# Patient Record
Sex: Male | Born: 1960 | Race: White | Hispanic: No | Marital: Single | Smoking: Never smoker
Health system: Southern US, Community
[De-identification: ages and names within clinical notes are randomized; demographics above are authoritative.]

## PROBLEM LIST (undated history)

## (undated) DIAGNOSIS — J690 Pneumonitis due to inhalation of food and vomit: Secondary | ICD-10-CM

## (undated) DIAGNOSIS — E1121 Type 2 diabetes mellitus with diabetic nephropathy: Secondary | ICD-10-CM

## (undated) DIAGNOSIS — E669 Obesity, unspecified: Secondary | ICD-10-CM

## (undated) DIAGNOSIS — I4901 Ventricular fibrillation: Secondary | ICD-10-CM

## (undated) DIAGNOSIS — E1140 Type 2 diabetes mellitus with diabetic neuropathy, unspecified: Secondary | ICD-10-CM

## (undated) DIAGNOSIS — G4733 Obstructive sleep apnea (adult) (pediatric): Secondary | ICD-10-CM

## (undated) DIAGNOSIS — E1149 Type 2 diabetes mellitus with other diabetic neurological complication: Secondary | ICD-10-CM

## (undated) DIAGNOSIS — R609 Edema, unspecified: Secondary | ICD-10-CM

## (undated) DIAGNOSIS — Z8619 Personal history of other infectious and parasitic diseases: Secondary | ICD-10-CM

## (undated) DIAGNOSIS — E11621 Type 2 diabetes mellitus with foot ulcer: Secondary | ICD-10-CM

## (undated) DIAGNOSIS — Z8719 Personal history of other diseases of the digestive system: Secondary | ICD-10-CM

## (undated) DIAGNOSIS — M869 Osteomyelitis, unspecified: Secondary | ICD-10-CM

## (undated) DIAGNOSIS — G629 Polyneuropathy, unspecified: Secondary | ICD-10-CM

## (undated) DIAGNOSIS — N183 Chronic kidney disease, stage 3 unspecified: Secondary | ICD-10-CM

## (undated) DIAGNOSIS — D696 Thrombocytopenia, unspecified: Secondary | ICD-10-CM

## (undated) DIAGNOSIS — K7581 Nonalcoholic steatohepatitis (NASH): Secondary | ICD-10-CM

## (undated) DIAGNOSIS — I255 Ischemic cardiomyopathy: Secondary | ICD-10-CM

## (undated) DIAGNOSIS — K766 Portal hypertension: Secondary | ICD-10-CM

## (undated) DIAGNOSIS — E11319 Type 2 diabetes mellitus with unspecified diabetic retinopathy without macular edema: Secondary | ICD-10-CM

## (undated) DIAGNOSIS — R600 Localized edema: Secondary | ICD-10-CM

## (undated) DIAGNOSIS — E875 Hyperkalemia: Secondary | ICD-10-CM

## (undated) DIAGNOSIS — K7460 Unspecified cirrhosis of liver: Secondary | ICD-10-CM

## (undated) DIAGNOSIS — K631 Perforation of intestine (nontraumatic): Secondary | ICD-10-CM

## (undated) DIAGNOSIS — R011 Cardiac murmur, unspecified: Secondary | ICD-10-CM

## (undated) DIAGNOSIS — L97509 Non-pressure chronic ulcer of other part of unspecified foot with unspecified severity: Secondary | ICD-10-CM

## (undated) DIAGNOSIS — Z87448 Personal history of other diseases of urinary system: Secondary | ICD-10-CM

## (undated) DIAGNOSIS — M47816 Spondylosis without myelopathy or radiculopathy, lumbar region: Secondary | ICD-10-CM

## (undated) DIAGNOSIS — Z972 Presence of dental prosthetic device (complete) (partial): Secondary | ICD-10-CM

## (undated) HISTORY — DX: Ventricular fibrillation: I49.01

## (undated) HISTORY — DX: Polyneuropathy, unspecified: G62.9

## (undated) HISTORY — DX: Edema, unspecified: R60.9

## (undated) HISTORY — DX: Type 2 diabetes mellitus with foot ulcer: E11.621

## (undated) HISTORY — DX: Type 2 diabetes mellitus with unspecified diabetic retinopathy without macular edema: E11.319

## (undated) HISTORY — DX: Localized edema: R60.0

## (undated) HISTORY — DX: Type 2 diabetes mellitus with diabetic nephropathy: E11.21

## (undated) HISTORY — DX: Cardiac murmur, unspecified: R01.1

## (undated) HISTORY — DX: Nonalcoholic steatohepatitis (NASH): K75.81

## (undated) HISTORY — DX: Ischemic cardiomyopathy: I25.5

## (undated) HISTORY — DX: Perforation of intestine (nontraumatic): K63.1

## (undated) HISTORY — DX: Non-pressure chronic ulcer of other part of unspecified foot with unspecified severity: L97.509

## (undated) HISTORY — DX: Osteomyelitis, unspecified: M86.9

## (undated) HISTORY — DX: Personal history of other infectious and parasitic diseases: Z86.19

## (undated) HISTORY — DX: Type 2 diabetes mellitus with other diabetic neurological complication: E11.49

## (undated) HISTORY — DX: Hyperkalemia: E87.5

## (undated) HISTORY — PX: TRANSTHORACIC ECHOCARDIOGRAM: SHX275

## (undated) HISTORY — DX: Thrombocytopenia, unspecified: D69.6

## (undated) HISTORY — DX: Unspecified cirrhosis of liver: K74.60

## (undated) HISTORY — DX: Chronic kidney disease, stage 3 (moderate): N18.3

## (undated) HISTORY — DX: Spondylosis without myelopathy or radiculopathy, lumbar region: M47.816

## (undated) HISTORY — DX: Type 2 diabetes mellitus with diabetic neuropathy, unspecified: E11.40

## (undated) HISTORY — DX: Chronic kidney disease, stage 3 unspecified: N18.30

## (undated) HISTORY — DX: Personal history of other diseases of the digestive system: Z87.19

## (undated) HISTORY — DX: Obesity, unspecified: E66.9

## (undated) HISTORY — DX: Pneumonitis due to inhalation of food and vomit: J69.0

## (undated) HISTORY — DX: Portal hypertension: K76.6

## (undated) HISTORY — DX: Obstructive sleep apnea (adult) (pediatric): G47.33

---

## 1999-12-20 HISTORY — PX: KNEE SURGERY: SHX244

## 1999-12-20 HISTORY — PX: KNEE ARTHROSCOPY: SHX127

## 2011-04-27 ENCOUNTER — Emergency Department (HOSPITAL_COMMUNITY)
Admission: EM | Admit: 2011-04-27 | Discharge: 2011-04-27 | Disposition: A | Discharge status: Home / Self Care | Payer: Worker's Compensation | Attending: Emergency Medicine | Admitting: Emergency Medicine

## 2011-04-27 ENCOUNTER — Emergency Department (HOSPITAL_COMMUNITY): Payer: Worker's Compensation

## 2011-04-27 DIAGNOSIS — M545 Low back pain, unspecified: Secondary | ICD-10-CM | POA: Insufficient documentation

## 2011-04-27 DIAGNOSIS — W010XXA Fall on same level from slipping, tripping and stumbling without subsequent striking against object, initial encounter: Secondary | ICD-10-CM | POA: Insufficient documentation

## 2011-04-27 DIAGNOSIS — Y9289 Other specified places as the place of occurrence of the external cause: Secondary | ICD-10-CM | POA: Insufficient documentation

## 2011-04-27 DIAGNOSIS — S335XXA Sprain of ligaments of lumbar spine, initial encounter: Secondary | ICD-10-CM | POA: Insufficient documentation

## 2011-06-30 ENCOUNTER — Ambulatory Visit (INDEPENDENT_AMBULATORY_CARE_PROVIDER_SITE_OTHER): Payer: Private Health Insurance - Indemnity | Admitting: Family Medicine

## 2011-06-30 ENCOUNTER — Encounter: Payer: Self-pay | Admitting: Family Medicine

## 2011-06-30 VITALS — BP 185/93 | HR 74 | Ht 70.75 in | Wt 193.0 lb

## 2011-06-30 DIAGNOSIS — S39012A Strain of muscle, fascia and tendon of lower back, initial encounter: Secondary | ICD-10-CM | POA: Insufficient documentation

## 2011-06-30 DIAGNOSIS — R011 Cardiac murmur, unspecified: Secondary | ICD-10-CM

## 2011-06-30 DIAGNOSIS — I10 Essential (primary) hypertension: Secondary | ICD-10-CM

## 2011-06-30 DIAGNOSIS — S335XXA Sprain of ligaments of lumbar spine, initial encounter: Secondary | ICD-10-CM

## 2011-06-30 LAB — COMPREHENSIVE METABOLIC PANEL
BUN: 13 mg/dL (ref 6–23)
CO2: 28 mEq/L (ref 19–32)
Creat: 0.58 mg/dL (ref 0.50–1.35)
Glucose, Bld: 365 mg/dL — ABNORMAL HIGH (ref 70–99)
Total Bilirubin: 1.3 mg/dL — ABNORMAL HIGH (ref 0.3–1.2)
Total Protein: 6.5 g/dL (ref 6.0–8.3)

## 2011-06-30 LAB — LIPID PANEL
HDL: 62 mg/dL (ref >39)
Total CHOL/HDL Ratio: 2.9 Ratio
Triglycerides: 58 mg/dL (ref <150)

## 2011-06-30 LAB — TSH: TSH: 2.987 u[IU]/mL (ref 0.350–4.500)

## 2011-06-30 MED ORDER — NEBIVOLOL HCL 10 MG PO TABS: 10.0000 mg | ORAL_TABLET | Freq: Every day | ORAL | Status: DC

## 2011-06-30 NOTE — Patient Instructions (Signed)
BP goal is <140/90.

## 2011-06-30 NOTE — Assessment & Plan Note (Signed)
Minimal impairment currently, wants to go back to work.   I wrote a letter authorizing return to work w/out restriction today.   I did recommend he stop his NSAIDs and use tylenol only to see if his bp gets better off these meds. I recommended more PT and ordered this today.

## 2011-06-30 NOTE — Assessment & Plan Note (Addendum)
BP 164/92 on recheck (both arms) today.   Lipid panel through employer 2 mo ago was normal. Possibly some NSAID effect plus anxiety.  I recommended he get home bp cuff, check bp over the next 2d ---stop NSAIDs now--and if still up in 2d I recommended He start the bystolic 10mg  samples I gave him today: 1 qd.  Bring bp log in to f/u appt in 2 wks.  Therapeutic expectations and side effect profile of medication discussed today.  Patient's questions answered.

## 2011-06-30 NOTE — Progress Notes (Signed)
Office Note 06/30/2011  CC:  Chief Complaint  Patient presents with  . Back Pain    fell on 04/27/11, still having pain.  Released to full duty on 06/05/11 and has been unable to perform normal duties.  Pt is on leave from work until released back to work from PCP    HPI:  [name] is a 50 y.o. White male who is here to establish care and discuss back pain. Patient's most recent primary MD: none. Old records from [facility] ED visit 04/27/11 were reviewed prior to or during today's visit.  Also reviewed some health/screening info done through his employer a couple of months ago.  Here for back pain.  Was sent home by employer and told to go to MD about his back before coming back to work. Apparently fell while at work 04/27/11 and hurt back, went to ED for eval and x-ray confirmed some chronic changes c/w degenerative arthritis but no acute problems. He was rx'd vicodin and flexeril short term: these caused too much GI upset so he d/c'd them.  Switched to naproxen alt/w advil: 3-4 doses total per day.  Went to PT for about a month and was released to work without restrictions in mid/late June.  Since being back at work he has been doing fine, tolerating a low amount of pain and having no problems doing his usual exertional/physical labor.  This was the case until a few days ago when he felt a low back strain again from lifting some heavy things while at work.  He continued to work but let his employer know that he was having some pain again----they told him to get seen by MD so he is here today. No history of back surgery or injections.  He remembers some brief low back pain in 2000-2001 time range when he got an x-ray or MRI and was told he had a couple of bulging discs and some arthritis.  He didn't have any problems from his back since that time, however, until his fall at work 04/27/11.  Denies hx of CAD, lung dz, liver dz, or renal dz. No hx of depression.  Denies past hx of HTN.  Past Medical  History  Diagnosis Date  . Low back pain     Plain film 04/2011: spondylosis/old osteophyte vs endplate fx L3    Past Surgical History  Procedure Date  . Knee arthroscopy     Bilateral    History reviewed. No pertinent family history.  History   Social History  . Marital Status: Single    Spouse Name: N/A    Number of Children: N/A  . Years of Education: N/A   Occupational History  . Not on file.   Social History Main Topics  . Smoking status: Never Smoker   . Smokeless tobacco: Current User    Types: Chew  . Alcohol Use: 6.0 oz/week    10 Cans of beer per week  . Drug Use: No  . Sexually Active: Not on file   Other Topics Concern  . Not on file   Social History Narrative   Single, no children.  Lives in [place], relocated from [place], [place] in 2008.Chews tobacco but does not smoke.Drinks 1-2 beers most days.  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    Outpatient Encounter Prescriptions as of 06/30/2011  Medication Sig Dispense Refill  . Multiple Vitamins-Minerals (MULTI FOR HIM) PACK Take 1 each by mouth  daily.        . naproxen (NAPROSYN) 250 MG tablet Take 250 mg by mouth 2 (two) times daily with a meal.        . nebivolol (BYSTOLIC) 10 MG tablet Take 1 tablet (10 mg total) by mouth daily.  21 tablet  0  *PATIENT IS NOT ON BYSTOLIC (THIS WAS STARTED AT THE END OF THIS ENCOUNTER)  No Known Allergies  ROS Review of Systems  Constitutional: Negative for fever, chills, appetite change and fatigue.  HENT: Negative for ear pain, congestion, sore throat, neck stiffness and dental problem.   Eyes: Negative for discharge, redness and visual disturbance.  Respiratory: Negative for cough, chest tightness, shortness of breath and wheezing.   Cardiovascular: Negative for chest pain, palpitations and leg swelling.  Gastrointestinal: Negative for nausea, vomiting, abdominal pain, diarrhea and blood in stool.    Genitourinary: Negative for dysuria, urgency, frequency, hematuria, flank pain and difficulty urinating.  Musculoskeletal: Positive for back pain. Negative for myalgias, joint swelling and arthralgias.  Skin: Negative for pallor and rash.  Neurological: Negative for dizziness, speech difficulty, weakness and headaches.  Hematological: Negative for adenopathy. Does not bruise/bleed easily.  Psychiatric/Behavioral: Negative for confusion and sleep disturbance. The patient is not nervous/anxious.     PE; Blood pressure 185/93, pulse 74, height 5' 10.75" (1.797 m), weight 193 lb (87.544 kg), SpO2 100.00%. Gen: Alert, well appearing.  Patient is oriented to person, place, time, and situation. HEENT: Scalp without lesions or hair loss.  Ears: EACs clear, normal epithelium.  TMs with good light reflex and landmarks bilaterally.  Eyes: no injection, icteris, swelling, or exudate.  EOMI, PERRLA. Nose: no drainage or turbinate edema/swelling.  No injection or focal lesion.  Mouth: lips without lesion/swelling.  Oral mucosa pink and moist.  Dentition intact and without obvious caries or gingival swelling.  Oropharynx without erythema, exudate, or swelling.  Neck: supple, ROM full.  Carotids 2+ bilat, without bruit.  No lymphadenopathy, thyromegaly, or mass. Chest: symmetric expansion, nonlabored respirations.  Clear and equal breath sounds in all lung fields.   CV: RRR, 1/6 diastolic murmur at LUSB, S1 and S2 normal.  No rub or gallop.2  Peripheral pulses 2+ and symmetric. ABD: soft, NT, ND, BS normal.  No hepatospenomegaly or mass.  No bruits. EXT: no clubbing or cyanosis.  Both lower legs with 1 + pitting edema to mid tibia level plus pigmentation and hyperkeratosis c/w chronic venous stasis edema.  No erythema, tenderness, or skin breakdown.  Pertinent labs:  12 lead EKG: NSR, rate 76, poor R wave progression, rightward axis, intraventric cond delay.  No ischemic changes. No old EKG for  comparison.  ASSESSMENT AND PLAN:   Low back strain Minimal impairment currently, wants to go back to work.   I wrote a letter authorizing return to work w/out restriction today.   I did recommend he stop his NSAIDs and use tylenol only to see if his bp gets better off these meds. I recommended more PT and ordered this today.  HTN (hypertension), benign BP 164/92 on recheck (both arms) today.   Lipid panel through employer 2 mo ago was normal. Possibly some NSAID effect plus anxiety.  I recommended he get home bp cuff, check bp over the next 2d ---stop NSAIDs now--and if still up in 2d I recommended He start the bystolic 10mg  samples I gave him today: 1 qd.  Bring bp log in to f/u appt in 2 wks.  Therapeutic expectations and side effect profile  of medication discussed today.  Patient's questions answered.   Heart murmur Very soft diastolic murmur. EKG abnl but no ischemic changes. Will start with 2-D echo and will likely refer to cardiology after this. Discuss again with pt at f/u in 2 wks.   Will obtain fasting CBC, CMET, TSH, and lipid panel today.  Return in about 2 weeks (around 07/14/2011) for f/u back pain, hypertension, and heart murmur.

## 2011-06-30 NOTE — Assessment & Plan Note (Signed)
Very soft diastolic murmur. EKG abnl but no ischemic changes. Will start with 2-D echo and will likely refer to cardiology after this. Discuss again with pt at f/u in 2 wks.

## 2011-07-01 ENCOUNTER — Encounter: Payer: Self-pay | Admitting: Family Medicine

## 2011-07-01 LAB — CBC WITH DIFFERENTIAL/PLATELET
Eosinophils Relative: 1 % (ref 0–5)
HCT: 44.5 % (ref 39.0–52.0)
Hemoglobin: 15.6 g/dL (ref 13.0–17.0)
Lymphocytes Relative: 16 % (ref 12–46)
Lymphs Abs: 0.7 10*3/uL (ref 0.7–4.0)
MCV: 88.1 fL (ref 78.0–100.0)
Monocytes Relative: 7 % (ref 3–12)
Platelets: 65 10*3/uL — ABNORMAL LOW (ref 150–400)
RBC: 5.05 MIL/uL (ref 4.22–5.81)
WBC: 4.5 10*3/uL (ref 4.0–10.5)

## 2011-07-12 ENCOUNTER — Other Ambulatory Visit: Payer: Self-pay | Admitting: Family Medicine

## 2011-07-12 DIAGNOSIS — D696 Thrombocytopenia, unspecified: Secondary | ICD-10-CM

## 2011-07-12 DIAGNOSIS — R739 Hyperglycemia, unspecified: Secondary | ICD-10-CM

## 2011-07-12 NOTE — Progress Notes (Signed)
OK.  Orders for labs are in now.

## 2011-07-12 NOTE — Progress Notes (Signed)
Addended by: Luisa Dago on: 07/12/2011 08:52 AM   Modules accepted: Orders

## 2011-07-14 ENCOUNTER — Encounter: Payer: Self-pay | Admitting: Family Medicine

## 2011-07-14 ENCOUNTER — Ambulatory Visit (INDEPENDENT_AMBULATORY_CARE_PROVIDER_SITE_OTHER): Payer: Worker's Compensation | Admitting: Family Medicine

## 2011-07-14 DIAGNOSIS — R011 Cardiac murmur, unspecified: Secondary | ICD-10-CM

## 2011-07-14 DIAGNOSIS — I10 Essential (primary) hypertension: Secondary | ICD-10-CM

## 2011-07-14 DIAGNOSIS — S335XXA Sprain of ligaments of lumbar spine, initial encounter: Secondary | ICD-10-CM

## 2011-07-14 DIAGNOSIS — IMO0001 Reserved for inherently not codable concepts without codable children: Secondary | ICD-10-CM

## 2011-07-14 DIAGNOSIS — D696 Thrombocytopenia, unspecified: Secondary | ICD-10-CM

## 2011-07-14 LAB — CBC WITH DIFFERENTIAL/PLATELET
Basophils Relative: 1.4 % (ref 0.0–3.0)
Hemoglobin: 15.1 g/dL (ref 13.0–17.0)
Lymphocytes Relative: 16 % (ref 12.0–46.0)
Monocytes Relative: 9.5 % (ref 3.0–12.0)
Neutro Abs: 3.2 10*3/uL (ref 1.4–7.7)
RBC: 4.81 Mil/uL (ref 4.22–5.81)

## 2011-07-14 MED ORDER — PIOGLITAZONE HCL-METFORMIN HCL 15-500 MG PO TABS: 1.0000 | ORAL_TABLET | Freq: Two times a day (BID) | ORAL | Status: DC

## 2011-07-14 MED ORDER — GLUCOSE BLOOD VI STRP: ORAL_STRIP | Status: DC

## 2011-07-14 MED ORDER — FREESTYLE LANCETS MISC: Status: DC

## 2011-07-14 NOTE — Progress Notes (Addendum)
OFFICE NOTE  07/14/2011  CC:  Chief Complaint  Patient presents with  . Back Pain  . Hypertension  . Heart Murmur     HPI:   Patient is [name] who is here for f/u low back pain, HTN, and recently detected hyperglycemia and thrombocytopenia on routine lab work.  Also, we have ordered an echocardiogram to assess a diastolic murmur and he has not had this procedure yet. BP checks at home with wrist cuff showed gradually decreasing bps after he stopped taking NSAIDs, and since about 1 week ago all checks have been <140/90.  He did not start the bystolic samples I gave him. Back pain stable, mild.  He is working without difficulty and is taking part in PT.  Takes tylenol prn for pain.  When I brought up his high glucose from 7/12 labs, he admits he was dx'd with DM 2 in his late 19s when he was obese.  He was put on meds for a while and he lost a significant amount of weight.  He then stopped the meds and monitors glucoses at home infrequently.  Last one he recalls was 12/2010 and it was 189.  Also, lab work via his employer (reviewed today in office) showed at HbA1c of 11.3%.  He did not act on this and was told nothing specific from the agency that did his testing.  Regarding low platelet count on labs 06/30/11, he denies any known past hx of this.  No known hx of liver or splenic enlargement/congestion.  Overall, he talks a lot today about being mentally fatigued lately b/c of a lot of life situations lately: family visiting lately, some job troubles stressing him out, sleep has been poor, he's worried about his health.  Denies depressed mood. ROS: no polyuria or polydipsia, no CP or SOB, no leg pain or weakness or tingling/numbness, no HAs or palpitations.  Pertinent PMH:  HTN--seems to have been NSAID-induced.  Also has white-coat component. Low back pain/strain Diastolic heart murmur. DM 2, poor control  MEDS;  NOTE: not taking bystolic Outpatient Prescriptions Prior  to Visit  Medication Sig Dispense Refill  . Multiple Vitamins-Minerals (MULTI FOR HIM) PACK Take 1 each by mouth daily.        . naproxen (NAPROSYN) 250 MG tablet Take 250 mg by mouth 2 (two) times daily with a meal.        . nebivolol (BYSTOLIC) 10 MG tablet Take 1 tablet (10 mg total) by mouth daily.  21 tablet  0    PE: Blood pressure 162/82, pulse 68, height 5' 10.75" (1.797 m), weight 191 lb (86.637 kg). Gen: Alert, well appearing.  Patient is oriented to person, place, time, and situation. No further exam today.  Recent labs:  Lab Results  Component Value Date   WBC 4.5 06/30/2011   HGB 15.6 06/30/2011   HCT 44.5 06/30/2011   MCV 88.1 06/30/2011   PLT 65* 06/30/2011     Chemistry      Component Value Date/Time   NA 137 06/30/2011 0932   K 4.4 06/30/2011 0932   CL 98 06/30/2011 0932   CO2 28 06/30/2011 0932   BUN 13 06/30/2011 0932   CREATININE 0.58 06/30/2011 0932      Component Value Date/Time   CALCIUM 9.1 06/30/2011 0932   ALKPHOS 179* 06/30/2011 0932   AST 32 06/30/2011 0932   ALT 39 06/30/2011 0932   BILITOT 1.3* 06/30/2011 0932     Glucose 06/30/11 was >  350.  IMPRESSION AND PLAN:  HTN (hypertension), benign Normalized off of NSAIDs. Avoid NSAIDs, continue to monitor bp at home and call if persistently >140/90.  Low back strain Stable.  Continue tylenol and PT.  Type II or unspecified type diabetes mellitus without mention of complication, uncontrolled Check HbA1c today. Start actos plus met 500/15, 1 tab bid--samples given today.  Therapeutic expectations and side effect profile of medication discussed today.  Patient's questions answered. Monitor glucose fasting daily.  New glucometer given today, strips and lancets ordered. Will do urine microalb next visit, plus refer for diab retinop screening and do foot exam.  Thrombocytopenia New, unknown etiology. No culprit meds.  No recent acute illness.  No bleeding. Will repeat CBC today and will order abd u/s to  assess liver and spleen. May ultimately need referral to hem/onc.  Heart murmur Echo ordered 06/30/11 but apparently fell through the cracks and will be set up ASAP.     FOLLOW UP:  Return in about 2 weeks (around 07/28/2011) for f/u dm 2 and low platelets.

## 2011-07-14 NOTE — Assessment & Plan Note (Signed)
Check HbA1c today. Start actos plus met 500/15, 1 tab bid--samples given today.  Therapeutic expectations and side effect profile of medication discussed today.  Patient's questions answered. Monitor glucose fasting daily.  New glucometer given today, strips and lancets ordered. Will do urine microalb next visit, plus refer for diab retinop screening and do foot exam.

## 2011-07-14 NOTE — Assessment & Plan Note (Signed)
Echo ordered 06/30/11 but apparently fell through the cracks and will be set up ASAP.

## 2011-07-14 NOTE — Assessment & Plan Note (Signed)
Normalized off of NSAIDs. Avoid NSAIDs, continue to monitor bp at home and call if persistently >140/90.

## 2011-07-14 NOTE — Assessment & Plan Note (Signed)
New, unknown etiology. No culprit meds.  No recent acute illness.  No bleeding. Will repeat CBC today and will order abd u/s to assess liver and spleen. May ultimately need referral to hem/onc.

## 2011-07-14 NOTE — Assessment & Plan Note (Signed)
Stable.  Continue tylenol and PT.

## 2011-07-15 ENCOUNTER — Other Ambulatory Visit: Payer: Self-pay | Admitting: Family Medicine

## 2011-07-15 ENCOUNTER — Ambulatory Visit (HOSPITAL_BASED_OUTPATIENT_CLINIC_OR_DEPARTMENT_OTHER)
Admission: RE | Admit: 2011-07-15 | Discharge: 2011-07-15 | Disposition: A | Discharge status: Home / Self Care | Payer: Managed Care, Other (non HMO) | Source: Ambulatory Visit | Attending: Family Medicine | Admitting: Family Medicine

## 2011-07-15 DIAGNOSIS — D696 Thrombocytopenia, unspecified: Secondary | ICD-10-CM

## 2011-07-18 ENCOUNTER — Other Ambulatory Visit (HOSPITAL_COMMUNITY): Payer: Self-pay | Admitting: Radiology

## 2011-07-18 DIAGNOSIS — R011 Cardiac murmur, unspecified: Secondary | ICD-10-CM

## 2011-07-19 ENCOUNTER — Other Ambulatory Visit (HOSPITAL_COMMUNITY): Payer: Self-pay | Admitting: Radiology

## 2011-07-27 ENCOUNTER — Ambulatory Visit (INDEPENDENT_AMBULATORY_CARE_PROVIDER_SITE_OTHER): Payer: Private Health Insurance - Indemnity | Admitting: Family Medicine

## 2011-07-27 ENCOUNTER — Other Ambulatory Visit (HOSPITAL_COMMUNITY): Payer: Self-pay | Admitting: Radiology

## 2011-07-27 ENCOUNTER — Encounter: Payer: Self-pay | Admitting: Family Medicine

## 2011-07-27 DIAGNOSIS — I10 Essential (primary) hypertension: Secondary | ICD-10-CM

## 2011-07-27 DIAGNOSIS — R319 Hematuria, unspecified: Secondary | ICD-10-CM

## 2011-07-27 DIAGNOSIS — IMO0001 Reserved for inherently not codable concepts without codable children: Secondary | ICD-10-CM

## 2011-07-27 LAB — POCT URINALYSIS DIPSTICK
Glucose, UA: 500
Ketones, UA: NEGATIVE
Spec Grav, UA: 1.01

## 2011-07-27 MED ORDER — LISINOPRIL 2.5 MG PO TABS: 2.5000 mg | ORAL_TABLET | Freq: Every day | ORAL | Status: DC

## 2011-07-27 MED ORDER — TRAMADOL HCL 50 MG PO TABS: 50.0000 mg | ORAL_TABLET | Freq: Four times a day (QID) | ORAL | Status: AC | PRN

## 2011-07-27 MED ORDER — CEFDINIR 300 MG PO CAPS: 300.0000 mg | ORAL_CAPSULE | Freq: Two times a day (BID) | ORAL | Status: AC

## 2011-07-27 NOTE — Assessment & Plan Note (Signed)
He is tolerating his Actoplusmet 15/500 bid he will continue this dose until seen by PMD next week. Minimize simple carbs

## 2011-07-27 NOTE — Assessment & Plan Note (Signed)
Improved number on repeat and patient reporting home numbers of 114/67. Will start him on a low dose of Lisinopril 2.5mg  po daily and reassess.

## 2011-07-27 NOTE — Patient Instructions (Addendum)
Urinary Tract Infection (UTI) Infections of the urinary tract can start in several places. A bladder infection (cystitis), a kidney infection (pyelonephritis), and a prostate infection (prostatitis) are different types of urinary tract infections. They usually get better if treated with medicines (antibiotics) that kill germs. Take all the medicine until it is gone. You or your child may feel better in a few days, but TAKE ALL MEDICINE or the infection may not respond and may become more difficult to treat. HOME CARE INSTRUCTIONS  Drink enough water and fluids to keep the urine clear or pale yellow. Cranberry juice is especially recommended, in addition to large amounts of water.   Avoid caffeine, tea, and carbonated beverages. They tend to irritate the bladder.   Alcohol may irritate the prostate.   Only take over-the-counter or prescription medicines for pain, discomfort, or fever as directed by your caregiver.  FINDING OUT THE RESULTS OF YOUR TEST Not all test results are available during your visit. If your or your child's test results are not back during the visit, make an appointment with your caregiver to find out the results. Do not assume everything is normal if you have not heard from your caregiver or the medical facility. It is important for you to follow up on all test results. TO PREVENT FURTHER INFECTIONS:  Empty the bladder often. Avoid holding urine for long periods of time.   After a bowel movement, women should cleanse from front to back. Use each tissue only once.   Empty the bladder before and after sexual intercourse.  SEEK MEDICAL CARE IF:  There is back pain.   You or your child has an oral temperature above 104.   Your baby is older than 3 months with a rectal temperature of 100.5 F (38.1 C) or higher for more than 1 day.   Your or your child's problems (symptoms) are no better in 3 days. Return sooner if you or your child is getting worse.  SEEK IMMEDIATE  MEDICAL CARE IF:  There is severe back pain or lower abdominal pain.   You or your child develops chills.   You or your child has an oral temperature above 104, not controlled by medicine.   Your baby is older than 3 months with a rectal temperature of 102 F (38.9 C) or higher.   Your baby is 67 months old or younger with a rectal temperature of 100.4 F (38 C) or higher.   There is nausea or vomiting.   There is continued burning or discomfort with urination.  MAKE SURE YOU:  Understand these instructions.   Will watch this condition.   Will get help right away if you or your child is not doing well or gets worse.  Document Released: 09/14/2005 Document Re-Released: 03/01/2010 [facility] Patient Information 2011 [place], [place].   The Tramadol is for severe pain and may cause sedation so only take at home, at night

## 2011-07-27 NOTE — Progress Notes (Signed)
[name] [id number] [birth year] 07/27/2011      Progress Note-Follow Up  Subjective  Chief Complaint  Chief Complaint  Patient presents with  . Pain    left side- groin area X 6 days  . Hematuria    HPI  Patient is a 50 year old Caucasian male who is in today with complaints of abdominal pain and hematuria. 6 days ago he was at work and his first meal of the day and about half an hour after that developed some intense periumbilical pain. He had no appetite and his cramping in his abdomen became generalized and more intense. He was home from work and slept for several hours, he felt exhausted and was struggling with anorexia. When he woke up the pain was improving but still present he did show some nausea but never had any vomiting. He never had any obvious fevers or chills. He did have his normal 2 bowel movements daily nothing was bloody or tarry. Over the next 24 hours his nausea and abdominal pain slowly improved but then the next day when he was urinating he noted some urgency and frequency and saw 2 specks of red blood when he wiped after urinating. Over the last couple of days he developed some left lower quadrant discomfort and some dark and more tea-colored urine also developed. He continues to struggle some urinary urgency and frequency but denies actual dysuria. Denies any flank tenderness. No anorexia nausea or malaise is noted presently reports his blood pressure when checked at home earlier was 114/67 and his blood sugars have been improving on Actoplusmet over the last few days.  Past Medical History  Diagnosis Date  . Low back pain 04/2011    Plain film 04/2011: spondylosis/old osteophyte vs endplate fx L3  . Abnormal EKG   . Heart murmur     Diastolic (06/30/11)  . Hypertension 06/2011  . Hematuria 07/27/2011    Past Surgical History  Procedure Date  . Knee arthroscopy     Bilateral    History reviewed. No pertinent family history.  History   Social History  .  Marital Status: Single    Spouse Name: N/A    Number of Children: N/A  . Years of Education: N/A   Occupational History  . Not on file.   Social History Main Topics  . Smoking status: Never Smoker   . Smokeless tobacco: Current User    Types: Chew  . Alcohol Use: 6.0 oz/week    10 Cans of beer per week  . Drug Use: No  . Sexually Active: Not on file   Other Topics Concern  . Not on file   Social History Narrative   Single, no children.  Lives in [place], relocated from [place], [place] in 2008.Chews tobacco but does not smoke.Drinks 1-2 beers most days.  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    Current Outpatient Prescriptions on File Prior to Visit  Medication Sig Dispense Refill  . glucose blood (FREESTYLE LITE) test strip PRN  32 each  12  . Lancets (FREESTYLE) lancets PRN  100 each  5  . Multiple Vitamins-Minerals (MULTI FOR HIM) PACK Take 1 each by mouth daily.        . pioglitazone-metformin (ACTOPLUS MET) 15-500 MG per tablet Take 1 tablet by mouth 2 (two) times daily with a meal.  60 tablet  0    Allergies  Allergen Reactions  . Naproxen Nausea Only  Review of Systems  Review of Systems  Constitutional: Negative for fever and malaise/fatigue.  HENT: Negative for congestion.   Eyes: Negative for discharge.  Respiratory: Negative for shortness of breath.   Cardiovascular: Negative for chest pain, palpitations and leg swelling.  Gastrointestinal: Negative for nausea, abdominal pain and diarrhea.  Genitourinary: Negative for dysuria.  Musculoskeletal: Negative for falls.  Skin: Negative for rash.  Neurological: Negative for loss of consciousness and headaches.  Endo/Heme/Allergies: Negative for polydipsia.  Psychiatric/Behavioral: Negative for depression and suicidal ideas. The patient is not nervous/anxious and does not have insomnia.     Objective  BP 160/78  Pulse 94  Temp(Src) 98.6 F (37 C)  (Oral)  Ht 5' 10.5" (1.791 m)  Wt 197 lb 12.8 oz (89.721 kg)  BMI 27.98 kg/m2  SpO2 97%  Physical Exam  Physical Exam  Constitutional: He is oriented to person, place, and time and well-developed, well-nourished, and in no distress. No distress.  HENT:  Head: Normocephalic and atraumatic.  Eyes: Conjunctivae are normal.  Neck: Neck supple. No thyromegaly present.  Cardiovascular: Normal rate, regular rhythm and normal heart sounds.   No murmur heard. Pulmonary/Chest: Effort normal and breath sounds normal. No respiratory distress.  Abdominal: He exhibits no distension and no mass. There is no tenderness.  Musculoskeletal: He exhibits no edema.  Neurological: He is alert and oriented to person, place, and time.  Skin: Skin is warm.  Psychiatric: Memory, affect and judgment normal.    Lab Results  Component Value Date   TSH 2.987 06/30/2011   Lab Results  Component Value Date   WBC 4.5 07/14/2011   HGB 15.1 07/14/2011   HCT 44.1 07/14/2011   MCV 91.8 07/14/2011   PLT 52.0* 07/14/2011   Lab Results  Component Value Date   CREATININE 0.58 06/30/2011   BUN 13 06/30/2011   NA 137 06/30/2011   K 4.4 06/30/2011   CL 98 06/30/2011   CO2 28 06/30/2011   Lab Results  Component Value Date   ALT 39 06/30/2011   AST 32 06/30/2011   ALKPHOS 179* 06/30/2011   BILITOT 1.3* 06/30/2011   Lab Results  Component Value Date   CHOL 177 06/30/2011   Lab Results  Component Value Date   HDL 62 06/30/2011   Lab Results  Component Value Date   LDLCALC 103* 06/30/2011   Lab Results  Component Value Date   TRIG 58 06/30/2011   Lab Results  Component Value Date   CHOLHDL 2.9 06/30/2011     Assessment & Plan  HTN (hypertension), benign Improved number on repeat and patient reporting home numbers of 114/67. Will start him on a low dose of Lisinopril 2.5mg  po daily and reassess.  Hematuria Patient with some LLQ pain and some urinary frequency and hematuria, will have him strain his urine  once we get a strain. He will push clear fluids and we will send urine for culture and treat UTI empirically with Cefdinir 300mg  po bid. He is given a small amount of Tramadol to use qhs prn is pain increases. He will not use this while he is working but will instead continue with Tylenol prn  Type II or unspecified type diabetes mellitus without mention of complication, uncontrolled He is tolerating his Actoplusmet 15/500 bid he will continue this dose until seen by PMD next week. Minimize simple carbs

## 2011-07-27 NOTE — Assessment & Plan Note (Signed)
Patient with some LLQ pain and some urinary frequency and hematuria, will have him strain his urine once we get a strain. He will push clear fluids and we will send urine for culture and treat UTI empirically with Cefdinir 300mg  po bid. He is given a small amount of Tramadol to use qhs prn is pain increases. He will not use this while he is working but will instead continue with Tylenol prn

## 2011-07-28 ENCOUNTER — Telehealth: Payer: Self-pay | Admitting: Family Medicine

## 2011-07-28 NOTE — Telephone Encounter (Signed)
Message copied by Carmelia Bake on Thu Jul 28, 2011  1:05 PM ------      Message from: [name], [place] [facility]      Created: Thu Jul 28, 2011 10:11 AM      Regarding: echo       Please make sure the echo I ordered for him doesn't fall through the cracks (I can't tell from looking in EPIC that it has been scheduled).  Thanks--PM

## 2011-07-28 NOTE — Telephone Encounter (Signed)
The patient was scheduled for the echo 07/19/11 at 11:30 which was rescheduled for 3:00 that same day. He was a no show. Then the appt was scheduled for yesterday 07/27/11 at 4:00 and the patient called and cancelled at 4:01. The patient did see Dr Abner Greenspan at 3:30 yesterday.

## 2011-07-30 LAB — URINE CULTURE: Colony Count: >=100000

## 2011-08-02 ENCOUNTER — Ambulatory Visit: Payer: Self-pay | Admitting: Hematology & Oncology

## 2011-08-05 ENCOUNTER — Encounter: Payer: Self-pay | Admitting: Family Medicine

## 2011-08-05 ENCOUNTER — Ambulatory Visit (INDEPENDENT_AMBULATORY_CARE_PROVIDER_SITE_OTHER): Payer: Private Health Insurance - Indemnity | Admitting: Family Medicine

## 2011-08-05 VITALS — BP 148/72 | HR 68 | Temp 97.8°F | Wt 194.0 lb

## 2011-08-05 DIAGNOSIS — D696 Thrombocytopenia, unspecified: Secondary | ICD-10-CM

## 2011-08-05 DIAGNOSIS — I10 Essential (primary) hypertension: Secondary | ICD-10-CM

## 2011-08-05 DIAGNOSIS — N390 Urinary tract infection, site not specified: Secondary | ICD-10-CM

## 2011-08-05 DIAGNOSIS — R011 Cardiac murmur, unspecified: Secondary | ICD-10-CM

## 2011-08-05 MED ORDER — PIOGLITAZONE HCL-METFORMIN HCL 15-500 MG PO TABS: ORAL_TABLET | ORAL | Status: DC

## 2011-08-05 NOTE — Assessment & Plan Note (Signed)
Improving. Increase actos+met 15/500 to 1 tab in AM and 2 tabs in PM.   F/u 3 mo for repeat HbA1c.

## 2011-08-05 NOTE — Assessment & Plan Note (Signed)
Asymptomatic but it is diastolic so I do want him to get echo. We'll arrange this again (scheduling problems initially).

## 2011-08-05 NOTE — Progress Notes (Addendum)
OFFICE NOTE  08/05/2011  CC:  Chief Complaint  Patient presents with  . Follow-up     HPI:   Patient is [name] who is here for f/u recent left groin pain with gross hematuria. Was given omnicef for presumed UTI and he began feeling well w/in 24-36h of starting the med. Feels well now, has 1-2 days of abx to finish. Taking lisinopril fine---recently started for mild HTN--no side effects.  No BPs from home to report. Missed echo that I ordered b/c of scheduling conflict.   Has some questions about the hem/onc referral that I ordered for him (new thrombocytopenia). He voices concern about multiple medical bills that he is behind on and wishes to minimize further evals for a while if possible.  Glucoses ranging 147-225 since starting actos+met 500mg  1 tab bid. He is running out of the samples I gave him.  Pertinent PMH:  DM 2 HTN Thrombocytopenia Diastolic heart murmur Low back strain UTI/hematuria  MEDS;   Outpatient Prescriptions Prior to Visit  Medication Sig Dispense Refill  . cefdinir (OMNICEF) 300 MG capsule Take 1 capsule (300 mg total) by mouth 2 (two) times daily.  14 capsule  0  . glucose blood (FREESTYLE LITE) test strip PRN  32 each  12  . Lancets (FREESTYLE) lancets PRN  100 each  5  . lisinopril (PRINIVIL,ZESTRIL) 2.5 MG tablet Take 1 tablet (2.5 mg total) by mouth daily.  30 tablet  2  . Multiple Vitamins-Minerals (MULTI FOR HIM) PACK Take 1 each by mouth daily.        . traMADol (ULTRAM) 50 MG tablet Take 1 tablet (50 mg total) by mouth every 6 (six) hours as needed for pain.  30 tablet  0  . pioglitazone-metformin (ACTOPLUS MET) 15-500 MG per tablet Take 1 tablet by mouth 2 (two) times daily with a meal.  60 tablet  0    PE: Blood pressure 148/72, pulse 68, temperature 97.8 F (36.6 C), temperature source Oral, weight 194 lb (87.998 kg). Gen: Alert, well appearing.  Patient is oriented to person, place, time, and situation. Chest:  symmetric expansion, nonlabored respirations.  Clear and equal breath sounds in all lung fields.   CV: RRR, very soft diastolic murmur at LUSB.  Peripheral pulses 2+ and symmetric.   IMPRESSION AND PLAN:  UTI (lower urinary tract infection) Vs kidney stone that passed quickly.  All symptoms have resolved.  He'll finish abx and I'll repeat a UA at next routine diabetes f/u in 75mo to make sure his hematuria has cleared.  Recent abd u/s showed normal kidneys.  Type II or unspecified type diabetes mellitus without mention of complication, uncontrolled Improving. Increase actos+met 15/500 to 1 tab in AM and 2 tabs in PM.   F/u 3 mo for repeat HbA1c.  HTN (hypertension), benign Stable.  Tolerating lisinopril well.  Continue.  Heart murmur Asymptomatic but it is diastolic so I do want him to get echo. We'll arrange this again (scheduling problems initially).  Thrombocytopenia Explained this to him again.  I do want him to see hem/onc for further eval for this.  He has medical bills that he is worried about keeping up with right now and wishes to put the referral off for the time being.  I'm ok with delaying it for now.  F/u CBC in 75mo at next routine DM 2 f/u.    FOLLOW UP:  Return in about 3 months (around 11/05/2011) for f/u DM2 and HTN.

## 2011-08-05 NOTE — Assessment & Plan Note (Signed)
Vs kidney stone that passed quickly.  All symptoms have resolved.  He'll finish abx and I'll repeat a UA at next routine diabetes f/u in 39mo to make sure his hematuria has cleared.  Recent abd u/s showed normal kidneys.

## 2011-08-05 NOTE — Assessment & Plan Note (Signed)
Stable.  Tolerating lisinopril well.  Continue.

## 2011-08-05 NOTE — Assessment & Plan Note (Signed)
Explained this to him again.  I do want him to see hem/onc for further eval for this.  He has medical bills that he is worried about keeping up with right now and wishes to put the referral off for the time being.  I'm ok with delaying it for now.  F/u CBC in 84mo at next routine DM 2 f/u.

## 2011-08-18 ENCOUNTER — Ambulatory Visit (HOSPITAL_COMMUNITY): Payer: Managed Care, Other (non HMO) | Attending: Family Medicine | Admitting: Radiology

## 2011-08-18 DIAGNOSIS — I359 Nonrheumatic aortic valve disorder, unspecified: Secondary | ICD-10-CM | POA: Insufficient documentation

## 2011-08-18 DIAGNOSIS — E119 Type 2 diabetes mellitus without complications: Secondary | ICD-10-CM | POA: Insufficient documentation

## 2011-08-18 DIAGNOSIS — R011 Cardiac murmur, unspecified: Secondary | ICD-10-CM

## 2011-08-19 ENCOUNTER — Encounter: Payer: Self-pay | Admitting: Family Medicine

## 2011-08-19 ENCOUNTER — Other Ambulatory Visit: Payer: Self-pay | Admitting: Family Medicine

## 2011-08-19 NOTE — Progress Notes (Signed)
Quick Note:  Discussed results with pt today on the phone. Recommended to continue lisinopril, monitor bp. We'll plan to repeat his echo in a year to follow mild AS. ---PM ______

## 2011-08-24 ENCOUNTER — Telehealth: Payer: Self-pay

## 2011-08-24 NOTE — Telephone Encounter (Signed)
Patient states he is going to the bathroom every 20 minutes (diarhea)? Pt would like to know what he could go to the store and buy? Per md try Imodium.  Pt informed and was told to stay well hydrated.

## 2011-08-25 ENCOUNTER — Encounter: Payer: Self-pay | Admitting: Family Medicine

## 2011-08-25 ENCOUNTER — Ambulatory Visit (HOSPITAL_COMMUNITY)
Admission: RE | Admit: 2011-08-25 | Discharge: 2011-08-25 | Disposition: A | Discharge status: Home / Self Care | Payer: Managed Care, Other (non HMO) | Source: Ambulatory Visit | Attending: Family Medicine | Admitting: Family Medicine

## 2011-08-25 ENCOUNTER — Ambulatory Visit (INDEPENDENT_AMBULATORY_CARE_PROVIDER_SITE_OTHER): Payer: Managed Care, Other (non HMO) | Admitting: Family Medicine

## 2011-08-25 ENCOUNTER — Other Ambulatory Visit: Payer: Self-pay | Admitting: Family Medicine

## 2011-08-25 DIAGNOSIS — D696 Thrombocytopenia, unspecified: Secondary | ICD-10-CM

## 2011-08-25 DIAGNOSIS — K6289 Other specified diseases of anus and rectum: Secondary | ICD-10-CM

## 2011-08-25 DIAGNOSIS — R339 Retention of urine, unspecified: Secondary | ICD-10-CM | POA: Insufficient documentation

## 2011-08-25 DIAGNOSIS — R319 Hematuria, unspecified: Secondary | ICD-10-CM

## 2011-08-25 DIAGNOSIS — M479 Spondylosis, unspecified: Secondary | ICD-10-CM | POA: Insufficient documentation

## 2011-08-25 DIAGNOSIS — M5126 Other intervertebral disc displacement, lumbar region: Secondary | ICD-10-CM | POA: Insufficient documentation

## 2011-08-25 DIAGNOSIS — R159 Full incontinence of feces: Secondary | ICD-10-CM

## 2011-08-25 DIAGNOSIS — M47817 Spondylosis without myelopathy or radiculopathy, lumbosacral region: Secondary | ICD-10-CM

## 2011-08-25 DIAGNOSIS — R209 Unspecified disturbances of skin sensation: Secondary | ICD-10-CM | POA: Insufficient documentation

## 2011-08-25 LAB — POCT URINALYSIS DIPSTICK
Protein, UA: 30
Spec Grav, UA: 1.02
Urobilinogen, UA: 4

## 2011-08-25 LAB — COMPREHENSIVE METABOLIC PANEL
ALT: 24 U/L (ref 0–53)
AST: 19 U/L (ref 0–37)
Albumin: 3 g/dL — ABNORMAL LOW (ref 3.5–5.2)
Alkaline Phosphatase: 158 U/L — ABNORMAL HIGH (ref 39–117)
Glucose, Bld: 306 mg/dL — ABNORMAL HIGH (ref 70–99)
Potassium: 4.2 mEq/L (ref 3.5–5.1)
Sodium: 135 mEq/L (ref 135–145)
Total Protein: 6.2 g/dL (ref 6.0–8.3)

## 2011-08-25 LAB — CBC WITH DIFFERENTIAL/PLATELET
Basophils Absolute: 0.1 10*3/uL (ref 0.0–0.1)
Eosinophils Absolute: 0.1 10*3/uL (ref 0.0–0.7)
Eosinophils Relative: 1.1 % (ref 0.0–5.0)
MCV: 91.3 fl (ref 78.0–100.0)
Monocytes Absolute: 1 10*3/uL (ref 0.1–1.0)
Neutrophils Relative %: 84 % — ABNORMAL HIGH (ref 43.0–77.0)
Platelets: 162 10*3/uL (ref 150.0–400.0)
RDW: 12.9 % (ref 11.5–14.6)
WBC: 12.2 10*3/uL — ABNORMAL HIGH (ref 4.5–10.5)

## 2011-08-25 NOTE — Assessment & Plan Note (Signed)
With urinary retention, decreased anal sphincter tone, vague LE sensory complaints, and acute onset of things in this patient with hx of L/S spine DJD, I feel it is imperative that I r/o cauda equina syndrome. MRI w/o contrast of L spine has been set for today at 4:45 at [facility] imaging.   Will check plain film of abdomen so see if any evidence of significant stool in colon (I felt none in rectal vault today). Regarding urine, I have sent specimen for urine microscopy and culture.  I have ordered a PSA. CBC, CMET, ESR also ordered today. No new meds recommended at this time. Will f/u with pt by phone with results of MRI and labs, plan for office f/u in 4d.

## 2011-08-25 NOTE — Progress Notes (Addendum)
OFFICE VISIT  08/25/2011   CC:  Chief Complaint  Patient presents with  . Constipation    began as diarrhea, now constipated, miralax not effective  . Urinary Tract Infection    dark urine, "hard to go"     HPI:    Patient is [name] who presents for problems having a BM. Reports feeling well and having no problems with constipation until 6d/a he had acute onset of strong urge to have BM but only passed some gas and small amount of brownish watery stool. Felt like more needed to come but it would not come.  He tried miralax qd x 2d but no effect.  This progressed to urge incontinence of brownish, watery fecal matter every 30 min or so, and around this time he also began to feel frequent urge to urinate but had difficulty getting urine to flow.  When it finally flowed, his urine output was normal although it has begun to look dark yellow/tea colored the last couple of days.  Today he has begun to feel decreased sensation in his legs from about the waist down, feels tingling in feet.  Denies LE weakness.  Denies abdominal pain.  Appetite/food intake has been normal, liquid intake normal.  No fever, no dysuria, no flank pain. He does report daily low back pain diffusely, describes squeezing pain centrally in L/S spine, without radiation of the pain into legs. He has not been on any pain meds lately. He injured his back playing baseball 04/2011 and x-rays at an ED visit showed lumbosacral spondylosis with an old endplate/osteophyte fracture.  He was seen here by Dr. Abner Greenspan about a month ago and had groin/side pain with some gross hematuria, was treated for UTI with omnicef and told to strain his urine.   He has not had f/u UA since then. He has hx of thrombocytopenia (plts in 50K range), has deferred hem/onc w/u until later date due to financial concerns.  Past Medical History  Diagnosis Date  . Low back pain 04/2011    Plain film 04/2011: spondylosis/old osteophyte vs endplate  fx L3  . Abnormal EKG   . Heart murmur     Diastolic (06/30/11): ECHO 07/2011 showed mild AS and LVH (but no problem to correlate with diastolic murmur).  . Hypertension 06/2011  . Hematuria 07/27/2011    Past Surgical History  Procedure Date  . Knee arthroscopy     Bilateral    Outpatient Prescriptions Prior to Visit  Medication Sig Dispense Refill  . glucose blood (FREESTYLE LITE) test strip PRN  32 each  12  . Lancets (FREESTYLE) lancets PRN  100 each  5  . lisinopril (PRINIVIL,ZESTRIL) 2.5 MG tablet Take 1 tablet (2.5 mg total) by mouth daily.  30 tablet  2  . Multiple Vitamins-Minerals (MULTI FOR HIM) PACK Take 1 each by mouth daily.        . pioglitazone-metformin (ACTOPLUS MET) 15-500 MG per tablet 1 tab po qAM and 2 tabs po qPM  90 tablet  3    Allergies  Allergen Reactions  . Naproxen Nausea Only    ROS As per HPI  PE: Blood pressure 159/80, pulse 80, temperature 98.1 F (36.7 C), temperature source Oral, weight 191 lb (86.637 kg). Gen: Alert, well appearing.  Patient is oriented to person, place, time, and situation. ABD: soft, nondistended, BS normal.  No bruit or mass.  Mild right lower quadrant TTP.  No guarding or rebound. Rectal: diminished sphincter tone, no  stool in vault, brownish stool wipings from glove were guaiac negative in office today. Prostate smooth, nontender.  No mass palpated. Testing of skin fine touch sensation in perineum diffusely was normal, as was fine touch sensory testing of LEs. Strength 5/5 prox/dist in both LEs.  I cannot elicit any DTRs in LE's or UEs.  Babinski testing : toes don't flex or extend.(bilat).  LABS:  CC UA today: trace intact blood, >160 ketones, 500 glucose, nit and LEU neg, 30 prot, SG 1.020  IMPRESSION AND PLAN:  Fecal incontinence With urinary retention, decreased anal sphincter tone, vague LE sensory complaints, and acute onset of things in this patient with hx of L/S spine DJD, I feel it is imperative that I r/o  cauda equina syndrome. MRI w/o contrast of L spine has been set for today at 4:45 at [facility] imaging.   Will check plain film of abdomen so see if any evidence of significant stool in colon (I felt none in rectal vault today). Regarding urine, I have sent specimen for urine microscopy and culture.  I have ordered a PSA. CBC, CMET, ESR also ordered today. No new meds recommended at this time. Will f/u with pt by phone with results of MRI and labs, plan for office f/u in 4d.     FOLLOW UP: Return in about 4 days (around 08/29/2011).

## 2011-08-26 ENCOUNTER — Ambulatory Visit (HOSPITAL_COMMUNITY)
Admission: RE | Admit: 2011-08-26 | Discharge: 2011-08-26 | Disposition: A | Discharge status: Home / Self Care | Payer: Managed Care, Other (non HMO) | Source: Ambulatory Visit | Attending: Family Medicine | Admitting: Family Medicine

## 2011-08-26 ENCOUNTER — Other Ambulatory Visit: Payer: Self-pay | Admitting: Family Medicine

## 2011-08-26 DIAGNOSIS — R159 Full incontinence of feces: Secondary | ICD-10-CM | POA: Insufficient documentation

## 2011-08-26 LAB — URINALYSIS, MICROSCOPIC ONLY
Casts: NONE SEEN
Crystals: NONE SEEN
Squamous Epithelial / LPF: NONE SEEN

## 2011-08-26 MED ORDER — TAMSULOSIN HCL 0.4 MG PO CAPS: ORAL_CAPSULE | ORAL | Status: DC

## 2011-08-26 NOTE — Progress Notes (Signed)
Quick Note:  Discussed result with patient. We'll still plan the cathartic regiment we already discussed, but I'll add flomax 0.4-0.8 mg qhs to help with his urinary retention. Discussed the fact that prostatitis/urinary retention may be the cause of all his symptoms. His Urine microscopy looked normal. Awaiting urine clx. Will possibly add abx when I see him for f/u in 3d. ______

## 2011-08-28 LAB — URINE CULTURE
Colony Count: NO GROWTH
Organism ID, Bacteria: NO GROWTH

## 2011-08-29 ENCOUNTER — Encounter: Payer: Self-pay | Admitting: Family Medicine

## 2011-08-29 ENCOUNTER — Ambulatory Visit (INDEPENDENT_AMBULATORY_CARE_PROVIDER_SITE_OTHER): Payer: Managed Care, Other (non HMO) | Admitting: Family Medicine

## 2011-08-29 VITALS — BP 148/76 | HR 84 | Temp 97.8°F | Wt 188.0 lb

## 2011-08-29 DIAGNOSIS — R339 Retention of urine, unspecified: Secondary | ICD-10-CM

## 2011-08-29 MED ORDER — CIPROFLOXACIN HCL 750 MG PO TABS: 750.0000 mg | ORAL_TABLET | Freq: Two times a day (BID) | ORAL | Status: DC

## 2011-08-29 NOTE — Assessment & Plan Note (Addendum)
Presumably due to acute prostatitis (vs new onset BPH?). Urine clx late last week was negative. Unfortunately he is not much better since I last saw him last week.  Plan to continue flomax 0.8 mg qhs and will start ciprofloxacin 750mg  bid x 14d. Will ask urology to see him.  For now, he'll stop all cathartics/laxatives except miralax 1 capful bid. F/u in office in 3d: note for work written today.

## 2011-08-29 NOTE — Progress Notes (Signed)
OFFICE NOTE  08/29/2011  CC:  Chief Complaint  Patient presents with  . multiple issues    urinary hesitancy, constipation     HPI:   Patient is [name] who is here for f/u difficulty having BM and difficulty urinating. Initally his presentation involved some vague LE/perineal neuro symptoms and back pain so I was worried about cauda equina syndrome. MRI L/S spine was reassuring.  PSA and other general lab work was unremarkable, urine clx negative, urine micro negative.  ESR was in the 60s.  We began treating for working dx of severe constipation with urinary retention coming on secondary to this.   Symptoms not much improved with flomax addition a few nights ago. Has tried enema--unable to keep it in rectum more than a minute. Suppository helped bring some gas and watery stool.  Appetite remains good, food/drink intake good. Some suprapubic abd pain and distention waxes and wanes, overall he is very uncomfortable and is having more back discomfort and sleep dysfunction b/c of all his rectal/GU symptoms.  Pertinent PMH:  DM 2, non insulin-requiring HTN Low back pain/strain  MEDS;   Outpatient Prescriptions Prior to Visit  Medication Sig Dispense Refill  . glucose blood (FREESTYLE LITE) test strip PRN  32 each  12  . Lancets (FREESTYLE) lancets PRN  100 each  5  . lisinopril (PRINIVIL,ZESTRIL) 2.5 MG tablet Take 1 tablet (2.5 mg total) by mouth daily.  30 tablet  2  . Multiple Vitamins-Minerals (MULTI FOR HIM) PACK Take 1 each by mouth daily.        . pioglitazone-metformin (ACTOPLUS MET) 15-500 MG per tablet 1 tab po qAM and 2 tabs po qPM  90 tablet  3  . Tamsulosin HCl (FLOMAX) 0.4 MG CAPS 1-2 tabs po qhs  30 capsule  1    PE: Blood pressure 148/76, pulse 84, temperature 97.8 F (36.6 C), temperature source Oral, weight 188 lb (85.276 kg). Gen: Alert, well appearing.  Patient is oriented to person, place, time, and situation. HEENT:  Eyes: no injection,  icteris, swelling, or exudate.  EOMI, PERRLA. Mouth: lips without lesion/swelling.  Oral mucosa pink and moist.  Dentition intact and without obvious caries or gingival swelling.  Oropharynx without erythema, exudate, or swelling.  Neck: supple, ROM full. No lymphadenopathy, thyromegaly, or mass. Chest: symmetric expansion, nonlabored respirations.  Clear and equal breath sounds in all lung fields.   CV: RRR, no m/r/g.  Peripheral pulses 2+ and symmetric. ABD: soft, NT, ND, BS normal.  Mild discomfort and fullness in suprapubic area.  No hepatospenomegaly or mass.  No bruits. EXT: no clubbing, cyanosis, or edema.    IMPRESSION AND PLAN:  Urinary retention Presumably due to acute prostatitis (vs new onset BPH?). Urine clx late last week was negative. Unfortunately he is not much better since I last saw him last week.  Plan to continue flomax 0.8 mg qhs and will start ciprofloxacin 750mg  bid x 14d. Will ask urology to see him.  For now, he'll stop all cathartics/laxatives except miralax 1 capful bid. F/u in office in 3d: note for work written today.     FOLLOW UP:  Return in about 3 days (around 09/01/2011) for f/u urinary retention.

## 2011-08-31 ENCOUNTER — Other Ambulatory Visit: Payer: Self-pay | Admitting: Family Medicine

## 2011-08-31 ENCOUNTER — Telehealth: Payer: Self-pay | Admitting: Family Medicine

## 2011-08-31 ENCOUNTER — Telehealth: Payer: Self-pay | Admitting: Neurology

## 2011-08-31 DIAGNOSIS — G5790 Unspecified mononeuropathy of unspecified lower limb: Secondary | ICD-10-CM

## 2011-08-31 NOTE — Telephone Encounter (Signed)
Dr. 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 I scheduled an appointment for [name] on 09/20, but I wondered if we could work him in any sooner? He is experiencing a lot discomfort and is missing work.

## 2011-08-31 NOTE — Telephone Encounter (Signed)
Received a phone call from Dr. Annabell Howells today. He had just finished seeing Mr. 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 He was suspicious of viral etiology for the neuropathy b/c Mr. Hunzeker apparently has a splotchy rash on one side of his back in a dermatomal distribution (although it is not painful), and Dr. 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  Mr. Pottinger also has diabetes, so this could also be causing the neuropathy. Dr. Annabell Howells inserted a foley catheter today and will leave it in and possibly d/c it in 31mo at f/u visit.   I will proceed with neurology referral for further e/m-- ASAP (Dr. Modesto Charon).

## 2011-09-01 ENCOUNTER — Encounter: Payer: Self-pay | Admitting: Family Medicine

## 2011-09-01 ENCOUNTER — Ambulatory Visit (INDEPENDENT_AMBULATORY_CARE_PROVIDER_SITE_OTHER): Payer: Managed Care, Other (non HMO) | Admitting: Family Medicine

## 2011-09-01 VITALS — BP 120/74 | HR 83 | Temp 98.0°F | Wt 185.0 lb

## 2011-09-01 DIAGNOSIS — E119 Type 2 diabetes mellitus without complications: Secondary | ICD-10-CM

## 2011-09-01 DIAGNOSIS — R339 Retention of urine, unspecified: Secondary | ICD-10-CM

## 2011-09-01 MED ORDER — LIRAGLUTIDE 18 MG/3ML ~~LOC~~ SOLN: 1.2000 mg | Freq: Every day | SUBCUTANEOUS | Status: DC

## 2011-09-01 NOTE — Assessment & Plan Note (Signed)
Need to get more aggressive with control. Added Victoza injection qd today: start 0.6mg  qd x 7d, then increase to 1.2mg  qd injection.   Samples given today. Continue bid CBG monitoring at home. F/u in office in 2 wks.

## 2011-09-01 NOTE — Progress Notes (Signed)
OFFICE NOTE  09/01/2011  CC:  Chief Complaint  Patient presents with  . Follow-up    urinary retention     HPI:   Patient is [name] who is here for f/u urinary retention, fecal retention, DM 2. 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 Flomax and cipro d/c'd.  He has indwelling bladder cath now.  Back not hurting since catheter in. No LE paresthesias except brief tingling of toes when he wakes up in mornings, goes away after he starts to ambulate. No LE focal weakness.  Splotchy rash on left flank noted on exam by urologist yesterday, asymptomatic.  Pt says glucoses still high 100s/low 200s when he checks it.  Pertinent PMH:  DM 2 Low back pain HTN Thrombocytopenia (plts normal on recent CBC, however)  MEDS;   Outpatient Prescriptions Prior to Visit  Medication Sig Dispense Refill  . glucose blood (FREESTYLE LITE) test strip PRN  32 each  12  . Lancets (FREESTYLE) lancets PRN  100 each  5  . lisinopril (PRINIVIL,ZESTRIL) 2.5 MG tablet Take 1 tablet (2.5 mg total) by mouth daily.  30 tablet  2  . Multiple Vitamins-Minerals (MULTI FOR HIM) PACK Take 1 each by mouth daily.        . pioglitazone-metformin (ACTOPLUS MET) 15-500 MG per tablet 1 tab po qAM and 2 tabs po qPM  90 tablet  3  . Tamsulosin HCl (FLOMAX) 0.4 MG CAPS 1-2 tabs po qhs  30 capsule  1  . ciprofloxacin (CIPRO) 750 MG tablet Take 1 tablet (750 mg total) by mouth 2 (two) times daily.  28 tablet  0    PE: Blood pressure 120/74, pulse 83, temperature 98 F (36.7 C), temperature source Oral, weight 185 lb (83.915 kg), SpO2 98.00%. Gen: Alert, well appearing.  Patient is oriented to person, place, time, and situation. LE strength 5/5 bilat.  DTRs 1+ in patellar and achilles areas bilat. SKIN: left flank with splotchy pinkish macular rash that blanches with pressure--covers an area bout the size of my open hand.  Nontender.  No vesicles,  pustules, or papules.  LAB: glucose in office today: 298  IMPRESSION AND PLAN:  Type II or unspecified type diabetes mellitus without mention of complication, uncontrolled Need to get more aggressive with control. Added Victoza injection qd today: start 0.6mg  qd x 7d, then increase to 1.2mg  qd injection.   Samples given today. Continue bid CBG monitoring at home. F/u in office in 2 wks.  Urinary retention Working dx of pelvic neuropathy, has indwelling cath now. Has neuro consult set for 09/08/11. FMLA paperwork received and will be done.     FOLLOW UP:  Return in about 2 weeks (around 09/15/2011) for f/u DM 2 and pelvic neuropathy.

## 2011-09-01 NOTE — Telephone Encounter (Signed)
Patient had echo done 08/18/11

## 2011-09-01 NOTE — Assessment & Plan Note (Signed)
Working dx of pelvic neuropathy, has indwelling cath now. Has neuro consult set for 09/08/11. FMLA paperwork received and will be done.

## 2011-09-02 NOTE — Telephone Encounter (Signed)
As I mentioned 9/17 at 11:30 would be great!  Matt

## 2011-09-05 ENCOUNTER — Other Ambulatory Visit (INDEPENDENT_AMBULATORY_CARE_PROVIDER_SITE_OTHER): Payer: Managed Care, Other (non HMO)

## 2011-09-05 ENCOUNTER — Ambulatory Visit (INDEPENDENT_AMBULATORY_CARE_PROVIDER_SITE_OTHER): Payer: Managed Care, Other (non HMO) | Admitting: Neurology

## 2011-09-05 ENCOUNTER — Encounter: Payer: Self-pay | Admitting: Neurology

## 2011-09-05 VITALS — BP 88/56 | HR 96 | Ht 69.0 in | Wt 184.0 lb

## 2011-09-05 DIAGNOSIS — G609 Hereditary and idiopathic neuropathy, unspecified: Secondary | ICD-10-CM

## 2011-09-05 LAB — VITAMIN B12: Vitamin B-12: 907 pg/mL (ref 211–911)

## 2011-09-05 NOTE — Progress Notes (Signed)
Dear Dr. [name],  Thank you for having me see [name] in consultation for his urinary retention and bowel incontinence.  As you may recall he is a 50 year old man with a history of diabetes who presents with the sudden onset of bowel and bladder incontinence with documented flaccid bladder on imaging. The patient states that he was doing fine until August 31st when he developed profuse diarrhea with bowel incontinence.  He could also only go small volumes of urine and was seen by urology who noted his bladder was distended.  He has had improvement of his bowel function, but continues to require a catheter for his bladder.  As you felt that he may have a cauda equina syndrome you got an emergent MRI of the L spine done which was unremarkable.  It should be noted that he had a UTI in July of this year.  He also began developing tingling in his toes over the last week or so.  He has never had this problem before.  He does say that he has felt that his legs have been week for about 1 year.  He has had no double vision or difficulty speaking or talking.  PMHx:  - poorly controlled diabetes, last HbA1C was 11.  SocHx: - uses smokeless tobacco, drinks socially.  FamHx: - no history of weakness, neuropathies or other neurologic disease.  ROS:  13 systems were reviewed and are significant for leg pain while walking, difficulty with urination as above.  Otherwise negative.  Exam:      Lying    Standing Filed Vitals:   09/05/11 1126 09/05/11 1231 09/05/11 1232  BP: 110/68 100/60 88/56  Pulse: 92 88 96  Height: 5\' 9"  (1.753 m)    Weight: 184 lb (83.462 kg)     Gen:  Large torsoed man, with obvious appendicular wasting  H&N:  No obvious wasting of the face.  Cardiovascular: The patient has a regular rate and rhythm and no carotid bruits.  Fundoscopy:  Disks are flat. Vessel caliber within normal limits.  No papilledema.  Mental status:   The patient is oriented to person, place and  time. Recent and remote memory are intact. Attention span and concentration are normal. Language including repetition, naming, following commands are intact. Fund of knowledge of current and historical events, as well as vocabulary are normal.  Cranial Nerves: Pupils are equally round and reactive to light. Visual fields full to confrontation. Extraocular movements are intact without nystagmus. Facial sensation and muscles of mastication are intact. Muscles of facial expression are symmetric. Hearing intact to bilateral finger rub. Tongue protrusion, uvula, palate midline.  Shoulder shrug intact.  Neck extension and neck flexion normal strength.  Motor:  The patient has greatly  decreased bulk in both his distal and proximal arms and legs.  Shoulder abduction is 4/5.  Rest of UE 5/5.  LE 4/5 HF bilaterally.  Foot dorsiflexion 4/5 bilaterally.  Otherwise 5/5.   Reflexes:  Are absent throughout.  Coordination:  Normal finger to nose.  No dysdiadokinesia.  Sensation is decreased to temperature, vibration in UE and LE.  Position sense decreased in lowers.  Gait is slightly steppage.    Romberg is negative but he does sway.  Skin - hyperpigmentation of his bilateral LE  MRI L- spine images were reviewed and are unremarkable.  ESR was noted to be 67.  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  While CIDP does not usually present with autonomic findings, it does occur.  Other diagnosis include the hereditary neuropathies(perhaps with a sudden. autonomic presentation due to complications of diabetes).  Recs: 1.  Urinary incontinence/?diffuse neuropathy - I am going to get an EMG/NCS to verify or exclude demyelinating features.  I would also like to do CSF analysis for   cytoalbuminilogic disassociation.  I am also sending off CRP(?inflammatory neuropathy), B12, MMA, SPEP(?POEMS), ANA, RF.  After these studies are done I will see the patient back.  Thank you for having [place] see this patient in consultation.  Feel free to contact me with any questions.  Ralph Raider Modesto Charon, MD [facility], [place] [address] [place], [place] [address] Phone: [phone] Fax: [phone].

## 2011-09-05 NOTE — Patient Instructions (Signed)
Go to the basement to have your labs drawn today.  You have been scheduled for nerve conduction studies/electromyelogram have been scheduled for Friday, October 5th at 1:30pm at [facility] at [address]  [place], [place].  [phone].

## 2011-09-07 ENCOUNTER — Encounter: Payer: Self-pay | Admitting: Neurology

## 2011-09-07 ENCOUNTER — Other Ambulatory Visit (INDEPENDENT_AMBULATORY_CARE_PROVIDER_SITE_OTHER): Payer: Managed Care, Other (non HMO)

## 2011-09-07 ENCOUNTER — Other Ambulatory Visit: Payer: Managed Care, Other (non HMO)

## 2011-09-07 ENCOUNTER — Ambulatory Visit (INDEPENDENT_AMBULATORY_CARE_PROVIDER_SITE_OTHER): Payer: Managed Care, Other (non HMO) | Admitting: Neurology

## 2011-09-07 DIAGNOSIS — R32 Unspecified urinary incontinence: Secondary | ICD-10-CM

## 2011-09-07 LAB — PROTEIN ELECTROPHORESIS, SERUM: Gamma Globulin: 14.5 % (ref 11.1–18.8)

## 2011-09-07 LAB — PROTIME-INR: Prothrombin Time: 12.3 s (ref 10.2–12.4)

## 2011-09-07 NOTE — Progress Notes (Signed)
LUMBAR PUNCTURE PROCEDURE NOTE  The risks and benefits of the procedure were explained.  These include bleeding, infection and post LP headache.  The patient gave his verbal consent.  The patient was positioned in the left lateral decubitus position.  The L3-L4 interspace was identified.  The site was prepped with iodine.  4 ml of lidocaine without epinephrine were infused.  A 20g LP needle was introduced into the interspace.  When the CSF space was hit, the opening pressure was measured at 19 cmH2O.  The fluid was noted to be clear.  18 ml of fluid was removed.  The LP needle was withdrawn.  The site was bandaged.  The patient was instructed to lie supine or on his side for 15 minutes.  He was instructed to drink plenty of fluids and to call the office if there were any significant back pain, leg weakness, or site pain.  CSF was sent for analysis as detailed in the orders.  Lupita Raider Modesto Charon, MD [facility], [place]

## 2011-09-08 ENCOUNTER — Ambulatory Visit: Payer: Managed Care, Other (non HMO) | Admitting: Neurology

## 2011-09-08 LAB — CSF CELL COUNT WITH DIFFERENTIAL: Tube #: 4

## 2011-09-08 LAB — METHYLMALONIC ACID, SERUM: Methylmalonic Acid, Quantitative: 127 nmol/L (ref 87–318)

## 2011-09-08 LAB — OTHER SOLSTAS TEST

## 2011-09-09 LAB — ANGIOTENSIN CONVERTING ENZYME, CSF: ACE, CSF: 2 U/L (ref <=15)

## 2011-09-11 LAB — CSF CULTURE W GRAM STAIN
Gram Stain: NONE SEEN
Organism ID, Bacteria: NO GROWTH

## 2011-09-12 ENCOUNTER — Encounter: Payer: Self-pay | Admitting: Family Medicine

## 2011-09-12 DIAGNOSIS — G629 Polyneuropathy, unspecified: Secondary | ICD-10-CM | POA: Insufficient documentation

## 2011-09-14 ENCOUNTER — Telehealth: Payer: Self-pay

## 2011-09-14 LAB — OLIGOCLONAL BANDS, CSF + SERM

## 2011-09-14 NOTE — Telephone Encounter (Signed)
Informed pt of normal lumbar puncture results and that we will call him with the appt to the MD at [facility] and that he will f/u here after that appt.

## 2011-09-14 NOTE — Telephone Encounter (Signed)
Message copied by Lelon Huh on Wed Sep 14, 2011  9:42 AM ------      Message from: Milas Gain      Created: Tue Sep 13, 2011 11:14 PM       Hi Dineen Conradt.  Could you give mr. Taplin a call and let him know that his LP was normal.  Also if you could let him know that I am referring him to [facility] for consultation with one of the neuromuscular specialists there.  We will see him after he is seen by them.  We will let him know the date when I have it.  Thx!  Matt

## 2011-09-15 ENCOUNTER — Ambulatory Visit (INDEPENDENT_AMBULATORY_CARE_PROVIDER_SITE_OTHER): Payer: Managed Care, Other (non HMO) | Admitting: Family Medicine

## 2011-09-15 ENCOUNTER — Encounter: Payer: Self-pay | Admitting: Family Medicine

## 2011-09-15 VITALS — BP 127/82 | HR 85 | Temp 98.0°F | Ht 69.0 in | Wt 182.1 lb

## 2011-09-15 DIAGNOSIS — Z23 Encounter for immunization: Secondary | ICD-10-CM

## 2011-09-15 DIAGNOSIS — G629 Polyneuropathy, unspecified: Secondary | ICD-10-CM

## 2011-09-15 DIAGNOSIS — G609 Hereditary and idiopathic neuropathy, unspecified: Secondary | ICD-10-CM

## 2011-09-15 MED ORDER — INSULIN PEN NEEDLE 32G X 5 MM MISC: Status: DC

## 2011-09-15 MED ORDER — LIRAGLUTIDE 18 MG/3ML ~~LOC~~ SOLN: SUBCUTANEOUS | Status: DC

## 2011-09-15 NOTE — Progress Notes (Signed)
OFFICE NOTE  09/15/2011  CC:  Chief Complaint  Patient presents with  . Follow-up    DM   . Leg Pain    both legs feel like there are muscle spams in legs (calf and thigh)     HPI:   Patient is [name] who is here for f/u DM 2 and recent pelvic neuropathy. Glucoses improving, fastings 150s-200, 2H PP still closer to 200s consistently.  Adjusting diet slowly.  Compliant with actosplus met.  Now on full dose victoza and denies side effects. In midst of w/u with neuro: LP and blood work done, has scheduled f/u with urology, local neuro, and [facility] neuro appts. Has indwelling catheter.  No fevers, malaise, nausea, or abd pains.  Now having control of BMs almost normally: no fecal incontinence.  Has 2 formed/soft BMs per day. Still having LE tingling/feet numb feeling transient in A.M.  Also some transient LBP and HA in A.M that goes away as he gets going/ambulating.  For the last week or so has noted worsening involuntary, painless twitching of LEs in hs--kept him from sleeping much of last night. No weakness or paresthesias above waistline.  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 glucose blood (FREESTYLE LITE) test strip PRN  32 each  12  . Lancets (FREESTYLE) lancets PRN  100 each  5  . lisinopril (PRINIVIL,ZESTRIL) 2.5 MG tablet Take 1 tablet (2.5 mg total) by mouth daily.  30 tablet  2  . Multiple Vitamins-Minerals (MULTI FOR HIM) PACK Take 1 each by mouth daily.        . pioglitazone-metformin (ACTOPLUS MET) 15-500 MG per tablet 1 tab po qAM and 2 tabs po qPM  90 tablet  3  . Liraglutide (VICTOZA) 18 MG/3ML SOLN Inject 0.2 mLs (1.2 mg total) into the skin daily.  9 mL  0  . Tamsulosin HCl (FLOMAX) 0.4 MG CAPS 1-2 tabs po qhs  30 capsule  1    PE: Blood pressure 127/82, pulse 85, temperature 98 F (36.7 C),  temperature source Oral, height 5\' 9"  (1.753 m), weight 182 lb 1.9 oz (82.609 kg), SpO2 100.00%. Gen: Alert, well appearing.  Patient is oriented to person, place, time, and situation. CHEST: RRR.  Lungs CTA bilat, breathing nonlabored. ABD: soft, NT, ND, BS normal.  No hepatospenomegaly.  He has easily palpable abdominal aortic pulsations, without bruits. EXT: no clubbing, cyanosis, or edema.  Neuro: LE strength 5/5 prox and dist bilat.  No fasciculations observed.  No tremor.   IMPRESSION AND PLAN:  Type II or unspecified type diabetes mellitus without mention of complication, uncontrolled Control improving some since increase in actosplus met and addition of victoza. No changes for now.  Continue bid glucose checks, dietary adjustments. At next f/u visit (mid November, already scheduled) we'll recheck HbA1c and if his bladder cath is out we'll check urine microalbumin/cr. In the next few months we'll need to get him to the ophthalmologist for D.R screening, but he's certainly got enough going on already right now.  Peripheral neuropathy Pelvic and diffuse LE peripheral neuropathy, undetermined etiology.  Currently on no specific treatment.  Recent new symptom of what sounds like either diffuse LE myoclonus vs fasciculations is worrisome, but near resolution of his bowel incontinence is encouraging. 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  He has asked  neuro at [facility] to see him, appt is tomorrow. Still set up to have EMGs/NCS plus he still is set to f/u with urology for urodynamic testing and again for cystoscopy (for hx of hematuria? Or for completeness given his recent urinary retention?).    He'll call back before next f/u if his fasciculations/myoclonus continues to adversely effect his sleep and I'll rx sleep aid.  FOLLOW UP:  Return for has appt scheduled mid november 2012 already.

## 2011-09-15 NOTE — Assessment & Plan Note (Signed)
Control improving some since increase in actosplus met and addition of victoza. No changes for now.  Continue bid glucose checks, dietary adjustments. At next f/u visit (mid November, already scheduled) we'll recheck HbA1c and if his bladder cath is out we'll check urine microalbumin/cr. In the next few months we'll need to get him to the ophthalmologist for D.R screening, but he's certainly got enough going on already right now.

## 2011-09-15 NOTE — Assessment & Plan Note (Signed)
Pelvic and diffuse LE peripheral neuropathy, undetermined etiology.  Currently on no specific treatment.  Recent new symptom of what sounds like either diffuse LE myoclonus vs fasciculations is worrisome, but near resolution of his bowel incontinence is encouraging. 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  He has asked neuro at [facility] to see him, appt is tomorrow. Still set up to have EMGs/NCS plus he still is set to f/u with urology for urodynamic testing and again for cystoscopy (for hx of hematuria? Or for completeness given his recent urinary retention?).

## 2011-09-21 ENCOUNTER — Telehealth: Payer: Self-pay | Admitting: Neurology

## 2011-09-21 ENCOUNTER — Telehealth: Payer: Self-pay | Admitting: Family Medicine

## 2011-09-21 NOTE — Telephone Encounter (Signed)
Pls call him and ask him the name of the MD he saw at [facility] recently and ask if he has the contact info (phone # or dept name at [facility]?) for him/her.  I need to try to get records plus I need this info for his progress report for his short term disability paperwork.  Thx--PM

## 2011-09-21 NOTE — Telephone Encounter (Signed)
Called pt to make him aware that we are waiting to hear back from [facility] regarding his appointment. While on phone, pt complained of numbness in hands and a lump on his back that was causing discomfort.

## 2011-09-21 NOTE — Telephone Encounter (Signed)
I spoke to pt who states he saw Dr. Lendell Caprice, but did not have any demographic info for her.  I spoke to [place] at Dr. Nash Dimmer office and she and Dr. Modesto Charon were able to give Dr. Pincus Sanes attending's name and department phone number.  Attending is Dr. Su Hilt @ [phone].

## 2011-09-22 ENCOUNTER — Emergency Department (HOSPITAL_COMMUNITY): Payer: Managed Care, Other (non HMO)

## 2011-09-22 ENCOUNTER — Inpatient Hospital Stay (HOSPITAL_COMMUNITY)
Admission: EM | Admit: 2011-09-22 | Discharge: 2011-10-06 | DRG: 689 | Disposition: A | Discharge status: Home Health | Payer: Managed Care, Other (non HMO) | Attending: Family Medicine | Admitting: Family Medicine

## 2011-09-22 DIAGNOSIS — R5381 Other malaise: Secondary | ICD-10-CM | POA: Diagnosis present

## 2011-09-22 DIAGNOSIS — R209 Unspecified disturbances of skin sensation: Secondary | ICD-10-CM | POA: Diagnosis present

## 2011-09-22 DIAGNOSIS — R000 Tachycardia, unspecified: Secondary | ICD-10-CM | POA: Diagnosis present

## 2011-09-22 DIAGNOSIS — K603 Anal fistula, unspecified: Secondary | ICD-10-CM | POA: Diagnosis present

## 2011-09-22 DIAGNOSIS — D62 Acute posthemorrhagic anemia: Secondary | ICD-10-CM | POA: Diagnosis not present

## 2011-09-22 DIAGNOSIS — E1142 Type 2 diabetes mellitus with diabetic polyneuropathy: Secondary | ICD-10-CM | POA: Diagnosis present

## 2011-09-22 DIAGNOSIS — M129 Arthropathy, unspecified: Secondary | ICD-10-CM | POA: Diagnosis present

## 2011-09-22 DIAGNOSIS — R609 Edema, unspecified: Secondary | ICD-10-CM | POA: Diagnosis present

## 2011-09-22 DIAGNOSIS — K7030 Alcoholic cirrhosis of liver without ascites: Secondary | ICD-10-CM | POA: Diagnosis present

## 2011-09-22 DIAGNOSIS — K6289 Other specified diseases of anus and rectum: Secondary | ICD-10-CM | POA: Diagnosis present

## 2011-09-22 DIAGNOSIS — N12 Tubulo-interstitial nephritis, not specified as acute or chronic: Principal | ICD-10-CM | POA: Diagnosis present

## 2011-09-22 DIAGNOSIS — E871 Hypo-osmolality and hyponatremia: Secondary | ICD-10-CM | POA: Diagnosis present

## 2011-09-22 DIAGNOSIS — K921 Melena: Secondary | ICD-10-CM | POA: Diagnosis present

## 2011-09-22 DIAGNOSIS — E8809 Other disorders of plasma-protein metabolism, not elsewhere classified: Secondary | ICD-10-CM | POA: Diagnosis present

## 2011-09-22 DIAGNOSIS — Z794 Long term (current) use of insulin: Secondary | ICD-10-CM

## 2011-09-22 DIAGNOSIS — R509 Fever, unspecified: Secondary | ICD-10-CM | POA: Diagnosis present

## 2011-09-22 DIAGNOSIS — R339 Retention of urine, unspecified: Secondary | ICD-10-CM | POA: Diagnosis present

## 2011-09-22 DIAGNOSIS — E1149 Type 2 diabetes mellitus with other diabetic neurological complication: Secondary | ICD-10-CM | POA: Diagnosis present

## 2011-09-22 DIAGNOSIS — F1020 Alcohol dependence, uncomplicated: Secondary | ICD-10-CM | POA: Diagnosis present

## 2011-09-22 DIAGNOSIS — I776 Arteritis, unspecified: Secondary | ICD-10-CM | POA: Diagnosis present

## 2011-09-22 DIAGNOSIS — E876 Hypokalemia: Secondary | ICD-10-CM | POA: Diagnosis not present

## 2011-09-22 DIAGNOSIS — I10 Essential (primary) hypertension: Secondary | ICD-10-CM | POA: Diagnosis present

## 2011-09-22 DIAGNOSIS — M545 Low back pain, unspecified: Secondary | ICD-10-CM | POA: Diagnosis present

## 2011-09-22 DIAGNOSIS — A4901 Methicillin susceptible Staphylococcus aureus infection, unspecified site: Secondary | ICD-10-CM | POA: Diagnosis present

## 2011-09-22 DIAGNOSIS — N151 Renal and perinephric abscess: Secondary | ICD-10-CM | POA: Diagnosis present

## 2011-09-22 DIAGNOSIS — N320 Bladder-neck obstruction: Secondary | ICD-10-CM | POA: Diagnosis present

## 2011-09-22 DIAGNOSIS — E43 Unspecified severe protein-calorie malnutrition: Secondary | ICD-10-CM | POA: Diagnosis present

## 2011-09-22 LAB — COMPREHENSIVE METABOLIC PANEL WITH GFR
ALT: 21 U/L (ref 0–53)
AST: 18 U/L (ref 0–37)
Albumin: 2.3 g/dL — ABNORMAL LOW (ref 3.5–5.2)
Alkaline Phosphatase: 144 U/L — ABNORMAL HIGH (ref 39–117)
BUN: 47 mg/dL — ABNORMAL HIGH (ref 6–23)
CO2: 22 meq/L (ref 19–32)
Calcium: 9.3 mg/dL (ref 8.4–10.5)
Chloride: 80 meq/L — ABNORMAL LOW (ref 96–112)
Creatinine, Ser: 0.78 mg/dL (ref 0.50–1.35)
GFR calc Af Amer: >90 mL/min
GFR calc non Af Amer: >90 mL/min
Glucose, Bld: 448 mg/dL — ABNORMAL HIGH (ref 70–99)
Potassium: 4.6 meq/L (ref 3.5–5.1)
Sodium: 116 meq/L — CL (ref 135–145)
Total Bilirubin: 0.9 mg/dL (ref 0.3–1.2)
Total Protein: 6.9 g/dL (ref 6.0–8.3)

## 2011-09-22 LAB — CBC
Hemoglobin: 12.3 g/dL — ABNORMAL LOW (ref 13.0–17.0)
MCH: 29.7 pg (ref 26.0–34.0)
MCHC: 35 g/dL (ref 30.0–36.0)

## 2011-09-22 LAB — DIFFERENTIAL
Basophils Absolute: 0 10*3/uL (ref 0.0–0.1)
Basophils Relative: 0 % (ref 0–1)
Eosinophils Absolute: 0 10*3/uL (ref 0.0–0.7)
Eosinophils Relative: 0 % (ref 0–5)
Lymphocytes Relative: 3 % — ABNORMAL LOW (ref 12–46)
Lymphs Abs: 0.5 10*3/uL — ABNORMAL LOW (ref 0.7–4.0)
Monocytes Absolute: 1.4 10*3/uL — ABNORMAL HIGH (ref 0.1–1.0)
Monocytes Relative: 8 % (ref 3–12)
Neutro Abs: 15 10*3/uL — ABNORMAL HIGH (ref 1.7–7.7)
Neutrophils Relative %: 89 % — ABNORMAL HIGH (ref 43–77)

## 2011-09-22 LAB — URINALYSIS, ROUTINE W REFLEX MICROSCOPIC
Bilirubin Urine: NEGATIVE
Glucose, UA: >1000 mg/dL — AB
Ketones, ur: 15 mg/dL — AB
Nitrite: NEGATIVE
Protein, ur: 100 mg/dL — AB
Specific Gravity, Urine: 1.021 (ref 1.005–1.030)
Urobilinogen, UA: 0.2 mg/dL (ref 0.0–1.0)
pH: 6.5 (ref 5.0–8.0)

## 2011-09-22 LAB — GLUCOSE, CAPILLARY
Glucose-Capillary: 358 mg/dL — ABNORMAL HIGH (ref 70–99)
Glucose-Capillary: 377 mg/dL — ABNORMAL HIGH (ref 70–99)

## 2011-09-22 LAB — URINE MICROSCOPIC-ADD ON

## 2011-09-22 NOTE — Telephone Encounter (Signed)
please add on [facility] at 9:30 tomorrow, for an urgent return appt.

## 2011-09-23 ENCOUNTER — Ambulatory Visit: Payer: Self-pay | Admitting: Neurology

## 2011-09-23 LAB — COMPREHENSIVE METABOLIC PANEL WITH GFR
ALT: 25 U/L (ref 0–53)
AST: 35 U/L (ref 0–37)
Albumin: 2.1 g/dL — ABNORMAL LOW (ref 3.5–5.2)
Alkaline Phosphatase: 125 U/L — ABNORMAL HIGH (ref 39–117)
BUN: 37 mg/dL — ABNORMAL HIGH (ref 6–23)
CO2: 23 meq/L (ref 19–32)
Calcium: 8.5 mg/dL (ref 8.4–10.5)
Chloride: 89 meq/L — ABNORMAL LOW (ref 96–112)
Creatinine, Ser: 0.89 mg/dL (ref 0.50–1.35)
GFR calc Af Amer: >90 mL/min (ref >90)
GFR calc non Af Amer: >90 mL/min (ref >90)
Glucose, Bld: 163 mg/dL — ABNORMAL HIGH (ref 70–99)
Potassium: 3.9 meq/L (ref 3.5–5.1)
Sodium: 121 meq/L — ABNORMAL LOW (ref 135–145)
Total Bilirubin: 0.7 mg/dL (ref 0.3–1.2)
Total Protein: 6.1 g/dL (ref 6.0–8.3)

## 2011-09-23 LAB — OSMOLALITY: Osmolality: 276 mosm/kg (ref 275–300)

## 2011-09-23 LAB — GLUCOSE, CAPILLARY
Glucose-Capillary: 152 mg/dL — ABNORMAL HIGH (ref 70–99)
Glucose-Capillary: 185 mg/dL — ABNORMAL HIGH (ref 70–99)
Glucose-Capillary: 188 mg/dL — ABNORMAL HIGH (ref 70–99)
Glucose-Capillary: 318 mg/dL — ABNORMAL HIGH (ref 70–99)

## 2011-09-23 LAB — HEMOGLOBIN A1C
Hgb A1c MFr Bld: 10.9 % — ABNORMAL HIGH (ref <5.7)
Mean Plasma Glucose: 266 mg/dL — ABNORMAL HIGH (ref <117)

## 2011-09-23 LAB — SODIUM, URINE, RANDOM: Sodium, Ur: 17 mEq/L

## 2011-09-23 LAB — OSMOLALITY, URINE: Osmolality, Ur: 430 mosm/kg (ref 390–1090)

## 2011-09-24 LAB — COMPREHENSIVE METABOLIC PANEL
ALT: 28 U/L (ref 0–53)
Alkaline Phosphatase: 126 U/L — ABNORMAL HIGH (ref 39–117)
BUN: 47 mg/dL — ABNORMAL HIGH (ref 6–23)
CO2: 25 mEq/L (ref 19–32)
Chloride: 90 mEq/L — ABNORMAL LOW (ref 96–112)
GFR calc Af Amer: >90 mL/min (ref >90)
Glucose, Bld: 201 mg/dL — ABNORMAL HIGH (ref 70–99)
Potassium: 3.9 mEq/L (ref 3.5–5.1)
Sodium: 122 mEq/L — ABNORMAL LOW (ref 135–145)
Total Bilirubin: 0.9 mg/dL (ref 0.3–1.2)

## 2011-09-24 LAB — DIFFERENTIAL
Basophils Absolute: 0 10*3/uL (ref 0.0–0.1)
Lymphocytes Relative: 4 % — ABNORMAL LOW (ref 12–46)
Monocytes Absolute: 1 10*3/uL (ref 0.1–1.0)
Neutro Abs: 10.8 10*3/uL — ABNORMAL HIGH (ref 1.7–7.7)
Neutrophils Relative %: 87 % — ABNORMAL HIGH (ref 43–77)

## 2011-09-24 LAB — GLUCOSE, CAPILLARY
Glucose-Capillary: 287 mg/dL — ABNORMAL HIGH (ref 70–99)
Glucose-Capillary: 199 mg/dL — ABNORMAL HIGH (ref 70–99)

## 2011-09-24 LAB — URINE CULTURE: Colony Count: >=100000

## 2011-09-24 LAB — CBC
HCT: 32.4 % — ABNORMAL LOW (ref 39.0–52.0)
Hemoglobin: 10.9 g/dL — ABNORMAL LOW (ref 13.0–17.0)
RBC: 3.76 MIL/uL — ABNORMAL LOW (ref 4.22–5.81)
WBC: 12.4 10*3/uL — ABNORMAL HIGH (ref 4.0–10.5)

## 2011-09-24 LAB — MAGNESIUM: Magnesium: 2.3 mg/dL (ref 1.5–2.5)

## 2011-09-25 LAB — DIFFERENTIAL
Basophils Relative: 0 % (ref 0–1)
Eosinophils Absolute: 0.2 10*3/uL (ref 0.0–0.7)
Eosinophils Relative: 2 % (ref 0–5)
Lymphs Abs: 0.5 10*3/uL — ABNORMAL LOW (ref 0.7–4.0)
Monocytes Absolute: 1 10*3/uL (ref 0.1–1.0)
Neutrophils Relative %: 83 % — ABNORMAL HIGH (ref 43–77)

## 2011-09-25 LAB — CBC
HCT: 30 % — ABNORMAL LOW (ref 39.0–52.0)
Hemoglobin: 10.3 g/dL — ABNORMAL LOW (ref 13.0–17.0)
MCH: 29.1 pg (ref 26.0–34.0)
MCHC: 34.3 g/dL (ref 30.0–36.0)
RDW: 13.1 % (ref 11.5–15.5)

## 2011-09-25 LAB — GLUCOSE, CAPILLARY
Glucose-Capillary: 162 mg/dL — ABNORMAL HIGH (ref 70–99)
Glucose-Capillary: 185 mg/dL — ABNORMAL HIGH (ref 70–99)
Glucose-Capillary: 313 mg/dL — ABNORMAL HIGH (ref 70–99)

## 2011-09-25 LAB — CULTURE, BLOOD (ROUTINE X 2)

## 2011-09-25 LAB — COMPREHENSIVE METABOLIC PANEL
Albumin: 1.7 g/dL — ABNORMAL LOW (ref 3.5–5.2)
BUN: 41 mg/dL — ABNORMAL HIGH (ref 6–23)
Calcium: 8.4 mg/dL (ref 8.4–10.5)
Creatinine, Ser: 0.75 mg/dL (ref 0.50–1.35)
GFR calc Af Amer: >90 mL/min (ref >90)
Glucose, Bld: 171 mg/dL — ABNORMAL HIGH (ref 70–99)
Total Protein: 5.4 g/dL — ABNORMAL LOW (ref 6.0–8.3)

## 2011-09-25 LAB — MAGNESIUM: Magnesium: 2.3 mg/dL (ref 1.5–2.5)

## 2011-09-26 LAB — COMPREHENSIVE METABOLIC PANEL
ALT: 42 U/L (ref 0–53)
Alkaline Phosphatase: 311 U/L — ABNORMAL HIGH (ref 39–117)
CO2: 27 mEq/L (ref 19–32)
Calcium: 9.3 mg/dL (ref 8.4–10.5)
GFR calc Af Amer: >90 mL/min (ref >90)
GFR calc non Af Amer: >90 mL/min (ref >90)
Glucose, Bld: 239 mg/dL — ABNORMAL HIGH (ref 70–99)
Potassium: 3.8 mEq/L (ref 3.5–5.1)
Sodium: 126 mEq/L — ABNORMAL LOW (ref 135–145)

## 2011-09-26 LAB — DIFFERENTIAL
Basophils Relative: 0 % (ref 0–1)
Lymphocytes Relative: 7 % — ABNORMAL LOW (ref 12–46)
Lymphs Abs: 0.8 10*3/uL (ref 0.7–4.0)
Monocytes Relative: 7 % (ref 3–12)
Neutro Abs: 10.8 10*3/uL — ABNORMAL HIGH (ref 1.7–7.7)
Neutrophils Relative %: 84 % — ABNORMAL HIGH (ref 43–77)

## 2011-09-26 LAB — CBC
HCT: 34 % — ABNORMAL LOW (ref 39.0–52.0)
Hemoglobin: 11.7 g/dL — ABNORMAL LOW (ref 13.0–17.0)
MCH: 29.5 pg (ref 26.0–34.0)
RBC: 3.97 MIL/uL — ABNORMAL LOW (ref 4.22–5.81)

## 2011-09-26 LAB — GLUCOSE, CAPILLARY: Glucose-Capillary: 288 mg/dL — ABNORMAL HIGH (ref 70–99)

## 2011-09-26 NOTE — Discharge Summary (Signed)
[name], GEAR NO.:  [id number]  MEDICAL RECORD NO.:  [id number]  LOCATION:  1519                         FACILITY:  [facility]  PHYSICIAN:  Talmage Nap, MD  DATE OF BIRTH:  [birth year]  DATE OF ADMISSION:  09/22/2011 DATE OF DISCHARGE:                        DISCHARGE SUMMARY - REFERRING   PRIMARY CARE PHYSICIAN:  [name], MD.  CONSULTANT INVOLVING THE CASE:  Neurology, Dr. Modesto Charon, [place].  DIAGNOSES: 1. Urinary tract infection. 2. Diabetes mellitus (uncontrolled). 3. Bladder outlet obstruction, status post chronic bladder     catheterization. 4. Hyponatremia. 5. Peripheral neuropathy. 6. History of small vessel vasculitis/polyneuropathy. 7. Painless hematochezia, resolved. 8. Hypertension. 9. Arthritis. 10.Deconditioning.  The patient is a 50 year old Caucasian male with history of diabetes mellitus, peripheral neuropathy, and small-vessel vasculitis with polyneuropathy, who was admitted to the hospital on September 22, 2011, with complaint of fever of about 102.  The patient had earlier on presented to his urologist in the office for a follow-up of his chronic indwelling bladder catheter.  At the urologist's office he was found to be febrile and subsequently asked to go to the emergency room.  In the ED, the patient gave additional history that he has been feeling very weak and sick for about a week and had low appetite with nausea and multiple episodes of vomiting.  His fever was said to be on and off with night sweats.  He also complained about low back pain.  He denied any diarrhea.  He denied any chest pain or shortness of breath and subsequently presented to the emergency room after being sent from the urologist's office for further evaluation of his fever.  PREADMISSION MEDICATIONS:  His preadmission meds include: 1. Victoza 18 mcg b.i.d. 2. ACTOplus Met 15/1000 one p.o. b.i.d. 3. Lisinopril 2.5 mg p.o. daily. 4. Aspirin 81  mg p.o. daily. 5. Multivitamin one p.o. daily.  ALLERGIES:  He has no known allergies.  FAMILY HISTORY:  Father died at the age of 41 from myocardial infarction.  Has a sister with hemochromatosis.  SOCIAL HISTORY:  Essentially documented in the initial history and physical as dictated by Dr. Jonny Ruiz.  REVIEW OF SYSTEMS:  Essentially documented in the initial history and physical as dictated by Dr. Jonny Ruiz.  At the time, the patient was seen by the admitting physician.  PHYSICAL EXAMINATION:  VITAL SIGNS:  Blood pressure 122/59, pulse 104, respiratory rate 22, temperature was 99.2, and saturating 100% on room air. HEENT:  Pupils were reactive to light and extraocular muscles are intact. NECK:  He had no jugular venous distention.  No carotid bruit.  No lymphadenopathy. CHEST:  Clear to auscultation. HEART:  Heart sounds are one and two with soft systolic murmur. ABDOMEN:  Soft, nontender.  Liver, spleen tip not palpable.  Bowel sounds are positive.  EXTREMITIES:  Showed no pedal edema.  Quad is atrophic with muscle wasting. NEUROPSYCHIATRIC EVALUATION:  Unremarkable. NEUROLOGIC EXAM:  Showed the patient to be awake, alert, and oriented with decreased motor power in the lower extremity.  LABORATORY DATA:  His initial comprehensive metabolic panel shows sodium of 116, potassium of 4.6, chloride of 80 with a bicarb of 22, glucose  is 448, BUN is 47, and creatinine is 0.78.  Urinalysis showed nitrite negative with large leukocyte esterase and large blood.  Urine microscopy showed wbc too numerous to count, bacteria many.  Complete blood count with differential showed WBC of 16.9, hemoglobin of 12.3, hematocrit of 35.1, MCV of 84.8, platelet count of 133, neutrophil 89%, absolute neutrophil count is 15.0, and hemoglobin A1c is 10.9, elevated. Serum osmolality 276, normal.  Urine osmolality 430, normal.  Random sodium urine is 17.  Fecal occult blood test is  positive.  Urine culture grew Staphylococcus aureus and blood culture x2 grew Staphylococcus aureus as well.  A repeat comprehensive metabolic panel done on September 25, 2011, shows sodium of 125, potassium of 3.8, chloride of 92 with a bicarb of 25, glucose is 171, BUN is 41, and creatinine is 0.75.  LFT showed total bilirubin 0.8, alkaline phosphatase 137, AST 34, ALT 28, and magnesium level is 2.3; and a repeat complete blood count with differential showed WBC of 9.8, hemoglobin of 10.3, hematocrit of 30.0, MCV of 84.7, platelet count of 139, neutrophil is 83%, and absolute neutrophil count is 8.1.  Imaging studies done include chest x-ray which showed low volume lungs, no acute abnormalities seen.  HOSPITAL COURSE:  The patient was admitted to general medical floor.  He was started on ciprofloxacin 400 mg IV q.12 hourly for the treatment of his UTI.  He was on Accu-Cheks with regular insulin sliding scale.  At the time, the patient was seen by me, which was on September 23, 2011.  He complained about paraesthesia in the arms.  But examination of the patient showed blood pressure 118/61, pulse 112, respiratory rate 18, and temperature was 100.2.  Muscle power in the lower extremity with a quad to the muscle power in the upper extremity.  Sensations were intact.  At this point, however, Lopressor 25 mg p.o. b.i.d. was added to the patient's regimen because of his tachycardia and also the patient had Lantus insulin 20 units subcu q.h.s. added to his regimen as well. He was also given gabapentin 100 mg p.o. q.h.s. and on September 24, 2011, the patient had his urine bypass changed and his Lantus insulin was increased to 25 units subcu q.h.s.  Because of the positive blood culture as well as a urine culture which showed Staphylococcus aureus, ciprofloxacin was discontinued.  The patient was started on vancomycin and dosing was done by Pharmacy.  The patient was also evaluated by the neurologist  from [facility], Dr. Modesto Charon, who recommended that the patient will benefit from IgG and this was agreeable by me.  So far the patient has remained the patient had made remarkable improvement.  His fever has settled.  Denies any nausea or vomiting.  Had one to two episodes of painless hematochezia, were resolved on its own and H and H had remained fairly stable.  He was seen by me today.  Examination was essentially unremarkable.  VITAL SIGNS:  Blood pressure of 101/50, temperature is 97.2, pulse is 81, and respiratory rate 18; medically stable.  CURRENT MEDICATIONS:  His current medications include the following: 1. Aspirin 81 mg p.o. daily. 2. Lovenox 40 mg subcu q.24. 3. Gabapentin 100 mg p.o. q.h.s. 4. Lantus insulin 25 units subcu q.h.s. 5. Accu-Cheks with regular insulin sliding scale. 6. Lisinopril 2.5 mg p.o. daily. 7. Metoprolol 25 mg p.o. b.i.d. 8. Tylenol 650 mg p.o. q.4 p.r.n. 9. Hydrocodone/APAP 1-2 tablets p.o. q.4. 10.Zofran 4 mg IV q.6. 11.He is also on Ambien  5 mg p.o. q.h.s. and vancomycin 1000 mg IV q.8     hourly.  The patient will be followed and evaluated on daily basis and time of discharge will be determined by the rounding physician and therefore take all meds will be dictated.     Talmage Nap, MD     CN/MEDQ  D:  09/25/2011  T:  09/25/2011  Job:  [id number]  Electronically Signed by Talmage Nap  on 09/26/2011 07:07:08 AM

## 2011-09-27 LAB — GLUCOSE, CAPILLARY
Glucose-Capillary: 384 mg/dL — ABNORMAL HIGH (ref 70–99)
Glucose-Capillary: 118 mg/dL — ABNORMAL HIGH (ref 70–99)
Glucose-Capillary: 195 mg/dL — ABNORMAL HIGH (ref 70–99)

## 2011-09-27 LAB — COMPREHENSIVE METABOLIC PANEL
ALT: 31 U/L (ref 0–53)
Albumin: 1.7 g/dL — ABNORMAL LOW (ref 3.5–5.2)
Alkaline Phosphatase: 268 U/L — ABNORMAL HIGH (ref 39–117)
Calcium: 8.1 mg/dL — ABNORMAL LOW (ref 8.4–10.5)
GFR calc Af Amer: >90 mL/min (ref >90)
Potassium: 3.2 mEq/L — ABNORMAL LOW (ref 3.5–5.1)
Sodium: 130 mEq/L — ABNORMAL LOW (ref 135–145)
Total Protein: 5.3 g/dL — ABNORMAL LOW (ref 6.0–8.3)

## 2011-09-27 LAB — DIFFERENTIAL
Basophils Absolute: 0.1 10*3/uL (ref 0.0–0.1)
Eosinophils Absolute: 0.2 10*3/uL (ref 0.0–0.7)
Lymphs Abs: 0.5 10*3/uL — ABNORMAL LOW (ref 0.7–4.0)
Neutro Abs: 7.3 10*3/uL (ref 1.7–7.7)

## 2011-09-27 LAB — CBC
Hemoglobin: 9.7 g/dL — ABNORMAL LOW (ref 13.0–17.0)
MCH: 29.5 pg (ref 26.0–34.0)
MCHC: 35 g/dL (ref 30.0–36.0)
Platelets: 171 10*3/uL (ref 150–400)
RBC: 3.29 MIL/uL — ABNORMAL LOW (ref 4.22–5.81)
RDW: 13.2 % (ref 11.5–15.5)
WBC: 9.3 10*3/uL (ref 4.0–10.5)

## 2011-09-28 DIAGNOSIS — I330 Acute and subacute infective endocarditis: Secondary | ICD-10-CM

## 2011-09-28 DIAGNOSIS — R7881 Bacteremia: Secondary | ICD-10-CM

## 2011-09-28 LAB — GLUCOSE, CAPILLARY
Glucose-Capillary: 272 mg/dL — ABNORMAL HIGH (ref 70–99)
Glucose-Capillary: 141 mg/dL — ABNORMAL HIGH (ref 70–99)
Glucose-Capillary: 372 mg/dL — ABNORMAL HIGH (ref 70–99)
Glucose-Capillary: 112 mg/dL — ABNORMAL HIGH (ref 70–99)
Glucose-Capillary: 136 mg/dL — ABNORMAL HIGH (ref 70–99)

## 2011-09-28 LAB — DIFFERENTIAL
Basophils Relative: 0 % (ref 0–1)
Eosinophils Absolute: 0.1 10*3/uL (ref 0.0–0.7)
Eosinophils Relative: 1 % (ref 0–5)
Lymphocytes Relative: 7 % — ABNORMAL LOW (ref 12–46)
Monocytes Absolute: 1.2 10*3/uL — ABNORMAL HIGH (ref 0.1–1.0)
Neutro Abs: 7.9 10*3/uL — ABNORMAL HIGH (ref 1.7–7.7)
Neutrophils Relative %: 80 % — ABNORMAL HIGH (ref 43–77)

## 2011-09-28 LAB — CBC
HCT: 26 % — ABNORMAL LOW (ref 39.0–52.0)
MCH: 29.5 pg (ref 26.0–34.0)
MCV: 86.1 fL (ref 78.0–100.0)
RBC: 3.02 MIL/uL — ABNORMAL LOW (ref 4.22–5.81)
WBC: 9.9 10*3/uL (ref 4.0–10.5)

## 2011-09-28 LAB — COMPREHENSIVE METABOLIC PANEL
BUN: 18 mg/dL (ref 6–23)
CO2: 30 mEq/L (ref 19–32)
Calcium: 8.3 mg/dL — ABNORMAL LOW (ref 8.4–10.5)
Chloride: 98 mEq/L (ref 96–112)
Creatinine, Ser: 0.55 mg/dL (ref 0.50–1.35)
GFR calc Af Amer: >90 mL/min (ref >90)
GFR calc non Af Amer: >90 mL/min (ref >90)
Glucose, Bld: 146 mg/dL — ABNORMAL HIGH (ref 70–99)
Total Bilirubin: 1.1 mg/dL (ref 0.3–1.2)

## 2011-09-28 LAB — MAGNESIUM: Magnesium: 1.5 mg/dL (ref 1.5–2.5)

## 2011-09-29 ENCOUNTER — Ambulatory Visit: Payer: Managed Care, Other (non HMO) | Admitting: Neurology

## 2011-09-29 ENCOUNTER — Inpatient Hospital Stay (HOSPITAL_COMMUNITY): Payer: Managed Care, Other (non HMO)

## 2011-09-29 LAB — GLUCOSE, CAPILLARY
Glucose-Capillary: 93 mg/dL (ref 70–99)
Glucose-Capillary: 279 mg/dL — ABNORMAL HIGH (ref 70–99)

## 2011-09-29 NOTE — Consult Note (Signed)
NAMEELICEO, GLADU NO.:  [id number]  MEDICAL RECORD NO.:  [id number]  LOCATION:  1519                         FACILITY:  [facility]  PHYSICIAN:  Judyann Munson, MD     DATE OF BIRTH:  03/04/1961  DATE OF CONSULTATION: DATE OF DISCHARGE:                                CONSULTATION   REQUESTING PHYSICIAN:  __________  CONSULTING PHYSICIAN:  Judyann Munson, MD  REASON FOR CONSULTATION:  Methicillin-sensitive Staphylococcus aureus bacteremia.  Please provide length of therapy.  HISTORY OF PRESENT ILLNESS:  Mr. Bail is a 50 year old Caucasian male with history of diabetes which has been complicated by peripheral neuropathy and a small vessel vasculopathy associated with diabetes.  He also was having urinary retention for which he has had a chronic Foley placed mostly 1 month ago by Urology.  The patient then followed up by the primary care physician as well as his neurologist in order to evaluate his symptoms of fecal retention, urinary retention, as well as his neuropathy.  He was admitted on October 4th for new onset of high fevers that started within 48 hours of admit. the patient stated that he was feeling ill.  He had decreased appetite, nausea, and vomiting, some night sweats, and intermittent fevers.  He was referred to the emergency room for an evaluation.  In addition to being found to be febrile, he was also found to have a leukocytosis about 40981 with 89% neutrophils. While on the ED, he was also found to be slightly tachycardic.  He was started on broad-spectrum antibiotics concerning for urinary tract infection given his chronic Foley and the fact that his UA  had large leukocyte esterases and too many to count white blood cells and many bacteria, although this was from a chronic indwelling Foley. His infectious workup did also show that the patient was bacteremic with 4 in 4 blood cultures growing methicillin-sensitive Staph aureus.  The patient  was placed on vancomycin, and subsequently Infectious Disease consultatoin was called to provide length of therapy.    REVIEW OF SYSTEMS:   The patient in addition to having malaise and fevers, he states that he is having pain with his Foley, now does seem that he has blood in his urine, thought to be due to, however, when his Foley catheter was accidently placed.  The original Foley was placed roughly a month ago, has not been changed out and states that his fever has improved.  He does complain of having some diarrhea and noted to have bright red blood per rectum especially in the evening that has not recently been evaluated.  He is also noted to have pain to his lower extremities, thought to have swelling because of the IV fluids.  No nausea or vomiting.  No more night sweats or rigors, fevers or chills.  Denies any cough or chest pain.  No difficulty with vision. 12 point of review of systems is otherwise negative.  PAST MEDICAL HISTORY: 1. 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 2. Small vessel vasculopathy associated with diabetes. 3. Hypertension. 4. History of __________. 5. Venostasis. 6. Fecal retention. 7. Urinary retention, status  post Foley placement, possibly due to     pelvic neuropathy. 8. History of thrombocytopenia. 9. Low back pain.  CURRENT MEDICATIONS: 1. Nafcillin 2 g q.4 hours. 2. Aspirin 81 mg daily. 3. Enoxaparin 40 mg daily. 4. Lasix 20 mg q.8 h. 5. Neurontin 100 mg nightly. 6. Lantus 25 units nightly. 7. NovoLog with meals. 8. Lisinopril 2.5 mg daily. 9. Metoprolol 25 mg b.i.d. 10.Multivitamins. 11.MiraLax. 12.Hydrocodone/APAP p.r.n. 13.Zofran p.r.n. 14.Ambien 5 mg nightly p.r.n.  ALLERGIES:  SULFA, unknown reaction   SOCIAL HISTORY:  The patient is originally from [place], but now lives in [place] with a sister.  He is not married.  No children. Works in [employer],  doing packing.  Did drink approximately 2 beers per day.  No illicit drug use, but does chew tobacco.  Family History = Diabetes, otherwise non-contributory  PHYSICAL EXAMINATION:  VITAL SIGNS:  He is afebrile at 98.3, pulse of 60, blood pressure 100/46, saturation __________ 100% on room air. GENERAL:  This is a chronically ill-appearing gentleman, but not toxic, sitting comfortably in a chair. HEENT:  Normocephalic, atraumatic.  Constant bitemporal wasting, poor dentition.  No scleral icterus.  No subconjunctival petechiae.  No posterior pharynx erythema or thrush. NECK:  Supple.  No lymphadenopathy.  No JVD. CARDIAC EXAM:  Normal S1, S2 with a 3/6 systolic murmur both heard in right upper and left upper sternal border as well as radiating to the apex. PULMONARY EXAM:  Clear to auscultation bilaterally.  No wheezes, crackles, or rhonchi. ABDOMINAL EXAM:  Soft, nontender, nondistended.  Positive bowel sounds. No hepatosplenomegaly.  No rebound. GU:  The patient has a Foley in place with a penile ulcer, thought due to the allergy from the catheter.  Fresh blood noted in his urine. EXTREMITIES:  Hyperpigmentation of his legs and no hair, that can be due to current venostasis and peripheral vascular disease.  Does have 1+ pitting edema bilaterally and not warm to touch, otherwise no clubbing or cyanosis. NEUROLOGIC EXAM:  Cranial nerves II through XI are grossly intact. Motor is 5/5 in the upper extremities, 3/5 in the lower extremities, and decreased sensations through the hands and below the knees.  LABS:  White count is 16.9 with 89% neutrophils, hemoglobin 12.3, hematocrit 33.  His UA did show large leukocyte esterase and too many to count white blood cells and many bacteria.  Creatinine is 0.17.  MICROBIOLOGY: 1. Urine culture on October 4 shows 100,000 colonies of methicillin-     sensitive Staph aureus, sensitive to everything except for     penicillin. 2. Four on four  blood cultures on October 4, positive for MSSA,     resistant to clindamycin and erythromycin, clindamycin is less than     0.5, levo 0.25, oxacillin 0.5, penicillin 0.06, rifampin was 0.5,     Bactrim was less than 10, vanco less than 0.5, tetracycline less     than 1, moxifloxacin less than 0.25.  RADIOLOGY:  Chest x-ray shows no acute focal infiltrate.  ASSESSMENT AND PLAN:  This is a 50 year old Caucasian male with history of diabetes and peripheral neuropathy as well as urinary retention with a chronic Foley, presented on October 4th with SIRS and found to have urinary tract infection.  His infectious workup showed methicillin-sensitive Staphylococcus aureus in his urine as well as bacteremia.  This disseminated infection is concerning for endocarditis.    The patient has been on vancomycin and has subsequently defervesced with no longer having any fevers, however, there  has not been any repeat blood cultures to note if he has cleared his bacteremia.  1. We recommended to have transesophageal echocardiogram to evaluate     for vegetations.  The patient is noted to have history of murmur,     came back in the past year, however, no recent echocardiogram to     evaluate for vegetation that has been done thus far.  2. if blood cultures are negative,to document clearance, aPICC line     can be placed.  The patient will be needed to be treated for at     least 6 weeks.  3. Also have Urology remove his Foley and evaluate the     penile ulcer that has developed.  his foley has been the original one placed since his bacteremia nad uti was diagnosed during this admission.  It has been a pleasure to consult on mr. Rubendall.  greater than 45 minutes was taken to review his old records, current admission , and counsel the patient for this consultation          ______________________________ Judyann Munson, MD     CS/MEDQ  D:  09/29/2011  T:  09/29/2011  Job:  [id number]  Electronically  Signed by Judyann Munson MD on 09/29/2011 02:32:01 PM

## 2011-09-30 ENCOUNTER — Ambulatory Visit (HOSPITAL_COMMUNITY)
Admission: AD | Admit: 2011-09-30 | Discharge: 2011-09-30 | Disposition: A | Discharge status: Home / Self Care | Payer: Managed Care, Other (non HMO) | Source: Other Acute Inpatient Hospital | Attending: Internal Medicine | Admitting: Internal Medicine

## 2011-09-30 DIAGNOSIS — I339 Acute and subacute endocarditis, unspecified: Secondary | ICD-10-CM

## 2011-09-30 DIAGNOSIS — I330 Acute and subacute infective endocarditis: Secondary | ICD-10-CM

## 2011-09-30 DIAGNOSIS — R7881 Bacteremia: Secondary | ICD-10-CM

## 2011-09-30 LAB — GLUCOSE, CAPILLARY
Glucose-Capillary: 212 mg/dL — ABNORMAL HIGH (ref 70–99)
Glucose-Capillary: 168 mg/dL — ABNORMAL HIGH (ref 70–99)
Glucose-Capillary: 272 mg/dL — ABNORMAL HIGH (ref 70–99)
Glucose-Capillary: 213 mg/dL — ABNORMAL HIGH (ref 70–99)

## 2011-09-30 LAB — HEPATITIS PANEL, ACUTE: HCV Ab: NEGATIVE

## 2011-09-30 LAB — BASIC METABOLIC PANEL
Chloride: 98 mEq/L (ref 96–112)
Creatinine, Ser: 0.64 mg/dL (ref 0.50–1.35)
GFR calc Af Amer: >90 mL/min (ref >90)
Sodium: 133 mEq/L — ABNORMAL LOW (ref 135–145)

## 2011-10-01 LAB — HEMOGLOBIN AND HEMATOCRIT, BLOOD
HCT: 23.7 % — ABNORMAL LOW (ref 39.0–52.0)
HCT: 23.8 % — ABNORMAL LOW (ref 39.0–52.0)
HCT: 24.2 % — ABNORMAL LOW (ref 39.0–52.0)
HCT: 25.9 % — ABNORMAL LOW (ref 39.0–52.0)
Hemoglobin: 8 g/dL — ABNORMAL LOW (ref 13.0–17.0)
Hemoglobin: 8.1 g/dL — ABNORMAL LOW (ref 13.0–17.0)

## 2011-10-01 LAB — BASIC METABOLIC PANEL
CO2: 30 mEq/L (ref 19–32)
Calcium: 7.9 mg/dL — ABNORMAL LOW (ref 8.4–10.5)
Creatinine, Ser: 0.83 mg/dL (ref 0.50–1.35)
GFR calc non Af Amer: >90 mL/min (ref >90)
Glucose, Bld: 168 mg/dL — ABNORMAL HIGH (ref 70–99)

## 2011-10-01 LAB — ABO/RH: ABO/RH(D): B POS

## 2011-10-01 LAB — GLUCOSE, CAPILLARY: Glucose-Capillary: 231 mg/dL — ABNORMAL HIGH (ref 70–99)

## 2011-10-02 ENCOUNTER — Inpatient Hospital Stay (HOSPITAL_COMMUNITY): Payer: Managed Care, Other (non HMO)

## 2011-10-02 DIAGNOSIS — D6489 Other specified anemias: Secondary | ICD-10-CM

## 2011-10-02 DIAGNOSIS — K922 Gastrointestinal hemorrhage, unspecified: Secondary | ICD-10-CM

## 2011-10-02 LAB — IRON AND TIBC
Iron: 24 ug/dL — ABNORMAL LOW (ref 42–135)
Saturation Ratios: 17 % — ABNORMAL LOW (ref 20–55)
TIBC: 138 ug/dL — ABNORMAL LOW (ref 215–435)
UIBC: 114 ug/dL — ABNORMAL LOW (ref 125–400)

## 2011-10-02 LAB — BASIC METABOLIC PANEL
CO2: 31 mEq/L (ref 19–32)
Calcium: 8 mg/dL — ABNORMAL LOW (ref 8.4–10.5)
Creatinine, Ser: 0.95 mg/dL (ref 0.50–1.35)
GFR calc Af Amer: >90 mL/min (ref >90)
GFR calc non Af Amer: >90 mL/min (ref >90)

## 2011-10-02 LAB — CBC
MCH: 29.8 pg (ref 26.0–34.0)
MCHC: 34.2 g/dL (ref 30.0–36.0)
MCV: 87 fL (ref 78.0–100.0)
Platelets: 191 10*3/uL (ref 150–400)
RDW: 13.6 % (ref 11.5–15.5)

## 2011-10-02 LAB — GLUCOSE, CAPILLARY: Glucose-Capillary: 169 mg/dL — ABNORMAL HIGH (ref 70–99)

## 2011-10-02 MED ORDER — IOHEXOL 300 MG/ML  SOLN
100.0000 mL | Freq: Once | INTRAMUSCULAR | Status: AC | PRN
  Administered 2011-10-02: 100 mL via INTRAVENOUS

## 2011-10-03 DIAGNOSIS — K921 Melena: Secondary | ICD-10-CM

## 2011-10-03 DIAGNOSIS — D6489 Other specified anemias: Secondary | ICD-10-CM

## 2011-10-03 DIAGNOSIS — K922 Gastrointestinal hemorrhage, unspecified: Secondary | ICD-10-CM

## 2011-10-03 LAB — CBC
HCT: 21.1 % — ABNORMAL LOW (ref 39.0–52.0)
Hemoglobin: 7.2 g/dL — ABNORMAL LOW (ref 13.0–17.0)
Hemoglobin: 7.5 g/dL — ABNORMAL LOW (ref 13.0–17.0)
MCH: 29.4 pg (ref 26.0–34.0)
MCV: 87.8 fL (ref 78.0–100.0)
RBC: 2.55 MIL/uL — ABNORMAL LOW (ref 4.22–5.81)
RDW: 13.5 % (ref 11.5–15.5)
WBC: 7.2 10*3/uL (ref 4.0–10.5)
WBC: 7.2 10*3/uL (ref 4.0–10.5)

## 2011-10-03 LAB — GLUCOSE, CAPILLARY: Glucose-Capillary: 286 mg/dL — ABNORMAL HIGH (ref 70–99)

## 2011-10-03 LAB — BASIC METABOLIC PANEL
BUN: 19 mg/dL (ref 6–23)
CO2: 31 mEq/L (ref 19–32)
Chloride: 97 mEq/L (ref 96–112)
Creatinine, Ser: 0.91 mg/dL (ref 0.50–1.35)
Glucose, Bld: 125 mg/dL — ABNORMAL HIGH (ref 70–99)

## 2011-10-03 LAB — COMPREHENSIVE METABOLIC PANEL
ALT: 8 U/L (ref 0–53)
AST: 21 U/L (ref 0–37)
CO2: 31 mEq/L (ref 19–32)
Calcium: 8.1 mg/dL — ABNORMAL LOW (ref 8.4–10.5)
Chloride: 97 mEq/L (ref 96–112)
GFR calc Af Amer: >90 mL/min (ref >90)
GFR calc non Af Amer: >90 mL/min (ref >90)
Glucose, Bld: 95 mg/dL (ref 70–99)
Sodium: 136 mEq/L (ref 135–145)
Total Bilirubin: 0.6 mg/dL (ref 0.3–1.2)

## 2011-10-03 LAB — TYPE AND SCREEN

## 2011-10-04 LAB — CROSSMATCH: Unit division: 0

## 2011-10-04 LAB — BASIC METABOLIC PANEL
CO2: 34 mEq/L — ABNORMAL HIGH (ref 19–32)
Calcium: 8.2 mg/dL — ABNORMAL LOW (ref 8.4–10.5)
Chloride: 98 mEq/L (ref 96–112)
Glucose, Bld: 115 mg/dL — ABNORMAL HIGH (ref 70–99)
Potassium: 2.9 mEq/L — ABNORMAL LOW (ref 3.5–5.1)
Sodium: 136 mEq/L (ref 135–145)

## 2011-10-04 LAB — GLUCOSE, CAPILLARY
Glucose-Capillary: 305 mg/dL — ABNORMAL HIGH (ref 70–99)
Glucose-Capillary: 98 mg/dL (ref 70–99)
Glucose-Capillary: 96 mg/dL (ref 70–99)
Glucose-Capillary: 134 mg/dL — ABNORMAL HIGH (ref 70–99)
Glucose-Capillary: 258 mg/dL — ABNORMAL HIGH (ref 70–99)

## 2011-10-04 LAB — CBC
Hemoglobin: 7.1 g/dL — ABNORMAL LOW (ref 13.0–17.0)
MCH: 29.6 pg (ref 26.0–34.0)
Platelets: 179 10*3/uL (ref 150–400)
RBC: 2.4 MIL/uL — ABNORMAL LOW (ref 4.22–5.81)
WBC: 5.3 10*3/uL (ref 4.0–10.5)

## 2011-10-05 ENCOUNTER — Inpatient Hospital Stay (HOSPITAL_COMMUNITY): Payer: Managed Care, Other (non HMO)

## 2011-10-05 ENCOUNTER — Encounter: Payer: Self-pay | Admitting: Family Medicine

## 2011-10-05 ENCOUNTER — Other Ambulatory Visit: Payer: Self-pay | Admitting: *Deleted

## 2011-10-05 LAB — CULTURE, BLOOD (ROUTINE X 2)
Culture  Setup Time: 201210110043
Culture: NO GROWTH
Culture: NO GROWTH

## 2011-10-05 LAB — DIFFERENTIAL
Basophils Absolute: 0.1 10*3/uL (ref 0.0–0.1)
Eosinophils Relative: 1 % (ref 0–5)
Lymphocytes Relative: 11 % — ABNORMAL LOW (ref 12–46)
Lymphs Abs: 0.5 10*3/uL — ABNORMAL LOW (ref 0.7–4.0)
Neutro Abs: 3.9 10*3/uL (ref 1.7–7.7)
Neutrophils Relative %: 78 % — ABNORMAL HIGH (ref 43–77)

## 2011-10-05 LAB — BASIC METABOLIC PANEL
BUN: 13 mg/dL (ref 6–23)
CO2: 34 mEq/L — ABNORMAL HIGH (ref 19–32)
Chloride: 101 mEq/L (ref 96–112)
GFR calc Af Amer: >90 mL/min (ref >90)
Potassium: 3.2 mEq/L — ABNORMAL LOW (ref 3.5–5.1)

## 2011-10-05 LAB — CBC
HCT: 21.9 % — ABNORMAL LOW (ref 39.0–52.0)
Hemoglobin: 7.4 g/dL — ABNORMAL LOW (ref 13.0–17.0)
RBC: 2.5 MIL/uL — ABNORMAL LOW (ref 4.22–5.81)
RDW: 13.4 % (ref 11.5–15.5)
WBC: 5 10*3/uL (ref 4.0–10.5)

## 2011-10-05 LAB — GLUCOSE, CAPILLARY: Glucose-Capillary: 161 mg/dL — ABNORMAL HIGH (ref 70–99)

## 2011-10-06 LAB — BASIC METABOLIC PANEL
Calcium: 8.4 mg/dL (ref 8.4–10.5)
Chloride: 100 mEq/L (ref 96–112)
Creatinine, Ser: 0.86 mg/dL (ref 0.50–1.35)
GFR calc Af Amer: >90 mL/min (ref >90)
Sodium: 136 mEq/L (ref 135–145)

## 2011-10-06 LAB — GLUCOSE, CAPILLARY: Glucose-Capillary: 254 mg/dL — ABNORMAL HIGH (ref 70–99)

## 2011-10-07 ENCOUNTER — Encounter: Payer: Self-pay | Admitting: Family Medicine

## 2011-10-09 NOTE — Discharge Summary (Signed)
[name], [name] NO.:  [id number]  MEDICAL RECORD NO.:  [id number]  LOCATION:  1519                         FACILITY:  [facility]  PHYSICIAN:  Brendia Sacks, MD    DATE OF BIRTH:  April 17, 1961  DATE OF ADMISSION:  09/22/2011 DATE OF DISCHARGE:  10/06/2011                              DISCHARGE SUMMARY   PRIMARY CARE PHYSICIAN:  [name], MD  PRIMARY GASTROENTEROLOGIST:  Hedwig Morton. Juanda Chance, MD  PRIMARY NEUROLOGIST:  Denton Meek, MD  PRIMARY UROLOGIST:  Excell Seltzer. Annabell Howells, MD  CONDITION ON DISCHARGE:  Improved.  DISPOSITION:  Home with home health physical therapy and nursing care for IV antibiotics.  DISCHARGE DIAGNOSES: 1. Urinary tract infection/pyelonephritis secondary to methicillin-     susceptible Staphylococcus aureus.  Possible left kidney     subcapsular abscess. 2. History of chronic indwelling Foley, present on admission. 3. Methicillin-susceptible Staphylococcus aureus bacteremia. 4. Uncontrolled diabetes mellitus, insulin requiring. 5. Sensorimotor and autonomic neuropathy, chronic. 6. Hyponatremia, resolved. 7. Acute blood loss anemia, resolved. 8. Perirectal fistula, stable. 9. A 14 mm-hyperdense lesion in the right kidney, possibly cyst.     Recommend outpatient followup.  HISTORY OF PRESENT ILLNESS:  [name] who has a history of neuropathy, being followed by Dr. Modesto Charon and has had extensive evaluation in the outpatient setting.  He has had a chronic Foley from this.  He was seen by his urologist, found to be febrile, and sent to the hospital for further evaluation and treatment.  He was discovered to have bacteremia and was treated.  HOSPITAL COURSE:  [name] was admitted to the hospital here at [facility].  Ultimately, he was diagnosed with MSSA bacteremia as well as MSSA urinary tract infection and pyelonephritis.  He was put on appropriate antibiotics and seen in consultation with Infectious Disease.   The recommendation was to complete a 4-week course of Ancef.  Imaging near the end of that course could be considered to follow up on his abnormalities of the kidney.  I did discuss the abnormalities of the kidney and possible subcapsular abscesses with both Dr. Annabell Howells of Urology and with Dr. Drue Second of Infectious Disease.  The plan at this point is really to treat clinically and continue his 4-week therapy for MSSA bacteremia.  [name] in the next several weeks.  The patient had several episodes of lower GI bleeding and was seen in consultation with Gastroenterology.  A flexible sigmoidoscopy was performed, which did reveal a rectal fistula.  Because of concern for perforation, General Surgery was also consulted.  Indeed the patient was found to have a perirectal fistula, however, he did not require surgery. He had no evidence of abscess on imaging.  Subsequent drain placement was aborted as there was no evidence of abscess.  The etiology of this perirectal abscess is unclear, but at this point, a week of antibiotics per Dr. Juanda Chance has been recommended and also by General Surgery. General Surgery does not feel the patient requires any followup from their standpoint, unless therre is a change in his condition.  He will follow up with Dr. Juanda Chance in approximately 1  month for consideration for repeat endoscopy to assess for healing of this fistula.  The patient has uncontrolled diabetes mellitus by hemoglobin A1c.  This has been stable during this hospitalization.  The patient has been started on Lantus and will continue to receive diabetic teaching and outpatient referral if clinically desired.  I have discussed the case with General Surgery as well as Infectious Disease and Urology.  The patient appears to be clinically stable for discharge at this point.  CONSULTATIONS: 1. Neurology, Dr. Modesto Charon continues to follow up the patient in the     outpatient  setting for his neurologic issues.  No further     neurologic workup performed during this hospitalization. 2. Infectious Disease.  Ultimate recommendations were for Ancef 2 g IV     q.8 hours for a total of 4 weeks. 3. Urology.  At this point, Dr. Annabell Howells recommends continued IV therapy,     there does not appear to be a role for surgical intervention.  He     will follow him up in the outpatient setting. 4. Gastroenterology.  Their recommendations as above. 5. [facility].  Their recommendations as above. 6. Interventional Radiology for consideration for drain placement.     Aborted as there was no evidence of abscess.  Could consider outpatient consultation for diabetic education.  PROCEDURES: 1. PICC line placement, October 11th. 2. Transesophageal echocardiogram, October 12th; showed a left     ventricular ejection fraction of 55% to 60%, bicuspid aortic valve.     No evidence of vegetations seen. 3. Flexible sigmoidoscopy, October 16th; rectal fistula seen.  MICROBIOLOGY: 1. Urine culture, October 4th; MSSA. 2. Blood cultures x2, October 4th; MSSA. 3. Blood cultures x2, October 10th; no growth, final.  PERTINENT LABORATORY STUDIES: 1. Clostridium difficile PCR screen was negative, October 17th. 2. Acute hepatitis panel was negative. 3. Hemoglobin A1c was 10.9. 4. Hemoglobin notable for 7.4 on discharge and stable. 5. Capillary blood sugar stable. 6. Basic metabolic panel, unremarkable on discharge with a BUN of 12     and a creatinine of 0.86.  IMAGING: 1. Chest x-ray, October 4th; no acute abnormality seen. 2. Abdominal ultrasound, October 11th; limited study.  Mild right     hydronephrosis. 3. CT of the abdomen and pelvis, October 14th; small amount of     perihepatic fluid with cirrhosis.  Abnormal kidneys bilaterally.     Question of subcapsular renal abscesses on the left with segmental     edema on the right kidney suggesting pyelonephritis.  A 14-mm      hyperdense lesion in the right kidney, may be a cyst.  Solid     neoplasm can have a similar appearance.  Consider MRI in the     outpatient setting.  Free air in the extraperitoneal pelvic floor     concerning for rectal perforation. 4. CT of the abdomen and pelvis, October 14th, with contrast showed     rectal perforation.  Again, as seen, markedly abnormal left kidney     concerning for possible pyelonephritis and abscesses. 5. Limited CT of the pelvis, October 17th, showed tiny perirectal     fistula.  No developing abscess seen.  PHYSICAL EXAMINATION ON DISCHARGE:  GENERAL:  The patient is feeling quite well.  He did have some diarrhea last night, but this has not been unusual for him as he has difficulty with bowel incontinence at night which is on going and followed by Dr. Modesto Charon. VITAL SIGNS:  Temperature is  98.3, pulse 72, respirations 18, blood pressure 104/51, saturation 97% on room air. CARDIOVASCULAR:  Regular rate and rhythm, 2/6 systolic murmur.  No rub or gallop.  Trace to 1+ ankle edema, right greater than left. RESPIRATORY:  Clear to auscultation bilaterally.  No wheezes, rales, or rhonchi.  Normal respiratory effort.  DISCHARGE INSTRUCTIONS:  The patient will be discharged home with home health physical therapy, rolling walker, and IV antibiotics.  DIET INSTRUCTION:  Low-residue, diabetic diet.  ACTIVITY:  Use a walker.  FOLLOWUP:  Follow up with Dr. Annabell Howells in approximately 2 weeks, Dr. Denton Meek in approximately 2 weeks, Dr. [name] in approximately 1 month, and Dr. Nicoletta Ba in approximately 1 week.  NEW DISCHARGE MEDICATIONS: 1. Ciprofloxacin 500 mg p.o. b.i.d., to complete a week's course. 2. Ancef 2 g IV q.8 hours, last dose October 29, 2011, for a 4-week     course. 3. Lasix 40 mg p.o. daily for the next 7 days.  He is on this     medication temporarily to assist with clearance of peripheral edema     secondary to acute illness. 4. Sliding  scale insulin pen at night.  For blood sugar 70 to 200, no     insulin; for blood sugar 201 to 250, give 2 units; for blood sugar     251 to 300, give 3 units; for blood sugar 301 to 350, give 4 units;     for blood sugar 351 to 400, give 5 units; for blood sugar greater     than 400, call physician. 5. Sliding scale insulin with meals.  For blood sugars 70 to 120, no     insulin; for blood sugar 121 to 150, give 1 unit; for blood sugar     151 to 200, give 2 units; for blood sugar 201 to 250, give 3 units;     for blood sugar 251 to 300, give 5 units; for blood sugar 301 to     350, give 7 units; for blood sugar 351 to 400, give 9 units; for     blood sugar greater than 400, call physician.  Administer insulin     aspartate subcutaneously. 6. Insulin, glargine 45 units subcu at bedtime. 7. Metronidazole 250 mg p.o. t.i.d., to complete a week's course. 8. Nutritional supplement like Glucerna p.o. b.i.d. between meals. 9. Lisinopril 2.5 mg p.o. daily. 10.Multivitamin p.o. daily. 11.Tylenol 500 mg 2 tablets every 8 hours as needed for pain.  Discontinue the following medications; 1. Victoza. 2. ACTOplus metformin.  Time coordinating discharge including speaking with Dr. Annabell Howells as well as Dr. Drue Second of Infectious Disease and Dr. Corliss Skains of General Surgery, as well as complete review of the patient's hospitalization and discussionof his discharge with the patient and his sister, 90 minutes.     Brendia Sacks, MD     DG/MEDQ  D:  10/06/2011  T:  10/06/2011  Job:  [id number]  cc:   [name], MD Fax: [phone]  Hedwig Morton. Juanda Chance, MD [address] [place] [address]  Denton Meek, MD  Excell Seltzer. Annabell Howells, M.D. Fax: [phone]  Electronically Signed by Brendia Sacks  on 10/09/2011 07:50:31 AM

## 2011-10-10 ENCOUNTER — Encounter: Payer: Self-pay | Admitting: Family Medicine

## 2011-10-10 ENCOUNTER — Telehealth: Payer: Self-pay | Admitting: Family Medicine

## 2011-10-10 ENCOUNTER — Ambulatory Visit (INDEPENDENT_AMBULATORY_CARE_PROVIDER_SITE_OTHER): Payer: Managed Care, Other (non HMO) | Admitting: Family Medicine

## 2011-10-10 VITALS — BP 102/76 | HR 72 | Temp 97.7°F | Ht 69.0 in | Wt 186.0 lb

## 2011-10-10 DIAGNOSIS — N12 Tubulo-interstitial nephritis, not specified as acute or chronic: Secondary | ICD-10-CM

## 2011-10-10 DIAGNOSIS — D500 Iron deficiency anemia secondary to blood loss (chronic): Secondary | ICD-10-CM | POA: Insufficient documentation

## 2011-10-10 DIAGNOSIS — K766 Portal hypertension: Secondary | ICD-10-CM | POA: Insufficient documentation

## 2011-10-10 DIAGNOSIS — G629 Polyneuropathy, unspecified: Secondary | ICD-10-CM

## 2011-10-10 DIAGNOSIS — R609 Edema, unspecified: Secondary | ICD-10-CM

## 2011-10-10 DIAGNOSIS — G609 Hereditary and idiopathic neuropathy, unspecified: Secondary | ICD-10-CM

## 2011-10-10 MED ORDER — SPIRONOLACTONE 50 MG PO TABS: 50.0000 mg | ORAL_TABLET | Freq: Every day | ORAL | Status: DC

## 2011-10-10 MED ORDER — MEDICAL COMPRESSION STOCKINGS MISC: 1.0000 [IU] | Freq: Every day | Status: DC | PRN

## 2011-10-10 MED ORDER — INSULIN PEN NEEDLE 32G X 6 MM MISC: Status: DC

## 2011-10-10 MED ORDER — FUROSEMIDE 40 MG PO TABS: 40.0000 mg | ORAL_TABLET | Freq: Every day | ORAL | Status: DC

## 2011-10-10 NOTE — Assessment & Plan Note (Addendum)
Ascites improved. Still with generous amount of LE edema, plus some of his GI symptoms (mild upset stomach with eating, postprandial loose BMs) could be from bowel edema. Will continue lasix 40mg  qd and add spironolactone 50mg  qd.  Rx compression stockings. Will add electrolytes to labs drawn today by [facility] nursing.

## 2011-10-10 NOTE — Assessment & Plan Note (Addendum)
Pelvic autonomic--  ?DM 2 vs inflammitory polyneuropathy? Urinary retention component seems to be resolved. He is seeing Dr. Annabell Howells later today for urologic f/u.  W/u per Dr. Modesto Charon and [facility] hosp; next step is EMGs and NCS at [facility].  Has f/u with Dr. Modesto Charon 10/25/11.

## 2011-10-10 NOTE — Telephone Encounter (Signed)
Please call [facility] and arrange a hospital follow up appt with Dr. [name] in about 1 month (for rectal perforation).  She already saw him in the hospital recently so they should not require an actual referral.--PM

## 2011-10-10 NOTE — Assessment & Plan Note (Signed)
Hb stable. Will have him add FeSO4 325mg  bid. Follow Hb again in 56mo.

## 2011-10-10 NOTE — Progress Notes (Signed)
OFFICE VISIT  10/10/2011   CC:  Chief Complaint  Patient presents with  . Follow-up    hospital follow up, meds     HPI:    Patient is [name] who presents with his sister today for hospital f/u for MSSA pyelonephritis and bacteremia. HH nursing is set up for him to get/complete a 4 wk course of IV ancef.  His foley cath was removed and he has been urinating on his own without problem. He has been started on lasix for peripheral edema and ascites (newly discovered cirrhosis, presumed . Also started on lantus and mealtime novolog in hospital, Vicotoza and ActosPlusMet have been d/c'd. Was found to be anemic in hosp secondary to acute blood loss (rectal perf, UTI), Hb 7.4 at d/c.  He was not transfused. Rectal perf in hosp, unknown etiology, seen by Dr. Juanda Chance and gen surg--Flagyl and cipro x 7d recommended, with rec's to f/u with GI in 6mo (not set up). No intervention done as far as his peripheral neuropathy per pt report.  His only complaint today is generalized weakness/tired.  Says he's also still having a few loose stools per day, none with blood or mucous. Appetite is good, fluid intake good.    Past Medical History  Diagnosis Date  . Low back pain 04/2011    Plain film 04/2011: spondylosis/old osteophyte vs endplate fx L3  . Abnormal EKG   . Heart murmur     Diastolic (06/30/11): ECHO 07/2011 showed mild AS and LVH (but no problem to correlate with diastolic murmur).  TEE 09/2011 showed small PFO, bicuspid aortic valve but no stenosis or regurg, small pericardial effusion.  . Hypertension 06/2011  . Hematuria 07/27/2011, 08/25/11  . UTI (lower urinary tract infection) 07/2011    MSSA  . Diabetes mellitus   . 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 Perforation of large intestine 09/2011    unknown etiology, fistula noted on CT, no abscess (10/05/11).  Gen surg and GI recommended flagyl and cipro x 2 wks.  . Cholelithiasis 09/2011  .  Pyelonephritis 09/2011    MSSA (+bacteremia); iv ancef via HH x 4 wks  . Alcoholism   . 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 Knee arthroscopy     Bilateral   Current meds: ancef 2g IV q8h, cipro 500mg  bid x7d, flagyl 500mg  tid x 7d, lantus 45 U SQ qhs, novolog insulin per SS at mealtime, lisinopril 2.5mg  qd,  Outpatient Prescriptions Prior to Visit  Medication Sig Dispense Refill  . lisinopril (PRINIVIL,ZESTRIL) 2.5 MG tablet Take 1 tablet (2.5 mg total) by mouth daily.  30 tablet  2  . Multiple Vitamins-Minerals (MULTI FOR HIM) PACK Take 1 each by mouth daily.        [name] glucose blood (FREESTYLE LITE) test strip PRN  32 each  12  . Lancets (FREESTYLE) lancets PRN  100 each  5  . Insulin Pen Needle (NOVOTWIST) 32G X 5 MM MISC Use with Victoza pen to inject med twice daily SQ  60 each  6  . Liraglutide (VICTOZA) 18 MG/3ML SOLN 1.79ml SQ bid  9 mL  6  . pioglitazone-metformin (ACTOPLUS MET) 15-500 MG per tablet 1 tab po qAM and 2 tabs po qPM  90 tablet  3  **Note: victoza and actosplus met have been d/c'd.  Allergies  Allergen Reactions  . Sulfa Antibiotics   .  Naproxen Nausea Only    ROS As per HPI  PE: Blood pressure 102/76, pulse 72, temperature 97.7 F (36.5 C), temperature source Oral, height 5\' 9"  (1.753 m), weight 186 lb (84.369 kg), SpO2 98.00%. Gen: Alert, well appearing.  Patient is oriented to person, place, time, and situation. ENT: no icteris.  Oropharynx: no erythema, swelling, or exudate. Neck: soft, no LAD. Chest: symmetric expansion, nonlabored respirations.  Clear and equal breath sounds in all lung fields.   CV: RRR, soft systolic murmur, ? trace diastolic murmur, no rub or gallop.  ABD: soft, NT, ND, BS normal.  No HSM or mass.  No bruit. EXT: nontender pitting edema (3+) from mid tibial level into dorsum of feet bilat.  No skin breakdown but bronzing and hyperkeratosis evident diffusely in LE's.  No  erythema, no warmth.  LABS:  Via [facility] nursing today:  WBC 5.0, 78% PMNs.  Hb 8.2 with MCV 92.3, Plt 155 BUN 13  Cr 1.2  IMPRESSION AND PLAN:  Portal hypertension Ascites improved. Still with generous amount of LE edema, plus some of his GI symptoms (mild upset stomach with eating, postprandial loose BMs) could be from bowel edema. Will continue lasix 40mg  qd and add spironolactone 50mg  qd.  Rx compression stockings. Will add electrolytes to labs drawn today by [facility] nursing.  Peripheral neuropathy Pelvic autonomic--  ?DM 2 vs inflammitory polyneuropathy? Urinary retention component seems to be resolved. He is seeing Dr. Annabell Howells later today for urologic f/u.  W/u per Dr. Modesto Charon and [facility] hosp; next step is EMGs and NCS at [facility].  Has f/u with Dr. Modesto Charon 10/25/11.  Blood loss anemia Hb stable. Will have him add FeSO4 325mg  bid. Follow Hb again in 84mo.  Pyelonephritis Continue ancef IV as per ID rec's in hospital. Indwelling foley cath is now OUT. Urologic f/u scheduled for later today with Dr. Annabell Howells.  Type II or unspecified type diabetes mellitus without mention of complication, uncontrolled Continue lantus and mealtime humalog. Check glucose tid-qid until under better control. May be able to get back to orals or GLP agonist in the next 4-6 months. Recheck HbA1c in 28mo.     FOLLOW UP: Return in about 1 week (around 10/17/2011) for f/u edema and MSSA infxn.

## 2011-10-10 NOTE — Assessment & Plan Note (Signed)
Continue lantus and mealtime humalog. Check glucose tid-qid until under better control. May be able to get back to orals or GLP agonist in the next 4-6 months. Recheck HbA1c in 55mo.

## 2011-10-10 NOTE — Assessment & Plan Note (Signed)
Continue ancef IV as per ID rec's in hospital. Indwelling foley cath is now OUT. Urologic f/u scheduled for later today with Dr. Annabell Howells.

## 2011-10-11 ENCOUNTER — Telehealth: Payer: Self-pay

## 2011-10-11 ENCOUNTER — Encounter: Payer: Self-pay | Admitting: Internal Medicine

## 2011-10-11 ENCOUNTER — Other Ambulatory Visit: Payer: Self-pay | Admitting: Family Medicine

## 2011-10-11 MED ORDER — FERROUS SULFATE 325 (65 FE) MG PO TABS: 325.0000 mg | ORAL_TABLET | Freq: Two times a day (BID) | ORAL | Status: DC

## 2011-10-11 NOTE — Telephone Encounter (Signed)
Was on Lisinopril at the hospital and the hospital told him to stop but he is still taking it since he got home and his blood pressure is regulated? Pts sister would like to know if he should continue taking it? Please advise? [phone] Eunice Blase)

## 2011-10-11 NOTE — Telephone Encounter (Signed)
I do want him to CONTINUE his lisinopril.  Thx--PM

## 2011-10-11 NOTE — Progress Notes (Signed)
Per [facility] ([facility]), the hospital did not order any IV abx at d/c. He currently has only a weekly CBC ordered through [facility]. Then I spoke w/ patient today and he says he has a PICC line and he was taught to give himself the ancef IV q8h and apparently he was given rx for the med, says another box is coming tomorrow.  Will clarify all this further with [facility] agency and also add BMET to his weekly lab via [facility]. Also notified pt that he needs to start FeSO4 325mg  bid for his anemia.

## 2011-10-11 NOTE — Telephone Encounter (Signed)
pts sister informed

## 2011-10-11 NOTE — Telephone Encounter (Signed)
Appt with Dr Juanda Chance 11/14/11 9:15, patient aware

## 2011-10-12 NOTE — Consult Note (Signed)
[name], NADAL NO.:  [id number]  MEDICAL RECORD NO.:  [id number]  LOCATION:  1519                         FACILITY:  [facility]  PHYSICIAN:  Wilmon Arms. Corliss Skains, M.D. DATE OF BIRTH:  11/17/1961  DATE OF CONSULTATION:  10/03/2011 DATE OF DISCHARGE:                                CONSULTATION   TIME OF CONSULTATION:  11:50 am.  REFERRING PHYSICIAN:  Dr. David Stall.  REASON FOR CONSULTATION:  Possible rectal perforation.  BRIEF HISTORY:  The patient is a 50 year old male who was admitted on September 22, 2011 with indwelling Foley, subsequent urinary tract infection, adult onset diabetes mellitus, and bacteremia.  His sister noted he had blood coming from his rectum on that day.  His hemoglobin and hematocrit on admission was 12 and 35.  He has been followed and has had guaiac-positive stools with a hemoglobin dropping down to 8.2 on October 12th and down to 7.8 on October 14th with hematocrit of 22.  He has just been seen by Dr. Juanda Chance and GI consult was obtained yesterday. He is currently nontender, but he does have blood on his rectal exam..  The patient was seen yesterday by GI service, CT finding shows kidneys were  abnormal with a left subscapular abscess, distended gallbladder, free air on extraperitoneal fore- tracking between the rectum and the prostate.  The rectal versus urethral and his progressive anemia led to surgical consult. GI has held off on endoscopic or colonoscopy at this point.  He recommended urology consult and we were contacted to see him this morning.  CT scan yesterday as noted above shows cirrhosis and mild ascites.  There is left renal abscess or pyelonephritis.  There is gas in the pelvic floor between the rectum and the prostate and this was concerning for rectal perforation.  PAST MEDICAL HISTORY: 1. 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  He had lower extremity weakness and  urinary     retention and had an indwelling catheter. 2. She has had extensive workup here in [place] and at [facility],     reportedly negative for HIV, hepatitis B, and hepatitis C. 3. Adult onset diabetes mellitus since around 2001 with an admission     hemoglobin A1c of 10.9. 4. Hypertension. 5. He has had bilateral arthroscopies of both knees and has arthritis     in his back with back pain when it is cold.  PAST SURGICAL HISTORY:  He has had bilateral arthroscopies.  He had a transesophageal echo, September 30, 2011, which shows an EF of 55% to 60%, and bicuspid aortic valve.  There were no valvular vegetations.  He had trivial AR.  There was a patent foramen ovale.  FAMILY HISTORY:  Father is deceased at 95 with an MI.  Mother died in her 30s with a question of renal cell carcinoma.  He has 4 sisters with history of thyroid disease and hypertension.  He has 1 brother with history of DVT, 1 who is handicapped after what sounds like a toxic brain injury at birth with seizures and about 6 grade mental capacity.  SOCIAL HISTORY:  He does not smoke,  but he does chew a can tobacco a week.  He has been smoking for 30 years.  He says less than 16 beers per week.  Drugs:  None.  He works for [employer], makes Hospital doctor.  He lives with his sister and his handicapped brother.  He has had no sexual encounter since 2006.  REVIEW OF SYSTEMS:  CV:  Negative for headache, dizziness, or syncope. He has never had a stroke or seizure.  He did have fever on admission. CARDIAC:  Positive for a murmur.  No chest pain or palpitations. PULMONARY:  No orthopnea, PND, or dyspnea on exertion.  GI:  Negative for GERD.  Positive for diarrhea since he has been sick.  No problems with constipation.  He has had blood since September 22, 2011.  GU:  He says he is voiding well now.  The Foley is out.  He denies any rectal penetration by any foreign objects.  LOWER EXTREMITIES:  Negative for claudication.   Positive for edema.  MUSCULOSKELETAL:  He has back trouble when it is cold outside.  SKIN:  He has ecchymosis from Lovenox injections.  PSYCHIATRIC:  No changes.  HOME MEDICATIONS: 1. Victoza 18 mcg b.i.d. 2. Multivitamins 1 daily. 3. Actoplus 15/1000 mg b.i.d. 4. Lisinopril 2.5 mg daily. 5. Aspirin 81 mg daily.  HOSPITAL MEDICATIONS: 1. Aspirin 81 mg daily. 2. Ancef 2 g q.8 h. 3. Lasix 60 mg b.i.d. 4. Neurontin 100 mg daily. 5. Sliding scale and Lantus insulin. 6. Lisinopril 2.5 mg daily. 7. Lopressor 25 mg b.i.d. 8. Multivitamins 1 daily. 9. Protonix 40 mg b.i.d. 10.MiraLax 17 g daily. 11.KCl 20 mEq daily. 12.Thiamine 100 mg daily.  ALLERGIES:  None.  PHYSICAL EXAMINATION:  GENERAL:  This is a well-nourished and well- developed white male in no acute distress.  No discomfort. VITAL SIGNS:  Temperature is 98.4, heart rate is 79, blood pressure is 126/61, respiratory rate is 18, and saturation are 99% on room air. HEAD:  Normocephalic. EARS, NOSE, THROAT, AND MOUTH:  Grossly within normal limits. NECK:  Trachea is in the midline.  There are no bruits.  No JVD.  No thyromegaly. CHEST:  Clear to auscultation and percussion. CARDIAC:  He has a split S2.  He has a 3/6 systolic murmur heard at the aortic and left sternal border.  Pulses are +2 and equal in both the upper and lower extremities. ABDOMEN:  Bowel sounds are present.  The abdomen is nontender.  There is no palpable hepatosplenomegaly.  No masses, abscesses, or hernia. GU/RECTAL:  Deferred.  He just had a rectal performed by Dr. [name]. MUSCULOSKELETAL:  No joint changes. SKIN:  He has bilateral lower extremity edema with ecchymosis in his mid abdomen. NEUROLOGIC:  No focal deficits.  Cranial nerves are intact.  LABORATORY DATA:  White count yesterday is 7.6, hemoglobin is 7.8, and hematocrit is 22.8.  Stool guaiac, positive on October 12th.  Hemoglobin A1c 10.9 on September 23, 2011.  IMPRESSION: 1.  Hematochezia with air at the floor of the rectum between the     prostate with a question of rectal perforation. 2. History of difficult Foley insertion and question of urethral     injury. 3. Urinary tract infection with Staphylococcus aureus bacteremia. 4. Staphylococcus aureus urinary tract infection. 5. History of alcoholic cirrhosis. 6. Adult onset diabetes mellitus, insulin-dependent with poor control. 7. Peripheral neuropathy with small vessel vasculitis and urinary     retention and withdrawing Foley on admission, September 22, 2011.  PLAN:  The patient has been seen by Dr. [name].  He does not have an acute abdomen at this point.  CT was done yesterday.  He has been tolerating p.o.s well.  We will make him n.p.o.  I am going to update his labs and Dr. Corliss Skains will see him.  Dr. Juanda Chance is tentatively going to schedule him for flexible sigmoidoscopy tomorrow.  We will follow with you and I will discuss with Dr. Corliss Skains.     Eber Hong, P.A.   ______________________________ Wilmon Arms. Chapin Arduini, M.D.    WDJ/MEDQ  D:  10/03/2011  T:  10/03/2011  Job:  [id number]  cc:   Hedwig Morton. Juanda Chance, MD [address] [place] [address]  Wilmon Arms. Corliss Skains, M.D. [address] [place] [address] [place]  Marinda Elk, M.D.  Erick Blinks, MD Fax: [phone]  Electronically Signed by Sherrie George P.A. on 10/09/2011 03:00:30 PM Electronically Signed by Manus Rudd M.D. on 10/12/2011 04:38:37 AM

## 2011-10-14 NOTE — Progress Notes (Signed)
Ralph, [name] NO.:  [id number]  MEDICAL RECORD NO.:  [id number]  LOCATION:  ENDO                         FACILITY:  [facility]  PHYSICIAN:  [name], M.D.DATE OF BIRTH:  [birth year]                                PROGRESS NOTE   PRIMARY CARE DOCTOR: Ralph Massed, MD  DIAGNOSES: 1. Methicillin-susceptible Staphylococcus aureus bacteremia with     probable source of Foley catheter.  On day #6 of Ancef at the time     of the dictation with a PICC line placed and home health already     set up. 2. Diabetes type 2, currently well controlled.  His high blood glucose     is usually at night. 3. Rectal perforation, status post sigmoidoscopy that showed fistula.     IR consulted for further management. 4. Hyponatremia secondary to cirrhosis.  This has resolved. 5. Severe protein malnutrition secondary to his alcohol abuse and     cirrhosis, currently resolved. 6. Peripheral neuropathy, currently well controlled. 7. Hypokalemia. 8. Cirrhosis secondary to alcohol abuse. 9. 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 10.Pyelonephritis, methicillin-susceptible and possible subcapsular    abcess.  IMAGING: 1. Triple contrast CT scan of the abdomen and pelvis just showed     extraperitoneal air seen in prior study between the rectum and the     prostate filled with contrast after the administration of rectal     contrast compatible with rectal perforation. 2. Markedly abnormal left kidney with areas of abnormal enhancing     centrally and around the left kidney concerning for possible     pyelo/abscess. 3. Cirrhosis and splenomegaly.  CT scan of the abdomen and pelvis on     October 02, 2011 shows cirrhosis, abnormal kidneys bilateral with     subscapular renal abscess on the left with segmental edema in the     right.  There is a 40-mm hyperdense lesion in the right kidney, may     be a  cyst, complicated with proteinaceous debris or hemorrhage.     Distended bladder with layering tiny gallstones in the     extraperitoneal cavity floor, tracking between the prostate gland     and the rectum on the left. 4. Rectal urethral perforation, consider.  Abdominal ultrasound that     showed technically limited secondary to bowel gas artifact, mild     hydronephrosis.  Question extrahepatic biliary ductal dilation. 5. Chest x-ray on September 29, 2011 shows PICC line in place, no     perforations. 6. Chest x-ray on September 22, 2011 shows slightly low lung volumes with     no worrisome for focal acute abnormalities.  CONSULTANTS: 1. Dr. Lina [name]. 2. Dr. Corliss [name], General Surgery and Interventional Radiology.  BRIEF HOSPITAL COURSE: 1. MSSA bacteremia.  Blood cultures came back positive.  He was     started on Rocephin, initially thinking this was urinary tract     infection.  Once we knew it was Staph aureus, he was changed to     vancomycin.  TEE was done, which was negative.  ID was consulted     and recommended to get a TEE.  TEE shows small PFO.  PICC line was     placed.  He will continue Ancef for a total of 4 weeks.  He is     currently stable from this standpoint. 2. Diabetes type 2, currently well controlled.  No changes were made.     His insulin was titrated up.  He will probably need oral     hypoglycemic agents as an outpatient, but at this time, we will     keep him on insulin as he had several procedures to try to get to     the bottom of his condition. 3. Rectal perforation, status post CT scan of the abdomen and pelvis     and sigmoidoscopy by Dr. Lina [name], that showed results as above,     that showed rectal fistula, otherwise normal examination, draining     fistulous tract at the level of the ampulla.  No signs of bleeding.     By surgery recommendations, Flagyl was started which he will     continue for a total of 7 days. 4. Hyponatremia that is probably  secondary to his cirrhosis.  His     Lasix was titrated up as spironolactone was added once we find that     he had cirrhosis and this corrected. 5. Severe protein malnutrition secondary to his alcohol and cirrhosis     most likely appears currently stable. 6. Peripheral neuropathy, currently stable with pain control.     Neurology was consulted.  Recommended to continue him on     gabapentin. 7. Hypokalemia that is probably secondary to Lasix.  We are repleting     this.  We will also check a mag and replete it as needed. 8. Cirrhosis secondary to alcohol abuse.  The patient was questioned     extensively about this in the beginning.  He related he does not     drink.  With ongoing discussion with his sister, she relates he     drinks about 12 beers a day.  Scan was done that shows cirrhosis,     so he was started on spironolactone and Lasix which we have     titrated up and we will continue to manage as an outpatient. 9. Blood loss anemia, probably secondary to rectal perforation.  He     had a venous bleeding on admission and this was thought to be     hemorrhoids, but once he was put on Lovenox, he kept on going     bleeding.  On the time of the dictation, the hemoglobin was 7.1.     He was typed and crossed and transfused and the CBC is pending. 10.Pyelonephritis.  His urine culture came back MSSA bacteremia.  This     should be covered by Ancef.     [name], M.D.     AF/MEDQ  D:  10/04/2011  T:  10/04/2011  Job:  [id number]  cc:   Ralph Massed, MD Fax: [phone]  Electronically Signed by [name] M.D. on 10/14/2011 05:34:11 PM

## 2011-10-16 NOTE — H&P (Signed)
NAMEELVERT, [name] NO.:  [id number]  MEDICAL RECORD NO.:  [id number]  LOCATION:  WLED                         FACILITY:  [facility]  PHYSICIAN:  Jonny Ruiz, MD    DATE OF BIRTH:  1961/10/12  DATE OF ADMISSION:  09/22/2011 DATE OF DISCHARGE:                             HISTORY & PHYSICAL   CHIEF COMPLAINT:  Fever.  HISTORY OF PRESENT ILLNESS:  The patient is a 50 year old male with a past medical history significant for diabetes and most recently diagnosed with a small-vessel vasculitic polyneuropathy, which has developed since the August 20, 2011, characterized by paresthesias and weakness of the lower extremities as well as urinary retention for which the patient has an indwelling catheter, presenting to the office of his urologist this morning for an evaluation and was incidentally found with a temperature of 102.  Subsequently, the patient was sent to [facility] Emergency Department for an evaluation.  The patient tells me that he has been feeling sick this week.  Since Monday, he has been with a low appetite, nauseous, and has vomited a couple of times.  He has been having a fever off and on and severe night sweats.  The patient has been having fecal incontinence as a result of his neurologic process since last month.  The patient denies low back pain.  The patient's sister is sitting at the bedside and tells me that he has had a million dollar workup in the last 6 weeks and has been seen by multiple physicians including his primary care physician, Dr. [name], his neurologist Dr. Modesto Charon and his urologist.  He has also been seen at [facility] and has undergone testing for HIV, hepatitis B, hepatitis C, and hemochromatosis all of which has been reportedly negative.  He has also undergone an MRI of the lumbar spine and a lumbar puncture all of which has been on revealing.  He was scheduled to undergo nerve conduction studies next week.  The  patient was found in the emergency department with a white count of 17,000, a sodium of 116, and UA compatible with a UTI.  The patient is being admitted for further evaluation and management.  Dr. Modesto Charon from Neurology has been contacted by Dr. Denton Lank ED physician and was agreeable to consult while the patient is in the hospital.  PAST MEDICAL HISTORY: 1. Diabetes mellitus type 2, on Victoza and ACTOplus met. 2. Hypertension.  The patient is not sure but he was placed on     lisinopril 2.5 mg a day. 3. Knee arthritis and underwent knee surgery.  MEDICATIONS: 1. Victoza 18 mcg b.i.d. 2. ACTOplus met 15/1000 b.i.d. 3. Lisinopril 2.5 mg a day. 4. Aspirin 81 mg a day. 5. Multivitamins one a day.  ALLERGIES:  No known drug allergies.  PRIMARY CARE PHYSICIAN:  [name], MD  FAMILY HISTORY:  His father died at the age of 39 from a myocardial infarction.  He has 1 sister with hemochromatosis.  SOCIAL HISTORY:  The patient is single and has never been married.  He has no children.  He lives with his sister who is also single and a handicapped brother.  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  The patient chews tobacco.  He drinks beer about two beers per day and his sister says that he never drinks more than 16 beers per week.  Denies illicits.  REVIEW OF SYSTEMS:  CONSTITUTIONAL:  Positive for fever, chills, and night sweats as well as malaise.  No weight loss.  HEENT:  The patient denies runny nose, epistaxis, sore throat, earache, hearing loss, or vertigo.  CARDIOVASCULAR: The patient denies chest pain, shortness of breath, or palpitations.  He denies orthopnea, nocturia, PND, or edema. Patient is positive for a murmur.  The patient was told he had a murmur and underwent an echocardiogram this year that confirmed it. RESPIRATORY:  Denies cough or wheezing.  Denies history of tuberculosis. Denies history of asthma.  GASTROINTESTINAL: Positive  for nausea and vomiting as well as fecal incontinence.  No hematochezia or melena.  No hematemesis.  No mucus.  No jaundice.  GU: The patient has an indwelling catheter.  Denies hematuria.  Denies history of renal disorders. MUSCULOSKELETAL: The patient complains of generalized arthralgias, low back pain, and knee pain.  No joint swelling.  No morning stiffness. NEUROLOGICAL: As in HPI, the patient has been having progressive weakness of the lower extremities, the left side greater than the right as well as paresthesias in the hands.  He tells me that he cannot feel his buttons when he is buttoning his shirt or putting on his pants.  The patient has also developed urinary retention and fecal incontinence and as I said earlier has been diagnosed with small-vessel vasculitic polyneuropathy.  PHYSICAL EXAMINATION:  VITAL SIGNS: The blood pressure is 123/59, pulse 104, respiration 22, temperature 99.9, O2 sat 100%. GENERAL APPEARANCE:  The patient is a middle-aged man who appears comfortable in no distress.  He is pleasant and cooperative. HEENT: Unremarkable. NECK:  Supple.  No lymphadenopathy.  No JVD. HEART:  Regular S1-S2 with a grade 2/6 systolic ejection murmur over the aorta. LUNGS:  Clear to auscultation. ABDOMEN: Soft and nontender without organomegaly or masses palpable. EXTREMITIES: Without clubbing, cyanosis, or edema.  There is muscle atrophy of the hands bilaterally as well as some muscle weakness of the lower extremities. NEUROLOGICAL: The patient is alert and oriented x3.  Cranial nerves II- XII intact.  Motor strength. The patient has 3/5 strength in the lower extremities, more so on the left, more weakness on the left.  There is also numbness and paresthesias in the hands.  LABORATORY ASSESSMENT:  WBC 16.9, hemoglobin 12.3, hematocrit 35.1, MCV 84.8, platelet count 133.  UA shows large leukocyte esterase with many bacteria and WBCs too numerous to count.  Urine glucose  greater than 1000.  Urine hemoglobin large.  Sodium 116, potassium 4.6, chloride 80, carbon dioxide 22, glucose 448, BUN 47, creatinine 0.78, calcium 9.3, total protein 6.9, albumin 2.3, AST 18, ALT 21, alk phos 144, total bilirubin 0.9.  Chest x-ray slightly low lung volumes with no worrisome focal or acute abnormality.  ASSESSMENT AND PLAN: 1. Urinary tract infection secondary to indwelling catheter in the     setting of uncontrolled diabetes mellitus. 2. Hyponatremia, most likely hypertonic hyponatremia due to excess     glucose in the serum.  At any rate, we will obtain a serum     osmolality, urine osmolality, and urine sodium to further     characterize his hyponatremia. 3. Uncontrolled diabetes. 4. Leukocytosis most likely result of his urinary tract infection. 5. Small-vessel vasculitic polyneuropathy.  PLAN: 1. Admit the patient to the hospital.  Workup as described above with     urine and serum osmolality as well as urine sodium, TSH, urine     culture and sensitivity, blood cultures x2. 2. Start Cipro 400 mg IV q. 12. 3. Administer insulin Lantus 10 units as a basal insulin and start     insulin sliding scale per protocol. 4. Obtain A1c. 5. Consult Dr. Modesto Charon from Neurology. 6. Miscellaneous discussed with the patient and living will.  He does     not have one in place.  Discussed code status.  The patient is a     full code for the time being.          ______________________________ Jonny Ruiz, MD     GL/MEDQ  D:  09/22/2011  T:  09/22/2011  Job:  [id number]  cc:   [name], MD Fax: [phone]  Electronically Signed by Jonny Ruiz MD on 10/16/2011 09:50:12 PM

## 2011-10-18 ENCOUNTER — Encounter: Payer: Self-pay | Admitting: Family Medicine

## 2011-10-18 ENCOUNTER — Ambulatory Visit (INDEPENDENT_AMBULATORY_CARE_PROVIDER_SITE_OTHER): Payer: Managed Care, Other (non HMO) | Admitting: Family Medicine

## 2011-10-18 ENCOUNTER — Other Ambulatory Visit: Payer: Self-pay | Admitting: *Deleted

## 2011-10-18 VITALS — BP 137/74 | HR 78 | Temp 97.7°F | Wt 184.0 lb

## 2011-10-18 DIAGNOSIS — N12 Tubulo-interstitial nephritis, not specified as acute or chronic: Secondary | ICD-10-CM

## 2011-10-18 DIAGNOSIS — G609 Hereditary and idiopathic neuropathy, unspecified: Secondary | ICD-10-CM

## 2011-10-18 DIAGNOSIS — K766 Portal hypertension: Secondary | ICD-10-CM

## 2011-10-18 DIAGNOSIS — K631 Perforation of intestine (nontraumatic): Secondary | ICD-10-CM

## 2011-10-18 DIAGNOSIS — E876 Hypokalemia: Secondary | ICD-10-CM

## 2011-10-18 DIAGNOSIS — R159 Full incontinence of feces: Secondary | ICD-10-CM

## 2011-10-18 DIAGNOSIS — G629 Polyneuropathy, unspecified: Secondary | ICD-10-CM

## 2011-10-18 DIAGNOSIS — R197 Diarrhea, unspecified: Secondary | ICD-10-CM

## 2011-10-18 MED ORDER — FUROSEMIDE 40 MG PO TABS: ORAL_TABLET | ORAL | Status: DC

## 2011-10-18 MED ORDER — POTASSIUM CHLORIDE CRYS ER 20 MEQ PO TBCR: EXTENDED_RELEASE_TABLET | ORAL | Status: DC

## 2011-10-18 NOTE — Progress Notes (Signed)
OFFICE VISIT  10/19/2011   CC:  Chief Complaint  Patient presents with  . Follow-up     HPI:     Patient is [name] who presents for f/u ascites, edema, MSSA bacteremia/pyelo, peripheral neuropathy of unclear etiology. No change in diffuse lower extremity weakness.  Daytime BMs have essentially normalized but he continues to have some nocturnal urgent bms and leakage of stool.  No fever, no rectal pain, no blood or mucous noted in stool.  The stool is mushy but not really formed (but not all water). Says this occurs avg 5 times from 2 AM to 4AM every night.  No abd pain.  No n/v.   Reviewed labs done by [facility] nursing yesterday: WBC normal, Hb and platelets holding steady at 9.0 and 126K respectively. BMET remarkable only for glucose of 159 and potassium of 2.9.    Past Medical History  Diagnosis Date  . Low back pain 04/2011    Plain film 04/2011: spondylosis/old osteophyte vs endplate fx L3  . Abnormal EKG   . Heart murmur     Diastolic (06/30/11): ECHO 07/2011 showed mild AS and LVH (but no problem to correlate with diastolic murmur).  TEE 09/2011 showed small PFO, bicuspid aortic valve but no stenosis or regurg, small pericardial effusion.  . Hypertension 06/2011  . Hematuria 07/27/2011, 08/25/11  . UTI (lower urinary tract infection) 07/2011    MSSA  . Diabetes mellitus   . 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 Perforation of large intestine 09/2011    unknown etiology, fistula noted on CT, no abscess (10/05/11).  Gen surg and GI recommended flagyl and cipro x 2 wks.  . Cholelithiasis 09/2011  . Pyelonephritis 09/2011    MSSA (+bacteremia); iv ancef via HH x 4 wks  . Alcoholism   . 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 Knee arthroscopy     Bilateral    Outpatient Prescriptions Prior to Visit  Medication Sig Dispense Refill  . acetaminophen (TYLENOL) 500 MG tablet Take  500 mg by mouth every 8 (eight) hours as needed.        [name] CeFAZolin Sodium (ANCEF IV) Inject 2 g into the vein every 8 (eight) hours.        Jae Dire Bandages & Supports (MEDICAL COMPRESSION STOCKINGS) MISC 1 Units by Does not apply route daily as needed. Apply to lower extremities daily as needed for lower extremity swelling  1 each  3  . ferrous sulfate 325 (65 FE) MG tablet Take 1 tablet (325 mg total) by mouth 2 (two) times daily.  30 tablet  11  . glucose blood (FREESTYLE LITE) test strip PRN  32 each  12  . insulin aspart (NOVOLOG) 100 UNIT/ML injection Inject into the skin 3 (three) times daily before meals.        . insulin glargine (LANTUS SOLOSTAR) 100 UNIT/ML injection Inject 45 Units into the skin at bedtime.        . Insulin Pen Needle 32G X 6 MM MISC Use with insulin pen for 4 injections daily  120 each  10  . Lancets (FREESTYLE) lancets PRN  100 each  5  . lisinopril (PRINIVIL,ZESTRIL) 2.5 MG tablet Take 1 tablet (2.5 mg total) by mouth daily.  30 tablet  2  . Multiple Vitamins-Minerals (MULTI FOR HIM) PACK Take 1 each by mouth daily.        [name]  spironolactone (ALDACTONE) 50 MG tablet Take 1 tablet (50 mg total) by mouth daily.  30 tablet  1  . furosemide (LASIX) 40 MG tablet Take 1 tablet (40 mg total) by mouth daily.  30 tablet  1    Allergies  Allergen Reactions  . Sulfa Antibiotics   . Naproxen Nausea Only    ROS As per HPI  PE: Blood pressure 137/74, pulse 78, temperature 97.7 F (36.5 C), temperature source Oral, weight 184 lb (83.462 kg), SpO2 100.00%. Gen: Alert, well appearing.  Patient is oriented to person, place, time, and situation. CV: RRR LUNGS: CTA bilat, nonlabored. ABD: soft, minimal distention.  No tenderness.  BS slightly hyperactive.  No HSM or mass or bruit. EXT: 1-2 + pitting edema in ankles.  No ulcers or erythema on LEs.  LABS:  None today (see HPI for recent ones)  IMPRESSION AND PLAN:  Portal hypertension With ascites and LE edema--much  improved with diuresis. Continue aldactone at current dose, decrease lasix to 40mg  qod instead of daily. Arranging compression stockings.    Peripheral neuropathy Urinary function normal now, but rectal tone still extremely poor and he continues to leak stool. W/u to continue at [facility] 11/17/11 with EMGs and NCS. 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  Pyelonephritis With MSSA bacteremia.  Improving. Finish 4 wk course of ancef 2 g IV q8h--last dose to be Nov 8.  Type II or unspecified type diabetes mellitus without mention of complication, uncontrolled Mild improvement. Discussed need for mealtime novolog--base of 5 units qAC and add on appropriate number of units based on sliding scale the hospital gave him at d/c. Continue to titrate lantus hs to get fasting glucose consistently in range of 100-110.  Hypokalemia Secondary to diuresis and a bit of excess stooling. Replace and start daily supplement--see orders. Repeat BMET ordered for 10/24/11.  Perforation of colon Rectal perf--resolving clinically (no further anal bleeding and no further pain with defecation. He has finished his cipro and flagyl. GI f/u has been arranged for late November.  Fecal incontinence Vs actual diarrhea. With all the abx he's had lately I will go ahead and check c diff pcr again (this was negative a couple of weeks ago).     FOLLOW UP: Return for already has f/u appt set up in november.

## 2011-10-19 DIAGNOSIS — K631 Perforation of intestine (nontraumatic): Secondary | ICD-10-CM | POA: Insufficient documentation

## 2011-10-19 DIAGNOSIS — E876 Hypokalemia: Secondary | ICD-10-CM | POA: Insufficient documentation

## 2011-10-19 DIAGNOSIS — R159 Full incontinence of feces: Secondary | ICD-10-CM | POA: Insufficient documentation

## 2011-10-19 NOTE — Assessment & Plan Note (Signed)
Rectal perf--resolving clinically (no further anal bleeding and no further pain with defecation. He has finished his cipro and flagyl. GI f/u has been arranged for late November.

## 2011-10-19 NOTE — Assessment & Plan Note (Signed)
Urinary function normal now, but rectal tone still extremely poor and he continues to leak stool. W/u to continue at [facility] 11/17/11 with EMGs and NCS. 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

## 2011-10-19 NOTE — Assessment & Plan Note (Signed)
Vs actual diarrhea. With all the abx he's had lately I will go ahead and check c diff pcr again (this was negative a couple of weeks ago).

## 2011-10-19 NOTE — Assessment & Plan Note (Signed)
Secondary to diuresis and a bit of excess stooling. Replace and start daily supplement--see orders. Repeat BMET ordered for 10/24/11.

## 2011-10-19 NOTE — Assessment & Plan Note (Signed)
With ascites and LE edema--much improved with diuresis. Continue aldactone at current dose, decrease lasix to 40mg  qod instead of daily. Arranging compression stockings.

## 2011-10-19 NOTE — Assessment & Plan Note (Signed)
With MSSA bacteremia.  Improving. Finish 4 wk course of ancef 2 g IV q8h--last dose to be Nov 8.

## 2011-10-19 NOTE — Assessment & Plan Note (Signed)
Mild improvement. Discussed need for mealtime novolog--base of 5 units qAC and add on appropriate number of units based on sliding scale the hospital gave him at d/c. Continue to titrate lantus hs to get fasting glucose consistently in range of 100-110.

## 2011-10-25 ENCOUNTER — Encounter: Payer: Self-pay | Admitting: Neurology

## 2011-10-25 ENCOUNTER — Ambulatory Visit (INDEPENDENT_AMBULATORY_CARE_PROVIDER_SITE_OTHER): Payer: Managed Care, Other (non HMO) | Admitting: Neurology

## 2011-10-25 ENCOUNTER — Other Ambulatory Visit (INDEPENDENT_AMBULATORY_CARE_PROVIDER_SITE_OTHER): Payer: Managed Care, Other (non HMO)

## 2011-10-25 ENCOUNTER — Telehealth: Payer: Self-pay | Admitting: Family Medicine

## 2011-10-25 ENCOUNTER — Telehealth: Payer: Self-pay | Admitting: *Deleted

## 2011-10-25 VITALS — BP 112/60 | HR 84 | Wt 177.0 lb

## 2011-10-25 DIAGNOSIS — M6281 Muscle weakness (generalized): Secondary | ICD-10-CM

## 2011-10-25 NOTE — Telephone Encounter (Signed)
Verbal from Dr. [name]:  Decrease Lantus to 42 units at bedtime, increase Novolog to base 7 and sliding scale.  Advised PT instead of going to gym.  Pt is agreeable with that.  Pt has previously been to PT in [facility], but was not satisfied.  Advised we would look to see what services were available in [facility] and get back to him. Pt can recite plan and is agreeable.

## 2011-10-25 NOTE — Telephone Encounter (Signed)
Message copied by Richardson Chiquito on Tue Oct 25, 2011  8:27 AM ------      Message from: Hart Carwin      Created: Mon Oct 24, 2011  5:23 PM      Regarding: RE: flex. sigm       How about in January? If he is doing OK then it can wait.      ----- Message -----         From: Vernia Buff, CMA         Sent: 10/24/2011   3:39 PM           To: Hart Carwin, MD      Subject: FW: flex. sigm                                           I have attempted to schedule patient for flex. However, he states that he will be back to work probably by 12/04/11 and has already used all of his sick time and vacation time due to his hospitalization. He wonders if he would be able to have the procedure completed around the end of November instead of mid December. I have advised him that this may not allow for enough healing time of the fistula but that I would ask Dr Juanda Chance if this is a possibility.            ----- Message -----         From: Hart Carwin, MD         Sent: 10/24/2011   1:21 PM           To: Vernia Buff, CMA      Subject: flex. sigm                                               Just a flex sigm is OK.      ----- Message -----         From: Vernia Buff, CMA         Sent: 10/24/2011   8:26 AM           To: Hart Carwin, MD            Dr Juanda ChanceGunnar Fusi told me that patient needed a repeat flex around 12-04-11. However, I see that patient has been set up for an office appointment for follow up too.Marland KitchenMarland KitchenDoes he need just recall flex or does he need office visit before we schedule anything?      ----- Message -----         From: Vernia Buff, CMA         Sent: 10/24/2011           To: Vernia Buff, CMA            Per Willette Cluster, patient needs follow up Flexible Sigmoidoscopy 8 weeks from previous flex that was done while in hospital (10/04/11). Call patient and set up previsit and flex (pt will need am since he is diabetic)

## 2011-10-25 NOTE — Telephone Encounter (Signed)
Patient would like to wait until January to have his procedure completed. We have no schedule available at this time, so I told patient that I will call him back in 2 weeks or so to set up his procedure once I have a schedule available. Patient is agreeable to this. He is also agreeable to cancelling his 11-14-11 appt for now.

## 2011-10-25 NOTE — Patient Instructions (Signed)
Go to the basement to have your labs drawn today.  We will call you in about 2 months for a 3 month follow up.

## 2011-10-25 NOTE — Progress Notes (Signed)
Dear Dr. [name],  I saw Mr. 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  As you may recall he is a 50 year old man with a history of poorly controlled diabetes mellitus who originally presented to you with urinary retention and diarrhea.  An MRI of his L spine had been unremarkable but I was worried about a possible demyelinating neuropathy and performed an LP to look for cytoalbuminologic disassociation.  His CSF was unremarkable with normal protein as well as no abnormal cells.  Incidentally, ESR was elevated in the 60s and CRP was elevated in the 2s.  Shortly after that he was hospitalized for a urinary tract infection and staph aureus in his blood.  He was diagnosed with cirrhosis and found to have a rectal tear.  They removed his urinary catheter while he was in hospital with no problems with urinary retention.  He was sent home on IV abx.  I felt that his urinary retention was likely non-neurologic as it has improved and was likely due to the local inflammation from his rectal tear.  Because of this diagnosis of autonomic neuropathy(and a concern that perhaps he had an acute lumbosacral plexopathy from his diabetes) I did refer him to [facility].  He is scheduled to have an EMG/NCS at the end of the month.  Interestingly, he says that he continues to have weakness and sensory problems in his hands and feet and that this started around the same time as his bladder problem.  He says he only has mild pain on the bottom of his feet that is better with shoes.  His greatest complaint is numbness and weakness with walking.  The weakness is worse in his proximal legs.  Medical History:  recent diagnosis of cirrhosis, presumedly secondary to alcoholism, and rectal tear(is due for a follow up lower GI)  Meds:  Now include IV Ancef.  SocHx:  claims to have quit drinking EtOH since hospitalization.  ROS:  13 systems were reviewed and are notable for diffuse  weakness, difficulty walking.  No diarrhea, no bladder retention, but has lost weight.  All other review of systems are unremarkable.  Exam: . Filed Vitals:   10/25/11 0930  BP: 112/60  Pulse: 84  Weight: 177 lb (80.287 kg)    In general, well appearing man in NAD.  Mental status:   The patient is oriented to person, place and time. Recent and remote memory are intact. Attention span and concentration are normal. Language including repetition, naming, following commands are intact. Fund of knowledge of current and historical events, as well as vocabulary are normal.  Cranial Nerves: Pupils are equally round and reactive to light.Extraocular movements are intact without nystagmus. Facial sensation and muscles of mastication are intact. Muscles of facial expression are symmetric and strong.Tongue protrusion, uvula, palate midline.  Shoulder shrug intact.  Motor:  Decreased bulk in his arms and legs.   SA EF EE FA HF KF KE FDF FPF Right 4+ 5 5 5 4 5 5  4+ 5 Left 4+ 5 5 5 4 5 5  4+ 5  Reflexes:  Absent throughout except 2+ at bilateral patella., toes down.  Sensation:  ++ impaired, length dependent, feet and hands to vibration, temp, as well as mildly position impaired.  Coordination:  Normal finger to nose  Gait:  Wide based gait.  Impression:  I think that Duy has a diabetic peripheral neuropathy likely with alcohol contributing, but I am slightly confounded by his proximal muscle  weakness.  This could be deconditioning however.  It is strange how his diabetic neuropathy became worse(or at least noticeable) when he had the bladder retention/rectal tear and then infection, but much of this could be deconditioning.  I am going to recheck an ESR and CRP today.  I  will feel better if this has decreased(likely was increased due to illness when I originally measured it).  I don't think his symptoms are severe enough to warrant treatment with gabapentin.  I am also not as worried that his  urinary retention was neuropathic.  Also he is going to see the [facility] folks for a EMG/NCS which will likely confirm a mainly axonal process.  I recommended that he talk to you about whether he needs to see a diabetes specialist or at least get some diabetes education as this was offered in the hospital but he never received the referral.  However, I will of course leave that up to you.  We will see the patient back in 3 months.  Lupita Raider Modesto Charon, MD [facility], [place]

## 2011-10-25 NOTE — Telephone Encounter (Signed)
Pls notify pt that his potassium is back to normal.  I want him to continue taking the potassium pill I rx'd twice daily.   Also, remind him to continue to slowly increase his bedtime insulin (lantus) and mealtime insulin (novolog). His glucose on the labs this morning at 9AM was 322.  Thx--PM

## 2011-10-25 NOTE — Telephone Encounter (Signed)
RC to pt.  19 minutes spent discussing several issues with patient.  Patient states he is feeling better, his bowel movements have firmed up more, he is sleeping better, and urine looks better. 1--advised about lantus instructions.  Pt states his fasting sugars range from 70-100.  Highest sugar has been 163.  322 from labs yesterday was after eating.  i asked pt if he was using the sliding scale he was given in the hospital, pt states he is.  Advised pt i would ask about increasing Lantus and call him back.  2--advised to stop Ancef with last dose on Saturday.  Misty Stanley from [facility] will come out on Monday for labs and will remove pic line at that time.  He is agreeable. 3--pt wants to know if he can go to the gym to increase leg strength.  His employer is hoping he can return to work on 11/26 and at this time he would not be strong enough to complete his normal job duties.  Pt has appt on 11/19 to discuss returning to work. Please advise about Lantus and going to gym. Thanks

## 2011-10-26 ENCOUNTER — Other Ambulatory Visit: Payer: Self-pay | Admitting: Family Medicine

## 2011-10-26 DIAGNOSIS — G629 Polyneuropathy, unspecified: Secondary | ICD-10-CM

## 2011-10-26 DIAGNOSIS — R29898 Other symptoms and signs involving the musculoskeletal system: Secondary | ICD-10-CM

## 2011-10-26 NOTE — Telephone Encounter (Signed)
Noted. PT referral order placed [facility].

## 2011-10-28 ENCOUNTER — Ambulatory Visit: Payer: Managed Care, Other (non HMO) | Attending: Family Medicine | Admitting: Physical Therapy

## 2011-10-28 DIAGNOSIS — M545 Low back pain, unspecified: Secondary | ICD-10-CM | POA: Insufficient documentation

## 2011-10-28 DIAGNOSIS — M6281 Muscle weakness (generalized): Secondary | ICD-10-CM | POA: Insufficient documentation

## 2011-10-28 DIAGNOSIS — IMO0001 Reserved for inherently not codable concepts without codable children: Secondary | ICD-10-CM | POA: Insufficient documentation

## 2011-10-31 ENCOUNTER — Ambulatory Visit: Payer: Managed Care, Other (non HMO) | Admitting: Physical Therapy

## 2011-11-02 ENCOUNTER — Ambulatory Visit: Payer: Managed Care, Other (non HMO) | Admitting: Physical Therapy

## 2011-11-07 ENCOUNTER — Encounter: Payer: Self-pay | Admitting: Family Medicine

## 2011-11-07 ENCOUNTER — Telehealth: Payer: Self-pay | Admitting: *Deleted

## 2011-11-07 ENCOUNTER — Ambulatory Visit (INDEPENDENT_AMBULATORY_CARE_PROVIDER_SITE_OTHER): Payer: Managed Care, Other (non HMO) | Admitting: Family Medicine

## 2011-11-07 ENCOUNTER — Ambulatory Visit: Payer: Self-pay | Admitting: Family Medicine

## 2011-11-07 DIAGNOSIS — IMO0001 Reserved for inherently not codable concepts without codable children: Secondary | ICD-10-CM

## 2011-11-07 DIAGNOSIS — K631 Perforation of intestine (nontraumatic): Secondary | ICD-10-CM

## 2011-11-07 DIAGNOSIS — D500 Iron deficiency anemia secondary to blood loss (chronic): Secondary | ICD-10-CM

## 2011-11-07 DIAGNOSIS — N12 Tubulo-interstitial nephritis, not specified as acute or chronic: Secondary | ICD-10-CM

## 2011-11-07 DIAGNOSIS — E119 Type 2 diabetes mellitus without complications: Secondary | ICD-10-CM

## 2011-11-07 DIAGNOSIS — G629 Polyneuropathy, unspecified: Secondary | ICD-10-CM

## 2011-11-07 DIAGNOSIS — G609 Hereditary and idiopathic neuropathy, unspecified: Secondary | ICD-10-CM

## 2011-11-07 DIAGNOSIS — R5381 Other malaise: Secondary | ICD-10-CM

## 2011-11-07 DIAGNOSIS — R159 Full incontinence of feces: Secondary | ICD-10-CM

## 2011-11-07 DIAGNOSIS — Z23 Encounter for immunization: Secondary | ICD-10-CM

## 2011-11-07 DIAGNOSIS — K7460 Unspecified cirrhosis of liver: Secondary | ICD-10-CM

## 2011-11-07 DIAGNOSIS — D696 Thrombocytopenia, unspecified: Secondary | ICD-10-CM

## 2011-11-07 LAB — CBC WITH DIFFERENTIAL/PLATELET
Basophils Absolute: 0 10*3/uL (ref 0.0–0.1)
Eosinophils Absolute: 0.1 10*3/uL (ref 0.0–0.7)
Lymphocytes Relative: 11.5 % — ABNORMAL LOW (ref 12.0–46.0)
MCHC: 33.8 g/dL (ref 30.0–36.0)
MCV: 92.1 fl (ref 78.0–100.0)
Monocytes Absolute: 0.4 10*3/uL (ref 0.1–1.0)
Neutrophils Relative %: 77.3 % — ABNORMAL HIGH (ref 43.0–77.0)
Platelets: 90 10*3/uL — ABNORMAL LOW (ref 150.0–400.0)
RBC: 3.44 Mil/uL — ABNORMAL LOW (ref 4.22–5.81)

## 2011-11-07 LAB — COMPREHENSIVE METABOLIC PANEL
ALT: 22 U/L (ref 0–53)
AST: 31 U/L (ref 0–37)
Albumin: 3.5 g/dL (ref 3.5–5.2)
Alkaline Phosphatase: 164 U/L — ABNORMAL HIGH (ref 39–117)
Calcium: 9.5 mg/dL (ref 8.4–10.5)
Chloride: 103 mEq/L (ref 96–112)
Potassium: 5.1 mEq/L (ref 3.5–5.1)
Sodium: 139 mEq/L (ref 135–145)
Total Protein: 7.2 g/dL (ref 6.0–8.3)

## 2011-11-07 LAB — MICROALBUMIN / CREATININE URINE RATIO: Creatinine,U: 14.7 mg/dL

## 2011-11-07 MED ORDER — INSULIN GLARGINE 100 UNIT/ML ~~LOC~~ SOLN: 42.0000 [IU] | Freq: Every day | SUBCUTANEOUS | Status: DC

## 2011-11-07 MED ORDER — GLUCAGON (RDNA) 1 MG IJ KIT: PACK | INTRAMUSCULAR | Status: DC

## 2011-11-07 MED ORDER — INSULIN ASPART 100 UNIT/ML ~~LOC~~ SOLN: SUBCUTANEOUS | Status: DC

## 2011-11-07 NOTE — Assessment & Plan Note (Signed)
Stable.  Pelvic autonomic issues seem largely resolved now. Still with easy fatiguablility of LE's. Due for WFUB testing next week (NCS and EMGs). Currently on no meds for this at this time. 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

## 2011-11-07 NOTE — Assessment & Plan Note (Signed)
With portal HTN and ascites/LE edema: MUCH improved.  Fluid balance looks good.  I think wt gain since last wt check 2 wks ago is partially due to calorie imbalance and not all fluid wt. Continue current meds/dosing.    Check lytes today.

## 2011-11-07 NOTE — Assessment & Plan Note (Signed)
Resolved

## 2011-11-07 NOTE — Assessment & Plan Note (Addendum)
Control slowly improving, his fasting gluc's are good, we are titrating his mealtime insulin slowly.   Will increase base novolog to 8 with BF, 10 with lunch and supper--plus continue SSI on top of this base. He admits to relatively poor compliance with diabetic diet.  Also, is not very active some days. Discussed how these things affect insulin needs, etc. Also discussed general plan in case of hypoglycemia, including prn use of glucagon (rx given) in emergency situation. Checking urine microalb/cr today (he has not had this per my records).

## 2011-11-07 NOTE — Progress Notes (Addendum)
OFFICE VISIT  11/16/2011   CC:  Chief Complaint  Patient presents with  . Follow-up    diabetes     HPI:    Patient is [name] who presents for 3 week follow up for cirrhosis/ascites/edema, DM 2, pyelo with MSSA bacteremia, and peripheral neuropathy.  Here with his sister today.  Overall continues to improve.  Appetite good, admits to cheating on diabetic diet.  Started PT but doing only twice weekly and remains pretty sedentary in the meantime--although his sister says she's getting him to go out on errands and do shopping lately which is improved. Legs feel weak/tired pretty easily, esp on incline or with climbing a flight of stairs.  Saw Dr. Modesto Charon (local neurology) pretty recently for f/u, no changes made. Continues to describe waking up in mornings feeling feet tingling and ankle pain, some low back pain, has to walk around "a while" in order to start loosening up and feeling better from a musculoskeletal standpoint.  Says left leg worse than right, describes general discomfort feeling in LE's, not exactly painful, not absolute weakness.  Just heaviness with ambulation.  Upper body "feels great".  He remains out of work, asks about when he can go back, apparently his employer needs him to come back by 11/21/11 or else he needs to pursue long term disability.  Has no problem with incontinence of urine or stool now, no more urinary retention problems either. No fever, no n/v.  Stools firming up "nearly normal" now, has 1-2 per day.  No blood or mucous in stool. Finished home abx regimen for pyelo/bacteremia.  Has been wearing compression stockings lately now, has been on current bp med/fluid pills regimen for the last few weeks.  Glucose numbers normal fasting, still approaching 200 postprandially (esp late afternoons). Dietary indescretion noted, relative inactivity noted.  Doing 7 U novolog as base at mealtime, plus SSI added on.  Past Medical History  Diagnosis Date   . Low back pain 04/2011    Plain film 04/2011: spondylosis/old osteophyte vs endplate fx L3  . Abnormal EKG   . Heart murmur     Diastolic (06/30/11): ECHO 07/2011 showed mild AS and LVH (but no problem to correlate with diastolic murmur).  TEE 09/2011 showed small PFO, bicuspid aortic valve but no stenosis or regurg, small pericardial effusion.  . Hypertension 06/2011  . Hematuria 07/27/2011, 08/25/11  . UTI (lower urinary tract infection) 07/2011    MSSA  . Diabetes mellitus   . 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 Perforation of large intestine 09/2011    unknown etiology, fistula noted on CT, no abscess (10/05/11).  Gen surg and GI recommended flagyl and cipro x 2 wks.  . Cholelithiasis 09/2011  . Pyelonephritis 09/2011    MSSA (+bacteremia); iv ancef via HH x 4 wks  . Alcoholism   . 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 Knee arthroscopy     Bilateral   Past surgical, family, and social history reviewed and there are no changes since their last office visit.   Outpatient Prescriptions Prior to Visit  Medication Sig Dispense Refill  . acetaminophen (TYLENOL) 500 MG tablet Take 500 mg by mouth every 8 (eight) hours as needed.        Jae Dire Bandages & Supports (MEDICAL COMPRESSION STOCKINGS) MISC 1 Units by Does not apply route daily as needed. Apply  to lower extremities daily as needed for lower extremity swelling  1 each  3  . furosemide (LASIX) 40 MG tablet 1 tab po qod  15 tablet  1  . glucose blood (FREESTYLE LITE) test strip PRN  32 each  12  . Insulin Pen Needle 32G X 6 MM MISC Use with insulin pen for 4 injections daily  120 each  10  . Lancets (FREESTYLE) lancets PRN  100 each  5  . lisinopril (PRINIVIL,ZESTRIL) 2.5 MG tablet Take 1 tablet (2.5 mg total) by mouth daily.  30 tablet  2  . Multiple Vitamins-Minerals (MULTI FOR HIM) PACK Take 1 each by mouth daily.        [name] spironolactone  (ALDACTONE) 50 MG tablet Take 1 tablet (50 mg total) by mouth daily.  30 tablet  1  . ferrous sulfate 325 (65 FE) MG tablet Take 1 tablet (325 mg total) by mouth 2 (two) times daily.  30 tablet  11  . insulin aspart (NOVOLOG) 100 UNIT/ML injection Inject into the skin 3 (three) times daily before meals.        . insulin glargine (LANTUS SOLOSTAR) 100 UNIT/ML injection Inject 42 Units into the skin at bedtime.       . potassium chloride SA (K-DUR,KLOR-CON) 20 MEQ tablet 2 tabs po bid x 2 days, then 1 tab po bid  60 tablet  0    Allergies  Allergen Reactions  . Sulfa Antibiotics   . Naproxen Nausea Only    ROS As per HPI  PE: Blood pressure 138/81, pulse 75, height 5\' 9"  (1.753 m), weight 184 lb (83.462 kg). Gen: Alert, well appearing.  Patient is oriented to person, place, time, and situation. CV: RRR, no m/r/g.   LUNGS: CTA bilat, nonlabored resps, good aeration in all lung fields. ABD: soft, nondistended, nontender.  +Mildly rotund but no sign of significant ascites. EXT: trace to 1+ pitting edema in both lower legs, with bronzing/freckling extensive.  No erythema or tenderness. NEURO: strength 5/5 prox and dist bilat in UE's and LE's.  Trace patellar and ankle reflexes bilat.  LABS:  None today.   Chemistry      Component Value Date/Time   NA 139 11/07/2011 1033   K 5.1 11/07/2011 1033   CL 103 11/07/2011 1033   CO2 29 11/07/2011 1033   BUN 15 11/07/2011 1033   CREATININE 0.7 11/07/2011 1033   CREATININE 0.58 06/30/2011 0932      Component Value Date/Time   CALCIUM 9.5 11/07/2011 1033   ALKPHOS 164* 11/07/2011 1033   AST 31 11/07/2011 1033   ALT 22 11/07/2011 1033   BILITOT 0.7 11/07/2011 1033     Lab Results  Component Value Date   WBC 4.1* 11/07/2011   HGB 10.7* 11/07/2011   HCT 31.7* 11/07/2011   MCV 92.1 11/07/2011   PLT 90.0 Repeated and verified X2.* 11/07/2011   Lab Results  Component Value Date   HGBA1C 10.9* 09/23/2011   Lab Results  Component Value  Date   IRON 24* 10/02/2011   TIBC 138* 10/02/2011   FERRITIN 1186* 10/02/2011     IMPRESSION AND PLAN:  Type II or unspecified type diabetes mellitus without mention of complication, uncontrolled Control slowly improving, his fasting gluc's are good, we are titrating his mealtime insulin slowly.   Will increase base novolog to 8 with BF, 10 with lunch and supper--plus continue SSI on top of this base. He admits to relatively poor compliance with diabetic diet.  Also, is not very active some days. Discussed how these things affect insulin needs, etc. Also discussed general plan in case of hypoglycemia, including prn use of glucagon (rx given) in emergency situation. Checking urine microalb/cr today (he has not had this per my records).  Peripheral neuropathy Stable.  Pelvic autonomic issues seem largely resolved now. Still with easy fatiguablility of LE's. Due for [facility] testing next week (NCS and EMGs). Currently on no meds for this at this time. 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  Blood loss anemia He is on iron for the last few weeks. Following Hb/Hct. Check CBC today.  Perforation of colon Resolved.  Has no anorectal symptoms, no blood in stool. His f/u with Dr. Juanda Chance in GI got cancelled and next opening is not until 2013 so we'll hold off on any f/u at this time.  Fecal incontinence Resolved.  Pyelonephritis With MSSA bacteremia.  He is finished with 43mo course of home abx and feels well. Urinary function normal, asymptomatic.  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  Fluid balance looks good.  I think wt gain since last wt check 2 wks ago is partially due to calorie imbalance and not all fluid wt. Continue current meds/dosing.    Check lytes today.  Debilitated patient Since September this year he has had a multitude of medical problems that have all contributed to his state of debilitation currently, even though many of his initial  problems have improved or resolved (fecal incontinence and urinary retention, pyelonephritis with MSSA bacteremia).  Comorbid dx that we are getting stabilized are DM and cirrhosis with portal HTN/ascites.  At this time it I believe he is unable to do any physical labor such as lifting more than 5 lbs, pushing objects, climbing stairs repetitively, or standing for any period longer than 20 minutes without resting.  I recommended he ask his employer if they offer any light duty options (desk work, answering phone, paperwork, filing, etc), otherwise we'll have to do paperwork for long term disability, b/c I don't expect him to be able to do his previous physical work for at least another month, possibly 2 months. He'll continue PT, specialist f/u, meds, and we'll continue to monitor his recovery here periodically as well.   HTN: stable.  Continue current meds.  FOLLOW UP: Return in about 1 month (around 12/07/2011) for f/u neuropathy, cirrhosis/ascites, DM 2.

## 2011-11-07 NOTE — Telephone Encounter (Signed)
Pt calls stating he picked up med from pharmacy and label says it is Novolog however, pt states Victoza is in package.  Advised pt to return med to pharmacy and notify them they have given him the wrong med.  Advised we will call in refill for lantus. Spoke with pharmacist, Gunnar Fusi, at [facility] and gave verbal order to change Novolog to Flexpen instead of vial.  Also, refill Lantus, 42 units, dispense one box with 5 refills. Pt states pharmacy corrected the above error.

## 2011-11-07 NOTE — Assessment & Plan Note (Signed)
He is on iron for the last few weeks. Following Hb/Hct. Check CBC today.

## 2011-11-07 NOTE — Assessment & Plan Note (Signed)
With MSSA bacteremia.  He is finished with 63mo course of home abx and feels well. Urinary function normal, asymptomatic.

## 2011-11-07 NOTE — Assessment & Plan Note (Signed)
Resolved.  Has no anorectal symptoms, no blood in stool. His f/u with Dr. Juanda Chance in GI got cancelled and next opening is not until 2013 so we'll hold off on any f/u at this time.

## 2011-11-08 ENCOUNTER — Telehealth: Payer: Self-pay | Admitting: *Deleted

## 2011-11-08 ENCOUNTER — Ambulatory Visit: Payer: Managed Care, Other (non HMO) | Admitting: Physical Therapy

## 2011-11-08 NOTE — Progress Notes (Signed)
Addended by: Baldemar Lenis R on: 11/08/2011 02:37 PM   Modules accepted: Orders

## 2011-11-08 NOTE — Telephone Encounter (Signed)
Notes Recorded by Francee Piccolo, CMA on 11/08/2011 at 4:54 PM Pt notified of results. Pt voices understanding and is agreeable with plan. Pt states he used to drink 2 beers a day and he would increase on the weekend, but would not drink a 6-pack.  Pt has been doing this since 63 (50 yo).  Pt did make the comment, "but who counts at parties?" Pt denies any binge drinking, no blackouts and no history of previous detox. Pt is able to recite instructions for potassium and ferrous sulfate.   Med list updated to show changes below.

## 2011-11-08 NOTE — Telephone Encounter (Signed)
Message copied by Luisa Dago on Tue Nov 08, 2011  4:54 PM ------      Message from: [place], [place] H      Created: Tue Nov 08, 2011  1:22 PM       Labs yesterday pretty stable except potassium borderline high.  I want him to decrease his potassium to 1 of the 20 mEq tabs ONCE DAILY instead of twice daily.      Platelets stable compared to [facility] check 1 wk ago.  Hb/Hct improved significantly.  Continue iron but he can cut back to ONE Ferrous sulfate 325mg  tab ONCE daily instead of bid.      I'm trying to add on a few labs in regard to his cirrhosis.  Please see if you can get him to clearly outline his alcohol intake pattern over the years (typically how many beers/whisky drinks) per day or per week and for how many years.      I'm trying to make sure that blaming his cirrhosis on alcohol damage is reasonable.  Thx--PM

## 2011-11-08 NOTE — Progress Notes (Signed)
Noted-PM 

## 2011-11-09 ENCOUNTER — Ambulatory Visit: Payer: Managed Care, Other (non HMO) | Admitting: Physical Therapy

## 2011-11-09 LAB — ANA: Anti Nuclear Antibody(ANA): NEGATIVE

## 2011-11-11 LAB — ANTI-SMOOTH MUSCLE ANTIBODY, IGG: Smooth Muscle Ab: 11 U (ref <20)

## 2011-11-14 ENCOUNTER — Encounter: Payer: Self-pay | Admitting: Internal Medicine

## 2011-11-14 ENCOUNTER — Ambulatory Visit: Payer: Managed Care, Other (non HMO) | Admitting: Internal Medicine

## 2011-11-15 ENCOUNTER — Ambulatory Visit: Payer: Managed Care, Other (non HMO) | Admitting: Physical Therapy

## 2011-11-16 ENCOUNTER — Ambulatory Visit: Payer: Managed Care, Other (non HMO) | Admitting: Physical Therapy

## 2011-11-16 ENCOUNTER — Encounter: Payer: Self-pay | Admitting: Family Medicine

## 2011-11-16 DIAGNOSIS — R5381 Other malaise: Secondary | ICD-10-CM | POA: Insufficient documentation

## 2011-11-16 NOTE — Assessment & Plan Note (Signed)
Since September this year he has had a multitude of medical problems that have all contributed to his state of debilitation currently, even though many of his initial problems have improved or resolved (fecal incontinence and urinary retention, pyelonephritis with MSSA bacteremia).  Comorbid dx that we are getting stabilized are DM and cirrhosis with portal HTN/ascites.  At this time it I believe he is unable to do any physical labor such as lifting more than 5 lbs, pushing objects, climbing stairs repetitively, or standing for any period longer than 20 minutes without resting.  I recommended he ask his employer if they offer any light duty options (desk work, answering phone, paperwork, filing, etc), otherwise we'll have to do paperwork for long term disability, b/c I don't expect him to be able to do his previous physical work for at least another month, possibly 2 months. He'll continue PT, specialist f/u, meds, and we'll continue to monitor his recovery here periodically as well.

## 2011-11-17 ENCOUNTER — Encounter: Payer: Self-pay | Admitting: Physical Therapy

## 2011-11-21 ENCOUNTER — Ambulatory Visit (AMBULATORY_SURGERY_CENTER): Payer: Managed Care, Other (non HMO) | Admitting: *Deleted

## 2011-11-21 VITALS — Ht 70.0 in | Wt 186.0 lb

## 2011-11-21 DIAGNOSIS — K631 Perforation of intestine (nontraumatic): Secondary | ICD-10-CM

## 2011-11-22 ENCOUNTER — Ambulatory Visit: Payer: Managed Care, Other (non HMO) | Attending: Family Medicine | Admitting: Physical Therapy

## 2011-11-22 DIAGNOSIS — IMO0001 Reserved for inherently not codable concepts without codable children: Secondary | ICD-10-CM | POA: Insufficient documentation

## 2011-11-22 DIAGNOSIS — M545 Low back pain, unspecified: Secondary | ICD-10-CM | POA: Insufficient documentation

## 2011-11-22 DIAGNOSIS — M6281 Muscle weakness (generalized): Secondary | ICD-10-CM | POA: Insufficient documentation

## 2011-11-24 ENCOUNTER — Ambulatory Visit: Payer: Managed Care, Other (non HMO) | Admitting: Physical Therapy

## 2011-11-25 ENCOUNTER — Telehealth: Payer: Self-pay | Admitting: Family Medicine

## 2011-11-25 NOTE — Telephone Encounter (Signed)
Please contact insurance company about patient's plan of care and available resources. She is in the office 7:30-4:30-okay to call back on Monday 11/28/11.

## 2011-11-28 ENCOUNTER — Other Ambulatory Visit: Payer: Self-pay

## 2011-11-28 ENCOUNTER — Telehealth: Payer: Self-pay | Admitting: Family Medicine

## 2011-11-28 MED ORDER — POTASSIUM CHLORIDE CRYS ER 20 MEQ PO TBCR: 20.0000 meq | EXTENDED_RELEASE_TABLET | Freq: Every day | ORAL | Status: DC

## 2011-11-28 NOTE — Telephone Encounter (Signed)
Please advise 

## 2011-11-29 ENCOUNTER — Ambulatory Visit: Payer: Managed Care, Other (non HMO) | Admitting: Physical Therapy

## 2011-11-29 NOTE — Telephone Encounter (Signed)
Pt called this AM questioning if we have seen results from his last [facility] appt.  Advised I do not see anything scanned into the system, but I do not know if a report is waiting on his desk.  We will check. Also wanted to know if he faxed in LTD paperwork.  Advised we have faxed it back, but will refax. Also, Sherise from [facility] called to increase # of test strips.  Verbal order given to use TID.  #[phone].

## 2011-11-29 NOTE — Telephone Encounter (Signed)
Pt informed

## 2011-11-29 NOTE — Telephone Encounter (Signed)
I've not received any results.  Tell him to contact that physician's office to get results from tests that they did.--PM

## 2011-11-30 ENCOUNTER — Ambulatory Visit: Payer: Managed Care, Other (non HMO) | Admitting: Physical Therapy

## 2011-12-01 ENCOUNTER — Encounter: Payer: Self-pay | Admitting: Internal Medicine

## 2011-12-01 ENCOUNTER — Ambulatory Visit (AMBULATORY_SURGERY_CENTER): Payer: Managed Care, Other (non HMO) | Admitting: Internal Medicine

## 2011-12-01 VITALS — BP 126/79 | HR 75 | Temp 97.7°F | Resp 20 | Ht 70.0 in | Wt 186.0 lb

## 2011-12-01 DIAGNOSIS — K631 Perforation of intestine (nontraumatic): Secondary | ICD-10-CM

## 2011-12-01 LAB — GLUCOSE, CAPILLARY
Glucose-Capillary: 117 mg/dL — ABNORMAL HIGH (ref 70–99)
Glucose-Capillary: 117 mg/dL — ABNORMAL HIGH (ref 70–99)

## 2011-12-01 MED ORDER — SODIUM CHLORIDE 0.9 % IV SOLN: 500.0000 mL | INTRAVENOUS | Status: DC

## 2011-12-01 NOTE — Op Note (Signed)
[facility] [address] [place], [place]  [address]  FLEXIBLE SIGMOIDOSCOPY PROCEDURE REPORT  PATIENT:  [name], [name]  MR#:  [id number] BIRTHDATE:  02/11/1961, 50 yrs. old  GENDER:  male  ENDOSCOPIST:  Hedwig Morton. Juanda Chance, MD Referred by:  Earley Favor, M.D.  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  Digital rectal exam was performed and revealed decreased sphincter tone.   The [id number] endoscope was introduced through the anus and advanced to the descending colon, limited by poor preparation.   The quality of the prep was poor.  The instrument was then slowly withdrawn as the mucosa was fully examined. <<PROCEDUREIMAGES>>  A healed  fistula was found in the rectum (see image2, image3, image1, and image4). healed fistula in the rectum   Retroflexed views in the rectum revealed no abnormalities.    The scope was then withdrawn from the patient and the procedure terminated. 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  ______________________________ Hedwig Morton. Juanda Chance, MD  CC:  n. eSIGNED:   Hedwig Morton. Amillya Chavira at 12/01/2011 12:08 PM  Evaristo Bury, [phone]

## 2011-12-01 NOTE — Progress Notes (Signed)
Patient did not experience any of the following events: a burn prior to discharge; a fall within the facility; wrong site/side/patient/procedure/implant event; or a hospital transfer or hospital admission upon discharge from the facility. (G8907) Patient did not have preoperative order for IV antibiotic SSI prophylaxis. (G8918)  

## 2011-12-01 NOTE — Patient Instructions (Signed)
Discharge instructions given with verbal understanding. Continue high fiber diet. Resume previous medications.

## 2011-12-02 ENCOUNTER — Telehealth: Payer: Self-pay | Admitting: *Deleted

## 2011-12-02 NOTE — Telephone Encounter (Signed)

## 2011-12-05 ENCOUNTER — Encounter: Payer: Self-pay | Admitting: Family Medicine

## 2011-12-07 ENCOUNTER — Ambulatory Visit: Payer: Managed Care, Other (non HMO) | Admitting: Physical Therapy

## 2011-12-07 ENCOUNTER — Encounter: Payer: Self-pay | Admitting: Family Medicine

## 2011-12-07 ENCOUNTER — Telehealth: Payer: Self-pay | Admitting: Neurology

## 2011-12-07 ENCOUNTER — Ambulatory Visit (INDEPENDENT_AMBULATORY_CARE_PROVIDER_SITE_OTHER): Payer: Managed Care, Other (non HMO) | Admitting: Family Medicine

## 2011-12-07 DIAGNOSIS — G629 Polyneuropathy, unspecified: Secondary | ICD-10-CM

## 2011-12-07 DIAGNOSIS — G609 Hereditary and idiopathic neuropathy, unspecified: Secondary | ICD-10-CM

## 2011-12-07 DIAGNOSIS — K631 Perforation of intestine (nontraumatic): Secondary | ICD-10-CM

## 2011-12-07 DIAGNOSIS — D500 Iron deficiency anemia secondary to blood loss (chronic): Secondary | ICD-10-CM

## 2011-12-07 DIAGNOSIS — D696 Thrombocytopenia, unspecified: Secondary | ICD-10-CM

## 2011-12-07 DIAGNOSIS — K7460 Unspecified cirrhosis of liver: Secondary | ICD-10-CM

## 2011-12-07 DIAGNOSIS — I10 Essential (primary) hypertension: Secondary | ICD-10-CM

## 2011-12-07 LAB — COMPREHENSIVE METABOLIC PANEL
AST: 36 U/L (ref 0–37)
Alkaline Phosphatase: 141 U/L — ABNORMAL HIGH (ref 39–117)
BUN: 18 mg/dL (ref 6–23)
Creatinine, Ser: 0.6 mg/dL (ref 0.4–1.5)
Potassium: 4.7 mEq/L (ref 3.5–5.1)
Total Bilirubin: 0.7 mg/dL (ref 0.3–1.2)

## 2011-12-07 LAB — CBC WITH DIFFERENTIAL/PLATELET
Basophils Relative: 1 % (ref 0.0–3.0)
Eosinophils Absolute: 0.1 10*3/uL (ref 0.0–0.7)
Eosinophils Relative: 2.8 % (ref 0.0–5.0)
HCT: 34.7 % — ABNORMAL LOW (ref 39.0–52.0)
Hemoglobin: 11.9 g/dL — ABNORMAL LOW (ref 13.0–17.0)
MCHC: 34.3 g/dL (ref 30.0–36.0)
MCV: 91.8 fl (ref 78.0–100.0)
Monocytes Absolute: 0.4 10*3/uL (ref 0.1–1.0)
Neutro Abs: 2.9 10*3/uL (ref 1.4–7.7)
RBC: 3.78 Mil/uL — ABNORMAL LOW (ref 4.22–5.81)
WBC: 4 10*3/uL — ABNORMAL LOW (ref 4.5–10.5)

## 2011-12-07 NOTE — Telephone Encounter (Signed)
We need to get the results from [facility] -- the EMG/NCS results before I could comment.  Let him know we are requesting the results.  Thx.

## 2011-12-07 NOTE — Progress Notes (Signed)
OFFICE VISIT  12/11/2011   CC:  Chief Complaint  Patient presents with  . Follow-up    1 month follow up     HPI:    Patient is [name] who presents for f/u DM 2, peripheral neuropathy, and cirrhosis with portal HTN/ascites. Recently got repeat flex sig at Dr. Regino Schultze and this documented healing of his rectal perf/fistula.  Gluc's fasting 98-120s range, pre-supper close to 200 consistently.  Lantus dosing at 42 U qhs, with mealtime novolog at 07-28-09 plus SSI.  Still reports vague tingling/asleep feeling in hands and feet.  Got diab retpathy eye exam yesterday and +retinopthy noted, laser therapy was done.  Overall his LE weakness and general dysfunction is improving slowly/gradually.  He is compliant with PT but doesn't do much on the other 5 days per week. Says appetite is fine, energy level good.  No GI complaints, no urinary problems. Takes lasix 40mg  qd and aldactone 50mg  qod.  Wt gain noted, but patient says he does not feel fluid overloaded.     Past Medical History  Diagnosis Date  . Low back pain 04/2011    Plain film 04/2011: spondylosis/old osteophyte vs endplate fx L3  . Abnormal EKG   . Heart murmur     Diastolic (06/30/11): ECHO 07/2011 showed mild AS and LVH (but no problem to correlate with diastolic murmur).  TEE 09/2011 showed small PFO, bicuspid aortic valve but no stenosis or regurg, small pericardial effusion.  . Hypertension 06/2011  . Hematuria 07/27/2011, 08/25/11  . UTI (lower urinary tract infection) 07/2011    MSSA  . Diabetes mellitus   . 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  Gen surg and GI recommended flagyl and cipro x 2 wks..  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  . Cholelithiasis 09/2011    . Pyelonephritis 09/2011    MSSA (+bacteremia); iv ancef via HH x 4 wks  . Alcoholism   . 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 Knee arthroscopy     Bilateral    Outpatient Prescriptions Prior to Visit  Medication Sig Dispense Refill  . acetaminophen (TYLENOL) 500 MG tablet Take 500 mg by mouth every 8 (eight) hours as needed.        . ferrous sulfate 325 (65 FE) MG tablet Take 325 mg by mouth daily.        . furosemide (LASIX) 40 MG tablet 1 tab po qod  15 tablet  1  . glucose blood (FREESTYLE LITE) test strip PRN  32 each  12  . insulin aspart (NOVOLOG FLEXPEN) 100 UNIT/ML injection Base of 8 qAC BF, 10 qAC lunch, and 10 qAC supper plus sliding scale  5 pen  5  . insulin glargine (LANTUS SOLOSTAR) 100 UNIT/ML injection Inject 42 Units into the skin at bedtime.  15 mL  5  . Insulin Pen Needle 32G X 6 MM MISC Use with insulin pen for 4 injections daily  120 each  10  . Lancets (FREESTYLE) lancets PRN  100 each  5  . Multiple Vitamins-Minerals (MULTI FOR HIM) PACK Take 1 each by mouth daily.        . potassium chloride SA (K-DUR,KLOR-CON)  20 MEQ tablet Take 1 tablet (20 mEq total) by mouth daily.  60 tablet  1  . lisinopril (PRINIVIL,ZESTRIL) 2.5 MG tablet Take 1 tablet (2.5 mg total) by mouth daily.  30 tablet  2  . Elastic Bandages & Supports (MEDICAL COMPRESSION STOCKINGS) MISC 1 Units by Does not apply route daily as needed. Apply to lower extremities daily as needed for lower extremity swelling  1 each  3  . glucagon 1 MG injection Follow package directions for low blood sugar (for emergency use for symptomatic hypoglycemia)  1 each  1  . spironolactone (ALDACTONE) 50 MG tablet Take 1 tablet (50 mg total) by mouth daily.  30 tablet  1   Facility-Administered Medications Prior to Visit  Medication Dose Route Frequency Provider Last Rate Last Dose  . 0.9 %  sodium chloride infusion  500 mL Intravenous Continuous Hart Carwin,  MD        Allergies  Allergen Reactions  . Sulfa Antibiotics   . Naproxen Nausea Only   Past family and social history reviewed and there are no changes since the patient's last office visit with me--he remains out of work, long term disability has kicked in.  ROS As per HPI.  Also, no bruising, no petechiae, no low back pain lately.  No HAs or neck pain.  No n/v/d/constipation.  PE: Blood pressure 159/85, pulse 71, temperature 97.6 F (36.4 C), temperature source Oral, height 5\' 10"  (1.778 m), weight 199 lb 12.8 oz (90.629 kg), SpO2 100.00%. Gen: Alert, well appearing.  Patient is oriented to person, place, time, and situation. ENT: Eyes: no injection, icteris, swelling, or exudate.  EOMI, PERRLA. Nose: no drainage or turbinate edema/swelling.  No injection or focal lesion.  Mouth: lips without lesion/swelling.  Oral mucosa pink and moist.   Oropharynx without erythema, exudate, or swelling.   Neck - No masses or thyromegaly or limitation in range of motion CV: RRR, no m/r/g.   LUNGS: CTA bilat, nonlabored resps, good aeration in all lung fields. ABD: soft mildly rotund/distended, BS normal.  Nontener.  No HSM or mass or bruit.  +Flank dullness noted.  No fluid wave, no tenseness of abdomen. EXT: no clubbing, cyanosis, or edema.   Hyperkeratosis and hemosiderin deposition on both anterior lower legs/ankles.   SKIN: no jaundice or pallor.    LABS:  None today  IMPRESSION AND PLAN:  Type II or unspecified type diabetes mellitus without mention of complication, uncontrolled Continues to improve control.    Advised pt to increase his novolog base to 12 + SSI at lunch.   Otherwise, continue current insulin regimen. Urine microalb/cr neg 10/2011.   +DPN.   +Diab retinopathy, now s/p laser treatment (Dr. Allyne Gee). HbA1c due at f/u in 37mo.   Peripheral neuropathy Pelvic/autonomic portion of this is completely resolved.  LE sx's improving. Doing PT 2 days per week and showing  gradual improvement in stamina, strength, and balance.  However, he is doing little else besides this (nothing on non-rehab days).   Still not ready for previous level of work.  Hopefully he'll be able to try starting back at work after f/u in 37mo. Still awaiting results from testing (EMG/NCS) done by [facility].  Anticipate Dr. Modesto Charon  Correspondence soon.   Cirrhosis With portal HTN and ascites. Recent wt gain likely combo of fluid excess/caloric excess/inslin effect.    I recommended he increase his aldactone to 50mg  qd.  Continue lasix 40mq qd.  Perforation of colon Resolved. No further sx's.  Thrombocytopenia Initial abd u/s 06/2011 showed no liver or spleen abnormality, so I had planned for him to see Hem Onc when he was willing/able. 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 Will recheck platelet count today.  HTN (hypertension), benign BP up a bit today, likely reflecting a bit of volume overload.  Aldactone increased to 50mg  qd as noted above.  Watch potassium: recheck BMET 1 wk.  Blood loss anemia Normocytic, likely combo of recent blood loss + anemia of chronic disease. Recommend he continue qd ferrous sulfate 325mg  qd. Recheck CBC today.     FOLLOW UP: Return in about 1 month (around 01/07/2012) for f/u DM 2, PN, cirrhosis.

## 2011-12-07 NOTE — Telephone Encounter (Signed)
Pt has questions about his results from [facility] before he makes his fu appt with [place]. Does he need to go back to [facility] as well? He will be in PT from 3-4 today (12/07/11) but should be available any other time.

## 2011-12-08 ENCOUNTER — Telehealth: Payer: Self-pay

## 2011-12-08 DIAGNOSIS — E875 Hyperkalemia: Secondary | ICD-10-CM

## 2011-12-08 DIAGNOSIS — D696 Thrombocytopenia, unspecified: Secondary | ICD-10-CM

## 2011-12-08 NOTE — Telephone Encounter (Signed)
Please advise spironolactone refill?

## 2011-12-09 ENCOUNTER — Ambulatory Visit: Payer: Managed Care, Other (non HMO) | Admitting: Physical Therapy

## 2011-12-09 ENCOUNTER — Other Ambulatory Visit: Payer: Self-pay | Admitting: Family Medicine

## 2011-12-09 DIAGNOSIS — E875 Hyperkalemia: Secondary | ICD-10-CM

## 2011-12-09 DIAGNOSIS — E876 Hypokalemia: Secondary | ICD-10-CM

## 2011-12-09 DIAGNOSIS — D696 Thrombocytopenia, unspecified: Secondary | ICD-10-CM

## 2011-12-09 MED ORDER — SPIRONOLACTONE 50 MG PO TABS: 50.0000 mg | ORAL_TABLET | Freq: Every day | ORAL | Status: DC

## 2011-12-09 NOTE — Telephone Encounter (Signed)
LM for pt stating we were waiting on the results from [facility]. Advised pt to call if he had any further questions.

## 2011-12-09 NOTE — Telephone Encounter (Signed)
RF sent to pharmacy just now.  thx

## 2011-12-09 NOTE — Progress Notes (Signed)
Pt wanted to have labs drawn at [facility].  Orders for office d/c'd and new orders entered.

## 2011-12-11 NOTE — Assessment & Plan Note (Signed)
Pelvic/autonomic portion of this is completely resolved.  LE sx's improving. Doing PT 2 days per week and showing gradual improvement in stamina, strength, and balance.  However, he is doing little else besides this (nothing on non-rehab days).   Still not ready for previous level of work.  Hopefully he'll be able to try starting back at work after f/u in 58mo. Still awaiting results from testing (EMG/NCS) done by [facility].  Anticipate Dr. Modesto Charon  Correspondence soon.

## 2011-12-11 NOTE — Assessment & Plan Note (Signed)
With portal HTN and ascites. Recent wt gain likely combo of fluid excess/caloric excess/inslin effect.    I recommended he increase his aldactone to 50mg  qd.  Continue lasix 40mq qd.

## 2011-12-11 NOTE — Assessment & Plan Note (Signed)
BP up a bit today, likely reflecting a bit of volume overload.  Aldactone increased to 50mg  qd as noted above.  Watch potassium: recheck BMET 1 wk.

## 2011-12-11 NOTE — Assessment & Plan Note (Addendum)
Continues to improve control.    Advised pt to increase his novolog base to 12 + SSI at lunch.   Otherwise, continue current insulin regimen. Urine microalb/cr neg 10/2011.   +DPN.   +Diab retinopathy, now s/p laser treatment (Dr. Allyne Gee). HbA1c due at f/u in 55mo.

## 2011-12-11 NOTE — Assessment & Plan Note (Signed)
Initial abd u/s 06/2011 showed no liver or spleen abnormality, so I had planned for him to see Hem Onc when he was willing/able. 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 Will recheck platelet count today.

## 2011-12-11 NOTE — Assessment & Plan Note (Signed)
Resolved. No further sx's.

## 2011-12-11 NOTE — Assessment & Plan Note (Signed)
Normocytic, likely combo of recent blood loss + anemia of chronic disease. Recommend he continue qd ferrous sulfate 325mg  qd. Recheck CBC today.

## 2011-12-14 ENCOUNTER — Ambulatory Visit: Payer: Managed Care, Other (non HMO) | Admitting: Physical Therapy

## 2011-12-14 LAB — CBC WITH DIFFERENTIAL/PLATELET
Basophils Absolute: 0.1 10*3/uL (ref 0.0–0.1)
HCT: 37.8 % — ABNORMAL LOW (ref 39.0–52.0)
Lymphocytes Relative: 23 % (ref 12–46)
Monocytes Absolute: 0.3 10*3/uL (ref 0.1–1.0)
Neutro Abs: 3.1 10*3/uL (ref 1.7–7.7)
Platelets: 67 10*3/uL — ABNORMAL LOW (ref 150–400)
RBC: 4.25 MIL/uL (ref 4.22–5.81)
RDW: 13 % (ref 11.5–15.5)
WBC: 4.7 10*3/uL (ref 4.0–10.5)

## 2011-12-14 LAB — GLUCOSE, RANDOM: Glucose, Bld: 124 mg/dL — ABNORMAL HIGH (ref 70–99)

## 2011-12-15 ENCOUNTER — Ambulatory Visit: Payer: Managed Care, Other (non HMO) | Admitting: Physical Therapy

## 2011-12-15 ENCOUNTER — Telehealth: Payer: Self-pay | Admitting: *Deleted

## 2011-12-15 NOTE — Telephone Encounter (Signed)
Called and spoke with the patient. Information given as directed by Dr. Modesto Charon. The patient states he has a follow up appointment at [facility] on Jan., 4, 2013. Instructed to call our office to schedule an appointment to follow up with Dr. Modesto Charon after his [facility] appointment. The patient agrees with this plan.

## 2011-12-15 NOTE — Telephone Encounter (Signed)
Pt would like to know if he would be able to work at least 1/2 days.  Pt has one more therapy session.  Pt feels he would be able to gain strength faster if he was at work.  Pt states financially he needs to work, and this is not a busy time at work. 2-Pt also states that he did hear from Dr. Nash Dimmer office today, but is unsure of exact results from nerve test at [facility].  Advised pt that hard copy has not been scanned into chart and we do not know results at this time. 3-Pt is having nausea and vomiting after therapy.  Pt says he will usually vomit at therapy, but today did not vomit until he had returned home.  States he eats a small breakfast, takes his meds and insulin and then goes to therapy.  Pt will get hot and clammy and feel queasy then will usually vomit. Any advice?

## 2011-12-15 NOTE — Telephone Encounter (Signed)
Hi Jan - Could you call [name] and let him know we got the results of his EMG/NCS from [place].  They show a peripheral neuropathy most likely consistent with his diabetes.  However, I would like him to go back to [facility] to discuss his results just to make sure they agree with my interpretation.  He can then set up a follow up appointment with me after that - no rush - just when he wants to come back.

## 2011-12-16 NOTE — Telephone Encounter (Signed)
My advice is that HE IS NOT READY TO GO BACK TO WORK YET.  He can increase his activity at home on his own as a prelude to going back to work.  When I get full results of [facility] studies we'll let him know.  Thx--PM

## 2011-12-16 NOTE — Telephone Encounter (Signed)
Notified pt.  17 minutes talking with patient about his concerns.  He is frustrated that there has been no determination on LTD.  He has not had any income in one month.  He is frustrated at himself for not taking care of himself and getting himself into this position.  Reassured pt that getting better takes time and to not lose hope. Advised pt to call before his appt on 1/17 if he needs [place].  Pt is agreeable.

## 2011-12-19 ENCOUNTER — Ambulatory Visit: Payer: Managed Care, Other (non HMO) | Admitting: Physical Therapy

## 2011-12-25 ENCOUNTER — Encounter: Payer: Self-pay | Admitting: Family Medicine

## 2011-12-26 ENCOUNTER — Encounter: Payer: Self-pay | Admitting: Family Medicine

## 2011-12-29 ENCOUNTER — Ambulatory Visit: Payer: Managed Care, Other (non HMO) | Attending: Family Medicine | Admitting: Physical Therapy

## 2011-12-29 DIAGNOSIS — M6281 Muscle weakness (generalized): Secondary | ICD-10-CM | POA: Insufficient documentation

## 2011-12-29 DIAGNOSIS — M545 Low back pain, unspecified: Secondary | ICD-10-CM | POA: Insufficient documentation

## 2011-12-29 DIAGNOSIS — IMO0001 Reserved for inherently not codable concepts without codable children: Secondary | ICD-10-CM | POA: Insufficient documentation

## 2011-12-31 IMAGING — CT CT ABD-PELV W/O CM
2 of 4 series · 16 of 46 positions shown, 18 images · non-contrast
Comparison: None.

CLINICAL DATA: Fever leukocytosis.

CT ABDOMEN AND PELVIS WITHOUT CONTRAST
TECHNIQUE: Multidetector CT imaging of the abdomen and pelvis was
performed following the standard protocol without intravenous
contrast.

[Series 2: abd_pel 5.0 b40f st · axial · 0.88mm/px · z∈[-498,-28]mm · 13 of 102 slices shown, 15 images]
[im 4/102  soft-tissue]
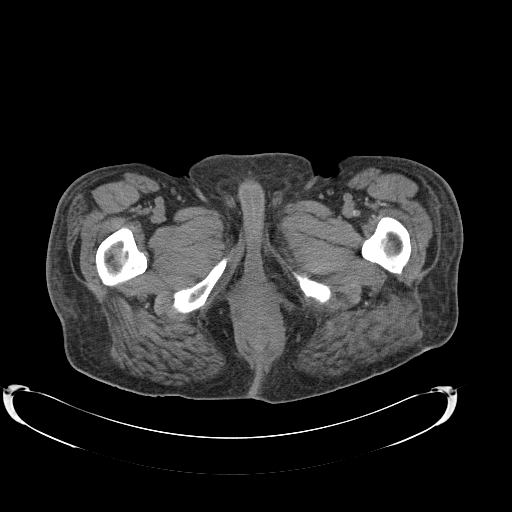
[im 4/102  bone]
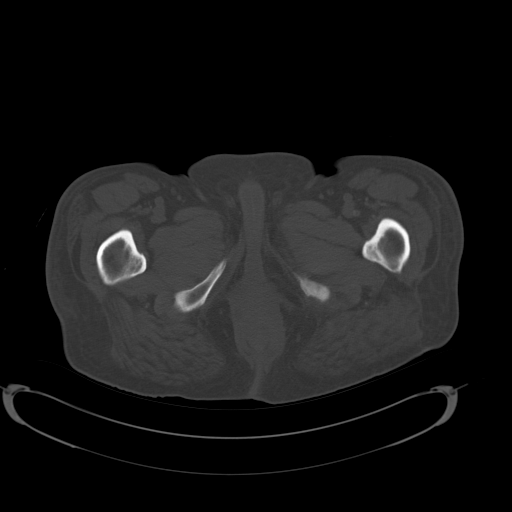
[im 12/102  soft-tissue]
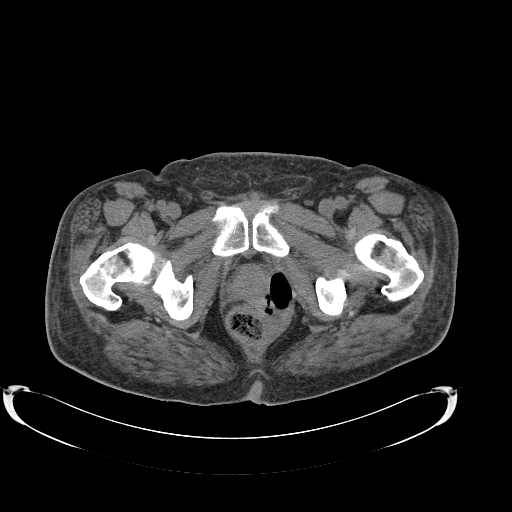
[im 20/102  soft-tissue]
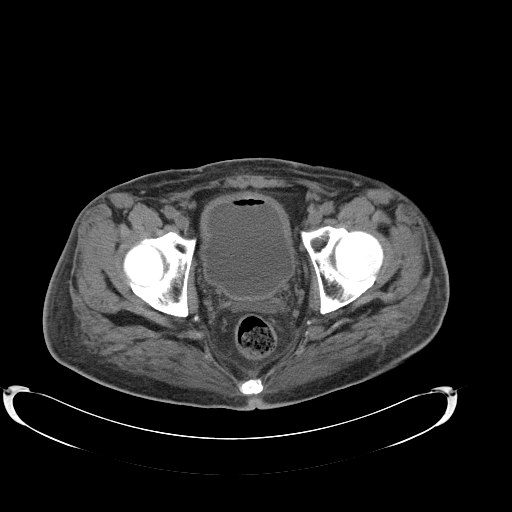
[im 28/102  soft-tissue]
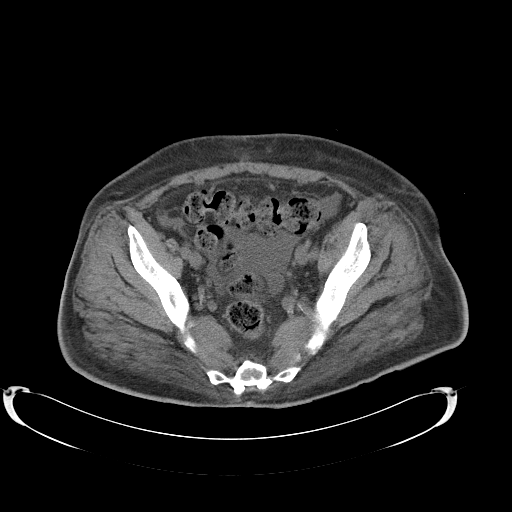
[im 35/102  soft-tissue]
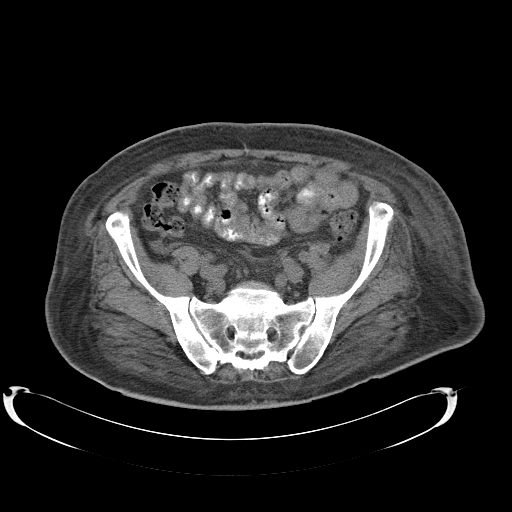
[im 43/102  soft-tissue]
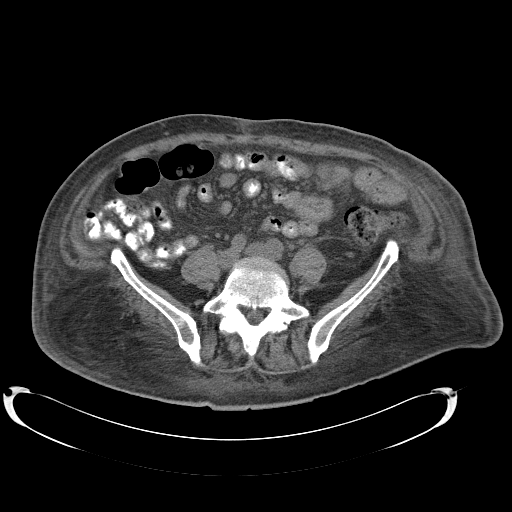
[im 51/102  soft-tissue]
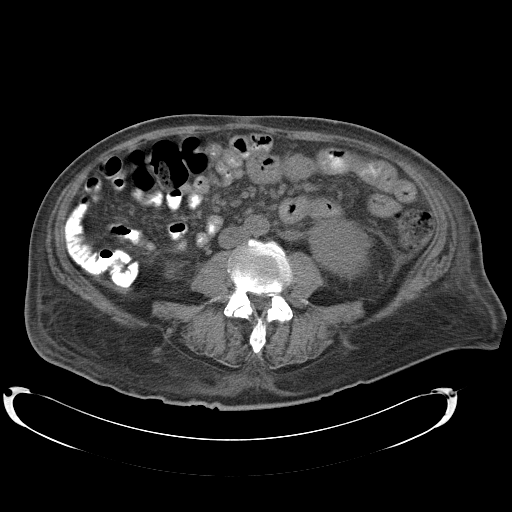
[im 59/102  soft-tissue]
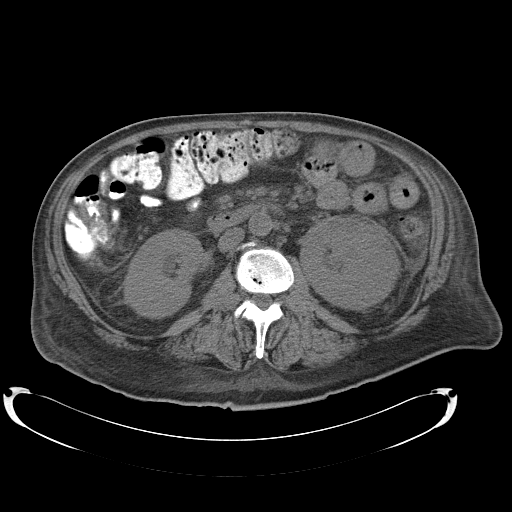
[im 67/102  soft-tissue]
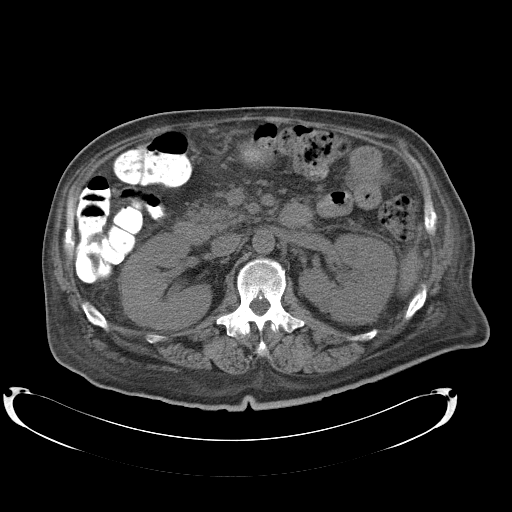
[im 67/102  bone]
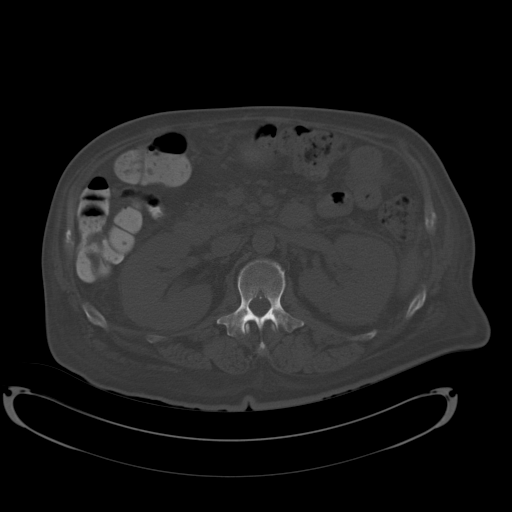
[im 74/102  soft-tissue]
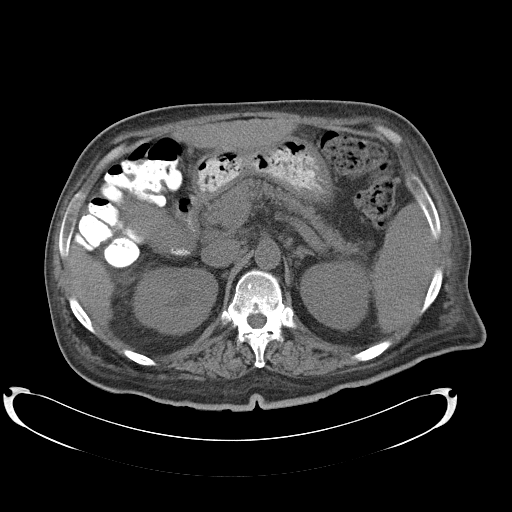
[im 82/102  soft-tissue]
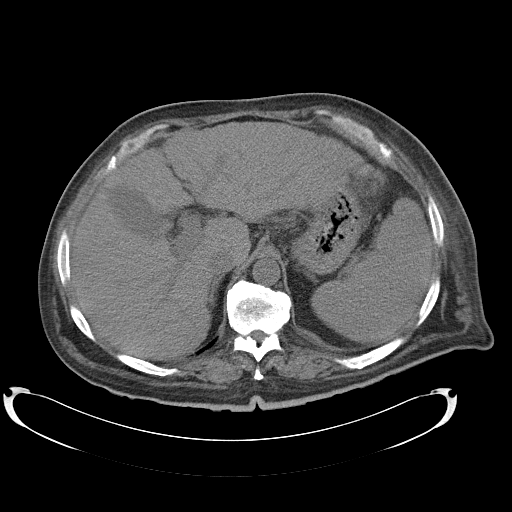
[im 90/102  soft-tissue]
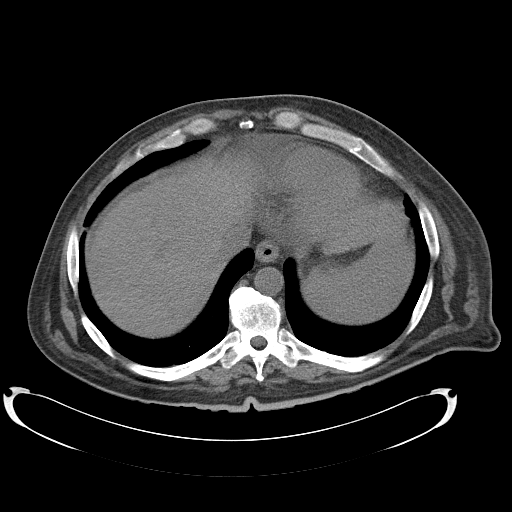
[im 98/102  soft-tissue]
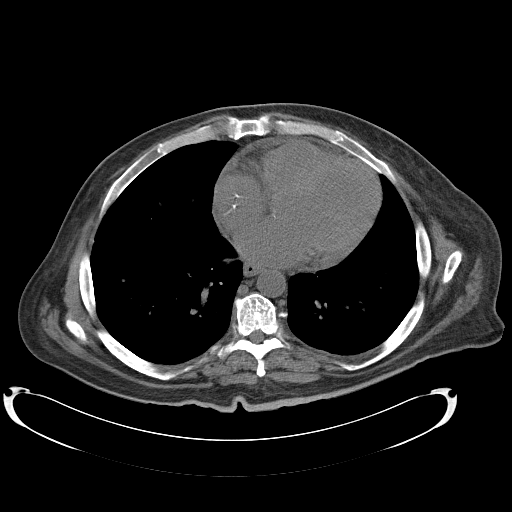

[Series 602: coronal abdomen · coronal · 1.03mm/px · 3 of 137 slices shown]
[im 46/137  soft-tissue]
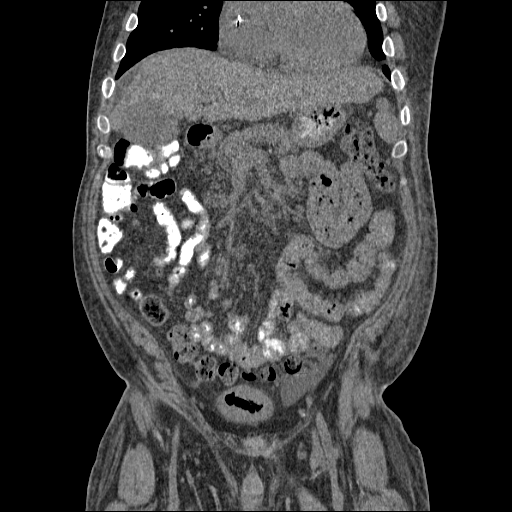
[im 61/137  soft-tissue]
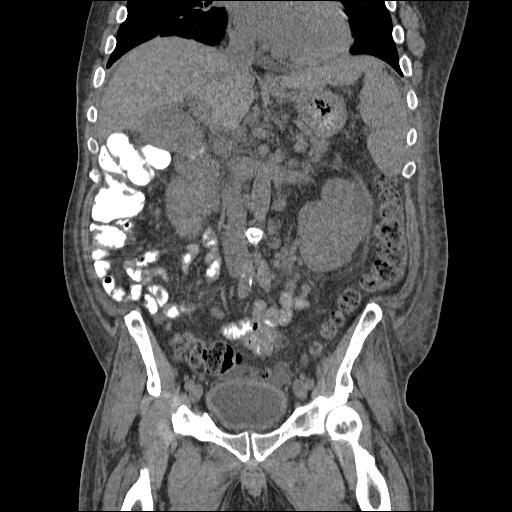
[im 76/137  soft-tissue]
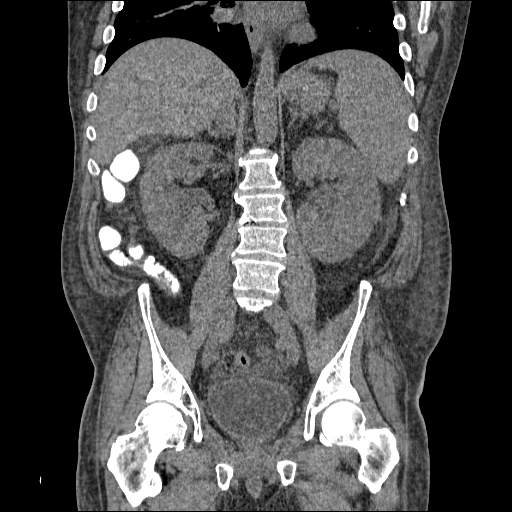

[16 of 46 positions shown; findings below may reference images not displayed]

FINDINGS: The liver has a diffusely nodular contour, consistent
with cirrhosis.  Tiny amount of fluid is seen adjacent to the right
hepatic lobe.  The spleen measures 13.7 cm in cranial caudal
length, borderline enlarged.  Stomach and duodenum are
unremarkable.  The pancreas is normal in appearance.

Gallbladder is distended with layering tiny calcified stones.  No
intra or extrahepatic biliary duct dilatation. Adrenal glands are
normal.

The left kidney appears swollen and edematous.  There are multiple
areas of ill-defined low density in the left kidney which appear to
reside in a subcapsular location.  Overall, overall, imaging
features are suspicious for pyelonephritis with scattered areas of
subcapsular necrosis / abscess formation.  There is a somewhat
wedge-shaped low density lesion in the lower pole of the right
kidney which are also suggest segmental edema from pyelonephritis.
14 mm hyperdense lesion in the interpolar right kidney may be a
cyst complicated by proteinaceous debris or hemorrhage, but solid
neoplasm can certainly have this appearance.

There is no abdominal aortic aneurysm.  No evidence for free fluid
in the abdomen.

Imaging through the pelvis shows a small amount of intraperitoneal
free fluid.  There is air in the urinary bladder which is
presumably secondary to recent instrumentation although infection
could have this appearance.

Additionally, the patient has intraperitoneal free air in the
pelvic floor, between the prostate gland in the rectum it tracks up
along the left side of the prostate gland.

No pelvic sidewall lymphadenopathy.  No evidence for colonic
diverticulitis.  The terminal ileum is normal. The appendix is not
visualized, but there is no edema or inflammation in the region of
the cecum.

The patient has diffuse body wall edema.  No acute bony
abnormality.
IMPRESSION: Cirrhosis with a small amount of peri hepatic fluid.

Abnormal kidneys bilaterally.  There is a question of subcapsular
renal abscesses on the left with the segmental edema in the right
kidney suggesting pyelonephritis.

14 mm hyperdense lesion in the right kidney may be a cyst
complicated by proteinaceous debris or hemorrhage.  As solid
neoplasm can have a similar imaging appearance, follow-up will be
required.  MRI without and with contrast may prove helpful to
further evaluate if the patient has normal renal function.

Distended gallbladder with layering tiny dependent gallstones.

Free air in the extraperitoneal pelvic floor, tracking between the
rectum the prostate gland and up along the left aspect of the
prostate.  Rectal or urethral perforation would be a consideration.

I personally discussed these findings by telephone with Dr. Taushanova
Sandrine Christelle at approximately 1222 hours on 10/02/2011.

## 2012-01-03 IMAGING — CT CT PELVIS LIMITED W/O CM
1 series · 1 of 32 positions shown · non-contrast
Comparison: 10/02/2011

CLINICAL DATA: .  RECTAL FISTULA, SMALL PERIRECTAL COLLECTION
CONCERNING FOR ABSCESS

CT PELVIS WITHOUT CONTRAST LIMITED
TECHNIQUE: Multidetector CT imaging of the pelvis was performed
following the standard protocol without intravenous contrast.

[Series 2: localizer · axial · 5.0mm · 0.91mm/px · 1 of 33 slices shown]
[im 17/33]
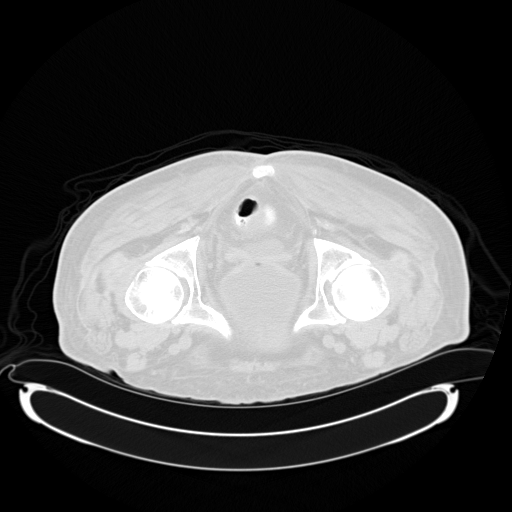

[1 of 32 positions shown; findings below may reference images not displayed]

FINDINGS: In preparation for a perirectal drain placement, the
patient was positioned prone.  Noncontrast localization CT was
performed through the pelvis.  Small amount of dependent ascites in
the pelvis.  Air within the bladder.  Residual contrast in the
colon.  In the left perirectal area, there is a complex septated
air only collection.  The contrast from this area from the previous
CT has drained presumably through the known rectal fistula.  No
significant fluid component to warrant drain catheter placement.
Slight rectal wall thickening noted distally.
IMPRESSION: Perirectal extraperitoneal air collection compatible with a known
tiny rectal fistula/perforation seen on endoscopy.  No significant
fluid component or developing abscess.  Therefore drain catheter
placement was not performed at this time.

Small amount of pelvic ascites.

Findings discussed with Dr. Hemal

## 2012-01-05 ENCOUNTER — Ambulatory Visit (INDEPENDENT_AMBULATORY_CARE_PROVIDER_SITE_OTHER): Payer: Managed Care, Other (non HMO) | Admitting: Family Medicine

## 2012-01-05 ENCOUNTER — Encounter: Payer: Self-pay | Admitting: Family Medicine

## 2012-01-05 VITALS — BP 120/76 | HR 80 | Ht 69.0 in | Wt 200.0 lb

## 2012-01-05 DIAGNOSIS — G629 Polyneuropathy, unspecified: Secondary | ICD-10-CM

## 2012-01-05 DIAGNOSIS — E1169 Type 2 diabetes mellitus with other specified complication: Secondary | ICD-10-CM

## 2012-01-05 DIAGNOSIS — D500 Iron deficiency anemia secondary to blood loss (chronic): Secondary | ICD-10-CM

## 2012-01-05 DIAGNOSIS — Z23 Encounter for immunization: Secondary | ICD-10-CM

## 2012-01-05 DIAGNOSIS — I10 Essential (primary) hypertension: Secondary | ICD-10-CM

## 2012-01-05 DIAGNOSIS — K7460 Unspecified cirrhosis of liver: Secondary | ICD-10-CM

## 2012-01-05 DIAGNOSIS — G609 Hereditary and idiopathic neuropathy, unspecified: Secondary | ICD-10-CM

## 2012-01-05 LAB — HEMOGLOBIN A1C: Hgb A1c MFr Bld: 8.2 % — ABNORMAL HIGH (ref 4.6–6.5)

## 2012-01-05 NOTE — Progress Notes (Signed)
OFFICE NOTE  01/07/2012  CC:  Chief Complaint  Patient presents with  . Follow-up    DM, cirrhosis     HPI:   Patient is a 51 y.o. Caucasian male who is here for 1 mo f/u cirrhosis/portal HTN, DM 2, peripheral neuropathy. Says he's feeling good, has good appetite and good energy level.  Works out 30 min daily, spends the rest of his day sedentary but is still adamant that he feels like he could return to work now [employer] TIME with NO RESTRICTIONS. Has had a recent URI which is now resolving.  Denies any abd pain, swelling, or LE swelling.  He is compliant with meds but says he doesn't need the compression stockings now. Fasting gluc usually <126, 2H PP 160-200. Has mild tingling in feet, otherwise no LE complaints now.   No problems with bowels or bladder function.  Of note, he did get left eye laser surgery for diab retinpathy 12/27/11.  Pertinent PMH:  Past Medical History  Diagnosis Date  . Low back pain 04/2011    Plain film 04/2011: spondylosis/old osteophyte vs endplate fx L3  . Abnormal EKG   . Heart murmur     ECHO 07/2011 showed mild AS and LVH.  TEE 09/2011 showed small PFO, bicuspid aortic valve but no stenosis or regurg.  [name] Hypertension 06/2011  . Hematuria 07/27/2011, 08/25/11    +infection-related  . UTI (lower urinary tract infection) 07/2011    MSSA  . Diabetes mellitus     Diabetic retinopathy and peripheral neuropathy.  . 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  Gen surg and GI recommended flagyl and cipro x 2 wks..  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  . Cholelithiasis 09/2011  . Pyelonephritis 09/2011    MSSA (+bacteremia); iv ancef via HH x 4 wks  . Alcoholism   . Peripheral neuropathy  2012    Diabetic:  Autonomic (pelvic) and LE polyneuropathy--[facility] testing showed that it is likely from DM   Past surgical, social, and family history reviewed and no changes noted since last office visit.  MEDS;   Outpatient Prescriptions Prior to Visit  Medication Sig Dispense Refill  . acetaminophen (TYLENOL) 500 MG tablet Take 500 mg by mouth every 8 (eight) hours as needed.        . ferrous sulfate 325 (65 FE) MG tablet Take 325 mg by mouth daily.        [name] glucagon 1 MG injection Follow package directions for low blood sugar (for emergency use for symptomatic hypoglycemia)  1 each  1  . glucose blood (FREESTYLE LITE) test strip PRN  32 each  12  . insulin aspart (NOVOLOG FLEXPEN) 100 UNIT/ML injection Base of 8 qAC BF, 10 qAC lunch, and 10 qAC supper plus sliding scale  5 pen  5  . insulin glargine (LANTUS SOLOSTAR) 100 UNIT/ML injection Inject 42 Units into the skin at bedtime.  15 mL  5  . Insulin Pen Needle 32G X 6 MM MISC Use with insulin pen for 4 injections daily  120 each  10  . Lancets (FREESTYLE) lancets PRN  100 each  5  . lisinopril (PRINIVIL,ZESTRIL) 2.5 MG tablet TAKE 1 TABLET BY MOUTH EVERY DAY  30 tablet  2  . Multiple Vitamins-Minerals (MULTI FOR HIM) PACK Take 1 each by mouth daily.        . potassium chloride SA (K-DUR,KLOR-CON) 20 MEQ tablet Take 1 tablet (20 mEq total) by mouth daily.  60 tablet  1  . spironolactone (ALDACTONE) 50 MG tablet Take 1 tablet (50 mg total) by mouth daily.  30 tablet  6  . Elastic Bandages & Supports (MEDICAL COMPRESSION STOCKINGS) MISC 1 Units by Does not apply route daily as needed. Apply to lower extremities daily as needed for lower extremity swelling  1 each  3  . furosemide (LASIX) 40 MG tablet 1 tab po qod  15 tablet  1  . 0.9 %  sodium chloride infusion         PE: Blood pressure 120/76, pulse 80, height 5\' 9"  (1.753 m), weight 200 lb (90.719 kg). Wt is stable compared to visit 18mo ago. Gen: Alert, well appearing.  Patient is  oriented to person, place, time, and situation. ENT: no scleral icterus. Oropharynx clear.   Neck - No masses or thyromegaly or limitation in range of motion CV: RRR, no m/r/g.   LUNGS: CTA bilat, nonlabored resps, good aeration in all lung fields. ABD: soft, NT, ND, BS normal.  No hepatospenomegaly or mass.  No bruits. EXT: no clubbing, cyanosis, or edema.  Neuro: CN 2-12 intact bilaterally, strength 5/5 in proximal and distal upper extremities and lower extremities bilaterally.    No tremor.  No ataxia.   LABS: none today  IMPRESSION AND PLAN:  Peripheral neuropathy Diabetic peripheral neuropathy: first the pelvic autonomic portion of this resolved and now it seems the LE motor/somatosensory portion is much improved. Seems to be affecting him minimally now.   He feels like his strength is good enough to try to return to work full time now, so I wrote a letter releasing him back to full time duty today. He's currently not on any meds specifically for PN, but will continue with more aggressive DM control.  DM type 2, causing other manifestations, not at goal Control improving with Lantus and with mealtime insulin, plus he has added more exercise and has done some dietary changes. Continue qid monitoring, likely needs to titrate mealtime insulin up some but will see what HbA1c shows today.  Cirrhosis With portal HTN/edema: much improved.  Fluid status looks excellent. Continue current diuretics and K+ supplement.  HTN (hypertension), benign Problem stable.  Continue current medications and diet appropriate for this condition.  We have reviewed our general long term plan for this problem and also reviewed symptoms and signs that should prompt the patient to call or return to the office.   Blood loss anemia Nearly resolved last lab check 12/07/11. Continue iron replacement for 4 more months.   Tdap IM given today.  FOLLOW UP:  Return in about 3 months (around 04/04/2012) for f/u DM  2, cirrhosis/portal HTN, periph neurop.

## 2012-01-07 ENCOUNTER — Encounter: Payer: Self-pay | Admitting: Family Medicine

## 2012-01-07 NOTE — Assessment & Plan Note (Signed)
Nearly resolved last lab check 12/07/11. Continue iron replacement for 4 more months.

## 2012-01-07 NOTE — Assessment & Plan Note (Signed)
With portal HTN/edema: much improved.  Fluid status looks excellent. Continue current diuretics and K+ supplement.

## 2012-01-07 NOTE — Assessment & Plan Note (Signed)
Problem stable.  Continue current medications and diet appropriate for this condition.  We have reviewed our general long term plan for this problem and also reviewed symptoms and signs that should prompt the patient to call or return to the office.  

## 2012-01-07 NOTE — Assessment & Plan Note (Signed)
Control improving with Lantus and with mealtime insulin, plus he has added more exercise and has done some dietary changes. Continue qid monitoring, likely needs to titrate mealtime insulin up some but will see what HbA1c shows today.

## 2012-01-07 NOTE — Assessment & Plan Note (Addendum)
Diabetic peripheral neuropathy: first the pelvic autonomic portion of this resolved and now it seems the LE motor/somatosensory portion is much improved. Seems to be affecting him minimally now.   He feels like his strength is good enough to try to return to work full time now, so I wrote a letter releasing him back to full time duty today. He's currently not on any meds specifically for PN, but will continue with more aggressive DM control.

## 2012-01-09 ENCOUNTER — Other Ambulatory Visit: Payer: Self-pay

## 2012-01-09 MED ORDER — LISINOPRIL 2.5 MG PO TABS: 2.5000 mg | ORAL_TABLET | Freq: Every day | ORAL | Status: DC

## 2012-01-09 MED ORDER — SPIRONOLACTONE 50 MG PO TABS: 50.0000 mg | ORAL_TABLET | Freq: Every day | ORAL | Status: DC

## 2012-01-09 MED ORDER — FUROSEMIDE 40 MG PO TABS: 40.0000 mg | ORAL_TABLET | Freq: Every day | ORAL | Status: DC

## 2012-01-09 MED ORDER — INSULIN ASPART 100 UNIT/ML ~~LOC~~ SOLN: SUBCUTANEOUS | Status: DC

## 2012-01-09 MED ORDER — POTASSIUM CHLORIDE CRYS ER 20 MEQ PO TBCR: 20.0000 meq | EXTENDED_RELEASE_TABLET | Freq: Every day | ORAL | Status: DC

## 2012-01-09 MED ORDER — INSULIN GLARGINE 100 UNIT/ML ~~LOC~~ SOLN: 42.0000 [IU] | Freq: Every day | SUBCUTANEOUS | Status: DC

## 2012-01-12 ENCOUNTER — Telehealth: Payer: Self-pay | Admitting: *Deleted

## 2012-01-12 ENCOUNTER — Ambulatory Visit: Payer: Managed Care, Other (non HMO) | Admitting: Rehabilitation

## 2012-01-12 MED ORDER — INSULIN ASPART 100 UNIT/ML ~~LOC~~ SOLN: SUBCUTANEOUS | Status: DC

## 2012-01-12 MED ORDER — INSULIN GLARGINE 100 UNIT/ML ~~LOC~~ SOLN: 44.0000 [IU] | Freq: Every day | SUBCUTANEOUS | Status: DC

## 2012-01-12 NOTE — Telephone Encounter (Signed)
Pt states [facility] did not receive RX's sent on 01/09/12.  Spoke to [place] at [facility] and gave verbal for lantus and novolog with new dose.

## 2012-01-16 ENCOUNTER — Encounter: Payer: Self-pay | Admitting: Family Medicine

## 2012-01-16 ENCOUNTER — Other Ambulatory Visit: Payer: Self-pay | Admitting: Family Medicine

## 2012-01-16 ENCOUNTER — Ambulatory Visit (HOSPITAL_BASED_OUTPATIENT_CLINIC_OR_DEPARTMENT_OTHER)
Admission: RE | Admit: 2012-01-16 | Discharge: 2012-01-16 | Disposition: A | Discharge status: Home / Self Care | Payer: Managed Care, Other (non HMO) | Source: Ambulatory Visit | Attending: Family Medicine | Admitting: Family Medicine

## 2012-01-16 ENCOUNTER — Ambulatory Visit (INDEPENDENT_AMBULATORY_CARE_PROVIDER_SITE_OTHER): Payer: Managed Care, Other (non HMO) | Admitting: Family Medicine

## 2012-01-16 VITALS — BP 112/74 | HR 93 | Temp 98.4°F | Wt 205.0 lb

## 2012-01-16 DIAGNOSIS — S40019A Contusion of unspecified shoulder, initial encounter: Secondary | ICD-10-CM

## 2012-01-16 DIAGNOSIS — S40012A Contusion of left shoulder, initial encounter: Secondary | ICD-10-CM

## 2012-01-16 DIAGNOSIS — W19XXXA Unspecified fall, initial encounter: Secondary | ICD-10-CM

## 2012-01-16 DIAGNOSIS — S4292XA Fracture of left shoulder girdle, part unspecified, initial encounter for closed fracture: Secondary | ICD-10-CM

## 2012-01-16 MED ORDER — HYDROCODONE-ACETAMINOPHEN 5-500 MG PO TABS: ORAL_TABLET | ORAL | Status: DC

## 2012-01-16 NOTE — Progress Notes (Signed)
OFFICE NOTE  01/16/2012  CC:  Chief Complaint  Patient presents with  . Shoulder Injury    fell on ice Friday night in work parking lot     HPI: Patient is a 51 y.o. Caucasian male who is here for left shoulder pain. Was scraping ice off windshield 3 d/a, slipped and fell forward and left shoulder hit his car.  Sore at that time, a lot worse the next morning.  Stable since then.  Mild intensity pain left trap, left shoulder, left delt areas, without radiation.  Pain in these areas gets severe when he tries to abduct/flex/rotate/extend left shoulder or turn head to left.  No paresthesias. He has been working full time again and was doing well with this.  Pertinent PMH:  Past Medical History  Diagnosis Date  . Low back pain 04/2011    Plain film 04/2011: spondylosis/old osteophyte vs endplate fx L3  . Abnormal EKG   . Heart murmur     ECHO 07/2011 showed mild AS and LVH.  TEE 09/2011 showed small PFO, bicuspid aortic valve but no stenosis or regurg.  [name] Hypertension 06/2011  . Hematuria 07/27/2011, 08/25/11    +infection-related  . UTI (lower urinary tract infection) 07/2011    MSSA  . Diabetes mellitus     Diabetic retinopathy and peripheral neuropathy.  . 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  Gen surg and GI recommended flagyl and cipro x 2 wks..  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  . Cholelithiasis 09/2011  . Pyelonephritis 09/2011    MSSA (+bacteremia); iv ancef via HH x 4 wks  . Alcoholism   . Peripheral neuropathy 2012    Diabetic:  Autonomic (pelvic) and LE polyneuropathy--[facility] testing showed that it is likely from DM    MEDS:  Outpatient Prescriptions Prior to Visit  Medication Sig Dispense  Refill  . acetaminophen (TYLENOL) 500 MG tablet Take 500 mg by mouth every 8 (eight) hours as needed.        Jae Dire Bandages & Supports (MEDICAL COMPRESSION STOCKINGS) MISC 1 Units by Does not apply route daily as needed. Apply to lower extremities daily as needed for lower extremity swelling  1 each  3  . ferrous sulfate 325 (65 FE) MG tablet Take 325 mg by mouth daily.        . furosemide (LASIX) 40 MG tablet Take 1 tablet (40 mg total) by mouth daily.  90 tablet  1  . glucagon 1 MG injection Follow package directions for low blood sugar (for emergency use for symptomatic hypoglycemia)  1 each  1  . glucose blood (FREESTYLE LITE) test strip PRN  32 each  12  . insulin aspart (NOVOLOG FLEXPEN) 100 UNIT/ML injection Base of 10 qAC BF, 12 qAC lunch, and 12 qAC supper plus sliding scale  15 pen  1  . insulin glargine (LANTUS SOLOSTAR) 100 UNIT/ML injection Inject 44 Units into the skin at bedtime.  45 mL  1  . Insulin Pen Needle 32G X 6 MM MISC Use with insulin pen for 4 injections daily  120 each  10  . Lancets (FREESTYLE) lancets PRN  100 each  5  . lisinopril (PRINIVIL,ZESTRIL) 2.5 MG tablet Take 1  tablet (2.5 mg total) by mouth daily.  90 tablet  1  . Multiple Vitamins-Minerals (MULTI FOR HIM) PACK Take 1 each by mouth daily.        . potassium chloride SA (K-DUR,KLOR-CON) 20 MEQ tablet Take 1 tablet (20 mEq total) by mouth daily.  90 tablet  1  . spironolactone (ALDACTONE) 50 MG tablet Take 1 tablet (50 mg total) by mouth daily.  90 tablet  1    PE: Blood pressure 112/74, pulse 93, temperature 98.4 F (36.9 C), temperature source Temporal, weight 205 lb (92.987 kg). Gen: Alert, well appearing.  Patient is oriented to person, place, time, and situation. No tenderness in neck or trapezius areas.  Moderate TTP focally at left [facility] joint area and it looks depressed in left AC region compared to right.  TTP around left acromion process and on proximal delt region.  Question of subtle small bruise  at posterior border of acromion. Pain with resisted ER/IR/Abduction.  +empty can sign.  Negative drop sign. He can abduct to about 90 percent of normal on left but the last 45 degrees are very painful. UE strength 5/5 bilat.  No sensory deficits.  IMPRESSION AND PLAN: Left shoulder traumatic injury, likely contusion/traumatic rotator cuff bursitis. However, AC joint pathology needs to be ruled out.  Check x-ray of left shoulder today.  Rx for arm sling. vicodin 5/500 1-2 q6h prn pain, #30, RF x 1. Out of work x 1 wk--letter written today.  Gentle ROM exercises, ice, heat discussed.    FOLLOW UP: 1 wk

## 2012-01-18 ENCOUNTER — Ambulatory Visit (HOSPITAL_COMMUNITY)
Admission: RE | Admit: 2012-01-18 | Discharge: 2012-01-18 | Disposition: A | Discharge status: Home / Self Care | Payer: Managed Care, Other (non HMO) | Source: Ambulatory Visit | Attending: Family Medicine | Admitting: Family Medicine

## 2012-01-18 DIAGNOSIS — S40019A Contusion of unspecified shoulder, initial encounter: Secondary | ICD-10-CM | POA: Insufficient documentation

## 2012-01-18 DIAGNOSIS — S4292XA Fracture of left shoulder girdle, part unspecified, initial encounter for closed fracture: Secondary | ICD-10-CM

## 2012-01-18 DIAGNOSIS — W010XXA Fall on same level from slipping, tripping and stumbling without subsequent striking against object, initial encounter: Secondary | ICD-10-CM | POA: Insufficient documentation

## 2012-01-18 DIAGNOSIS — M25519 Pain in unspecified shoulder: Secondary | ICD-10-CM | POA: Insufficient documentation

## 2012-01-23 ENCOUNTER — Encounter: Payer: Self-pay | Admitting: *Deleted

## 2012-01-23 ENCOUNTER — Encounter: Payer: Self-pay | Admitting: Family Medicine

## 2012-01-23 ENCOUNTER — Ambulatory Visit (INDEPENDENT_AMBULATORY_CARE_PROVIDER_SITE_OTHER): Payer: Managed Care, Other (non HMO) | Admitting: Family Medicine

## 2012-01-23 DIAGNOSIS — S4350XA Sprain of unspecified acromioclavicular joint, initial encounter: Secondary | ICD-10-CM | POA: Insufficient documentation

## 2012-01-23 DIAGNOSIS — S46819A Strain of other muscles, fascia and tendons at shoulder and upper arm level, unspecified arm, initial encounter: Secondary | ICD-10-CM

## 2012-01-23 NOTE — Progress Notes (Signed)
OFFICE NOTE  01/23/2012  CC:  Chief Complaint  Patient presents with  . Follow-up    shoulder     HPI: Patient is a 51 y.o. Caucasian male who is here for 7d f/u left shoulder injury. Recent x-ray showed suggestion of possible glenoid fx + bone chip.  F/u MRI did not confirm this but did show AC joint sprain, deltoid contusion, and small partial insertional tear of the supraspinatus tendon on the left.  Conservative treatment recommended: he has been wearing a sling but also doing some gentle active ROM exercises of left shoulder plus some gentle resistance ROM with theraband.  Pain 5/10 upon awakening, goes down to 3/10 with taking vicodin bid.  Ice helps.   Reports home  bp measurements are all normal (wrist cuff). ROS: no paresthesias of left arm, no left arm weakness, no radiation of pain down left arm.  Pertinent PMH:  Past Medical History  Diagnosis Date  . Low back pain 04/2011    Plain film 04/2011: spondylosis/old osteophyte vs endplate fx L3  . Abnormal EKG   . Heart murmur     ECHO 07/2011 showed mild AS and LVH.  TEE 09/2011 showed small PFO, bicuspid aortic valve but no stenosis or regurg.  [name] Hypertension 06/2011  . Hematuria 07/27/2011, 08/25/11    +infection-related  . UTI (lower urinary tract infection) 07/2011    MSSA  . Diabetes mellitus     Diabetic retinopathy and peripheral neuropathy.  . 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  Gen surg and GI recommended flagyl and cipro x 2 wks..  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  . Cholelithiasis 09/2011  . Pyelonephritis 09/2011    MSSA (+bacteremia); iv ancef via HH x 4 wks  . Alcoholism   . Peripheral neuropathy 2012    Diabetic:  Autonomic  (pelvic) and LE polyneuropathy--[facility] testing showed that it is likely from DM    MEDS:  Outpatient Prescriptions Prior to Visit  Medication Sig Dispense Refill  . acetaminophen (TYLENOL) 500 MG tablet Take 500 mg by mouth every 8 (eight) hours as needed.        . ferrous sulfate 325 (65 FE) MG tablet Take 325 mg by mouth daily.        . furosemide (LASIX) 40 MG tablet Take 1 tablet (40 mg total) by mouth daily.  90 tablet  1  . glucagon 1 MG injection Follow package directions for low blood sugar (for emergency use for symptomatic hypoglycemia)  1 each  1  . glucose blood (FREESTYLE LITE) test strip PRN  32 each  12  . HYDROcodone-acetaminophen (VICODIN) 5-500 MG per tablet 1-2 tabs po q6h prn pain  30 tablet  1  . insulin aspart (NOVOLOG FLEXPEN) 100 UNIT/ML injection Base of 10 qAC BF, 12 qAC lunch, and 12 qAC supper plus sliding scale  15 pen  1  . insulin glargine (LANTUS SOLOSTAR) 100 UNIT/ML injection Inject 44 Units into the skin at bedtime.  45 mL  1  . Insulin Pen Needle 32G X 6 MM MISC Use with insulin pen for 4 injections daily  120 each  10  . Lancets (FREESTYLE) lancets PRN  100 each  5  .  lisinopril (PRINIVIL,ZESTRIL) 2.5 MG tablet Take 1 tablet (2.5 mg total) by mouth daily.  90 tablet  1  . Multiple Vitamins-Minerals (MULTI FOR HIM) PACK Take 1 each by mouth daily.        . potassium chloride SA (K-DUR,KLOR-CON) 20 MEQ tablet Take 1 tablet (20 mEq total) by mouth daily.  90 tablet  1  . spironolactone (ALDACTONE) 50 MG tablet Take 1 tablet (50 mg total) by mouth daily.  90 tablet  1  . Elastic Bandages & Supports (MEDICAL COMPRESSION STOCKINGS) MISC 1 Units by Does not apply route daily as needed. Apply to lower extremities daily as needed for lower extremity swelling  1 each  3    PE: Blood pressure 160/84, pulse 71, temperature 97.3 F (36.3 C), temperature source Temporal, height 5\' 9"  (1.753 m), weight 207 lb (93.895 kg). Gen: Alert, well appearing.  Patient is oriented  to person, place, time, and situation. Neck: nontender.  Upper back and shoulders: no bruising or swelling.  TTP over left AC joint. Left shoulder with pain with active ROM primarily in abduction and external rotation. UE strength 5/5 bilat, Trace biceps, triceps, and brachioradialis reflexes bilat.  IMPRESSION AND PLAN: Left shoulder injury: traumatic AC joint sprain and traumatic partial supraspinatus tendon tear. Doing fair on conservative treatment but needs to get into formal PT program so I've ordered this today. No lifting, and therefore no work, for 2 more weeks.  I'll continue current meds (also, I've ordered topical anti-inflammitory compound with diclofenac, neurontin, bupivicaine, and baclofen--still waiting on compounding pharmacy to get in tough with him about this) and recheck him again in 2 wks to gauge progress/readiness to return to work. If significant improvement not noted by that time, radiologist recommended left shoulder MR arthrogram to further clarify some of the vague abnormalities in the left shoulder glenoid area/labrum.  FOLLOW UP: 2 wks.

## 2012-01-25 ENCOUNTER — Ambulatory Visit: Payer: Managed Care, Other (non HMO) | Attending: Family Medicine | Admitting: Physical Therapy

## 2012-01-25 DIAGNOSIS — W010XXA Fall on same level from slipping, tripping and stumbling without subsequent striking against object, initial encounter: Secondary | ICD-10-CM | POA: Insufficient documentation

## 2012-01-25 DIAGNOSIS — IMO0001 Reserved for inherently not codable concepts without codable children: Secondary | ICD-10-CM | POA: Insufficient documentation

## 2012-01-25 DIAGNOSIS — M6281 Muscle weakness (generalized): Secondary | ICD-10-CM | POA: Insufficient documentation

## 2012-01-25 DIAGNOSIS — M545 Low back pain, unspecified: Secondary | ICD-10-CM | POA: Insufficient documentation

## 2012-01-31 ENCOUNTER — Ambulatory Visit: Payer: Managed Care, Other (non HMO) | Admitting: Rehabilitation

## 2012-02-02 ENCOUNTER — Ambulatory Visit: Payer: Managed Care, Other (non HMO) | Admitting: Rehabilitation

## 2012-02-06 ENCOUNTER — Ambulatory Visit (INDEPENDENT_AMBULATORY_CARE_PROVIDER_SITE_OTHER): Payer: Managed Care, Other (non HMO) | Admitting: Family Medicine

## 2012-02-06 ENCOUNTER — Encounter: Payer: Self-pay | Admitting: Family Medicine

## 2012-02-06 DIAGNOSIS — D696 Thrombocytopenia, unspecified: Secondary | ICD-10-CM

## 2012-02-06 DIAGNOSIS — S46819A Strain of other muscles, fascia and tendons at shoulder and upper arm level, unspecified arm, initial encounter: Secondary | ICD-10-CM

## 2012-02-06 DIAGNOSIS — S4350XA Sprain of unspecified acromioclavicular joint, initial encounter: Secondary | ICD-10-CM

## 2012-02-06 DIAGNOSIS — K7460 Unspecified cirrhosis of liver: Secondary | ICD-10-CM

## 2012-02-06 LAB — CBC WITH DIFFERENTIAL/PLATELET
Basophils Relative: 1 % (ref 0.0–3.0)
Eosinophils Relative: 2.2 % (ref 0.0–5.0)
HCT: 37.6 % — ABNORMAL LOW (ref 39.0–52.0)
Hemoglobin: 12.8 g/dL — ABNORMAL LOW (ref 13.0–17.0)
MCV: 90.1 fl (ref 78.0–100.0)
Monocytes Absolute: 0.4 10*3/uL (ref 0.1–1.0)
Neutro Abs: 3.2 10*3/uL (ref 1.4–7.7)
Neutrophils Relative %: 69.2 % (ref 43.0–77.0)
RBC: 4.17 Mil/uL — ABNORMAL LOW (ref 4.22–5.81)
WBC: 4.6 10*3/uL (ref 4.5–10.5)

## 2012-02-06 LAB — COMPREHENSIVE METABOLIC PANEL
Albumin: 3.8 g/dL (ref 3.5–5.2)
Alkaline Phosphatase: 127 U/L — ABNORMAL HIGH (ref 39–117)
BUN: 17 mg/dL (ref 6–23)
Calcium: 9.6 mg/dL (ref 8.4–10.5)
Glucose, Bld: 122 mg/dL — ABNORMAL HIGH (ref 70–99)
Potassium: 4 mEq/L (ref 3.5–5.1)

## 2012-02-06 NOTE — Progress Notes (Signed)
OFFICE NOTE  02/06/2012  CC:  Chief Complaint  Patient presents with  . Follow-up    shoulder     HPI: Patient is a 51 y.o. Caucasian male who is here for 3 wk f/u for left shoulder injury. Shoulders feel better: describes some deep ache, esp in mornings but strength good and doing PT. Worse when cold/morning: 5/10 intensity.  Keeping shoulder mobile seems to keep the pain away some. If sitting still doing nothing he notices the soreness more.  Takes vicodin hs mostly. Stressing out b/c of not being able to work, frustrated with [employer], etc. Queasy this am, he thinks it's related to this stress.  CBG this AM was 94.  Pertinent PMH:  Past Medical History  Diagnosis Date  . Low back pain 04/2011    Plain film 04/2011: spondylosis/old osteophyte vs endplate fx L3  . Abnormal EKG   . Heart murmur     ECHO 07/2011 showed mild AS and LVH.  TEE 09/2011 showed small PFO, bicuspid aortic valve but no stenosis or regurg.  [name] Hypertension 06/2011  . Hematuria 07/27/2011, 08/25/11    +infection-related  . UTI (lower urinary tract infection) 07/2011    MSSA  . Diabetes mellitus     Diabetic retinopathy and peripheral neuropathy.  . 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  Gen surg and GI recommended flagyl and cipro x 2 wks..  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  . Cholelithiasis 09/2011  . Pyelonephritis 09/2011    MSSA (+bacteremia); iv ancef via HH x 4 wks  . Alcoholism   . Peripheral neuropathy 2012    Diabetic:  Autonomic (pelvic) and LE polyneuropathy--[facility] testing showed that it is likely from DM   Past surgical, social, and family history reviewed and no changes noted since last office visit.  MEDS:    Outpatient Prescriptions Prior to Visit  Medication Sig Dispense Refill  . acetaminophen (TYLENOL) 500 MG tablet Take 500 mg by mouth every 8 (eight) hours as needed.        . ferrous sulfate 325 (65 FE) MG tablet Take 325 mg by mouth daily.        . furosemide (LASIX) 40 MG tablet Take 1 tablet (40 mg total) by mouth daily.  90 tablet  1  . glucagon 1 MG injection Follow package directions for low blood sugar (for emergency use for symptomatic hypoglycemia)  1 each  1  . glucose blood (FREESTYLE LITE) test strip PRN  32 each  12  . HYDROcodone-acetaminophen (VICODIN) 5-500 MG per tablet 1-2 tabs po q6h prn pain  30 tablet  1  . insulin aspart (NOVOLOG FLEXPEN) 100 UNIT/ML injection Base of 10 qAC BF, 12 qAC lunch, and 12 qAC supper plus sliding scale  15 pen  1  . insulin glargine (LANTUS SOLOSTAR) 100 UNIT/ML injection Inject 44 Units into the skin at bedtime.  45 mL  1  . Insulin Pen Needle 32G X 6 MM MISC Use with insulin pen for 4 injections daily  120 each  10  . Lancets (FREESTYLE) lancets PRN  100 each  5  . lisinopril (PRINIVIL,ZESTRIL) 2.5 MG tablet Take 1 tablet (2.5 mg total) by mouth daily.  90 tablet  1  . Multiple Vitamins-Minerals (MULTI FOR HIM) PACK Take 1 each by mouth daily.        . potassium chloride SA (K-DUR,KLOR-CON) 20 MEQ tablet Take 1 tablet (20 mEq total) by mouth daily.  90 tablet  1  . spironolactone (ALDACTONE) 50 MG tablet Take 1 tablet (50 mg total) by mouth daily.  90 tablet  1  . Elastic Bandages & Supports (MEDICAL COMPRESSION STOCKINGS) MISC 1 Units by Does not apply route daily as needed. Apply to lower extremities daily as needed for lower extremity swelling  1 each  3    PE: Blood pressure 158/92, pulse 76, height 5\' 9"  (1.753 m), weight 211 lb (95.709 kg). Gen: Alert, well appearing.  Patient is oriented to person, place, time, and situation. Mild TTP over left AC joint and around hook of acromion. ROM of shoulder intact, with mild discomfort with ER  and abduction.   Strength 5/5 prox/dist in UE's bilat.  DTRs 1+ biceps and triceps bilat.   CV: RRR, no m/r/g.   LUNGS: CTA bilat, nonlabored resps, good aeration in all lung fields. EXT: trace pitting edema in both LE's  IMPRESSION AND PLAN:    Traumatic tear of supraspinatus tendon Improving, but not yet ready for the lifting required by his work duties. Continue current meds and PT.  Sprain Of Acromioclavicular Joint Improved.   Continue PT and current meds. Re-evaluate in 3 wks to determine if he's ready to return to full work duty or not.  Thrombocytopenia Initial abd u/s 06/2011 showed no liver or spleen abnormality, so I had planned for him to see Hem Onc when he was willing/able. 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 Repeat CBC and CMET today.     FOLLOW UP: 3 wks

## 2012-02-06 NOTE — Assessment & Plan Note (Signed)
Improving, but not yet ready for the lifting required by his work duties. Continue current meds and PT.

## 2012-02-06 NOTE — Assessment & Plan Note (Signed)
Improved.   Continue PT and current meds. Re-evaluate in 3 wks to determine if he's ready to return to full work duty or not.

## 2012-02-06 NOTE — Assessment & Plan Note (Signed)
Initial abd u/s 06/2011 showed no liver or spleen abnormality, so I had planned for him to see Hem Onc when he was willing/able. 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 Repeat CBC and CMET today.

## 2012-02-07 ENCOUNTER — Ambulatory Visit: Payer: Managed Care, Other (non HMO) | Attending: Family Medicine | Admitting: Rehabilitation

## 2012-02-07 DIAGNOSIS — M545 Low back pain, unspecified: Secondary | ICD-10-CM | POA: Insufficient documentation

## 2012-02-07 DIAGNOSIS — IMO0001 Reserved for inherently not codable concepts without codable children: Secondary | ICD-10-CM | POA: Insufficient documentation

## 2012-02-07 DIAGNOSIS — M6281 Muscle weakness (generalized): Secondary | ICD-10-CM | POA: Insufficient documentation

## 2012-02-09 ENCOUNTER — Ambulatory Visit: Payer: Managed Care, Other (non HMO) | Admitting: Physical Therapy

## 2012-02-14 ENCOUNTER — Ambulatory Visit: Payer: Managed Care, Other (non HMO) | Admitting: Rehabilitation

## 2012-02-16 ENCOUNTER — Ambulatory Visit: Payer: Managed Care, Other (non HMO) | Admitting: Rehabilitation

## 2012-02-21 ENCOUNTER — Ambulatory Visit: Payer: Managed Care, Other (non HMO) | Attending: Family Medicine | Admitting: Physical Therapy

## 2012-02-21 DIAGNOSIS — M545 Low back pain, unspecified: Secondary | ICD-10-CM | POA: Insufficient documentation

## 2012-02-21 DIAGNOSIS — M6281 Muscle weakness (generalized): Secondary | ICD-10-CM | POA: Insufficient documentation

## 2012-02-21 DIAGNOSIS — IMO0001 Reserved for inherently not codable concepts without codable children: Secondary | ICD-10-CM | POA: Insufficient documentation

## 2012-02-23 ENCOUNTER — Ambulatory Visit: Payer: Managed Care, Other (non HMO) | Admitting: Rehabilitation

## 2012-02-27 ENCOUNTER — Ambulatory Visit (INDEPENDENT_AMBULATORY_CARE_PROVIDER_SITE_OTHER): Payer: Managed Care, Other (non HMO) | Admitting: Family Medicine

## 2012-02-27 ENCOUNTER — Encounter: Payer: Self-pay | Admitting: Family Medicine

## 2012-02-27 DIAGNOSIS — S4350XA Sprain of unspecified acromioclavicular joint, initial encounter: Secondary | ICD-10-CM

## 2012-02-27 DIAGNOSIS — S46819A Strain of other muscles, fascia and tendons at shoulder and upper arm level, unspecified arm, initial encounter: Secondary | ICD-10-CM

## 2012-02-27 NOTE — Assessment & Plan Note (Signed)
With left AC joint sprain. Improved quite a bit with therapy over the last 1 and 1/2 months.  Takes rare vicodin. I think it is time to try to return to work without restrictions and see how he does. Letter written today releasing him immediately, no restrictions.

## 2012-02-27 NOTE — Progress Notes (Signed)
OFFICE NOTE  02/27/2012  CC:  Chief Complaint  Patient presents with  . Follow-up    shoulder injury,      HPI: Patient is a 51 y.o. Caucasian male who is here for 3+ wk f/u of left shoulder injury. It has been about 6 1/2 wks now since injury.  Has been doing PT, most recently 2 times per week, says it is feeling better.  Strength feels okay/normal, but still with discomfort with ER and abduction (esp against resistance).  His job would require lifting heavy objects repeatedly. Still requiring vicodin at night b/c rolling onto left shoulder causes brief twinges of excessive pain. He DOES want to return to work now without any restrictions.  Pertinent PMH:  Past Medical History  Diagnosis Date  . Low back pain 04/2011    Plain film 04/2011: spondylosis/old osteophyte vs endplate fx L3  . Abnormal EKG   . Heart murmur     ECHO 07/2011 showed mild AS and LVH.  TEE 09/2011 showed small PFO, bicuspid aortic valve but no stenosis or regurg.  [name] Hypertension 06/2011  . Hematuria 07/27/2011, 08/25/11    +infection-related  . UTI (lower urinary tract infection) 07/2011    MSSA  . Diabetes mellitus     Diabetic retinopathy and peripheral neuropathy.  . 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  Gen surg and GI recommended flagyl and cipro x 2 wks..  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  . Cholelithiasis 09/2011  . Pyelonephritis 09/2011    MSSA (+bacteremia); iv ancef via HH x 4 wks  . Alcoholism   . Peripheral neuropathy 2012    Diabetic:  Autonomic (pelvic) and LE polyneuropathy--[facility] testing showed that it is likely from DM   Past surgical, social, and family history reviewed and no changes noted since last office  visit.  MEDS:  Outpatient Prescriptions Prior to Visit  Medication Sig Dispense Refill  . Elastic Bandages & Supports (MEDICAL COMPRESSION STOCKINGS) MISC 1 Units by Does not apply route daily as needed. Apply to lower extremities daily as needed for lower extremity swelling  1 each  3  . ferrous sulfate 325 (65 FE) MG tablet Take 325 mg by mouth daily.        . furosemide (LASIX) 40 MG tablet Take 1 tablet (40 mg total) by mouth daily.  90 tablet  1  . glucagon 1 MG injection Follow package directions for low blood sugar (for emergency use for symptomatic hypoglycemia)  1 each  1  . glucose blood (FREESTYLE LITE) test strip PRN  32 each  12  . HYDROcodone-acetaminophen (VICODIN) 5-500 MG per tablet 1-2 tabs po q6h prn pain  30 tablet  1  . insulin aspart (NOVOLOG FLEXPEN) 100 UNIT/ML injection Base of 10 qAC BF, 12 qAC lunch, and 12 qAC supper plus sliding scale  15 pen  1  . insulin glargine (LANTUS SOLOSTAR) 100 UNIT/ML injection Inject 44 Units into the skin at bedtime.  45 mL  1  . Insulin Pen Needle 32G X 6 MM MISC Use with insulin pen for 4 injections daily  120 each  10  . Lancets (FREESTYLE) lancets PRN  100 each  5  . lisinopril (PRINIVIL,ZESTRIL) 2.5  MG tablet Take 1 tablet (2.5 mg total) by mouth daily.  90 tablet  1  . Multiple Vitamins-Minerals (MULTI FOR HIM) PACK Take 1 each by mouth daily.        . potassium chloride SA (K-DUR,KLOR-CON) 20 MEQ tablet Take 1 tablet (20 mEq total) by mouth daily.  90 tablet  1  . spironolactone (ALDACTONE) 50 MG tablet Take 1 tablet (50 mg total) by mouth daily.  90 tablet  1  . acetaminophen (TYLENOL) 500 MG tablet Take 500 mg by mouth every 8 (eight) hours as needed.          PE: Blood pressure 136/86, pulse 86, temperature 98 F (36.7 C), temperature source Temporal, weight 211 lb (95.709 kg). Gen: Alert, well appearing.  Patient is oriented to person, place, time, and situation. Mild point tenderness over left AC joint. ROM of left  shoulder fully intact, with mild pain with ER and abduction actively, esp against resistance.   Strength 5/5 prox and dist.  UE DTRs 1+, symmetric.  IMPRESSION AND PLAN: Traumatic tear of supraspinatus tendon With left AC joint sprain. Improved quite a bit with therapy over the last 1 and 1/2 months.  Takes rare vicodin. I think it is time to try to return to work without restrictions and see how he does. Letter written today releasing him immediately, no restrictions.       FOLLOW UP:  Has app already scheduled for 04/04/12--routine chronic illness f/u.

## 2012-03-05 ENCOUNTER — Ambulatory Visit: Payer: Managed Care, Other (non HMO) | Admitting: Rehabilitation

## 2012-03-08 ENCOUNTER — Ambulatory Visit: Payer: Managed Care, Other (non HMO) | Admitting: Physical Therapy

## 2012-03-13 ENCOUNTER — Ambulatory Visit: Payer: Managed Care, Other (non HMO)

## 2012-03-15 ENCOUNTER — Ambulatory Visit: Payer: Managed Care, Other (non HMO) | Admitting: Rehabilitation

## 2012-03-19 ENCOUNTER — Ambulatory Visit: Payer: Managed Care, Other (non HMO) | Attending: Family Medicine | Admitting: Physical Therapy

## 2012-03-19 DIAGNOSIS — M545 Low back pain, unspecified: Secondary | ICD-10-CM | POA: Insufficient documentation

## 2012-03-19 DIAGNOSIS — M6281 Muscle weakness (generalized): Secondary | ICD-10-CM | POA: Insufficient documentation

## 2012-03-19 DIAGNOSIS — IMO0001 Reserved for inherently not codable concepts without codable children: Secondary | ICD-10-CM | POA: Insufficient documentation

## 2012-03-21 ENCOUNTER — Ambulatory Visit: Payer: Managed Care, Other (non HMO) | Admitting: Rehabilitation

## 2012-03-26 ENCOUNTER — Ambulatory Visit: Payer: Managed Care, Other (non HMO) | Admitting: Physical Therapy

## 2012-03-28 ENCOUNTER — Encounter: Payer: Managed Care, Other (non HMO) | Admitting: Rehabilitation

## 2012-04-04 ENCOUNTER — Ambulatory Visit (INDEPENDENT_AMBULATORY_CARE_PROVIDER_SITE_OTHER): Payer: Managed Care, Other (non HMO) | Admitting: Family Medicine

## 2012-04-04 ENCOUNTER — Encounter: Payer: Self-pay | Admitting: Family Medicine

## 2012-04-04 VITALS — BP 127/82 | HR 75 | Temp 97.7°F | Ht 69.0 in | Wt 217.0 lb

## 2012-04-04 DIAGNOSIS — S46819A Strain of other muscles, fascia and tendons at shoulder and upper arm level, unspecified arm, initial encounter: Secondary | ICD-10-CM

## 2012-04-04 DIAGNOSIS — G2581 Restless legs syndrome: Secondary | ICD-10-CM

## 2012-04-04 DIAGNOSIS — E119 Type 2 diabetes mellitus without complications: Secondary | ICD-10-CM

## 2012-04-04 DIAGNOSIS — D500 Iron deficiency anemia secondary to blood loss (chronic): Secondary | ICD-10-CM

## 2012-04-04 DIAGNOSIS — K7460 Unspecified cirrhosis of liver: Secondary | ICD-10-CM

## 2012-04-04 DIAGNOSIS — E1165 Type 2 diabetes mellitus with hyperglycemia: Secondary | ICD-10-CM

## 2012-04-04 DIAGNOSIS — E1169 Type 2 diabetes mellitus with other specified complication: Secondary | ICD-10-CM

## 2012-04-04 NOTE — Progress Notes (Signed)
OFFICE VISIT  04/05/2012   CC:  Chief Complaint  Patient presents with  . Follow-up    shoulder, DM, Cirrhosis     HPI:    Patient is a 51 y.o. Caucasian male who presents for f/u left shoulder injury, DM 2, and cirrhosis with portal HTN/ascites. Shoulder with only occasional fleeting pain with certain movements, and this does not impair him any. He has been ready to work but his employer is waiting to let him come back right now--so he sits at home and gets paid currently.  He takes no oral or topical meds for his shoulder at this time.  Glucose range 85-150: pt can't really get specific as to whether or not this includes only fastings or if PP's are included in this range, too.   No LE weakness, no bowel or bladder issues. He describes restless legs with some typical sensory complaints in LEs (some tingling/creepy-crawling/numbness--all fleeting/intermittent).  This has been getting worse and impairing sleep more.  It is only present when he is at rest or trying to go to sleep, and it is resolved when he gets up and walks around.  ROS: no blood in stool, no melena, no abd pain.  Appetite is good. Past Medical History  Diagnosis Date  . Low back pain 04/2011    Plain film 04/2011: spondylosis/old osteophyte vs endplate fx L3  . Abnormal EKG   . Heart murmur     ECHO 07/2011 showed mild AS and LVH.  TEE 09/2011 showed small PFO, bicuspid aortic valve but no stenosis or regurg.  [name] Hypertension 06/2011  . Hematuria 07/27/2011, 08/25/11    +infection-related  . UTI (lower urinary tract infection) 07/2011    MSSA  . Diabetes mellitus     Diabetic retinopathy and peripheral neuropathy.  . 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  Gen surg and GI recommended flagyl and cipro x 2 wks..  Follow up flex  sig by Dr. 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  . Cholelithiasis 09/2011  . Pyelonephritis 09/2011    MSSA (+bacteremia); iv ancef via HH x 4 wks  . Alcoholism   . Peripheral neuropathy 2012    Diabetic:  Autonomic (pelvic) and LE polyneuropathy--[facility] testing showed that it is likely from DM    Past Surgical History  Procedure Date  . Knee arthroscopy     Bilateral    Outpatient Prescriptions Prior to Visit  Medication Sig Dispense Refill  . acetaminophen (TYLENOL) 500 MG tablet Take 500 mg by mouth every 8 (eight) hours as needed.        . ferrous sulfate 325 (65 FE) MG tablet Take 325 mg by mouth daily.       . furosemide (LASIX) 40 MG tablet Take 1 tablet (40 mg total) by mouth daily.  90 tablet  1  . glucagon 1 MG injection Follow package directions for low blood sugar (for emergency use for symptomatic hypoglycemia)  1 each  1  . glucose blood (FREESTYLE LITE) test strip PRN  32 each  12  . HYDROcodone-acetaminophen (VICODIN) 5-500 MG per tablet 1-2 tabs po q6h prn pain  30 tablet  1  . insulin aspart (NOVOLOG FLEXPEN) 100 UNIT/ML injection Base of 10 qAC BF, 12 qAC lunch,  and 12 qAC supper plus sliding scale  15 pen  1  . insulin glargine (LANTUS SOLOSTAR) 100 UNIT/ML injection Inject 44 Units into the skin at bedtime.  45 mL  1  . Insulin Pen Needle 32G X 6 MM MISC Use with insulin pen for 4 injections daily  120 each  10  . Lancets (FREESTYLE) lancets PRN  100 each  5  . lisinopril (PRINIVIL,ZESTRIL) 2.5 MG tablet Take 1 tablet (2.5 mg total) by mouth daily.  90 tablet  1  . Multiple Vitamins-Minerals (MULTI FOR HIM) PACK Take 1 each by mouth daily.        . potassium chloride SA (K-DUR,KLOR-CON) 20 MEQ tablet Take 1 tablet (20 mEq total) by mouth daily.  90 tablet  1  . spironolactone (ALDACTONE) 50 MG tablet Take 1 tablet (50 mg total) by mouth daily.  90 tablet  1  . Elastic Bandages & Supports (MEDICAL  COMPRESSION STOCKINGS) MISC 1 Units by Does not apply route daily as needed. Apply to lower extremities daily as needed for lower extremity swelling  1 each  3    Allergies  Allergen Reactions  . Sulfa Antibiotics   . Naproxen Nausea Only    ROS As per HPI  PE: Blood pressure 127/82, pulse 75, temperature 97.7 F (36.5 C), temperature source Temporal, height 5\' 9"  (1.753 m), weight 217 lb (98.431 kg). Gen: Alert, well appearing.  Patient is oriented to person, place, time, and situation. No pallor or jaundice. CV: RRR, no m/r/g.   LUNGS: CTA bilat, nonlabored resps, good aeration in all lung fields. ABD: soft, NT, ND, BS normal.  No hepatospenomegaly or mass.  No bruits. EXT: no clubbing or cyanosis.  1+ edema in both lower legs. Left shoulder: nontender.  No pain with ROM except slightly has brief shooting pain with abduction and ER/IR with shoulder near 120 degrees abduction.  No weakness.    LABS:  none  IMPRESSION AND PLAN:  Traumatic tear of supraspinatus tendon Left side, + AC joint sprain. Essentially resolved clinically. He repeats that he is ready to return to full duty at work without restrictions but is awaiting their ok on this as per their safety team.  DM type 2, causing other manifestations, not at goal Control as per home glucose checks sounds fair. Check A1c today.  Cirrhosis With portal HTN, ascites, thrombocytopenia: stable. Ascites appears to be minimal on exam today, LE edema minimal as well. Continue current meds, check CMET.  He continues to abstain from alcohol.   Blood loss anemia Lab Results  Component Value Date   WBC 4.6 02/06/2012   HGB 12.8* 02/06/2012   HCT 37.6* 02/06/2012   MCV 90.1 02/06/2012   PLT 52.0* 02/06/2012   He he'll continue one iron tab daily for a couple more months to completely replenish iron stores.  Restless legs syndrome Discussed treatment options. Offered clonazepam trial.  He recalls a discussion with Dr. Modesto Charon  about some other potential meds (mirapex, etc) and remembers being told they may not be the best for him, at least at that point in time. Decided to have him go back to Dr. Modesto Charon to discuss this issue again since his peripheral neuropathy sx's have calmed down some. Pt agreed with this plan.  No meds for this rx'd today.     FOLLOW UP: Return in about 4 months (around 08/04/2012) for f/u DM 2 and cirrhosis.

## 2012-04-05 ENCOUNTER — Telehealth: Payer: Self-pay | Admitting: *Deleted

## 2012-04-05 DIAGNOSIS — G2581 Restless legs syndrome: Secondary | ICD-10-CM | POA: Insufficient documentation

## 2012-04-05 LAB — HEMOGLOBIN A1C
Hgb A1c MFr Bld: 8.5 % — ABNORMAL HIGH (ref <5.7)
Mean Plasma Glucose: 197 mg/dL — ABNORMAL HIGH (ref <117)

## 2012-04-05 LAB — COMPREHENSIVE METABOLIC PANEL
ALT: 42 U/L (ref 0–53)
AST: 39 U/L — ABNORMAL HIGH (ref 0–37)
Chloride: 107 mEq/L (ref 96–112)
Creat: 0.7 mg/dL (ref 0.50–1.35)
Total Bilirubin: 0.8 mg/dL (ref 0.3–1.2)

## 2012-04-05 MED ORDER — CLONAZEPAM 1 MG PO TABS: ORAL_TABLET | ORAL | Status: DC

## 2012-04-05 NOTE — Assessment & Plan Note (Signed)
Lab Results  Component Value Date   WBC 4.6 02/06/2012   HGB 12.8* 02/06/2012   HCT 37.6* 02/06/2012   MCV 90.1 02/06/2012   PLT 52.0* 02/06/2012   He he'll continue one iron tab daily for a couple more months to completely replenish iron stores.

## 2012-04-05 NOTE — Assessment & Plan Note (Addendum)
Left side, + AC joint sprain. Essentially resolved clinically. He repeats that he is ready to return to full duty at work without restrictions but is awaiting their ok on this as per their safety team.

## 2012-04-05 NOTE — Assessment & Plan Note (Signed)
With portal HTN, ascites, thrombocytopenia: stable. Ascites appears to be minimal on exam today, LE edema minimal as well. Continue current meds, check CMET.  He continues to abstain from alcohol.

## 2012-04-05 NOTE — Telephone Encounter (Signed)
Pt states that his appt to see Dr. Modesto Charon is about 6 weeks out.  Pt would like to know if he should take "pill" you guys talked about at visit until he sees Dr. Modesto Charon and he will discuss further at that time.  Pt does not know name of medication you recommended.  Review of chart shows you offered clonazepam. Please advise.

## 2012-04-05 NOTE — Assessment & Plan Note (Signed)
Discussed treatment options. Offered clonazepam trial.  He recalls a discussion with Dr. Modesto Charon about some other potential meds (mirapex, etc) and remembers being told they may not be the best for him, at least at that point in time. Decided to have him go back to Dr. Modesto Charon to discuss this issue again since his peripheral neuropathy sx's have calmed down some. Pt agreed with this plan.  No meds for this rx'd today.

## 2012-04-05 NOTE — Telephone Encounter (Signed)
Clonazepam rx printed.  Try 1/2 tab qhs for a few nights and if not effective he can take a whole tab at bedtime.-thx

## 2012-04-05 NOTE — Assessment & Plan Note (Signed)
Control as per home glucose checks sounds fair. Check A1c today.

## 2012-04-06 NOTE — Telephone Encounter (Signed)
Pt notified and RX faxed.

## 2012-04-09 NOTE — Progress Notes (Signed)
Addended by: Court Joy on: 04/09/2012 04:00 PM   Modules accepted: Orders

## 2012-05-09 ENCOUNTER — Ambulatory Visit (INDEPENDENT_AMBULATORY_CARE_PROVIDER_SITE_OTHER): Payer: Managed Care, Other (non HMO) | Admitting: Neurology

## 2012-05-09 ENCOUNTER — Encounter: Payer: Self-pay | Admitting: Neurology

## 2012-05-09 VITALS — BP 138/72 | HR 80 | Wt 221.0 lb

## 2012-05-09 DIAGNOSIS — G609 Hereditary and idiopathic neuropathy, unspecified: Secondary | ICD-10-CM

## 2012-05-09 DIAGNOSIS — G629 Polyneuropathy, unspecified: Secondary | ICD-10-CM

## 2012-05-09 MED ORDER — GABAPENTIN 300 MG PO CAPS: ORAL_CAPSULE | ORAL | Status: DC

## 2012-05-09 NOTE — Progress Notes (Signed)
Dear Dr. [name],   I saw Mr. 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 As you may recall he is a 51 year old man with a history of poorly controlled diabetes mellitus who originally presented to you with urinary retention and diarrhea. An MRI of his L spine had been unremarkable but I was worried about a possible demyelinating neuropathy and performed an LP to look for cytoalbuminologic disassociation. His CSF was unremarkable with normal protein as well as no abnormal cells. Incidentally, ESR was elevated in the 60s and CRP was elevated in the 2s. Shortly after that he was hospitalized for a urinary tract infection and staph aureus in his blood. He was diagnosed with cirrhosis and found to have a rectal tear. They removed his urinary catheter while he was in hospital with no problems with urinary retention. He was sent home on IV abx. I felt that his urinary retention was likely non-neurologic as it has improved and was likely due to the local inflammation from his rectal tear.   Because of this diagnosis of autonomic neuropathy(and a concern that perhaps he had an acute lumbosacral plexopathy from his diabetes) I did refer him to [facility]. As far as I can tell they felt his presentation was in keeping with a DSPN related to diabetes.  I unfortunately don't have the results of the EMG/NCS and autonomic testing he received.  Since I last saw him he has done much better.  His diabetes is a bit better controlled and he has stopped drinking.  His main complaints are fatigue and painful prickling in his feet.  This can keep him up at night.  He feels the strength in his upper extremities is normal which was not the case before.  He feels he is still weak in his lower extremities.  He has not had any urinary problems again supporting that he did NOT have an autonomic neuropathy but rather this was due to his local rectal tear.   Incidentally, he did tear his rotator  cuff and was off work for 5 weeks in early January.    You placed him on clonazepam for his difficulty sleeping related to his painful sensations in his legs.  He feels this helps.      Medical history, social history, and family history were reviewed and have not changed since the last clinic visit.  Current Outpatient Prescriptions on File Prior to Visit  Medication Sig Dispense Refill  . acetaminophen (TYLENOL) 500 MG tablet Take 500 mg by mouth every 8 (eight) hours as needed.        . clonazePAM (KLONOPIN) 1 MG tablet 1/2-1 tab qhs for restless legs syndrome  30 tablet  1  . Elastic Bandages & Supports (MEDICAL COMPRESSION STOCKINGS) MISC 1 Units by Does not apply route daily as needed. Apply to lower extremities daily as needed for lower extremity swelling  1 each  3  . ferrous sulfate 325 (65 FE) MG tablet Take 325 mg by mouth daily.       . furosemide (LASIX) 40 MG tablet Take 1 tablet (40 mg total) by mouth daily.  90 tablet  1  . glucagon 1 MG injection Follow package directions for low blood sugar (for emergency use for symptomatic hypoglycemia)  1 each  1  . glucose blood (FREESTYLE LITE) test strip PRN  32 each  12  . insulin aspart (NOVOLOG FLEXPEN) 100 UNIT/ML injection Base of 10 qAC BF, 12 qAC lunch, and  12 qAC supper plus sliding scale  15 pen  1  . insulin glargine (LANTUS) 100 UNIT/ML injection Inject 46 Units into the skin at bedtime.      . Insulin Pen Needle 32G X 6 MM MISC Use with insulin pen for 4 injections daily  120 each  10  . Lancets (FREESTYLE) lancets PRN  100 each  5  . lisinopril (PRINIVIL,ZESTRIL) 2.5 MG tablet Take 1 tablet (2.5 mg total) by mouth daily.  90 tablet  1  . Multiple Vitamins-Minerals (MULTI FOR HIM) PACK Take 1 each by mouth daily.        . potassium chloride SA (K-DUR,KLOR-CON) 20 MEQ tablet Take 1 tablet (20 mEq total) by mouth daily.  90 tablet  1  . spironolactone (ALDACTONE) 50 MG tablet Take 1 tablet (50 mg total) by mouth daily.  90  tablet  1  . gabapentin (NEURONTIN) 300 MG capsule increase to 1 capsule three times per day as directed.  90 capsule  5  . DISCONTD: ferrous sulfate 325 (65 FE) MG tablet Take 1 tablet (325 mg total) by mouth 2 (two) times daily.  30 tablet  11    Allergies  Allergen Reactions  . Sulfa Antibiotics   . Naproxen Nausea Only    ROS:  13 systems were reviewed and remarkable for fatigue in his lower extremities.  Exam: . Filed Vitals:   05/09/12 1102  BP: 138/72  Pulse: 80  Weight: 221 lb (100.245 kg)    In general, obese man in NAD.  Mental status:   The patient is oriented to person, place and time. Recent and remote memory are intact. Attention span and concentration are normal. Language including repetition, naming, following commands are intact. Fund of knowledge of current and historical events, as well as vocabulary are normal.  Cranial Nerves: Pupils are equally round and reactive to light. Visual fields full to confrontation. Extraocular movements are intact without nystagmus. Facial sensation and muscles of mastication are intact. Muscles of facial expression are symmetric. Hearing intact to bilateral finger rub. Tongue protrusion, uvula, palate midline.  Shoulder shrug intact  Motor:  Decreased bulk distally.  Normal strength throughout except extension of his great toe bilaterally, worse on the left.  Reflexes:  Absent throughout.  Sensory:  Has normal vibration sense and temperature sense in upper extremities.  Lower extremities, decrease temperature to ankle, and vibration decreased but not absent in toes.  Position slightly impaired.  Gait:  Mildly wide based gait.  Romberg negative.  Impression/Recommendations:  1.  DSPN - mainly effects the legs.  Likely mainly due to diabetes and possible old effect of alcohol  I have encouraged him to continue to manage his sugars well.  I have offered him gabapentin 300 tid for the prickling pain. 2.  Fatigue - not sure as to  the reason for this.  He is going to follow up with his PCP about this.  We will see the patient back in 3  months.  Lupita Raider Modesto Charon, MD [facility], [place]

## 2012-05-09 NOTE — Patient Instructions (Signed)
Neurontin( generic name gabapentin) 300mg capsules  Take 1 capsule at night for 5 days, then 1 capsule two times per day for 5 days, and then 1 capsule three times a day from then on.  

## 2012-06-14 ENCOUNTER — Encounter: Payer: Self-pay | Admitting: Family Medicine

## 2012-06-14 ENCOUNTER — Telehealth: Payer: Self-pay | Admitting: *Deleted

## 2012-06-14 NOTE — Telephone Encounter (Signed)
Pt needs letter stating that he can work 12 hour shifts with no restrictions.  This needs to be faxed to his HR person--Terry--[phone]

## 2012-06-15 NOTE — Telephone Encounter (Signed)
Patient called back, he said the fax machine is down that we sent the original letter to. He needs it to be faxed to the nurses's fax [phone]. Refaxed through [facility] with note to give to [place] in HR.

## 2012-08-03 ENCOUNTER — Encounter: Payer: Self-pay | Admitting: Family Medicine

## 2012-08-03 ENCOUNTER — Other Ambulatory Visit: Payer: Self-pay | Admitting: Family Medicine

## 2012-08-03 ENCOUNTER — Ambulatory Visit (INDEPENDENT_AMBULATORY_CARE_PROVIDER_SITE_OTHER): Payer: Managed Care, Other (non HMO) | Admitting: Family Medicine

## 2012-08-03 ENCOUNTER — Ambulatory Visit: Payer: Managed Care, Other (non HMO) | Admitting: Family Medicine

## 2012-08-03 VITALS — BP 165/78 | HR 73 | Temp 98.0°F | Ht 69.0 in | Wt 233.1 lb

## 2012-08-03 DIAGNOSIS — E119 Type 2 diabetes mellitus without complications: Secondary | ICD-10-CM

## 2012-08-03 DIAGNOSIS — D696 Thrombocytopenia, unspecified: Secondary | ICD-10-CM

## 2012-08-03 DIAGNOSIS — E1169 Type 2 diabetes mellitus with other specified complication: Secondary | ICD-10-CM

## 2012-08-03 DIAGNOSIS — K7460 Unspecified cirrhosis of liver: Secondary | ICD-10-CM

## 2012-08-03 DIAGNOSIS — N529 Male erectile dysfunction, unspecified: Secondary | ICD-10-CM | POA: Insufficient documentation

## 2012-08-03 DIAGNOSIS — I10 Essential (primary) hypertension: Secondary | ICD-10-CM

## 2012-08-03 MED ORDER — INSULIN ASPART 100 UNIT/ML ~~LOC~~ SOLN: SUBCUTANEOUS | Status: DC

## 2012-08-03 MED ORDER — INSULIN GLARGINE 100 UNIT/ML ~~LOC~~ SOLN: 46.0000 [IU] | Freq: Every day | SUBCUTANEOUS | Status: DC

## 2012-08-03 NOTE — Assessment & Plan Note (Signed)
He reports decent blood sugars in one sentence and then in another sentence he's talking about how they are in the 200s "alot". He's always been pretty vague about his home monitoring results. Will check HbA1c today. Foot exam today showed a slight amount of decreased sensation in both feet supporting dx of DPN. He'll continue neurontin tid.  He has f/u with Dr. Modesto Charon soon regarding his history of problems with peripheral neuropathy.

## 2012-08-03 NOTE — Assessment & Plan Note (Signed)
Stable as per home bp reports. Continue current meds. Check lytes/cr today.

## 2012-08-03 NOTE — Assessment & Plan Note (Signed)
With hx of portal HTN, splenomegaly, and ascites. Stable.  Continue current meds. Check CMET, CBC today.  Plan on doing a repeat abd u/s after next f/u to monitor liver and splenomegaly.

## 2012-08-03 NOTE — Telephone Encounter (Signed)
completed

## 2012-08-03 NOTE — Progress Notes (Signed)
OFFICE VISIT  08/03/2012   CC:  Chief Complaint  Patient presents with  . Follow-up    4 month for DM2 and cirrhosis     HPI:    Patient is a 51 y.o. Caucasian male who presents for 4 mo f/u. C/o complete erectile dysfunction since getting out of the hospital months ago.  Energy down after about 8 hours of his 12 hour shift. Glucose fasting was 86 this morning.  Had an episode of sympt hypoglycemia the other night (48).  He brought this up with grape juice and raisin bran.  Worst number he recalls is 279.  Usually under 200, fastings roughly between 98 and 106 fasting in AM. Mealtime insulin regimen 09-30-11 + sliding scale.  Lanuts 42 U qhs. His diet is ok for diabetes sometimes, but due to schedule it is sometimes not good. BP monitoring at home has been ok: he can't recall specific numbers.   Says recent spirometry at work were ?OK?[name] Back feels terrible when sitting: muscles tighten up, no radiation.  Describes tingling sensations briefly in legs when he tries to go to sleep, legs do a quick flinch, but no pain/tingling/numbness in feet.  He takes tylenol maybe once per week. He has f/u with Dr. Modesto Charon next week.  Past Medical History  Diagnosis Date  . Low back pain 04/2011    Plain film 04/2011: spondylosis/old osteophyte vs endplate fx L3  . Abnormal EKG   . Heart murmur     ECHO 07/2011 showed mild AS and LVH.  TEE 09/2011 showed small PFO, bicuspid aortic valve but no stenosis or regurg.  [name] Hypertension 06/2011  . Hematuria 07/27/2011, 08/25/11    +infection-related  . UTI (lower urinary tract infection) 07/2011    MSSA  . Diabetes mellitus     Diabetic retinopathy and peripheral neuropathy.  . 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  Gen surg and GI recommended flagyl and cipro x 2  wks..  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  . Cholelithiasis 09/2011  . Pyelonephritis 09/2011    MSSA (+bacteremia); iv ancef via HH x 4 wks  . Alcoholism   . Peripheral neuropathy 2012    Diabetic:  Autonomic (pelvic) and LE polyneuropathy--[facility] testing showed that it is likely from DM    Past Surgical History  Procedure Date  . Knee arthroscopy     Bilateral    Outpatient Prescriptions Prior to Visit  Medication Sig Dispense Refill  . acetaminophen (TYLENOL) 500 MG tablet Take 500 mg by mouth every 8 (eight) hours as needed.        . clonazePAM (KLONOPIN) 1 MG tablet 1/2-1 tab qhs for restless legs syndrome  30 tablet  1  . Elastic Bandages & Supports (MEDICAL COMPRESSION STOCKINGS) MISC 1 Units by Does not apply route daily as needed. Apply to lower extremities daily as needed for lower extremity swelling  1 each  3  . ferrous sulfate 325 (65 FE) MG tablet Take 325 mg by mouth daily.       . furosemide (LASIX) 40 MG tablet Take 1 tablet (40 mg total) by mouth daily.  90 tablet  1  . gabapentin (NEURONTIN) 300 MG capsule increase to 1 capsule  three times per day as directed.  90 capsule  5  . glucagon 1 MG injection Follow package directions for low blood sugar (for emergency use for symptomatic hypoglycemia)  1 each  1  . Insulin Pen Needle 32G X 6 MM MISC Use with insulin pen for 4 injections daily  120 each  10  . lisinopril (PRINIVIL,ZESTRIL) 2.5 MG tablet Take 1 tablet (2.5 mg total) by mouth daily.  90 tablet  1  . Multiple Vitamins-Minerals (MULTI FOR HIM) PACK Take 1 each by mouth daily.        . potassium chloride SA (K-DUR,KLOR-CON) 20 MEQ tablet Take 1 tablet (20 mEq total) by mouth daily.  90 tablet  1  . spironolactone (ALDACTONE) 50 MG tablet Take 1 tablet (50 mg total) by mouth daily.  90 tablet  1  . insulin aspart (NOVOLOG FLEXPEN) 100 UNIT/ML injection Base of 10 qAC BF, 12 qAC  lunch, and 12 qAC supper plus sliding scale  15 pen  1  . insulin glargine (LANTUS) 100 UNIT/ML injection Inject 46 Units into the skin at bedtime.      [name] glucose blood (FREESTYLE LITE) test strip PRN  32 each  12  . Lancets (FREESTYLE) lancets PRN  100 each  5    Allergies  Allergen Reactions  . Sulfa Antibiotics   . Naproxen Nausea Only    ROS As per HPI  PE: Blood pressure 165/78, pulse 73, temperature 98 F (36.7 C), temperature source Temporal, height 5\' 9"  (1.753 m), weight 233 lb 1.9 oz (105.743 kg), SpO2 98.00%. Gen: Alert, well appearing.  Patient is oriented to person, place, time, and situation. ENT: Ears: EACs clear, normal epithelium.  TMs with good light reflex and landmarks bilaterally.  Eyes: no injection, icteris, swelling, or exudate.  EOMI, PERRLA. Nose: no drainage or turbinate edema/swelling.  No injection or focal lesion.  Mouth: lips without lesion/swelling.  Oral mucosa pink and moist.  Dentition intact and without obvious caries or gingival swelling.  Oropharynx without erythema, exudate, or swelling.  CV: RRR, 1/6 syst murmur at RUSB   LUNGS: CTA bilat, nonlabored resps, good aeration in all lung fields. ABD: rotund, nontender and nondistended.  No HSM or mass.  No bruit.   EXT: no cyanosis or edema. 1-2 pitting edema bilat, flaky hemosiderin changes bilat. FEET: no deformities.  A large callus on each foot, nontender and no discoloration or fluctance present on these areas.  Monofilament testing shows mildly diminished sensation throughout both feet bilat, with nonpalpable PT pulses, and 2+ DP pulses.  Nails diffusely thickened.   LABS:  None today  IMPRESSION AND PLAN:  Cirrhosis With hx of portal HTN, splenomegaly, and ascites. Stable.  Continue current meds. Check CMET, CBC today.  Plan on doing a repeat abd u/s after next f/u to monitor liver and splenomegaly.  DM type 2, causing other manifestations, not at goal He reports decent blood sugars in  one sentence and then in another sentence he's talking about how they are in the 200s "alot". He's always been pretty vague about his home monitoring results. Will check HbA1c today. Foot exam today showed a slight amount of decreased sensation in both feet supporting dx of DPN. He'll continue neurontin tid.  He has f/u with Dr. Modesto Charon soon regarding his history of problems with peripheral neuropathy.  HTN (hypertension), benign Stable as per home bp reports. Continue current meds. Check lytes/cr today.  Erectile dysfunction Check testosterone level today.    FOLLOW  UP: Return in about 4 months (around 12/03/2012) for f/u DM 2, cirrhosis, HTN.

## 2012-08-03 NOTE — Assessment & Plan Note (Signed)
Check testosterone level today.  

## 2012-08-07 ENCOUNTER — Telehealth: Payer: Self-pay | Admitting: Family Medicine

## 2012-08-07 DIAGNOSIS — D696 Thrombocytopenia, unspecified: Secondary | ICD-10-CM

## 2012-08-07 DIAGNOSIS — R161 Splenomegaly, not elsewhere classified: Secondary | ICD-10-CM

## 2012-08-07 DIAGNOSIS — K7460 Unspecified cirrhosis of liver: Secondary | ICD-10-CM

## 2012-08-07 NOTE — Telephone Encounter (Signed)
I have attempted to contact this patient by phone with the following results: left message to return my call on answering machine (home).  

## 2012-08-07 NOTE — Telephone Encounter (Signed)
Pls notify pt that his recent blood work showed that his platelets have dropped a little lower than they have been on recent checks (40K this time, and since 11/2011 they have been in the 50's).  Tell him I'd like him to get a repeat of his abdominal ultrasound to look at his spleen.  I've put in the order for [facility].  All other labs were fine/stable.-thx

## 2012-08-10 ENCOUNTER — Ambulatory Visit (INDEPENDENT_AMBULATORY_CARE_PROVIDER_SITE_OTHER): Payer: Managed Care, Other (non HMO) | Admitting: Neurology

## 2012-08-10 ENCOUNTER — Other Ambulatory Visit: Payer: Self-pay | Admitting: Neurology

## 2012-08-10 ENCOUNTER — Encounter: Payer: Self-pay | Admitting: Neurology

## 2012-08-10 VITALS — BP 138/68 | HR 74 | Wt 238.0 lb

## 2012-08-10 DIAGNOSIS — G629 Polyneuropathy, unspecified: Secondary | ICD-10-CM

## 2012-08-10 DIAGNOSIS — G609 Hereditary and idiopathic neuropathy, unspecified: Secondary | ICD-10-CM

## 2012-08-10 MED ORDER — GABAPENTIN 300 MG PO CAPS: 300.0000 mg | ORAL_CAPSULE | Freq: Three times a day (TID) | ORAL | Status: DC

## 2012-08-10 NOTE — Telephone Encounter (Signed)
Pt notified.  He has appt on 8/27.  Advised we will call him with results.

## 2012-08-10 NOTE — Progress Notes (Signed)
Dear Dr. [name],  I saw  [name] back in [facility] clinic for his problem with diabetic peripheral neuropathy.  As you may recall, he is a 51 y.o. year old male with a history of poorly controlled diabetes mellitus who originally presented to you with urinary retention and diarrhea. An MRI of his L spine had been unremarkable but I was worried about a possible demyelinating neuropathy and performed an LP to look for cytoalbuminologic disassociation. His CSF was unremarkable with normal protein as well as no abnormal cells. Incidentally, ESR was elevated in the 60s and CRP was elevated in the 2s. Shortly after that he was hospitalized for a urinary tract infection and staph aureus in his blood. He was diagnosed with cirrhosis and found to have a rectal tear. They removed his urinary catheter while he was in hospital with no problems with urinary retention. He was sent home on IV abx. I felt that his urinary retention was likely non-neurologic as it has improved and was likely due to the local inflammation from his rectal tear.  Because of this diagnosis of autonomic neuropathy(and a concern that perhaps he had an acute lumbosacral plexopathy from his diabetes) I did refer him to [facility]. As far as I can tell they felt his presentation was in keeping with a DSPN related to diabetes. I unfortunately don't have the results of the EMG/NCS and autonomic testing he received.  He has continued to do relatively well.  He feels his strength is almost back to normal.  He still gets mild burning and tingling in his feet and hands.  Gabapentin 300 tid seems to help this.  He does say that his strength and numbness seems worse in his left leg.  His blood sugars are apparently under better control, but I don't seem a recent HbA1C.   He still says he is refraining from alcohol use.   Medical history, social history, and family history were reviewed and have not changed since the last clinic  visit.  Current Outpatient Prescriptions on File Prior to Visit  Medication Sig Dispense Refill  . acetaminophen (TYLENOL) 500 MG tablet Take 500 mg by mouth every 8 (eight) hours as needed.        . clonazePAM (KLONOPIN) 1 MG tablet 1/2-1 tab qhs for restless legs syndrome  30 tablet  1  . Elastic Bandages & Supports (MEDICAL COMPRESSION STOCKINGS) MISC 1 Units by Does not apply route daily as needed. Apply to lower extremities daily as needed for lower extremity swelling  1 each  3  . ferrous sulfate 325 (65 FE) MG tablet Take 325 mg by mouth daily.       . furosemide (LASIX) 40 MG tablet Take 1 tablet (40 mg total) by mouth daily.  90 tablet  1  . glucagon 1 MG injection Follow package directions for low blood sugar (for emergency use for symptomatic hypoglycemia)  1 each  1  . insulin aspart (NOVOLOG FLEXPEN) 100 UNIT/ML injection Base of 10 qAC BF, 12 qAC lunch, and 12 qAC supper plus sliding scale  15 pen  1  . insulin glargine (LANTUS) 100 UNIT/ML injection Inject 46 Units into the skin at bedtime.  30 mL  1  . Insulin Pen Needle 32G X 6 MM MISC Use with insulin pen for 4 injections daily  120 each  10  . lisinopril (PRINIVIL,ZESTRIL) 2.5 MG tablet Take 1 tablet (2.5 mg total) by mouth daily.  90 tablet  1  .  Multiple Vitamins-Minerals (MULTI FOR HIM) PACK Take 1 each by mouth daily.        . potassium chloride SA (K-DUR,KLOR-CON) 20 MEQ tablet Take 1 tablet (20 mEq total) by mouth daily.  90 tablet  1  . spironolactone (ALDACTONE) 50 MG tablet Take 1 tablet (50 mg total) by mouth daily.  90 tablet  1  . DISCONTD: gabapentin (NEURONTIN) 300 MG capsule increase to 1 capsule three times per day as directed.  90 capsule  5  . glucose blood (FREESTYLE LITE) test strip PRN  32 each  12  . Lancets (FREESTYLE) lancets PRN  100 each  5  . DISCONTD: ferrous sulfate 325 (65 FE) MG tablet Take 1 tablet (325 mg total) by mouth 2 (two) times daily.  30 tablet  11    Allergies  Allergen Reactions    . Sulfa Antibiotics   . Naproxen Nausea Only    ROS:  13 systems were reviewed and are notable for intermittent back pain.  All other review of systems are unremarkable.  Exam: . Filed Vitals:   08/10/12 0930  BP: 138/68  Pulse: 74  Weight: 238 lb (107.956 kg)    In general, obese man in NAD.  Ext:  severe trophic changes in his feet and legs.  Mental status:   The patient is oriented to person, place and time. Recent and remote memory are intact. Attention span and concentration are normal. Language including repetition, naming, following commands are intact. Fund of knowledge of current and historical events, as well as vocabulary are normal.  Cranial Nerves: Pupils are equally round and reactive to light. Visual fields full to confrontation. Extraocular movements are intact without nystagmus. Facial sensation and muscles of mastication are intact. Muscles of facial expression are symmetric. Hearing intact to bilateral finger rub. Tongue protrusion, uvula, palate midline.  Shoulder shrug intact  Motor:  Normal bulk and tone, no drift and 5/5 muscle strength bilaterally, except weak EHL bilaterally at 4+ and mild 5- HF weakness on left.  Reflexes:  Absent throughout.  Coordination:  Normal finger to nose  Sensation: 4 by R-S on left, 7 on right toe.  No clear temp gradient in feet, but one to elbow in arms.  Position in tact.  Gait:  Normal gait and station.   Impression/Recommendations:  1.  Diabetic peripheral neuropathy - It is stable, and if anything his strength has improved.  I think he needs to see a podiatrist for better foot care, and if you can recommend anyone that would be appreciated.  He will remain on gabapentin 300 tid. 2.  Mild left leg hip flexor weakness and asymmetric sensory findings - This could be coming from his back.  I did not reorder an MRI L-spine for now, but if he has worsening leg weakness this would be recommended.  The patient will follow up  with yourself.  Lupita Raider Modesto Charon, MD [facility], [place]

## 2012-08-14 ENCOUNTER — Encounter: Payer: Self-pay | Admitting: Family Medicine

## 2012-08-14 ENCOUNTER — Ambulatory Visit (HOSPITAL_BASED_OUTPATIENT_CLINIC_OR_DEPARTMENT_OTHER)
Admission: RE | Admit: 2012-08-14 | Discharge: 2012-08-14 | Disposition: A | Discharge status: Home / Self Care | Payer: Managed Care, Other (non HMO) | Source: Ambulatory Visit | Attending: Family Medicine | Admitting: Family Medicine

## 2012-08-14 DIAGNOSIS — R161 Splenomegaly, not elsewhere classified: Secondary | ICD-10-CM

## 2012-08-14 DIAGNOSIS — K7460 Unspecified cirrhosis of liver: Secondary | ICD-10-CM

## 2012-08-14 DIAGNOSIS — K8020 Calculus of gallbladder without cholecystitis without obstruction: Secondary | ICD-10-CM | POA: Insufficient documentation

## 2012-08-14 DIAGNOSIS — D696 Thrombocytopenia, unspecified: Secondary | ICD-10-CM

## 2012-08-16 ENCOUNTER — Encounter: Payer: Self-pay | Admitting: Family Medicine

## 2012-09-10 ENCOUNTER — Ambulatory Visit (INDEPENDENT_AMBULATORY_CARE_PROVIDER_SITE_OTHER): Payer: Managed Care, Other (non HMO) | Admitting: Family Medicine

## 2012-09-10 ENCOUNTER — Encounter: Payer: Self-pay | Admitting: Family Medicine

## 2012-09-10 VITALS — BP 144/77 | HR 71 | Ht 69.0 in | Wt 223.0 lb

## 2012-09-10 DIAGNOSIS — M7989 Other specified soft tissue disorders: Secondary | ICD-10-CM

## 2012-09-10 DIAGNOSIS — I959 Hypotension, unspecified: Secondary | ICD-10-CM

## 2012-09-10 DIAGNOSIS — D696 Thrombocytopenia, unspecified: Secondary | ICD-10-CM

## 2012-09-10 DIAGNOSIS — K7460 Unspecified cirrhosis of liver: Secondary | ICD-10-CM

## 2012-09-10 LAB — CBC WITH DIFFERENTIAL/PLATELET
Basophils Absolute: 0 10*3/uL (ref 0.0–0.1)
HCT: 35 % — ABNORMAL LOW (ref 39.0–52.0)
Lymphs Abs: 0.6 10*3/uL — ABNORMAL LOW (ref 0.7–4.0)
MCHC: 33.2 g/dL (ref 30.0–36.0)
MCV: 90 fl (ref 78.0–100.0)
Monocytes Absolute: 0.4 10*3/uL (ref 0.1–1.0)
Platelets: 50 10*3/uL — ABNORMAL LOW (ref 150.0–400.0)
RDW: 12.9 % (ref 11.5–14.6)

## 2012-09-10 LAB — D-DIMER, QUANTITATIVE: D-Dimer, Quant: 0.4 ug/mL-FEU (ref 0.00–0.48)

## 2012-09-10 NOTE — Progress Notes (Addendum)
OFFICE NOTE  09/10/2012  CC:  Chief Complaint  Patient presents with  . Hypotension    BP low at work; blurry vision, couldn't focus; pupils constricted; sugar 197, BP 80/60 & 90/60 by nurse at work; clammy     HPI: Patient is a 51 y.o. Caucasian male who is here for low bp at work today. He had acute onset of feeling clammy, vision was foggy/blurry.   Someone noted his pupils were contracted. His bp check was 90/60.  He had no CP or palpitations.  CBG at nurse's office at work was 197.  Glucose this am-fasting-was 109. He had some diarrhea yesterday after eating at [employer] 3 X, small volume, watery) and took one immodium and it has resolved.  No vomiting. Has taken more tylenol lately, 2 pills every night to help low back pain.  ROS: urinating normally.  No fevers.  No SOB or cough.  Mild abd pain in upper portions.  Pertinent PMH:  Past Medical History  Diagnosis Date  . Low back pain 04/2011    Plain film 04/2011: spondylosis/old osteophyte vs endplate fx L3  . Abnormal EKG   . Heart murmur     ECHO 07/2011 showed mild AS and LVH.  TEE 09/2011 showed small PFO, bicuspid aortic valve but no stenosis or regurg.  [name] Hypertension 06/2011  . Hematuria 07/27/2011, 08/25/11    +infection-related  . UTI (lower urinary tract infection) 07/2011    MSSA  . Diabetes mellitus     Diabetic retinopathy and peripheral neuropathy.  . 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  F/u u/s for ongoing/worsening thrombocytopenia showed no more splenomegaly.  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  Gen surg and GI recommended flagyl and cipro x 2 wks..  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  . Cholelithiasis 09/2011  . Pyelonephritis 09/2011    MSSA  (+bacteremia); iv ancef via HH x 4 wks  . Alcoholism   . Peripheral neuropathy 2012    Diabetic:  Autonomic (pelvic) and LE polyneuropathy--[facility] testing showed that it is likely from DM  . Thrombocytopenia 2012/2013    40K-50K    MEDS:  Outpatient Prescriptions Prior to Visit  Medication Sig Dispense Refill  . acetaminophen (TYLENOL) 500 MG tablet Take 500 mg by mouth every 8 (eight) hours as needed.        Jae Dire Bandages & Supports (MEDICAL COMPRESSION STOCKINGS) MISC 1 Units by Does not apply route daily as needed. Apply to lower extremities daily as needed for lower extremity swelling  1 each  3  . ferrous sulfate 325 (65 FE) MG tablet Take 325 mg by mouth daily.       . furosemide (LASIX) 40 MG tablet Take 1 tablet (40 mg total) by mouth daily.  90 tablet  1  . gabapentin (NEURONTIN) 300 MG capsule Take 1 capsule (300 mg total) by mouth 3 (three) times daily. increase to 1 capsule three times per day as directed.  270 capsule  3  . glucagon 1 MG injection Follow package directions for low blood sugar (for emergency use for symptomatic hypoglycemia)  1 each  1  . glucose blood (FREESTYLE LITE) test strip PRN  32 each  12  . insulin aspart (NOVOLOG FLEXPEN) 100  UNIT/ML injection Base of 10 qAC BF, 12 qAC lunch, and 12 qAC supper plus sliding scale  15 pen  1  . Insulin Pen Needle 32G X 6 MM MISC Use with insulin pen for 4 injections daily  120 each  10  . Lancets (FREESTYLE) lancets PRN  100 each  5  . lisinopril (PRINIVIL,ZESTRIL) 2.5 MG tablet Take 1 tablet (2.5 mg total) by mouth daily.  90 tablet  1  . Multiple Vitamins-Minerals (MULTI FOR HIM) PACK Take 1 each by mouth daily.        . potassium chloride SA (K-DUR,KLOR-CON) 20 MEQ tablet Take 1 tablet (20 mEq total) by mouth daily.  90 tablet  1  . spironolactone (ALDACTONE) 50 MG tablet Take 1 tablet (50 mg total) by mouth daily.  90 tablet  1  . insulin glargine (LANTUS) 100 UNIT/ML injection Inject 46 Units into the skin at  bedtime.  30 mL  1  . clonazePAM (KLONOPIN) 1 MG tablet 1/2-1 tab qhs for restless legs syndrome  30 tablet  1    PE: Blood pressure 144/77, pulse 71, height 5\' 9"  (1.753 m), weight 223 lb (101.152 kg). Down 10 pounds compared to last visit on 08/03/12. Gen: Alert, well appearing.  Patient is oriented to person, place, time, and situation. AFFECT: pleasant, lucid thought and speech. ENT:  Eyes: no injection, icteris, swelling, or exudate.  EOMI, PERRLA. Nose: no drainage or turbinate edema/swelling.  No injection or focal lesion.  Mouth: lips without lesion/swelling.  Oral mucosa pink and moist.    Oropharynx without erythema, exudate, or swelling.  Neck: supple/nontender.  No LAD, mass, or TM.  Carotid pulses 2+ bilaterally, without bruits. CV: RRR, systolic murmur, no rub or gallop LUNG: CTA bilat, nonlabored resps ABD: increased abd girth, +flank dullness, shifting dullness, mild TTP in RUQ and mid epigastric regions--without guarding or rebound.  No bruit. EXT: 2+ edema LLE, 1+ edema RLE, minimal pinkish hue with flaky skin.    EKG: NSR, rate 66, poor R wave progression: no change from prior EKG  IMPRESSION AND PLAN:  Hypotension Vasovagal episode, unclear trigger.  EKG today  BP is up in office now.  No change in bp med at this time. He appears to have increased ascites, and perhaps some fluid shifts involved in this process was a trigger. He has just a touch of abd tenderness but he does not have signs of peritonitis at this time.   Could be the early effects of a viral syndrome.  Cirrhosis Possibly worsening--ascites increased.  Check labs today.   I am hesitant to increase diuretics since he had this hypotension episode today. He will f/u in 2 d and if still having some abdominal pain/tenderness then will check u/s for signs of acute cholecystitis (he has gallstones documented on a prior ultrasound).  Leg swelling LE edema, L>R (this asymmetry is new)--also with mild pain in  left calf.   Will continue current diuretic regimen.  Pt needs to limit Na. Will check D-dimer.     FOLLOW UP: 2 d

## 2012-09-11 LAB — COMPREHENSIVE METABOLIC PANEL
ALT: 32 U/L (ref 0–53)
Alkaline Phosphatase: 109 U/L (ref 39–117)
Sodium: 142 mEq/L (ref 135–145)
Total Bilirubin: 0.8 mg/dL (ref 0.3–1.2)
Total Protein: 6.4 g/dL (ref 6.0–8.3)

## 2012-09-11 NOTE — Assessment & Plan Note (Signed)
Possibly worsening--ascites increased.  Check labs today.   I am hesitant to increase diuretics since he had this hypotension episode today. He will f/u in 2 d and if still having some abdominal pain/tenderness then will check u/s for signs of acute cholecystitis (he has gallstones documented on a prior ultrasound).

## 2012-09-11 NOTE — Assessment & Plan Note (Addendum)
Vasovagal episode, unclear trigger.  EKG today  BP is up in office now.  No change in bp med at this time. He appears to have increased ascites, and perhaps some fluid shifts involved in this process was a trigger. He has just a touch of abd tenderness but he does not have signs of peritonitis at this time.   Could be the early effects of a viral syndrome.

## 2012-09-11 NOTE — Assessment & Plan Note (Signed)
LE edema, L>R (this asymmetry is new)--also with mild pain in left calf.   Will continue current diuretic regimen.  Pt needs to limit Na. Will check D-dimer.

## 2012-09-12 ENCOUNTER — Ambulatory Visit (INDEPENDENT_AMBULATORY_CARE_PROVIDER_SITE_OTHER): Payer: Managed Care, Other (non HMO) | Admitting: Family Medicine

## 2012-09-12 ENCOUNTER — Encounter: Payer: Self-pay | Admitting: Family Medicine

## 2012-09-12 ENCOUNTER — Ambulatory Visit (HOSPITAL_BASED_OUTPATIENT_CLINIC_OR_DEPARTMENT_OTHER)
Admission: RE | Admit: 2012-09-12 | Discharge: 2012-09-12 | Disposition: A | Discharge status: Home / Self Care | Payer: Managed Care, Other (non HMO) | Source: Ambulatory Visit | Attending: Family Medicine | Admitting: Family Medicine

## 2012-09-12 VITALS — BP 91/55 | HR 73 | Ht 69.0 in | Wt 231.0 lb

## 2012-09-12 DIAGNOSIS — I959 Hypotension, unspecified: Secondary | ICD-10-CM

## 2012-09-12 DIAGNOSIS — M7989 Other specified soft tissue disorders: Secondary | ICD-10-CM | POA: Insufficient documentation

## 2012-09-12 DIAGNOSIS — E875 Hyperkalemia: Secondary | ICD-10-CM

## 2012-09-12 DIAGNOSIS — K7460 Unspecified cirrhosis of liver: Secondary | ICD-10-CM

## 2012-09-12 DIAGNOSIS — Z23 Encounter for immunization: Secondary | ICD-10-CM

## 2012-09-12 NOTE — Assessment & Plan Note (Signed)
Left > right.  He describes peripheral neuropathy sx's (weaker from hip down to ankle).  However, recent d-dimer was borderline so we'll check venous u/s today of left leg today to r/o DVT.

## 2012-09-12 NOTE — Patient Instructions (Signed)
Stop your lisinopril, spironolactone, and your potassium pills. We'll recheck your blood tests today and possibly restart some of these meds in the future.

## 2012-09-12 NOTE — Assessment & Plan Note (Addendum)
Still having brief episodes.  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 Will stop lisinopril, aldactone, and potassium pills in light of his recent mild hyperkalemia as well as his hypotensive episodes. If lytes/cr ok, will consider increase in lasix SLIGHTLY to get a bit of fluid off. Check CMET today.

## 2012-09-12 NOTE — Progress Notes (Signed)
OFFICE NOTE  09/12/2012  CC:  Chief Complaint  Patient presents with  . Follow-up    BP issues, ascites--BP low again today at work and here at office; felt fine yesterday     HPI: Patient is a 51 y.o. Caucasian male who is here for 2d f/u for recent vasovagal episode. Yesterday he felt great and bp was 119/70s.  Today he got an episode of tunnel vision and lightheadedness again at work, systolic bp 90's per pt.  He has felt ok since then. His diarrhea and abd pain has stopped.  He has not had melena or hematochezia.  Pertinent PMH:  Past Medical History  Diagnosis Date  . Low back pain 04/2011    Plain film 04/2011: spondylosis/old osteophyte vs endplate fx L3  . Abnormal EKG   . Heart murmur     ECHO 07/2011 showed mild AS and LVH.  TEE 09/2011 showed small PFO, bicuspid aortic valve but no stenosis or regurg.  [name] Hypertension 06/2011  . Hematuria 07/27/2011, 08/25/11    +infection-related  . UTI (lower urinary tract infection) 07/2011    MSSA  . Diabetes mellitus     Diabetic retinopathy and peripheral neuropathy.  . 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  F/u u/s for ongoing/worsening thrombocytopenia showed no more splenomegaly.  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  Gen surg and GI recommended flagyl and cipro x 2 wks..  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  . Cholelithiasis 09/2011  . Pyelonephritis 09/2011    MSSA (+bacteremia); iv ancef via HH x 4 wks  . Alcoholism   . Peripheral neuropathy 2012    Diabetic:  Autonomic (pelvic) and LE polyneuropathy--[facility] testing showed that it is likely from DM  . Thrombocytopenia 2012/2013    40K-50K    MEDS:  Outpatient Prescriptions Prior to Visit  Medication Sig Dispense Refill    . acetaminophen (TYLENOL) 500 MG tablet Take 500 mg by mouth every 8 (eight) hours as needed.        Jae Dire Bandages & Supports (MEDICAL COMPRESSION STOCKINGS) MISC 1 Units by Does not apply route daily as needed. Apply to lower extremities daily as needed for lower extremity swelling  1 each  3  . ferrous sulfate 325 (65 FE) MG tablet Take 325 mg by mouth daily.       . furosemide (LASIX) 40 MG tablet Take 1 tablet (40 mg total) by mouth daily.  90 tablet  1  . gabapentin (NEURONTIN) 300 MG capsule Take 1 capsule (300 mg total) by mouth 3 (three) times daily. increase to 1 capsule three times per day as directed.  270 capsule  3  . glucagon 1 MG injection Follow package directions for low blood sugar (for emergency use for symptomatic hypoglycemia)  1 each  1  . glucose blood (FREESTYLE LITE) test strip PRN  32 each  12  . insulin aspart (NOVOLOG FLEXPEN) 100 UNIT/ML injection Base of 10 qAC BF, 12 qAC lunch, and 12 qAC supper plus sliding scale  15 pen  1  . insulin glargine (LANTUS) 100 UNIT/ML injection Inject 42 Units into the skin at bedtime.      . Insulin Pen Needle 32G X 6 MM MISC Use with insulin pen for  4 injections daily  120 each  10  . Lancets (FREESTYLE) lancets PRN  100 each  5  . lisinopril (PRINIVIL,ZESTRIL) 2.5 MG tablet Take 1 tablet (2.5 mg total) by mouth daily.  90 tablet  1  . Multiple Vitamins-Minerals (MULTI FOR HIM) PACK Take 1 each by mouth daily.        . potassium chloride SA (K-DUR,KLOR-CON) 20 MEQ tablet Take 1 tablet (20 mEq total) by mouth daily.  90 tablet  1  . spironolactone (ALDACTONE) 50 MG tablet Take 1 tablet (50 mg total) by mouth daily.  90 tablet  1    PE: Blood pressure 91/55, pulse 73, height 5\' 9"  (1.753 m), weight 231 lb (104.781 kg). Gen: Alert, well appearing.  Patient is oriented to person, place, time, and situation. Bilat LE edema, L>R-nontender.    IMPRESSION AND PLAN:  Hypotension Still having bried episodes.  I think his  cirrhosis/portal HTN is worsening and he is having fluid shifts from intravascular to extravascular space, resulting in increased edema and lower bp. Will stop lisinopril, aldactone, and potassium pills in light of his recent mild hyperkalemia as well as his hypotensive episodes. If lytes/cr ok, will consider increase in lasix SLIGHTLY to get a bit of fluid off. Check CMET today.  Leg swelling Left > right.  He describes peripheral neuropathy sx's (weaker from hip down to ankle).  However, recent d-dimer was borderline so we'll check venous u/s today of left leg today to r/o DVT.     FOLLOW UP: 2 wks

## 2012-09-13 LAB — COMPREHENSIVE METABOLIC PANEL
ALT: 27 U/L (ref 0–53)
Albumin: 3.5 g/dL (ref 3.5–5.2)
Alkaline Phosphatase: 112 U/L (ref 39–117)
Glucose, Bld: 190 mg/dL — ABNORMAL HIGH (ref 70–99)
Potassium: 5.7 mEq/L — ABNORMAL HIGH (ref 3.5–5.3)
Sodium: 138 mEq/L (ref 135–145)
Total Protein: 6.2 g/dL (ref 6.0–8.3)

## 2012-10-02 ENCOUNTER — Encounter: Payer: Self-pay | Admitting: Family Medicine

## 2012-10-02 ENCOUNTER — Ambulatory Visit (INDEPENDENT_AMBULATORY_CARE_PROVIDER_SITE_OTHER): Payer: Managed Care, Other (non HMO) | Admitting: Family Medicine

## 2012-10-02 VITALS — BP 172/82 | HR 69 | Ht 69.0 in | Wt 227.0 lb

## 2012-10-02 DIAGNOSIS — I959 Hypotension, unspecified: Secondary | ICD-10-CM

## 2012-10-02 DIAGNOSIS — E11621 Type 2 diabetes mellitus with foot ulcer: Secondary | ICD-10-CM

## 2012-10-02 DIAGNOSIS — E875 Hyperkalemia: Secondary | ICD-10-CM

## 2012-10-02 DIAGNOSIS — K7460 Unspecified cirrhosis of liver: Secondary | ICD-10-CM

## 2012-10-02 DIAGNOSIS — E1169 Type 2 diabetes mellitus with other specified complication: Secondary | ICD-10-CM

## 2012-10-02 DIAGNOSIS — E1165 Type 2 diabetes mellitus with hyperglycemia: Secondary | ICD-10-CM | POA: Insufficient documentation

## 2012-10-02 DIAGNOSIS — L97509 Non-pressure chronic ulcer of other part of unspecified foot with unspecified severity: Secondary | ICD-10-CM | POA: Insufficient documentation

## 2012-10-02 LAB — COMPREHENSIVE METABOLIC PANEL
AST: 30 U/L (ref 0–37)
Albumin: 3.3 g/dL — ABNORMAL LOW (ref 3.5–5.2)
Alkaline Phosphatase: 120 U/L — ABNORMAL HIGH (ref 39–117)
BUN: 23 mg/dL (ref 6–23)
Creatinine, Ser: 0.8 mg/dL (ref 0.4–1.5)
Potassium: 4.4 mEq/L (ref 3.5–5.1)
Total Bilirubin: 0.7 mg/dL (ref 0.3–1.2)

## 2012-10-02 NOTE — Progress Notes (Signed)
OFFICE VISIT  10/02/2012   CC:  Chief Complaint  Patient presents with  . Follow-up    BP, edema-not wearing stockings today; electrolytes-check labs today; also feet are cracked/bleeding     HPI:    Patient is a 51 y.o. Caucasian male who presents for 3 wk f/u cirrhosis, some autonomic-type bp/hr abnormalities that were leading to presyncope.  Last visit we stopped his aldactone, ACE-I, and potassium due to his elevated potassium level.  He actually seemed to be volume overloaded at that visit, but I kept him on 40mg  qd lasix.   He reports feeling great today except for about the last week he has had "cracks" in bottom of both feet.  LE edema has been minimal.  He works in [employer] a lot and says he is constantly fighting to keep feet dry and watch for problems.  No pain in feet.  He denies numbness or tingling in them as well. He denies drainage or bleeding from the fissures, deneis fever or malaise.  Admits that glucoses are not at goal but as per his usual he is tough to pin down with specific numbers--mentions some 150s, occasional >200s when he really strays from diabetic diet.   Past Medical History  Diagnosis Date  . Low back pain 04/2011    Plain film 04/2011: spondylosis/old osteophyte vs endplate fx L3  . Abnormal EKG   . Heart murmur     ECHO 07/2011 showed mild AS and LVH.  TEE 09/2011 showed small PFO, bicuspid aortic valve but no stenosis or regurg.  [name] Hypertension 06/2011  . Hematuria 07/27/2011, 08/25/11    +infection-related  . UTI (lower urinary tract infection) 07/2011    MSSA  . Diabetes mellitus     Diabetic retinopathy and peripheral neuropathy.  . 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  F/u u/s for ongoing/worsening thrombocytopenia showed no more splenomegaly.  [name] Perforation of large intestine 09/2011    Presumably from perforated diverticulum:  fistula noted on CT, no abscess  (10/05/11).  Gen surg and GI recommended flagyl and cipro x 2 wks..  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  . Cholelithiasis 09/2011  . Pyelonephritis 09/2011    MSSA (+bacteremia); iv ancef via HH x 4 wks  . Alcoholism   . Peripheral neuropathy 2012    Diabetic:  Autonomic (pelvic) and LE polyneuropathy--[facility] testing showed that it is likely from DM  . Thrombocytopenia 2012/2013    40K-50K    Past Surgical History  Procedure Date  . Knee arthroscopy     Bilateral    Outpatient Prescriptions Prior to Visit  Medication Sig Dispense Refill  . acetaminophen (TYLENOL) 500 MG tablet Take 500 mg by mouth every 8 (eight) hours as needed.        Jae Dire Bandages & Supports (MEDICAL COMPRESSION STOCKINGS) MISC 1 Units by Does not apply route daily as needed. Apply to lower extremities daily as needed for lower extremity swelling  1 each  3  . furosemide (LASIX) 40 MG tablet Take 1 tablet (40 mg total) by mouth daily.  90 tablet  1  . gabapentin (NEURONTIN) 300 MG capsule Take 1 capsule (300 mg total) by mouth 3 (three) times daily. increase to 1 capsule three times per day as directed.  270 capsule  3  . glucagon 1 MG injection  Follow package directions for low blood sugar (for emergency use for symptomatic hypoglycemia)  1 each  1  . glucose blood (FREESTYLE LITE) test strip PRN  32 each  12  . insulin aspart (NOVOLOG FLEXPEN) 100 UNIT/ML injection Base of 10 qAC BF, 12 qAC lunch, and 12 qAC supper plus sliding scale  15 pen  1  . insulin glargine (LANTUS) 100 UNIT/ML injection Inject 42 Units into the skin at bedtime.      . Insulin Pen Needle 32G X 6 MM MISC Use with insulin pen for 4 injections daily  120 each  10  . Lancets (FREESTYLE) lancets PRN  100 each  5  . Multiple Vitamins-Minerals (MULTI FOR HIM) PACK Take 1 each by mouth daily.        . ferrous sulfate 325 (65 FE) MG tablet Take 325 mg by mouth  daily.       . potassium chloride SA (K-DUR,KLOR-CON) 20 MEQ tablet Take 1 tablet (20 mEq total) by mouth daily.  90 tablet  1  . spironolactone (ALDACTONE) 50 MG tablet Take 1 tablet (50 mg total) by mouth daily.  90 tablet  1  . lisinopril (PRINIVIL,ZESTRIL) 2.5 MG tablet Take 1 tablet (2.5 mg total) by mouth daily.  90 tablet  1  **Not taking lisinopril, aldactone, potassium, or iron as listed above.  Allergies  Allergen Reactions  . Sulfa Antibiotics   . Naproxen Nausea Only    ROS As per HPI  PE: Blood pressure 172/82, pulse 69, height 5\' 9"  (1.753 m), weight 227 lb (102.967 kg). Gen: Alert, well appearing.  Patient is oriented to person, place, time, and situation. No jaundice or pallor. ENT: right eye subconjunctival hemorrhage lateral to cornea.   Oropharynx: clear, no focal lesions or erythema or exudate or swelling. Neck - No masses or thyromegaly or limitation in range of motion CV: RRR, no m/r/g.   LUNGS: CTA bilat, nonlabored resps, good aeration in all lung fields. ABD: soft, rotund, no dilated skin veins, no pitting edema, no HSM.  BS normal. EXT: no clubbing, cyanosis, or edema.  FEET: DP and PT pulses 2+ bilat.  Mild thickening of toenails and mild dry/flaky texture to skin.  No calluses. Light touch sensation intact.  Left foot has a linear ulceration on plantar surface about 5 cm long near 5th toe. Right foot with similar lesion near 1st toe on plantar surface.  No erythema, exudate, or foul odor.   Nontender.  LABS:  LABS: none today  IMPRESSION AND PLAN:  Cirrhosis Stable.  No med changes at this time. If recheck of Cr and potassium ok then will add aldactone back on slowly.   Type II or unspecified type diabetes mellitus without mention of complication, uncontrolled Pt's numbers indicate poor control, dietary indiscretion part of the issue+ pt's seeming inability to adequately titrate insulins to get CBGs at goal range. Foot exam today essentially  normal except for his deep fissure-type ulcerations on both feet. Check HbA1c today.  Hyperkalemia Recheck BMET today to see if this is normalized with recent holding of aldactone, lisinopril, and potassium supplement.  DM foot ulcers No sign of infection. These are fairly new. Will refer ASAP to wound care center at [facility].  Hypotension Resolved.  BP actually up today. If lytes/cr stable on check today, will add back aldactone and then possibly restart lisinopril in near future. I believe he has autonomic dysfunction as part of his diabetic PN.   An After Visit  Summary was printed and given to the patient.  FOLLOW UP: Return in about 1 month (around 11/02/2012) for f/u DM 2 and feet wounds.

## 2012-10-02 NOTE — Assessment & Plan Note (Signed)
Resolved.  BP actually up today. If lytes/cr stable on check today, will add back aldactone and then possibly restart lisinopril in near future. I believe he has autonomic dysfunction as part of his diabetic PN.

## 2012-10-02 NOTE — Assessment & Plan Note (Signed)
No sign of infection. These are fairly new. Will refer ASAP to wound care center at [facility].

## 2012-10-02 NOTE — Assessment & Plan Note (Signed)
Stable.  No med changes at this time. If recheck of Cr and potassium ok then will add aldactone back on slowly.

## 2012-10-02 NOTE — Assessment & Plan Note (Signed)
Recheck BMET today to see if this is normalized with recent holding of aldactone, lisinopril, and potassium supplement.

## 2012-10-02 NOTE — Assessment & Plan Note (Signed)
Pt's numbers indicate poor control, dietary indiscretion part of the issue+ pt's seeming inability to adequately titrate insulins to get CBGs at goal range. Foot exam today essentially normal except for his deep fissure-type ulcerations on both feet. Check HbA1c today.

## 2012-10-10 ENCOUNTER — Ambulatory Visit (HOSPITAL_COMMUNITY)
Admission: RE | Admit: 2012-10-10 | Discharge: 2012-10-10 | Disposition: A | Discharge status: Home / Self Care | Payer: Managed Care, Other (non HMO) | Source: Ambulatory Visit | Attending: General Surgery | Admitting: General Surgery

## 2012-10-10 ENCOUNTER — Encounter (HOSPITAL_BASED_OUTPATIENT_CLINIC_OR_DEPARTMENT_OTHER): Payer: Managed Care, Other (non HMO) | Attending: General Surgery

## 2012-10-10 ENCOUNTER — Other Ambulatory Visit (HOSPITAL_BASED_OUTPATIENT_CLINIC_OR_DEPARTMENT_OTHER): Payer: Self-pay | Admitting: General Surgery

## 2012-10-10 DIAGNOSIS — L84 Corns and callosities: Secondary | ICD-10-CM | POA: Insufficient documentation

## 2012-10-10 DIAGNOSIS — E1069 Type 1 diabetes mellitus with other specified complication: Secondary | ICD-10-CM | POA: Insufficient documentation

## 2012-10-10 DIAGNOSIS — E669 Obesity, unspecified: Secondary | ICD-10-CM | POA: Insufficient documentation

## 2012-10-10 DIAGNOSIS — Z794 Long term (current) use of insulin: Secondary | ICD-10-CM | POA: Insufficient documentation

## 2012-10-10 DIAGNOSIS — M869 Osteomyelitis, unspecified: Secondary | ICD-10-CM

## 2012-10-10 DIAGNOSIS — K7460 Unspecified cirrhosis of liver: Secondary | ICD-10-CM | POA: Insufficient documentation

## 2012-10-10 DIAGNOSIS — Z79899 Other long term (current) drug therapy: Secondary | ICD-10-CM | POA: Insufficient documentation

## 2012-10-10 DIAGNOSIS — G589 Mononeuropathy, unspecified: Secondary | ICD-10-CM | POA: Insufficient documentation

## 2012-10-10 DIAGNOSIS — L97409 Non-pressure chronic ulcer of unspecified heel and midfoot with unspecified severity: Secondary | ICD-10-CM | POA: Insufficient documentation

## 2012-10-10 DIAGNOSIS — L97509 Non-pressure chronic ulcer of other part of unspecified foot with unspecified severity: Secondary | ICD-10-CM | POA: Insufficient documentation

## 2012-10-10 DIAGNOSIS — R55 Syncope and collapse: Secondary | ICD-10-CM | POA: Insufficient documentation

## 2012-10-10 DIAGNOSIS — I10 Essential (primary) hypertension: Secondary | ICD-10-CM | POA: Insufficient documentation

## 2012-10-10 NOTE — Progress Notes (Signed)
[facility]  NAME:  [name], VORHIES NO.:  [id number]  MEDICAL RECORD NO.:  [id number]      DATE OF BIRTH:  Jun 02, 1961  PHYSICIAN:  Ardath Sax, M.D.           VISIT DATE:                                  OFFICE VISIT   This is a 51 year old African American male who is somewhat overweight. He weighs 230 pounds and is 5 feet 9 inches.  He is an insulin-dependent diabetic.  He also has a history of alcoholism and cirrhosis.  He has hypertension.  He has had hematuria in the past, apparently kidney stones.  He is on Lasix, gabapentin, glucagon, insulin both NovoLog and Lantus.  He is on potassium and Aldactone and lisinopril.  Today, his blood pressure was 140/90, pulse 66, temperature 98.6.  His blood sugar however was 300.  This man has 2 almost mirror images, diabetic foot ulcers on the plantar aspect of his great toes.  It is about 5 cm x 2 cm.  Today, we debrided the callus and we are going to apply for a Dermagraft.  We put Silvercel dressings on.  These wounds are fairly clean and they do not appear to go to the bone and they do not appear to be infected.  So we will see what they look like next week and hopefully we will have him approved for Dermagraft.  His diagnosis is bilateral diabetic foot ulcers on the volar aspect of both of his hallux, also obesity, also hypertension, cirrhosis, type 1 diabetes, neuropathy and he also has been complaining of syncopal episodes of which he has been worked up for in the hospital and they found no reason for the syncopal episodes.     Ardath Sax, M.D.     PP/MEDQ  D:  10/10/2012  T:  10/10/2012  Job:  [id number]

## 2012-10-16 ENCOUNTER — Ambulatory Visit (HOSPITAL_BASED_OUTPATIENT_CLINIC_OR_DEPARTMENT_OTHER)
Admission: RE | Admit: 2012-10-16 | Discharge: 2012-10-16 | Disposition: A | Discharge status: Home / Self Care | Payer: Managed Care, Other (non HMO) | Source: Ambulatory Visit | Attending: Family Medicine | Admitting: Family Medicine

## 2012-10-16 ENCOUNTER — Ambulatory Visit (INDEPENDENT_AMBULATORY_CARE_PROVIDER_SITE_OTHER): Payer: Managed Care, Other (non HMO) | Admitting: Family Medicine

## 2012-10-16 ENCOUNTER — Encounter: Payer: Self-pay | Admitting: Family Medicine

## 2012-10-16 VITALS — BP 125/69 | HR 80 | Temp 99.4°F | Ht 69.0 in | Wt 225.0 lb

## 2012-10-16 DIAGNOSIS — E1169 Type 2 diabetes mellitus with other specified complication: Secondary | ICD-10-CM

## 2012-10-16 DIAGNOSIS — I10 Essential (primary) hypertension: Secondary | ICD-10-CM

## 2012-10-16 DIAGNOSIS — L0390 Cellulitis, unspecified: Secondary | ICD-10-CM

## 2012-10-16 DIAGNOSIS — E11621 Type 2 diabetes mellitus with foot ulcer: Secondary | ICD-10-CM

## 2012-10-16 DIAGNOSIS — L97509 Non-pressure chronic ulcer of other part of unspecified foot with unspecified severity: Secondary | ICD-10-CM

## 2012-10-16 DIAGNOSIS — L03119 Cellulitis of unspecified part of limb: Secondary | ICD-10-CM | POA: Insufficient documentation

## 2012-10-16 DIAGNOSIS — L02619 Cutaneous abscess of unspecified foot: Secondary | ICD-10-CM | POA: Insufficient documentation

## 2012-10-16 DIAGNOSIS — L0291 Cutaneous abscess, unspecified: Secondary | ICD-10-CM

## 2012-10-16 MED ORDER — CEFTRIAXONE SODIUM 1 G IJ SOLR
1.0000 g | Freq: Once | INTRAMUSCULAR | Status: AC
  Administered 2012-10-16: 1 g via INTRAMUSCULAR

## 2012-10-16 MED ORDER — CEFDINIR 300 MG PO CAPS: 300.0000 mg | ORAL_CAPSULE | Freq: Two times a day (BID) | ORAL | Status: DC

## 2012-10-16 MED ORDER — GADOBENATE DIMEGLUMINE 529 MG/ML IV SOLN
20.0000 mL | Freq: Once | INTRAVENOUS | Status: AC | PRN
  Administered 2012-10-16: 20 mL via INTRAVENOUS

## 2012-10-16 NOTE — Assessment & Plan Note (Addendum)
He had been having some hypotension problems but these have resolved. BP normal and in near future will be adding back his low dose lisinopril.

## 2012-10-16 NOTE — Progress Notes (Signed)
OFFICE NOTE  10/16/2012  CC:  Chief Complaint  Patient presents with  . Wound Infection    right foot, has seen wound care, not packing/wrapping as instructed; temp 100 at work; nurse told him to seek medical care; wound care unavailable per patient     HPI: Patient is a 51 y.o. Caucasian male who is here for infection in his feet.   Two weeks ago I saw him for fissure-type plantar foot ulcerations and sent him to the wound clinic. He got these debrided and plain films of feet were negative for any sign of osteomyelitis.  He reports feeling well after this, says he was able to go to work and feet felt fine.  He works in an environment that causes his feet to get wet on and off throughout his day.  In the last 24h he has developed increased swelling, redness, and pain of his right foot.  His employer told him to come see me for further eval. He does say he is beginning to feel malaise today, temp at employer's was "one hundred point something".   Also reports a few days of sniffles, pnd, mild coughing.  No SOB or chest pain.  NO face pain.  Pertinent PMH:  Past Medical History  Diagnosis Date  . Low back pain 04/2011    Plain film 04/2011: spondylosis/old osteophyte vs endplate fx L3  . Abnormal EKG   . Heart murmur     ECHO 07/2011 showed mild AS and LVH.  TEE 09/2011 showed small PFO, bicuspid aortic valve but no stenosis or regurg.  [name] Hypertension 06/2011  . Hematuria 07/27/2011, 08/25/11    +infection-related  . UTI (lower urinary tract infection) 07/2011    MSSA  . Diabetes mellitus     Diabetic retinopathy and peripheral neuropathy.  . 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  F/u u/s for ongoing/worsening thrombocytopenia showed no more splenomegaly.  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  Gen surg and GI recommended  flagyl and cipro x 2 wks..  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  . Cholelithiasis 09/2011  . Pyelonephritis 09/2011    MSSA (+bacteremia); iv ancef via HH x 4 wks  . Alcoholism   . Peripheral neuropathy 2012    Diabetic:  Autonomic (pelvic) and LE polyneuropathy--[facility] testing showed that it is likely from DM  . Thrombocytopenia 2012/2013    40K-50K    MEDS:  Outpatient Prescriptions Prior to Visit  Medication Sig Dispense Refill  . acetaminophen (TYLENOL) 500 MG tablet Take 500 mg by mouth every 8 (eight) hours as needed.        . furosemide (LASIX) 40 MG tablet Take 1 tablet (40 mg total) by mouth daily.  90 tablet  1  . gabapentin (NEURONTIN) 300 MG capsule Take 1 capsule (300 mg total) by mouth 3 (three) times daily. increase to 1 capsule three times per day as directed.  270 capsule  3  . glucagon 1 MG injection Follow package directions for low blood sugar (for emergency use for symptomatic hypoglycemia)  1 each  1  . glucose blood (FREESTYLE LITE) test strip PRN  32 each  12  . insulin glargine (LANTUS) 100 UNIT/ML injection Inject 44 Units into the skin at bedtime.       [name]  Insulin Pen Needle 32G X 6 MM MISC Use with insulin pen for 4 injections daily  120 each  10  . Lancets (FREESTYLE) lancets PRN  100 each  5  . Multiple Vitamins-Minerals (MULTI FOR HIM) PACK Take 1 each by mouth daily.        . insulin aspart (NOVOLOG FLEXPEN) 100 UNIT/ML injection Base of 10 qAC BF, 12 qAC lunch, and 12 qAC supper plus sliding scale  15 pen  1  . Elastic Bandages & Supports (MEDICAL COMPRESSION STOCKINGS) MISC 1 Units by Does not apply route daily as needed. Apply to lower extremities daily as needed for lower extremity swelling  1 each  3  . potassium chloride SA (K-DUR,KLOR-CON) 20 MEQ tablet Take 1 tablet (20 mEq total) by mouth daily.  90 tablet  1  . spironolactone (ALDACTONE) 50 MG tablet Take 1 tablet  (50 mg total) by mouth daily.  90 tablet  1  . ferrous sulfate 325 (65 FE) MG tablet Take 325 mg by mouth daily.        No facility-administered medications prior to visit.    PE: Blood pressure 125/69, pulse 80, temperature 99.4 F (37.4 C), temperature source Temporal, height 5\' 9"  (1.753 m), weight 225 lb (102.059 kg). Gen: Alert, nontoxic but tired-appearing.  Patient is oriented to person, place, time, and situation. VS: noted--normal. Gen: alert, NAD, NONTOXIC APPEARING. HEENT: eyes without injection, drainage, or swelling.  Ears: EACs clear, TMs with normal light reflex and landmarks.  Nose: Clear rhinorrhea, with some dried, crusty exudate adherent to mildly injected mucosa.  No purulent d/c.  No paranasal sinus TTP.  No facial swelling.  Throat and mouth without focal lesion.  No pharyngial swelling, erythema, or exudate.   Neck: supple, no LAD.   LUNGS: CTA bilat, nonlabored resps.   CV: RRR, no m/r/g. EXT: no c/c/e SKIN: no rash Feet: left foot lateral plantar surface near 5th metatarsal head with approx 2 cm oval defect in skin that extends into soft tissues.  This region has no erythema, no foul odor, no drainage.  Some indurated tissue is surrounding this opening. Right foot: mild swelling medially, erythema medially on foot and it extends just a bit up into his ankle.  The erythema extends to plantar surface where he has a medially located linear tissue erosion with a fresh blister that erupted when we took his gauze dressings off today.  This blister fluid was clear. Sensation intact to my touching and probing on his feet today.  I cannot see any portion of this wound penetrating deep into subQ tissues.  He has moderate TTP of entire forefoot and medial foot and ankle.   IMPRESSION AND PLAN:  Cellulitis I am worried about infected diabetic foot ulcer and potentially osteomyelitis. Plain films were negative 6d ago.  Will obtain foot MRI today (right) w and w/out  contrast. Gave rocephin 1g IM today and started omnicef 300mg  bid x 10d (to start tomorrow). He has f/u with his orthopedist at the wound care center tomorrow. I'll see him back in 3d.  DM foot ulcers Continue wound care clinic for f/u of these. R/o osteo or deep soft tissue infection today in right foot.  HTN (hypertension), benign He had been having some hypotension problems but these have resolved. BP normal and in near future will be adding back his low dose lisinopril.   Spent 45 min with pt today, with > 50% of this time spent in counseling and care coordination for  the above problem.  An After Visit Summary was printed and given to the patient.  FOLLOW UP:  3d

## 2012-10-16 NOTE — Assessment & Plan Note (Signed)
Continue wound care clinic for f/u of these. R/o osteo or deep soft tissue infection today in right foot.

## 2012-10-16 NOTE — Assessment & Plan Note (Signed)
I am worried about infected diabetic foot ulcer and potentially osteomyelitis. Plain films were negative 6d ago.  Will obtain foot MRI today (right) w and w/out contrast. Gave rocephin 1g IM today and started omnicef 300mg  bid x 10d (to start tomorrow). He has f/u with his orthopedist at the wound care center tomorrow. I'll see him back in 3d.

## 2012-10-19 ENCOUNTER — Encounter: Payer: Self-pay | Admitting: Family Medicine

## 2012-10-19 ENCOUNTER — Other Ambulatory Visit: Payer: Self-pay | Admitting: Family Medicine

## 2012-10-19 ENCOUNTER — Ambulatory Visit (INDEPENDENT_AMBULATORY_CARE_PROVIDER_SITE_OTHER): Payer: Managed Care, Other (non HMO) | Admitting: Family Medicine

## 2012-10-19 ENCOUNTER — Encounter: Payer: Self-pay | Admitting: *Deleted

## 2012-10-19 VITALS — BP 155/77 | HR 83 | Temp 98.3°F | Ht 69.0 in | Wt 222.0 lb

## 2012-10-19 DIAGNOSIS — L0291 Cutaneous abscess, unspecified: Secondary | ICD-10-CM

## 2012-10-19 DIAGNOSIS — I8310 Varicose veins of unspecified lower extremity with inflammation: Secondary | ICD-10-CM

## 2012-10-19 DIAGNOSIS — L0390 Cellulitis, unspecified: Secondary | ICD-10-CM

## 2012-10-19 DIAGNOSIS — I872 Venous insufficiency (chronic) (peripheral): Secondary | ICD-10-CM | POA: Insufficient documentation

## 2012-10-19 MED ORDER — FUROSEMIDE 40 MG PO TABS: 40.0000 mg | ORAL_TABLET | Freq: Every day | ORAL | Status: DC

## 2012-10-19 MED ORDER — FLUTICASONE PROPIONATE 0.05 % EX CREA: TOPICAL_CREAM | CUTANEOUS | Status: DC

## 2012-10-19 NOTE — Assessment & Plan Note (Signed)
Right foot:  improving, day 4/10 of antibiotic. Recent MRI foot reassuring.  Area of recent debridement by wound clinic looks good. I recommended he continue his oral antibiotic.  He'll continue daily wet-to-dry dressing changes on his foot ulcer (both feet). Remain out of work and wear post-op shoe and will f/u again in 1 wk.  He has wound care center f/u set for next week as well.

## 2012-10-19 NOTE — Assessment & Plan Note (Signed)
Primarily left leg/foot. Monitor for now.  No meds rx'd today.

## 2012-10-19 NOTE — Progress Notes (Signed)
OFFICE NOTE  10/19/2012  CC:  Chief Complaint  Patient presents with  . Follow-up    cellulitis     HPI: Patient is a 51 y.o. Caucasian male who is here for 3 day f/u cellulitis. He is feeling better.  Got another debridement of bottom of right foot 2 days ago at wound clinic.  Has been taking abx and elevating leg, wearing post-op shoe.  No fever or malaise.  No c/o regarding left foot but it does appear more darker pink/red lately on top of foot and over ant tibial surface where he has chronic venous stasis dermatitis changes.  Pertinent PMH:  Past Medical History  Diagnosis Date  . Low back pain 04/2011    Plain film 04/2011: spondylosis/old osteophyte vs endplate fx L3  . Abnormal EKG   . Heart murmur     ECHO 07/2011 showed mild AS and LVH.  TEE 09/2011 showed small PFO, bicuspid aortic valve but no stenosis or regurg.  [name] Hypertension 06/2011  . Hematuria 07/27/2011, 08/25/11    +infection-related  . UTI (lower urinary tract infection) 07/2011    MSSA  . Diabetes mellitus     Diabetic retinopathy and peripheral neuropathy.  . 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  F/u u/s for ongoing/worsening thrombocytopenia showed no more splenomegaly.  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  Gen surg and GI recommended flagyl and cipro x 2 wks..  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  . Cholelithiasis 09/2011  . Pyelonephritis 09/2011    MSSA (+bacteremia); iv ancef via HH x 4 wks  . Alcoholism   . Peripheral neuropathy 2012    Diabetic:  Autonomic (pelvic) and LE polyneuropathy--[facility] testing showed that it is likely from DM  . Thrombocytopenia 2012/2013    40K-50K    MEDS:  Outpatient Prescriptions Prior to Visit  Medication Sig  Dispense Refill  . acetaminophen (TYLENOL) 500 MG tablet Take 500 mg by mouth every 8 (eight) hours as needed.        . cefdinir (OMNICEF) 300 MG capsule Take 1 capsule (300 mg total) by mouth 2 (two) times daily.  20 capsule  0  . Elastic Bandages & Supports (MEDICAL COMPRESSION STOCKINGS) MISC 1 Units by Does not apply route daily as needed. Apply to lower extremities daily as needed for lower extremity swelling  1 each  3  . gabapentin (NEURONTIN) 300 MG capsule Take 1 capsule (300 mg total) by mouth 3 (three) times daily. increase to 1 capsule three times per day as directed.  270 capsule  3  . glucagon 1 MG injection Follow package directions for low blood sugar (for emergency use for symptomatic hypoglycemia)  1 each  1  . glucose blood (FREESTYLE LITE) test strip PRN  32 each  12  . insulin aspart (NOVOLOG) 100 UNIT/ML injection Base of 12 qAC BF, 12 qAC lunch, and 12 qAC supper plus sliding scale      . insulin glargine (LANTUS) 100 UNIT/ML injection Inject 44 Units into the skin at bedtime.       . Insulin Pen Needle 32G X 6 MM MISC Use with insulin pen for 4 injections daily  120 each  10  . Lancets (FREESTYLE) lancets PRN  100 each  5  . Multiple Vitamins-Minerals (MULTI FOR HIM) PACK Take 1 each by mouth daily.        . furosemide (LASIX) 40 MG tablet Take 1 tablet (40 mg total) by mouth daily.  90 tablet  1  . potassium chloride SA (K-DUR,KLOR-CON) 20 MEQ tablet Take 1 tablet (20 mEq total) by mouth daily.  90 tablet  1  . spironolactone (ALDACTONE) 50 MG tablet Take 1 tablet (50 mg total) by mouth daily.  90 tablet  1    PE: Blood pressure 155/77, pulse 83, temperature 98.3 F (36.8 C), temperature source Temporal, height 5\' 9"  (1.753 m), weight 222 lb (100.699 kg). Gen: Alert, well appearing.  Patient is oriented to person, place, time, and situation. Right ankle: no erythema or warmth or swelling or tenderness. Inferomedial portion of right foot is still erythematous and warm  but he has no tenderness.  Sensation intact to my touch.  He has an oblong 6-7 cm sized superficial ulcer where his blister was last visit.  This is what was debrided at wound clinic last week and it looks good.  His longitudinal, fissure-like skin defect is indurated and nonerythematous, nontender.   Left plantar surface with nonerythematous, nontender ulceration near 4th/5th metatarsal head.  No swelling, no exudate, no foul odor. His left anterior tibial surface and dorsum of left foot have erythema and warmth but no tenderness.  He has 2+ pitting edema in these areas.  No streaking, no fluctuance.  +Hyperkeratotic skin on anterior tibial surface but not so much on dorsum of foot.  IMPRESSION AND PLAN:  Cellulitis Right foot:  improving, day 4/10 of antibiotic. Recent MRI foot reassuring.  Area of recent debridement by wound clinic looks good. I recommended he continue his oral antibiotic.  He'll continue daily wet-to-dry dressing changes on his foot ulcer (both feet). Remain out of work and wear post-op shoe and will f/u again in 1 wk.  He has wound care center f/u set for next week as well.  Venous stasis dermatitis Primarily left leg/foot. Monitor for now.  No meds rx'd today.   An After Visit Summary was printed and given to the patient.   FOLLOW UP: 1 wk

## 2012-10-22 ENCOUNTER — Encounter: Payer: Self-pay | Admitting: Family Medicine

## 2012-10-22 ENCOUNTER — Ambulatory Visit (INDEPENDENT_AMBULATORY_CARE_PROVIDER_SITE_OTHER): Payer: Managed Care, Other (non HMO) | Admitting: Family Medicine

## 2012-10-22 ENCOUNTER — Ambulatory Visit
Admission: RE | Admit: 2012-10-22 | Discharge: 2012-10-22 | Disposition: A | Discharge status: Home / Self Care | Payer: Managed Care, Other (non HMO) | Source: Ambulatory Visit | Attending: Family Medicine | Admitting: Family Medicine

## 2012-10-22 VITALS — BP 167/82 | HR 79 | Temp 98.2°F | Ht 69.0 in | Wt 219.0 lb

## 2012-10-22 DIAGNOSIS — E1369 Other specified diabetes mellitus with other specified complication: Secondary | ICD-10-CM

## 2012-10-22 DIAGNOSIS — L97509 Non-pressure chronic ulcer of other part of unspecified foot with unspecified severity: Secondary | ICD-10-CM

## 2012-10-22 DIAGNOSIS — E1169 Type 2 diabetes mellitus with other specified complication: Secondary | ICD-10-CM

## 2012-10-22 DIAGNOSIS — E13621 Other specified diabetes mellitus with foot ulcer: Secondary | ICD-10-CM

## 2012-10-22 DIAGNOSIS — E11621 Type 2 diabetes mellitus with foot ulcer: Secondary | ICD-10-CM

## 2012-10-22 MED ORDER — DOXYCYCLINE MONOHYDRATE 100 MG PO CAPS: 100.0000 mg | ORAL_CAPSULE | Freq: Two times a day (BID) | ORAL | Status: DC

## 2012-10-22 MED ORDER — GADOBENATE DIMEGLUMINE 529 MG/ML IV SOLN
10.0000 mL | Freq: Once | INTRAVENOUS | Status: AC | PRN
  Administered 2012-10-22: 10 mL via INTRAVENOUS

## 2012-10-22 NOTE — Addendum Note (Signed)
Addended by: Baldemar Lenis R on: 10/22/2012 02:54 PM   Modules accepted: Orders

## 2012-10-22 NOTE — Assessment & Plan Note (Signed)
New on on left foot dorsal surface, opposite the one on plantar surface. Worrisome for infection deep in soft tissues, also possible osteomyelitis. Will obtain MRI left foot (plain films by wound care clinic a couple of weeks ago were normal). Add doxycycline 100mg  bid x 10d, continue/finish omnicef for recent cellulitis on right foot. Sent swab from new ulcer for c/s today.   Will obtain vascular testing done by [facility] recently.  I'm considering new referral to either an orthopedic surgeon or a vascular surgeon regarding his progressive foot ulcer problems.

## 2012-10-22 NOTE — Progress Notes (Signed)
OFFICE NOTE  10/22/2012  CC:  Chief Complaint  Patient presents with  . Cellulitis    left 5th toe, blister popped this AM when removing bandage     HPI: Patient is a 51 y.o. Caucasian male who is here for blister on left foot. Onset 48 h ago after he took the dressing off of it.  Noted it was swelling before dressing was taken off. The blister popped and now the tissue lookes mushy and is oozing.  His ulcer on bottom of foot under the same to (5th toe) is filling in a bit.  No fever, no malaise, no pain. Says most of his glucoses have been around 150's, rarely over 200 lately. Lab Results  Component Value Date   HGBA1C 8.7* 10/02/2012     Pertinent PMH:  DM 2, poor control Peripheral neuropathy Cirrhosis w/ portal HTN HTN Recent foot ulcer on bottom of right and left foot--currently seeing [facility] wound care center.  Past surgical, social, and family history reviewed and no changes noted since last office visit.  MEDS:  Outpatient Prescriptions Prior to Visit  Medication Sig Dispense Refill  . acetaminophen (TYLENOL) 500 MG tablet Take 500 mg by mouth every 8 (eight) hours as needed.        . cefdinir (OMNICEF) 300 MG capsule Take 1 capsule (300 mg total) by mouth 2 (two) times daily.  20 capsule  0  . Elastic Bandages & Supports (MEDICAL COMPRESSION STOCKINGS) MISC 1 Units by Does not apply route daily as needed. Apply to lower extremities daily as needed for lower extremity swelling  1 each  3  . fluticasone (CUTIVATE) 0.05 % cream Apply to lower legs rash bid prn  30 g  2  . furosemide (LASIX) 40 MG tablet Take 1 tablet (40 mg total) by mouth daily.  90 tablet  1  . gabapentin (NEURONTIN) 300 MG capsule Take 1 capsule (300 mg total) by mouth 3 (three) times daily. increase to 1 capsule three times per day as directed.  270 capsule  3  . glucagon 1 MG injection Follow package directions for low blood sugar (for emergency use for symptomatic hypoglycemia)  1 each  1  . glucose  blood (FREESTYLE LITE) test strip PRN  32 each  12  . insulin aspart (NOVOLOG) 100 UNIT/ML injection Base of 12 qAC BF, 12 qAC lunch, and 12 qAC supper plus sliding scale      . insulin glargine (LANTUS) 100 UNIT/ML injection Inject 44 Units into the skin at bedtime.       . Insulin Pen Needle 32G X 6 MM MISC Use with insulin pen for 4 injections daily  120 each  10  . Lancets (FREESTYLE) lancets PRN  100 each  5  . Multiple Vitamins-Minerals (MULTI FOR HIM) PACK Take 1 each by mouth daily.        . potassium chloride SA (K-DUR,KLOR-CON) 20 MEQ tablet Take 1 tablet (20 mEq total) by mouth daily.  90 tablet  1  . spironolactone (ALDACTONE) 50 MG tablet Take 1 tablet (50 mg total) by mouth daily.  90 tablet  1   Last reviewed on 10/22/2012  9:22 AM by [name], MD  PE: Blood pressure 167/82, pulse 79, temperature 98.2 F (36.8 C), temperature source Temporal, height 5\' 9"  (1.753 m), weight 219 lb (99.338 kg). Gen: Alert, well appearing.  Patient is oriented to person, place, time, and situation. FEET: right foot fissure-like plantar ulcer dry and with lots  of hard callous, without erythema or tenderness. Minimal erythema/pinkish hue to medial edge of foot--overall improving gradually on abx. Left foot with dry plantar ulcer under head of 5th metatarsal.  On dorsal surface directly opposite this is a new ulceration with necrotic tissue in the center.  Using sterile procedure I used a #10 scalpal and pickups to cut out the necrotic tissue from the dorsal ulceration---I got down to the level of an extensor tendon and stopped.  Pt tolerated this well--just felt a few jabs intermittently (without anesthesia).  Minimal bleeding.  No immediate complications.  Dressed wound: wet to dry dressing, sterile.  IMPRESSION AND PLAN:  DM foot ulcers New on on left foot dorsal surface, opposite the one on plantar surface. Worrisome for infection deep in soft tissues, also possible osteomyelitis. Will  obtain MRI left foot (plain films by wound care clinic a couple of weeks ago were normal). Add doxycycline 100mg  bid x 10d, continue/finish omnicef for recent cellulitis on right foot. Sent swab from new ulcer for c/s today.   Will obtain vascular testing done by [facility] recently.  I'm considering new referral to either an orthopedic surgeon or a vascular surgeon regarding his progressive foot ulcer problems.   An After Visit Summary was printed and given to the patient.  FOLLOW UP: 1 wk

## 2012-10-23 ENCOUNTER — Telehealth: Payer: Self-pay | Admitting: Family Medicine

## 2012-10-23 ENCOUNTER — Encounter: Payer: Self-pay | Admitting: Family Medicine

## 2012-10-23 ENCOUNTER — Encounter (HOSPITAL_BASED_OUTPATIENT_CLINIC_OR_DEPARTMENT_OTHER): Payer: Self-pay | Admitting: *Deleted

## 2012-10-23 DIAGNOSIS — E1169 Type 2 diabetes mellitus with other specified complication: Secondary | ICD-10-CM

## 2012-10-23 DIAGNOSIS — M869 Osteomyelitis, unspecified: Secondary | ICD-10-CM

## 2012-10-23 DIAGNOSIS — L97509 Non-pressure chronic ulcer of other part of unspecified foot with unspecified severity: Secondary | ICD-10-CM | POA: Insufficient documentation

## 2012-10-23 DIAGNOSIS — E11621 Type 2 diabetes mellitus with foot ulcer: Secondary | ICD-10-CM

## 2012-10-23 NOTE — Telephone Encounter (Signed)
Patient wanted to let Dr. [name] know he is going to have his left  big toe & the next toe cut off tomorrow.

## 2012-10-23 NOTE — Pre-Procedure Instructions (Signed)
Echos reviewed by Dr. Michelle Piper, pt. OK to come for surgery.

## 2012-10-24 ENCOUNTER — Encounter (HOSPITAL_BASED_OUTPATIENT_CLINIC_OR_DEPARTMENT_OTHER): Payer: Self-pay

## 2012-10-24 ENCOUNTER — Encounter (HOSPITAL_BASED_OUTPATIENT_CLINIC_OR_DEPARTMENT_OTHER): Payer: Self-pay | Admitting: *Deleted

## 2012-10-24 ENCOUNTER — Encounter (HOSPITAL_BASED_OUTPATIENT_CLINIC_OR_DEPARTMENT_OTHER)
Admission: RE | Disposition: A | Discharge status: Home / Self Care | Payer: Self-pay | Source: Ambulatory Visit | Attending: Orthopedic Surgery

## 2012-10-24 ENCOUNTER — Encounter (HOSPITAL_BASED_OUTPATIENT_CLINIC_OR_DEPARTMENT_OTHER): Payer: Managed Care, Other (non HMO)

## 2012-10-24 ENCOUNTER — Ambulatory Visit (HOSPITAL_BASED_OUTPATIENT_CLINIC_OR_DEPARTMENT_OTHER): Payer: Managed Care, Other (non HMO) | Admitting: *Deleted

## 2012-10-24 ENCOUNTER — Ambulatory Visit (HOSPITAL_BASED_OUTPATIENT_CLINIC_OR_DEPARTMENT_OTHER)
Admission: RE | Admit: 2012-10-24 | Discharge: 2012-10-24 | Disposition: A | Discharge status: Home / Self Care | Payer: Managed Care, Other (non HMO) | Source: Ambulatory Visit | Attending: Orthopedic Surgery | Admitting: Orthopedic Surgery

## 2012-10-24 DIAGNOSIS — I10 Essential (primary) hypertension: Secondary | ICD-10-CM | POA: Insufficient documentation

## 2012-10-24 DIAGNOSIS — M86679 Other chronic osteomyelitis, unspecified ankle and foot: Secondary | ICD-10-CM | POA: Insufficient documentation

## 2012-10-24 DIAGNOSIS — I8510 Secondary esophageal varices without bleeding: Secondary | ICD-10-CM | POA: Insufficient documentation

## 2012-10-24 DIAGNOSIS — Z794 Long term (current) use of insulin: Secondary | ICD-10-CM | POA: Insufficient documentation

## 2012-10-24 DIAGNOSIS — M9080 Osteopathy in diseases classified elsewhere, unspecified site: Secondary | ICD-10-CM | POA: Insufficient documentation

## 2012-10-24 DIAGNOSIS — E1169 Type 2 diabetes mellitus with other specified complication: Secondary | ICD-10-CM | POA: Insufficient documentation

## 2012-10-24 DIAGNOSIS — E11621 Type 2 diabetes mellitus with foot ulcer: Secondary | ICD-10-CM

## 2012-10-24 DIAGNOSIS — K7460 Unspecified cirrhosis of liver: Secondary | ICD-10-CM | POA: Insufficient documentation

## 2012-10-24 DIAGNOSIS — L97509 Non-pressure chronic ulcer of other part of unspecified foot with unspecified severity: Secondary | ICD-10-CM | POA: Insufficient documentation

## 2012-10-24 HISTORY — DX: Personal history of other diseases of urinary system: Z87.448

## 2012-10-24 HISTORY — PX: AMPUTATION: SHX166

## 2012-10-24 HISTORY — DX: Presence of dental prosthetic device (complete) (partial): Z97.2

## 2012-10-24 LAB — POCT I-STAT, CHEM 8
Hemoglobin: 11.6 g/dL — ABNORMAL LOW (ref 13.0–17.0)
Sodium: 135 mEq/L (ref 135–145)
TCO2: 23 mmol/L (ref 0–100)

## 2012-10-24 SURGERY — AMPUTATION, FOOT, RAY
Anesthesia: General | Site: Toe | Laterality: Left | Wound class: Dirty or Infected

## 2012-10-24 MED ORDER — LIDOCAINE HCL (CARDIAC) 20 MG/ML IV SOLN
INTRAVENOUS | Status: DC | PRN
  Administered 2012-10-24: 75 mg via INTRAVENOUS

## 2012-10-24 MED ORDER — HYDROMORPHONE HCL PF 1 MG/ML IJ SOLN: 0.2500 mg | INTRAMUSCULAR | Status: DC | PRN

## 2012-10-24 MED ORDER — OXYCODONE HCL 5 MG PO TABS: 5.0000 mg | ORAL_TABLET | Freq: Once | ORAL | Status: DC | PRN

## 2012-10-24 MED ORDER — PROPOFOL 10 MG/ML IV BOLUS
INTRAVENOUS | Status: DC | PRN
  Administered 2012-10-24 (×2): 100 mg via INTRAVENOUS

## 2012-10-24 MED ORDER — EPHEDRINE SULFATE 50 MG/ML IJ SOLN
INTRAMUSCULAR | Status: DC | PRN
  Administered 2012-10-24 (×2): 5 mg via INTRAVENOUS

## 2012-10-24 MED ORDER — PROPOFOL 10 MG/ML IV BOLUS: INTRAVENOUS | Status: DC | PRN

## 2012-10-24 MED ORDER — LACTATED RINGERS IV SOLN
INTRAVENOUS | Status: DC
  Administered 2012-10-24 (×2): via INTRAVENOUS

## 2012-10-24 MED ORDER — ONDANSETRON HCL 4 MG/2ML IJ SOLN
INTRAMUSCULAR | Status: DC | PRN
  Administered 2012-10-24: 4 mg via INTRAVENOUS

## 2012-10-24 MED ORDER — OXYCODONE HCL 5 MG/5ML PO SOLN: 5.0000 mg | Freq: Once | ORAL | Status: DC | PRN

## 2012-10-24 MED ORDER — CEFAZOLIN SODIUM-DEXTROSE 2-3 GM-% IV SOLR
INTRAVENOUS | Status: DC | PRN
  Administered 2012-10-24: 2 g via INTRAVENOUS

## 2012-10-24 MED ORDER — PROMETHAZINE HCL 25 MG/ML IJ SOLN: 6.2500 mg | INTRAMUSCULAR | Status: DC | PRN

## 2012-10-24 MED ORDER — INSULIN ASPART 100 UNIT/ML ~~LOC~~ SOLN
5.0000 [IU] | Freq: Once | SUBCUTANEOUS | Status: AC
  Administered 2012-10-24: 5 [IU] via SUBCUTANEOUS

## 2012-10-24 MED ORDER — FENTANYL CITRATE 0.05 MG/ML IJ SOLN
INTRAMUSCULAR | Status: DC | PRN
  Administered 2012-10-24: 50 ug via INTRAVENOUS

## 2012-10-24 MED ORDER — MEPERIDINE HCL 25 MG/ML IJ SOLN: 6.2500 mg | INTRAMUSCULAR | Status: DC | PRN

## 2012-10-24 MED ORDER — LIDOCAINE HCL (CARDIAC) 20 MG/ML IV SOLN: INTRAVENOUS | Status: DC | PRN

## 2012-10-24 MED ORDER — HYDROCODONE-ACETAMINOPHEN 5-325 MG PO TABS: 1.0000 | ORAL_TABLET | Freq: Four times a day (QID) | ORAL | Status: DC | PRN

## 2012-10-24 MED ORDER — FENTANYL CITRATE 0.05 MG/ML IJ SOLN: INTRAMUSCULAR | Status: DC | PRN

## 2012-10-24 SURGICAL SUPPLY — 54 items
BANDAGE ELASTIC 4 VELCRO ST LF (GAUZE/BANDAGES/DRESSINGS) ×2 IMPLANT
BLADE OSC/SAG .038X5.5 CUT EDG (BLADE) ×2 IMPLANT
BLADE SURG 11 STRL SS (BLADE) IMPLANT
BLADE SURG 15 STRL LF DISP TIS (BLADE) ×2 IMPLANT
BLADE SURG 15 STRL SS (BLADE) ×2
BRUSH SCRUB EZ PLAIN DRY (MISCELLANEOUS) ×2 IMPLANT
CAP PIN PROTECTOR ORTHO WHT (CAP) IMPLANT
CLOTH BEACON ORANGE TIMEOUT ST (SAFETY) ×2 IMPLANT
COVER TABLE BACK 60X90 (DRAPES) ×2 IMPLANT
CUFF TOURNIQUET SINGLE 34IN LL (TOURNIQUET CUFF) ×2 IMPLANT
DRAPE EXTREMITY T 121X128X90 (DRAPE) ×2 IMPLANT
DRAPE OEC MINIVIEW 54X84 (DRAPES) IMPLANT
DRAPE SURG 17X23 STRL (DRAPES) ×2 IMPLANT
DRSG PAD ABDOMINAL 8X10 ST (GAUZE/BANDAGES/DRESSINGS) IMPLANT
DURA STEPPER LG (CAST SUPPLIES) IMPLANT
DURA STEPPER MED (CAST SUPPLIES) IMPLANT
DURA STEPPER SML (CAST SUPPLIES) IMPLANT
ELECT REM PT RETURN 9FT ADLT (ELECTROSURGICAL) ×2
ELECTRODE REM PT RTRN 9FT ADLT (ELECTROSURGICAL) ×1 IMPLANT
GAUZE SPONGE 4X4 16PLY XRAY LF (GAUZE/BANDAGES/DRESSINGS) IMPLANT
GAUZE XEROFORM 1X8 LF (GAUZE/BANDAGES/DRESSINGS) ×2 IMPLANT
GLOVE BIO SURGEON STRL SZ8 (GLOVE) ×2 IMPLANT
GLOVE BIOGEL PI IND STRL 8 (GLOVE) ×2 IMPLANT
GLOVE BIOGEL PI INDICATOR 8 (GLOVE) ×2
GLOVE ECLIPSE 6.5 STRL STRAW (GLOVE) ×2 IMPLANT
GLOVE SURG SS PI 8.0 STRL IVOR (GLOVE) ×2 IMPLANT
GOWN BRE IMP PREV XXLGXLNG (GOWN DISPOSABLE) ×2 IMPLANT
GOWN PREVENTION PLUS XLARGE (GOWN DISPOSABLE) ×4 IMPLANT
KWIRE 4.0 X .045IN (WIRE) IMPLANT
NEEDLE HYPO 22GX1.5 SAFETY (NEEDLE) IMPLANT
NS IRRIG 1000ML POUR BTL (IV SOLUTION) ×2 IMPLANT
PACK BASIN DAY SURGERY FS (CUSTOM PROCEDURE TRAY) ×2 IMPLANT
PAD CAST 4YDX4 CTTN HI CHSV (CAST SUPPLIES) ×1 IMPLANT
PADDING CAST ABS 4INX4YD NS (CAST SUPPLIES)
PADDING CAST ABS COTTON 4X4 ST (CAST SUPPLIES) IMPLANT
PADDING CAST COTTON 4X4 STRL (CAST SUPPLIES) ×1
PENCIL BUTTON HOLSTER BLD 10FT (ELECTRODE) ×2 IMPLANT
SHEET MEDIUM DRAPE 40X70 STRL (DRAPES) ×4 IMPLANT
SPONGE GAUZE 4X4 12PLY (GAUZE/BANDAGES/DRESSINGS) ×2 IMPLANT
STOCKINETTE 6  STRL (DRAPES) ×1
STOCKINETTE 6 STRL (DRAPES) ×1 IMPLANT
SUCTION FRAZIER TIP 10 FR DISP (SUCTIONS) ×2 IMPLANT
SUT ETHILON 4 0 PS 2 18 (SUTURE) ×4 IMPLANT
SUT VIC AB 2-0 PS2 27 (SUTURE) IMPLANT
SUT VIC AB 3-0 FS2 27 (SUTURE) IMPLANT
SUT VIC AB 3-0 PS1 18 (SUTURE) ×1
SUT VIC AB 3-0 PS1 18XBRD (SUTURE) ×1 IMPLANT
SYR BULB 3OZ (MISCELLANEOUS) ×2 IMPLANT
SYR CONTROL 10ML LL (SYRINGE) IMPLANT
TOWEL OR 17X24 6PK STRL BLUE (TOWEL DISPOSABLE) ×2 IMPLANT
TOWEL OR NON WOVEN STRL DISP B (DISPOSABLE) ×2 IMPLANT
TUBE CONNECTING 20X1/4 (TUBING) ×2 IMPLANT
UNDERPAD 30X30 INCONTINENT (UNDERPADS AND DIAPERS) ×2 IMPLANT
WATER STERILE IRR 1000ML POUR (IV SOLUTION) IMPLANT

## 2012-10-24 NOTE — H&P (Signed)
  H&P documentation: Placed to be scanned history and physical exam in chart.  -History and Physical Reviewed  -Patient has been re-examined  -No change in the plan of care  [name]  

## 2012-10-24 NOTE — Anesthesia Procedure Notes (Signed)
Procedure Name: LMA Insertion Date/Time: 10/24/2012 4:33 PM Performed by: Meyer Russel Pre-anesthesia Checklist: Patient identified, Emergency Drugs available, Suction available and Patient being monitored Patient Re-evaluated:Patient Re-evaluated prior to inductionOxygen Delivery Method: Circle System Utilized Preoxygenation: Pre-oxygenation with 100% oxygen Intubation Type: IV induction Ventilation: Mask ventilation without difficulty LMA: LMA inserted LMA Size: 5.0 Number of attempts: 1 Airway Equipment and Method: bite block Placement Confirmation: positive ETCO2 and breath sounds checked- equal and bilateral Tube secured with: Tape Dental Injury: Teeth and Oropharynx as per pre-operative assessment

## 2012-10-24 NOTE — Brief Op Note (Signed)
10/24/2012  5:16 PM  PATIENT:  [name]  51 y.o. male  PRE-OPERATIVE DIAGNOSIS:  left 4th and 5th toe osteomylitis   POST-OPERATIVE DIAGNOSIS:  Left Fourth and Fifth toe Osteomyelitis  PROCEDURE:  Procedure(s) (LRB) with comments: AMPUTATION RAY (Left) - left 4th toe amputation through MTP joint, 5th Ray amputation   SURGEON:  Surgeon(s) and Role:    * Sherri Rad, MD - Primary  PHYSICIAN ASSISTANT: Rexene Edison, PAC   ASSISTANTS: Rexene Edison, [facility]    ANESTHESIA:   general  EBL:  Total I/O In: 1000 [I.V.:1000] Out: -   BLOOD ADMINISTERED:none  DRAINS: none   LOCAL MEDICATIONS USED:  NONE  SPECIMEN:  No Specimen  DISPOSITION OF SPECIMEN:  N/A  COUNTS:  YES  TOURNIQUET:  * No tourniquets in log *  DICTATION: .Other Dictation: Dictation Number [id number]  PLAN OF CARE: Discharge to home after PACU  PATIENT DISPOSITION:  PACU - hemodynamically stable.   Delay start of Pharmacological VTE agent (>24hrs) due to surgical blood loss or risk of bleeding: no

## 2012-10-24 NOTE — Anesthesia Postprocedure Evaluation (Signed)
  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

## 2012-10-24 NOTE — Transfer of Care (Signed)
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

## 2012-10-24 NOTE — Anesthesia Preprocedure Evaluation (Addendum)
Anesthesia Evaluation  Patient identified by MRN, date of birth, ID band Patient awake    Airway Mallampati: III  Neck ROM: Full    Dental  (+) Edentulous Upper   Pulmonary  breath sounds clear to auscultation        Cardiovascular hypertension, + Valvular Problems/Murmurs Rhythm:Regular Rate:Normal + Systolic murmurs    Neuro/Psych PSYCHIATRIC DISORDERS  Neuromuscular disease    GI/Hepatic negative GI ROS, (+) Cirrhosis -  Esophageal Varices     ,   Endo/Other  diabetes, Insulin Dependent  Renal/GU      Musculoskeletal   Abdominal   Peds  Hematology   Anesthesia Other Findings   Reproductive/Obstetrics                          Anesthesia Physical Anesthesia Plan  ASA: III  Anesthesia Plan: General   Post-op Pain Management:    Induction: Intravenous  Airway Management Planned: LMA  Additional Equipment:   Intra-op Plan:   Post-operative Plan: Extubation in OR  Informed Consent:   Plan Discussed with: CRNA and Surgeon  Anesthesia Plan Comments:         Anesthesia Quick Evaluation

## 2012-10-25 ENCOUNTER — Encounter (HOSPITAL_BASED_OUTPATIENT_CLINIC_OR_DEPARTMENT_OTHER): Payer: Self-pay | Admitting: Orthopedic Surgery

## 2012-10-25 LAB — WOUND CULTURE
Gram Stain: NONE SEEN
Gram Stain: NONE SEEN

## 2012-10-25 LAB — GLUCOSE, CAPILLARY: Glucose-Capillary: 248 mg/dL — ABNORMAL HIGH (ref 70–99)

## 2012-10-26 ENCOUNTER — Ambulatory Visit: Payer: Managed Care, Other (non HMO) | Admitting: Family Medicine

## 2012-10-29 NOTE — Op Note (Signed)
NAMEVIRGAL, WARMUTH NO.:  [id number]  MEDICAL RECORD NO.:  [id number]  LOCATION:                                 FACILITY:  PHYSICIAN:  Leonides Grills, M.D.     DATE OF BIRTH:  May 20, 1961  DATE OF PROCEDURE:  10/24/2012 DATE OF DISCHARGE:                              OPERATIVE REPORT   PREOPERATIVE DIAGNOSES: 1. Left 4th and 5th toe osteomyelitis. 2. Left 5th metatarsal osteomyelitis.  POSTOPERATIVE DIAGNOSES: 1. Left 4th and 5th toe osteomyelitis. 2. Left 5th metatarsal osteomyelitis.  OPERATION: 1. Left 4th toe amputation through MTP joint. 2. Left 5th ray amputation.  ANESTHESIA:  General.  SURGEON:  Leonides Grills, M.D.  ASSISTANT:  Richardean Canal, PA-C.  ESTIMATED BLOOD LOSS:  Minimal.  TOURNIQUET:  None.  COMPLICATIONS:  None.  DISPOSITION:  Stable to PR.  INDICATION:  This is a 51 year old diabetic who had a chronic ulcer.  He was evaluated and treated by his primary care physician.  They ordered an MRI, which showed osteomyelitis of his left 5th metatarsal distally and 4th toe proximal phalanx.  He was consented for the above procedure. All risks, infection, vessel injury, more proximal amputation, wound healing problems, persistent pain, worse pain, prolonged recovery, and Charcot arthropathy were all explained.  Questions were encouraged and answered.  OPERATIVE NOTE:  The patient was brought to the operating room and placed in supine position after adequate general anesthesia was administered as well as Ancef 2 g IV piggyback.  A bump was placed on left ipsilateral hip, internally rotating left lower extremity.  All bony prominences were well padded.  Left lower extremity was then prepped and draped in a sterile manner.  No tourniquet was used.  A longitudinal incision, full thickness, down to bone was made over the 5th metatarsal.  We then extended this around the 4th and 5th toes.  The 4th toe was then disarticulated at the MTP  joint and then removed.  The 5th toe was also removed at the MTP joint as well.  We then elevated the soft tissue around the 5th metatarsal.  Then an oblique osteotomy was then made through the 5th metatarsal shaft.  The remaining part of the 5th metatarsal shaft distally was then removed.  The area was copiously irrigated with normal saline.  There was no gross purulence.  The wound was very clean at the end of the procedure.  Once this was removed, we reconstructed the area with a 3-0 Vicryl stitch to bring in the edges and fill in the dead space.  We then closed the skin with 4-0 nylon stitch.  Sterile dressing was applied.  Hard-sole shoe was applied.  The patient was stable to the PR.     Leonides Grills, M.D.     PB/MEDQ  D:  10/24/2012  T:  10/25/2012  Job:  [id number]

## 2012-11-02 ENCOUNTER — Ambulatory Visit: Payer: Managed Care, Other (non HMO) | Admitting: Family Medicine

## 2012-11-14 ENCOUNTER — Encounter (HOSPITAL_BASED_OUTPATIENT_CLINIC_OR_DEPARTMENT_OTHER): Payer: Managed Care, Other (non HMO)

## 2012-11-29 ENCOUNTER — Ambulatory Visit: Payer: Managed Care, Other (non HMO) | Admitting: Family Medicine

## 2012-12-14 ENCOUNTER — Encounter: Payer: Self-pay | Admitting: Family Medicine

## 2012-12-18 ENCOUNTER — Encounter (HOSPITAL_BASED_OUTPATIENT_CLINIC_OR_DEPARTMENT_OTHER): Payer: Self-pay

## 2012-12-18 ENCOUNTER — Inpatient Hospital Stay (HOSPITAL_BASED_OUTPATIENT_CLINIC_OR_DEPARTMENT_OTHER)
Admission: EM | Admit: 2012-12-18 | Discharge: 2012-12-21 | DRG: 638 | Disposition: A | Discharge status: Home Health | Payer: Managed Care, Other (non HMO) | Attending: Internal Medicine | Admitting: Internal Medicine

## 2012-12-18 ENCOUNTER — Emergency Department (HOSPITAL_BASED_OUTPATIENT_CLINIC_OR_DEPARTMENT_OTHER): Payer: Managed Care, Other (non HMO)

## 2012-12-18 DIAGNOSIS — I10 Essential (primary) hypertension: Secondary | ICD-10-CM

## 2012-12-18 DIAGNOSIS — Z79899 Other long term (current) drug therapy: Secondary | ICD-10-CM

## 2012-12-18 DIAGNOSIS — E1165 Type 2 diabetes mellitus with hyperglycemia: Principal | ICD-10-CM | POA: Diagnosis present

## 2012-12-18 DIAGNOSIS — M869 Osteomyelitis, unspecified: Secondary | ICD-10-CM

## 2012-12-18 DIAGNOSIS — E1169 Type 2 diabetes mellitus with other specified complication: Secondary | ICD-10-CM

## 2012-12-18 DIAGNOSIS — K7460 Unspecified cirrhosis of liver: Secondary | ICD-10-CM

## 2012-12-18 DIAGNOSIS — E11621 Type 2 diabetes mellitus with foot ulcer: Secondary | ICD-10-CM

## 2012-12-18 DIAGNOSIS — E1159 Type 2 diabetes mellitus with other circulatory complications: Secondary | ICD-10-CM | POA: Diagnosis present

## 2012-12-18 DIAGNOSIS — M47817 Spondylosis without myelopathy or radiculopathy, lumbosacral region: Secondary | ICD-10-CM | POA: Diagnosis present

## 2012-12-18 DIAGNOSIS — D696 Thrombocytopenia, unspecified: Secondary | ICD-10-CM

## 2012-12-18 DIAGNOSIS — L02619 Cutaneous abscess of unspecified foot: Secondary | ICD-10-CM | POA: Diagnosis present

## 2012-12-18 DIAGNOSIS — IMO0002 Reserved for concepts with insufficient information to code with codable children: Principal | ICD-10-CM

## 2012-12-18 DIAGNOSIS — M9080 Osteopathy in diseases classified elsewhere, unspecified site: Secondary | ICD-10-CM | POA: Diagnosis present

## 2012-12-18 DIAGNOSIS — G629 Polyneuropathy, unspecified: Secondary | ICD-10-CM

## 2012-12-18 DIAGNOSIS — Q211 Atrial septal defect: Secondary | ICD-10-CM

## 2012-12-18 DIAGNOSIS — E118 Type 2 diabetes mellitus with unspecified complications: Secondary | ICD-10-CM

## 2012-12-18 DIAGNOSIS — I798 Other disorders of arteries, arterioles and capillaries in diseases classified elsewhere: Secondary | ICD-10-CM | POA: Diagnosis present

## 2012-12-18 DIAGNOSIS — Q2111 Secundum atrial septal defect: Secondary | ICD-10-CM

## 2012-12-18 DIAGNOSIS — K7030 Alcoholic cirrhosis of liver without ascites: Secondary | ICD-10-CM | POA: Diagnosis present

## 2012-12-18 DIAGNOSIS — Q231 Congenital insufficiency of aortic valve: Secondary | ICD-10-CM

## 2012-12-18 DIAGNOSIS — L0390 Cellulitis, unspecified: Secondary | ICD-10-CM

## 2012-12-18 DIAGNOSIS — E1142 Type 2 diabetes mellitus with diabetic polyneuropathy: Secondary | ICD-10-CM | POA: Diagnosis present

## 2012-12-18 DIAGNOSIS — E1149 Type 2 diabetes mellitus with other diabetic neurological complication: Secondary | ICD-10-CM | POA: Diagnosis present

## 2012-12-18 DIAGNOSIS — F1020 Alcohol dependence, uncomplicated: Secondary | ICD-10-CM | POA: Diagnosis present

## 2012-12-18 DIAGNOSIS — Z794 Long term (current) use of insulin: Secondary | ICD-10-CM

## 2012-12-18 DIAGNOSIS — K766 Portal hypertension: Secondary | ICD-10-CM | POA: Diagnosis present

## 2012-12-18 DIAGNOSIS — L97509 Non-pressure chronic ulcer of other part of unspecified foot with unspecified severity: Secondary | ICD-10-CM | POA: Diagnosis present

## 2012-12-18 LAB — URINALYSIS, ROUTINE W REFLEX MICROSCOPIC
Glucose, UA: 100 mg/dL — AB
Ketones, ur: NEGATIVE mg/dL
Leukocytes, UA: NEGATIVE
Protein, ur: >300 mg/dL — AB
pH: 5 (ref 5.0–8.0)

## 2012-12-18 LAB — URINE MICROSCOPIC-ADD ON

## 2012-12-18 NOTE — ED Notes (Signed)
Pain to left knee and leg x 4 days-2 left toes amputated 10/24/12

## 2012-12-18 NOTE — ED Notes (Signed)
Discussed case with Keenan Bachelor PA-will order doppler study

## 2012-12-19 ENCOUNTER — Encounter (HOSPITAL_BASED_OUTPATIENT_CLINIC_OR_DEPARTMENT_OTHER): Payer: Self-pay | Admitting: Emergency Medicine

## 2012-12-19 DIAGNOSIS — L0291 Cutaneous abscess, unspecified: Secondary | ICD-10-CM

## 2012-12-19 DIAGNOSIS — L97509 Non-pressure chronic ulcer of other part of unspecified foot with unspecified severity: Secondary | ICD-10-CM

## 2012-12-19 DIAGNOSIS — E1169 Type 2 diabetes mellitus with other specified complication: Secondary | ICD-10-CM

## 2012-12-19 LAB — BASIC METABOLIC PANEL
BUN: 21 mg/dL (ref 6–23)
BUN: 19 mg/dL (ref 6–23)
CO2: 27 mEq/L (ref 19–32)
CO2: 26 mEq/L (ref 19–32)
Calcium: 8.3 mg/dL — ABNORMAL LOW (ref 8.4–10.5)
Chloride: 103 mEq/L (ref 96–112)
Chloride: 104 mEq/L (ref 96–112)
Creatinine, Ser: 0.8 mg/dL (ref 0.50–1.35)
Creatinine, Ser: 0.76 mg/dL (ref 0.50–1.35)
Glucose, Bld: 217 mg/dL — ABNORMAL HIGH (ref 70–99)
Potassium: 3.6 mEq/L (ref 3.5–5.1)

## 2012-12-19 LAB — CBC WITH DIFFERENTIAL/PLATELET
Basophils Relative: 1 % (ref 0–1)
Eosinophils Absolute: 0.1 10*3/uL (ref 0.0–0.7)
Eosinophils Relative: 3 % (ref 0–5)
Hemoglobin: 10.2 g/dL — ABNORMAL LOW (ref 13.0–17.0)
Lymphs Abs: 0.5 10*3/uL — ABNORMAL LOW (ref 0.7–4.0)
MCH: 29.6 pg (ref 26.0–34.0)
MCHC: 34 g/dL (ref 30.0–36.0)
MCV: 87 fL (ref 78.0–100.0)
Monocytes Absolute: 0.6 10*3/uL (ref 0.1–1.0)
Neutrophils Relative %: 68 % (ref 43–77)
RBC: 3.45 MIL/uL — ABNORMAL LOW (ref 4.22–5.81)

## 2012-12-19 LAB — GLUCOSE, CAPILLARY
Glucose-Capillary: 213 mg/dL — ABNORMAL HIGH (ref 70–99)
Glucose-Capillary: 224 mg/dL — ABNORMAL HIGH (ref 70–99)

## 2012-12-19 MED ORDER — INSULIN ASPART 100 UNIT/ML ~~LOC~~ SOLN
0.0000 [IU] | Freq: Every day | SUBCUTANEOUS | Status: DC
  Administered 2012-12-19: 2 [IU] via SUBCUTANEOUS
  Administered 2012-12-20: 3 [IU] via SUBCUTANEOUS

## 2012-12-19 MED ORDER — HYDROCODONE-ACETAMINOPHEN 5-325 MG PO TABS: 1.0000 | ORAL_TABLET | Freq: Four times a day (QID) | ORAL | Status: DC | PRN

## 2012-12-19 MED ORDER — ONDANSETRON HCL 4 MG/2ML IJ SOLN: 4.0000 mg | Freq: Four times a day (QID) | INTRAMUSCULAR | Status: DC | PRN

## 2012-12-19 MED ORDER — ONDANSETRON HCL 4 MG PO TABS: 4.0000 mg | ORAL_TABLET | Freq: Four times a day (QID) | ORAL | Status: DC | PRN

## 2012-12-19 MED ORDER — SODIUM CHLORIDE 0.9 % IV SOLN
Freq: Once | INTRAVENOUS | Status: AC
  Administered 2012-12-19: 02:00:00 via INTRAVENOUS

## 2012-12-19 MED ORDER — PIPERACILLIN-TAZOBACTAM 3.375 G IVPB 30 MIN
3.3750 g | Freq: Once | INTRAVENOUS | Status: AC
  Administered 2012-12-19: 3.375 g via INTRAVENOUS
  Filled 2012-12-19 (×2): qty 50

## 2012-12-19 MED ORDER — SODIUM CHLORIDE 0.9 % IJ SOLN: 3.0000 mL | INTRAMUSCULAR | Status: DC | PRN

## 2012-12-19 MED ORDER — VANCOMYCIN HCL IN DEXTROSE 1-5 GM/200ML-% IV SOLN
1000.0000 mg | Freq: Once | INTRAVENOUS | Status: AC
  Administered 2012-12-19: 1000 mg via INTRAVENOUS
  Filled 2012-12-19: qty 200

## 2012-12-19 MED ORDER — ACETAMINOPHEN 325 MG PO TABS: 650.0000 mg | ORAL_TABLET | Freq: Four times a day (QID) | ORAL | Status: DC | PRN

## 2012-12-19 MED ORDER — SENNOSIDES-DOCUSATE SODIUM 8.6-50 MG PO TABS: 1.0000 | ORAL_TABLET | Freq: Every evening | ORAL | Status: DC | PRN

## 2012-12-19 MED ORDER — INSULIN GLARGINE 100 UNIT/ML ~~LOC~~ SOLN
35.0000 [IU] | Freq: Every day | SUBCUTANEOUS | Status: DC
  Administered 2012-12-19 – 2012-12-20 (×2): 35 [IU] via SUBCUTANEOUS

## 2012-12-19 MED ORDER — ACETAMINOPHEN 650 MG RE SUPP: 650.0000 mg | Freq: Four times a day (QID) | RECTAL | Status: DC | PRN

## 2012-12-19 MED ORDER — ENOXAPARIN SODIUM 40 MG/0.4ML ~~LOC~~ SOLN: 40.0000 mg | SUBCUTANEOUS | Status: DC

## 2012-12-19 MED ORDER — FUROSEMIDE 40 MG PO TABS
40.0000 mg | ORAL_TABLET | Freq: Every day | ORAL | Status: DC
  Administered 2012-12-19 – 2012-12-21 (×3): 40 mg via ORAL
  Filled 2012-12-19 (×3): qty 1

## 2012-12-19 MED ORDER — VANCOMYCIN HCL IN DEXTROSE 1-5 GM/200ML-% IV SOLN
1000.0000 mg | Freq: Three times a day (TID) | INTRAVENOUS | Status: DC
  Administered 2012-12-19 – 2012-12-21 (×7): 1000 mg via INTRAVENOUS
  Filled 2012-12-19 (×9): qty 200

## 2012-12-19 MED ORDER — PIPERACILLIN-TAZOBACTAM 3.375 G IVPB
3.3750 g | Freq: Three times a day (TID) | INTRAVENOUS | Status: DC
  Administered 2012-12-19 – 2012-12-20 (×5): 3.375 g via INTRAVENOUS
  Filled 2012-12-19 (×8): qty 50

## 2012-12-19 MED ORDER — GABAPENTIN 300 MG PO CAPS
300.0000 mg | ORAL_CAPSULE | Freq: Three times a day (TID) | ORAL | Status: DC
  Administered 2012-12-19: 300 mg via ORAL
  Filled 2012-12-19 (×6): qty 1

## 2012-12-19 MED ORDER — POTASSIUM CHLORIDE CRYS ER 20 MEQ PO TBCR
40.0000 meq | EXTENDED_RELEASE_TABLET | Freq: Once | ORAL | Status: DC
  Filled 2012-12-19: qty 2

## 2012-12-19 MED ORDER — INSULIN REGULAR HUMAN 100 UNIT/ML IJ SOLN
3.0000 [IU] | Freq: Three times a day (TID) | INTRAMUSCULAR | Status: DC
  Administered 2012-12-20: 3 [IU] via INTRAVENOUS
  Filled 2012-12-19 (×11): qty 0.03

## 2012-12-19 MED ORDER — INSULIN ASPART 100 UNIT/ML ~~LOC~~ SOLN
0.0000 [IU] | Freq: Three times a day (TID) | SUBCUTANEOUS | Status: DC
  Administered 2012-12-19: 3 [IU] via SUBCUTANEOUS
  Administered 2012-12-19 (×2): 5 [IU] via SUBCUTANEOUS
  Administered 2012-12-20: 3 [IU] via SUBCUTANEOUS
  Administered 2012-12-20: 2 [IU] via SUBCUTANEOUS
  Administered 2012-12-21 (×2): 3 [IU] via SUBCUTANEOUS

## 2012-12-19 NOTE — ED Provider Notes (Signed)
History     CSN: [id number]  Arrival date & time 12/18/12  1956   First MD Initiated Contact with Patient 12/18/12 2301      Chief Complaint  Patient presents with  . Knee Pain    (Consider location/radiation/quality/duration/timing/severity/associated sxs/prior treatment) Patient is a 52 y.o. male presenting with knee pain. The history is provided by the patient.  Knee Pain This is a new problem. The current episode started 12 to 24 hours ago. The problem occurs constantly. The problem has not changed since onset.Pertinent negatives include no chest pain, no abdominal pain, no headaches and no shortness of breath. Nothing aggravates the symptoms. Nothing relieves the symptoms. He has tried nothing for the symptoms.    Past Medical History  Diagnosis Date  . Heart murmur     ECHO 07/2011 showed mild AS and LVH.  TEE 09/2011 showed small PFO, bicuspid aortic valve but no stenosis or regurg.  . 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  F/u u/s for ongoing/worsening thrombocytopenia showed no more splenomegaly.  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  Gen surg and GI recommended flagyl and cipro x 2 wks..  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  . Cholelithiasis 09/2011  . Alcoholism   . Peripheral neuropathy 2012    Diabetic:  Autonomic (pelvic) and LE polyneuropathy--[facility] testing showed that it is likely from DM  . 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 Arthritis     back L3 - L5  . Hyperactive gag reflex   . History of pyelonephritis 09/2011  . Diabetes mellitus     IDDM  . Wears dentures     upper  . Osteomyelitis of toe of left foot 10/22/12    left 4th and 5th toes; states left 5th toe is open  wound    Past Surgical History  Procedure Date  . Knee arthroscopy 2001    Bilateral  . Amputation 10/24/2012    Procedure: AMPUTATION RAY;  Surgeon: Sherri Rad, MD;  Location: [facility];  Service: Orthopedics;  Laterality: Left;  left 4th toe amputation through MTP joint, 5th Ray amputation     Family History  Problem Relation Age of Onset  . Heart disease Father     History  Substance Use Topics  . Smoking status: Never Smoker   . Smokeless tobacco: Former Neurosurgeon    Types: Chew  . Alcohol Use: No     Comment: Quit drinking alcohol 08/20/2011      Review of Systems  Constitutional: Positive for fever.  Respiratory: Negative for shortness of breath.   Cardiovascular: Negative for chest pain.  Gastrointestinal: Negative for abdominal pain.  Neurological: Negative for headaches.  All other systems reviewed and are negative.    Allergies  Naproxen and Other  Home Medications   Current Outpatient Rx  Name  Route  Sig  Dispense  Refill  . CEFDINIR 300 MG PO CAPS   Oral   Take 1 capsule (300 mg total) by mouth 2 (two) times daily.   20 capsule   0   . DOXYCYCLINE MONOHYDRATE 100 MG PO CAPS   Oral   Take 1 capsule (100 mg total) by mouth 2 (two)  times daily.   20 capsule   0   . MEDICAL COMPRESSION STOCKINGS MISC   Does not apply   1 Units by Does not apply route daily as needed. Apply to lower extremities daily as needed for lower extremity swelling   1 each   3   . FLUTICASONE PROPIONATE 0.05 % EX CREA      Apply to lower legs rash bid prn   30 g   2   . FUROSEMIDE 40 MG PO TABS   Oral   Take 1 tablet (40 mg total) by mouth daily.   90 tablet   1   . GABAPENTIN 300 MG PO CAPS   Oral   Take 1 capsule (300 mg total) by mouth 3 (three) times daily. increase to 1 capsule three times per day as directed.   270 capsule   3   . GLUCAGON (RDNA) 1 MG IJ KIT      Follow package directions for low blood sugar (for emergency use for  symptomatic hypoglycemia)   1 each   1   . GLUCOSE BLOOD VI STRP      PRN   32 each   12     Check fasting glucose every day   . HYDROCODONE-ACETAMINOPHEN 5-325 MG PO TABS   Oral   Take 1 tablet by mouth every 6 (six) hours as needed for pain. One to two tabs every 4-6 hours for pain   30 tablet   0   . INSULIN ASPART 100 UNIT/ML [place] SOLN      3 (three) times daily before meals. Base of 12 qAC BF, 12 qAC lunch, and 12 qAC supper plus sliding scale         . INSULIN GLARGINE 100 UNIT/ML [place] SOLN   Subcutaneous   Inject 42 Units into the skin at bedtime.          . INSULIN PEN NEEDLE 32G X 6 MM MISC      Use with insulin pen for 4 injections daily   120 each   10   . FREESTYLE LANCETS MISC      PRN   100 each   5   . MULTI FOR HIM PO PACK   Oral   Take 1 each by mouth daily.             BP 158/68  Pulse 83  Temp 98.7 F (37.1 C) (Oral)  Resp 16  Ht 5\' 10"  (1.778 m)  Wt 210 lb (95.255 kg)  BMI 30.13 kg/m2  SpO2 98%  Physical Exam  Constitutional: He is oriented to person, place, and time. He appears well-developed and well-nourished. No distress.  HENT:  Head: Normocephalic and atraumatic.  Mouth/Throat: Oropharynx is clear and moist.  Eyes: Conjunctivae normal are normal. Pupils are equal, round, and reactive to light.  Neck: Normal range of motion. Neck supple.  Cardiovascular: Normal rate, regular rhythm and intact distal pulses.   Pulmonary/Chest: Effort normal and breath sounds normal.  Abdominal: Soft. Bowel sounds are normal. There is no tenderness. There is no rebound and no guarding.  Musculoskeletal: Normal range of motion.       Negative anterior and posterior drawer test  Neurological: He is alert and oriented to person, place, and time. He has normal reflexes.  Skin: Skin is warm and dry. There is erythema.  Psychiatric: He has a normal mood and affect.    ED Course  Procedures (including critical care time)  Labs  Reviewed  CBC  WITH DIFFERENTIAL - Abnormal; Notable for the following:    RBC 3.45 (*)     Hemoglobin 10.2 (*)     HCT 30.0 (*)     All other components within normal limits  URINALYSIS, ROUTINE W REFLEX MICROSCOPIC - Abnormal; Notable for the following:    Color, Urine AMBER (*)  BIOCHEMICALS MAY BE AFFECTED BY COLOR   APPearance CLOUDY (*)     Glucose, UA 100 (*)     Hgb urine dipstick LARGE (*)     Bilirubin Urine SMALL (*)     Protein, ur >300 (*)     All other components within normal limits  URINE MICROSCOPIC-ADD ON - Abnormal; Notable for the following:    Casts GRANULAR CAST (*)  HYALINE CASTS   All other components within normal limits  BASIC METABOLIC PANEL  CULTURE, BLOOD (ROUTINE X 2)  CULTURE, BLOOD (ROUTINE X 2)   Dg Tibia/fibula Left  12/19/2012  *RADIOLOGY REPORT*  Clinical Data: Knee pain  LEFT TIBIA AND FIBULA - 2 VIEW  Comparison: None.  Findings: No osseous abnormality of the left tibia or fibula.  No soft tissue abnormality.  Knee joint and ankle joint demonstrate mild degenerative change.  IMPRESSION: No acute osseous abnormality.   Original Report Authenticated By: Genevive Bi, M.D.    US Venous Img Lower Unilateral Left  12/18/2012  *RADIOLOGY REPORT*  Clinical Data: Left knee pain and lower leg pain, swelling and redness  LEFT LOWER EXTREMITY VENOUS DUPLEX ULTRASOUND  Technique:  Gray-scale sonography with graded compression, as well as color Doppler and duplex ultrasound were performed to evaluate the deep venous system of the lower extremity from the level of the common femoral vein through the popliteal and proximal calf veins. Spectral Doppler was utilized to evaluate flow at rest and with distal augmentation maneuvers.  Comparison:  None.  Findings:  Normal compressibility of the common femoral, superficial femoral, and popliteal veins is demonstrated, as well as the visualized proximal calf veins.  No filling defects to suggest DVT on grayscale or color Doppler imaging.   Doppler waveforms show normal direction of venous flow, normal respiratory phasicity and response to augmentation.  IMPRESSION: No evidence of lower extremity deep vein thrombosis.   Original Report Authenticated By: Genevive Bi, M.D.    Dg Knee Complete 4 Views Left  12/19/2012  *RADIOLOGY REPORT*  Clinical Data: Sudden onset left knee pain  LEFT KNEE - COMPLETE 4+ VIEW  Comparison: None  Findings: No fracture or dislocation of the left knee.  No joint effusion.  There is mild spurring of the medial and lateral compartment as well as the patellofemoral compartment.  IMPRESSION:  1.  No acute findings left knee. 2.  Mild tricompartment osteoarthritis.   Original Report Authenticated By: Genevive Bi, M.D.      No diagnosis found.    MDM  Cellulitis.  Will admit given recent surgery        Pocahontas Cohenour K Catrell Morrone-Rasch, MD 12/19/12 7829

## 2012-12-19 NOTE — ED Notes (Signed)
Report called to [facility] RN [id number]

## 2012-12-19 NOTE — Progress Notes (Signed)
ANTIBIOTIC CONSULT NOTE - INITIAL  Pharmacy Consult for Vancomycin and Zosyn  Indication: cellulitis  Allergies  Allergen Reactions  . Naproxen Nausea And Vomiting  . Other Itching and Other (See Comments)    PERFUMES - SNEEZING    Patient Measurements: Height: 5\' 10"  (177.8 cm) Weight: 210 lb (95.255 kg) IBW/kg (Calculated) : 73   Vital Signs: Temp: 98.7 F (37.1 C) (12/31 2008) Temp src: Oral (12/31 2008) BP: 158/68 mmHg (12/31 2008) Pulse Rate: 83  (12/31 2008)  Labs:  [facility] 12/19/12 0116  WBC 4.0  HGB 10.2*  PLT 76*  LABCREA --  CREATININE 0.80   Estimated Creatinine Clearance: 128 ml/min (by C-G formula based on Cr of 0.8). No results found for this basename: VANCOTROUGH:2,VANCOPEAK:2,VANCORANDOM:2,GENTTROUGH:2,GENTPEAK:2,GENTRANDOM:2,TOBRATROUGH:2,TOBRAPEAK:2,TOBRARND:2,AMIKACINPEAK:2,AMIKACINTROU:2,AMIKACIN:2, in the last 72 hours   Microbiology: No results found for this or any previous visit (from the past 720 hour(s)).  Medical History: Past Medical History  Diagnosis Date  . Heart murmur     ECHO 07/2011 showed mild AS and LVH.  TEE 09/2011 showed small PFO, bicuspid aortic valve but no stenosis or regurg.  . 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  F/u u/s for ongoing/worsening thrombocytopenia showed no more splenomegaly.  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  Gen surg and GI recommended flagyl and cipro x 2 wks..  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  . Cholelithiasis 09/2011  . Alcoholism   . Peripheral neuropathy 2012    Diabetic:  Autonomic (pelvic) and LE polyneuropathy--[facility] testing showed that it is likely from DM  . 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  Arthritis     back L3 - L5  . Hyperactive gag reflex   . History of pyelonephritis 09/2011  . Diabetes mellitus     IDDM  . Wears dentures     upper  . Osteomyelitis of toe of left foot 10/22/12    left 4th and 5th toes; states left 5th toe is open wound    Medications:  Prescriptions prior to admission  Medication Sig Dispense Refill  . cefdinir (OMNICEF) 300 MG capsule Take 1 capsule (300 mg total) by mouth 2 (two) times daily.  20 capsule  0  . doxycycline (MONODOX) 100 MG capsule Take 1 capsule (100 mg total) by mouth 2 (two) times daily.  20 capsule  0  . Elastic Bandages & Supports (MEDICAL COMPRESSION STOCKINGS) MISC 1 Units by Does not apply route daily as needed. Apply to lower extremities daily as needed for lower extremity swelling  1 each  3  . fluticasone (CUTIVATE) 0.05 % cream Apply to lower legs rash bid prn  30 g  2  . furosemide (LASIX) 40 MG tablet Take 1 tablet (40 mg total) by mouth daily.  90 tablet  1  . gabapentin (NEURONTIN) 300 MG capsule Take 1 capsule (300 mg total) by mouth 3 (three) times daily. increase to 1 capsule three times per day as directed.  270 capsule  3  . glucagon 1 MG injection Follow package directions for low blood sugar (for emergency use for symptomatic hypoglycemia)  1 each  1  . glucose blood (FREESTYLE LITE) test strip PRN  32 each  12  . HYDROcodone-acetaminophen (  NORCO) 5-325 MG per tablet Take 1 tablet by mouth every 6 (six) hours as needed for pain. One to two tabs every 4-6 hours for pain  30 tablet  0  . insulin aspart (NOVOLOG) 100 UNIT/ML injection 3 (three) times daily before meals. Base of 12 qAC BF, 12 qAC lunch, and 12 qAC supper plus sliding scale      . insulin glargine (LANTUS) 100 UNIT/ML injection Inject 42 Units into the skin at bedtime.       . Insulin Pen Needle 32G X 6 MM MISC Use with insulin pen for 4 injections daily  120 each  10  . Lancets (FREESTYLE) lancets PRN  100 each  5  . Multiple Vitamins-Minerals (MULTI  FOR HIM) PACK Take 1 each by mouth daily.         Assessment: 52 yo male with diabetic foot wound for empiric antibiotics.  Zosyn 3.375 g and vancomycin 1 g IV given at [facility] at 0220  Goal of Therapy:  Vancomycin trough 10-15  Plan:  Vancomycin 1 g IV q8h Zosyn 3.375 g IV q8h  Yasira Engelson, Gary Fleet 12/19/2012,5:20 AM

## 2012-12-19 NOTE — H&P (Addendum)
PCP:   [name], MD  Surgeon: Dr Lovie Chol  Chief Complaint:  Swelling left foot  HPI: This is a 52 y/o male who underwent a left 4rth and 5th metatarsal amputation on 11/06 d/t diabetic foot ulcer and osteomyelitis. He states he has been doing well, he had a f/u on the 16th and had no issues. The patient states he has been cleaning the incision site with no issues. On Sunday he had an episode of chills, fever, nausea and vomiting. This was treated and resolved with no issues. Today in the PM he stood and noted pain in the left leg. He took off his pants and noted swelling in the left LE. He went to the [facility] ER. He was diagnosed with diabetic foot ulcer and transferred to [facility]. History provided by patient who is alert and oriented.  Review of Systems:  The patient denies anorexia, fever, weight loss,, vision loss, decreased hearing, hoarseness, chest pain, syncope, dyspnea on exertion, peripheral edema, balance deficits, hemoptysis, abdominal pain, melena, hematochezia, severe indigestion/heartburn, hematuria, incontinence, genital sores, muscle weakness, suspicious skin lesions, transient blindness, difficulty walking, depression, unusual weight change, abnormal bleeding, enlarged lymph nodes, angioedema, and breast masses.  Past Medical History: Past Medical History  Diagnosis Date  . Heart murmur     ECHO 07/2011 showed mild AS and LVH.  TEE 09/2011 showed small PFO, bicuspid aortic valve but no stenosis or regurg.  . 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  F/u u/s for ongoing/worsening thrombocytopenia showed no more splenomegaly.  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  Gen surg and GI recommended flagyl and cipro x 2 wks..  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  . Cholelithiasis 09/2011  . Alcoholism   . Peripheral neuropathy 2012    Diabetic:  Autonomic (pelvic) and LE polyneuropathy--[facility] testing showed that it is likely from DM  . 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 Arthritis     back L3 - L5  . Hyperactive gag reflex   . History of pyelonephritis 09/2011  . Diabetes mellitus     IDDM  . Wears dentures     upper  . Osteomyelitis of toe of left foot 10/22/12    left 4th and 5th toes; states left 5th toe is open wound   Past Surgical History  Procedure Date  . Knee arthroscopy 2001    Bilateral  . Amputation 10/24/2012    Procedure: AMPUTATION RAY;  Surgeon: Sherri Rad, MD;  Location: [facility];  Service: Orthopedics;  Laterality: Left;  left 4th toe amputation through MTP joint, 5th Ray amputation     Medications: Prior to Admission medications   Medication Sig Start Date End Date Taking? Authorizing Provider  cefdinir (OMNICEF) 300 MG capsule Take 1 capsule (300 mg total) by mouth 2 (two) times daily. 10/16/12   [name], MD  doxycycline (MONODOX) 100 MG capsule Take 1 capsule (100 mg total) by mouth 2 (two) times daily. 10/22/12   [name], MD  Elastic Bandages & Supports (MEDICAL COMPRESSION STOCKINGS) MISC 1 Units by Does not apply route daily as needed. Apply to lower extremities daily as needed for lower extremity swelling 10/10/11  [name], MD  fluticasone (CUTIVATE) 0.05 % cream Apply to lower legs rash bid prn 10/19/12   [name], MD  furosemide (LASIX) 40 MG tablet Take 1 tablet (40 mg total) by mouth daily. 10/19/12   [name], MD  gabapentin (NEURONTIN) 300 MG capsule Take 1 capsule (300 mg total) by mouth 3 (three) times daily. increase to 1 capsule three times per day as directed. 08/10/12   Milas Gain, MD  glucagon 1 MG injection Follow package directions for low blood sugar (for  emergency use for symptomatic hypoglycemia) 11/07/11 11/06/12  [name], MD  glucose blood (FREESTYLE LITE) test strip PRN 07/14/11   [name], MD  HYDROcodone-acetaminophen (NORCO) 5-325 MG per tablet Take 1 tablet by mouth every 6 (six) hours as needed for pain. One to two tabs every 4-6 hours for pain 10/24/12   Richardean Canal, PA  insulin aspart (NOVOLOG) 100 UNIT/ML injection 3 (three) times daily before meals. Base of 12 qAC BF, 12 qAC lunch, and 12 qAC supper plus sliding scale 08/03/12   [name], MD  insulin glargine (LANTUS) 100 UNIT/ML injection Inject 42 Units into the skin at bedtime.  08/03/12   [name], MD  Insulin Pen Needle 32G X 6 MM MISC Use with insulin pen for 4 injections daily 10/10/11   [name], MD  Lancets (FREESTYLE) lancets PRN 07/14/11   [name], MD  Multiple Vitamins-Minerals [facility] FOR HIM) PACK Take 1 each by mouth daily.      Historical Provider, MD    Allergies:   Allergies  Allergen Reactions  . Naproxen Nausea And Vomiting  . Other Itching and Other (See Comments)    PERFUMES - SNEEZING    Social History:  reports that he has never smoked. He has quit using smokeless tobacco. His smokeless tobacco use included Chew. He reports that he does not drink alcohol or use illicit drugs. no alcohol in almost 2 yrs  Family History: Family History  Problem Relation Age of Onset  . Heart disease Father     Physical Exam: Filed Vitals:   12/18/12 2008  BP: 158/68  Pulse: 83  Temp: 98.7 F (37.1 C)  TempSrc: Oral  Resp: 16  Height: 5\' 10"  (1.778 m)  Weight: 95.255 kg (210 lb)  SpO2: 98%    General:  Alert and oriented times three, well developed and nourished, no acute distress Eyes: PERRLA, pink conjunctiva, no scleral icterus ENT: Moist oral mucosa, neck supple, no thyromegaly Lungs: clear to ascultation, no wheeze, no crackles, no use of accessory muscles Cardiovascular: regular rate and rhythm, no  regurgitation, no gallops, no murmurs. No carotid bruits, no JVD Abdomen: soft, positive BS, non-tender, some ascites, unable to assess organomegaly, not an acute abdomen GU: not examined Neuro: CN II - XII grossly intact, sensation intact Musculoskeletal: strength 5/5 all extremities, no clubbing, cyanosis or 2+ edema left LE, small ulcer/openining on sole of left foot, plantar surface, lateral foot Skin: no rash, no subcutaneous crepitation, no decubitus, cellulitis and warmth left LE, peticheal rash below knee Psych: appropriate patient   Labs on Admission:   [facility] 12/19/12 0116  NA 139  K 3.3*  CL 103  CO2 27  GLUCOSE 217*  BUN 21  CREATININE 0.80  CALCIUM 8.3*  MG --  PHOS --     Basename 12/19/12 0116  WBC 4.0  NEUTROABS 2.8  HGB 10.2*  HCT 30.0*  MCV 87.0  PLT 76*    Micro Results: Results for JAIVIAN, BATTAGLINI (MRN [id number]) as of 12/19/2012 04:30  Ref. Range 12/18/2012 23:32  Color, Urine Latest Range: YELLOW  AMBER (A)  APPearance Latest Range: CLEAR  CLOUDY (A)  Specific Gravity, Urine Latest Range: 1.005-1.030  1.023  pH Latest Range: 5.0-8.0  5.0  Glucose Latest Range: NEGATIVE mg/dL 409 (A)  Bilirubin Urine Latest Range: NEGATIVE  SMALL (A)  Ketones, ur Latest Range: NEGATIVE mg/dL NEGATIVE  Protein Latest Range: NEGATIVE mg/dL >811 (A)  Urobilinogen, UA Latest Range: 0.0-1.0 mg/dL 1.0  Nitrite Latest Range: NEGATIVE  NEGATIVE  Leukocytes, UA Latest Range: NEGATIVE  NEGATIVE  Hgb urine dipstick Latest Range: NEGATIVE  LARGE (A)  WBC, UA Latest Range: <3 WBC/hpf 0-2  RBC / HPF Latest Range: <3 RBC/hpf 21-50  Squamous Epithelial / LPF Latest Range: RARE  RARE  Bacteria, UA Latest Range: RARE  RARE  Casts Latest Range: NEGATIVE  GRANULAR CAST (A)     Radiological Exams on Admission: Dg Tibia/fibula Left  12/19/2012  *RADIOLOGY REPORT*  Clinical Data: Knee pain  LEFT TIBIA AND FIBULA - 2 VIEW  Comparison: None.  Findings: No osseous abnormality of  the left tibia or fibula.  No soft tissue abnormality.  Knee joint and ankle joint demonstrate mild degenerative change.  IMPRESSION: No acute osseous abnormality.   Original Report Authenticated By: Genevive Bi, M.D.    US Venous Img Lower Unilateral Left  12/18/2012  *RADIOLOGY REPORT*  Clinical Data: Left knee pain and lower leg pain, swelling and redness  LEFT LOWER EXTREMITY VENOUS DUPLEX ULTRASOUND  Technique:  Gray-scale sonography with graded compression, as well as color Doppler and duplex ultrasound were performed to evaluate the deep venous system of the lower extremity from the level of the common femoral vein through the popliteal and proximal calf veins. Spectral Doppler was utilized to evaluate flow at rest and with distal augmentation maneuvers.  Comparison:  None.  Findings:  Normal compressibility of the common femoral, superficial femoral, and popliteal veins is demonstrated, as well as the visualized proximal calf veins.  No filling defects to suggest DVT on grayscale or color Doppler imaging.  Doppler waveforms show normal direction of venous flow, normal respiratory phasicity and response to augmentation.  IMPRESSION: No evidence of lower extremity deep vein thrombosis.   Original Report Authenticated By: Genevive Bi, M.D.    Dg Knee Complete 4 Views Left  12/19/2012  *RADIOLOGY REPORT*  Clinical Data: Sudden onset left knee pain  LEFT KNEE - COMPLETE 4+ VIEW  Comparison: None  Findings: No fracture or dislocation of the left knee.  No joint effusion.  There is mild spurring of the medial and lateral compartment as well as the patellofemoral compartment.  IMPRESSION:  1.  No acute findings left knee. 2.  Mild tricompartment osteoarthritis.   Original Report Authenticated By: Genevive Bi, M.D.     Assessment/Plan Present on Admission:  . Diabetic foot ulcer . Cellulitis left foot Admit to med surg IV antibiotics started vanco and zosyn MRI foot r/o  osteomyelitis Consult ortho Dr Lestine Box if needed . DM type 2, causing other manifestations, not at goal . Cirrhosis . Portal hypertension . HTN (hypertension), benign Stable, ADA diet and sliding scale insulin Thrombocytopenia   Full code SCD's for DVT  Prophylaxis  Sherly Brodbeck 12/19/2012, 5:00 AM

## 2012-12-19 NOTE — ED Notes (Signed)
Report given to [facility] 

## 2012-12-19 NOTE — Progress Notes (Signed)
Inpatient Diabetes Program Recommendations  AACE/ADA: New Consensus Statement on Inpatient Glycemic Control (2013)  Target Ranges:  Prepandial:   less than 140 mg/dL      Peak postprandial:   less than 180 mg/dL (1-2 hours)      Critically ill patients:  140 - 180 mg/dL   Reason for Visit: Hyperglycemia  Results for KRISTOFER, SCHAFFERT (MRN [id number]) as of 12/19/2012 15:38  Ref. Range 12/19/2012 07:44 12/19/2012 12:11  Glucose-Capillary Latest Range: 70-99 mg/dL 782 (H) 956 (H)  Results for GIROLAMO, LORTIE (MRN [id number]) as of 12/19/2012 15:38  Ref. Range 12/19/2012 08:03  Sodium Latest Range: 135-145 mEq/L 138  Potassium Latest Range: 3.5-5.1 mEq/L 3.6  Chloride Latest Range: 96-112 mEq/L 104  CO2 Latest Range: 19-32 mEq/L 26  BUN Latest Range: 6-23 mg/dL 19  Creatinine Latest Range: 0.50-1.35 mg/dL 4.69  Calcium Latest Range: 8.4-10.5 mg/dL 8.0 (L)  GFR calc non Af Amer Latest Range: >90 mL/min >90  GFR calc Af Amer Latest Range: >90 mL/min >90  Glucose Latest Range: 70-99 mg/dL 629 (H)  Results for THEOPHILUS, WALZ (MRN [id number]) as of 12/19/2012 15:38  Ref. Range 10/02/2012 11:16  Hemoglobin A1C Latest Range: 4.6-6.5 % 8.7 (H)    Inpatient Diabetes Program Recommendations Insulin - Basal: Uptitrate Lantus until FBS < 180 mg/dL Insulin - Meal Coverage: Add meal coverage insulin - Novolog 8 units tidwc if pt eats >50% meal  Note: Will follow with you.  Thank you. Ailene Ards, RD, LDN, CDE Inpatient Diabetes Coordinator [phone]

## 2012-12-19 NOTE — Progress Notes (Signed)
Patient seen. He was admitted Few hrs ago by my partner for left leg cellulitis along with chronic ulcer in the left foot for which he follows with Dr. Lestine Box and has undergone left fourth and fifth toe amputation 2 months ago. He has chronic left foot ulcer for the last 3 months.  He presents with one-day history of fever and chills along with Left foot and left leg redness and mild discomfort, condition exhaled left knee and left leg are stable, he was placed on empiric IV vancomycin and Zosyn. Currently he is afebrile,Cellulitis minute he has some chronic discoloration of his left foot. For now will continue empiric antibiotics,would repeat labs in the morning, have requested [facility] orthopedics physician on call to evaluate the patient from there standpoint as he has chronic left foot ulcer. MRI has been ordered upon admission which is pending.

## 2012-12-20 ENCOUNTER — Inpatient Hospital Stay (HOSPITAL_COMMUNITY): Payer: Managed Care, Other (non HMO)

## 2012-12-20 ENCOUNTER — Telehealth: Payer: Self-pay | Admitting: Family Medicine

## 2012-12-20 DIAGNOSIS — G609 Hereditary and idiopathic neuropathy, unspecified: Secondary | ICD-10-CM

## 2012-12-20 DIAGNOSIS — I10 Essential (primary) hypertension: Secondary | ICD-10-CM

## 2012-12-20 DIAGNOSIS — M869 Osteomyelitis, unspecified: Secondary | ICD-10-CM | POA: Diagnosis present

## 2012-12-20 LAB — BASIC METABOLIC PANEL
BUN: 14 mg/dL (ref 6–23)
CO2: 27 mEq/L (ref 19–32)
Calcium: 8.3 mg/dL — ABNORMAL LOW (ref 8.4–10.5)
Chloride: 105 mEq/L (ref 96–112)
Creatinine, Ser: 0.95 mg/dL (ref 0.50–1.35)
Glucose, Bld: 158 mg/dL — ABNORMAL HIGH (ref 70–99)

## 2012-12-20 LAB — GLUCOSE, CAPILLARY
Glucose-Capillary: 133 mg/dL — ABNORMAL HIGH (ref 70–99)
Glucose-Capillary: 174 mg/dL — ABNORMAL HIGH (ref 70–99)
Glucose-Capillary: 258 mg/dL — ABNORMAL HIGH (ref 70–99)

## 2012-12-20 LAB — CBC
HCT: 28.9 % — ABNORMAL LOW (ref 39.0–52.0)
MCH: 29.2 pg (ref 26.0–34.0)
MCHC: 34.3 g/dL (ref 30.0–36.0)
MCV: 85.3 fL (ref 78.0–100.0)
Platelets: 83 10*3/uL — ABNORMAL LOW (ref 150–400)
RDW: 13.9 % (ref 11.5–15.5)
WBC: 3.6 10*3/uL — ABNORMAL LOW (ref 4.0–10.5)

## 2012-12-20 MED ORDER — HYDRALAZINE HCL 25 MG PO TABS
25.0000 mg | ORAL_TABLET | Freq: Three times a day (TID) | ORAL | Status: DC
  Administered 2012-12-20 – 2012-12-21 (×2): 25 mg via ORAL
  Filled 2012-12-20 (×5): qty 1

## 2012-12-20 MED ORDER — GADOBENATE DIMEGLUMINE 529 MG/ML IV SOLN
20.0000 mL | Freq: Once | INTRAVENOUS | Status: AC
  Administered 2012-12-20: 20 mL via INTRAVENOUS

## 2012-12-20 MED ORDER — INSULIN ASPART 100 UNIT/ML ~~LOC~~ SOLN
3.0000 [IU] | Freq: Three times a day (TID) | SUBCUTANEOUS | Status: DC
  Administered 2012-12-20 – 2012-12-21 (×5): 3 [IU] via SUBCUTANEOUS

## 2012-12-20 NOTE — Progress Notes (Signed)
[facility] PROGRESS NOTE  [name] NFA:[id number] DOB: 1961/11/14 DOA: 12/18/2012 PCP: [name], MD  Brief narrative: 52 year old male with past medical history of diabetes, neuropathy and left 4th and 5th toe amputation who presented 12/18/2012 to [facility] with fevers and chills in addition to swollen left knee. MRI left foot shows findings consistent with osteomyelitis. Patient was started on zosyn and vanco but with these findings I think it is reasonable to narrow the treatment to vancomycin only.  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  No active bleed  Continue to monitor  Peripheral neuropathy  Continue gabapentin  Diabetes mellitus type 2, uncontrolled, with complications  Hemoglobin A1c 8.7  Continue insulin 3 units TID and 35 units Q HS    Code Status: full code Family Communication: family not at bedside Disposition Plan: home when stable  Manson Passey, MD  [facility] Pager [phone]  If 7PM-7AM, please contact night-coverage [email or web address] Password [email or web address] 12/20/2012, 4:57 PM   LOS: 2 days   Consultants:  Ortho  Procedures:  None   Antibiotics:  vanco 12/18/2012 -->  Zosyn 12/31-2013 -->  HPI/Subjective: No acute overnight events.  Objective: Filed Vitals:   12/19/12 1709 12/19/12 2149 12/20/12 0628 12/20/12 1558  BP: 160/75 164/72 136/70 160/81  Pulse: 70 75 67 71  Temp: 98.2 F (36.8 C) 97.9 F (36.6 C) 98 F (36.7 C) 98 F (36.7 C)  TempSrc:  Oral Oral   Resp: 18 18 20 20   Height:      Weight:      SpO2: 99% 98% 98% 100%    Intake/Output Summary (Last 24 hours) at  12/20/12 1657 Last data filed at 12/20/12 0500  Gross per 24 hour  Intake   1410 ml  Output      0 ml  Net   1410 ml    Exam:   General:  Pt is alert, follows commands appropriately, not in acute distress  Cardiovascular: Regular rate and rhythm, S1/S2, no murmurs, no rubs, no gallops  Respiratory: Clear to auscultation bilaterally, no wheezing, no crackles, no rhonchi  Abdomen: Soft, non tender, non distended, bowel sounds present, no guarding  Extremities: left foot 4th and 5th toe amputated and plantar ulcer, dry; left knee erythema and swelling improving  Neuro: Grossly nonfocal  Data Reviewed: Basic Metabolic Panel:  Lab 12/20/12 2841 12/19/12 0803 12/19/12 0116  NA 141 138 139  K 3.6 3.6 3.3*  CL 105 104 103  CO2 27 26 27   GLUCOSE 158* 217* 217*  BUN 14 19 21   CREATININE 0.95 0.76 0.80  CALCIUM 8.3* 8.0* 8.3*  MG -- -- --  PHOS -- -- --   CBC:  Lab 12/20/12 0540 12/19/12 0116  WBC 3.6* 4.0  NEUTROABS -- 2.8  HGB 9.9* 10.2*  HCT 28.9* 30.0*  MCV 85.3 87.0  PLT 83* 76*   CBG:  Lab 12/20/12 1230 12/20/12 0752 12/19/12 2146 12/19/12 1706 12/19/12 1211  GLUCAP 133* 119* 224* 197* 208*    Recent Results (from the past 240 hour(s))  CULTURE, BLOOD (ROUTINE X 2)     Status: Normal (Preliminary result)   Collection Time   12/19/12  1:15 AM      Component Value Range Status Comment   Specimen Description BLOOD RIGHT ANTECUBITAL 10cc   Final    Special Requests BOTTLES DRAWN AEROBIC AND ANAEROBIC   Final    Culture  Setup Time 12/19/2012 06:57   Final    Culture     Final    Value:        BLOOD CULTURE RECEIVED NO GROWTH TO DATE CULTURE WILL BE HELD FOR 5 DAYS BEFORE ISSUING A FINAL NEGATIVE REPORT   Report Status PENDING   Incomplete   CULTURE, BLOOD (ROUTINE X 2)     Status: Normal (Preliminary result)   Collection Time   12/19/12  2:05 AM      Component Value Range Status Comment   Specimen Description BLOOD LEFT HAND   Final    Special Requests BOTTLES  DRAWN AEROBIC AND ANAEROBIC 5cc   Final    Culture  Setup Time 12/19/2012 06:57   Final    Culture     Final    Value:        BLOOD CULTURE RECEIVED NO GROWTH TO DATE CULTURE WILL BE HELD FOR 5 DAYS BEFORE ISSUING A FINAL NEGATIVE REPORT   Report Status PENDING   Incomplete      Studies: Dg Tibia/fibula Left 12/19/2012  * IMPRESSION: No acute osseous abnormality.   Original Report Authenticated By: Genevive Bi, M.D.    Mr Foot Left W Wo Contrast 12/20/2012  *  IMPRESSION:  1.  MR findings consistent with osteomyelitis involving the fourth metatarsal head with marked surrounding soft tissue inflammation but no discrete drainable soft tissue abscess. 2.  Surgical change related to fourth and fifth toe amputations and partial fifth metatarsal amputation. 3.  Severe diffuse cellulitis and mild myositis.     US Venous Img Lower Unilateral Left 12/18/2012  *  IMPRESSION: No evidence of lower extremity deep vein thrombosis.   Original Report Authenticated By: Genevive Bi, M.D.    Dg Knee Complete 4 Views Left 12/19/2012   IMPRESSION:  1.  No acute findings left knee. 2.  Mild tricompartment osteoarthritis.   Original Report Authenticated By: Genevive Bi, M.D.     Scheduled Meds:   . furosemide  40 mg Oral Daily  . gabapentin  300 mg Oral TID  . insulin aspart  0-15 Units Subcutaneous TID WC  . insulin aspart  0-5 Units Subcutaneous QHS  . insulin aspart  3 Units Subcutaneous TID WC  . insulin glargine  35 Units Subcutaneous QHS  . piperacillin-tazobactam (ZOSYN)  IV  3.375 g Intravenous Q8H  . potassium chloride  40 mEq Oral Once  . vancomycin  1,000 mg Intravenous Q8H

## 2012-12-20 NOTE — Consult Note (Signed)
Reason for Consult: left foot ulcer Referring Physician: Gery Pray MD  [name] is an 52 y.o. male.  HPI: 51y/o caucasian male currently admitted to medical service. He is an insulin dependent diabetic. He is well known to Dr. Lestine Box who performed 4th and 5th toe amputation to the left foot approximately 2 months ago due to osteomyelitis. He has been following up with Dr. Lestine Box (last appt 12/16 and next scheduled 1/19) and reports some debridement of his ulcer at the last visit. He has continued to clean the ulcer on a regular basis and reports no pain or changes. He reports an episode of fever/chills 4 days ago. He noted L lower leg swelling and pain with weightbearing 2 days ago and presented to Urgent Care, then was transferred to [facility]. Pt reports he did not notice any erythema over his L leg until it was pointed out after his admission, and states it is much improved. MRI of the L foot has been ordered to r/o osteomyelitis.  Past Medical History  Diagnosis Date  . Heart murmur     ECHO 07/2011 showed mild AS and LVH.  TEE 09/2011 showed small PFO, bicuspid aortic valve but no stenosis or regurg.  . 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  F/u u/s for ongoing/worsening thrombocytopenia showed no more splenomegaly.  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  Gen surg and GI recommended flagyl and cipro x 2 wks..  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  . Cholelithiasis 09/2011  . Alcoholism   . Peripheral neuropathy 2012    Diabetic:  Autonomic (pelvic) and LE polyneuropathy--[facility] testing showed that it is likely from DM  . 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 Arthritis     back L3 - L5  .  Hyperactive gag reflex   . History of pyelonephritis 09/2011  . Diabetes mellitus     IDDM  . Wears dentures     upper  . Osteomyelitis of toe of left foot 10/22/12    left 4th and 5th toes; states left 5th toe is open wound    Past Surgical History  Procedure Date  . Knee arthroscopy 2001    Bilateral  . Amputation 10/24/2012    Procedure: AMPUTATION RAY;  Surgeon: Sherri Rad, MD;  Location: [facility];  Service: Orthopedics;  Laterality: Left;  left 4th toe amputation through MTP joint, 5th Ray amputation   . Knee surgery 2001    Family History  Problem Relation Age of Onset  . Heart disease Father     Social History:  reports that he has never smoked. His smokeless tobacco use includes Chew. He reports that he does not drink alcohol or use illicit drugs.  Allergies:  Allergies  Allergen Reactions  . Naproxen Nausea And Vomiting  . Other Itching and Other (See Comments)    PERFUMES - SNEEZING    Medications: I have reviewed the patient's current medications.  Results for orders placed during the hospital encounter of 12/18/12 (from the past 48 hour(s))  URINALYSIS, ROUTINE W REFLEX MICROSCOPIC     Status: Abnormal   Collection Time   12/18/12 11:32 PM      Component Value  Range Comment   Color, Urine AMBER (*) YELLOW BIOCHEMICALS MAY BE AFFECTED BY COLOR   APPearance CLOUDY (*) CLEAR    Specific Gravity, Urine 1.023  1.005 - 1.030    pH 5.0  5.0 - 8.0    Glucose, UA 100 (*) NEGATIVE mg/dL    Hgb urine dipstick LARGE (*) NEGATIVE    Bilirubin Urine SMALL (*) NEGATIVE    Ketones, ur NEGATIVE  NEGATIVE mg/dL    Protein, ur >161 (*) NEGATIVE mg/dL    Urobilinogen, UA 1.0  0.0 - 1.0 mg/dL    Nitrite NEGATIVE  NEGATIVE    Leukocytes, UA NEGATIVE  NEGATIVE   URINE MICROSCOPIC-ADD ON     Status: Abnormal   Collection Time   12/18/12 11:32 PM      Component Value Range Comment   Squamous Epithelial / LPF RARE  RARE    WBC, UA 0-2  <3 WBC/hpf    RBC  / HPF 21-50  <3 RBC/hpf    Bacteria, UA RARE  RARE    Casts GRANULAR CAST (*) NEGATIVE HYALINE CASTS  CULTURE, BLOOD (ROUTINE X 2)     Status: Normal (Preliminary result)   Collection Time   12/19/12  1:15 AM      Component Value Range Comment   Specimen Description BLOOD RIGHT ANTECUBITAL 10cc      Special Requests BOTTLES DRAWN AEROBIC AND ANAEROBIC      Culture  Setup Time 12/19/2012 06:57      Culture        Value:        BLOOD CULTURE RECEIVED NO GROWTH TO DATE CULTURE WILL BE HELD FOR 5 DAYS BEFORE ISSUING A FINAL NEGATIVE REPORT   Report Status PENDING     CBC WITH DIFFERENTIAL     Status: Abnormal   Collection Time   12/19/12  1:16 AM      Component Value Range Comment   WBC 4.0  4.0 - 10.5 K/uL    RBC 3.45 (*) 4.22 - 5.81 MIL/uL    Hemoglobin 10.2 (*) 13.0 - 17.0 g/dL    HCT 09.6 (*) 04.5 - 52.0 %    MCV 87.0  78.0 - 100.0 fL    MCH 29.6  26.0 - 34.0 pg    MCHC 34.0  30.0 - 36.0 g/dL    RDW 40.9  81.1 - 91.4 %    Platelets 76 (*) 150 - 400 K/uL    Neutrophils Relative 68  43 - 77 %    Lymphocytes Relative 12  12 - 46 %    Monocytes Relative 16 (*) 3 - 12 %    Eosinophils Relative 3  0 - 5 %    Basophils Relative 1  0 - 1 %    Neutro Abs 2.8  1.7 - 7.7 K/uL    Lymphs Abs 0.5 (*) 0.7 - 4.0 K/uL    Monocytes Absolute 0.6  0.1 - 1.0 K/uL    Eosinophils Absolute 0.1  0.0 - 0.7 K/uL    Basophils Absolute 0.0  0.0 - 0.1 K/uL    WBC Morphology ATYPICAL LYMPHOCYTES      Smear Review LARGE PLATELETS PRESENT     BASIC METABOLIC PANEL     Status: Abnormal   Collection Time   12/19/12  1:16 AM      Component Value Range Comment   Sodium 139  135 - 145 mEq/L    Potassium 3.3 (*) 3.5 - 5.1 mEq/L    Chloride  103  96 - 112 mEq/L    CO2 27  19 - 32 mEq/L    Glucose, Bld 217 (*) 70 - 99 mg/dL    BUN 21  6 - 23 mg/dL    Creatinine, Ser 3.08  0.50 - 1.35 mg/dL    Calcium 8.3 (*) 8.4 - 10.5 mg/dL    GFR calc non Af Amer >90  >90 mL/min    GFR calc Af Amer >90  >90 mL/min   CULTURE,  BLOOD (ROUTINE X 2)     Status: Normal (Preliminary result)   Collection Time   12/19/12  2:05 AM      Component Value Range Comment   Specimen Description BLOOD LEFT HAND      Special Requests BOTTLES DRAWN AEROBIC AND ANAEROBIC 5cc      Culture  Setup Time 12/19/2012 06:57      Culture        Value:        BLOOD CULTURE RECEIVED NO GROWTH TO DATE CULTURE WILL BE HELD FOR 5 DAYS BEFORE ISSUING A FINAL NEGATIVE REPORT   Report Status PENDING     GLUCOSE, CAPILLARY     Status: Abnormal   Collection Time   12/19/12  7:44 AM      Component Value Range Comment   Glucose-Capillary 213 (*) 70 - 99 mg/dL    Comment 1 Notify RN     BASIC METABOLIC PANEL     Status: Abnormal   Collection Time   12/19/12  8:03 AM      Component Value Range Comment   Sodium 138  135 - 145 mEq/L    Potassium 3.6  3.5 - 5.1 mEq/L    Chloride 104  96 - 112 mEq/L    CO2 26  19 - 32 mEq/L    Glucose, Bld 217 (*) 70 - 99 mg/dL    BUN 19  6 - 23 mg/dL    Creatinine, Ser 6.57  0.50 - 1.35 mg/dL    Calcium 8.0 (*) 8.4 - 10.5 mg/dL    GFR calc non Af Amer >90  >90 mL/min    GFR calc Af Amer >90  >90 mL/min   GLUCOSE, CAPILLARY     Status: Abnormal   Collection Time   12/19/12 12:11 PM      Component Value Range Comment   Glucose-Capillary 208 (*) 70 - 99 mg/dL    Comment 1 Notify RN     GLUCOSE, CAPILLARY     Status: Abnormal   Collection Time   12/19/12  5:06 PM      Component Value Range Comment   Glucose-Capillary 197 (*) 70 - 99 mg/dL    Comment 1 Notify RN     GLUCOSE, CAPILLARY     Status: Abnormal   Collection Time   12/19/12  9:46 PM      Component Value Range Comment   Glucose-Capillary 224 (*) 70 - 99 mg/dL   BASIC METABOLIC PANEL     Status: Abnormal   Collection Time   12/20/12  5:40 AM      Component Value Range Comment   Sodium 141  135 - 145 mEq/L    Potassium 3.6  3.5 - 5.1 mEq/L    Chloride 105  96 - 112 mEq/L    CO2 27  19 - 32 mEq/L    Glucose, Bld 158 (*) 70 - 99 mg/dL    BUN 14  6 - 23 mg/dL  Creatinine, Ser 0.95  0.50 - 1.35 mg/dL    Calcium 8.3 (*) 8.4 - 10.5 mg/dL    GFR calc non Af Amer >90  >90 mL/min    GFR calc Af Amer >90  >90 mL/min   CBC     Status: Abnormal   Collection Time   12/20/12  5:40 AM      Component Value Range Comment   WBC 3.6 (*) 4.0 - 10.5 K/uL    RBC 3.39 (*) 4.22 - 5.81 MIL/uL    Hemoglobin 9.9 (*) 13.0 - 17.0 g/dL    HCT 16.1 (*) 09.6 - 52.0 %    MCV 85.3  78.0 - 100.0 fL    MCH 29.2  26.0 - 34.0 pg    MCHC 34.3  30.0 - 36.0 g/dL    RDW 04.5  40.9 - 81.1 %    Platelets 83 (*) 150 - 400 K/uL PLATELET COUNT CONFIRMED BY SMEAR  GLUCOSE, CAPILLARY     Status: Abnormal   Collection Time   12/20/12  7:52 AM      Component Value Range Comment   Glucose-Capillary 119 (*) 70 - 99 mg/dL    Comment 1 Notify RN       Dg Tibia/fibula Left  12/19/2012  *RADIOLOGY REPORT*  Clinical Data: Knee pain  LEFT TIBIA AND FIBULA - 2 VIEW  Comparison: None.  Findings: No osseous abnormality of the left tibia or fibula.  No soft tissue abnormality.  Knee joint and ankle joint demonstrate mild degenerative change.  IMPRESSION: No acute osseous abnormality.   Original Report Authenticated By: Genevive Bi, M.D.    US Venous Img Lower Unilateral Left  12/18/2012  *RADIOLOGY REPORT*  Clinical Data: Left knee pain and lower leg pain, swelling and redness  LEFT LOWER EXTREMITY VENOUS DUPLEX ULTRASOUND  Technique:  Gray-scale sonography with graded compression, as well as color Doppler and duplex ultrasound were performed to evaluate the deep venous system of the lower extremity from the level of the common femoral vein through the popliteal and proximal calf veins. Spectral Doppler was utilized to evaluate flow at rest and with distal augmentation maneuvers.  Comparison:  None.  Findings:  Normal compressibility of the common femoral, superficial femoral, and popliteal veins is demonstrated, as well as the visualized proximal calf veins.  No filling defects to suggest DVT on  grayscale or color Doppler imaging.  Doppler waveforms show normal direction of venous flow, normal respiratory phasicity and response to augmentation.  IMPRESSION: No evidence of lower extremity deep vein thrombosis.   Original Report Authenticated By: Genevive Bi, M.D.    Dg Knee Complete 4 Views Left  12/19/2012  *RADIOLOGY REPORT*  Clinical Data: Sudden onset left knee pain  LEFT KNEE - COMPLETE 4+ VIEW  Comparison: None  Findings: No fracture or dislocation of the left knee.  No joint effusion.  There is mild spurring of the medial and lateral compartment as well as the patellofemoral compartment.  IMPRESSION:  1.  No acute findings left knee. 2.  Mild tricompartment osteoarthritis.   Original Report Authenticated By: Genevive Bi, M.D.     Review of Systems  Constitutional: Negative.        Upon presentation to ER pt was experiencing fever, chills but have now resolved.  HENT: Negative.   Eyes: Negative.   Respiratory: Negative.   Cardiovascular: Negative.   Gastrointestinal: Negative.   Genitourinary: Negative.   Musculoskeletal:       Left knee/lower leg swelling Denies  foot pain  Skin: Positive for rash.       Redness left knee, rash left knee and lower leg  Neurological: Negative.   Endo/Heme/Allergies: Negative.   Psychiatric/Behavioral: Negative.    Blood pressure 136/70, pulse 67, temperature 98 F (36.7 C), temperature source Oral, resp. rate 20, height 5\' 10"  (1.778 m), weight 95.255 kg (210 lb), SpO2 98.00%. Physical Exam  Constitutional: He is oriented to person, place, and time. He appears well-developed and well-nourished.  HENT:  Head: Normocephalic and atraumatic.  Eyes: Pupils are equal, round, and reactive to light.  Neck: Neck supple.  Cardiovascular: Normal rate and regular rhythm.   Respiratory: Effort normal and breath sounds normal.  GI: Soft.  Musculoskeletal:       Mild swelling L lower leg. No knee or ankle effusion. Tender to palpation  anterior L knee at site of erythema. Ankle and foot nontender. Dry ulcer plantar aspect of 4th MT head, nontender, nonerythematous.  Neurological: He is alert and oriented to person, place, and time. He has normal reflexes.  Skin: Skin is warm and dry. Rash noted. There is erythema.       Small area of erythema anterior left knee, slightly tender, approx 2cm diameter.  Petechial type rash proximal aspect anterior left lower leg.    Assessment/Plan: Cellulitis left knee, improving Left foot diabetic ulcer  Discussed with Dr. Shelle Iron, who will discuss with Dr. Lestine Box and Rexene Edison, PA-C, who will evaluate the patient Continue with abx per admitting service for cellulitis Diabetic control  BISSELL, JACLYN M. 12/20/2012, 10:28 AM

## 2012-12-20 NOTE — Consult Note (Signed)
Reason for Consult:Left foot pain and swelling Referring Physician: [name] is an 52 y.o. male.  HPI: 52 year old male well known to [name] with fever and chills 5 days ago. Followed by left foot pain and swelling which started 3 days ago. Patient with history of 4th toe amputation and 5th ray amputation performed by [name] 2 months ago. Patient seen last in office by [name] 12/03/12 at that time full thickness debridement of callus under the 4th metatarsal head was done.No signs of infection or purulence where encountered at that time.  Past Medical History  Diagnosis Date  . Heart murmur     ECHO 07/2011 showed mild AS and LVH.  TEE 09/2011 showed small PFO, bicuspid aortic valve but no stenosis or regurg.  . 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  F/u u/s for ongoing/worsening thrombocytopenia showed no more splenomegaly.  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  Gen surg and GI recommended flagyl and cipro x 2 wks..  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  . Cholelithiasis 09/2011  . Alcoholism   . Peripheral neuropathy 2012    Diabetic:  Autonomic (pelvic) and LE polyneuropathy--[facility] testing showed that it is likely from DM  . 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 Arthritis     back L3 - L5  . Hyperactive gag reflex   . History of pyelonephritis 09/2011  . Diabetes mellitus     IDDM  . Wears dentures     upper  . Osteomyelitis of toe of left foot 10/22/12    left 4th and 5th toes; states left 5th toe is open wound    Past Surgical History  Procedure Date  . Knee arthroscopy 2001    Bilateral  . Amputation 10/24/2012    Procedure: AMPUTATION RAY;  Surgeon: Ralph Rad,  MD;  Location: [facility];  Service: Orthopedics;  Laterality: Left;  left 4th toe amputation through MTP joint, 5th Ray amputation   . Knee surgery 2001    Family History  Problem Relation Age of Onset  . Heart disease Father     Social History:  reports that he has never smoked. His smokeless tobacco use includes Chew. He reports that he does not drink alcohol or use illicit drugs.  Allergies:  Allergies  Allergen Reactions  . Naproxen Nausea And Vomiting  . Other Itching and Other (See Comments)    PERFUMES - SNEEZING    Medications: I have reviewed the patient's current medications.  Results for orders placed during the hospital encounter of 12/18/12 (from the past 48 hour(s))  URINALYSIS, ROUTINE W REFLEX MICROSCOPIC     Status: Abnormal   Collection Time   12/18/12 11:32 PM      Component Value Range Comment   Color, Urine AMBER (*) YELLOW BIOCHEMICALS MAY BE AFFECTED BY COLOR   APPearance CLOUDY (*) CLEAR    Specific Gravity, Urine 1.023  1.005 - 1.030    pH 5.0  5.0 - 8.0    Glucose, UA 100 (*) NEGATIVE mg/dL    Hgb urine dipstick LARGE (*) NEGATIVE    Bilirubin Urine SMALL (*) NEGATIVE    Ketones, ur NEGATIVE  NEGATIVE  mg/dL    Protein, ur >086 (*) NEGATIVE mg/dL    Urobilinogen, UA 1.0  0.0 - 1.0 mg/dL    Nitrite NEGATIVE  NEGATIVE    Leukocytes, UA NEGATIVE  NEGATIVE   URINE MICROSCOPIC-ADD ON     Status: Abnormal   Collection Time   12/18/12 11:32 PM      Component Value Range Comment   Squamous Epithelial / LPF RARE  RARE    WBC, UA 0-2  <3 WBC/hpf    RBC / HPF 21-50  <3 RBC/hpf    Bacteria, UA RARE  RARE    Casts GRANULAR CAST (*) NEGATIVE HYALINE CASTS  CULTURE, BLOOD (ROUTINE X 2)     Status: Normal (Preliminary result)   Collection Time   12/19/12  1:15 AM      Component Value Range Comment   Specimen Description BLOOD RIGHT ANTECUBITAL 10cc      Special Requests BOTTLES DRAWN AEROBIC AND ANAEROBIC      Culture  Setup Time 12/19/2012  06:57      Culture        Value:        BLOOD CULTURE RECEIVED NO GROWTH TO DATE CULTURE WILL BE HELD FOR 5 DAYS BEFORE ISSUING A FINAL NEGATIVE REPORT   Report Status PENDING     CBC WITH DIFFERENTIAL     Status: Abnormal   Collection Time   12/19/12  1:16 AM      Component Value Range Comment   WBC 4.0  4.0 - 10.5 K/uL    RBC 3.45 (*) 4.22 - 5.81 MIL/uL    Hemoglobin 10.2 (*) 13.0 - 17.0 g/dL    HCT 57.8 (*) 46.9 - 52.0 %    MCV 87.0  78.0 - 100.0 fL    MCH 29.6  26.0 - 34.0 pg    MCHC 34.0  30.0 - 36.0 g/dL    RDW 62.9  52.8 - 41.3 %    Platelets 76 (*) 150 - 400 K/uL    Neutrophils Relative 68  43 - 77 %    Lymphocytes Relative 12  12 - 46 %    Monocytes Relative 16 (*) 3 - 12 %    Eosinophils Relative 3  0 - 5 %    Basophils Relative 1  0 - 1 %    Neutro Abs 2.8  1.7 - 7.7 K/uL    Lymphs Abs 0.5 (*) 0.7 - 4.0 K/uL    Monocytes Absolute 0.6  0.1 - 1.0 K/uL    Eosinophils Absolute 0.1  0.0 - 0.7 K/uL    Basophils Absolute 0.0  0.0 - 0.1 K/uL    WBC Morphology ATYPICAL LYMPHOCYTES      Smear Review LARGE PLATELETS PRESENT     BASIC METABOLIC PANEL     Status: Abnormal   Collection Time   12/19/12  1:16 AM      Component Value Range Comment   Sodium 139  135 - 145 mEq/L    Potassium 3.3 (*) 3.5 - 5.1 mEq/L    Chloride 103  96 - 112 mEq/L    CO2 27  19 - 32 mEq/L    Glucose, Bld 217 (*) 70 - 99 mg/dL    BUN 21  6 - 23 mg/dL    Creatinine, Ser 2.44  0.50 - 1.35 mg/dL    Calcium 8.3 (*) 8.4 - 10.5 mg/dL    GFR calc non Af Amer >90  >90 mL/min    GFR calc  Af Amer >90  >90 mL/min   CULTURE, BLOOD (ROUTINE X 2)     Status: Normal (Preliminary result)   Collection Time   12/19/12  2:05 AM      Component Value Range Comment   Specimen Description BLOOD LEFT HAND      Special Requests BOTTLES DRAWN AEROBIC AND ANAEROBIC 5cc      Culture  Setup Time 12/19/2012 06:57      Culture        Value:        BLOOD CULTURE RECEIVED NO GROWTH TO DATE CULTURE WILL BE HELD FOR 5 DAYS BEFORE  ISSUING A FINAL NEGATIVE REPORT   Report Status PENDING     GLUCOSE, CAPILLARY     Status: Abnormal   Collection Time   12/19/12  7:44 AM      Component Value Range Comment   Glucose-Capillary 213 (*) 70 - 99 mg/dL    Comment 1 Notify RN     BASIC METABOLIC PANEL     Status: Abnormal   Collection Time   12/19/12  8:03 AM      Component Value Range Comment   Sodium 138  135 - 145 mEq/L    Potassium 3.6  3.5 - 5.1 mEq/L    Chloride 104  96 - 112 mEq/L    CO2 26  19 - 32 mEq/L    Glucose, Bld 217 (*) 70 - 99 mg/dL    BUN 19  6 - 23 mg/dL    Creatinine, Ser 1.61  0.50 - 1.35 mg/dL    Calcium 8.0 (*) 8.4 - 10.5 mg/dL    GFR calc non Af Amer >90  >90 mL/min    GFR calc Af Amer >90  >90 mL/min   GLUCOSE, CAPILLARY     Status: Abnormal   Collection Time   12/19/12 12:11 PM      Component Value Range Comment   Glucose-Capillary 208 (*) 70 - 99 mg/dL    Comment 1 Notify RN     GLUCOSE, CAPILLARY     Status: Abnormal   Collection Time   12/19/12  5:06 PM      Component Value Range Comment   Glucose-Capillary 197 (*) 70 - 99 mg/dL    Comment 1 Notify RN     GLUCOSE, CAPILLARY     Status: Abnormal   Collection Time   12/19/12  9:46 PM      Component Value Range Comment   Glucose-Capillary 224 (*) 70 - 99 mg/dL   BASIC METABOLIC PANEL     Status: Abnormal   Collection Time   12/20/12  5:40 AM      Component Value Range Comment   Sodium 141  135 - 145 mEq/L    Potassium 3.6  3.5 - 5.1 mEq/L    Chloride 105  96 - 112 mEq/L    CO2 27  19 - 32 mEq/L    Glucose, Bld 158 (*) 70 - 99 mg/dL    BUN 14  6 - 23 mg/dL    Creatinine, Ser 0.96  0.50 - 1.35 mg/dL    Calcium 8.3 (*) 8.4 - 10.5 mg/dL    GFR calc non Af Amer >90  >90 mL/min    GFR calc Af Amer >90  >90 mL/min   CBC     Status: Abnormal   Collection Time   12/20/12  5:40 AM      Component Value Range Comment   WBC 3.6 (*) 4.0 -  10.5 K/uL    RBC 3.39 (*) 4.22 - 5.81 MIL/uL    Hemoglobin 9.9 (*) 13.0 - 17.0 g/dL    HCT 52.8 (*) 41.3 -  52.0 %    MCV 85.3  78.0 - 100.0 fL    MCH 29.2  26.0 - 34.0 pg    MCHC 34.3  30.0 - 36.0 g/dL    RDW 24.4  01.0 - 27.2 %    Platelets 83 (*) 150 - 400 K/uL PLATELET COUNT CONFIRMED BY SMEAR  GLUCOSE, CAPILLARY     Status: Abnormal   Collection Time   12/20/12  7:52 AM      Component Value Range Comment   Glucose-Capillary 119 (*) 70 - 99 mg/dL    Comment 1 Notify RN     GLUCOSE, CAPILLARY     Status: Abnormal   Collection Time   12/20/12 12:30 PM      Component Value Range Comment   Glucose-Capillary 133 (*) 70 - 99 mg/dL    Comment 1 Notify RN       Dg Tibia/fibula Left  12/19/2012  *RADIOLOGY REPORT*  Clinical Data: Knee pain  LEFT TIBIA AND FIBULA - 2 VIEW  Comparison: None.  Findings: No osseous abnormality of the left tibia or fibula.  No soft tissue abnormality.  Knee joint and ankle joint demonstrate mild degenerative change.  IMPRESSION: No acute osseous abnormality.   Original Report Authenticated By: Genevive Bi, M.D.    Mr Foot Left W Wo Contrast  12/20/2012  *RADIOLOGY REPORT*  Clinical Data: Foot pain, swelling and open wound.  History of prior fifth toe amputation  MRI OF THE LEFT FOREFOOT WITHOUT AND WITH CONTRAST  Technique:  Multiplanar, multisequence MR imaging was performed both before and after administration of intravenous contrast.  Contrast: 20mL MULTIHANCE GADOBENATE DIMEGLUMINE 529 MG/ML IV SOLN  Comparison: 10/22/2012.  Findings: There are surgical changes related to recent fifth toe amputation through the level of the mid shaft of the fifth metatarsal. The fourth toe has also been amputated but the metatarsal is intact.  The fourth metatarsal head demonstrates increased T2 signal intensity and enhancement which is worrisome for osteomyelitis.  There is an open wound on the plantar aspect of the foot at the level of the fourth metatarsal head. There is also surrounding soft tissue inflammation and enhancement.  There is signal abnormality and some enhancement in the  area of the amputated fifth metatarsal but this is likely postoperative change. And also any obvious findings for residual osteomyelitis.  There is severe diffuse cellulitis.  Mild myositis.  No findings for septic arthritis.  IMPRESSION:  1.  MR findings consistent with osteomyelitis involving the fourth metatarsal head with marked surrounding soft tissue inflammation but no discrete drainable soft tissue abscess. 2.  Surgical change related to fourth and fifth toe amputations and partial fifth metatarsal amputation. 3.  Severe diffuse cellulitis and mild myositis.   Original Report Authenticated By: Rudie Meyer, M.D.    US Venous Img Lower Unilateral Left  12/18/2012  *RADIOLOGY REPORT*  Clinical Data: Left knee pain and lower leg pain, swelling and redness  LEFT LOWER EXTREMITY VENOUS DUPLEX ULTRASOUND  Technique:  Gray-scale sonography with graded compression, as well as color Doppler and duplex ultrasound were performed to evaluate the deep venous system of the lower extremity from the level of the common femoral vein through the popliteal and proximal calf veins. Spectral Doppler was utilized to evaluate flow at rest and with distal augmentation maneuvers.  Comparison:  None.  Findings:  Normal compressibility of the common femoral, superficial femoral, and popliteal veins is demonstrated, as well as the visualized proximal calf veins.  No filling defects to suggest DVT on grayscale or color Doppler imaging.  Doppler waveforms show normal direction of venous flow, normal respiratory phasicity and response to augmentation.  IMPRESSION: No evidence of lower extremity deep vein thrombosis.   Original Report Authenticated By: Genevive Bi, M.D.    Dg Knee Complete 4 Views Left  12/19/2012  *RADIOLOGY REPORT*  Clinical Data: Sudden onset left knee pain  LEFT KNEE - COMPLETE 4+ VIEW  Comparison: None  Findings: No fracture or dislocation of the left knee.  No joint effusion.  There is mild spurring of the  medial and lateral compartment as well as the patellofemoral compartment.  IMPRESSION:  1.  No acute findings left knee. 2.  Mild tricompartment osteoarthritis.   Original Report Authenticated By: Genevive Bi, M.D.     Review of Systems  Constitutional: Positive for fever and chills.  Musculoskeletal:       Reports swelling of left lower leg and knee  Skin:       Dry skin of lower legs    Blood pressure 160/81, pulse 71, temperature 98 F (36.7 C), temperature source Oral, resp. rate 20, height 5\' 10"  (1.778 m), weight 95.255 kg (210 lb), SpO2 100.00%. Physical Exam  Constitutional: He is oriented to person, place, and time. He appears well-developed and well-nourished.  Eyes: EOM are normal.  Cardiovascular: Normal rate and intact distal pulses.   Respiratory: Effort normal.  Musculoskeletal:       Left foot dry ulceration without erythema or warmth over plantar 4th metatarsal head . No purulence . Erythema left knee and cellulitic like changes over the lower leg. Calf supple non tender  Neurological: He is alert and oriented to person, place, and time.  Skin: Skin is warm and dry.  Psychiatric: He has a normal mood and affect.    Assessment/Plan: Patient responding well to IV antibiotics, No elevation of WBC and afebrile MRI of the foot with periosteal changes around 4th metatarsal head less likely osteomyelitis. No abscess.  Cellulitis left lower leg most likely secondary to excuration and dry skin. Recommend darco shoe left foot ( heel weight bearing darco shoe), IV antibiotics for 2 weeks followed followed by Doxy for 4-6 weeks. Dial soap soaks left foot daily with xeroform dressing change Follow-up with [name] in one week patient to call [phone] for appointment.   Richardean Canal 12/20/2012, 4:08 PM

## 2012-12-20 NOTE — Progress Notes (Signed)
Orthopedic Tech Progress Note Patient Details:  [name] [birth year] [id number]  Ortho Devices Type of Ortho Device: Darco shoe Ortho Device/Splint Location: (L) LE Ortho Device/Splint Interventions: Application   Jennye Moccasin 12/20/2012, 6:31 PM

## 2012-12-20 NOTE — Telephone Encounter (Signed)
Pls request office notes from Dr. Lestine Box at [facility] orthopedics.-thx

## 2012-12-21 DIAGNOSIS — E118 Type 2 diabetes mellitus with unspecified complications: Secondary | ICD-10-CM

## 2012-12-21 LAB — GLUCOSE, CAPILLARY
Glucose-Capillary: 152 mg/dL — ABNORMAL HIGH (ref 70–99)
Glucose-Capillary: 119 mg/dL — ABNORMAL HIGH (ref 70–99)

## 2012-12-21 LAB — VANCOMYCIN, TROUGH: Vancomycin Tr: 21.8 ug/mL — ABNORMAL HIGH (ref 10.0–20.0)

## 2012-12-21 MED ORDER — VANCOMYCIN HCL IN DEXTROSE 1-5 GM/200ML-% IV SOLN: 1000.0000 mg | Freq: Two times a day (BID) | INTRAVENOUS | Status: DC

## 2012-12-21 MED ORDER — SODIUM CHLORIDE 0.9 % IJ SOLN
10.0000 mL | INTRAMUSCULAR | Status: DC | PRN
  Administered 2012-12-21: 10 mL

## 2012-12-21 MED ORDER — HEPARIN SOD (PORK) LOCK FLUSH 100 UNIT/ML IV SOLN
250.0000 [IU] | Freq: Every day | INTRAVENOUS | Status: DC
  Filled 2012-12-21: qty 3

## 2012-12-21 MED ORDER — LISINOPRIL 5 MG PO TABS
5.0000 mg | ORAL_TABLET | Freq: Every day | ORAL | Status: DC
  Filled 2012-12-21: qty 1

## 2012-12-21 MED ORDER — POTASSIUM CHLORIDE CRYS ER 20 MEQ PO TBCR
40.0000 meq | EXTENDED_RELEASE_TABLET | Freq: Two times a day (BID) | ORAL | Status: DC
  Administered 2012-12-21: 40 meq via ORAL
  Filled 2012-12-21 (×2): qty 2

## 2012-12-21 MED ORDER — POTASSIUM CHLORIDE CRYS ER 20 MEQ PO TBCR: 40.0000 meq | EXTENDED_RELEASE_TABLET | Freq: Once | ORAL | Status: DC

## 2012-12-21 MED ORDER — VANCOMYCIN HCL IN DEXTROSE 1-5 GM/200ML-% IV SOLN
1000.0000 mg | Freq: Two times a day (BID) | INTRAVENOUS | Status: DC
  Administered 2012-12-21: 1000 mg via INTRAVENOUS
  Filled 2012-12-21 (×2): qty 200

## 2012-12-21 MED ORDER — INSULIN GLARGINE 100 UNIT/ML ~~LOC~~ SOLN: 40.0000 [IU] | Freq: Every day | SUBCUTANEOUS | Status: DC

## 2012-12-21 MED ORDER — FUROSEMIDE 10 MG/ML IJ SOLN
40.0000 mg | Freq: Once | INTRAMUSCULAR | Status: AC
  Administered 2012-12-21: 40 mg via INTRAVENOUS
  Filled 2012-12-21: qty 4

## 2012-12-21 MED ORDER — HYDROCODONE-ACETAMINOPHEN 5-325 MG PO TABS: 1.0000 | ORAL_TABLET | Freq: Four times a day (QID) | ORAL | Status: DC | PRN

## 2012-12-21 MED ORDER — LISINOPRIL 20 MG PO TABS
20.0000 mg | ORAL_TABLET | Freq: Every day | ORAL | Status: DC
  Administered 2012-12-21: 20 mg via ORAL
  Filled 2012-12-21: qty 1

## 2012-12-21 MED ORDER — HEPARIN SOD (PORK) LOCK FLUSH 100 UNIT/ML IV SOLN
250.0000 [IU] | INTRAVENOUS | Status: DC | PRN
  Administered 2012-12-21: 500 [IU]
  Filled 2012-12-21: qty 3

## 2012-12-21 MED ORDER — LISINOPRIL 20 MG PO TABS: 20.0000 mg | ORAL_TABLET | Freq: Every day | ORAL | Status: DC

## 2012-12-21 NOTE — Discharge Summary (Signed)
Physician Discharge Summary  [name] WUJ:[id number] DOB: 16-Sep-1961 DOA: 12/18/2012  PCP: [name], MD  Admit date: 12/18/2012 Discharge date: 12/21/2012  Time spent: 60  minutes  Recommendations for Outpatient Follow-up:  Follow up with ID. Discharge Diagnoses:  Principal Problem:  *Foot osteomyelitis Active Problems:  HTN (hypertension), benign  Thrombocytopenia  Peripheral neuropathy  Cirrhosis  Diabetes mellitus type 2, uncontrolled, with complications  Cellulitis  Diabetic foot ulcer   Discharge Condition: stable  Diet recommendation: low carb.  Filed Weights   12/18/12 2008  Weight: 95.255 kg (210 lb)    History of present illness:  52 y/o male who underwent a left 4rth and 5th metatarsal amputation on 11/06 d/t diabetic foot ulcer and osteomyelitis. He states he has been doing well, he had a f/u on the 16th and had no issues. The patient states he has been cleaning the incision site with no issues. On Sunday he had an episode of chills, fever, nausea and vomiting. This was treated and resolved with no issues. Today in the PM he stood and noted pain in the left leg. He took off his pants and noted swelling in the left LE. He went to the [facility] ER. He was diagnosed with diabetic foot ulcer and transferred to [facility]. History provided by patient who is alert and oriented.   Hospital Course:  *Foot osteomyelitis  Confirmed with MRI left foot 1.2.2014 Started empirically on vanc and zosyn. Discontinue zosyn and continue vancomycin for 6 weeks. End date Jan 30 2013. Appreciate ortho following; we will follow up on their recommendations  HTN (hypertension), benign  BP 160/81  start lisinopril. continue lasix, follow up with PCP.  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 Continue to monitor as an outpatient.  Peripheral neuropathy  Continue gabapentin.   Diabetes mellitus type 2, uncontrolled, with  complications  Hemoglobin A1c 8.7  Continue insulin 3 units TID and Lantuss 40 units Q HS     Procedures: MRI: osteomyelitis involving the fourth metatarsal head with marked surrounding soft tissue inflammation    Consultations:  ortho  Discharge Exam: Filed Vitals:   12/19/12 2149 12/20/12 0628 12/20/12 1558 12/20/12 2131  BP: 164/72 136/70 160/81 163/71  Pulse: 75 67 71 73  Temp: 97.9 F (36.6 C) 98 F (36.7 C) 98 F (36.7 C) 98.1 F (36.7 C)  TempSrc: Oral Oral  Oral  Resp: 18 20 20 19   Height:      Weight:      SpO2: 98% 98% 100% 99%    General: A&Ox3 Cardiovascular: RRR Respiratory: good air movement CTA B/L  Discharge Instructions     Medication List     As of 12/21/2012 12:15 PM    ASK your doctor about these medications         freestyle lancets   PRN      furosemide 40 MG tablet   Commonly known as: LASIX   Take 1 tablet (40 mg total) by mouth daily.      glucagon 1 MG injection   Inject 1 mg into the vein once as needed. For hypoglycemia      glucose blood test strip   PRN      insulin aspart 100 UNIT/ML injection   Commonly known as: novoLOG   Inject 12 Units into the skin 3 (three) times daily before meals. Sliding scale in addition if needed      insulin glargine 100 UNIT/ML injection   Commonly  known as: LANTUS   Inject 42 Units into the skin at bedtime.      Insulin Pen Needle 32G X 6 MM Misc   Use with insulin pen for 4 injections daily           Follow-up Information    Follow up with MCGOWEN,PHILIP H, MD. In 2 weeks. (hospital follow )    Contact information:   [facility] [address] [place] [address] [phone]           The results of significant diagnostics from this hospitalization (including imaging, microbiology, ancillary and laboratory) are listed below for reference.    Significant Diagnostic Studies: Dg Tibia/fibula Left  12/19/2012  *RADIOLOGY REPORT*  Clinical Data: Knee pain  LEFT  TIBIA AND FIBULA - 2 VIEW  Comparison: None.  Findings: No osseous abnormality of the left tibia or fibula.  No soft tissue abnormality.  Knee joint and ankle joint demonstrate mild degenerative change.  IMPRESSION: No acute osseous abnormality.   Original Report Authenticated By: Genevive Bi, M.D.    Mr Foot Left W Wo Contrast  12/20/2012  *RADIOLOGY REPORT*  Clinical Data: Foot pain, swelling and open wound.  History of prior fifth toe amputation  MRI OF THE LEFT FOREFOOT WITHOUT AND WITH CONTRAST  Technique:  Multiplanar, multisequence MR imaging was performed both before and after administration of intravenous contrast.  Contrast: 20mL MULTIHANCE GADOBENATE DIMEGLUMINE 529 MG/ML IV SOLN  Comparison: 10/22/2012.  Findings: There are surgical changes related to recent fifth toe amputation through the level of the mid shaft of the fifth metatarsal. The fourth toe has also been amputated but the metatarsal is intact.  The fourth metatarsal head demonstrates increased T2 signal intensity and enhancement which is worrisome for osteomyelitis.  There is an open wound on the plantar aspect of the foot at the level of the fourth metatarsal head. There is also surrounding soft tissue inflammation and enhancement.  There is signal abnormality and some enhancement in the area of the amputated fifth metatarsal but this is likely postoperative change. And also any obvious findings for residual osteomyelitis.  There is severe diffuse cellulitis.  Mild myositis.  No findings for septic arthritis.  IMPRESSION:  1.  MR findings consistent with osteomyelitis involving the fourth metatarsal head with marked surrounding soft tissue inflammation but no discrete drainable soft tissue abscess. 2.  Surgical change related to fourth and fifth toe amputations and partial fifth metatarsal amputation. 3.  Severe diffuse cellulitis and mild myositis.   Original Report Authenticated By: Rudie Meyer, M.D.    US Venous Img Lower  Unilateral Left  12/18/2012  *RADIOLOGY REPORT*  Clinical Data: Left knee pain and lower leg pain, swelling and redness  LEFT LOWER EXTREMITY VENOUS DUPLEX ULTRASOUND  Technique:  Gray-scale sonography with graded compression, as well as color Doppler and duplex ultrasound were performed to evaluate the deep venous system of the lower extremity from the level of the common femoral vein through the popliteal and proximal calf veins. Spectral Doppler was utilized to evaluate flow at rest and with distal augmentation maneuvers.  Comparison:  None.  Findings:  Normal compressibility of the common femoral, superficial femoral, and popliteal veins is demonstrated, as well as the visualized proximal calf veins.  No filling defects to suggest DVT on grayscale or color Doppler imaging.  Doppler waveforms show normal direction of venous flow, normal respiratory phasicity and response to augmentation.  IMPRESSION: No evidence of lower extremity deep vein thrombosis.  Original Report Authenticated By: Genevive Bi, M.D.    Dg Knee Complete 4 Views Left  12/19/2012  *RADIOLOGY REPORT*  Clinical Data: Sudden onset left knee pain  LEFT KNEE - COMPLETE 4+ VIEW  Comparison: None  Findings: No fracture or dislocation of the left knee.  No joint effusion.  There is mild spurring of the medial and lateral compartment as well as the patellofemoral compartment.  IMPRESSION:  1.  No acute findings left knee. 2.  Mild tricompartment osteoarthritis.   Original Report Authenticated By: Genevive Bi, M.D.     Microbiology: Recent Results (from the past 240 hour(s))  CULTURE, BLOOD (ROUTINE X 2)     Status: Normal (Preliminary result)   Collection Time   12/19/12  1:15 AM      Component Value Range Status Comment   Specimen Description BLOOD RIGHT ANTECUBITAL 10cc   Final    Special Requests BOTTLES DRAWN AEROBIC AND ANAEROBIC   Final    Culture  Setup Time 12/19/2012 06:57   Final    Culture     Final    Value:         BLOOD CULTURE RECEIVED NO GROWTH TO DATE CULTURE WILL BE HELD FOR 5 DAYS BEFORE ISSUING A FINAL NEGATIVE REPORT   Report Status PENDING   Incomplete   CULTURE, BLOOD (ROUTINE X 2)     Status: Normal (Preliminary result)   Collection Time   12/19/12  2:05 AM      Component Value Range Status Comment   Specimen Description BLOOD LEFT HAND   Final    Special Requests BOTTLES DRAWN AEROBIC AND ANAEROBIC 5cc   Final    Culture  Setup Time 12/19/2012 06:57   Final    Culture     Final    Value:        BLOOD CULTURE RECEIVED NO GROWTH TO DATE CULTURE WILL BE HELD FOR 5 DAYS BEFORE ISSUING A FINAL NEGATIVE REPORT   Report Status PENDING   Incomplete      Labs: Basic Metabolic Panel:  Lab 12/20/12 4098 12/19/12 0803 12/19/12 0116  NA 141 138 139  K 3.6 3.6 3.3*  CL 105 104 103  CO2 27 26 27   GLUCOSE 158* 217* 217*  BUN 14 19 21   CREATININE 0.95 0.76 0.80  CALCIUM 8.3* 8.0* 8.3*  MG -- -- --  PHOS -- -- --   Liver Function Tests: No results found for this basename: AST:5,ALT:5,ALKPHOS:5,BILITOT:5,PROT:5,ALBUMIN:5 in the last 168 hours No results found for this basename: LIPASE:5,AMYLASE:5 in the last 168 hours No results found for this basename: AMMONIA:5 in the last 168 hours CBC:  Lab 12/20/12 0540 12/19/12 0116  WBC 3.6* 4.0  NEUTROABS -- 2.8  HGB 9.9* 10.2*  HCT 28.9* 30.0*  MCV 85.3 87.0  PLT 83* 76*   Cardiac Enzymes: No results found for this basename: CKTOTAL:5,CKMB:5,CKMBINDEX:5,TROPONINI:5 in the last 168 hours BNP: BNP (last 3 results) No results found for this basename: PROBNP:3 in the last 8760 hours CBG:  Lab 12/21/12 1150 12/21/12 0749 12/20/12 2209 12/20/12 1735 12/20/12 1230  GLUCAP 237* 152* 258* 174* 133*       Signed:  FELIZ ORTIZ, ABRAHAM  [facility] 12/21/2012, 12:15 PM

## 2012-12-21 NOTE — Progress Notes (Signed)
Peripherally Inserted Central Catheter/Midline Placement  The IV Nurse has discussed with the patient and/or persons authorized to consent for the patient, the purpose of this procedure and the potential benefits and risks involved with this procedure.  The benefits include less needle sticks, lab draws from the catheter and patient may be discharged home with the catheter.  Risks include, but not limited to, infection, bleeding, blood clot (thrombus formation), and puncture of an artery; nerve damage and irregular heat beat.  Alternatives to this procedure were also discussed.  PICC/Midline Placement Documentation        [name] 12/21/2012, 11:26 AM

## 2012-12-21 NOTE — Progress Notes (Signed)
[facility]  Patient Status: New  [facility] is providing the following services: RN and Home Infusion Services (teaching and education will be done by nurse in the home with patient and caregiver)  If patient discharges after hours, please call [phone].   [name] 12/21/2012, 10:45 AM

## 2012-12-21 NOTE — Progress Notes (Signed)
ANTIBIOTIC CONSULT NOTE - Follow up  Pharmacy Consult for Vancomycin and Zosyn  Indication: cellulitis  Allergies  Allergen Reactions  . Naproxen Nausea And Vomiting  . Other Itching and Other (See Comments)    PERFUMES - SNEEZING    Patient Measurements: Height: 5\' 10"  (177.8 cm) Weight: 210 lb (95.255 kg) IBW/kg (Calculated) : 73   Vital Signs:    Labs:  Basename 12/20/12 0540 12/19/12 0803 12/19/12 0116  WBC 3.6* -- 4.0  HGB 9.9* -- 10.2*  PLT 83* -- 76*  LABCREA -- -- --  CREATININE 0.95 0.76 0.80   Estimated Creatinine Clearance: 107.8 ml/min (by C-G formula based on Cr of 0.95).  Basename 12/21/12 0818  VANCOTROUGH 21.8*  VANCOPEAK --  Drue Dun --  GENTTROUGH --  GENTPEAK --  GENTRANDOM --  TOBRATROUGH --  Nolen Mu --  TOBRARND --  AMIKACINPEAK --  AMIKACINTROU --  AMIKACIN --     Microbiology: Recent Results (from the past 720 hour(s))  CULTURE, BLOOD (ROUTINE X 2)     Status: Normal (Preliminary result)   Collection Time   12/19/12  1:15 AM      Component Value Range Status Comment   Specimen Description BLOOD RIGHT ANTECUBITAL 10cc   Final    Special Requests BOTTLES DRAWN AEROBIC AND ANAEROBIC   Final    Culture  Setup Time 12/19/2012 06:57   Final    Culture     Final    Value:        BLOOD CULTURE RECEIVED NO GROWTH TO DATE CULTURE WILL BE HELD FOR 5 DAYS BEFORE ISSUING A FINAL NEGATIVE REPORT   Report Status PENDING   Incomplete   CULTURE, BLOOD (ROUTINE X 2)     Status: Normal (Preliminary result)   Collection Time   12/19/12  2:05 AM      Component Value Range Status Comment   Specimen Description BLOOD LEFT HAND   Final    Special Requests BOTTLES DRAWN AEROBIC AND ANAEROBIC 5cc   Final    Culture  Setup Time 12/19/2012 06:57   Final    Culture     Final    Value:        BLOOD CULTURE RECEIVED NO GROWTH TO DATE CULTURE WILL BE HELD FOR 5 DAYS BEFORE ISSUING A FINAL NEGATIVE REPORT   Report Status PENDING   Incomplete      Assessment: 52 year old man on vancomycin for osteomyelitis of left foot to continue on vancomycin for an extended length of therapy.  Trough drawn this morning is 21.8 mg/L on a dose of 1g IV q8h  Goal of Therapy:  Vancomycin trough 15-20  Plan:  Decrease vancomycin dose to 1g IV q12. I would repeat a vancomycin trough and BMET January 8 then per home health protocol based on levels.   [name] 12/21/2012,9:59 AM

## 2012-12-21 NOTE — Care Management Note (Signed)
  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 Cellulitis left foot     Action/Plan:   IV Vancomycin and Zosyn   Anticipated DC Date:  12/21/2012   Anticipated DC Plan:  HOME W HOME HEALTH SERVICES         Choice offered to / List presented to:          [facility] arranged  HH-1 RN      [facility] agency  [facility]   Status of service:  Completed, signed off Medicare Important Message given?   (If response is "NO", the following Medicare IM given date fields will be blank) Date Medicare IM given:   Date Additional Medicare IM given:    Discharge Disposition:    Per UR Regulation:  Reviewed for med. necessity/level of care/duration of stay  If discussed at Long Length of Stay Meetings, dates discussed:    Comments:  12-21-12 Plan to discharge patient home today after PICC line placement.  Vancomycin 1 gram IV Q 12 hours for 2 weeks ( first dose was given 1-1-4 ) . Patient will receive dose at the hospital tonight at 1800 and then [facility] will start in am .  MD please write [facility] order  and prescription including duration .  Facesheet information confirmed with patient .  Thanks  Ronny Flurry RN BSn [phone]

## 2012-12-21 NOTE — Progress Notes (Signed)
Patient discharged to home in care of sister and with [facility]. Medications and instructions reviewed with patient with all questions answered. IV d/c'd with cath intact and dressing CDI. PICC line in place and flushed by IV team for d/c. Assessment unchanged from this am. Patient is to follow up with Dr. Marvel Plan (PCP) in 2 weeks.

## 2012-12-25 ENCOUNTER — Other Ambulatory Visit: Payer: Self-pay | Admitting: Family Medicine

## 2012-12-25 ENCOUNTER — Telehealth: Payer: Self-pay | Admitting: Family Medicine

## 2012-12-25 LAB — CULTURE, BLOOD (ROUTINE X 2): Culture: NO GROWTH

## 2012-12-25 NOTE — Telephone Encounter (Signed)
Pls call pt and try to get any specifics on his orthopedic and infectious disease f/u as he understands it. Once his home health agency sends [place] some info, we'll contact his nurse there and make sure we are straight on what MD is handling what (vanc dosing/monitoring, etc).  He'll need f/u in my office to review some things if I am going to have to fill out that insurance form.  However, tell him it would actually be more appropriate this time for his orthopedist to fill it out b/c this particular problem has been handles exclusively by them and they would be calling the shots regarding decisions to return to work, restrictions, plan for care for this problem, etc.---thx

## 2012-12-26 NOTE — Telephone Encounter (Signed)
I spoke to pt and he has an appt with the surgeon tomorrow for follow up.  He will call for hosp. follow up appt with [place] for next week.   Pt saw the surgeon on 12/19 and was written out of work until 01/03/13.  This paperwork was submitted by the surgeon's office.  His hospital stay was after this paperwork was filled out.  Advised pt that it would be best if any continuances were needed that the surgeon would know better what dates pt would be able to go back and any restrictions he has.  He will ask the surgeon tomorrow. Pt also states he is beginning to have hives on shoulders, arms and now chest.  Pt has PICC line and rash is worse around that area.  He is only using PICC for vanc and is flushing PICC line before and after.  Pharmacy for [facility] also called this morning stating pt was having this side effect.  After speaking with the patient, I think he was the one who asked the pharmacist to call us to let us know about this. Do you want me to call [facility] and get more info from the pharmacy about dose and who is prescribing?  Please advise.

## 2012-12-26 NOTE — Telephone Encounter (Signed)
Yes, find out who is the prescribing MD for his vancomycin because they are the ones who need to know about the hives.  Also, we need home health nursing to examine what he is talking about and report what they think (true hives?, red man syndrome?, other rash?).  --thx

## 2012-12-28 NOTE — Telephone Encounter (Signed)
I spoke to [facility] yesterday who stated the rash was upper arms, shoulders, worse area was around PICC line.  Larita Fife has order for 10 days of Vancomycin.  Larita Fife has processed enough meds for the patient to have doses through today.  He will need more to get to the thirteenth as per there original order.  Larita Fife gave me name of nurse, Kriste Basque who has been to see the patient.  Message left for [facility].  RC from [facility] and she has never seen the rash.  He has been seen by one other nurse who cleaned/changed dressing and did not mention any rash.    Please advise vancomycin order.

## 2012-12-28 NOTE — Telephone Encounter (Signed)
I spoke to [employer] and gave verbal per Dr. [name] order to continue Vancomycin.  End date is 01/30/13, not 12/31/12.  He will change order.

## 2013-01-01 DIAGNOSIS — E1169 Type 2 diabetes mellitus with other specified complication: Secondary | ICD-10-CM

## 2013-01-01 DIAGNOSIS — M9080 Osteopathy in diseases classified elsewhere, unspecified site: Secondary | ICD-10-CM

## 2013-01-01 DIAGNOSIS — L97409 Non-pressure chronic ulcer of unspecified heel and midfoot with unspecified severity: Secondary | ICD-10-CM

## 2013-01-01 DIAGNOSIS — E1149 Type 2 diabetes mellitus with other diabetic neurological complication: Secondary | ICD-10-CM

## 2013-02-03 NOTE — Telephone Encounter (Signed)
Can we request these notes again please.  I have not received anything.-thx

## 2013-02-04 NOTE — Telephone Encounter (Signed)
Requested records from Dr. Lestine Box on 02/04/13

## 2013-02-14 ENCOUNTER — Encounter: Payer: Self-pay | Admitting: Family Medicine

## 2013-02-19 ENCOUNTER — Other Ambulatory Visit: Payer: Self-pay | Admitting: Family Medicine

## 2013-02-19 ENCOUNTER — Ambulatory Visit (HOSPITAL_BASED_OUTPATIENT_CLINIC_OR_DEPARTMENT_OTHER)
Admission: RE | Admit: 2013-02-19 | Discharge: 2013-02-19 | Disposition: A | Discharge status: Home / Self Care | Payer: Managed Care, Other (non HMO) | Source: Ambulatory Visit | Attending: Family Medicine | Admitting: Family Medicine

## 2013-02-19 ENCOUNTER — Encounter: Payer: Self-pay | Admitting: Family Medicine

## 2013-02-19 ENCOUNTER — Ambulatory Visit (INDEPENDENT_AMBULATORY_CARE_PROVIDER_SITE_OTHER): Payer: Managed Care, Other (non HMO) | Admitting: Family Medicine

## 2013-02-19 VITALS — BP 118/62 | HR 96 | Temp 101.6°F | Resp 14 | Wt 227.5 lb

## 2013-02-19 DIAGNOSIS — R509 Fever, unspecified: Secondary | ICD-10-CM | POA: Insufficient documentation

## 2013-02-19 DIAGNOSIS — R739 Hyperglycemia, unspecified: Secondary | ICD-10-CM | POA: Insufficient documentation

## 2013-02-19 DIAGNOSIS — R7309 Other abnormal glucose: Secondary | ICD-10-CM

## 2013-02-19 LAB — CBC WITH DIFFERENTIAL/PLATELET
Eosinophils Relative: 0 % (ref 0–5)
HCT: 31.2 % — ABNORMAL LOW (ref 39.0–52.0)
Hemoglobin: 10.3 g/dL — ABNORMAL LOW (ref 13.0–17.0)
Lymphocytes Relative: 1 % — ABNORMAL LOW (ref 12–46)
Lymphs Abs: 0.1 10*3/uL — ABNORMAL LOW (ref 0.7–4.0)
MCV: 86.9 fL (ref 78.0–100.0)
Monocytes Absolute: 0.3 10*3/uL (ref 0.1–1.0)
Monocytes Relative: 4 % (ref 3–12)
Neutro Abs: 8.4 10*3/uL — ABNORMAL HIGH (ref 1.7–7.7)
WBC: 8.9 10*3/uL (ref 4.0–10.5)

## 2013-02-19 LAB — COMPREHENSIVE METABOLIC PANEL
ALT: 25 U/L (ref 0–53)
Albumin: 3 g/dL — ABNORMAL LOW (ref 3.5–5.2)
CO2: 23 mEq/L (ref 19–32)
Chloride: 101 mEq/L (ref 96–112)
Glucose, Bld: 257 mg/dL — ABNORMAL HIGH (ref 70–99)
Potassium: 4.3 mEq/L (ref 3.5–5.3)
Sodium: 132 mEq/L — ABNORMAL LOW (ref 135–145)
Total Bilirubin: 1.2 mg/dL (ref 0.3–1.2)
Total Protein: 5.3 g/dL — ABNORMAL LOW (ref 6.0–8.3)

## 2013-02-19 LAB — POCT URINALYSIS DIPSTICK
Glucose, UA: NEGATIVE
Spec Grav, UA: >=1.03
Urobilinogen, UA: 1
pH, UA: 5

## 2013-02-19 LAB — GLUCOSE, POCT (MANUAL RESULT ENTRY): POC Glucose: 288 mg/dl — AB (ref 70–99)

## 2013-02-19 MED ORDER — OSELTAMIVIR PHOSPHATE 75 MG PO CAPS: 75.0000 mg | ORAL_CAPSULE | Freq: Two times a day (BID) | ORAL | Status: DC

## 2013-02-19 MED ORDER — ACETAMINOPHEN 500 MG PO TABS: 1000.0000 mg | ORAL_TABLET | Freq: Four times a day (QID) | ORAL | Status: DC | PRN

## 2013-02-19 MED ORDER — LEVOFLOXACIN 500 MG PO TABS: 500.0000 mg | ORAL_TABLET | Freq: Every day | ORAL | Status: DC

## 2013-02-19 NOTE — Progress Notes (Signed)
OFFICE NOTE  02/19/2013  CC: No chief complaint on file.    HPI: Patient is a 52 y.o. Caucasian male who is here for fever. Onset this morning upon awakening around 5 AM, shaking/chills, found to have T 103.1 at work so his employer brought him in today. CBGs checked at work 228, 236, and 215.   Low back was aching this morning but otherwise no muscle aches. Last night he had no appetite, which is unusual for him.   No cough, no HA, no ST, no significant nasal sx's. No dysuria, no urgency, no hematuria, no urinary frequency.  BM brown, occuring regularly.  No rash. No known sick contacts.  He has had flu vaccine this season.  He is UTD on pneumovax and Tdap. He has been compliant with all meds except he skipped lantus and supper novolog yesterday but took his novolog (12 U) for BF this am at 8 and then ate his BF. Glucoses usually 140s range in AM but gets lower throughout the day b/c he doesn't eat well on certain shifts.  Feet have not been bothering him at all and he has been taking care of his LE venous stasis dermatitis well.   Pertinent PMH:  Past Medical History  Diagnosis Date  . Heart murmur     ECHO 07/2011 showed mild AS and LVH.  TEE 09/2011 showed small PFO, bicuspid aortic valve but no stenosis or regurg.  . 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  F/u u/s for ongoing/worsening thrombocytopenia showed no more splenomegaly.  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  Gen surg and GI recommended flagyl and cipro x 2 wks..  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  . Cholelithiasis 09/2011  . Alcoholism   . Peripheral neuropathy 2012    Diabetic:  Autonomic (pelvic) and LE polyneuropathy--[facility] testing showed that it is likely from  DM  . 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 Arthritis     back L3 - L5  . Hyperactive gag reflex   . History of pyelonephritis 09/2011  . Diabetes mellitus     IDDM  . Wears dentures     upper  . Osteomyelitis of toe of left foot 10/22/12    left 4th and 5th toes--now s/p amputation of these areas   Past Surgical History  Procedure Laterality Date  . Knee arthroscopy  2001    Bilateral  . Amputation  10/24/2012    Procedure: AMPUTATION RAY;  Surgeon: Sherri Rad, MD;  Location: [facility];  Service: Orthopedics;  Laterality: Left;  left 4th toe amputation through MTP joint, 5th Ray amputation   . Knee surgery  2001   Past family and social history reviewed and there are no changes since the patient's last office visit with me.  MEDS:  Outpatient Prescriptions Prior to Visit  Medication Sig Dispense Refill  . furosemide (LASIX) 40 MG tablet Take 1 tablet (40 mg total) by mouth daily.  90 tablet  1  . glucagon 1 MG injection Inject 1 mg into the vein once as needed. For hypoglycemia      . glucose blood (FREESTYLE LITE) test strip PRN  32 each  12  . insulin  aspart (NOVOLOG) 100 UNIT/ML injection Inject 12 Units into the skin 3 (three) times daily before meals. Sliding scale in addition if needed      . insulin glargine (LANTUS) 100 UNIT/ML injection Inject 42 Units into the skin at bedtime.       . Lancets (FREESTYLE) lancets PRN  100 each  5  . lisinopril (PRINIVIL,ZESTRIL) 20 MG tablet Take 1 tablet (20 mg total) by mouth daily.  30 tablet  0  . NOVOFINE 32G X 6 MM MISC USE WITH INSULIN PEN FOR 4 INJECTIONS DAILY  120 each  8  . HYDROcodone-acetaminophen (NORCO) 5-325 MG per tablet Take 1 tablet by mouth every 6 (six) hours as needed for pain. One to two tabs every 4-6 hours for pain  30 tablet  0  . insulin glargine (LANTUS) 100 UNIT/ML injection Inject 40 Units into the skin at bedtime.  10 mL    . potassium chloride SA  (K-DUR,KLOR-CON) 20 MEQ tablet Take 2 tablets (40 mEq total) by mouth once.  30 tablet  0  . vancomycin (VANCOCIN) 1 GM/200ML SOLN Inject 200 mLs (1,000 mg total) into the vein every 12 (twelve) hours.  4000 mL  0   No facility-administered medications prior to visit.  **Not taking vancomycin or potassium anymore.  ROS: see HPI  PE: Blood pressure 118/62, pulse 96, temperature 101.6 F (38.7 C), resp. rate 14, weight 227 lb 8 oz (103.193 kg), SpO2 98.00%. Gen: Alert, well appearing.  Patient is oriented to person, place, time, and situation. AFFECT: pleasant, lucid thought and speech. ENT: Ears: EACs clear, normal epithelium.  TMs with good light reflex and landmarks bilaterally.  Eyes: no injection, icteris, swelling, or exudate.  EOMI, PERRLA. Nose: no drainage or turbinate edema/swelling.  No injection or focal lesion.  Mouth: lips without lesion/swelling.  Oral mucosa pink and moist.  Dentition intact and without obvious caries or gingival swelling. Oropharynx without erythema, exudate, or swelling.  Neck: supple/nontender.  No LAD. CV: RRR, 2/6 systolic murmur heard at cardiac base, best heart at RUSB and radiating into right subclavicular region.  No diastolic murmur.  S1 and S2 normal.  No rub or gallop.  (this murmur is consistent with his past exams). LUNGS: CTA bilat, nonlabored resps, good aeration in all lung fields. ABD: soft, NT (question of mild discomfort with palpation over his suprapubic region), ND, BS normal.  No hepatospenomegaly or mass.  No bruits.  No sign of ascites. EXT: no clubbing or cyanosis.  Bilateral 1+ pitting edema in LL's, with diffuse bronzing stasis dermatitis skin changes.  No erythema or tenderness.  No skin breakdown. Musculoskeletal: no joint swelling, erythema, warmth, or tenderness.  ROM of all joints intact. Minimal TTP in lower left lumbar soft tissues diffusely. Skin - no sores or suspicious lesions or rashes or color changes  LABs:   CBG here  today: 298 CC UA today showed large blood, >300 mg/dl protein, SG >7.253  IMPRESSION AND PLAN:  Fever in adult Nontoxic patient, but his liver dysfunction and poorly controlled DM 2 put him at high risk for SBI and/or complication from viral syndrome such as influenza.  He is so early in the course of the illness that it is difficult to decide how aggressively to proceed. Will send him to [facility] for CBC, CMET, blood cultures x 2, CXR, UA/urine culture, and will start empiric levaquin 500mg  qd x 7d and tamiflu 75mg  bid x 5d. F/u in office in 2d.   I  did give him 1000 mg tylenol while here in office today due to T 101 (his last dose of tylenol prior to this dose was approx 5 hours. Signs/symptoms to call or return for were reviewed and pt expressed understanding.   Hyperglycemia Combination of poor control of his DM already with superimposed acute stress response. Discussed insulin management over the next few days to minimize his chance of hypoglycemia if he has poor intake but also discussed avoiding under-treatment of hyperglycemia.   An After Visit Summary was printed and given to the patient.  60 minutes spent with patient today, with >50% of this time spent in counseling and care coordination for the above problems.  FOLLOW UP: 2d

## 2013-02-19 NOTE — Assessment & Plan Note (Addendum)
Nontoxic patient, but his liver dysfunction and poorly controlled DM 2 put him at high risk for SBI and/or complication from viral syndrome such as influenza.  He is so early in the course of the illness that it is difficult to decide how aggressively to proceed. Will send him to med center [facility] for CBC, CMET, blood cultures x 2, CXR, UA/urine culture, and will start empiric levaquin 500mg  qd x 7d and tamiflu 75mg  bid x 5d. F/u in office in 2d.   I did give him 1000 mg tylenol while here in office today due to T 101 (his last dose of tylenol prior to this dose was approx 5 hours. Signs/symptoms to call or return for were reviewed and pt expressed understanding.

## 2013-02-19 NOTE — Assessment & Plan Note (Signed)
Combination of poor control of his DM already with superimposed acute stress response. Discussed insulin management over the next few days to minimize his chance of hypoglycemia if he has poor intake but also discussed avoiding under-treatment of hyperglycemia.

## 2013-02-20 LAB — URINE CULTURE: Organism ID, Bacteria: NO GROWTH

## 2013-02-21 ENCOUNTER — Ambulatory Visit (INDEPENDENT_AMBULATORY_CARE_PROVIDER_SITE_OTHER): Payer: Managed Care, Other (non HMO) | Admitting: Family Medicine

## 2013-02-21 ENCOUNTER — Encounter: Payer: Self-pay | Admitting: Family Medicine

## 2013-02-21 VITALS — BP 150/92 | HR 68 | Temp 97.3°F | Ht 70.0 in | Wt 220.0 lb

## 2013-02-21 DIAGNOSIS — K7460 Unspecified cirrhosis of liver: Secondary | ICD-10-CM

## 2013-02-21 DIAGNOSIS — E1165 Type 2 diabetes mellitus with hyperglycemia: Secondary | ICD-10-CM

## 2013-02-21 DIAGNOSIS — I10 Essential (primary) hypertension: Secondary | ICD-10-CM

## 2013-02-21 DIAGNOSIS — R509 Fever, unspecified: Secondary | ICD-10-CM

## 2013-02-21 DIAGNOSIS — N179 Acute kidney failure, unspecified: Secondary | ICD-10-CM

## 2013-02-21 DIAGNOSIS — E1159 Type 2 diabetes mellitus with other circulatory complications: Secondary | ICD-10-CM

## 2013-02-21 DIAGNOSIS — IMO0002 Reserved for concepts with insufficient information to code with codable children: Secondary | ICD-10-CM

## 2013-02-21 LAB — BASIC METABOLIC PANEL
Calcium: 8.8 mg/dL (ref 8.4–10.5)
Chloride: 105 mEq/L (ref 96–112)
Creatinine, Ser: 0.9 mg/dL (ref 0.4–1.5)
Sodium: 136 mEq/L (ref 135–145)

## 2013-02-21 LAB — CBC WITH DIFFERENTIAL/PLATELET
Basophils Absolute: 0 10*3/uL (ref 0.0–0.1)
Eosinophils Absolute: 0.1 10*3/uL (ref 0.0–0.7)
Hemoglobin: 10.8 g/dL — ABNORMAL LOW (ref 13.0–17.0)
Lymphocytes Relative: 11.7 % — ABNORMAL LOW (ref 12.0–46.0)
Lymphs Abs: 0.5 10*3/uL — ABNORMAL LOW (ref 0.7–4.0)
MCHC: 33.5 g/dL (ref 30.0–36.0)
Neutro Abs: 2.9 10*3/uL (ref 1.4–7.7)
Platelets: 94 10*3/uL — ABNORMAL LOW (ref 150.0–400.0)
RDW: 14.7 % — ABNORMAL HIGH (ref 11.5–14.6)

## 2013-02-21 LAB — HEMOGLOBIN A1C: Hgb A1c MFr Bld: 8 % — ABNORMAL HIGH (ref 4.6–6.5)

## 2013-02-21 MED ORDER — HYDRALAZINE HCL 10 MG PO TABS: ORAL_TABLET | ORAL | Status: DC

## 2013-02-21 NOTE — Assessment & Plan Note (Addendum)
With hx of portal htn/ascites.  Albumin is low but transaminases and bilirubin levels are normal. His spleen is palpable on exam and his platelets have dropped--consistent with congestion of the portal venous system. Recheck platelet count today. Will work on getting him back on propranolol, aldactone, and lasix in the future depending on HR/BP and renal function.

## 2013-02-21 NOTE — Assessment & Plan Note (Signed)
Glucose readings a bit improved last 12 hours. He is due for HbA1c today. Continue insulins at current dosing for now.

## 2013-02-21 NOTE — Assessment & Plan Note (Addendum)
Resolved.  Unclear etiology.  He is asymptomatic.  Blood and urine testing + CXR unrevealing except neutrophilia.   Will d/c tamiflu. I'll have him finish the levaquin course (at qod dosing currently, due to ARF.  If Cr improved today then we may be able to resume daily dosing). Will recheck CBC w/diff today. Since he has felt well and has been w/out fever for >24h will write note allowing him to return to work 02/22/13 as long as he has no further fever today or tonight.

## 2013-02-21 NOTE — Progress Notes (Signed)
OFFICE NOTE  02/21/2013  CC:  Chief Complaint  Patient presents with  . Follow-up    fever     HPI: Patient is a 52 y.o. Caucasian male who is here for 2d f/u febrile illness.  He got fever "over 100" 2 nights ago, then during daytime and last night his Temp has not gone over 100 and he has not had any rigors like at the onset of his illness. One loose BM last night but this morning he had a normal BM.  CBG last night 158.  This am it was 162. No bp measurements from home to report.  Pertinent PMH:  Past Medical History  Diagnosis Date  . Heart murmur     ECHO 07/2011 showed mild AS and LVH.  TEE 09/2011 showed small PFO, bicuspid aortic valve but no stenosis or regurg.  . 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  F/u u/s for ongoing/worsening thrombocytopenia showed no more splenomegaly.  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  Gen surg and GI recommended flagyl and cipro x 2 wks..  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  . Cholelithiasis 09/2011  . Alcoholism   . Peripheral neuropathy 2012    Diabetic:  Autonomic (pelvic) and LE polyneuropathy--[facility] testing showed that it is likely from DM  . 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 Arthritis     back L3 - L5  . Hyperactive gag reflex   . History of pyelonephritis 09/2011  . Diabetes mellitus     IDDM  . Wears dentures     upper  . Osteomyelitis of toe of left foot 10/22/12    left 4th and 5th toes--now s/p amputation of these areas    MEDS:  Outpatient Prescriptions Prior to Visit  Medication Sig Dispense Refill  . gabapentin (NEURONTIN) 300 MG capsule Take 300 mg by mouth 2 (two) times daily.      [name] glucagon 1 MG injection Inject 1 mg into the  vein once as needed. For hypoglycemia      . glucose blood (FREESTYLE LITE) test strip PRN  32 each  12  . HYDROcodone-acetaminophen (NORCO) 5-325 MG per tablet Take 1 tablet by mouth every 6 (six) hours as needed for pain. One to two tabs every 4-6 hours for pain  30 tablet  0  . insulin aspart (NOVOLOG) 100 UNIT/ML injection Inject 12 Units into the skin 3 (three) times daily before meals. Sliding scale in addition if needed      . Lancets (FREESTYLE) lancets PRN  100 each  5  . levofloxacin (LEVAQUIN) 500 MG tablet Take 1 tablet (500 mg total) by mouth daily. Dispense generic please  7 tablet  0  . NOVOFINE 32G X 6 MM MISC USE WITH INSULIN PEN FOR 4 INJECTIONS DAILY  120 each  8  . oseltamivir (TAMIFLU) 75 MG capsule Take 1 capsule (75 mg total) by mouth 2 (two) times daily.  10 capsule  0  . potassium chloride SA (K-DUR,KLOR-CON) 20 MEQ tablet Take 2 tablets (40 mEq total) by mouth once.  30 tablet  0  . insulin glargine (LANTUS) 100 UNIT/ML injection Inject 40 Units into the skin at bedtime.  10 mL    .  furosemide (LASIX) 40 MG tablet Take 1 tablet (40 mg total) by mouth daily.  90 tablet  1  . lisinopril (PRINIVIL,ZESTRIL) 20 MG tablet Take 1 tablet (20 mg total) by mouth daily.  30 tablet  0  . insulin glargine (LANTUS) 100 UNIT/ML injection Inject 42 Units into the skin at bedtime.       . vancomycin (VANCOCIN) 1 GM/200ML SOLN Inject 200 mLs (1,000 mg total) into the vein every 12 (twelve) hours.  4000 mL  0   No facility-administered medications prior to visit.    PE: Blood pressure 150/92, pulse 68, temperature 97.3 F (36.3 C), temperature source Temporal, height 5\' 10"  (1.778 m), weight 220 lb (99.791 kg). Gen: Alert, well appearing.  Patient is oriented to person, place, time, and situation. ENT: Ears: EACs clear, normal epithelium.  TMs with good light reflex and landmarks bilaterally.  Eyes: no injection, icteris, swelling, or exudate.  EOMI, PERRLA. Nose: no drainage or  turbinate edema/swelling.  No injection or focal lesion.  Mouth: lips without lesion/swelling.  Oral mucosa pink and moist.  Dentition intact and without obvious caries or gingival swelling.  Oropharynx without erythema, exudate, or swelling.  Neck - No masses or thyromegaly or limitation in range of motion CV: RRR, 2/6 systolic murmur unchanged.  No diastolic murmur.  LUNGS: CTA bilat, nonlabored resps, good aeration in all lung fields. ABD: soft, NT, ND, BS normal. I can feel his spleen tip about 2 cm below left costal margin.  No masses.  No bruits. EXT: right leg w/out edema, left leg with 1+ edema.  Diffuse bronzing.     IMPRESSION AND PLAN:  Fever in adult Resolved.  Unclear etiology.  He is asymptomatic.  Blood and urine testing + CXR unrevealing except neutrophilia.   Will d/c tamiflu. I'll have him finish the levaquin course (at qod dosing currently, due to ARF.  If Cr improved today then we may be able to resume daily dosing). Will recheck CBC w/diff today. Since he has felt well and has been w/out fever for >24h will write note allowing him to return to work 02/22/13 as long as he has no further fever today or tonight.  HTN (hypertension), benign BP is back up since he stopped lisinopril and lasix. Will continue to hold these meds due to ARF (rechecking BMET today). Will start hydralazine 10mg  bid and have him monitor bp at home daily. May be able to restart ACE-I and lasix in the future but want to make sure his renal function has been stable for at least a couple of weeks before doing so.  Diabetes mellitus type 2, uncontrolled, with complications Glucose readings a bit improved last 12 hours. He is due for HbA1c today. Continue insulins at current dosing for now.  Cirrhosis With hx of portal htn/ascites.  Albumin is low but transaminases and bilirubin levels are normal. His spleen is palpable on exam and his platelets have dropped--consistent with congestion of the portal  venous system. Recheck platelet count today. Will work on getting him back on propranolol, aldactone, and lasix in the future depending on HR/BP and renal function.  ARF (acute renal failure) Cr 12/2012 was 0.95. Cr two days ago was 1.75.  BUN/Cr is 20--elevated. Prerenal azotemia vs intrinsic renal pathology.  Also, he could simply have accelerated renal insufficiency from his chronically poorly controlled DM. He has a hx of left kidney pyelo/abscess findings on CT 09/2011, with mild echogenic debris still noted in left renal pelvis region on  u/s abdomen 09/2012.  Will get f/u renal u/s if BUN/Cr not back to normal. Will recheck BMET today.  Considering referral to nephrologist, esp if BUN/Cr worse today.   An After Visit Summary was printed and given to the patient.  FOLLOW UP: 2 wks

## 2013-02-21 NOTE — Patient Instructions (Addendum)
Stop tamiflu Finish levaquin. Continue holding lisinopril and lasix. Start new bp med called hydralizine.

## 2013-02-21 NOTE — Assessment & Plan Note (Signed)
BP is back up since he stopped lisinopril and lasix. Will continue to hold these meds due to ARF (rechecking BMET today). Will start hydralazine 10mg  bid and have him monitor bp at home daily. May be able to restart ACE-I and lasix in the future but want to make sure his renal function has been stable for at least a couple of weeks before doing so.

## 2013-02-21 NOTE — Assessment & Plan Note (Addendum)
Cr 12/2012 was 0.95. Cr two days ago was 1.75.  BUN/Cr is 20--elevated. Prerenal azotemia vs intrinsic renal pathology.  Also, he could simply have accelerated renal insufficiency from his chronically poorly controlled DM. He has a hx of left kidney pyelo/abscess findings on CT 09/2011, with mild echogenic debris still noted in left renal pelvis region on u/s abdomen 09/2012.  Will get f/u renal u/s if BUN/Cr not back to normal. Will recheck BMET today.  Considering referral to nephrologist, esp if BUN/Cr worse today.

## 2013-02-25 LAB — CULTURE, BLOOD (SINGLE)
Organism ID, Bacteria: NO GROWTH
Organism ID, Bacteria: NO GROWTH

## 2013-03-08 ENCOUNTER — Ambulatory Visit (INDEPENDENT_AMBULATORY_CARE_PROVIDER_SITE_OTHER): Payer: Managed Care, Other (non HMO) | Admitting: Family Medicine

## 2013-03-08 ENCOUNTER — Encounter: Payer: Self-pay | Admitting: Family Medicine

## 2013-03-08 VITALS — BP 166/79 | HR 79 | Temp 98.4°F | Resp 16 | Wt 231.0 lb

## 2013-03-08 DIAGNOSIS — R509 Fever, unspecified: Secondary | ICD-10-CM

## 2013-03-08 DIAGNOSIS — I10 Essential (primary) hypertension: Secondary | ICD-10-CM

## 2013-03-08 DIAGNOSIS — R7989 Other specified abnormal findings of blood chemistry: Secondary | ICD-10-CM

## 2013-03-08 DIAGNOSIS — K7460 Unspecified cirrhosis of liver: Secondary | ICD-10-CM

## 2013-03-08 DIAGNOSIS — R799 Abnormal finding of blood chemistry, unspecified: Secondary | ICD-10-CM

## 2013-03-08 DIAGNOSIS — N179 Acute kidney failure, unspecified: Secondary | ICD-10-CM

## 2013-03-08 LAB — BASIC METABOLIC PANEL
Calcium: 8.5 mg/dL (ref 8.4–10.5)
GFR: 75.93 mL/min (ref >60.00)
Potassium: 4.3 mEq/L (ref 3.5–5.1)
Sodium: 137 mEq/L (ref 135–145)

## 2013-03-08 MED ORDER — FUROSEMIDE 40 MG PO TABS: 40.0000 mg | ORAL_TABLET | Freq: Every day | ORAL | Status: DC

## 2013-03-08 NOTE — Assessment & Plan Note (Signed)
Not ideal control. Hypotension last night I think is likely a result of a vasovagal episode after his abd pain/vomiting.  He has shown signs of peripheral neuropathy and I suspect he has some autonomic neuropathy. Will keep him on lisinopril 20mg  qd and add back a 40mg  dose of lasix daily. If bp still up and no further episodes of hypotension at f/u in 85mo then I'll start adding his propranolol back for bp control and for portal HTN.

## 2013-03-08 NOTE — Assessment & Plan Note (Signed)
With hx of portal HTN and ascites. Will restart lasix 40mg  qd today. Restart propranolol at next f/u if no hypotension/bradycardia.

## 2013-03-08 NOTE — Progress Notes (Signed)
OFFICE NOTE  03/08/2013  CC:  Chief Complaint  Patient presents with  . Follow-up    2 wks     HPI: Patient is a 52 y.o. Caucasian male who is here for 2 wk f/u from recent acute febrile illness that spont resolved in <48h and involved no sx's other than fever.  He had bump of Cr briefly but this came right back into normal range quickly. He went out of town and I put him on hydralazine for bp control b/c I took him off his lisinopril short term due to his ARF.  He had rxn to hydalazine (hives) so we d/c'd this and restarted lisinopril 20mg  qd. His bp was normal or just mildly high after getting back on this med. Last night at work he had a brief episode of LLQ abd pain, vomited x 1 and felt better, gluc 220.  He then began to feel odd feeling in eyes--light sensitivity and "semi blurry".  No dizziness, but slight disequalibrium and was note to have small pupils and "staggering".  EMTs were called b/c his bp was 70s over 40s but he "felt fine".  His bp came up before EMTs left.   He has not taken his lasix since I saw him last.  Currently he reports feeling good.  Pertinent PMH:  Past Medical History  Diagnosis Date  . Heart murmur     ECHO 07/2011 showed mild AS and LVH.  TEE 09/2011 showed small PFO, bicuspid aortic valve but no stenosis or regurg.  . 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  F/u u/s for ongoing/worsening thrombocytopenia showed no more splenomegaly.  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  Gen surg and GI recommended flagyl and cipro x 2 wks..  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  . Cholelithiasis 09/2011  . Alcoholism   . Peripheral neuropathy 2012    Diabetic:  Autonomic (pelvic) and LE polyneuropathy--[facility] testing  showed that it is likely from DM  . 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 Arthritis     back L3 - L5  . Hyperactive gag reflex   . History of pyelonephritis 09/2011  . Diabetes mellitus     IDDM  . Wears dentures     upper  . Osteomyelitis of toe of left foot 10/22/12    left 4th and 5th toes--now s/p amputation of these areas  . Diabetic retinopathy associated with type 2 diabetes mellitus     Laser tx in both eyes    MEDS:  Outpatient Prescriptions Prior to Visit  Medication Sig Dispense Refill  . furosemide (LASIX) 40 MG tablet Take 1 tablet (40 mg total) by mouth daily.  90 tablet  1  . gabapentin (NEURONTIN) 300 MG capsule Take 300 mg by mouth 2 (two) times daily.      [name] glucagon 1 MG injection Inject 1 mg into the vein once as needed. For hypoglycemia      . glucose blood (FREESTYLE LITE) test strip PRN  32 each  12  . HYDROcodone-acetaminophen (NORCO) 5-325 MG per tablet Take 1 tablet by mouth every 6 (six) hours as needed for pain. One to two tabs every 4-6 hours for pain  30 tablet  0  . insulin aspart (NOVOLOG) 100 UNIT/ML injection Inject 12 Units into the skin 3 (three) times daily before meals. Sliding scale in addition if needed      . insulin glargine (LANTUS) 100 UNIT/ML injection Inject 42 Units into the skin at bedtime.      . Lancets (FREESTYLE) lancets PRN  100 each  5  . levofloxacin (LEVAQUIN) 500 MG tablet Take 1 tablet (500 mg total) by mouth daily. Dispense generic please  7 tablet  0  . lisinopril (PRINIVIL,ZESTRIL) 20 MG tablet Take 1 tablet (20 mg total) by mouth daily.  30 tablet  0  . NOVOFINE 32G X 6 MM MISC USE WITH INSULIN PEN FOR 4 INJECTIONS DAILY  120 each  8  . potassium chloride SA (K-DUR,KLOR-CON) 20 MEQ tablet Take 2 tablets (40 mEq total) by mouth once.  30 tablet  0  . hydrALAZINE (APRESOLINE) 10 MG tablet 1 tab po bid  60 tablet  1  . oseltamivir (TAMIFLU) 75 MG capsule Take 1 capsule (75 mg total) by mouth 2  (two) times daily.  10 capsule  0   No facility-administered medications prior to visit.  **Not taking hydralazine, tamiflu, lasix, or levaquin as listed above.  PE: Blood pressure 166/79, pulse 79, temperature 98.4 F (36.9 C), temperature source Temporal, resp. rate 16, weight 231 lb (104.781 kg), SpO2 99.00%. Gen: Alert, well appearing.  Patient is oriented to person, place, time, and situation. CN 2-12 intact bilat.  Pupils 1-2 mm, symmetric--react to light but very tiny amount. CV: RRR, no m/r/g.   LUNGS: CTA bilat, nonlabored resps, good aeration in all lung fields. EXT: 2-3+ pitting edema in both LE's.  IMPRESSION AND PLAN:  Fever in adult Very short-lived.  Resolved.  No recurrence. No explanation for this.  ARF (acute renal failure) Around the time of his brief febrile illness. Resolved on recheck a couple days later. Now that he's back on lisinopril and I'm getting him back on his lasix I will recheck BMET today.  HTN (hypertension), benign Not ideal control. Hypotension last night I think is likely a result of a vasovagal episode after his abd pain/vomiting.  He has shown signs of peripheral neuropathy and I suspect he has some autonomic neuropathy. Will keep him on lisinopril 20mg  qd and add back a 40mg  dose of lasix daily. If bp still up and no further episodes of hypotension at f/u in 73mo then I'll start adding his propranolol back for bp control and for portal HTN.  Cirrhosis With hx of portal HTN and ascites. Will restart lasix 40mg  qd today. Restart propranolol at next f/u if no hypotension/bradycardia.  An After Visit Summary was printed and given to the patient.    FOLLOW UP: 73mo

## 2013-03-08 NOTE — Assessment & Plan Note (Signed)
Around the time of his brief febrile illness. Resolved on recheck a couple days later. Now that he's back on lisinopril and I'm getting him back on his lasix I will recheck BMET today.

## 2013-03-08 NOTE — Assessment & Plan Note (Signed)
Very short-lived.  Resolved.  No recurrence. No explanation for this.

## 2013-03-15 ENCOUNTER — Other Ambulatory Visit: Payer: Self-pay | Admitting: Family Medicine

## 2013-03-15 NOTE — Telephone Encounter (Signed)
eScribe request for refill on TEST STRIPS  Last filled - 06/2012 Last seen on - 03/08/13 RX sent

## 2013-04-04 ENCOUNTER — Other Ambulatory Visit: Payer: Self-pay | Admitting: Family Medicine

## 2013-04-04 ENCOUNTER — Ambulatory Visit (HOSPITAL_BASED_OUTPATIENT_CLINIC_OR_DEPARTMENT_OTHER)
Admission: RE | Admit: 2013-04-04 | Discharge: 2013-04-04 | Disposition: A | Discharge status: Home / Self Care | Payer: Managed Care, Other (non HMO) | Source: Ambulatory Visit | Attending: Family Medicine | Admitting: Family Medicine

## 2013-04-04 ENCOUNTER — Encounter: Payer: Self-pay | Admitting: Family Medicine

## 2013-04-04 ENCOUNTER — Ambulatory Visit (INDEPENDENT_AMBULATORY_CARE_PROVIDER_SITE_OTHER): Payer: Managed Care, Other (non HMO) | Admitting: Family Medicine

## 2013-04-04 VITALS — BP 116/77 | HR 69 | Temp 98.8°F | Ht 70.0 in | Wt 231.2 lb

## 2013-04-04 DIAGNOSIS — M25561 Pain in right knee: Secondary | ICD-10-CM

## 2013-04-04 DIAGNOSIS — M25569 Pain in unspecified knee: Secondary | ICD-10-CM

## 2013-04-04 DIAGNOSIS — K7460 Unspecified cirrhosis of liver: Secondary | ICD-10-CM

## 2013-04-04 DIAGNOSIS — I10 Essential (primary) hypertension: Secondary | ICD-10-CM

## 2013-04-04 MED ORDER — PROPRANOLOL HCL 40 MG PO TABS: 40.0000 mg | ORAL_TABLET | Freq: Two times a day (BID) | ORAL | Status: DC

## 2013-04-04 MED ORDER — LISINOPRIL 20 MG PO TABS: 20.0000 mg | ORAL_TABLET | Freq: Every day | ORAL | Status: DC

## 2013-04-04 NOTE — Progress Notes (Signed)
OFFICE NOTE  04/07/2013  CC:  Chief Complaint  Patient presents with  . Follow-up    DM, Hypertension, Cirrhosis     HPI: Patient is a 52 y.o. Caucasian male who is here for 1 mo f/u HTN and cirrhosis with portal HTN/hx of ascites. Says "I feel great" except for recent right knee pain with going up stairs, doesn't hurt going down stairs.  Says it pops going up steps.  Denies acute injury.  Denies swelling or redness of knee.  He is a bit worried b/c he has hx of left knee meniscal tear that had to be surgically repaired.  Says he has been using his lasix only prn: a couple days per week avg---since I last saw him. Denies abd pain.  Appetite is good.  No melena or hematochezia.  Pertinent PMH:  Past Medical History  Diagnosis Date  . Heart murmur     ECHO 07/2011 showed mild AS and LVH.  TEE 09/2011 showed small PFO, bicuspid aortic valve but no stenosis or regurg.  . 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  F/u u/s for ongoing/worsening thrombocytopenia showed no more splenomegaly.  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  Gen surg and GI recommended flagyl and cipro x 2 wks..  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  . Cholelithiasis 09/2011  . Alcoholism   . Peripheral neuropathy 2012    Diabetic:  Autonomic (pelvic) and LE polyneuropathy--[facility] testing showed that it is likely from DM  . 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 Arthritis     back L3 - L5  . Hyperactive gag reflex   . History of pyelonephritis 09/2011  . Diabetes mellitus     IDDM  . Wears dentures     upper  . Osteomyelitis of toe of left foot 10/22/12    left 4th and 5th toes--now s/p amputation of these areas  . Diabetic  retinopathy associated with type 2 diabetes mellitus     Laser tx in both eyes   Past Surgical History  Procedure Laterality Date  . Knee arthroscopy  2001    Bilateral  . Amputation  10/24/2012    Procedure: AMPUTATION RAY;  Surgeon: Sherri Rad, MD;  [facility];  Service: Orthopedics;  Laterality: Left;  left 4th toe amputation through MTP joint, 5th Ray amputation   . Knee surgery  2001   Past family and social history reviewed and there are no changes since the patient's last office visit with me.  MEDS:  Outpatient Prescriptions Prior to Visit  Medication Sig Dispense Refill  . furosemide (LASIX) 40 MG tablet Take 1 tablet (40 mg total) by mouth daily.  90 tablet  1  . gabapentin (NEURONTIN) 300 MG capsule Take 300 mg by mouth 2 (two) times daily.      [name] glucagon 1 MG injection Inject 1 mg into the vein once as needed. For hypoglycemia      . glucose blood (FREESTYLE LITE) test strip Use as directed to check blood sugar. DX 250.82  100 each  4  . HYDROcodone-acetaminophen (NORCO) 5-325 MG per tablet Take 1 tablet by mouth every 6 (six) hours as needed for pain. One to  two tabs every 4-6 hours for pain  30 tablet  0  . insulin aspart (NOVOLOG) 100 UNIT/ML injection Inject 12 Units into the skin 3 (three) times daily before meals. Sliding scale in addition if needed      . insulin glargine (LANTUS) 100 UNIT/ML injection Inject 42 Units into the skin at bedtime.      . Lancets (FREESTYLE) lancets PRN  100 each  5  . NOVOFINE 32G X 6 MM MISC USE WITH INSULIN PEN FOR 4 INJECTIONS DAILY  120 each  8  . lisinopril (PRINIVIL,ZESTRIL) 20 MG tablet Take 1 tablet (20 mg total) by mouth daily.  30 tablet  0  . potassium chloride SA (K-DUR,KLOR-CON) 20 MEQ tablet Take 2 tablets (40 mEq total) by mouth once.  30 tablet  0   No facility-administered medications prior to visit.    PE: Blood pressure 116/77, pulse 69, temperature 98.8 F (37.1 C), temperature source  Oral, height 5\' 10"  (1.778 m), weight 231 lb 4 oz (104.894 kg), SpO2 96.00%. Gen: Alert, well appearing.  Patient is oriented to person, place, time, and situation. ENT: no icterus Oral: moist/pink CV: RRR LUNGS: CTA bilat ABD: soft, rotund, suspect fluid wave and a mild amount of ascites but not distended.  Normal BS.  No mass or bruit or HSM palpable. EXT: no clubbing or cyanosis.  2+ bilat LE pitting edema Right knee: +patellar grind, with mildly increased patellar laxity.  No knee effusion or erythema or warmth. No instability.  No joint line tenderness.  He has some focal peripatellar soft tissue tenderness (just next to inferolateral portion of patella, not the pes anserine bursa region and not the patellar tendon).  Knee flexion/extension intact, without crepitus.  With McMurray's testing he has an audible pop but I feel no part of his meniscus and he doesn't have joint line tenderness.  IMPRESSION AND PLAN:  Cirrhosis With portal htn, ascites. Trying to slowly get him back on his med regimen. Check CMET today.   Restart DAILY lasix 40mg  instead of prn lasix. Restart propranolol 40mg  bid.  HTN (hypertension), benign Stable.  Continue lisinopril 20 mg qd.  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  Doubt significant internal knee joint derangement. Will check x-ray of knee today. Advised tylenol 1000 mg bid for pain.     FOLLOW UP: 1 mo f/u cirrhosis/ascites, DM 2, knee pain

## 2013-04-05 LAB — BASIC METABOLIC PANEL
BUN: 50 mg/dL — ABNORMAL HIGH (ref 6–23)
CO2: 23 mEq/L (ref 19–32)
Calcium: 9 mg/dL (ref 8.4–10.5)
Creat: 1.24 mg/dL (ref 0.50–1.35)
Glucose, Bld: 60 mg/dL — ABNORMAL LOW (ref 70–99)

## 2013-04-07 ENCOUNTER — Encounter: Payer: Self-pay | Admitting: Family Medicine

## 2013-04-07 DIAGNOSIS — M25569 Pain in unspecified knee: Secondary | ICD-10-CM | POA: Insufficient documentation

## 2013-04-07 NOTE — Assessment & Plan Note (Addendum)
Stable. Continue lisinopril 20mg qd 

## 2013-04-07 NOTE — Assessment & Plan Note (Signed)
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  Doubt significant internal knee joint derangement. Will check x-ray of knee today. Advised tylenol 1000 mg bid for pain.

## 2013-04-07 NOTE — Assessment & Plan Note (Addendum)
With portal htn, ascites. Trying to slowly get him back on his med regimen. Check CMET today.   Restart DAILY lasix 40mg  instead of prn lasix. Restart propranolol 40mg  bid.

## 2013-04-09 ENCOUNTER — Other Ambulatory Visit: Payer: Self-pay | Admitting: Family Medicine

## 2013-04-09 DIAGNOSIS — K7031 Alcoholic cirrhosis of liver with ascites: Secondary | ICD-10-CM

## 2013-05-02 ENCOUNTER — Encounter: Payer: Self-pay | Admitting: Family Medicine

## 2013-05-02 ENCOUNTER — Ambulatory Visit (INDEPENDENT_AMBULATORY_CARE_PROVIDER_SITE_OTHER): Payer: Managed Care, Other (non HMO) | Admitting: Family Medicine

## 2013-05-02 VITALS — BP 169/78 | HR 65 | Temp 98.0°F | Resp 18 | Wt 239.5 lb

## 2013-05-02 DIAGNOSIS — IMO0002 Reserved for concepts with insufficient information to code with codable children: Secondary | ICD-10-CM

## 2013-05-02 DIAGNOSIS — K7030 Alcoholic cirrhosis of liver without ascites: Secondary | ICD-10-CM

## 2013-05-02 DIAGNOSIS — K7460 Unspecified cirrhosis of liver: Secondary | ICD-10-CM

## 2013-05-02 DIAGNOSIS — I10 Essential (primary) hypertension: Secondary | ICD-10-CM

## 2013-05-02 DIAGNOSIS — K7031 Alcoholic cirrhosis of liver with ascites: Secondary | ICD-10-CM

## 2013-05-02 DIAGNOSIS — E1165 Type 2 diabetes mellitus with hyperglycemia: Secondary | ICD-10-CM

## 2013-05-02 DIAGNOSIS — M25569 Pain in unspecified knee: Secondary | ICD-10-CM

## 2013-05-02 DIAGNOSIS — R609 Edema, unspecified: Secondary | ICD-10-CM

## 2013-05-02 DIAGNOSIS — E118 Type 2 diabetes mellitus with unspecified complications: Secondary | ICD-10-CM

## 2013-05-02 LAB — COMPREHENSIVE METABOLIC PANEL
Alkaline Phosphatase: 137 U/L — ABNORMAL HIGH (ref 39–117)
BUN: 26 mg/dL — ABNORMAL HIGH (ref 6–23)
CO2: 29 mEq/L (ref 19–32)
Creatinine, Ser: 0.9 mg/dL (ref 0.4–1.5)
GFR: 92.29 mL/min (ref >60.00)
Glucose, Bld: 120 mg/dL — ABNORMAL HIGH (ref 70–99)
Total Bilirubin: 1 mg/dL (ref 0.3–1.2)
Total Protein: 5.2 g/dL — ABNORMAL LOW (ref 6.0–8.3)

## 2013-05-02 LAB — PROTIME-INR
INR: 1 ratio (ref 0.8–1.0)
Prothrombin Time: 10.6 s (ref 10.2–12.4)

## 2013-05-02 MED ORDER — SPIRONOLACTONE 100 MG PO TABS: 100.0000 mg | ORAL_TABLET | Freq: Every day | ORAL | Status: DC

## 2013-05-02 NOTE — Progress Notes (Signed)
OFFICE NOTE  05/02/2013  CC:  Chief Complaint  Patient presents with  . Follow-up    39-month [DM; Cirrhosis; Knee pain]     HPI: Patient is a 52 y.o. Caucasian male who is here for 1 mo f/u cirrhosis with portal HTN, DM 2 with hx of poor control. Knee doing better, has some pain off and on, usually after standing long hours on hard surface.  We reviewed the knee x-ray done after last visit. Home bps 130s-160s, diastolics normal.  NO HAs, dizziness, or cp. Says glucoses still not ideally controlled, was 197 recently, but he is also still troubled by some hypoglycemia in middle of night when he works 3rd shift.  I wonder if his lantus is actually "peaking" in early morning hours coincident with peak of a dose of his mealtime insulin to create a stacking effect. He does admit he feels like he has some water wt lately: abdomen and LE's, some bloated feeling some lately.  He can't be very convincing today that he is watching his sodium intake very consistently.   Pertinent PMH:  Past Medical History  Diagnosis Date  . Heart murmur     ECHO 07/2011 showed mild AS and LVH.  TEE 09/2011 showed small PFO, bicuspid aortic valve but no stenosis or regurg.  . 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  F/u u/s for ongoing/worsening thrombocytopenia showed no more splenomegaly.  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  Gen surg and GI recommended flagyl and cipro x 2 wks..  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  . Cholelithiasis 09/2011  . Alcoholism   . Peripheral neuropathy 2012    Diabetic:  Autonomic (pelvic) and LE polyneuropathy--[facility] testing showed that it is likely from DM  . Diabetic foot ulcers 09/2012    Poor healing; normal ABIs and TBIs   . Arthritis     back L3 - L5  . Hyperactive gag reflex   . History of pyelonephritis 09/2011  . Diabetes mellitus     IDDM  . Wears dentures     upper  . Osteomyelitis of toe of left foot 10/22/12    left 4th and 5th toes--now s/p amputation of these areas  . Diabetic retinopathy associated with type 2 diabetes mellitus     Laser tx in both eyes    MEDS:  Outpatient Prescriptions Prior to Visit  Medication Sig Dispense Refill  . furosemide (LASIX) 40 MG tablet Take 1 tablet (40 mg total) by mouth daily.  90 tablet  1  . gabapentin (NEURONTIN) 300 MG capsule Take 300 mg by mouth 2 (two) times daily.      [name] glucagon 1 MG injection Inject 1 mg into the vein once as needed. For hypoglycemia      . glucose blood (FREESTYLE LITE) test strip Use as directed to check blood sugar. DX 250.82  100 each  4  . HYDROcodone-acetaminophen (NORCO) 5-325 MG per tablet Take 1 tablet by mouth every 6 (six) hours as needed for pain. One to two tabs every 4-6 hours for pain  30 tablet  0  . insulin aspart (NOVOLOG) 100 UNIT/ML injection Inject 12 Units into the skin 3 (three) times daily before meals. Sliding scale in addition if needed      .  insulin glargine (LANTUS) 100 UNIT/ML injection Inject 42 Units into the skin at bedtime.      . Lancets (FREESTYLE) lancets PRN  100 each  5  . lisinopril (PRINIVIL,ZESTRIL) 20 MG tablet Take 1 tablet (20 mg total) by mouth daily.  30 tablet  6  . NOVOFINE 32G X 6 MM MISC USE WITH INSULIN PEN FOR 4 INJECTIONS DAILY  120 each  8  . potassium chloride SA (K-DUR,KLOR-CON) 20 MEQ tablet Take 2 tablets (40 mEq total) by mouth once.  30 tablet  0  . propranolol (INDERAL) 40 MG tablet Take 1 tablet (40 mg total) by mouth 2 (two) times daily.  60 tablet  5   No facility-administered medications prior to visit.    PE: Blood pressure 169/78, pulse 65, temperature 98 F (36.7 C), temperature source Oral, resp. rate 18, weight 239 lb 8 oz (108.636 kg), SpO2 100.00%. Gen:  Alert, well appearing.  Patient is oriented to person, place, time, and situation. CV: RRR, no m/r/g.   LUNGS: CTA bilat, nonlabored resps, good aeration in all lung fields. ABD: mild distention and flank dullness.  Nontender.  No pitting abd wall edema.  No HSM or mass. EXT: 2+ LE edema bilat  IMPRESSION AND PLAN:  Knee joint pain Right. Stable. Patellofemoral arthritis for the most part. Discussed exercises, use of tylenol prn (1000 mg bid ok).   HTN (hypertension), benign Labile. Tentative up titration of meds is the plan, given his hx of periodic hypotension.  Diabetes mellitus type 2, uncontrolled, with complications Discussed changing the timing of his lantus to the morning whenever he works the 3rd shift. Continue with night time dosing of lantus when working 1st shift. Next A1c due after 05/24/13.  Cirrhosis With portal HTN.  He is having a bit more fluid retention of late. Emphasized importance of sodium restriction to 1.5-2 g/day once again with pt today. Also, will add spironolactone 100 mg qd Check lytes in 10d.   An After Visit Summary was printed and given to the patient.  FOLLOW UP: 10d lab visit.  O/v 6 wks.

## 2013-05-07 ENCOUNTER — Telehealth: Payer: Self-pay | Admitting: *Deleted

## 2013-05-07 MED ORDER — FUROSEMIDE 40 MG PO TABS: 40.0000 mg | ORAL_TABLET | Freq: Two times a day (BID) | ORAL | Status: DC

## 2013-05-07 NOTE — Telephone Encounter (Signed)
Patient informed, understood & agreed; new Rx w/dosing instructions to pharmacy/SLS

## 2013-05-07 NOTE — Telephone Encounter (Signed)
Message copied by Regis Bill on Tue May 07, 2013 10:38 AM ------      Message from: [name]      Created: Mon May 06, 2013 12:23 PM       Pls notify pt that labs were stable, kidney function continues to improve.      Due to his wt gain/increased swelling lately I want him to increase his lasix (furosemide) to 40mg  TWICE per day instead of just once a day.  Keep his scheduled lab visit towards the end of this month and I'll recheck BMET at that time (ordered).-thx       ------

## 2013-05-13 NOTE — Assessment & Plan Note (Signed)
With portal HTN.  He is having a bit more fluid retention of late. Emphasized importance of sodium restriction to 1.5-2 g/day once again with pt today. Also, will add spironolactone 100 mg qd Check lytes in 10d.

## 2013-05-13 NOTE — Assessment & Plan Note (Signed)
Discussed changing the timing of his lantus to the morning whenever he works the 3rd shift. Continue with night time dosing of lantus when working 1st shift. Next A1c due after 05/24/13.

## 2013-05-13 NOTE — Assessment & Plan Note (Signed)
Labile. Tentative up titration of meds is the plan, given his hx of periodic hypotension.

## 2013-05-13 NOTE — Assessment & Plan Note (Addendum)
Right. Stable. Patellofemoral arthritis for the most part. Discussed exercises, use of tylenol prn (1000 mg bid ok).

## 2013-05-16 ENCOUNTER — Other Ambulatory Visit (INDEPENDENT_AMBULATORY_CARE_PROVIDER_SITE_OTHER): Payer: Managed Care, Other (non HMO)

## 2013-05-16 DIAGNOSIS — R609 Edema, unspecified: Secondary | ICD-10-CM

## 2013-05-16 DIAGNOSIS — K7030 Alcoholic cirrhosis of liver without ascites: Secondary | ICD-10-CM

## 2013-05-16 DIAGNOSIS — K7031 Alcoholic cirrhosis of liver with ascites: Secondary | ICD-10-CM

## 2013-05-16 LAB — BASIC METABOLIC PANEL
BUN: 44 mg/dL — ABNORMAL HIGH (ref 6–23)
Chloride: 103 mEq/L (ref 96–112)
GFR: 53.73 mL/min — ABNORMAL LOW (ref >60.00)
Potassium: 5.5 mEq/L — ABNORMAL HIGH (ref 3.5–5.1)
Sodium: 136 mEq/L (ref 135–145)

## 2013-05-16 NOTE — Progress Notes (Unsigned)
Labs only

## 2013-05-17 ENCOUNTER — Other Ambulatory Visit: Payer: Self-pay | Admitting: Family Medicine

## 2013-05-17 DIAGNOSIS — R7989 Other specified abnormal findings of blood chemistry: Secondary | ICD-10-CM

## 2013-05-17 DIAGNOSIS — E875 Hyperkalemia: Secondary | ICD-10-CM

## 2013-05-23 ENCOUNTER — Other Ambulatory Visit (INDEPENDENT_AMBULATORY_CARE_PROVIDER_SITE_OTHER): Payer: Managed Care, Other (non HMO)

## 2013-05-23 ENCOUNTER — Telehealth: Payer: Self-pay | Admitting: Emergency Medicine

## 2013-05-23 ENCOUNTER — Other Ambulatory Visit: Payer: Managed Care, Other (non HMO)

## 2013-05-23 DIAGNOSIS — E875 Hyperkalemia: Secondary | ICD-10-CM

## 2013-05-23 DIAGNOSIS — R7989 Other specified abnormal findings of blood chemistry: Secondary | ICD-10-CM

## 2013-05-23 DIAGNOSIS — R799 Abnormal finding of blood chemistry, unspecified: Secondary | ICD-10-CM

## 2013-05-23 LAB — BASIC METABOLIC PANEL
CO2: 22 mEq/L (ref 19–32)
Calcium: 9.2 mg/dL (ref 8.4–10.5)
Potassium: 5.7 mEq/L — ABNORMAL HIGH (ref 3.5–5.1)
Sodium: 140 mEq/L (ref 135–145)

## 2013-05-23 NOTE — Telephone Encounter (Signed)
The patient is requesting 90 day prescriptions on his medications due to insurance and company coverage change coming up. He has an appointment on the 19th and states you can discuss the change with him at this type but he doesn't want to be without any meds during the coverage change.

## 2013-05-23 NOTE — Progress Notes (Signed)
Labs only

## 2013-05-24 ENCOUNTER — Other Ambulatory Visit: Payer: Self-pay | Admitting: Family Medicine

## 2013-05-24 ENCOUNTER — Telehealth: Payer: Self-pay | Admitting: *Deleted

## 2013-05-24 DIAGNOSIS — I10 Essential (primary) hypertension: Secondary | ICD-10-CM

## 2013-05-24 MED ORDER — LISINOPRIL 20 MG PO TABS: 10.0000 mg | ORAL_TABLET | Freq: Every day | ORAL | Status: DC

## 2013-05-24 MED ORDER — SODIUM POLYSTYRENE SULFONATE 15 GM/60ML PO SUSP: 15.0000 g | Freq: Once | ORAL | Status: DC

## 2013-05-24 NOTE — Telephone Encounter (Signed)
Noted/SLS

## 2013-05-24 NOTE — Telephone Encounter (Signed)
Patient informed, understood & agreed; change made to Lisinopril directions. Pt will be out of town until 06.16.14 & has OV on 06.19.14 [repeat lab], provider informed/SLS

## 2013-05-24 NOTE — Telephone Encounter (Signed)
Message copied by Regis Bill on Fri May 24, 2013  3:23 PM ------      Message from: [name]      Created: Fri May 24, 2013  1:10 AM       Pls notify pt that his potassium is still elevated and his kidney function is the same as 1 wk ago.        I recommend he cut is lisinopril 20mg  tab in half: take 1/2 tab daily.      Make sure he is not drinking a bunch of fruit juice, esp orange juice (high potassium content).      I'll send eRx for kayexolate: tell him he'll take one dose of this in order to try to bring his potassium down some.  Recheck BMET Monday 05/28/13.-thx ------

## 2013-06-19 ENCOUNTER — Ambulatory Visit: Payer: Managed Care, Other (non HMO) | Admitting: Family Medicine

## 2013-06-27 ENCOUNTER — Other Ambulatory Visit: Payer: Self-pay

## 2013-08-23 ENCOUNTER — Encounter: Payer: Self-pay | Admitting: Family Medicine

## 2013-08-23 ENCOUNTER — Ambulatory Visit (INDEPENDENT_AMBULATORY_CARE_PROVIDER_SITE_OTHER): Payer: 59 | Admitting: Family Medicine

## 2013-08-23 VITALS — BP 143/79 | HR 74 | Temp 98.9°F | Resp 18 | Ht 70.0 in | Wt 238.0 lb

## 2013-08-23 DIAGNOSIS — K8020 Calculus of gallbladder without cholecystitis without obstruction: Secondary | ICD-10-CM

## 2013-08-23 DIAGNOSIS — I10 Essential (primary) hypertension: Secondary | ICD-10-CM

## 2013-08-23 DIAGNOSIS — K7031 Alcoholic cirrhosis of liver with ascites: Secondary | ICD-10-CM

## 2013-08-23 DIAGNOSIS — IMO0001 Reserved for inherently not codable concepts without codable children: Secondary | ICD-10-CM

## 2013-08-23 DIAGNOSIS — K7030 Alcoholic cirrhosis of liver without ascites: Secondary | ICD-10-CM

## 2013-08-23 DIAGNOSIS — K859 Acute pancreatitis without necrosis or infection, unspecified: Secondary | ICD-10-CM

## 2013-08-23 LAB — PROTIME-INR: INR: 1 ratio (ref 0.8–1.0)

## 2013-08-23 LAB — COMPREHENSIVE METABOLIC PANEL
ALT: 21 U/L (ref 0–53)
AST: 20 U/L (ref 0–37)
Calcium: 7.8 mg/dL — ABNORMAL LOW (ref 8.4–10.5)
Chloride: 101 mEq/L (ref 96–112)
Creatinine, Ser: 1 mg/dL (ref 0.4–1.5)
Total Bilirubin: 1 mg/dL (ref 0.3–1.2)

## 2013-08-23 LAB — CBC WITH DIFFERENTIAL/PLATELET
Basophils Absolute: 0 10*3/uL (ref 0.0–0.1)
Eosinophils Absolute: 0.2 10*3/uL (ref 0.0–0.7)
Hemoglobin: 11.4 g/dL — ABNORMAL LOW (ref 13.0–17.0)
Lymphocytes Relative: 4.4 % — ABNORMAL LOW (ref 12.0–46.0)
MCHC: 33.4 g/dL (ref 30.0–36.0)
Neutro Abs: 8.3 10*3/uL — ABNORMAL HIGH (ref 1.4–7.7)
RDW: 13.7 % (ref 11.5–14.6)

## 2013-08-23 MED ORDER — SPIRONOLACTONE 100 MG PO TABS: 100.0000 mg | ORAL_TABLET | Freq: Every day | ORAL | Status: DC

## 2013-08-23 MED ORDER — LISINOPRIL 20 MG PO TABS: 10.0000 mg | ORAL_TABLET | Freq: Every day | ORAL | Status: DC

## 2013-08-23 MED ORDER — INSULIN ASPART 100 UNIT/ML ~~LOC~~ SOLN: 12.0000 [IU] | Freq: Three times a day (TID) | SUBCUTANEOUS | Status: DC

## 2013-08-23 MED ORDER — INSULIN PEN NEEDLE 32G X 6 MM MISC: Status: DC

## 2013-08-23 MED ORDER — GLUCOSE BLOOD VI STRP: ORAL_STRIP | Status: DC

## 2013-08-23 MED ORDER — FREESTYLE LANCETS MISC: Status: DC

## 2013-08-23 MED ORDER — PROPRANOLOL HCL 40 MG PO TABS: 40.0000 mg | ORAL_TABLET | Freq: Two times a day (BID) | ORAL | Status: DC

## 2013-08-23 MED ORDER — FUROSEMIDE 40 MG PO TABS: 40.0000 mg | ORAL_TABLET | Freq: Two times a day (BID) | ORAL | Status: DC

## 2013-08-23 MED ORDER — INSULIN GLARGINE 100 UNIT/ML ~~LOC~~ SOLN: 42.0000 [IU] | Freq: Every day | SUBCUTANEOUS | Status: DC

## 2013-08-23 NOTE — Progress Notes (Signed)
OFFICE NOTE  08/23/2013  CC:  Chief Complaint  Patient presents with  . Hospitalization Follow-up    [facility] hospital     HPI: Patient is a 52 y.o. Caucasian male who is here for hosp f/u for pancreatitis---presented with <1 day hx of n/v/dehyd, epigastric pain/gassy feeling.  Reviewed hospital notes, imaging, and labs today. He was kept NPO, got IVF, and imaging confirmed gallstones (not seen on u/s but seen on CT) and he was told he had pancreatitis (records show lipase >7000).  A1c was 10.1%.  He reports having one beer on his birthday the week PRIOR to onset of his pain.  He did not drink any alcohol leading up to the pain.  In fact, he had been hiking up [place] with family the day before and day of his pain onset.  Reports being well hydrated, was not overheated. Since d/c home yesterday he has eaten regular diet and has had no abd pain or nausea.  He still feels tired, says appetite is not really back yet.  He says he definitely will be ready to go back to work when he is next scheduled--08/25/13.  It is not clear that he is taking all meds as rx'd, he only knows them by color and also says some of them ran out---he was without medical insurance briefly recently.  Sounds like he has not been taking aldactone or propranolol.    ROS: no melena, no hematochezia, no fevers, no back pain, no chest pain, no SOB, no palpitations, no dizziness, no urinary difficulty, no cough.    Pertinent PMH:  Past Medical History  Diagnosis Date  . Heart murmur     ECHO 07/2011 showed mild AS and LVH.  TEE 09/2011 showed small PFO, bicuspid aortic valve but no stenosis or regurg.  . 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  F/u u/s for ongoing/worsening thrombocytopenia showed no more splenomegaly.  [name] Perforation of large intestine 09/2011    Presumably from perforated diverticulum:  fistula noted on CT, no abscess  (10/05/11).  Gen surg and GI recommended flagyl and cipro x 2 wks..  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  . Cholelithiasis 09/2011  . Alcoholism   . Peripheral neuropathy 2012    Diabetic:  Autonomic (pelvic) and LE polyneuropathy--[facility] testing showed that it is likely from DM  . 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 Arthritis     back L3 - L5  . Hyperactive gag reflex   . History of pyelonephritis 09/2011  . Diabetes mellitus     IDDM  . Wears dentures     upper  . Osteomyelitis of toe of left foot 10/22/12    left 4th and 5th toes--now s/p amputation of these areas  . Diabetic retinopathy associated with type 2 diabetes mellitus     Laser tx in both eyes   Past Surgical History  Procedure Laterality Date  . Knee arthroscopy  2001    Bilateral  . Amputation  10/24/2012    Procedure: AMPUTATION RAY;  Surgeon: Sherri Rad, MD;  Location: [facility];  Service: Orthopedics;  Laterality: Left;  left 4th toe amputation through MTP joint, 5th Ray amputation   . Knee surgery  2001   Past family and social history reviewed and  there are no changes since the patient's last office visit with me.  MEDS:  Outpatient Prescriptions Prior to Visit  Medication Sig Dispense Refill  . furosemide (LASIX) 40 MG tablet Take 1 tablet (40 mg total) by mouth 2 (two) times daily.  90 tablet  1  . gabapentin (NEURONTIN) 300 MG capsule Take 300 mg by mouth 2 (two) times daily.      [name] glucose blood (FREESTYLE LITE) test strip Use as directed to check blood sugar. DX 250.82  100 each  4  . insulin aspart (NOVOLOG) 100 UNIT/ML injection Inject 12 Units into the skin 3 (three) times daily before meals. Sliding scale in addition if needed      . insulin glargine (LANTUS) 100 UNIT/ML injection Inject 42 Units into the skin at bedtime.      [name] lisinopril (PRINIVIL,ZESTRIL) 20 MG  tablet Take 0.5 tablets (10 mg total) by mouth daily.  30 tablet  6  . glucagon 1 MG injection Inject 1 mg into the vein once as needed. For hypoglycemia      . HYDROcodone-acetaminophen (NORCO) 5-325 MG per tablet Take 1 tablet by mouth every 6 (six) hours as needed for pain. One to two tabs every 4-6 hours for pain  30 tablet  0  . Lancets (FREESTYLE) lancets PRN  100 each  5  . NOVOFINE 32G X 6 MM MISC USE WITH INSULIN PEN FOR 4 INJECTIONS DAILY  120 each  8  . potassium chloride SA (K-DUR,KLOR-CON) 20 MEQ tablet Take 2 tablets (40 mEq total) by mouth once.  30 tablet  0  . propranolol (INDERAL) 40 MG tablet Take 1 tablet (40 mg total) by mouth 2 (two) times daily.  60 tablet  5  . sodium polystyrene (KAYEXALATE) 15 GM/60ML suspension Take 60 mLs (15 g total) by mouth once.  60 mL  1  . spironolactone (ALDACTONE) 100 MG tablet Take 1 tablet (100 mg total) by mouth daily.  30 tablet  3   No facility-administered medications prior to visit.    PE: Blood pressure 143/79, pulse 74, temperature 98.9 F (37.2 C), temperature source Temporal, resp. rate 18, height 5\' 10"  (1.778 m), weight 238 lb (107.956 kg), SpO2 98.00%. Gen: Alert, well appearing.  Patient is oriented to person, place, time, and situation. ENT: no icterus. Oral mucosa pink/moist CV: RRR, 2/6 systolic murmur heard at LUSB and RUSB.  S1 and S2 normal.  No diastolic murmur, no rub, no gallop.   Chest is clear, no wheezing or rales. Normal symmetric air entry throughout both lung fields. No chest wall deformities or tenderness. ABD: rotund, appears to have mild ascites but he has no marked distention, +flank dullness but no fluid wave or abd wall pitting edema.  BS normal.  No tenderness to palpation.  No bruit.  No HSM or mass. EXT: 1+ bilat pitting edema in lower legs   IMPRESSION AND PLAN:  1) Acute pancreatitis, resolved.  Likely secondary to brief/transient choledocholithiasis from tiny gallstones (noted on CT scan in  hosp).  No acute cholecystitis.  No alcohol ingestion to explain this. I will refer him to gen surgeon for consideration of cholecystectomy since it is very possible this could be a recurring issue for him.  Continue advancing diet and activity, may return to work as scheduled 08/25/13.  2) Cirrhosis with portal HTN and ascites: needs to resume aldactone and propranolol at previous doses. Continue current lasix dosing.  He has local GI MD, Dr. Kara Pacer get him  back in for f/u with her in near future for consideration of upper endoscopy for screening for esoph varices.  3) DM 2, poor control.  Did not get into this much today, but he claims compliance with insulins.  He is noncompliant with monitoring.  Recent A1c 10.1 %.  Will review this more at next f/u in 2 wks if possible.  4) HTN, stable.  Continue current meds.  Check CBC w/diff, CMET, lipase, and PT/INR   FOLLOW UP: 2 wks

## 2013-08-24 ENCOUNTER — Encounter: Payer: Self-pay | Admitting: Family Medicine

## 2013-08-29 ENCOUNTER — Ambulatory Visit (INDEPENDENT_AMBULATORY_CARE_PROVIDER_SITE_OTHER): Payer: 59 | Admitting: General Surgery

## 2013-08-29 ENCOUNTER — Encounter (INDEPENDENT_AMBULATORY_CARE_PROVIDER_SITE_OTHER): Payer: Self-pay | Admitting: General Surgery

## 2013-08-29 ENCOUNTER — Telehealth (INDEPENDENT_AMBULATORY_CARE_PROVIDER_SITE_OTHER): Payer: Self-pay | Admitting: General Surgery

## 2013-08-29 ENCOUNTER — Other Ambulatory Visit (INDEPENDENT_AMBULATORY_CARE_PROVIDER_SITE_OTHER): Payer: Self-pay | Admitting: General Surgery

## 2013-08-29 DIAGNOSIS — Z8719 Personal history of other diseases of the digestive system: Secondary | ICD-10-CM

## 2013-08-29 DIAGNOSIS — K8020 Calculus of gallbladder without cholecystitis without obstruction: Secondary | ICD-10-CM

## 2013-08-29 DIAGNOSIS — K859 Acute pancreatitis without necrosis or infection, unspecified: Secondary | ICD-10-CM

## 2013-08-29 NOTE — Progress Notes (Signed)
Subjective:     Patient ID: [name], male   DOB: 1961/01/04, 52 y.o.   MRN: [id number]  HPI The patient is a 52 year old male who is referred by Dr. Lynford Humphrey for an evaluation of gallstones. The patient states that several weeks ago he was returning from [place] and noticed a significant abdominal pain. Patient stopped at a hospital in [place] and underwent evaluation what seems like gallstone pancreatitis. Patient underwent a CT scan ultrasound there is questionable whether or not there were gallstones seen. The patient's lipase was elevated at that time per reviewing the notes by Dr. Lynford Humphrey.    The patient states that never had symptoms like this in the past. He is very unclear and his past medical history is unsure whether not she has any cirrhosis but does state he retains fluid.    Review of Systems  Constitutional: Negative.   HENT: Negative.   Eyes: Negative.   Respiratory: Negative.   Cardiovascular: Negative.   Gastrointestinal: Positive for abdominal distention. Negative for abdominal pain.  Musculoskeletal: Negative.   Neurological: Negative.   All other systems reviewed and are negative.       Objective:   Physical Exam  Constitutional: He is oriented to person, place, and time. He appears well-developed and well-nourished.  HENT:  Head: Normocephalic and atraumatic.  Eyes: Conjunctivae and EOM are normal. Pupils are equal, round, and reactive to light.  Neck: Normal range of motion. Neck supple.  Cardiovascular: Normal rate, regular rhythm and normal heart sounds.   Pulmonary/Chest: Effort normal and breath sounds normal.  Abdominal: Soft. Bowel sounds are normal. He exhibits no mass. There is no tenderness. There is no rebound and no guarding.  Musculoskeletal: Normal range of motion.  Neurological: He is alert and oriented to person, place, and time.       Assessment:     52 year old male with questionable episode of gallstone pancreatitis.     Plan:      1. We'll proceed with a CT scan of the abdomen and pelvis with IV contrast to evaluate for possible gallstones as well as to evaluate his liver secondary to history of cirrhosis. 2.  We will have the patient follow back up after his CT scan to review the results in the next step is care.

## 2013-08-29 NOTE — Telephone Encounter (Signed)
LMOM asking pt to return my call.  This is so that I may inform him that we have him scheduled for a Ct scan with [facility] located at [address] on 09/04/13 at 11:45.  Need to inform him that he will need to pick up contrast from that location at least a day in advance and that he would drink the first bottle of contrast at 10:00 and the second at 11:00.  He must also be informed that he cannot have any solid foods 4 hours before the test.

## 2013-09-04 ENCOUNTER — Inpatient Hospital Stay: Admission: RE | Admit: 2013-09-04 | Payer: Self-pay | Source: Ambulatory Visit

## 2013-09-06 ENCOUNTER — Encounter: Payer: Self-pay | Admitting: Family Medicine

## 2013-09-06 ENCOUNTER — Ambulatory Visit (INDEPENDENT_AMBULATORY_CARE_PROVIDER_SITE_OTHER): Payer: 59 | Admitting: Family Medicine

## 2013-09-06 VITALS — BP 146/77 | HR 59 | Temp 97.6°F | Resp 16 | Ht 70.0 in | Wt 220.0 lb

## 2013-09-06 DIAGNOSIS — K7031 Alcoholic cirrhosis of liver with ascites: Secondary | ICD-10-CM

## 2013-09-06 DIAGNOSIS — K7030 Alcoholic cirrhosis of liver without ascites: Secondary | ICD-10-CM

## 2013-09-06 DIAGNOSIS — E119 Type 2 diabetes mellitus without complications: Secondary | ICD-10-CM

## 2013-09-06 LAB — BASIC METABOLIC PANEL
BUN: 23 mg/dL (ref 6–23)
CO2: 30 mEq/L (ref 19–32)
Calcium: 8.6 mg/dL (ref 8.4–10.5)
Creatinine, Ser: 1 mg/dL (ref 0.4–1.5)
Glucose, Bld: 44 mg/dL — CL (ref 70–99)

## 2013-09-06 MED ORDER — FUROSEMIDE 40 MG PO TABS: ORAL_TABLET | ORAL | Status: DC

## 2013-09-06 NOTE — Progress Notes (Signed)
OFFICE NOTE  09/06/2013  CC:  Chief Complaint  Patient presents with  . Follow-up     HPI: Patient is a 52 y.o. Caucasian male who is here for 2 week f/u.  Had recent pancreatitis, I sent him to surgeon b/c it was unclear if gallstones had been the cause--alcohol was not involved.  He saw the surgeon and was set to get CT abd but pt could not afford the 200$ required so he will have to wait until he gets paid and reschedule this.  I also got him restarted on his propranolol and aldactone, continued lasix.  He cannot be sure that he's taking all the meds I rx'd so I asked him to bring in his med bottles next visit. DM has not been well controlled lately, recent A1c about 10%.  Feels great.  Last glucose checked: last night before supper--147. Last fasting gluc was 78    Pertinent PMH:  Past Medical History  Diagnosis Date  . Heart murmur     ECHO 07/2011 showed mild AS and LVH.  TEE 09/2011 showed small PFO, bicuspid aortic valve but no stenosis or regurg.  . 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  F/u u/s for ongoing/worsening thrombocytopenia showed no more splenomegaly.  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  Gen surg and GI recommended flagyl and cipro x 2 wks..  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  . Cholelithiasis 09/2011    u/s--no cholecystitis  . Alcoholism   . Peripheral neuropathy 2012    Diabetic:  Autonomic (pelvic) and LE polyneuropathy--[facility] testing showed that it is likely from DM  . 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 Arthritis     back L3 - L5  . Hyperactive gag reflex   . History of pyelonephritis 09/2011  . Diabetes mellitus     IDDM  . Wears dentures     upper  .  Osteomyelitis of toe of left foot 10/22/12    left 4th and 5th toes--now s/p amputation of these areas  . Diabetic retinopathy associated with type 2 diabetes mellitus     Laser tx in both eyes    MEDS:  Outpatient Prescriptions Prior to Visit  Medication Sig Dispense Refill  . furosemide (LASIX) 40 MG tablet Take 1 tablet (40 mg total) by mouth 2 (two) times daily.  60 tablet  6  . gabapentin (NEURONTIN) 300 MG capsule Take 300 mg by mouth 2 (two) times daily.      [name] glucose blood (FREESTYLE LITE) test strip Use as directed to check blood sugar. DX 250.82  100 each  6  . insulin aspart (NOVOLOG) 100 UNIT/ML injection Inject 12 Units into the skin 3 (three) times daily before meals. Sliding scale in addition if needed  1 vial  6  . insulin glargine (LANTUS) 100 UNIT/ML injection Inject 0.42 mLs (42 Units total) into the skin at bedtime.  10 mL  6  . lisinopril (PRINIVIL,ZESTRIL) 20 MG tablet Take 0.5 tablets (10 mg total) by mouth daily.  30 tablet  6  . spironolactone (ALDACTONE) 100 MG tablet Take 1 tablet (100 mg total) by mouth daily.  30 tablet  6  . glucagon 1 MG injection  Inject 1 mg into the vein once as needed. For hypoglycemia      . Insulin Pen Needle (NOVOFINE) 32G X 6 MM MISC USE WITH INSULIN PEN FOR 4 INJECTIONS DAILY  120 each  6  . Lancets (FREESTYLE) lancets PRN  100 each  6  . potassium chloride SA (K-DUR,KLOR-CON) 20 MEQ tablet Take 2 tablets (40 mEq total) by mouth once.  30 tablet  0  . propranolol (INDERAL) 40 MG tablet Take 1 tablet (40 mg total) by mouth 2 (two) times daily.  60 tablet  6   No facility-administered medications prior to visit.    PE: Blood pressure 146/77, pulse 59, temperature 97.6 F (36.4 C), temperature source Temporal, resp. rate 16, height 5\' 10"  (1.778 m), weight 220 lb (99.791 kg), SpO2 100.00%. Gen: Alert, well appearing.  Patient is oriented to person, place, time, and situation. Mouth: oral mucosa moist and pink.  Eyes anicteric. Skin:  no jaundice or pallor CV: RRR, no m/r/g.   LUNGS: CTA bilat, nonlabored resps, good aeration in all lung fields. ABD: soft, rotund but not distended, nontender.  No HSM, mass, or bruit. EXT: 2+ pitting pretibial/ankle edema on left, trace pitting pretibial/ankle edema on right   IMPRESSION AND PLAN:  1) cirrhosis with portal HTN: I told him I wanted him to make sure he is taking all meds as rx'd--it is unclear (as usual) from his report today whether he is or not.  He has lost 18 lbs in 2 wks.  Will decrease lasix back to 40mg  qd.  He is NOT currently taking any potassium supplement.  Recheck BMET today.  Needs EGD by GI to screen for esoph varices in near future.  2) Pancreatitis recently; question of transient biliary obstruction as cause.  Gen surgery is in midst of further eval: pt will get the CT abd they have ordered when he gets paid.    3) HTN: well controlled currently.  4) DM 2, poor control.  Erratic blood sugars historically.  In fact, today in office he felt slightly hot so he asked for his glucose to be checked.  He was fasting and last took insulin at bedtime last night (lantus).  Glucose here was 80.  He was alert, thinking well, denied weakness, vision problems, shakiness, or nausea/lightheadedness.  I gave him some trail-mix and he was going to go straight home to eat.  An After Visit Summary was printed and given to the patient.  FOLLOW UP: 1 mo

## 2013-09-09 ENCOUNTER — Telehealth: Payer: Self-pay | Admitting: Family Medicine

## 2013-09-09 NOTE — Telephone Encounter (Signed)
Please fax FMLA paperwork to occupational health nurse at [employer]. Patient had it with him at 9/19 OV. Today is the due date. Thanks.

## 2013-09-09 NOTE — Telephone Encounter (Signed)
Please advise 

## 2013-09-10 ENCOUNTER — Telehealth (INDEPENDENT_AMBULATORY_CARE_PROVIDER_SITE_OTHER): Payer: Self-pay | Admitting: General Surgery

## 2013-09-10 NOTE — Telephone Encounter (Signed)
LMOM 12:09 asking patient to return my call..he has a follow up appt schd for 9/25 for GB/after scan but I cannot find anywhere that he has had a scan performed...he was set up for CT abd/pel with contrast 09/04/13 but I do not see where he went and had it done..please ask patient if he in fact did or did not have this done cause we may need to reschd his appt

## 2013-09-12 ENCOUNTER — Encounter (INDEPENDENT_AMBULATORY_CARE_PROVIDER_SITE_OTHER): Payer: 59 | Admitting: General Surgery

## 2013-09-12 NOTE — Telephone Encounter (Signed)
This was faxed 09/11/13.

## 2013-09-24 ENCOUNTER — Encounter: Payer: Self-pay | Admitting: Family Medicine

## 2013-10-01 ENCOUNTER — Encounter: Payer: Self-pay | Admitting: Family Medicine

## 2013-10-03 ENCOUNTER — Telehealth (INDEPENDENT_AMBULATORY_CARE_PROVIDER_SITE_OTHER): Payer: Self-pay | Admitting: General Surgery

## 2013-10-03 NOTE — Telephone Encounter (Signed)
LMOM asking pt to return my call..wondering why he did not go through with his CT that we had schd for 9/17 and what his plans were..he also did not show up for his 9/25 appt with AR but since he didn't have the CT there really was no need

## 2013-10-07 ENCOUNTER — Encounter: Payer: Self-pay | Admitting: Family Medicine

## 2013-10-07 ENCOUNTER — Ambulatory Visit (INDEPENDENT_AMBULATORY_CARE_PROVIDER_SITE_OTHER): Payer: 59 | Admitting: Family Medicine

## 2013-10-07 VITALS — BP 152/81 | HR 80 | Temp 98.9°F | Resp 18 | Ht 70.0 in | Wt 219.0 lb

## 2013-10-07 DIAGNOSIS — I10 Essential (primary) hypertension: Secondary | ICD-10-CM

## 2013-10-07 DIAGNOSIS — E1165 Type 2 diabetes mellitus with hyperglycemia: Secondary | ICD-10-CM

## 2013-10-07 DIAGNOSIS — Z8719 Personal history of other diseases of the digestive system: Secondary | ICD-10-CM | POA: Insufficient documentation

## 2013-10-07 DIAGNOSIS — IMO0002 Reserved for concepts with insufficient information to code with codable children: Secondary | ICD-10-CM

## 2013-10-07 LAB — BASIC METABOLIC PANEL
BUN: 30 mg/dL — ABNORMAL HIGH (ref 6–23)
Calcium: 8.6 mg/dL (ref 8.4–10.5)
GFR: 55.83 mL/min — ABNORMAL LOW (ref >60.00)
Glucose, Bld: 324 mg/dL — ABNORMAL HIGH (ref 70–99)
Potassium: 5 mEq/L (ref 3.5–5.1)

## 2013-10-07 NOTE — Assessment & Plan Note (Signed)
Question of whether due to gallstones or not. Surgeon w/u stalled due to patient trying to get money for deductible for CT scan.

## 2013-10-07 NOTE — Assessment & Plan Note (Signed)
Control not ideal based on his one bp here today. He's unclear on whether he ran out of this med or not. No changes made today.  Will check with pharmacy to see when latest refill was and he is to return or call with a list of exactly how he takes his meds.

## 2013-10-07 NOTE — Assessment & Plan Note (Signed)
Poor control, difficulty with compliance and diet, mainly due to his shift at his job changing every two weeks. No changes made in meds today. Urine microalb/cr today.

## 2013-10-07 NOTE — Progress Notes (Signed)
OFFICE NOTE  10/07/2013  CC:  Chief Complaint  Patient presents with  . Follow-up     HPI: Patient is a 52 y.o. Caucasian male who is here for 1 mo f/u DM 2, cirrhosis with portal HTN, and recent pancreatitis that we were having him see a surgeon about--unclear whether etiology was transient biliary obstruction or not.  He is no longer a drinker. He still hasn't gotten the CT the surgeon wanted--working on getting the money.  HbA1c approx 08/19/13 at an outside hospital was 10.1%.  We discussed once again at that time the necessity of his close adherence to a consistent diabetic diet and insulin adjustment based on home glucose monitoring.  He forgot his meds and glucose log today. Has had a few lows and highest was 247.  Estimated avg over last 1 mo 145-170.  Current insulin dosing is:  Works shift work: nights x 2wks, afternoons x 2 wks, days x 2 wks.   Has most trouble when he works nights--doesn't eat nearly as consistently 4and eats different foods. Reports compliance with all other meds.  Due for urine microalb/cr and flu vaccine today.  Pertinent PMH:  Past Medical History  Diagnosis Date  . Heart murmur     ECHO 07/2011 showed mild AS and LVH.  TEE 09/2011 showed small PFO, bicuspid aortic valve but no stenosis or regurg.  . 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  F/u u/s for ongoing/worsening thrombocytopenia showed no more splenomegaly.  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  Gen surg and GI recommended flagyl and cipro x 2 wks..  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  . Cholelithiasis 09/2011; 08/2013    u/s--no cholecystitis  . Alcoholism   . Peripheral neuropathy 2012    Diabetic:  Autonomic (pelvic) and LE  polyneuropathy--[facility] testing showed that it is likely from DM  . 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 Arthritis     back L3 - L5  . Hyperactive gag reflex   . History of pyelonephritis 09/2011  . Type II or unspecified type diabetes mellitus with neurological manifestations, not stated as uncontrolled(250.60)     IDDM: neuro and ophth complications  . Wears dentures     upper  . Osteomyelitis of toe of left foot 10/22/12    left 4th and 5th toes--now s/p amputation of these areas  . Diabetic retinopathy associated with type 2 diabetes mellitus     Laser tx in both eyes    MEDS:  Outpatient Prescriptions Prior to Visit  Medication Sig Dispense Refill  . furosemide (LASIX) 40 MG tablet 1 tab po qd  60 tablet  6  . gabapentin (NEURONTIN) 300 MG capsule Take 300 mg by mouth 2 (two) times daily.      . insulin aspart (NOVOLOG) 100 UNIT/ML injection Inject 12 Units into the skin 3 (three) times daily before meals. Sliding scale in addition if needed  1 vial  6  . insulin glargine (LANTUS) 100 UNIT/ML injection Inject 0.42 mLs (42 Units total) into the skin at bedtime.  10 mL  6  . spironolactone (ALDACTONE) 100 MG tablet Take 1 tablet (100 mg total) by mouth daily.  30 tablet  6  .  glucagon 1 MG injection Inject 1 mg into the vein once as needed. For hypoglycemia      . glucose blood (FREESTYLE LITE) test strip Use as directed to check blood sugar. DX 250.82  100 each  6  . Insulin Pen Needle (NOVOFINE) 32G X 6 MM MISC USE WITH INSULIN PEN FOR 4 INJECTIONS DAILY  120 each  6  . Lancets (FREESTYLE) lancets PRN  100 each  6  . lisinopril (PRINIVIL,ZESTRIL) 20 MG tablet Take 0.5 tablets (10 mg total) by mouth daily.  30 tablet  6  . propranolol (INDERAL) 40 MG tablet Take 1 tablet (40 mg total) by mouth 2 (two) times daily.  60 tablet  6   No facility-administered medications prior to visit.    PE: Blood pressure 152/81, pulse 80, temperature 98.9 F (37.2  C), temperature source Temporal, resp. rate 18, height 5\' 10"  (1.778 m), weight 219 lb (99.338 kg), SpO2 98.00%. Gen: Alert, well appearing.  Patient is oriented to person, place, time, and situation. AFFECT: pleasant, lucid thought and speech. No further exam today.   IMPRESSION AND PLAN:  Diabetes mellitus type 2, uncontrolled, with complications Poor control, difficulty with compliance and diet, mainly due to his shift at his job changing every two weeks. No changes made in meds today. Urine microalb/cr today.  HTN (hypertension), benign Control not ideal based on his one bp here today. He's unclear on whether he ran out of this med or not. No changes made today.  Will check with pharmacy to see when latest refill was and he is to return or call with a list of exactly how he takes his meds.  History of pancreatitis Question of whether due to gallstones or not. Surgeon w/u stalled due to patient trying to get money for deductible for CT scan.   An After Visit Summary was printed and given to the patient.  Spent 30 min with pt today, with >50% of this time spent in counseling and care coordination regarding the above problems.  FOLLOW UP: 2 mo

## 2013-10-08 ENCOUNTER — Other Ambulatory Visit: Payer: Self-pay | Admitting: Family Medicine

## 2013-10-23 ENCOUNTER — Other Ambulatory Visit: Payer: Self-pay | Admitting: Family Medicine

## 2013-10-23 ENCOUNTER — Other Ambulatory Visit: Payer: 59

## 2013-10-23 DIAGNOSIS — I10 Essential (primary) hypertension: Secondary | ICD-10-CM

## 2013-12-06 ENCOUNTER — Ambulatory Visit: Payer: 59 | Admitting: Family Medicine

## 2013-12-19 DIAGNOSIS — Z8619 Personal history of other infectious and parasitic diseases: Secondary | ICD-10-CM

## 2013-12-19 HISTORY — DX: Personal history of other infectious and parasitic diseases: Z86.19

## 2013-12-25 ENCOUNTER — Ambulatory Visit (INDEPENDENT_AMBULATORY_CARE_PROVIDER_SITE_OTHER): Payer: 59 | Admitting: Family Medicine

## 2013-12-25 ENCOUNTER — Encounter: Payer: Self-pay | Admitting: Family Medicine

## 2013-12-25 VITALS — BP 164/91 | HR 64 | Temp 97.6°F | Resp 18 | Ht 70.0 in | Wt 229.0 lb

## 2013-12-25 DIAGNOSIS — IMO0002 Reserved for concepts with insufficient information to code with codable children: Secondary | ICD-10-CM

## 2013-12-25 DIAGNOSIS — N182 Chronic kidney disease, stage 2 (mild): Secondary | ICD-10-CM

## 2013-12-25 DIAGNOSIS — K7460 Unspecified cirrhosis of liver: Secondary | ICD-10-CM

## 2013-12-25 DIAGNOSIS — E118 Type 2 diabetes mellitus with unspecified complications: Principal | ICD-10-CM

## 2013-12-25 DIAGNOSIS — E1165 Type 2 diabetes mellitus with hyperglycemia: Secondary | ICD-10-CM

## 2013-12-25 DIAGNOSIS — I10 Essential (primary) hypertension: Secondary | ICD-10-CM

## 2013-12-25 LAB — COMPREHENSIVE METABOLIC PANEL
ALBUMIN: 3.6 g/dL (ref 3.5–5.2)
ALK PHOS: 131 U/L — AB (ref 39–117)
ALT: 29 U/L (ref 0–53)
AST: 35 U/L (ref 0–37)
BUN: 36 mg/dL — ABNORMAL HIGH (ref 6–23)
CALCIUM: 9.5 mg/dL (ref 8.4–10.5)
CHLORIDE: 103 meq/L (ref 96–112)
CO2: 28 mEq/L (ref 19–32)
Creatinine, Ser: 1.3 mg/dL (ref 0.4–1.5)
GFR: 63.45 mL/min (ref >60.00)
Glucose, Bld: 89 mg/dL (ref 70–99)
Potassium: 4.9 mEq/L (ref 3.5–5.1)
SODIUM: 138 meq/L (ref 135–145)
TOTAL PROTEIN: 6 g/dL (ref 6.0–8.3)
Total Bilirubin: 0.9 mg/dL (ref 0.3–1.2)

## 2013-12-25 LAB — HEMOGLOBIN A1C: Hgb A1c MFr Bld: 9.3 % — ABNORMAL HIGH (ref 4.6–6.5)

## 2013-12-25 MED ORDER — SILDENAFIL CITRATE 50 MG PO TABS: ORAL_TABLET | ORAL | Status: DC

## 2013-12-25 MED ORDER — LISINOPRIL 20 MG PO TABS: 20.0000 mg | ORAL_TABLET | Freq: Every day | ORAL | Status: DC

## 2013-12-25 NOTE — Progress Notes (Signed)
OFFICE NOTE  12/25/2013  CC:  Chief Complaint  Patient presents with  . Follow-up  . Knee Injury  . Shoulder Injury     HPI: Patient is a 53 y.o. Caucasian male who is here for 2 mo f/u HTN, cirrhosis, DM 2. Fell on outstretched arms on christmas eve when he accidentally tripped and caught himself---pain in left shoulder when lifting it above his head and when reaching across his body.  Lifting bags at work not giving him problems.  Also an abrasion to left knee and some acute pain/swelling that has resolved.  Still some variation in glucose control depending on what shift he works. Avg 130 per pt. No significant edema.  BP checks at home have been normal. No abd pain, no appetite loss, no jaundice, no fevers.  Pertinent PMH:  Past Medical History  Diagnosis Date  . Heart murmur     ECHO 07/2011 showed mild AS and LVH.  TEE 09/2011 showed small PFO, bicuspid aortic valve but no stenosis or regurg.  . Cirrhosis, alcoholic 67/1245    with mild splenomegaly and ascites 2012/12013; has not had EGD yet (as of 08/23/13).  F/u u/s for ongoing/worsening thrombocytopenia showed no more splenomegaly.  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  Gen surg and GI recommended flagyl and cipro x 2 wks..  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  . Cholelithiasis 09/2011; 08/2013    u/s--no cholecystitis  . Alcoholism   . Peripheral neuropathy 2012    Diabetic:  Autonomic (pelvic) and LE polyneuropathy--[facility] testing showed that it is likely from DM  . 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 Arthritis     back L3 - L5  . Hyperactive gag reflex   . History of pyelonephritis 09/2011  . Type II or unspecified type diabetes mellitus with neurological manifestations, not stated as uncontrolled(250.60)      IDDM: neuro, renal, and ophth complications  . Wears dentures     upper  . Osteomyelitis of toe of left foot 10/22/12    left 4th and 5th toes--now s/p amputation of these areas  . Diabetic retinopathy associated with type 2 diabetes mellitus     Laser tx in both eyes  . Diabetic nephropathy 09/2013    Proteinuria and CrCl 55-65  . Chronic renal insufficiency, stage III (moderate)    pmhr2  MEDS:  Outpatient Prescriptions Prior to Visit  Medication Sig Dispense Refill  . furosemide (LASIX) 40 MG tablet 1 tab po qd  60 tablet  6  . gabapentin (NEURONTIN) 300 MG capsule Take 300 mg by mouth 2 (two) times daily.      . insulin aspart (NOVOLOG) 100 UNIT/ML injection Inject 12 Units into the skin 3 (three) times daily before meals. Sliding scale in addition if needed  1 vial  6  . insulin glargine (LANTUS) 100 UNIT/ML injection Inject 0.42 mLs (42 Units total) into the skin at bedtime.  10 mL  6  . lisinopril (PRINIVIL,ZESTRIL) 20 MG tablet Take 0.5 tablets (10 mg total) by mouth daily.  30 tablet  6  . propranolol (INDERAL) 40 MG tablet Take 1 tablet (40 mg total) by mouth 2 (two) times daily.  60 tablet  6  . spironolactone (ALDACTONE) 100 MG tablet Take 1 tablet (100 mg total)  by mouth daily.  30 tablet  6  . glucagon 1 MG injection Inject 1 mg into the vein once as needed. For hypoglycemia      . glucose blood (FREESTYLE LITE) test strip Use as directed to check blood sugar. DX 250.82  100 each  6  . Insulin Pen Needle (NOVOFINE) 32G X 6 MM MISC USE WITH INSULIN PEN FOR 4 INJECTIONS DAILY  120 each  6  . Lancets (FREESTYLE) lancets PRN  100 each  6   No facility-administered medications prior to visit.  **taking 1/2 of 115m aldactone qd, and taking one whole 24mlisinopril qd, taking 1 tab twice daily of lasix  PE: Blood pressure 164/91, pulse 64, temperature 97.6 F (36.4 C), temperature source Temporal, resp. rate 18, height 5' 10"  (1.778 m), weight 229 lb (103.874 kg), SpO2  100.00%. Gen: Alert, well appearing.  Patient is oriented to person, place, time, and situation. AFFECT: pleasant, lucid thought and speech. CV: RRR, no m/r/g.   LUNGS: CTA bilat, nonlabored resps, good aeration in all lung fields. ABD: soft, rotund but not distended.  No HSM, mass, or tenderness to palpation. Left knee abrasion, dry and healing fine. Legs: 1+ pitting on left, trace on right  IMPRESSION AND PLAN:  1) HTN: home measurements good.  No changes in meds at this time.  2) DM 2, insulin-requiring.  Control is difficult to assess based on his home monitoring. HbA1c today.  3) CRI and hx if hyperkalemia: lytes/cr check today.  4) Cirrhosis with portal HTN: The current medical regimen is effective;  continue present plan and medications. Check hepatic panel today.  5) Erectile dysfunction: at the end of today's visit he said he met a girl and thinks it may get intimate soon. He asks for viagra.  I gave him some samples of 5065mnd 100 mg viagra tabs today.  Therapeutic expectations and side effect profile of medication discussed today.  Patient's questions answered.  FOLLOW UP: 3-4 mo

## 2013-12-25 NOTE — Progress Notes (Signed)
Pre visit review using our clinic review tool, if applicable. No additional management support is needed unless otherwise documented below in the visit note. 

## 2013-12-26 ENCOUNTER — Telehealth: Payer: Self-pay | Admitting: Family Medicine

## 2013-12-26 ENCOUNTER — Telehealth: Payer: Self-pay

## 2013-12-26 NOTE — Telephone Encounter (Signed)
Relevant patient education assigned to patient using Emmi. ° °

## 2014-01-12 DIAGNOSIS — N182 Chronic kidney disease, stage 2 (mild): Secondary | ICD-10-CM | POA: Insufficient documentation

## 2014-01-12 MED ORDER — FUROSEMIDE 40 MG PO TABS: ORAL_TABLET | ORAL | Status: DC

## 2014-01-12 MED ORDER — SPIRONOLACTONE 100 MG PO TABS: 100.0000 mg | ORAL_TABLET | Freq: Every day | ORAL | Status: DC

## 2014-01-23 ENCOUNTER — Telehealth: Payer: Self-pay | Admitting: Family Medicine

## 2014-01-23 MED ORDER — OSELTAMIVIR PHOSPHATE 75 MG PO CAPS: ORAL_CAPSULE | ORAL | Status: DC

## 2014-01-23 NOTE — Telephone Encounter (Signed)
Tamiflu rx sent to pharmacy. Tell him I sent in a full course so he should just throw away the tamiflu tabs he has at home.-thx

## 2014-01-23 NOTE — Telephone Encounter (Signed)
Patient got sent home from work today with 100.7 fever.  He is also experiencing cough, congestion, and sneezing.  Please advise.

## 2014-01-23 NOTE — Telephone Encounter (Signed)
Patient aware.

## 2014-02-21 ENCOUNTER — Ambulatory Visit (INDEPENDENT_AMBULATORY_CARE_PROVIDER_SITE_OTHER): Payer: 59 | Admitting: Family Medicine

## 2014-02-21 ENCOUNTER — Encounter: Payer: Self-pay | Admitting: Family Medicine

## 2014-02-21 VITALS — BP 137/85 | HR 55 | Temp 97.7°F | Resp 18 | Ht 70.0 in | Wt 235.0 lb

## 2014-02-21 DIAGNOSIS — IMO0002 Reserved for concepts with insufficient information to code with codable children: Secondary | ICD-10-CM

## 2014-02-21 DIAGNOSIS — M5416 Radiculopathy, lumbar region: Secondary | ICD-10-CM

## 2014-02-21 MED ORDER — PREDNISONE 20 MG PO TABS: ORAL_TABLET | ORAL | Status: DC

## 2014-02-21 NOTE — Progress Notes (Signed)
Pre visit review using our clinic review tool, if applicable. No additional management support is needed unless otherwise documented below in the visit note. 

## 2014-02-21 NOTE — Progress Notes (Addendum)
OFFICE NOTE  02/21/14  CC:  Chief Complaint  Patient presents with  . Knee Pain    right side  . Hip Pain  . Back Pain    lower back      HPI: Patient is a 53 y.o. Caucasian male who is here for approx 7-10 days of right knee pain, right hip and low back pain.  Worse going up stairs, not as bad going down stairs.  Worse as the day goes on.  No trauma prior to pain. Was doing much more kneeling while working prior to onset of the pain. Feels like right leg is a bit weak as day goes on, left leg w/out any weakness.  No loss of bowel or bladder control. Tylenol 1000 mg repeatedly does not help much.  Heating pad, stretching, rest all of little help. Glucoses: highest in the last week was 200. Says abdomen feels fine.  Feet feel fine.  Bladder and bowel control are intact.  Pertinent PMH:  Past medical, surgical, social, and family history reviewed and no changes are noted since last office visit.  MEDS:  Not currently taking tamiflu Outpatient Prescriptions Prior to Visit  Medication Sig Dispense Refill  . furosemide (LASIX) 40 MG tablet 1 tab po bid  60 tablet  6  . gabapentin (NEURONTIN) 300 MG capsule Take 300 mg by mouth 2 (two) times daily.      [name] glucagon 1 MG injection Inject 1 mg into the vein once as needed. For hypoglycemia      . glucose blood (FREESTYLE LITE) test strip Use as directed to check blood sugar. DX 250.82  100 each  6  . insulin aspart (NOVOLOG) 100 UNIT/ML injection Inject 12 Units into the skin 3 (three) times daily before meals. Sliding scale in addition if needed  1 vial  6  . insulin glargine (LANTUS) 100 UNIT/ML injection Inject 0.42 mLs (42 Units total) into the skin at bedtime.  10 mL  6  . Insulin Pen Needle (NOVOFINE) 32G X 6 MM MISC USE WITH INSULIN PEN FOR 4 INJECTIONS DAILY  120 each  6  . Lancets (FREESTYLE) lancets PRN  100 each  6  . lisinopril (PRINIVIL,ZESTRIL) 20 MG tablet Take 1 tablet (20 mg total) by mouth daily.  30 tablet  6  .  propranolol (INDERAL) 40 MG tablet Take 1 tablet (40 mg total) by mouth 2 (two) times daily.  60 tablet  6  . sildenafil (VIAGRA) 50 MG tablet 1-2 tabs 30 min prior to intercourse  8 tablet  0  . spironolactone (ALDACTONE) 100 MG tablet Take 1 tablet (100 mg total) by mouth daily. 1/2 tab po qd  30 tablet  6  . oseltamivir (TAMIFLU) 75 MG capsule 1 tab po bid x 5d  10 capsule  0   No facility-administered medications prior to visit.    PE: Blood pressure 137/85, pulse 55, temperature 97.7 F (36.5 C), temperature source Temporal, resp. rate 18, height 5\' 10"  (1.778 m), weight 235 lb (106.595 kg), SpO2 100.00%. Gen: Alert, well appearing.  Patient is oriented to person, place, time, and situation. No peripheral edema.  No jaundice or pallor. Right LB TTP, right ischial tuberosity region TTP and right popliteal region TTP.  No mass palpable. Right hip ROM intact, no pain in hip with ROM. SLR positive on right but not until about 3/4 of full extension.   DTRs: patellar 1+ bilat, achilles DTRs absent bilat. LE strength 5/5 prox/dist bilat.  LABS:  none today  Past imaging: MRI L spine without contrast 08/25/11: miltilevel facet jt dz; small disc protrusion at L5-S1 to the right w/out nerve impingement.  IMPRESSION AND PLAN:  1) Acute R LB pain with right LE radiculopathy. Prednisone 40mg  qd x 3d, then 20mg  qd x 3d, then 10mg  qd x 3d. [facility] for [facility]--he has gone there in the past. Note for desk-duty for remainder of this week, may return to work w/out restrictions 02/24/14. He has previously scheduled routine f/u appt set with me for next week--keep this.  An After Visit Summary was printed and given to the patient.

## 2014-02-25 ENCOUNTER — Other Ambulatory Visit: Payer: Self-pay | Admitting: Family Medicine

## 2014-02-25 MED ORDER — GLUCAGON (RDNA) 1 MG IJ KIT: 1.0000 mg | PACK | Freq: Once | INTRAMUSCULAR | Status: DC | PRN

## 2014-02-25 NOTE — Telephone Encounter (Signed)
Pt request refill of glucagon emergency kit because his current kit has expired.

## 2014-02-27 ENCOUNTER — Telehealth: Payer: Self-pay

## 2014-02-27 ENCOUNTER — Ambulatory Visit: Payer: 59 | Attending: Family Medicine | Admitting: Physical Therapy

## 2014-02-27 DIAGNOSIS — M545 Low back pain, unspecified: Secondary | ICD-10-CM | POA: Insufficient documentation

## 2014-02-27 DIAGNOSIS — IMO0001 Reserved for inherently not codable concepts without codable children: Secondary | ICD-10-CM | POA: Insufficient documentation

## 2014-02-27 NOTE — Telephone Encounter (Signed)
Called pt, Advised message from Dr [name]. Pt seemed to be sure that his therapist stated he has a fracture at L-3. I advised to RTC to see Dr [name] so we can order other testing to figure this out. Pt agreed. FYI

## 2014-02-27 NOTE — Telephone Encounter (Signed)
Pls call pt. I reviewed all of his back x-rays and MRI. He does not have any fractures and does not need any additional x-rays. Reassure him.-thx

## 2014-02-27 NOTE — Telephone Encounter (Signed)
Spoke with pt, he states his xrays shows a fracture in her back, and a bulging disc. When he went for physical therapy, they told him this. He wants to know if he needed additional xrays. I don't see any xrays stating he has a fracture. The only MRI result was done back in 2012. I advised him to come in to talk to you but he wanted me to send a message first. Call back [phone] or work [phone]

## 2014-03-04 ENCOUNTER — Ambulatory Visit: Payer: 59 | Admitting: Rehabilitation

## 2014-03-06 ENCOUNTER — Ambulatory Visit: Payer: 59 | Admitting: Physical Therapy

## 2014-03-11 ENCOUNTER — Ambulatory Visit: Payer: 59 | Admitting: Rehabilitation

## 2014-03-13 ENCOUNTER — Ambulatory Visit: Payer: 59 | Admitting: Physical Therapy

## 2014-03-18 ENCOUNTER — Ambulatory Visit: Payer: 59 | Admitting: Rehabilitation

## 2014-03-20 ENCOUNTER — Ambulatory Visit: Payer: 59 | Admitting: Rehabilitation

## 2014-03-27 ENCOUNTER — Ambulatory Visit: Payer: 59 | Attending: Family Medicine | Admitting: Rehabilitation

## 2014-03-27 DIAGNOSIS — M545 Low back pain, unspecified: Secondary | ICD-10-CM | POA: Insufficient documentation

## 2014-03-27 DIAGNOSIS — IMO0001 Reserved for inherently not codable concepts without codable children: Secondary | ICD-10-CM | POA: Insufficient documentation

## 2014-04-01 ENCOUNTER — Ambulatory Visit: Payer: 59 | Admitting: Physical Therapy

## 2014-04-03 ENCOUNTER — Ambulatory Visit (INDEPENDENT_AMBULATORY_CARE_PROVIDER_SITE_OTHER): Payer: 59 | Admitting: Family Medicine

## 2014-04-03 ENCOUNTER — Encounter: Payer: Self-pay | Admitting: Family Medicine

## 2014-04-03 ENCOUNTER — Ambulatory Visit: Payer: 59 | Admitting: Family Medicine

## 2014-04-03 VITALS — BP 176/74 | HR 72 | Temp 97.8°F | Resp 18 | Ht 70.0 in | Wt 238.0 lb

## 2014-04-03 DIAGNOSIS — R112 Nausea with vomiting, unspecified: Secondary | ICD-10-CM

## 2014-04-03 DIAGNOSIS — IMO0001 Reserved for inherently not codable concepts without codable children: Secondary | ICD-10-CM

## 2014-04-03 DIAGNOSIS — N183 Chronic kidney disease, stage 3 unspecified: Secondary | ICD-10-CM

## 2014-04-03 DIAGNOSIS — R188 Other ascites: Secondary | ICD-10-CM

## 2014-04-03 DIAGNOSIS — K7460 Unspecified cirrhosis of liver: Secondary | ICD-10-CM

## 2014-04-03 DIAGNOSIS — D696 Thrombocytopenia, unspecified: Secondary | ICD-10-CM

## 2014-04-03 DIAGNOSIS — E1165 Type 2 diabetes mellitus with hyperglycemia: Secondary | ICD-10-CM

## 2014-04-03 DIAGNOSIS — R109 Unspecified abdominal pain: Secondary | ICD-10-CM

## 2014-04-03 DIAGNOSIS — R197 Diarrhea, unspecified: Secondary | ICD-10-CM

## 2014-04-03 LAB — CBC WITH DIFFERENTIAL/PLATELET
Basophils Absolute: 0.1 10*3/uL (ref 0.0–0.1)
Basophils Relative: 1.1 % (ref 0.0–3.0)
EOS PCT: 2 % (ref 0.0–5.0)
Eosinophils Absolute: 0.1 10*3/uL (ref 0.0–0.7)
HCT: 35.1 % — ABNORMAL LOW (ref 39.0–52.0)
Hemoglobin: 11.6 g/dL — ABNORMAL LOW (ref 13.0–17.0)
LYMPHS PCT: 12.7 % (ref 12.0–46.0)
Lymphs Abs: 0.7 10*3/uL (ref 0.7–4.0)
MCHC: 33 g/dL (ref 30.0–36.0)
MCV: 91.5 fl (ref 78.0–100.0)
MONO ABS: 0.4 10*3/uL (ref 0.1–1.0)
MONOS PCT: 7.2 % (ref 3.0–12.0)
NEUTROS PCT: 77 % (ref 43.0–77.0)
Neutro Abs: 4.2 10*3/uL (ref 1.4–7.7)
RBC: 3.84 Mil/uL — ABNORMAL LOW (ref 4.22–5.81)
RDW: 13.3 % (ref 11.5–14.6)
WBC: 5.5 10*3/uL (ref 4.5–10.5)

## 2014-04-03 LAB — COMPREHENSIVE METABOLIC PANEL
ALT: 23 U/L (ref 0–53)
AST: 27 U/L (ref 0–37)
Albumin: 3.3 g/dL — ABNORMAL LOW (ref 3.5–5.2)
Alkaline Phosphatase: 93 U/L (ref 39–117)
BILIRUBIN TOTAL: 0.9 mg/dL (ref 0.3–1.2)
BUN: 28 mg/dL — ABNORMAL HIGH (ref 6–23)
CHLORIDE: 107 meq/L (ref 96–112)
CO2: 28 mEq/L (ref 19–32)
Calcium: 9.3 mg/dL (ref 8.4–10.5)
Creatinine, Ser: 0.9 mg/dL (ref 0.4–1.5)
GFR: 90.81 mL/min (ref >60.00)
Glucose, Bld: 101 mg/dL — ABNORMAL HIGH (ref 70–99)
POTASSIUM: 5 meq/L (ref 3.5–5.1)
SODIUM: 139 meq/L (ref 135–145)
TOTAL PROTEIN: 6.5 g/dL (ref 6.0–8.3)

## 2014-04-03 LAB — HEMOGLOBIN A1C: HEMOGLOBIN A1C: 7.2 % — AB (ref 4.6–6.5)

## 2014-04-03 LAB — LIPASE: Lipase: 31 U/L (ref 11.0–59.0)

## 2014-04-03 MED ORDER — INSULIN ASPART 100 UNIT/ML ~~LOC~~ SOLN: 14.0000 [IU] | Freq: Three times a day (TID) | SUBCUTANEOUS | Status: DC

## 2014-04-03 NOTE — Progress Notes (Signed)
OFFICE NOTE  04/03/2014  CC:  Chief Complaint  Patient presents with  . Emesis    a few weeks, off and on  . Diarrhea     HPI: Patient is a 53 y.o. Caucasian male who is here for vomiting and diarrhea.   Night shift lately--the time when his glucoses are most erratic.  Couple weeks ago had a night of vomiting.  Stomach girgling a lot lately, nauseated feeling lots but only occ emesis since the initial bad night, feeling of having to have a BM frequently but doesn't have them frequently--but does have mid epigastric pains on and off and some watery BMs.  Eating makes him feel gassy/bloated/wants to burp. He has tried to cut out diet sodas, drink more water, eat more veggies and fruits.  Energy level is good.  Back is feeling a lot better with PT twice per week.  No fevers.  Then went another week and was okay but started having diarrhea again. One episode of hypoglycemia recently (60, +symptomatic, ate and this resolved). No glucose over 200 since I last saw him about 6 wks ago.   No known contacts with similar sx's.  Denies any alcohol intake at all.  Urine appears normal, normal volume, no dysuria or urgency or trouble urinating.  No CP, SOB, palpitaitons, HA's, vision changes, or persistent dizziness. No focal weakness.   Pertinent PMH:  Past Medical History  Diagnosis Date  . Heart murmur     ECHO 07/2011 showed mild AS and LVH.  TEE 09/2011 showed small PFO, bicuspid aortic valve but no stenosis or regurg.  . 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  F/u u/s for ongoing/worsening thrombocytopenia showed no more splenomegaly.  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  Gen surg and GI recommended flagyl and cipro x 2 wks..  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 Juanda Chance 12/01/11 showed HEALED fistula, no other abnormality, recommended  next colonoscopy 10 yrs.  . Cholelithiasis 09/2011; 08/2013    u/s--no cholecystitis  . Alcoholism   . Peripheral neuropathy 2012    Diabetic:  Autonomic (pelvic) and LE polyneuropathy--[facility] testing showed that it is likely from DM  . 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 Arthritis     back L3 - L5  . Hyperactive gag reflex   . History of pyelonephritis 09/2011  . Type II or unspecified type diabetes mellitus with neurological manifestations, not stated as uncontrolled     IDDM: neuro, renal, and ophth complications  . Wears dentures     upper  . Osteomyelitis of toe of left foot 10/22/12    left 4th and 5th toes--now s/p amputation of these areas  . Diabetic retinopathy associated with type 2 diabetes mellitus     Laser tx in both eyes  . Diabetic nephropathy 09/2013    Proteinuria and CrCl 55-65  . Chronic renal insufficiency, stage III (moderate)   . Lumbar spondylosis     MRI 08/25/11 with multilevel facet dz, small disc protrusion at L5-S1 to the right without impingement   Past Surgical History  Procedure Laterality Date  . Knee arthroscopy  2001    Bilateral  . Amputation  10/24/2012    Procedure: AMPUTATION RAY;  Surgeon: Sherri Rad, MD;  Location: [facility];  Service: Orthopedics;  Laterality: Left;  left 4th toe amputation through MTP joint, 5th Ray amputation   . Knee surgery  2001    MEDS:  Novolog 14 qAC and Lantus 42 U qhs Reports compliance with all pills as well.  No taking prednisone listed below. Outpatient Prescriptions Prior to Visit  Medication Sig Dispense Refill  . furosemide (LASIX) 40 MG tablet 1 tab po bid  60 tablet  6  . gabapentin (NEURONTIN) 300 MG capsule Take 300 mg by mouth 2 (two) times daily.      . insulin aspart (NOVOLOG) 100 UNIT/ML injection Inject 12 Units into the skin 3 (three) times daily before meals. Sliding scale in addition if needed  1 vial  6  . insulin glargine (LANTUS) 100  UNIT/ML injection Inject 0.42 mLs (42 Units total) into the skin at bedtime.  10 mL  6  . lisinopril (PRINIVIL,ZESTRIL) 20 MG tablet Take 1 tablet (20 mg total) by mouth daily.  30 tablet  6  . propranolol (INDERAL) 40 MG tablet Take 1 tablet (40 mg total) by mouth 2 (two) times daily.  60 tablet  6  . sildenafil (VIAGRA) 50 MG tablet 1-2 tabs 30 min prior to intercourse  8 tablet  0  . spironolactone (ALDACTONE) 100 MG tablet Take 1 tablet (100 mg total) by mouth daily. 1/2 tab po qd  30 tablet  6  . glucagon 1 MG injection Inject 1 mg into the vein once as needed. For hypoglycemia  1 each  2  . glucose blood (FREESTYLE LITE) test strip Use as directed to check blood sugar. DX 250.82  100 each  6  . Insulin Pen Needle (NOVOFINE) 32G X 6 MM MISC USE WITH INSULIN PEN FOR 4 INJECTIONS DAILY  120 each  6  . Lancets (FREESTYLE) lancets PRN  100 each  6  . predniSONE (DELTASONE) 20 MG tablet 2 tabs po qd x 3d, then 1 tab po qd x 3d, then 1/2 tab po qd x 4d  11 tablet  0   No facility-administered medications prior to visit.    PE: Blood pressure 176/74, pulse 72, temperature 97.8 F (36.6 C), temperature source Temporal, resp. rate 18, height 5\' 10"  (1.778 m), weight 238 lb (107.956 kg), SpO2 100.00%. Gen: Alert, well appearing.  Patient is oriented to person, place, time, and situation. AFFECT: pleasant, lucid thought and speech. ZOX:WRUE: no injection, icteris, swelling, or exudate.  EOMI, PERRLA. Mouth: lips without lesion/swelling.  Oral mucosa pink and moist. Oropharynx without erythema, exudate, or swelling.  Neck - No masses or thyromegaly or limitation in range of motion CV: RRR, no m/r/g.   LUNGS: CTA bilat, nonlabored resps, good aeration in all lung fields. ABD: soft, rotund --question of flank dullness/ascites but NOT tense.  Mild LUQ TTP.  No guarding or rebound.  BS normal.  No bruit or mass or HSM. EXT: no clubbing, cyanosis, or edema.   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   Has some bothersome, relatively persistent LUQ pain with all of this.  Plan is to check CBC, CMET, lipase, H pylori stool antigen, HbA1c. Wait on these results.  Will then likely repeat abd u/s to re-evaluate liver and spleen as well as pancreas.   An After Visit Summary was printed  and given to the patient.  FOLLOW UP: 1 wk

## 2014-04-03 NOTE — Progress Notes (Signed)
Pre visit review using our clinic review tool, if applicable. No additional management support is needed unless otherwise documented below in the visit note. 

## 2014-04-04 ENCOUNTER — Ambulatory Visit (HOSPITAL_BASED_OUTPATIENT_CLINIC_OR_DEPARTMENT_OTHER)
Admission: RE | Admit: 2014-04-04 | Discharge: 2014-04-04 | Disposition: A | Discharge status: Home / Self Care | Payer: 59 | Source: Ambulatory Visit | Attending: Family Medicine | Admitting: Family Medicine

## 2014-04-04 ENCOUNTER — Ambulatory Visit: Payer: 59 | Admitting: Physical Therapy

## 2014-04-04 DIAGNOSIS — R197 Diarrhea, unspecified: Secondary | ICD-10-CM | POA: Insufficient documentation

## 2014-04-04 DIAGNOSIS — K7460 Unspecified cirrhosis of liver: Secondary | ICD-10-CM | POA: Insufficient documentation

## 2014-04-04 DIAGNOSIS — R112 Nausea with vomiting, unspecified: Secondary | ICD-10-CM | POA: Insufficient documentation

## 2014-04-04 DIAGNOSIS — D696 Thrombocytopenia, unspecified: Secondary | ICD-10-CM | POA: Insufficient documentation

## 2014-04-04 DIAGNOSIS — R109 Unspecified abdominal pain: Secondary | ICD-10-CM

## 2014-04-04 DIAGNOSIS — R188 Other ascites: Secondary | ICD-10-CM

## 2014-04-07 ENCOUNTER — Ambulatory Visit: Payer: 59 | Admitting: Rehabilitation

## 2014-04-09 ENCOUNTER — Ambulatory Visit: Payer: 59 | Admitting: Rehabilitation

## 2014-04-10 ENCOUNTER — Ambulatory Visit: Payer: 59 | Admitting: Family Medicine

## 2014-04-12 ENCOUNTER — Other Ambulatory Visit: Payer: Self-pay | Admitting: Family Medicine

## 2014-04-14 ENCOUNTER — Ambulatory Visit (INDEPENDENT_AMBULATORY_CARE_PROVIDER_SITE_OTHER): Payer: 59 | Admitting: Family Medicine

## 2014-04-14 ENCOUNTER — Encounter: Payer: Self-pay | Admitting: Family Medicine

## 2014-04-14 VITALS — BP 156/76 | HR 69 | Temp 99.5°F | Resp 18 | Ht 70.0 in | Wt 245.0 lb

## 2014-04-14 DIAGNOSIS — R140 Abdominal distension (gaseous): Secondary | ICD-10-CM | POA: Insufficient documentation

## 2014-04-14 DIAGNOSIS — D696 Thrombocytopenia, unspecified: Secondary | ICD-10-CM

## 2014-04-14 DIAGNOSIS — E118 Type 2 diabetes mellitus with unspecified complications: Secondary | ICD-10-CM

## 2014-04-14 DIAGNOSIS — R143 Flatulence: Secondary | ICD-10-CM

## 2014-04-14 DIAGNOSIS — E1165 Type 2 diabetes mellitus with hyperglycemia: Secondary | ICD-10-CM

## 2014-04-14 DIAGNOSIS — R142 Eructation: Secondary | ICD-10-CM

## 2014-04-14 DIAGNOSIS — IMO0002 Reserved for concepts with insufficient information to code with codable children: Secondary | ICD-10-CM

## 2014-04-14 DIAGNOSIS — K529 Noninfective gastroenteritis and colitis, unspecified: Secondary | ICD-10-CM | POA: Insufficient documentation

## 2014-04-14 DIAGNOSIS — K5289 Other specified noninfective gastroenteritis and colitis: Secondary | ICD-10-CM

## 2014-04-14 DIAGNOSIS — K7460 Unspecified cirrhosis of liver: Secondary | ICD-10-CM

## 2014-04-14 DIAGNOSIS — R141 Gas pain: Secondary | ICD-10-CM

## 2014-04-14 MED ORDER — SPIRONOLACTONE 100 MG PO TABS: ORAL_TABLET | ORAL | Status: DC

## 2014-04-14 NOTE — Progress Notes (Signed)
Pre visit review using our clinic review tool, if applicable. No additional management support is needed unless otherwise documented below in the visit note. 

## 2014-04-14 NOTE — Progress Notes (Signed)
OFFICE NOTE  04/14/2014  CC:  Chief Complaint  Patient presents with  . Abdominal Pain     HPI: Patient is a 53 y.o. Caucasian male who is here for 10d f/u GI sx's (nausea, epigastric pain intermittently, diarrhea, bloating).  Last visit labs showed good lytes/liver enzymes/cr and an abd u/s was remarkable only for previously dx'd cirrhosis (no ascites and no signif splenomegaly).  His platelets were 44K.  I ordered h pylori stool antigen but he never brought in a stool sample.  His A1c was much improved at 7.2%.  His nausea and diarrhea has resolved completely.  Still has some bloating 1-2 hours after eating lunch when he typically eats his largest meal.  No particular type of food is the culprit, more the size of the meal.  No abd pain, no anorexia, no urinary complaints.  ROS: no excessive bruising, no bleeding noted in urine or stool or nose.  No fevers. No dizziness, no CP, no SOB.  No skin color change.  Pertinent PMH:  Past medical, surgical, social, and family history reviewed and no changes are noted since last office visit.  MEDS:  Outpatient Prescriptions Prior to Visit  Medication Sig Dispense Refill  . furosemide (LASIX) 40 MG tablet 1 tab po bid  60 tablet  6  . gabapentin (NEURONTIN) 300 MG capsule Take 300 mg by mouth 2 (two) times daily.      [name]. glucagon 1 MG injection Inject 1 mg into the vein once as needed. For hypoglycemia  1 each  2  . glucose blood (FREESTYLE LITE) test strip Use as directed to check blood sugar. DX 250.82  100 each  6  . insulin aspart (NOVOLOG) 100 UNIT/ML injection Inject 14 Units into the skin 3 (three) times daily before meals. Sliding scale in addition if needed  1 vial  6  . insulin glargine (LANTUS) 100 UNIT/ML injection Inject 0.42 mLs (42 Units total) into the skin at bedtime.  10 mL  6  . Insulin Pen Needle (NOVOFINE) 32G X 6 MM MISC USE WITH INSULIN PEN FOR 4 INJECTIONS DAILY  120 each  6  . Lancets (FREESTYLE) lancets PRN  100 each  6   . lisinopril (PRINIVIL,ZESTRIL) 20 MG tablet Take 1 tablet (20 mg total) by mouth daily.  30 tablet  6  . propranolol (INDERAL) 40 MG tablet Take 1 tablet (40 mg total) by mouth 2 (two) times daily.  60 tablet  6  . sildenafil (VIAGRA) 50 MG tablet 1-2 tabs 30 min prior to intercourse  8 tablet  0  . spironolactone (ALDACTONE) 100 MG tablet Take 1 tablet (100 mg total) by mouth daily. 1/2 tab po qd  30 tablet  6  . lisinopril (PRINIVIL,ZESTRIL) 20 MG tablet TAKE 1 TABLET (20 MG TOTAL) BY MOUTH DAILY.  30 tablet  6   No facility-administered medications prior to visit.  Spironolactone dosing is 1/2 tab qd  PE: Blood pressure 156/76, pulse 69, temperature 99.5 F (37.5 C), temperature source Temporal, resp. rate 18, height 5\' 10"  (1.778 m), weight 245 lb (111.131 kg), SpO2 97.00%. Gen: Alert, well appearing.  Patient is oriented to person, place, time, and situation. CV: RRR, no m/r/g.   LUNGS: CTA bilat, nonlabored resps, good aeration in all lung fields. ABD: rotund, BS a bit hyperactive but no high pitched tinkling or rushes. No mass or HSM or bruits.  Mild discomfort with palpation in sub-xyphoid area. EXT: 2 + pitting edema bilat pretibial regions Skin -  no sores or suspicious lesions or rashes or color changes  LABS: none today   IMPRESSION AND PLAN:  1) Acute GI illness, resolved.  I think he also has some diabetic gastroparesis that is affecting him at his large meals. I advised him to split this large meal into two smaller meals and see if this helps.  No meds rx'd for this at this time. No further testing at this time.  2) Thrombocytopenia, chronic.  I believe this to be due to his cirrhosis with portal HTN/splenic congestion but his spleen was only ULN size on recent f/u u/s.  Will recheck CBC today to make sure these have stabilized or come up some. If still dropping will ask hematologist to see him.  3) DM 2, recent HbA1c 7.2%.  The current medical regimen is effective;   continue present plan and medications.  4) Cirrhosis (alcoholic) with portal htn, hx of ascites and splenomegaly. Stable.  He is no longer drinking at all.    An After Visit Summary was printed and given to the patient.  FOLLOW UP: 4 mo

## 2014-04-15 ENCOUNTER — Telehealth: Payer: Self-pay

## 2014-04-15 ENCOUNTER — Ambulatory Visit: Payer: 59 | Admitting: Physical Therapy

## 2014-04-15 LAB — CBC WITH DIFFERENTIAL/PLATELET
BASOS PCT: 0.9 % (ref 0.0–3.0)
Basophils Absolute: 0 10*3/uL (ref 0.0–0.1)
EOS PCT: 1.8 % (ref 0.0–5.0)
Eosinophils Absolute: 0.1 10*3/uL (ref 0.0–0.7)
HEMATOCRIT: 30.2 % — AB (ref 39.0–52.0)
Hemoglobin: 10.1 g/dL — ABNORMAL LOW (ref 13.0–17.0)
LYMPHS ABS: 0.5 10*3/uL — AB (ref 0.7–4.0)
Lymphocytes Relative: 12.5 % (ref 12.0–46.0)
MCHC: 33.3 g/dL (ref 30.0–36.0)
MCV: 91 fl (ref 78.0–100.0)
MONO ABS: 0.2 10*3/uL (ref 0.1–1.0)
Monocytes Relative: 5.2 % (ref 3.0–12.0)
Neutro Abs: 3.4 10*3/uL (ref 1.4–7.7)
Neutrophils Relative %: 79.6 % — ABNORMAL HIGH (ref 43.0–77.0)
Platelets: 36 10*3/uL — CL (ref 150.0–400.0)
RBC: 3.32 Mil/uL — AB (ref 4.22–5.81)
RDW: 13.8 % (ref 11.5–14.6)
WBC: 4.3 10*3/uL — AB (ref 4.5–10.5)

## 2014-04-15 NOTE — Telephone Encounter (Signed)
[facility] called with a critical lab, platelets 36,000

## 2014-04-16 ENCOUNTER — Ambulatory Visit: Payer: 59

## 2014-04-16 DIAGNOSIS — D649 Anemia, unspecified: Secondary | ICD-10-CM

## 2014-04-16 LAB — IBC PANEL
Iron: 50 ug/dL (ref 42–165)
Saturation Ratios: 17.9 % — ABNORMAL LOW (ref 20.0–50.0)
Transferrin: 199.6 mg/dL — ABNORMAL LOW (ref 212.0–360.0)

## 2014-04-16 LAB — FOLATE: FOLATE: 12.8 ng/mL (ref >5.9)

## 2014-04-16 LAB — VITAMIN B12: Vitamin B-12: 310 pg/mL (ref 211–911)

## 2014-04-16 LAB — FERRITIN: Ferritin: 340.4 ng/mL — ABNORMAL HIGH (ref 22.0–322.0)

## 2014-04-17 ENCOUNTER — Ambulatory Visit: Payer: 59 | Admitting: Physical Therapy

## 2014-04-17 ENCOUNTER — Other Ambulatory Visit: Payer: Self-pay | Admitting: Family Medicine

## 2014-04-17 DIAGNOSIS — D61818 Other pancytopenia: Secondary | ICD-10-CM

## 2014-04-17 DIAGNOSIS — K7460 Unspecified cirrhosis of liver: Secondary | ICD-10-CM

## 2014-04-18 ENCOUNTER — Telehealth: Payer: Self-pay | Admitting: Family Medicine

## 2014-04-18 MED ORDER — PROMETHAZINE HCL 25 MG PO TABS: 25.0000 mg | ORAL_TABLET | Freq: Four times a day (QID) | ORAL | Status: DC | PRN

## 2014-04-18 NOTE — Telephone Encounter (Signed)
Patient states that he is still not feeling well since his visit last Monday. He is still vomiting and having diarrhea/stomach pains and wanted to know what else he should do?

## 2014-04-18 NOTE — Telephone Encounter (Signed)
Patient is going to come in on Monday to get a stool kit and CBC blood work as requesting in his result note.  Patient said he may try to come by today to at least get the stool kit if he can get off work in time.  Generic Phenergan 25 mg sent in per Dr. Anitra Lauth verbal order. Patient aware.

## 2014-04-23 ENCOUNTER — Ambulatory Visit: Payer: 59 | Attending: Family Medicine | Admitting: Physical Therapy

## 2014-04-23 DIAGNOSIS — M545 Low back pain, unspecified: Secondary | ICD-10-CM | POA: Insufficient documentation

## 2014-04-23 DIAGNOSIS — IMO0001 Reserved for inherently not codable concepts without codable children: Secondary | ICD-10-CM | POA: Insufficient documentation

## 2014-04-24 ENCOUNTER — Ambulatory Visit: Payer: 59 | Admitting: Family Medicine

## 2014-06-18 HISTORY — PX: OTHER SURGICAL HISTORY: SHX169

## 2014-06-26 ENCOUNTER — Encounter: Payer: Self-pay | Admitting: Family Medicine

## 2014-06-26 ENCOUNTER — Ambulatory Visit (INDEPENDENT_AMBULATORY_CARE_PROVIDER_SITE_OTHER): Payer: 59 | Admitting: Family Medicine

## 2014-06-26 VITALS — BP 176/99 | HR 71 | Temp 98.3°F | Resp 18 | Ht 70.0 in | Wt 243.0 lb

## 2014-06-26 DIAGNOSIS — K8020 Calculus of gallbladder without cholecystitis without obstruction: Secondary | ICD-10-CM

## 2014-06-26 DIAGNOSIS — K7031 Alcoholic cirrhosis of liver with ascites: Secondary | ICD-10-CM

## 2014-06-26 DIAGNOSIS — R142 Eructation: Secondary | ICD-10-CM

## 2014-06-26 DIAGNOSIS — E1165 Type 2 diabetes mellitus with hyperglycemia: Secondary | ICD-10-CM

## 2014-06-26 DIAGNOSIS — K7030 Alcoholic cirrhosis of liver without ascites: Secondary | ICD-10-CM

## 2014-06-26 DIAGNOSIS — E118 Type 2 diabetes mellitus with unspecified complications: Secondary | ICD-10-CM

## 2014-06-26 DIAGNOSIS — IMO0001 Reserved for inherently not codable concepts without codable children: Secondary | ICD-10-CM

## 2014-06-26 DIAGNOSIS — R110 Nausea: Secondary | ICD-10-CM

## 2014-06-26 DIAGNOSIS — R140 Abdominal distension (gaseous): Secondary | ICD-10-CM

## 2014-06-26 DIAGNOSIS — E162 Hypoglycemia, unspecified: Secondary | ICD-10-CM

## 2014-06-26 DIAGNOSIS — IMO0002 Reserved for concepts with insufficient information to code with codable children: Secondary | ICD-10-CM

## 2014-06-26 DIAGNOSIS — R141 Gas pain: Secondary | ICD-10-CM

## 2014-06-26 DIAGNOSIS — R143 Flatulence: Secondary | ICD-10-CM

## 2014-06-26 LAB — CBC WITH DIFFERENTIAL/PLATELET
BASOS PCT: 0.3 % (ref 0.0–3.0)
Basophils Absolute: 0 10*3/uL (ref 0.0–0.1)
EOS ABS: 0.1 10*3/uL (ref 0.0–0.7)
EOS PCT: 2.7 % (ref 0.0–5.0)
HCT: 35.9 % — ABNORMAL LOW (ref 39.0–52.0)
Hemoglobin: 11.6 g/dL — ABNORMAL LOW (ref 13.0–17.0)
Lymphocytes Relative: 11.2 % — ABNORMAL LOW (ref 12.0–46.0)
Lymphs Abs: 0.6 10*3/uL — ABNORMAL LOW (ref 0.7–4.0)
MCHC: 32.3 g/dL (ref 30.0–36.0)
MCV: 88.9 fl (ref 78.0–100.0)
MONOS PCT: 8.8 % (ref 3.0–12.0)
Monocytes Absolute: 0.4 10*3/uL (ref 0.1–1.0)
NEUTROS PCT: 77 % (ref 43.0–77.0)
Neutro Abs: 3.9 10*3/uL (ref 1.4–7.7)
Platelets: 63 10*3/uL — ABNORMAL LOW (ref 150.0–400.0)
RBC: 4.03 Mil/uL — AB (ref 4.22–5.81)
RDW: 14.3 % (ref 11.5–15.5)
WBC: 5.1 10*3/uL (ref 4.0–10.5)

## 2014-06-26 LAB — COMPREHENSIVE METABOLIC PANEL
ALBUMIN: 3.1 g/dL — AB (ref 3.5–5.2)
ALK PHOS: 119 U/L — AB (ref 39–117)
ALT: 20 U/L (ref 0–53)
AST: 31 U/L (ref 0–37)
BUN: 19 mg/dL (ref 6–23)
CO2: 26 mEq/L (ref 19–32)
Calcium: 9.6 mg/dL (ref 8.4–10.5)
Chloride: 105 mEq/L (ref 96–112)
Creatinine, Ser: 0.9 mg/dL (ref 0.4–1.5)
GFR: 90.73 mL/min (ref >60.00)
GLUCOSE: 95 mg/dL (ref 70–99)
Potassium: 6 mEq/L — ABNORMAL HIGH (ref 3.5–5.1)
SODIUM: 138 meq/L (ref 135–145)
TOTAL PROTEIN: 5.6 g/dL — AB (ref 6.0–8.3)
Total Bilirubin: 0.7 mg/dL (ref 0.2–1.2)

## 2014-06-26 LAB — LIPASE: LIPASE: 83 U/L — AB (ref 11.0–59.0)

## 2014-06-26 LAB — GLUCOSE, POCT (MANUAL RESULT ENTRY): POC Glucose: 92 mg/dl (ref 70–99)

## 2014-06-26 LAB — HEMOGLOBIN A1C: Hgb A1c MFr Bld: 7.4 % — ABNORMAL HIGH (ref 4.6–6.5)

## 2014-06-26 MED ORDER — PROMETHAZINE HCL 25 MG PO TABS: 25.0000 mg | ORAL_TABLET | Freq: Four times a day (QID) | ORAL | Status: DC | PRN

## 2014-06-26 NOTE — Assessment & Plan Note (Signed)
With nausea but no vomiting. He has hx of gallstones but no hx of acute cholecystitis. He very likely has a component of diabetic gastroparesis. Will start with complete abd u/s.  Check CMET, lipase, and CBC today. Phenergan rF'd.

## 2014-06-26 NOTE — Progress Notes (Signed)
OFFICE VISIT  06/26/2014   CC:  Chief Complaint  Patient presents with  . Loss of Vision  . Diarrhea  . Blood Sugar Problem  . Night Sweats    HPI:    Patient is a 53 y.o. Caucasian male who presents for several complaints--feeling sick today and was sent here from work.   Woke up early this morning and felt sweaty, tunnel vision, light headed, gluc 45.  Says this happened four days in a row last week.  For the last 2 wks, feels postprandial bloating, nausea, noticing increased LE swelling lately. Some SOB lately ONLY when he bends over (esp after eating), when he sits back upright he is normal.  No chest pain.  Has recently gone from day shift to night shift. Glucoses fluctuating wildly as per his usual.  Has had laser treatment on both eyes in the months of may and June recently.   Past Medical History  Diagnosis Date  . Heart murmur     ECHO 07/2011 showed mild AS and LVH.  TEE 09/2011 showed small PFO, bicuspid aortic valve but no stenosis or regurg.  . 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  F/u u/s for ongoing/worsening thrombocytopenia showed no more splenomegaly.  [name]. 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  Gen surg and GI recommended flagyl and cipro x 2 wks..  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  . Cholelithiasis 09/2011; 08/2013    u/s--no cholecystitis  . Alcoholism   . Peripheral neuropathy 2012    Diabetic:  Autonomic (pelvic) and LE polyneuropathy--[facility] testing showed that it is likely from DM  . 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 Arthritis     back L3 - L5  . Hyperactive gag reflex   . History of pyelonephritis 09/2011  . Type II or unspecified type diabetes mellitus with  neurological manifestations, not stated as uncontrolled     IDDM: neuro, renal, and ophth complications  . Wears dentures     upper  . Osteomyelitis of toe of left foot 10/22/12    left 4th and 5th toes--now s/p amputation of these areas  . Diabetic retinopathy associated with type 2 diabetes mellitus     Laser tx in both eyes  . Diabetic nephropathy 09/2013    Proteinuria and CrCl 55-65  . Chronic renal insufficiency, stage III (moderate)   . Lumbar spondylosis     MRI 08/25/11 with multilevel facet dz, small disc protrusion at L5-S1 to the right without impingement    Past Surgical History  Procedure Laterality Date  . Knee arthroscopy  2001    Bilateral  . Amputation  10/24/2012    Procedure: AMPUTATION RAY;  Surgeon: [name] A Bednarz, MD;  Location: [facility];  Service: Orthopedics;  Laterality: Left;  left 4th toe amputation through MTP joint, 5th Ray amputation   . Knee surgery  2001    Outpatient Prescriptions Prior to Visit  Medication Sig Dispense Refill  . furosemide (LASIX) 40 MG tablet 1 tab po bid  60 tablet  6  . gabapentin (NEURONTIN) 300 MG capsule Take 300 mg by mouth 2 (two) times daily.      . insulin aspart (NOVOLOG) 100 UNIT/ML injection  Inject 14 Units into the skin 3 (three) times daily before meals. Sliding scale in addition if needed  1 vial  6  . insulin glargine (LANTUS) 100 UNIT/ML injection Inject 0.42 mLs (42 Units total) into the skin at bedtime.  10 mL  6  . lisinopril (PRINIVIL,ZESTRIL) 20 MG tablet Take 1 tablet (20 mg total) by mouth daily.  30 tablet  6  . propranolol (INDERAL) 40 MG tablet Take 1 tablet (40 mg total) by mouth 2 (two) times daily.  60 tablet  6  . spironolactone (ALDACTONE) 100 MG tablet 1/2 tab po qd  30 tablet  6  . glucagon 1 MG injection Inject 1 mg into the vein once as needed. For hypoglycemia  1 each  2  . glucose blood (FREESTYLE LITE) test strip Use as directed to check blood sugar. DX 250.82  100 each  6  .  Insulin Pen Needle (NOVOFINE) 32G X 6 MM MISC USE WITH INSULIN PEN FOR 4 INJECTIONS DAILY  120 each  6  . Lancets (FREESTYLE) lancets PRN  100 each  6  . sildenafil (VIAGRA) 50 MG tablet 1-2 tabs 30 min prior to intercourse  8 tablet  0  . promethazine (PHENERGAN) 25 MG tablet Take 1 tablet (25 mg total) by mouth every 6 (six) hours as needed for nausea or vomiting.  20 tablet  0   No facility-administered medications prior to visit.    Allergies  Allergen Reactions  . Hydralazine Hcl Hives  . Naproxen Nausea And Vomiting  . Other Itching and Other (See Comments)    PERFUMES - SNEEZING   ROS As per HPI  PE: Blood pressure 176/99, pulse 71, temperature 98.3 F (36.8 C), temperature source Temporal, resp. rate 18, height 5\' 10"  (1.778 m), weight 243 lb (110.224 kg), SpO2 99.00%. Gen: alert, tired appearing but NAD.  Lucid thought and speech OMA:YOKH: no injection, icteris, swelling, or exudate.  EOMI, PERRLA. Mouth: lips without lesion/swelling.  Oral mucosa pink and moist. Oropharynx without erythema, exudate, or swelling.  CV: RRR, no m/r/g.   LUNGS: CTA bilat, nonlabored resps, good aeration in all lung fields. ABD: rotund, no tight distention but flank dullness noted.  BS normal.  TTP in mid epigastric area and less tender in RUQ. No guarding or rebound.  No bruit or HSM.  No lower abd tenderness. EXT: 3 + pretibial and ankle edema bilat, with chronic venous stasis dermatitis noted.  LABS:  CBG in office today was 92  IMPRESSION AND PLAN:  Diabetes mellitus type 2, uncontrolled, with complications Hx of labile glucose control, peripheral neuropathy, hypoglycemia lately (and in the past due to erratic eating/activity habits associated with changing work shifts every 2 wks). Decrease lantus to 38 U qhs and decrease novolog to 10 U qhs---for now I want to avoid hypoglycemia while we do some testing. Will likely ask endocrinologist for help with him in the near future. HbA1c  today.  Postprandial bloating With nausea but no vomiting. He has hx of gallstones but no hx of acute cholecystitis. He very likely has a component of diabetic gastroparesis. Will start with complete abd u/s.  Check CMET, lipase, and CBC today. Phenergan rF'd.  Cirrhosis With portal HTN and ascites.   Getting abd u/s. Checking LFTs.   An After Visit Summary was printed and given to the patient.  FOLLOW UP: Return in about 5 days (around 07/01/2014) for f/u dm and abd issues.

## 2014-06-26 NOTE — Progress Notes (Signed)
Pre visit review using our clinic review tool, if applicable. No additional management support is needed unless otherwise documented below in the visit note. 

## 2014-06-26 NOTE — Assessment & Plan Note (Signed)
With portal HTN and ascites.   Getting abd u/s. Checking LFTs.

## 2014-06-26 NOTE — Assessment & Plan Note (Addendum)
Hx of labile glucose control, peripheral neuropathy, hypoglycemia lately (and in the past due to erratic eating/activity habits associated with changing work shifts every 2 wks). Decrease lantus to 38 U qhs and decrease novolog to 10 U qhs---for now I want to avoid hypoglycemia while we do some testing. Will likely ask endocrinologist for help with him in the near future. HbA1c today.

## 2014-06-26 NOTE — Patient Instructions (Signed)
Decrease your lantus to 38 U at bedtime and decrease your novolog to 10 Units with each meal.  Do not take your lantus tonight: you will have to eat a bedtime snack and then NOT EAT AFTER THAT until after you have your abdominal ultrasound tomorrow morning.

## 2014-06-27 ENCOUNTER — Ambulatory Visit (HOSPITAL_BASED_OUTPATIENT_CLINIC_OR_DEPARTMENT_OTHER)
Admission: RE | Admit: 2014-06-27 | Discharge: 2014-06-27 | Disposition: A | Discharge status: Home / Self Care | Payer: 59 | Source: Ambulatory Visit | Attending: Family Medicine | Admitting: Family Medicine

## 2014-06-27 DIAGNOSIS — R141 Gas pain: Secondary | ICD-10-CM | POA: Insufficient documentation

## 2014-06-27 DIAGNOSIS — R143 Flatulence: Secondary | ICD-10-CM

## 2014-06-27 DIAGNOSIS — R140 Abdominal distension (gaseous): Secondary | ICD-10-CM

## 2014-06-27 DIAGNOSIS — R110 Nausea: Secondary | ICD-10-CM | POA: Insufficient documentation

## 2014-06-27 DIAGNOSIS — K8020 Calculus of gallbladder without cholecystitis without obstruction: Secondary | ICD-10-CM | POA: Insufficient documentation

## 2014-06-27 DIAGNOSIS — E1165 Type 2 diabetes mellitus with hyperglycemia: Secondary | ICD-10-CM

## 2014-06-27 DIAGNOSIS — R142 Eructation: Secondary | ICD-10-CM

## 2014-06-27 DIAGNOSIS — K7460 Unspecified cirrhosis of liver: Secondary | ICD-10-CM | POA: Insufficient documentation

## 2014-06-27 DIAGNOSIS — IMO0001 Reserved for inherently not codable concepts without codable children: Secondary | ICD-10-CM | POA: Insufficient documentation

## 2014-06-27 DIAGNOSIS — J90 Pleural effusion, not elsewhere classified: Secondary | ICD-10-CM | POA: Insufficient documentation

## 2014-07-01 ENCOUNTER — Other Ambulatory Visit: Payer: Self-pay | Admitting: Family Medicine

## 2014-07-01 ENCOUNTER — Telehealth: Payer: Self-pay | Admitting: Family Medicine

## 2014-07-01 DIAGNOSIS — Z8719 Personal history of other diseases of the digestive system: Secondary | ICD-10-CM

## 2014-07-01 DIAGNOSIS — K828 Other specified diseases of gallbladder: Secondary | ICD-10-CM

## 2014-07-01 DIAGNOSIS — R140 Abdominal distension (gaseous): Secondary | ICD-10-CM

## 2014-07-01 NOTE — Telephone Encounter (Signed)
Done

## 2014-07-01 NOTE — Telephone Encounter (Signed)
Pt calling requesting results of tests that you sent him to on Friday.  Please advise.

## 2014-07-03 ENCOUNTER — Ambulatory Visit: Payer: 59 | Admitting: Family Medicine

## 2014-07-04 ENCOUNTER — Ambulatory Visit (INDEPENDENT_AMBULATORY_CARE_PROVIDER_SITE_OTHER): Payer: 59 | Admitting: Family Medicine

## 2014-07-04 ENCOUNTER — Encounter: Payer: Self-pay | Admitting: Family Medicine

## 2014-07-04 ENCOUNTER — Ambulatory Visit (HOSPITAL_COMMUNITY)
Admission: RE | Admit: 2014-07-04 | Discharge: 2014-07-04 | Disposition: A | Discharge status: Home / Self Care | Payer: 59 | Source: Ambulatory Visit | Attending: Family Medicine | Admitting: Family Medicine

## 2014-07-04 VITALS — BP 162/96 | HR 66 | Temp 98.3°F | Resp 18 | Ht 70.0 in | Wt 235.0 lb

## 2014-07-04 DIAGNOSIS — R143 Flatulence: Secondary | ICD-10-CM

## 2014-07-04 DIAGNOSIS — E875 Hyperkalemia: Secondary | ICD-10-CM

## 2014-07-04 DIAGNOSIS — K859 Acute pancreatitis without necrosis or infection, unspecified: Secondary | ICD-10-CM

## 2014-07-04 DIAGNOSIS — R1084 Generalized abdominal pain: Secondary | ICD-10-CM

## 2014-07-04 DIAGNOSIS — Z8719 Personal history of other diseases of the digestive system: Secondary | ICD-10-CM

## 2014-07-04 DIAGNOSIS — R140 Abdominal distension (gaseous): Secondary | ICD-10-CM

## 2014-07-04 DIAGNOSIS — R142 Eructation: Secondary | ICD-10-CM

## 2014-07-04 DIAGNOSIS — R141 Gas pain: Secondary | ICD-10-CM | POA: Insufficient documentation

## 2014-07-04 DIAGNOSIS — E109 Type 1 diabetes mellitus without complications: Secondary | ICD-10-CM

## 2014-07-04 DIAGNOSIS — R109 Unspecified abdominal pain: Secondary | ICD-10-CM | POA: Insufficient documentation

## 2014-07-04 DIAGNOSIS — K7030 Alcoholic cirrhosis of liver without ascites: Secondary | ICD-10-CM

## 2014-07-04 DIAGNOSIS — K828 Other specified diseases of gallbladder: Secondary | ICD-10-CM

## 2014-07-04 DIAGNOSIS — E119 Type 2 diabetes mellitus without complications: Secondary | ICD-10-CM

## 2014-07-04 DIAGNOSIS — K2970 Gastritis, unspecified, without bleeding: Secondary | ICD-10-CM

## 2014-07-04 DIAGNOSIS — K7031 Alcoholic cirrhosis of liver with ascites: Secondary | ICD-10-CM

## 2014-07-04 DIAGNOSIS — K2990 Gastroduodenitis, unspecified, without bleeding: Secondary | ICD-10-CM

## 2014-07-04 DIAGNOSIS — R112 Nausea with vomiting, unspecified: Secondary | ICD-10-CM | POA: Insufficient documentation

## 2014-07-04 LAB — PROTIME-INR
INR: 0.9 ratio (ref 0.8–1.0)
Prothrombin Time: 10.5 s (ref 9.6–13.1)

## 2014-07-04 LAB — COMPREHENSIVE METABOLIC PANEL
ALT: 18 U/L (ref 0–53)
AST: 28 U/L (ref 0–37)
Albumin: 3.1 g/dL — ABNORMAL LOW (ref 3.5–5.2)
Alkaline Phosphatase: 122 U/L — ABNORMAL HIGH (ref 39–117)
BILIRUBIN TOTAL: 0.4 mg/dL (ref 0.2–1.2)
BUN: 32 mg/dL — ABNORMAL HIGH (ref 6–23)
CO2: 26 meq/L (ref 19–32)
Calcium: 8.9 mg/dL (ref 8.4–10.5)
Chloride: 103 mEq/L (ref 96–112)
Creatinine, Ser: 1.1 mg/dL (ref 0.4–1.5)
GFR: 75.53 mL/min (ref >60.00)
GLUCOSE: 100 mg/dL — AB (ref 70–99)
Potassium: 4.6 mEq/L (ref 3.5–5.1)
Sodium: 139 mEq/L (ref 135–145)
Total Protein: 5.9 g/dL — ABNORMAL LOW (ref 6.0–8.3)

## 2014-07-04 LAB — LIPASE: LIPASE: 37 U/L (ref 11.0–59.0)

## 2014-07-04 LAB — H. PYLORI ANTIBODY, IGG: H Pylori IgG: NEGATIVE

## 2014-07-04 MED ORDER — PANTOPRAZOLE SODIUM 40 MG PO TBEC: 40.0000 mg | DELAYED_RELEASE_TABLET | Freq: Every day | ORAL | Status: DC

## 2014-07-04 MED ORDER — RANITIDINE HCL 150 MG PO TABS
150.0000 mg | ORAL_TABLET | Freq: Every day | ORAL | Status: DC
Start: 2014-07-04 — End: 2014-10-04

## 2014-07-04 MED ORDER — TECHNETIUM TC 99M MEBROFENIN IV KIT
5.5000 | PACK | Freq: Once | INTRAVENOUS | Status: AC | PRN
  Administered 2014-07-04: 6 via INTRAVENOUS

## 2014-07-04 NOTE — Patient Instructions (Addendum)
Continue 38 U lantus at bedtime and 10 U novolog with each meal.  Decrease lasix (furosemide) to 40mg  twice per day.  Continue to HOLD aldactone (spironolactone).  Start pantoprazole 40mg  every morning and ranitidine 150 mg every night at bedtime.

## 2014-07-04 NOTE — Progress Notes (Signed)
Pre visit review using our clinic review tool, if applicable. No additional management support is needed unless otherwise documented below in the visit note. 

## 2014-07-04 NOTE — Progress Notes (Signed)
OFFICE NOTE  07/04/2014  CC:  Chief Complaint  Patient presents with  . Follow-up  . Nausea   HPI: Patient is a 53 y.o. Caucasian male who is here for 1 wk f/u brittle diabetes and postprandial GI upset/nausea that has been consistent lately.  Last visit his K came back high so I increased his lasix to 80 in am and 40 in pm, held aldactone.  His lipase was also mildly elevated (83). His abd u/s the next day showed sludge in GB but was o/w normal.  HIDA scan today was completely normal.  Still with complaint of constant feeling of upper abd bloating/mild upset stomach/nausea that is much worse after he eats. Pain in subxyphoid region.  No radiation of pain to the back or sides or downward.  No vomiting.  His LE edema is much improved.  His wt is down 8 lbs since last visit.  Glucoses: 114 this morning, was 97 just before his appt here today.  It did not get over 137 the whole week. He is on 38 U lantus qhs and 10 U novolog qAC.  Bowels are moving fine: no diarrhea, no constipation.  Pertinent PMH:  Past Medical History  Diagnosis Date  . Heart murmur     ECHO 07/2011 showed mild AS and LVH.  TEE 09/2011 showed small PFO, bicuspid aortic valve but no stenosis or regurg.  . 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  F/u u/s for ongoing/worsening thrombocytopenia showed no more splenomegaly.  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  Gen surg and GI recommended flagyl and cipro x 2 wks..  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  . Cholelithiasis 09/2011; 08/2013    u/s--no cholecystitis  . Alcoholism   . Peripheral neuropathy 2012    Diabetic:  Autonomic (pelvic) and LE polyneuropathy--[facility] testing showed that it is likely from DM  . Diabetic  foot ulcers 09/2012    Poor healing; normal ABIs and TBIs  . Arthritis     back L3 - L5  . Hyperactive gag reflex   . History of pyelonephritis 09/2011  . Type II or unspecified type diabetes mellitus with neurological manifestations, not stated as uncontrolled     IDDM: neuro, renal, and ophth complications  . Wears dentures     upper  . Osteomyelitis of toe of left foot 10/22/12    left 4th and 5th toes--now s/p amputation of these areas  . Diabetic retinopathy associated with type 2 diabetes mellitus     Laser tx in both eyes  . Diabetic nephropathy 09/2013    Proteinuria and CrCl 55-65  . Chronic renal insufficiency, stage III (moderate)   . Lumbar spondylosis     MRI 08/25/11 with multilevel facet dz, small disc protrusion at L5-S1 to the right without impingement   Past Surgical History  Procedure Laterality Date  . Knee arthroscopy  2001    Bilateral  . Amputation  10/24/2012    Procedure: AMPUTATION RAY;  Surgeon: Sherri Rad, MD;  Location: [facility];  Service: Orthopedics;  Laterality: Left;  left 4th toe amputation through MTP joint, 5th Ray amputation   . Knee surgery  2001   MEDS:  Outpatient Prescriptions Prior to Visit  Medication Sig Dispense  Refill  . furosemide (LASIX) 40 MG tablet 1 tab po bid  60 tablet  6  . gabapentin (NEURONTIN) 300 MG capsule Take 300 mg by mouth 2 (two) times daily.      [name] glucagon 1 MG injection Inject 1 mg into the vein once as needed. For hypoglycemia  1 each  2  . glucose blood (FREESTYLE LITE) test strip Use as directed to check blood sugar. DX 250.82  100 each  6  . insulin aspart (NOVOLOG) 100 UNIT/ML injection Inject 14 Units into the skin 3 (three) times daily before meals. Sliding scale in addition if needed  1 vial  6  . insulin glargine (LANTUS) 100 UNIT/ML injection Inject 0.42 mLs (42 Units total) into the skin at bedtime.  10 mL  6  . Insulin Pen Needle (NOVOFINE) 32G X 6 MM MISC USE WITH INSULIN PEN FOR 4  INJECTIONS DAILY  120 each  6  . Lancets (FREESTYLE) lancets PRN  100 each  6  . lisinopril (PRINIVIL,ZESTRIL) 20 MG tablet Take 1 tablet (20 mg total) by mouth daily.  30 tablet  6  . promethazine (PHENERGAN) 25 MG tablet Take 1 tablet (25 mg total) by mouth every 6 (six) hours as needed for nausea or vomiting.  30 tablet  1  . propranolol (INDERAL) 40 MG tablet Take 1 tablet (40 mg total) by mouth 2 (two) times daily.  60 tablet  6  . sildenafil (VIAGRA) 50 MG tablet 1-2 tabs 30 min prior to intercourse  8 tablet  0  . spironolactone (ALDACTONE) 100 MG tablet 1/2 tab po qd  30 tablet  6   No facility-administered medications prior to visit.    PE: Blood pressure 162/96, pulse 66, temperature 98.3 F (36.8 C), temperature source Temporal, resp. rate 18, height 5\' 10"  (1.778 m), weight 235 lb (106.595 kg), SpO2 98.00%. Gen: Alert, well appearing.  Patient is oriented to person, place, time, and situation. ZHY:QMVH: no injection, icteris, swelling, or exudate.  EOMI, PERRLA. Mouth: lips without lesion/swelling.  Oral mucosa pink and moist. Oropharynx without erythema, exudate, or swelling.  CV: RRR, no m/r/g.   LUNGS: CTA bilat, nonlabored resps, good aeration in all lung fields. ABD: soft, rotund but nondistended.  Mild TTP in RUQ but prominent tenderness directly over xyphoid area and just below it in upper abdomen.  No LUQ tenderness.  No lower abd tenderness.  No mass, HSM, or bruit. EXT: no clubbing or cyanosis.  He has trace pitting edema in right LL, 1+ pitting edema in left LL.  IMPRESSION AND PLAN:  1) DM 2, insulin-requiring with history of erratic/brittle control PARTICULARLY WHEN PATIENT WORKS ANYTHING OTHER THAN THE DAY SHIFT/1ST SHIFT AT HIS JOB.   This week his control has been excellent, without highs or lows, and he has been off of work---eating/up in daytime and sleeping at night like he would be doing during dayshift work. I filled out work form today allowing return to  full time work beginning 07/07/14 and put a special request in regarding putting him exclusively on day shift.  However, they have been unable to accommodate him in the past. Continue 38 U lantus at bedtime and 10 U novolog with each meal.   Will ask endocrinologist to see him to see if there is a better regimen for him, ?consideration of insulin pump?--since he will likely continue to have to alternate between day shift and night shift.  2) Abd pain and nausea/bloating, mostly postprandial. I initially  was worried about symptomatic cholelithiasis but u/s showed that previously seen stones were gone and now only sludge was noted and all else normal.  HIDA scan normal today. Unsure why lipase would be up a tiny bit with no stone dz and no alcohol use, but suppose sphincter of ODDI dysfunction could do this. Will repeat lipase along with CMET today and if it is still up then will do abd CT to look at pancreas. Therapeutically, will start treatment for gastritis today: pantoprazole 40mg  qd, ranitidine 150mg  qhs. May ultimately need to either do gastric emptying study or start empiric reglan for diabetic gastroparesis. We'll see how labs look and how he responds to the PPI + H2 blocker.  Check H pylori ab today.  3) Alc cirrhosis with hx of ascites.  Volume status much improved currently. Decrease lasix (furosemide) to 40mg  twice per day. Continue to HOLD aldactone (spironolactone). Rechecking lytes/cr today.  4) Recent hyperkalemia: stopped aldactone for now.  Recheck K today.  An After Visit Summary was printed and given to the patient.  FOLLOW UP: 10-14 days

## 2014-07-07 ENCOUNTER — Telehealth: Payer: Self-pay | Admitting: Family Medicine

## 2014-07-07 NOTE — Telephone Encounter (Signed)
Pt LMOM requesting a CB from Dr. [name].  Pt's work is now requesting him to see their physician regarding pt coming back to work.  Pt requesting CB at [phone].

## 2014-07-08 NOTE — Telephone Encounter (Signed)
Called pt back.  He said he ended up having to see his company's MD in order to determine if release back to work was appropriate.  Apparently he did get released to go back but is waiting on the official call to let him know. In the meantime, he wanted me to be aware in case I got contacted by anyone about this. Also, he said he has been contacted and has set up endocrinologist consult appt.

## 2014-07-18 ENCOUNTER — Encounter (HOSPITAL_COMMUNITY): Payer: Self-pay | Admitting: Emergency Medicine

## 2014-07-18 ENCOUNTER — Inpatient Hospital Stay (HOSPITAL_COMMUNITY)
Admission: EM | Admit: 2014-07-18 | Discharge: 2014-07-24 | DRG: 871 | Disposition: A | Discharge status: Home / Self Care | Payer: 59 | Attending: Internal Medicine | Admitting: Internal Medicine

## 2014-07-18 ENCOUNTER — Telehealth: Payer: Self-pay | Admitting: Family Medicine

## 2014-07-18 ENCOUNTER — Ambulatory Visit (INDEPENDENT_AMBULATORY_CARE_PROVIDER_SITE_OTHER): Payer: 59 | Admitting: Family Medicine

## 2014-07-18 ENCOUNTER — Encounter: Payer: Self-pay | Admitting: Family Medicine

## 2014-07-18 ENCOUNTER — Ambulatory Visit (HOSPITAL_BASED_OUTPATIENT_CLINIC_OR_DEPARTMENT_OTHER)
Admission: RE | Admit: 2014-07-18 | Discharge: 2014-07-18 | Disposition: A | Discharge status: Home / Self Care | Payer: 59 | Source: Ambulatory Visit | Attending: Family Medicine | Admitting: Family Medicine

## 2014-07-18 VITALS — BP 137/69 | HR 76 | Temp 100.9°F | Resp 22 | Ht 70.0 in | Wt 233.0 lb

## 2014-07-18 DIAGNOSIS — F17200 Nicotine dependence, unspecified, uncomplicated: Secondary | ICD-10-CM | POA: Diagnosis present

## 2014-07-18 DIAGNOSIS — E8809 Other disorders of plasma-protein metabolism, not elsewhere classified: Secondary | ICD-10-CM | POA: Diagnosis present

## 2014-07-18 DIAGNOSIS — R0989 Other specified symptoms and signs involving the circulatory and respiratory systems: Secondary | ICD-10-CM

## 2014-07-18 DIAGNOSIS — J189 Pneumonia, unspecified organism: Secondary | ICD-10-CM | POA: Diagnosis present

## 2014-07-18 DIAGNOSIS — E1149 Type 2 diabetes mellitus with other diabetic neurological complication: Secondary | ICD-10-CM | POA: Diagnosis present

## 2014-07-18 DIAGNOSIS — N183 Chronic kidney disease, stage 3 unspecified: Secondary | ICD-10-CM | POA: Diagnosis present

## 2014-07-18 DIAGNOSIS — R748 Abnormal levels of other serum enzymes: Secondary | ICD-10-CM | POA: Diagnosis present

## 2014-07-18 DIAGNOSIS — Z6833 Body mass index (BMI) 33.0-33.9, adult: Secondary | ICD-10-CM | POA: Diagnosis not present

## 2014-07-18 DIAGNOSIS — N058 Unspecified nephritic syndrome with other morphologic changes: Secondary | ICD-10-CM | POA: Diagnosis present

## 2014-07-18 DIAGNOSIS — N182 Chronic kidney disease, stage 2 (mild): Secondary | ICD-10-CM | POA: Diagnosis present

## 2014-07-18 DIAGNOSIS — R7881 Bacteremia: Secondary | ICD-10-CM | POA: Diagnosis present

## 2014-07-18 DIAGNOSIS — Z8719 Personal history of other diseases of the digestive system: Secondary | ICD-10-CM

## 2014-07-18 DIAGNOSIS — D6959 Other secondary thrombocytopenia: Secondary | ICD-10-CM | POA: Diagnosis present

## 2014-07-18 DIAGNOSIS — K859 Acute pancreatitis without necrosis or infection, unspecified: Secondary | ICD-10-CM | POA: Diagnosis present

## 2014-07-18 DIAGNOSIS — K529 Noninfective gastroenteritis and colitis, unspecified: Secondary | ICD-10-CM | POA: Diagnosis present

## 2014-07-18 DIAGNOSIS — I248 Other forms of acute ischemic heart disease: Secondary | ICD-10-CM | POA: Diagnosis present

## 2014-07-18 DIAGNOSIS — K7460 Unspecified cirrhosis of liver: Secondary | ICD-10-CM | POA: Diagnosis present

## 2014-07-18 DIAGNOSIS — I129 Hypertensive chronic kidney disease with stage 1 through stage 4 chronic kidney disease, or unspecified chronic kidney disease: Secondary | ICD-10-CM | POA: Diagnosis present

## 2014-07-18 DIAGNOSIS — E11319 Type 2 diabetes mellitus with unspecified diabetic retinopathy without macular edema: Secondary | ICD-10-CM | POA: Diagnosis present

## 2014-07-18 DIAGNOSIS — R112 Nausea with vomiting, unspecified: Secondary | ICD-10-CM

## 2014-07-18 DIAGNOSIS — Z888 Allergy status to other drugs, medicaments and biological substances status: Secondary | ICD-10-CM | POA: Diagnosis not present

## 2014-07-18 DIAGNOSIS — R161 Splenomegaly, not elsewhere classified: Secondary | ICD-10-CM | POA: Diagnosis present

## 2014-07-18 DIAGNOSIS — R188 Other ascites: Secondary | ICD-10-CM | POA: Diagnosis present

## 2014-07-18 DIAGNOSIS — Q231 Congenital insufficiency of aortic valve: Secondary | ICD-10-CM

## 2014-07-18 DIAGNOSIS — R9431 Abnormal electrocardiogram [ECG] [EKG]: Secondary | ICD-10-CM | POA: Diagnosis present

## 2014-07-18 DIAGNOSIS — K7030 Alcoholic cirrhosis of liver without ascites: Secondary | ICD-10-CM

## 2014-07-18 DIAGNOSIS — E118 Type 2 diabetes mellitus with unspecified complications: Secondary | ICD-10-CM

## 2014-07-18 DIAGNOSIS — Z8249 Family history of ischemic heart disease and other diseases of the circulatory system: Secondary | ICD-10-CM

## 2014-07-18 DIAGNOSIS — A4151 Sepsis due to Escherichia coli [E. coli]: Secondary | ICD-10-CM | POA: Diagnosis present

## 2014-07-18 DIAGNOSIS — E669 Obesity, unspecified: Secondary | ICD-10-CM | POA: Diagnosis present

## 2014-07-18 DIAGNOSIS — IMO0002 Reserved for concepts with insufficient information to code with codable children: Secondary | ICD-10-CM

## 2014-07-18 DIAGNOSIS — J690 Pneumonitis due to inhalation of food and vomit: Secondary | ICD-10-CM | POA: Diagnosis present

## 2014-07-18 DIAGNOSIS — K5289 Other specified noninfective gastroenteritis and colitis: Secondary | ICD-10-CM | POA: Diagnosis present

## 2014-07-18 DIAGNOSIS — R17 Unspecified jaundice: Secondary | ICD-10-CM | POA: Diagnosis present

## 2014-07-18 DIAGNOSIS — E1129 Type 2 diabetes mellitus with other diabetic kidney complication: Secondary | ICD-10-CM | POA: Diagnosis present

## 2014-07-18 DIAGNOSIS — F1021 Alcohol dependence, in remission: Secondary | ICD-10-CM | POA: Diagnosis present

## 2014-07-18 DIAGNOSIS — R778 Other specified abnormalities of plasma proteins: Secondary | ICD-10-CM | POA: Diagnosis present

## 2014-07-18 DIAGNOSIS — K8309 Other cholangitis: Secondary | ICD-10-CM | POA: Diagnosis present

## 2014-07-18 DIAGNOSIS — I2489 Other forms of acute ischemic heart disease: Secondary | ICD-10-CM | POA: Diagnosis present

## 2014-07-18 DIAGNOSIS — I868 Varicose veins of other specified sites: Secondary | ICD-10-CM | POA: Diagnosis present

## 2014-07-18 DIAGNOSIS — N179 Acute kidney failure, unspecified: Secondary | ICD-10-CM | POA: Diagnosis present

## 2014-07-18 DIAGNOSIS — I429 Cardiomyopathy, unspecified: Secondary | ICD-10-CM | POA: Diagnosis present

## 2014-07-18 DIAGNOSIS — Z79899 Other long term (current) drug therapy: Secondary | ICD-10-CM | POA: Diagnosis not present

## 2014-07-18 DIAGNOSIS — K8070 Calculus of gallbladder and bile duct without cholecystitis without obstruction: Secondary | ICD-10-CM | POA: Diagnosis present

## 2014-07-18 DIAGNOSIS — Z794 Long term (current) use of insulin: Secondary | ICD-10-CM | POA: Diagnosis not present

## 2014-07-18 DIAGNOSIS — R109 Unspecified abdominal pain: Secondary | ICD-10-CM

## 2014-07-18 DIAGNOSIS — Z833 Family history of diabetes mellitus: Secondary | ICD-10-CM

## 2014-07-18 DIAGNOSIS — E860 Dehydration: Secondary | ICD-10-CM | POA: Diagnosis present

## 2014-07-18 DIAGNOSIS — G609 Hereditary and idiopathic neuropathy, unspecified: Secondary | ICD-10-CM | POA: Diagnosis present

## 2014-07-18 DIAGNOSIS — R509 Fever, unspecified: Secondary | ICD-10-CM

## 2014-07-18 DIAGNOSIS — E1165 Type 2 diabetes mellitus with hyperglycemia: Secondary | ICD-10-CM | POA: Diagnosis present

## 2014-07-18 DIAGNOSIS — F1010 Alcohol abuse, uncomplicated: Secondary | ICD-10-CM

## 2014-07-18 DIAGNOSIS — K766 Portal hypertension: Secondary | ICD-10-CM | POA: Diagnosis present

## 2014-07-18 DIAGNOSIS — A419 Sepsis, unspecified organism: Secondary | ICD-10-CM | POA: Diagnosis present

## 2014-07-18 DIAGNOSIS — I428 Other cardiomyopathies: Secondary | ICD-10-CM | POA: Diagnosis present

## 2014-07-18 DIAGNOSIS — B9689 Other specified bacterial agents as the cause of diseases classified elsewhere: Secondary | ICD-10-CM

## 2014-07-18 DIAGNOSIS — R7989 Other specified abnormal findings of blood chemistry: Secondary | ICD-10-CM

## 2014-07-18 DIAGNOSIS — I10 Essential (primary) hypertension: Secondary | ICD-10-CM

## 2014-07-18 DIAGNOSIS — E1139 Type 2 diabetes mellitus with other diabetic ophthalmic complication: Secondary | ICD-10-CM | POA: Diagnosis present

## 2014-07-18 DIAGNOSIS — D696 Thrombocytopenia, unspecified: Secondary | ICD-10-CM | POA: Diagnosis present

## 2014-07-18 LAB — COMPREHENSIVE METABOLIC PANEL
ALBUMIN: 3.2 g/dL — AB (ref 3.5–5.2)
ALK PHOS: 113 U/L (ref 39–117)
ALK PHOS: 124 U/L — AB (ref 39–117)
ALT: 87 U/L — ABNORMAL HIGH (ref 0–53)
ALT: 83 U/L — ABNORMAL HIGH (ref 0–53)
ANION GAP: 15 (ref 5–15)
AST: 91 U/L — ABNORMAL HIGH (ref 0–37)
AST: 83 U/L — ABNORMAL HIGH (ref 0–37)
Albumin: 2.8 g/dL — ABNORMAL LOW (ref 3.5–5.2)
BILIRUBIN TOTAL: 7.9 mg/dL — AB (ref 0.2–1.2)
BUN: 54 mg/dL — ABNORMAL HIGH (ref 6–23)
BUN: 56 mg/dL — ABNORMAL HIGH (ref 6–23)
CO2: 24 mEq/L (ref 19–32)
CO2: 21 meq/L (ref 19–32)
Calcium: 8.3 mg/dL — ABNORMAL LOW (ref 8.4–10.5)
Calcium: 8.5 mg/dL (ref 8.4–10.5)
Chloride: 100 mEq/L (ref 96–112)
Chloride: 97 mEq/L (ref 96–112)
Creat: 1.87 mg/dL — ABNORMAL HIGH (ref 0.50–1.35)
Creatinine, Ser: 1.71 mg/dL — ABNORMAL HIGH (ref 0.50–1.35)
GFR, EST AFRICAN AMERICAN: 52 mL/min — AB (ref >90)
GFR, EST NON AFRICAN AMERICAN: 45 mL/min — AB (ref >90)
GLUCOSE: 209 mg/dL — AB (ref 70–99)
Glucose, Bld: 193 mg/dL — ABNORMAL HIGH (ref 70–99)
POTASSIUM: 4.6 meq/L (ref 3.5–5.3)
POTASSIUM: 4.2 meq/L (ref 3.7–5.3)
Sodium: 135 mEq/L (ref 135–145)
Sodium: 133 mEq/L — ABNORMAL LOW (ref 137–147)
Total Bilirubin: 7.4 mg/dL — ABNORMAL HIGH (ref 0.3–1.2)
Total Protein: 5.4 g/dL — ABNORMAL LOW (ref 6.0–8.3)
Total Protein: 5.9 g/dL — ABNORMAL LOW (ref 6.0–8.3)

## 2014-07-18 LAB — CBC WITH DIFFERENTIAL/PLATELET
BASOS ABS: 0 10*3/uL (ref 0.0–0.1)
Basophils Absolute: 0 10*3/uL (ref 0.0–0.1)
Basophils Relative: 0 % (ref 0–1)
Basophils Relative: 0 % (ref 0–1)
EOS ABS: 0 10*3/uL (ref 0.0–0.7)
EOS PCT: 0 % (ref 0–5)
Eosinophils Absolute: 0 10*3/uL (ref 0.0–0.7)
Eosinophils Relative: 0 % (ref 0–5)
HCT: 32.2 % — ABNORMAL LOW (ref 39.0–52.0)
HCT: 33.5 % — ABNORMAL LOW (ref 39.0–52.0)
Hemoglobin: 11.3 g/dL — ABNORMAL LOW (ref 13.0–17.0)
Hemoglobin: 11.2 g/dL — ABNORMAL LOW (ref 13.0–17.0)
LYMPHS ABS: 0.2 10*3/uL — AB (ref 0.7–4.0)
Lymphocytes Relative: 1 % — ABNORMAL LOW (ref 12–46)
Lymphocytes Relative: 2 % — ABNORMAL LOW (ref 12–46)
Lymphs Abs: 0.1 10*3/uL — ABNORMAL LOW (ref 0.7–4.0)
MCH: 29 pg (ref 26.0–34.0)
MCH: 28.4 pg (ref 26.0–34.0)
MCHC: 35.1 g/dL (ref 30.0–36.0)
MCHC: 33.4 g/dL (ref 30.0–36.0)
MCV: 82.8 fL (ref 78.0–100.0)
MCV: 85 fL (ref 78.0–100.0)
Monocytes Absolute: 0.5 10*3/uL (ref 0.1–1.0)
Monocytes Absolute: 0.4 10*3/uL (ref 0.1–1.0)
Monocytes Relative: 5 % (ref 3–12)
Monocytes Relative: 5 % (ref 3–12)
NEUTROS ABS: 10 10*3/uL — AB (ref 1.7–7.7)
NEUTROS PCT: 94 % — AB (ref 43–77)
NEUTROS PCT: 93 % — AB (ref 43–77)
Neutro Abs: 8.1 10*3/uL — ABNORMAL HIGH (ref 1.7–7.7)
PLATELETS: 57 10*3/uL — AB (ref 150–400)
PLATELETS: 52 10*3/uL — AB (ref 150–400)
RBC: 3.89 MIL/uL — ABNORMAL LOW (ref 4.22–5.81)
RBC: 3.94 MIL/uL — AB (ref 4.22–5.81)
RDW: 14.5 % (ref 11.5–15.5)
RDW: 13.8 % (ref 11.5–15.5)
WBC: 10.6 10*3/uL — ABNORMAL HIGH (ref 4.0–10.5)
WBC: 8.7 10*3/uL (ref 4.0–10.5)

## 2014-07-18 LAB — CBG MONITORING, ED: Glucose-Capillary: 208 mg/dL — ABNORMAL HIGH (ref 70–99)

## 2014-07-18 LAB — I-STAT CG4 LACTIC ACID, ED: LACTIC ACID, VENOUS: 1.44 mmol/L (ref 0.5–2.2)

## 2014-07-18 LAB — LIPASE, BLOOD: Lipase: 518 U/L — ABNORMAL HIGH (ref 11–59)

## 2014-07-18 LAB — LIPASE: Lipase: 679 U/L — ABNORMAL HIGH (ref 0–75)

## 2014-07-18 MED ORDER — PIPERACILLIN-TAZOBACTAM 3.375 G IVPB: 3.3750 g | Freq: Once | INTRAVENOUS | Status: DC

## 2014-07-18 MED ORDER — PROMETHAZINE HCL 25 MG RE SUPP: 25.0000 mg | Freq: Four times a day (QID) | RECTAL | Status: DC | PRN

## 2014-07-18 MED ORDER — SODIUM CHLORIDE 0.9 % IV SOLN
INTRAVENOUS | Status: DC
  Administered 2014-07-19: via INTRAVENOUS

## 2014-07-18 MED ORDER — SODIUM CHLORIDE 0.9 % IV BOLUS (SEPSIS)
1000.0000 mL | Freq: Once | INTRAVENOUS | Status: AC
  Administered 2014-07-18: 1000 mL via INTRAVENOUS

## 2014-07-18 MED ORDER — PIPERACILLIN-TAZOBACTAM 3.375 G IVPB 30 MIN
3.3750 g | Freq: Once | INTRAVENOUS | Status: AC
  Administered 2014-07-19: 3.375 g via INTRAVENOUS
  Filled 2014-07-18: qty 50

## 2014-07-18 MED ORDER — ONDANSETRON HCL 4 MG/2ML IJ SOLN
4.0000 mg | Freq: Once | INTRAMUSCULAR | Status: AC
  Administered 2014-07-18: 4 mg via INTRAVENOUS
  Filled 2014-07-18: qty 2

## 2014-07-18 MED ORDER — PROMETHAZINE HCL 25 MG PO TABS: 25.0000 mg | ORAL_TABLET | Freq: Four times a day (QID) | ORAL | Status: DC | PRN

## 2014-07-18 MED ORDER — IBUPROFEN 800 MG PO TABS
800.0000 mg | ORAL_TABLET | Freq: Once | ORAL | Status: AC
  Administered 2014-07-18: 800 mg via ORAL
  Filled 2014-07-18: qty 1

## 2014-07-18 MED ORDER — SODIUM CHLORIDE 0.9 % IV BOLUS (SEPSIS)
1000.0000 mL | Freq: Once | INTRAVENOUS | Status: AC
  Administered 2014-07-18 – 2014-07-19 (×2): 1000 mL via INTRAVENOUS

## 2014-07-18 MED ORDER — FENTANYL CITRATE 0.05 MG/ML IJ SOLN
100.0000 ug | Freq: Once | INTRAMUSCULAR | Status: AC
  Administered 2014-07-18: 100 ug via INTRAVENOUS
  Filled 2014-07-18: qty 2

## 2014-07-18 NOTE — Telephone Encounter (Signed)
Pt is established with Dr. Juanda Chance at [facility]. Pls call her office and arrange an appt with her ASAP for dx of postprandial epigastric pain and nausea.-thx

## 2014-07-18 NOTE — Progress Notes (Signed)
Pre visit review using our clinic review tool, if applicable. No additional management support is needed unless otherwise documented below in the visit note. 

## 2014-07-18 NOTE — ED Provider Notes (Signed)
Medical screening examination/treatment/procedure(s) were performed by non-physician practitioner and as supervising physician I was immediately available for consultation/collaboration.   EKG Interpretation None       Gilda Crease, MD 07/18/14 [phone]

## 2014-07-18 NOTE — ED Provider Notes (Signed)
CSN: [id number]     Arrival date & time 07/18/14  2115 History   First MD Initiated Contact with Patient 07/18/14 2131     Chief Complaint  Patient presents with  . Pancreatitis  . Pneumonia     (Consider location/radiation/quality/duration/timing/severity/associated sxs/prior Treatment) HPI  Patients to the ED as recommended by PCP. He has been having fevers, epigastric pain, and vomiting. He has a hx of pancreatitis, diabetes, cho lithiasis, heart murmur, alcoholic, perforation of large intestine, diabetic foot ulcers. He started hurting 48 hours ago and vomiting. Unable to keep fluids and solids down. Stopped eating and drinking 6 hours ago so unsure if he can still keep it down. He had a chest xray done which showed some atelectasis but no overt pneumonia.   Past Medical History  Diagnosis Date  . Heart murmur     ECHO 07/2011 showed mild AS and LVH.  TEE 09/2011 showed small PFO, bicuspid aortic valve but no stenosis or regurg.  . 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  F/u u/s for ongoing/worsening thrombocytopenia showed no more splenomegaly.  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  Gen surg and GI recommended flagyl and cipro x 2 wks..  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  . Cholelithiasis 09/2011; 08/2013    u/s--no cholecystitis.  06/2014 u/s showed no stones, only GB sludge  . Alcoholism   . Peripheral neuropathy 2012    Diabetic:  Autonomic (pelvic) and LE polyneuropathy--[facility] testing showed that it is likely from DM  . 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 Arthritis     back L3 - L5  . Hyperactive gag reflex   . History of pyelonephritis 09/2011  . Type II or unspecified type diabetes  mellitus with neurological manifestations, not stated as uncontrolled     IDDM: neuro, renal, and ophth complications  . Wears dentures     upper  . Osteomyelitis of toe of left foot 10/22/12    left 4th and 5th toes--now s/p amputation of these areas  . Diabetic retinopathy associated with type 2 diabetes mellitus     Laser tx in both eyes  . Diabetic nephropathy 09/2013    Proteinuria and CrCl 55-65  . Chronic renal insufficiency, stage III (moderate)   . Lumbar spondylosis     MRI 08/25/11 with multilevel facet dz, small disc protrusion at L5-S1 to the right without impingement   Past Surgical History  Procedure Laterality Date  . Knee arthroscopy  2001    Bilateral  . Amputation  10/24/2012    Procedure: AMPUTATION RAY;  Surgeon: Sherri Rad, MD;  Location: [facility];  Service: Orthopedics;  Laterality: Left;  left 4th toe amputation through MTP joint, 5th Ray amputation   . Knee surgery  2001  . Hida scan  06/2014    Normal   Family History  Problem Relation Age of Onset  . Heart disease Father    History  Substance Use Topics  . Smoking status: Never Smoker   . Smokeless tobacco: Current User    Types: Chew  . Alcohol Use: No     Comment: Quit drinking alcohol 08/20/2011    Review of Systems   Review  of Systems  Gen: no weight loss,  chills, night sweats + fevers Eyes: no discharge or drainage, no occular pain or visual changes  Nose: no epistaxis or rhinorrhea  Mouth: no dental pain, no sore throat  Neck: no neck pain  Lungs:No wheezing or hemoptysis No coughing CV:  No palpitations, dependent edema or orthopnea. No chest pain Abd: no diarrhea., + abdominal pain, nausea or vomiting GU: no dysuria or gross hematuria  MSK:  No muscle weakness, No  pain Neuro: no headache, no focal neurologic deficits  Skin: no rash , no wounds Psyche: no complaints    Allergies  Hydralazine hcl; Naproxen; and Other  Home Medications   Prior to Admission  medications   Medication Sig Start Date End Date Taking? Authorizing Provider  furosemide (LASIX) 40 MG tablet Take 40 mg by mouth 2 (two) times daily.   Yes Historical Provider, MD  glucagon 1 MG injection Inject 1 mg into the vein once as needed. For hypoglycemia 02/25/14  Yes Jeoffrey MassedPhilip H McGowen, MD  insulin aspart (NOVOLOG) 100 UNIT/ML injection Inject 10 Units into the skin 3 (three) times daily before meals.   Yes Historical Provider, MD  insulin glargine (LANTUS) 100 UNIT/ML injection Inject 0.42 mLs (42 Units total) into the skin at bedtime. 08/23/13  Yes Jeoffrey MassedPhilip H McGowen, MD  lisinopril (PRINIVIL,ZESTRIL) 20 MG tablet Take 1 tablet (20 mg total) by mouth daily. 12/25/13  Yes Jeoffrey MassedPhilip H McGowen, MD  pantoprazole (PROTONIX) 40 MG tablet Take 1 tablet (40 mg total) by mouth daily. 07/04/14  Yes Jeoffrey MassedPhilip H McGowen, MD  promethazine (PHENERGAN) 25 MG tablet Take 1 tablet (25 mg total) by mouth every 6 (six) hours as needed for nausea or vomiting. 07/18/14  Yes Jeoffrey MassedPhilip H McGowen, MD  ranitidine (ZANTAC) 150 MG tablet Take 1 tablet (150 mg total) by mouth at bedtime. 07/04/14  Yes Jeoffrey MassedPhilip H McGowen, MD  promethazine (PHENERGAN) 25 MG suppository Place 1 suppository (25 mg total) rectally every 6 (six) hours as needed for nausea or vomiting. 07/18/14   Jeoffrey MassedPhilip H McGowen, MD   BP 122/52  Pulse 86  Temp(Src) 99.7 F (37.6 C) (Oral)  Resp 28  SpO2 95% Physical Exam  Nursing note and vitals reviewed. Constitutional: He appears well-developed and well-nourished. No distress.  HENT:  Head: Normocephalic and atraumatic.  Eyes: Pupils are equal, round, and reactive to light.  Neck: Normal range of motion. Neck supple.  Cardiovascular: Normal rate and regular rhythm.   Pulmonary/Chest: Effort normal and breath sounds normal.  Abdominal: Soft. Bowel sounds are normal. He exhibits no distension. There is no hepatosplenomegaly. There is tenderness in the epigastric area. There is no rigidity, no guarding and no CVA  tenderness.    Neurological: He is alert.  Skin: Skin is warm. He is diaphoretic (and flushed).    ED Course  Procedures (including critical care time) Labs Review Labs Reviewed  COMPREHENSIVE METABOLIC PANEL - Abnormal; Notable for the following:    Sodium 133 (*)    Glucose, Bld 209 (*)    BUN 56 (*)    Creatinine, Ser 1.71 (*)    Total Protein 5.9 (*)    Albumin 2.8 (*)    AST 83 (*)    ALT 83 (*)    Alkaline Phosphatase 124 (*)    Total Bilirubin 7.4 (*)    GFR calc non Af Amer 45 (*)    GFR calc Af Amer 52 (*)    All other components within normal limits  LIPASE, BLOOD - Abnormal; Notable for the following:    Lipase 518 (*)    All other components within normal limits  CBC WITH DIFFERENTIAL - Abnormal; Notable for the following:    RBC 3.94 (*)    Hemoglobin 11.2 (*)    HCT 33.5 (*)    Platelets 52 (*)    Neutrophils Relative % 93 (*)    Lymphocytes Relative 2 (*)    Neutro Abs 8.1 (*)    Lymphs Abs 0.2 (*)    All other components within normal limits  CBG MONITORING, ED - Abnormal; Notable for the following:    Glucose-Capillary 208 (*)    All other components within normal limits  PROTIME-INR  I-STAT CG4 LACTIC ACID, ED    Imaging Review Dg Chest 2 View  07/18/2014   CLINICAL DATA:  Vomiting, short of breath and chest pain  EXAM: CHEST  2 VIEW  COMPARISON:  Prior chest x-ray 02/19/2013  FINDINGS: Interval development of lingular opacity obscuring into the cardiac apex. No definite pleural effusion visible on the lateral view. Cardiac and mediastinal contours are unchanged. The right lung remains well aerated. No acute osseous abnormality. Degenerative change in the bilateral acromioclavicular joints.  IMPRESSION: New opacity in the lingula obscuring the left cardiac apex may reflect atelectasis or infiltrate.   Electronically Signed   By: Malachy Moan M.D.   On: 07/18/2014 17:50     EKG Interpretation None      MDM   Final diagnoses:  Acute  pancreatitis, unspecified pancreatitis type  Aspiration pneumonia, unspecified aspiration pneumonia type   Study Result    CLINICAL DATA: Vomiting, short of breath and chest pain  EXAM:  CHEST 2 VIEW  COMPARISON: Prior chest x-ray 02/19/2013  FINDINGS:  Interval development of lingular opacity obscuring into the cardiac  apex. No definite pleural effusion visible on the lateral view.  Cardiac and mediastinal contours are unchanged. The right lung  remains well aerated. No acute osseous abnormality. Degenerative  change in the bilateral acromioclavicular joints.  IMPRESSION:  New opacity in the lingula obscuring the left cardiac apex may  reflect atelectasis or infiltrate.  Electronically Signed  By: Malachy Moan M.D.  On: 07/18/2014 17:50     Patient given: Medications  sodium chloride 0.9 % bolus 1,000 mL (1,000 mLs Intravenous New Bag/Given 07/18/14 2314)  piperacillin-tazobactam (ZOSYN) IVPB 3.375 g (not administered)  0.9 %  sodium chloride infusion (not administered)  ibuprofen (ADVIL,MOTRIN) tablet 800 mg (800 mg Oral Given 07/18/14 2208)  sodium chloride 0.9 % bolus 1,000 mL (0 mLs Intravenous Stopped 07/18/14 2314)  fentaNYL (SUBLIMAZE) injection 100 mcg (100 mcg Intravenous Given 07/18/14 2303)  ondansetron (ZOFRAN) injection 4 mg (4 mg Intravenous Given 07/18/14 2304)    His temperature responded well to Motrin. I spoke with admitting physician Dr. Valentino Saxon who is concerned that because of his vomiting he may have aspiration pneumonia and at this time we will treat with Zosyn. The patients pain has been controlled as well as his nausea.   Filed Vitals:   07/18/14 2306  BP:   Pulse:   Temp: 99.7 F (37.6 C)  Resp:     Inpatient, [facility],Team 8, triad, Tele    Dorthula Matas, PA-C 07/18/14 2337

## 2014-07-18 NOTE — ED Notes (Signed)
Pt was sent here for further evaluation of his pancreatitis and known pneumonia

## 2014-07-18 NOTE — Progress Notes (Signed)
OFFICE VISIT  07/18/2014   CC:  Chief Complaint  Patient presents with  . Follow-up   HPI:    Patient is a 53 y.o. Caucasian male who presents for 2 wk f/u brittle DM 2 and recent persistent problems with postprandial GI upset for which our w/u has not uncovered a cause thus far (abd u/s and HIDA scan).  Yesterday he had onset of nausea and nonbilious vomiting after getting home from work about 3-4 oclock in the afternoon.  He had onset of severe mid epigastric pain at the same time and this has persisted but is 1/2 as intense today.  The pain is also in RUQ and LUQ and does radiate through to his back.  Temp 101 last night.  No diarrhea but has had 2 normal BMs and is passing gas.  Severe orbital/periorbital HA started yesterday as well.   Urine looks "like tea" today.  No runny nose but a bit of nasal congestion since vomiting a lot.  No cough, no SOB.  No ST. Feels very tired.  +orthostatic lightheadedness.    He remains on 40mg  lasix daily and is still holding his aldactone due to recent hx of hyperkalemia--however he couldn't keep meds down today. Last insulin dose was mealtime insulin yesterday--10 units at about 9 o'clock.  However, he has not been able to keep anything solid or any substantial liquids down.  He does say he has been able to keep small sips of clear fluids down all day today.  Gluc middle of night 54, drank juice and kept it down.  Glucose recheck this morning was 78.  He took no insulin today.  Past Medical History  Diagnosis Date  . Heart murmur     ECHO 07/2011 showed mild AS and LVH.  TEE 09/2011 showed small PFO, bicuspid aortic valve but no stenosis or regurg.  . 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  F/u u/s for ongoing/worsening thrombocytopenia showed no more splenomegaly.  [name]. 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  Gen surg and GI recommended flagyl and cipro x 2 wks..  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  . Cholelithiasis 09/2011; 08/2013    u/s--no cholecystitis.  06/2014 u/s showed no stones, only GB sludge  . Alcoholism   . Peripheral neuropathy 2012    Diabetic:  Autonomic (pelvic) and LE polyneuropathy--[facility] testing showed that it is likely from DM  . 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 Arthritis     back L3 - L5  . Hyperactive gag reflex   . History of pyelonephritis 09/2011  . Type II or unspecified type diabetes mellitus with neurological manifestations, not stated as uncontrolled     IDDM: neuro, renal, and ophth complications  . Wears dentures     upper  . Osteomyelitis of toe of left foot 10/22/12    left 4th and 5th toes--now s/p amputation of these areas  . Diabetic retinopathy associated with type 2 diabetes mellitus     Laser tx in both eyes  . Diabetic nephropathy 09/2013    Proteinuria and CrCl 55-65  . Chronic renal insufficiency, stage III (moderate)   . Lumbar spondylosis     MRI 08/25/11 with multilevel facet  dz, small disc protrusion at L5-S1 to the right without impingement    Past Surgical History  Procedure Laterality Date  . Knee arthroscopy  2001    Bilateral  . Amputation  10/24/2012    Procedure: AMPUTATION RAY;  Surgeon: Sherri Rad, MD;  Location: [facility];  Service: Orthopedics;  Laterality: Left;  left 4th toe amputation through MTP joint, 5th Ray amputation   . Knee surgery  2001  . Hida scan  06/2014    Normal   MEDS: he has not taken anything for fever Outpatient Prescriptions Prior to Visit  Medication Sig Dispense Refill  . furosemide (LASIX) 40 MG tablet 1 tab po bid  60 tablet  6  . gabapentin (NEURONTIN) 300 MG capsule Take 300 mg by mouth 2 (two) times daily.      [name] glucose blood (FREESTYLE  LITE) test strip Use as directed to check blood sugar. DX 250.82  100 each  6  . insulin aspart (NOVOLOG) 100 UNIT/ML injection Inject 14 Units into the skin 3 (three) times daily before meals. Sliding scale in addition if needed  1 vial  6  . insulin glargine (LANTUS) 100 UNIT/ML injection Inject 0.42 mLs (42 Units total) into the skin at bedtime.  10 mL  6  . Insulin Pen Needle (NOVOFINE) 32G X 6 MM MISC USE WITH INSULIN PEN FOR 4 INJECTIONS DAILY  120 each  6  . Lancets (FREESTYLE) lancets PRN  100 each  6  . lisinopril (PRINIVIL,ZESTRIL) 20 MG tablet Take 1 tablet (20 mg total) by mouth daily.  30 tablet  6  . pantoprazole (PROTONIX) 40 MG tablet Take 1 tablet (40 mg total) by mouth daily.  30 tablet  3  . promethazine (PHENERGAN) 25 MG tablet Take 1 tablet (25 mg total) by mouth every 6 (six) hours as needed for nausea or vomiting.  30 tablet  1  . propranolol (INDERAL) 40 MG tablet Take 1 tablet (40 mg total) by mouth 2 (two) times daily.  60 tablet  6  . ranitidine (ZANTAC) 150 MG tablet Take 1 tablet (150 mg total) by mouth at bedtime.  30 tablet  3  . sildenafil (VIAGRA) 50 MG tablet 1-2 tabs 30 min prior to intercourse  8 tablet  0  . spironolactone (ALDACTONE) 100 MG tablet 1/2 tab po qd  30 tablet  6  . glucagon 1 MG injection Inject 1 mg into the vein once as needed. For hypoglycemia  1 each  2   No facility-administered medications prior to visit.    Allergies  Allergen Reactions  . Hydralazine Hcl Hives  . Naproxen Nausea And Vomiting  . Other Itching and Other (See Comments)    PERFUMES - SNEEZING    ROS As per HPI  PE: Blood pressure 137/69, pulse 76, temperature 100.9 F (38.3 C), temperature source Temporal, resp. rate 22, height 5\' 10"  (1.778 m), weight 233 lb (105.688 kg), SpO2 94.00%. Gen: Alert, well appearing.  Patient is oriented to person, place, time, and situation. AFFECT: pleasant, lucid thought and speech. ENT: Ears: EACs clear, normal epithelium.  TMs  with good light reflex and landmarks bilaterally.  Eyes: no injection, icteris, swelling, or exudate.  EOMI, PERRLA. Nose: no drainage or turbinate edema/swelling.  No injection or focal lesion.  Mouth: lips without lesion/swelling.  Oral mucosa pink and moist.  Dentition intact and without obvious caries or gingival swelling.  Oropharynx without erythema, exudate, or swelling.  CV: RRR, soft  systolic flow murmur LUNGS: decreased BS and soft insp crackles in both bases, no e-a changes.  Nonlabored resps. ABD: soft, rotund but not distended.  Very TTP in subxyphoid region of abdomen, mod TTP in RUQ and mild TTP in LUQ. No mass, no HSM, no bruit.  BS hypoactive throughout entire abdomen.  Mid and lower abd regions are nontender. He has no rebound tenderness.  He has some flank dullness to abd percussion.   EXT: 1+ pitting edema in both LLs. SKIN: no jaundice  LABS:  None today  IMPRESSION AND PLAN:  1) Fever, upper abd pain, and nausea with non-bilious vomiting.   Abd exam not worrisome for SBO or peritonitis or acute abdomen. He does have signs of significant gastritis and possibly is having another bout of pancreatitis. Will check CBC, CMET, lipase stat.  No abd imaging at this time unless a lab result dictates othewise. We've been working on this problem (minus the fever) over the last several weeks: first evaluated for biliary tract dz (abd u/s with GB sludge, no stones or cholecystitis.  HIDA scan normal), and I feel like we need to have his GI MD see him to determine if he needs EGD or possibly just an upper GI barium study, perhaps a gastric emptying scan.  Will have appt arranged ASAP with Dr. Juanda Chance.   For now, rx'd phenergan 25mg  pills and suppositories.  Continue to push clear liquid diet, only advance if he has tolerated this well for the rest of today/tonight.  2) Fever, abnormal lung sounds: will arrange CXR to be done ASAP---he may have aspirated when he was vomiting.  Yesterday.  3) Mild dehydration: secondary to #1 above.  Push fluids as discussed, rest.  4) DM 2, brittle.  He has endo referral arranged. While ill, he knows to err on having high sugar b/c of his tendency to have hypoglycemia. He'll adjust insulin dosing accordingly.  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 No significant ascites at this time or recently.  Hold diuretics while dehydrated/vomiting. Continue to hold aldactone that was previously held due to recent hyperkalemia.  An After Visit Summary was printed and given to the patient.  FOLLOW UP:  To be determined based on results of pending workup.  If not feeling any better over the weekend he is to call Monday for office f/u---note given to excuse him from work today and, if necessary, this Sunday and Monday.

## 2014-07-18 NOTE — H&P (Signed)
PCP: [name], MD  [facility]  Chief Complaint:   nausea and vomiting, abnormal labs and CXR  HPI: [name] is a 53 y.o. 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   Presented with  07/17/14 patient started to have nausea and vomiting and severe epigastric pain. Patient is alcoholic in remission. Last EtOH intake 2012. He has been diagnosed with cirrhosis since 2012 and have been followed for this by his PCP.  Patient denies any cough.  He had fever for the past 24 hours. He went to his PCP and was diagnosed with PNA and pancreatitis based blood work and CXR and was sent to ER. IN ER he was found to have elevated Cr. 1.7, lipase 500's and elevated bilirubin up to 7.4. From normal 2 weeks ago. Family states his sugars have been dropping into 50' s lately. He has lost some weight.  Of note patient has hx of thrombocytopenia that was thought to be possible due to cirrhosis.   Patient have had a case of pancreatitis in the past. 2 weeks ago he had some abdominal pain and had a HIDA scan ordered showing no evidence of cystic duct obstruction. [place] from 06/26/14 showed biliary sludge but no stones no evidence of cholecystitis at that time.     Hospitalist was called for admission for pancreatitis and aspiration PNA  Review of Systems:    Pertinent positives include: Fevers, chills abdominal pain,  nausea, vomiting,   Constitutional:  No weight loss, night sweats,  fatigue, weight loss  HEENT:  No headaches, Difficulty swallowing,Tooth/dental problems,Sore throat,  No sneezing, itching, ear ache, nasal congestion, post nasal drip,  Cardio-vascular:  No chest pain, Orthopnea, PND, anasarca, dizziness, palpitations.no Bilateral lower extremity swelling  GI:  No heartburn, indigestion,diarrhea, change in bowel habits, loss of appetite, melena, blood in stool, hematemesis Resp:  no shortness of breath at rest. No dyspnea on exertion, No excess mucus, no productive cough, No non-productive cough, No coughing up of blood.No change in color of mucus.No wheezing. Skin:  no rash or lesions. No jaundice GU:  no dysuria, change in color of urine, no urgency or frequency. No straining to urinate.  No flank pain.  Musculoskeletal:  No joint pain or no joint swelling. No decreased range of motion. No back pain.  Psych:  No change in mood or affect. No depression or anxiety. No memory loss.  Neuro: no localizing neurological complaints, no tingling, no weakness, no double vision, no gait abnormality, no slurred speech, no confusion  Otherwise ROS are negative except for above, 10 systems were reviewed  Past Medical History: Past Medical History  Diagnosis Date  . Heart murmur     ECHO 07/2011 showed mild AS and LVH.  TEE 09/2011 showed small PFO, bicuspid aortic valve but no stenosis or regurg.  . 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  F/u u/s for ongoing/worsening thrombocytopenia showed no more splenomegaly.  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  Gen surg and GI recommended flagyl and cipro x 2 wks..  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   . Cholelithiasis 09/2011; 08/2013    u/s--no cholecystitis.  06/2014 u/s showed no stones, only GB sludge  . Alcoholism   . Peripheral neuropathy 2012    Diabetic:  Autonomic (pelvic) and LE polyneuropathy--[facility] testing showed that it is likely from DM  . 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 Arthritis     back L3 - L5  . Hyperactive gag reflex   . History of pyelonephritis 09/2011  . Type II or unspecified type diabetes mellitus with neurological manifestations, not stated as uncontrolled     IDDM: neuro, renal, and ophth complications  . Wears dentures     upper  . Osteomyelitis of toe of left foot 10/22/12    left 4th and 5th toes--now s/p amputation of these areas  . Diabetic retinopathy associated with type 2 diabetes mellitus     Laser tx in both eyes  . Diabetic nephropathy 09/2013    Proteinuria and CrCl 55-65  . Chronic renal insufficiency, stage III (moderate)   . Lumbar spondylosis     MRI 08/25/11 with multilevel facet dz, small disc protrusion at L5-S1 to the right without impingement   Past Surgical History  Procedure Laterality Date  . Knee arthroscopy  2001    Bilateral  . Amputation  10/24/2012    Procedure: AMPUTATION RAY;  Surgeon: Sherri Rad, MD;  Location: [facility];  Service: Orthopedics;  Laterality: Left;  left 4th toe amputation through MTP joint, 5th Ray amputation   . Knee surgery  2001  . Hida scan  06/2014    Normal     Medications: Prior to Admission medications   Medication Sig Start Date End Date Taking? Authorizing Provider  furosemide (LASIX) 40 MG tablet Take 40 mg by mouth 2 (two) times daily.   Yes Historical Provider, MD  glucagon 1 MG injection Inject 1 mg into the vein once as needed. For hypoglycemia 02/25/14  Yes [name], MD  insulin aspart (NOVOLOG) 100 UNIT/ML injection Inject 10 Units into the skin 3 (three) times daily before meals.   Yes Historical Provider, MD  insulin  glargine (LANTUS) 100 UNIT/ML injection Inject 38 Units into the skin at bedtime. 08/23/13  Yes [name], MD  lisinopril (PRINIVIL,ZESTRIL) 20 MG tablet Take 1 tablet (20 mg total) by mouth daily. 12/25/13  Yes [name], MD  pantoprazole (PROTONIX) 40 MG tablet Take 1 tablet (40 mg total) by mouth daily. 07/04/14  Yes [name], MD  promethazine (PHENERGAN) 25 MG tablet Take 1 tablet (25 mg total) by mouth every 6 (six) hours as needed for nausea or vomiting. 07/18/14  Yes [name], MD  ranitidine (ZANTAC) 150 MG tablet Take 1 tablet (150 mg total) by mouth at bedtime. 07/04/14  Yes [name], MD  promethazine (PHENERGAN) 25 MG suppository Place 1 suppository (25 mg total) rectally every 6 (six) hours as needed for nausea or vomiting. 07/18/14   [name], MD    Allergies:   Allergies  Allergen Reactions  . Hydralazine Hcl Hives  . Naproxen Nausea And Vomiting  . Other Itching and Other (See Comments)    PERFUMES -  SNEEZING    Social History:  Ambulatory  independently   Lives at home   With family     reports that he has never smoked. His smokeless tobacco use includes Chew. He reports that he does not drink alcohol or use illicit drugs.    Family History: family history includes Diabetes type II in his sister; Heart disease in his father.    Physical Exam: Patient Vitals for the past 24 hrs:  BP Temp Temp src Pulse Resp SpO2  07/18/14 2306 - 99.7 F (37.6 C) - - - -  07/18/14 2305 122/52 mmHg - - - - -  07/18/14 2305 - - - 86 28 95 %  07/18/14 2148 155/65 mmHg 103 F (39.4 C) Oral 95 26 92 %  07/18/14 2119 151/66 mmHg 102 F (38.9 C) Oral 95 16 96 %    1. General:  in No Acute distress 2. Psychological: Alert and Oriented 3. Head/ENT:   Dry Mucous Membranes                          Head Non traumatic, neck supple                            Poor Dentition 4. SKIN:  decreased Skin turgor,  Skin clean Dry and intact no rash,  ictreric 5. Heart: Regular rate and rhythm no Murmur, Rub or gallop 6. Lungs: no wheezes occasional crackles   7. Abdomen: Soft, some mild epigastric tenderness, Non distended 8. Lower extremities: no clubbing, cyanosis, or edema 9. Neurologically Grossly intact, moving all 4 extremities equally, no asterixsis 10. MSK: Normal range of motion  body mass index is unknown because there is no weight on file.   Labs on Admission:   Recent Labs  07/18/14 1647 07/18/14 2142  NA 135 133*  K 4.6 4.2  CL 100 97  CO2 24 21  GLUCOSE 193* 209*  BUN 54* 56*  CREATININE 1.87* 1.71*  CALCIUM 8.3* 8.5    Recent Labs  07/18/14 1647 07/18/14 2142  AST 91* 83*  ALT 87* 83*  ALKPHOS 113 124*  BILITOT 7.9* 7.4*  PROT 5.4* 5.9*  ALBUMIN 3.2* 2.8*    Recent Labs  07/18/14 1647 07/18/14 2142  LIPASE 679* 518*    Recent Labs  07/18/14 1647 07/18/14 2142  WBC 10.6* 8.7  NEUTROABS 10.0* 8.1*  HGB 11.3* 11.2*  HCT 32.2* 33.5*  MCV 82.8 85.0  PLT 57* 52*   No results found for this basename: CKTOTAL, CKMB, CKMBINDEX, TROPONINI,  in the last 72 hours No results found for this basename: TSH, T4TOTAL, FREET3, T3FREE, THYROIDAB,  in the last 72 hours No results found for this basename: VITAMINB12, FOLATE, FERRITIN, TIBC, IRON, RETICCTPCT,  in the last 72 hours Lab Results  Component Value Date   HGBA1C 7.4* 06/26/2014    The CrCl is unknown because both a height and weight (above a minimum accepted value) are required for this calculation. ABG    Component Value Date/Time   TCO2 23 10/24/2012 1541     Lab Results  Component Value Date   DDIMER 0.40 09/10/2012     Other results:  BNP (last 3 results) No results found for this basename: PROBNP,  in the last 8760 hours  There were no vitals filed for this visit.   Cultures:    Component Value Date/Time   SDES BLOOD LEFT HAND  12/19/2012 0205   SPECREQUEST BOTTLES DRAWN AEROBIC AND ANAEROBIC 5cc 12/19/2012 0205   CULT NO  GROWTH 5 DAYS 12/19/2012 0205   REPTSTATUS 12/25/2012 FINAL 12/19/2012 0205   Radiological Exams on Admission: Dg Chest 2 View  07/18/2014   CLINICAL DATA:  Vomiting, short of breath and chest pain  EXAM: CHEST  2 VIEW  COMPARISON:  Prior chest x-ray 02/19/2013  FINDINGS: Interval development of lingular opacity obscuring into the cardiac apex. No definite pleural effusion visible on the lateral view. Cardiac and mediastinal contours are unchanged. The right lung remains well aerated. No acute osseous abnormality. Degenerative change in the bilateral acromioclavicular joints.  IMPRESSION: New opacity in the lingula obscuring the left cardiac apex may reflect atelectasis or infiltrate.   Electronically Signed   By: Malachy Moan M.D.   On: 07/18/2014 17:50    Chart has been reviewed  Assessment/Plan  53 yo M w hx of mild Alcohol abuse i the past , and hx of well compensated cirrhosis until now presents with acute worsening of LFT's, fever and elevated lipase possible gallstone pancreatitis but no cholecystis on [place], no cholelethiasis. CXR worrisome for mild infiltrate.   Present on Admission:  Sepsis - likely due to GI source cover with Zosyn, aspiration PNA is also a possibility, SU did not show evidence of cholecystitis, CT scan of abdomen currently pending. Blood pressure improved with IVF . Diabetes mellitus type 2, uncontrolled, with complications - SSI . Chronic renal insufficiency, stage II (mild) - Cr up from baseline, give IVF, obtain urine electrolytes . Cirrhosis - chronic, though to be alcoholism induced, but mild etoH hx 2 beers/day per patient will obtain hepatitis panel. Would likely benefit from follow up with GI.  [name] Alcohol abuse in remission - stable no etOH since 2012 . Aspiration pneumonia - possible infiltrate on CXR, cover with zosyn . Pancreatitis - NPO, GI consult eval for gallstone induced pancreatitis if Ct is worrisome. Stop lisinopril, check lipid panel in AM  .  Thrombocytopenia- chronic unchanged from prior. Continue to follow, no indication for transfusion at this time . Dehydration - IVF . HTN (hypertension), benign - hold lisinopril as this can contribute to pancreatitis  Prophylaxis: SCD , Protonix  CODE STATUS:  FULL CODE    Other plan as per orders.  I have spent a total of 65 min on this admission extra time was taken to discuss care with radiology and to review studies  Lanee Chain 07/18/2014, 11:43 PM  [facility]  Pager [phone]   If 7AM-7PM, please contact the day team taking care of the patient  [email or web address]  Password [email or web address]

## 2014-07-19 ENCOUNTER — Encounter (HOSPITAL_COMMUNITY): Payer: Self-pay | Admitting: *Deleted

## 2014-07-19 ENCOUNTER — Inpatient Hospital Stay (HOSPITAL_COMMUNITY): Payer: 59

## 2014-07-19 DIAGNOSIS — R778 Other specified abnormalities of plasma proteins: Secondary | ICD-10-CM | POA: Diagnosis present

## 2014-07-19 DIAGNOSIS — R9431 Abnormal electrocardiogram [ECG] [EKG]: Secondary | ICD-10-CM

## 2014-07-19 DIAGNOSIS — I429 Cardiomyopathy, unspecified: Secondary | ICD-10-CM | POA: Diagnosis present

## 2014-07-19 DIAGNOSIS — R188 Other ascites: Secondary | ICD-10-CM

## 2014-07-19 DIAGNOSIS — R7989 Other specified abnormal findings of blood chemistry: Secondary | ICD-10-CM

## 2014-07-19 DIAGNOSIS — A419 Sepsis, unspecified organism: Secondary | ICD-10-CM | POA: Diagnosis present

## 2014-07-19 DIAGNOSIS — K859 Acute pancreatitis without necrosis or infection, unspecified: Secondary | ICD-10-CM

## 2014-07-19 DIAGNOSIS — Q231 Congenital insufficiency of aortic valve: Secondary | ICD-10-CM

## 2014-07-19 DIAGNOSIS — I428 Other cardiomyopathies: Secondary | ICD-10-CM

## 2014-07-19 DIAGNOSIS — K7460 Unspecified cirrhosis of liver: Secondary | ICD-10-CM

## 2014-07-19 DIAGNOSIS — I319 Disease of pericardium, unspecified: Secondary | ICD-10-CM

## 2014-07-19 HISTORY — PX: CARDIOVASCULAR STRESS TEST: SHX262

## 2014-07-19 LAB — AMMONIA: Ammonia: 37 umol/L (ref 11–60)

## 2014-07-19 LAB — LIPID PANEL
Cholesterol: 135 mg/dL (ref 0–200)
HDL: 18 mg/dL — ABNORMAL LOW (ref >39)
LDL Cholesterol: 91 mg/dL (ref 0–99)
TRIGLYCERIDES: 131 mg/dL (ref <150)
Total CHOL/HDL Ratio: 7.5 RATIO
VLDL: 26 mg/dL (ref 0–40)

## 2014-07-19 LAB — URINALYSIS, ROUTINE W REFLEX MICROSCOPIC
Glucose, UA: 100 mg/dL — AB
KETONES UR: NEGATIVE mg/dL
Nitrite: NEGATIVE
PH: 5 (ref 5.0–8.0)
Protein, ur: >300 mg/dL — AB
Specific Gravity, Urine: 1.02 (ref 1.005–1.030)
Urobilinogen, UA: 1 mg/dL (ref 0.0–1.0)

## 2014-07-19 LAB — COMPREHENSIVE METABOLIC PANEL
ALBUMIN: 2.4 g/dL — AB (ref 3.5–5.2)
ALT: 66 U/L — AB (ref 0–53)
AST: 61 U/L — ABNORMAL HIGH (ref 0–37)
Alkaline Phosphatase: 105 U/L (ref 39–117)
Anion gap: 14 (ref 5–15)
BUN: 59 mg/dL — ABNORMAL HIGH (ref 6–23)
CO2: 21 mEq/L (ref 19–32)
Calcium: 7.9 mg/dL — ABNORMAL LOW (ref 8.4–10.5)
Chloride: 100 mEq/L (ref 96–112)
Creatinine, Ser: 1.92 mg/dL — ABNORMAL HIGH (ref 0.50–1.35)
GFR calc Af Amer: 45 mL/min — ABNORMAL LOW (ref >90)
GFR calc non Af Amer: 39 mL/min — ABNORMAL LOW (ref >90)
Glucose, Bld: 200 mg/dL — ABNORMAL HIGH (ref 70–99)
Potassium: 3.8 mEq/L (ref 3.7–5.3)
SODIUM: 135 meq/L — AB (ref 137–147)
TOTAL PROTEIN: 5.3 g/dL — AB (ref 6.0–8.3)
Total Bilirubin: 7 mg/dL — ABNORMAL HIGH (ref 0.3–1.2)

## 2014-07-19 LAB — TROPONIN I
TROPONIN I: 2.26 ng/mL — AB (ref <0.30)
Troponin I: 2.19 ng/mL (ref <0.30)
Troponin I: 2.11 ng/mL (ref <0.30)

## 2014-07-19 LAB — GLUCOSE, CAPILLARY
GLUCOSE-CAPILLARY: 202 mg/dL — AB (ref 70–99)
GLUCOSE-CAPILLARY: 152 mg/dL — AB (ref 70–99)
GLUCOSE-CAPILLARY: 170 mg/dL — AB (ref 70–99)
Glucose-Capillary: 198 mg/dL — ABNORMAL HIGH (ref 70–99)
Glucose-Capillary: 154 mg/dL — ABNORMAL HIGH (ref 70–99)
Glucose-Capillary: 183 mg/dL — ABNORMAL HIGH (ref 70–99)

## 2014-07-19 LAB — CBC
HCT: 31.2 % — ABNORMAL LOW (ref 39.0–52.0)
HEMOGLOBIN: 10.4 g/dL — AB (ref 13.0–17.0)
MCH: 28.6 pg (ref 26.0–34.0)
MCHC: 33.3 g/dL (ref 30.0–36.0)
MCV: 85.7 fL (ref 78.0–100.0)
Platelets: 49 10*3/uL — ABNORMAL LOW (ref 150–400)
RBC: 3.64 MIL/uL — AB (ref 4.22–5.81)
RDW: 14 % (ref 11.5–15.5)
WBC: 5.9 10*3/uL (ref 4.0–10.5)

## 2014-07-19 LAB — HIV ANTIBODY (ROUTINE TESTING W REFLEX): HIV 1&2 Ab, 4th Generation: NONREACTIVE

## 2014-07-19 LAB — HEPATITIS PANEL, ACUTE
HCV Ab: NEGATIVE
HEP A IGM: NONREACTIVE
Hep B C IgM: NONREACTIVE
Hepatitis B Surface Ag: NEGATIVE

## 2014-07-19 LAB — AFP TUMOR MARKER: AFP-Tumor Marker: <1.3 ng/mL (ref 0.0–8.0)

## 2014-07-19 LAB — PHOSPHORUS: PHOSPHORUS: 3.6 mg/dL (ref 2.3–4.6)

## 2014-07-19 LAB — URINE MICROSCOPIC-ADD ON

## 2014-07-19 LAB — TYPE AND SCREEN
ABO/RH(D): B POS
Antibody Screen: NEGATIVE

## 2014-07-19 LAB — HEPATIC FUNCTION PANEL
ALT: 59 U/L — AB (ref 0–53)
AST: 55 U/L — AB (ref 0–37)
Albumin: 2.3 g/dL — ABNORMAL LOW (ref 3.5–5.2)
Alkaline Phosphatase: 101 U/L (ref 39–117)
BILIRUBIN TOTAL: 7.3 mg/dL — AB (ref 0.3–1.2)
Bilirubin, Direct: 6.3 mg/dL — ABNORMAL HIGH (ref 0.0–0.3)
Indirect Bilirubin: 1 mg/dL — ABNORMAL HIGH (ref 0.3–0.9)
Total Protein: 5.3 g/dL — ABNORMAL LOW (ref 6.0–8.3)

## 2014-07-19 LAB — PROTIME-INR
INR: 1.32 (ref 0.00–1.49)
PROTHROMBIN TIME: 16.4 s — AB (ref 11.6–15.2)

## 2014-07-19 LAB — HEMOGLOBIN A1C
Hgb A1c MFr Bld: 7.2 % — ABNORMAL HIGH (ref <5.7)
MEAN PLASMA GLUCOSE: 160 mg/dL — AB (ref <117)

## 2014-07-19 LAB — OSMOLALITY, URINE: Osmolality, Ur: 450 mOsm/kg (ref 390–1090)

## 2014-07-19 LAB — STREP PNEUMONIAE URINARY ANTIGEN: Strep Pneumo Urinary Antigen: NEGATIVE

## 2014-07-19 LAB — LIPASE, BLOOD: Lipase: 147 U/L — ABNORMAL HIGH (ref 11–59)

## 2014-07-19 LAB — TSH: TSH: 1.9 u[IU]/mL (ref 0.350–4.500)

## 2014-07-19 LAB — MAGNESIUM: Magnesium: 1.5 mg/dL (ref 1.5–2.5)

## 2014-07-19 MED ORDER — ONDANSETRON HCL 4 MG/2ML IJ SOLN: 4.0000 mg | Freq: Four times a day (QID) | INTRAMUSCULAR | Status: DC | PRN

## 2014-07-19 MED ORDER — GLUCOSE-VITAMIN C 4-6 GM-MG PO CHEW: 4.0000 | CHEWABLE_TABLET | ORAL | Status: DC | PRN

## 2014-07-19 MED ORDER — IOHEXOL 300 MG/ML  SOLN
50.0000 mL | Freq: Once | INTRAMUSCULAR | Status: AC | PRN
  Administered 2014-07-19: 50 mL via ORAL

## 2014-07-19 MED ORDER — DEXTROSE 50 % IV SOLN: 50.0000 mL | Freq: Once | INTRAVENOUS | Status: AC | PRN

## 2014-07-19 MED ORDER — INSULIN ASPART 100 UNIT/ML ~~LOC~~ SOLN
0.0000 [IU] | SUBCUTANEOUS | Status: DC
  Administered 2014-07-19: 2 [IU] via SUBCUTANEOUS
  Administered 2014-07-19: 1 [IU] via SUBCUTANEOUS
  Administered 2014-07-19 (×3): 2 [IU] via SUBCUTANEOUS
  Administered 2014-07-20: 1 [IU] via SUBCUTANEOUS
  Administered 2014-07-20 (×2): 2 [IU] via SUBCUTANEOUS
  Administered 2014-07-20: 1 [IU] via SUBCUTANEOUS
  Administered 2014-07-20: 3 [IU] via SUBCUTANEOUS
  Administered 2014-07-20 (×2): 2 [IU] via SUBCUTANEOUS
  Administered 2014-07-21 (×2): 1 [IU] via SUBCUTANEOUS
  Administered 2014-07-21: 2 [IU] via SUBCUTANEOUS
  Administered 2014-07-21: 1 [IU] via SUBCUTANEOUS
  Administered 2014-07-21 – 2014-07-22 (×3): 2 [IU] via SUBCUTANEOUS
  Administered 2014-07-22: 1 [IU] via SUBCUTANEOUS
  Administered 2014-07-22: 2 [IU] via SUBCUTANEOUS
  Administered 2014-07-22: 3 [IU] via SUBCUTANEOUS
  Administered 2014-07-23 (×4): 2 [IU] via SUBCUTANEOUS

## 2014-07-19 MED ORDER — SODIUM CHLORIDE 0.9 % IV BOLUS (SEPSIS)
1000.0000 mL | Freq: Once | INTRAVENOUS | Status: AC
  Administered 2014-07-19: 500 mL via INTRAVENOUS

## 2014-07-19 MED ORDER — MAGNESIUM SULFATE 50 % IJ SOLN
3.0000 g | Freq: Once | INTRAVENOUS | Status: AC
  Administered 2014-07-19: 3 g via INTRAVENOUS
  Filled 2014-07-19: qty 6

## 2014-07-19 MED ORDER — SODIUM CHLORIDE 0.9 % IJ SOLN
3.0000 mL | Freq: Two times a day (BID) | INTRAMUSCULAR | Status: DC
  Administered 2014-07-19 – 2014-07-23 (×6): 3 mL via INTRAVENOUS

## 2014-07-19 MED ORDER — PANTOPRAZOLE SODIUM 40 MG IV SOLR
40.0000 mg | Freq: Every day | INTRAVENOUS | Status: DC
  Administered 2014-07-19 – 2014-07-23 (×5): 40 mg via INTRAVENOUS
  Filled 2014-07-19 (×7): qty 40

## 2014-07-19 MED ORDER — PIPERACILLIN-TAZOBACTAM 3.375 G IVPB
3.3750 g | Freq: Three times a day (TID) | INTRAVENOUS | Status: DC
  Administered 2014-07-19 – 2014-07-21 (×8): 3.375 g via INTRAVENOUS
  Filled 2014-07-19 (×10): qty 50

## 2014-07-19 MED ORDER — ASPIRIN 81 MG PO CHEW
81.0000 mg | CHEWABLE_TABLET | Freq: Every day | ORAL | Status: DC
  Administered 2014-07-19 – 2014-07-24 (×5): 81 mg via ORAL
  Filled 2014-07-19 (×6): qty 1

## 2014-07-19 MED ORDER — ONDANSETRON HCL 4 MG PO TABS: 4.0000 mg | ORAL_TABLET | Freq: Four times a day (QID) | ORAL | Status: DC | PRN

## 2014-07-19 MED ORDER — DOCUSATE SODIUM 100 MG PO CAPS
100.0000 mg | ORAL_CAPSULE | Freq: Two times a day (BID) | ORAL | Status: DC
  Administered 2014-07-20 – 2014-07-23 (×4): 100 mg via ORAL
  Filled 2014-07-19 (×13): qty 1

## 2014-07-19 MED ORDER — GLUCAGON (RDNA) 1 MG IJ KIT: 1.0000 mg | PACK | Freq: Once | INTRAMUSCULAR | Status: DC | PRN

## 2014-07-19 MED ORDER — INSULIN GLARGINE 100 UNIT/ML ~~LOC~~ SOLN
15.0000 [IU] | Freq: Every day | SUBCUTANEOUS | Status: DC
  Administered 2014-07-19 – 2014-07-23 (×5): 15 [IU] via SUBCUTANEOUS
  Filled 2014-07-19 (×6): qty 0.15

## 2014-07-19 MED ORDER — SODIUM CHLORIDE 0.9 % IV SOLN
INTRAVENOUS | Status: DC
  Administered 2014-07-19 – 2014-07-20 (×2): via INTRAVENOUS
  Filled 2014-07-19 (×6): qty 1000

## 2014-07-19 MED ORDER — GLUCOSE 40 % PO GEL: 1.0000 | ORAL | Status: DC | PRN

## 2014-07-19 MED ORDER — MORPHINE SULFATE 2 MG/ML IJ SOLN
2.0000 mg | INTRAMUSCULAR | Status: DC | PRN
  Administered 2014-07-19 – 2014-07-21 (×2): 2 mg via INTRAVENOUS
  Filled 2014-07-19 (×2): qty 1

## 2014-07-19 MED ORDER — SODIUM CHLORIDE 0.9 % IV SOLN: INTRAVENOUS | Status: DC

## 2014-07-19 MED ORDER — DEXTROSE 50 % IV SOLN: 25.0000 mL | Freq: Once | INTRAVENOUS | Status: AC | PRN

## 2014-07-19 NOTE — Consult Note (Addendum)
Consultation  Referring Provider: [facility]     Primary Care Physician:  [name], MD Primary Gastroenterologist: [name], MD        Reason for Consultation:  pancreatitis            HPI:   [name] is a 53 y.o. male with multiple medical problems including cirrhosis.[name] He presented to to ED two days ago with nausea, vomiting and epigastric pain. He had seen PCP , was diagnosed with pancreatitis, and sent to ED. Lipase 619. Pain started Thursday. It was epigastric, non-radiating and constant in the beginning. He reports fever of 103. Now feeling much better. Patient reports similar episode last year for which he was hospitalized in [place]. Sounds like he was diagnosed with biliary pancreatitis though gallbladder never removed. Since then patient has done relatively well. No unusual weight loss. No chronic GI complaints.   Past Medical History  Diagnosis Date  . Heart murmur     ECHO 07/2011 showed mild AS and LVH.  TEE 09/2011 showed small PFO, bicuspid aortic valve but no stenosis or regurg.  . 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  F/u u/s for ongoing/worsening thrombocytopenia showed no more splenomegaly.  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  Gen surg and GI recommended flagyl and cipro x 2 wks..  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  . Cholelithiasis 09/2011; 08/2013    u/s--no cholecystitis.  06/2014 u/s showed no stones, only GB sludge  . Alcoholism   . Peripheral neuropathy 2012    Diabetic:  Autonomic (pelvic) and LE polyneuropathy--[facility] testing showed that it is likely from DM  . 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 Arthritis     back L3 - L5  . Hyperactive gag  reflex   . History of pyelonephritis 09/2011  . Type II or unspecified type diabetes mellitus with neurological manifestations, not stated as uncontrolled     IDDM: neuro, renal, and ophth complications  . Wears dentures     upper  . Osteomyelitis of toe of left foot 10/22/12    left 4th and 5th toes--now s/p amputation of these areas  . Diabetic retinopathy associated with type 2 diabetes mellitus     Laser tx in both eyes  . Diabetic nephropathy 09/2013    Proteinuria and CrCl 55-65  . Chronic renal insufficiency, stage III (moderate)   . Lumbar spondylosis     MRI 08/25/11 with multilevel facet dz, small disc protrusion at L5-S1 to the right without impingement    Past Surgical History  Procedure Laterality Date  . Knee arthroscopy  2001    Bilateral  . Amputation  10/24/2012    Procedure: AMPUTATION RAY;  Surgeon: Sherri Rad, MD;  Location: [facility];  Service: Orthopedics;  Laterality: Left;  left 4th toe amputation through MTP joint, 5th Ray amputation   . Knee surgery  2001  . Hida scan  06/2014    Normal    Family History  Problem Relation Age of Onset  . Heart disease Father   . Diabetes type II Sister    No pancreatic cancers  History  Substance Use Topics  . Smoking status:  Never Smoker   . Smokeless tobacco: Current User    Types: Chew  . Alcohol Use: No     Comment: Quit drinking alcohol 08/20/2011    Prior to Admission medications   Medication Sig Start Date End Date Taking? Authorizing Provider  furosemide (LASIX) 40 MG tablet Take 40 mg by mouth 2 (two) times daily.   Yes Historical Provider, MD  glucagon 1 MG injection Inject 1 mg into the vein once as needed. For hypoglycemia 02/25/14  Yes Jeoffrey MassedPhilip H McGowen, MD  insulin aspart (NOVOLOG) 100 UNIT/ML injection Inject 10 Units into the skin 3 (three) times daily before meals.   Yes Historical Provider, MD  insulin glargine (LANTUS) 100 UNIT/ML injection Inject 38 Units into the skin at  bedtime. 08/23/13  Yes Jeoffrey MassedPhilip H McGowen, MD  lisinopril (PRINIVIL,ZESTRIL) 20 MG tablet Take 1 tablet (20 mg total) by mouth daily. 12/25/13  Yes Jeoffrey MassedPhilip H McGowen, MD  pantoprazole (PROTONIX) 40 MG tablet Take 1 tablet (40 mg total) by mouth daily. 07/04/14  Yes Jeoffrey MassedPhilip H McGowen, MD  promethazine (PHENERGAN) 25 MG tablet Take 1 tablet (25 mg total) by mouth every 6 (six) hours as needed for nausea or vomiting. 07/18/14  Yes Jeoffrey MassedPhilip H McGowen, MD  ranitidine (ZANTAC) 150 MG tablet Take 1 tablet (150 mg total) by mouth at bedtime. 07/04/14  Yes Jeoffrey MassedPhilip H McGowen, MD  promethazine (PHENERGAN) 25 MG suppository Place 1 suppository (25 mg total) rectally every 6 (six) hours as needed for nausea or vomiting. 07/18/14   Jeoffrey MassedPhilip H McGowen, MD    Current Facility-Administered Medications  Medication Dose Route Frequency Provider Last Rate Last Dose  . dextrose (GLUTOSE) 40 % oral gel 37.5 g  1 Tube Oral PRN Therisa DoyneAnastassia Doutova, MD      . dextrose 50 % solution 25 mL  25 mL Intravenous Once PRN Therisa DoyneAnastassia Doutova, MD      . dextrose 50 % solution 50 mL  50 mL Intravenous Once PRN Therisa DoyneAnastassia Doutova, MD      . docusate sodium (COLACE) capsule 100 mg  100 mg Oral BID Therisa DoyneAnastassia Doutova, MD   100 mg at 07/19/14 1020  . glucose-Vitamin C 4-0.006 GM per chewable tablet 4 tablet  4 tablet Oral PRN Therisa DoyneAnastassia Doutova, MD      . insulin aspart (novoLOG) injection 0-9 Units  0-9 Units Subcutaneous 6 times per day Therisa DoyneAnastassia Doutova, MD   2 Units at 07/19/14 0800  . insulin glargine (LANTUS) injection 15 Units  15 Units Subcutaneous QHS Anastassia Doutova, MD      . magnesium sulfate 3 g in dextrose 5 % 100 mL IVPB  3 g Intravenous Once Rodolph Bonganiel Thompson V, MD   3 g at 07/19/14 0834  . morphine 2 MG/ML injection 2-4 mg  2-4 mg Intravenous Q4H PRN Therisa DoyneAnastassia Doutova, MD      . ondansetron (ZOFRAN) tablet 4 mg  4 mg Oral Q6H PRN Therisa DoyneAnastassia Doutova, MD       Or  . ondansetron (ZOFRAN) injection 4 mg  4 mg Intravenous Q6H PRN  Therisa DoyneAnastassia Doutova, MD      . pantoprazole (PROTONIX) injection 40 mg  40 mg Intravenous QHS Therisa DoyneAnastassia Doutova, MD      . piperacillin-tazobactam (ZOSYN) IVPB 3.375 g  3.375 g Intravenous 3 times per day Therisa DoyneAnastassia Doutova, MD   3.375 g at 07/19/14 0516  . sodium chloride 0.9 % 1,000 mL infusion   Intravenous Continuous Rodolph Bonganiel Thompson V, MD      . sodium  chloride 0.9 % injection 3 mL  3 mL Intravenous Q12H Therisa Doyne, MD   3 mL at 07/19/14 1022    Allergies as of 07/18/2014 - Review Complete 07/18/2014  Allergen Reaction Noted  . Hydralazine hcl Hives 03/04/2013  . Naproxen Nausea And Vomiting 07/14/2011  . Other Itching and Other (See Comments) 10/23/2012    Review of Systems:    All systems reviewed and negative except where noted in HPI, also very weak    Physical Exam:  Vital signs in last 24 hours: Temp:  [97.9 F (36.6 C)-103 F (39.4 C)] 97.9 F (36.6 C) (08/01 0508) Pulse Rate:  [76-95] 77 (08/01 0508) Resp:  [16-28] 21 (08/01 0508) BP: (122-155)/(52-72) 138/72 mmHg (08/01 0508) SpO2:  [92 %-96 %] 95 % (08/01 0508) Weight:  [233 lb (105.688 kg)] 233 lb (105.688 kg) (08/01 0209) Last BM Date: 07/18/14 General:   Pleasant obese white male in NAD. Sister in room Head:  Normocephalic and atraumatic. Eyes:   No icterus.   Conjunctiva pink. Ears:  Normal auditory acuity. Neck:  Supple; no masses felt Lungs:  Respirations even and unlabored. Lungs clear to auscultation bilaterally.   No wheezes, crackles, or rhonchi.  Heart:  Regular rate and rhythm Abdomen:  Soft, obese, nondistended, nontender. Normal bowel sounds. No appreciable masses or hepatomegaly.  Rectal:  Not performed.  Msk:  Symmetrical without gross deformities.  Extremities:  Without edema. Neurologic:  Alert and  oriented x4;  grossly normal neurologically. Skin:  Intact without significant lesions or rashes. Cervical Nodes:  No significant cervical adenopathy. Psych:  Alert and cooperative.  Normal affect.  LAB RESULTS:  Recent Labs  07/18/14 1647 07/18/14 2142 07/19/14 0451  WBC 10.6* 8.7 5.9  HGB 11.3* 11.2* 10.4*  HCT 32.2* 33.5* 31.2*  PLT 57* 52* 49*   BMET  Recent Labs  07/18/14 1647 07/18/14 2142 07/19/14 0451  NA 135 133* 135*  K 4.6 4.2 3.8  CL 100 97 100  CO2 24 21 21   GLUCOSE 193* 209* 200*  BUN 54* 56* 59*  CREATININE 1.87* 1.71* 1.92*  CALCIUM 8.3* 8.5 7.9*   LFT  Recent Labs  07/19/14 0451  PROT 5.3*  ALBUMIN 2.4*  AST 61*  ALT 66*  ALKPHOS 105  BILITOT 7.0*   PT/INR  Recent Labs  07/18/14 2345  LABPROT 16.4*  INR 1.32    STUDIES: Ct Abdomen Pelvis Wo Contrast  07/19/2014   CLINICAL DATA:  Fever, epigastric pain, and vomiting.  EXAM: CT ABDOMEN AND PELVIS WITHOUT CONTRAST  TECHNIQUE: Multidetector CT imaging of the abdomen and pelvis was performed following the standard protocol without IV contrast.  COMPARISON:  10/02/2011  FINDINGS: Small left pleural effusion with basilar atelectasis or consolidation. Mild cardiac enlargement and small pericardial effusion. Coronary artery calcifications.  Enlarged lateral segment left lobe of liver and nodular liver contour compatible with cirrhosis. Spleen is enlarged. Epigastric and splenic vein varices. Prominent celiac axis and porta hepatis lymph nodes are probably reactive and associated with cirrhosis. Small amount of free fluid in the upper abdomen around the liver likely due to ascites. Gallbladder is mildly distended with small stones present. Pancreas is atrophic. Small duodenum diverticulum containing contrast material. No bile duct dilatation. Adrenal glands, abdominal aorta, inferior vena cava, and retroperitoneal lymph nodes are unremarkable. Sub cm hyper dense focus in the mid pole of the right kidney probably represents small hemorrhagic cyst. No hydronephrosis in the kidneys. Stomach and small bowel are decompressed. Contrast material flows through  to the colon without evidence of  bowel obstruction. Colon is decompressed with scattered stool. No free air in the abdomen.  Pelvis: Mild thickening of the bladder wall. No free or loculated pelvic fluid collections. Appendix is not identified. No evidence of diverticulitis. No pelvic mass or lymphadenopathy. Degenerative changes in the spine and hips. No evidence of destructive bone lesion.  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 Cholelithiasis. Small amount of ascites. Fatty atrophy of the pancreas. Bladder wall thickening may indicate cystitis or bladder hypertrophy. Left pleural effusion with consolidation or atelectasis in the left lung base, possibly pneumonia.   Electronically Signed   By: Burman Nieves M.D.   On: 07/19/2014 01:24   Dg Chest 2 View  07/18/2014   CLINICAL DATA:  Vomiting, short of breath and chest pain  EXAM: CHEST  2 VIEW  COMPARISON:  Prior chest x-ray 02/19/2013  FINDINGS: Interval development of lingular opacity obscuring into the cardiac apex. No definite pleural effusion visible on the lateral view. Cardiac and mediastinal contours are unchanged. The right lung remains well aerated. No acute osseous abnormality. Degenerative change in the bilateral acromioclavicular joints.  IMPRESSION: New opacity in the lingula obscuring the left cardiac apex may reflect atelectasis or infiltrate.   Electronically Signed   By: Malachy Moan M.D.   On: 07/18/2014 17:50   US Abdomen Complete  07/19/2014   CLINICAL DATA:  Cirrhosis and fever.  EXAM: ULTRASOUND ABDOMEN COMPLETE  COMPARISON:  06/27/2014  FINDINGS: Gallbladder:  Gallbladder is moderately distended with sludge and focal tumefactive sludge demonstrated. No discrete stones. Appearance is similar prior study. Borderline wall thickening. Murphy's sign is negative.  Common bile duct:  Diameter: 7 mm, normal  Liver:  No focal lesion identified. Within normal limits in  parenchymal echogenicity.  IVC:  No abnormality visualized.  Pancreas:  Visualized portion unremarkable.  Spleen:  Size and appearance within normal limits.  Right Kidney:  Length: 13.2 cm. Echogenicity within normal limits. No mass or hydronephrosis visualized.  Left Kidney:  Length: 13.8 cm. Echogenicity within normal limits. No mass or hydronephrosis visualized.  Abdominal aorta:  No aneurysm visualized.  Other findings:  None.  IMPRESSION: Gallbladder is moderately distended with sludge and borderline wall thickening. Murphy's sign is negative. Appearance is similar to prior study.   Electronically Signed   By: Burman Nieves M.D.   On: 07/19/2014 00:57   Images viewed  Impression / Plan:   35. 53 year old male with acute epigastric pain, nausea, vomiting and biochemical pancreatitis. Fevers of 103 at home, admitted with sepsis.  Lipase in 600 range but no findings of pancreatitis on non-contrast CTscan. In fact there is fatty atrophy of the pancreas. Etiology of pancreatitis unclear. Patient describes biliary pancreatitis last year in [place], [place]. This could be biliary pancreatitis but CBD size normal. He does have elevated bilirubin which could be secondary to decompensated cirrhosis but no other evidence for decompensation. . Gallbladder disease on list of different diagnoses as well. Gallbladder mildly distended with sludge on ultrasound.  Continue supportive care. IVF at 130ml/hr, hct 31%. Currently getting Zosyn for treatment of sepsis. His renal function has deteriorated and that is concerning but he otherwise looks and feels okay today  2. Cirrhosis, complicated by portal HTN. ?ETOH related , ?NASH. Viral hepatitis studies, ANA, ASMA negative in 2012.  No encephalopathy, only small volume ascites, INR norma. He appears compensated with exception of elevated bilirubin, .  3. Troponin elevated at 2.19 with EKG changes. Echo pending. Cardiology to see.   4. AKI. Continue IVF. HCT is  31%  Thanks   LOS: 1 day   Willette Cluster  07/19/2014, 10:28 AM  [facility] Attending  I have also seen and assessed the patient and agree with the above note. Complicated patient with fever and illness - not clear why.  Either has pneumonia or CHF or both - Echo shows depressed EF so suspect latter at least though would not cause fever. SBP possible but not enough fluid to tap. Lack of contrast impairs sensitivity of CT.  As far as pancreatitis may have passed a stone. Does not seem to have cholecystitis.  1) Allow clears and advance if tolerated 2) Hepatitis immunity serologies in anticipation of vaccines (checl immunity to HBV and HAV) 3) continue Zosyn 4) MRCP may be helpful - can do w/o contrast but wait on creat, EUS also possible, may have biliary microlithiasis 5) will follow  Iva Boop, MD, [facility] [phone] (pager) 07/19/2014 3:48 PM

## 2014-07-19 NOTE — Progress Notes (Signed)
MD present at this time and made aware of critical Troponin.

## 2014-07-19 NOTE — Progress Notes (Signed)
  Echocardiogram 2D Echocardiogram has been performed.  [name] M 07/19/2014, 11:04 AM

## 2014-07-19 NOTE — Consult Note (Addendum)
CARDIOLOGY CONSULT NOTE  Patient ID: [name] MRN: [id number] DOB/AGE: Sep 12, 1961 53 y.o.  Admit date: 07/18/2014  Reason for Consultation: Elevated troponin  HPI: 53 yo with history of cirrhosis, diabetes, and bicuspid aortic valve presented to ER with nausea, vomiting, abdominal pain, and fever up to 103 at home.  Lipase 518 suggestive of acute pancreatitis. He quit drinking 2 yrs ago, so pancreatitis may be related to gallbladder disease (GI involved, this is being worked up).  He also has LLL PNA, possibly aspiration.  He received IV fluid initially.  I cannot find where there ever was a significant reduction in blood pressure.  Patient had an ECG showing some lateral T wave inversions so troponin was drawn and found to be 2. Echo showed EF 30-35% (diffuse HK) with bicuspid aortic valve and mild AS and mild AI (no prior that I can find).   Currently, the abdominal pain is much improved. He has not had any chest pain. No dyspnea.  He does appear to be rigoring as I speak with him. He denies any prior cardiac problems though it appears that the bicuspid aortic valve was known from the past.   Review of systems complete and found to be negative unless listed above in HPI  Past Medical History: 1. Bicuspid aortic valve: Echo (8/15) with EF 35-40%, moderate LV dilation, diffuse hypokinesis, bicuspid aortic valve with mild AS and mild AI.   2. Cardiomyopathy: See 8/15 echo above.  No other echo in system.  3. Cirrhosis: ETOH (but does not seem to have been extremely heavy drinker) versus NASH. Has portal hypertension/splenomegaly.  4. Perforated diverticulum 10/12. 5. Thrombocytopenia: Suspected to be due to cirrhosis.  6. Diabetes: with foot ulcers, nephropathy, retinopathy.  7. Cholelithiasis 8. H/o pyelonephritis 9. Low back pain 10. CKD 11. H/o osteomyelitis  Family History  Problem Relation Age of Onset  . Heart disease Father   . Diabetes type II Sister     History    Social History  . Marital Status: Single    Spouse Name: N/A    Number of Children: N/A  . Years of Education: N/A   Occupational History  . Not on file.   Social History Main Topics  . Smoking status: Never Smoker   . Smokeless tobacco: Current User    Types: Chew  . Alcohol Use: No     Comment: Quit drinking alcohol 08/20/2011  . Drug Use: No  . Sexual Activity: Not on file   Other Topics Concern  . Not on file   Social History Narrative   Single, no children.  Lives in [place], relocated from [place], [place] in 2008.   Chews tobacco but does not smoke.   Drinks 12 Beers daily---patient did not initially report/admit this--as of 2013 he has quit this.   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     Prescriptions prior to admission  Medication Sig Dispense Refill  . furosemide (LASIX) 40 MG tablet Take 40 mg by mouth 2 (two) times daily.      [name] glucagon 1 MG injection Inject 1 mg into the vein once as needed. For hypoglycemia  1 each  2  . insulin aspart (NOVOLOG) 100 UNIT/ML injection Inject 10 Units into the skin 3 (three) times daily before meals.      . insulin glargine (LANTUS) 100 UNIT/ML injection Inject 38 Units into the skin at bedtime.      [name] lisinopril (PRINIVIL,ZESTRIL) 20 MG tablet  Take 1 tablet (20 mg total) by mouth daily.  30 tablet  6  . pantoprazole (PROTONIX) 40 MG tablet Take 1 tablet (40 mg total) by mouth daily.  30 tablet  3  . promethazine (PHENERGAN) 25 MG tablet Take 1 tablet (25 mg total) by mouth every 6 (six) hours as needed for nausea or vomiting.  30 tablet  1  . ranitidine (ZANTAC) 150 MG tablet Take 1 tablet (150 mg total) by mouth at bedtime.  30 tablet  3  . promethazine (PHENERGAN) 25 MG suppository Place 1 suppository (25 mg total) rectally every 6 (six) hours as needed for nausea or vomiting.  12 each  1    Physical exam Blood pressure 134/67, pulse 77, temperature 98.3 F (36.8 C), temperature source Oral, resp. rate  19, height 5\' 10"  (1.778 m), weight 233 lb (105.688 kg), SpO2 96.00%. General: actively rigoring Neck: Thick, no JVD, no thyromegaly or thyroid nodule.  Lungs: Clear to auscultation bilaterally with normal respiratory effort. CV: Nondisplaced PMI.  Heart regular S1/S2, no S3/S4, 2/6 SEM, S2 heard clearly.  No peripheral edema.  No carotid bruit.  Normal pedal pulses.  Abdomen: Soft, mild tender to deep palpation, no distention.  Skin: Intact without lesions or rashes.  Neurologic: Alert and oriented x 3.  Psych: Normal affect. Extremities: No clubbing or cyanosis.  HEENT: Normal.   Labs:   Lab Results  Component Value Date   WBC 5.9 07/19/2014   HGB 10.4* 07/19/2014   HCT 31.2* 07/19/2014   MCV 85.7 07/19/2014   PLT 49* 07/19/2014    Recent Labs Lab 07/19/14 0451 07/19/14 1344  NA 135*  --   K 3.8  --   CL 100  --   CO2 21  --   BUN 59*  --   CREATININE 1.92*  --   CALCIUM 7.9*  --   PROT 5.3* 5.3*  BILITOT 7.0* 7.3*  ALKPHOS 105 101  ALT 66* 59*  AST 61* 55*  GLUCOSE 200*  --   TnI 2.19 => 2.26 Lipase 518   Radiology: - CT abdomen: Cirrhosis, splenomegaly, fatty atrophy of pancreas, cholelithiasis, LLL PNA - Abdominal US: Gallbladder distended with sludge  EKG: NSR, poor RWP, lateral TWIs new from 2013  ASSESSMENT AND PLAN: 53 yo with history of cirrhosis, diabetes, and bicuspid aortic valve presented to ER with nausea, vomiting, abdominal pain, and fever up to 103 at home.  Lipase 518 suggestive of acute pancreatitis.  He had troponin drawn in the hospital, it was elevated to 2.  Echo showed bicuspid aortic valve with EF 35-40%, diffuse hypokinesis.  1. Elevated troponin: Cause of troponin rise is unclear.  He has had no chest pain or dyspnea.  He has had epigastric pain and tenderness thought to be due to pancreatitis.  He has had high fever and rigors with PNA versus GI source, but he does not appear to have been hypotensive so hard to pin this on demand ischemia  definitively.  For now, given lack of symptoms and lack of upward trend to troponin (2.19 and 2.26) and well as elevated creatinine and thrombocytopenia, would not do invasive evaluation.  - Trend troponin to peak - Can add ASA 81 - If no chest pain or marked upward troponin trend, would plan Cardiolite evaluation either as inpatient or outpatient.  - Hold off on heparin gtt with no chest pain and low plts.  2. Cardiomyopathy: EF 35-40%.  Global hypokinesis, so hard to attribute this to  CAD definitively.  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  He does not look volume overloaded.  - Start Coreg 3.125 mg bid.  3. ID: LLL PNA, ?aspiration versus GI source for fever/infection.  He is on broad spectrum antibiotics.  4. Acute pancreatitis: Suspect with lipase in the 500s with nausea/vomiting/epigastric pain.  Pain has resolved.  Possible that he passed a gallstone.  GI is involved.   5. Bicuspid aortic valve: Mild AS and AI.  Will need long-term followup.  6. AKI: No nephrotoxins, follow.   Marca Ancona 07/19/2014

## 2014-07-19 NOTE — Progress Notes (Signed)
Was told by a nurse and initial set of troponin was elevated at 2.19. Patient did have EKG changes of T-wave inversion in leads 1 and aVL and now with elevated troponin. Patient chest pain-free. However had presented with nausea vomiting and epigastric pain. 2-D echo is pending. Patient with a chronic thrombocytopenia with platelets of 49 and a such will hold off on placing none overdose heparin for now until patient is assessed by cardiology. Will consult with cardiology for further evaluation and management.

## 2014-07-19 NOTE — Progress Notes (Signed)
Patient has positive blood cultures gram negative rods. RN notified.

## 2014-07-19 NOTE — Progress Notes (Signed)
ANTIBIOTIC CONSULT NOTE - INITIAL  Pharmacy Consult for zosyn, Pharmacy is consulted for antibiotic monitoring  Indication: rule out pneumonia and rule out sepsis  Allergies  Allergen Reactions  . Hydralazine Hcl Hives  . Naproxen Nausea And Vomiting  . Other Itching and Other (See Comments)    PERFUMES - SNEEZING    Patient Measurements: Height: 5\' 10"  (177.8 cm) Weight: 233 lb (105.688 kg) IBW/kg (Calculated) : 73 Adjusted Body Weight:   Vital Signs: Temp: 98.3 F (36.8 C) (08/01 0209) Temp src: Oral (08/01 0209) BP: 128/58 mmHg (08/01 0209) Pulse Rate: 76 (08/01 0209) Intake/Output from previous day: 07/31 0701 - 08/01 0700 In: 578.8 [I.V.:578.8] Out: -  Intake/Output from this shift: Total I/O In: 578.8 [I.V.:578.8] Out: -   Labs:  Recent Labs  07/18/14 1647 07/18/14 2142  WBC 10.6* 8.7  HGB 11.3* 11.2*  PLT 57* 52*  CREATININE 1.87* 1.71*   Estimated Creatinine Clearance: 62.2 ml/min (by C-G formula based on Cr of 1.71). No results found for this basename: VANCOTROUGH, VANCOPEAK, VANCORANDOM, GENTTROUGH, GENTPEAK, GENTRANDOM, TOBRATROUGH, TOBRAPEAK, TOBRARND, AMIKACINPEAK, AMIKACINTROU, AMIKACIN,  in the last 72 hours   Microbiology: No results found for this or any previous visit (from the past 720 hour(s)).  Medical History: Past Medical History  Diagnosis Date  . Heart murmur     ECHO 07/2011 showed mild AS and LVH.  TEE 09/2011 showed small PFO, bicuspid aortic valve but no stenosis or regurg.  . 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  F/u u/s for ongoing/worsening thrombocytopenia showed no more splenomegaly.  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  Gen surg and GI recommended flagyl and cipro x 2 wks..  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  . Cholelithiasis 09/2011; 08/2013    u/s--no cholecystitis.  06/2014 u/s showed no stones, only GB sludge  . Alcoholism   . Peripheral neuropathy 2012    Diabetic:  Autonomic (pelvic) and LE polyneuropathy--[facility] testing showed that it is likely from DM  . 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 Arthritis     back L3 - L5  . Hyperactive gag reflex   . History of pyelonephritis 09/2011  . Type II or unspecified type diabetes mellitus with neurological manifestations, not stated as uncontrolled     IDDM: neuro, renal, and ophth complications  . Wears dentures     upper  . Osteomyelitis of toe of left foot 10/22/12    left 4th and 5th toes--now s/p amputation of these areas  . Diabetic retinopathy associated with type 2 diabetes mellitus     Laser tx in both eyes  . Diabetic nephropathy 09/2013    Proteinuria and CrCl 55-65  . Chronic renal insufficiency, stage III (moderate)   . Lumbar spondylosis     MRI 08/25/11 with multilevel facet dz, small disc protrusion at L5-S1 to the right without impingement    Medications:  Anti-infectives   Start     Dose/Rate Route Frequency Ordered Stop   07/19/14 0600  piperacillin-tazobactam (ZOSYN) IVPB 3.375 g     3.375 g 12.5 mL/hr over 240 Minutes Intravenous 3 times per day 07/19/14 0202     07/18/14 2345  piperacillin-tazobactam (ZOSYN) IVPB 3.375 g  3.375 g 100 mL/hr over 30 Minutes Intravenous  Once 07/18/14 2331 07/19/14 0046   07/18/14 2330  piperacillin-tazobactam (ZOSYN) IVPB 3.375 g  Status:  Discontinued     3.375 g 12.5 mL/hr over 240 Minutes Intravenous  Once 07/18/14 2325 07/18/14 2330     Assessment: Patient with r/o sepsis (GI) and/or r/o PNA.  First dose of antibiotics already given.   Goal of Therapy:  Zosyn based on renal function   Plan:  Follow up culture results Zosyn 3.375g IV Q8H infused over 4hrs.   Darlina GuysGrimsley Jr, Jacquenette ShoneJulian  Crowford 07/19/2014,5:08 AM

## 2014-07-19 NOTE — Progress Notes (Signed)
[facility] PROGRESS NOTE  MILBURN FREENEY ZOX:[id number] DOB: 09-Jul-1961 DOA: 07/18/2014 PCP: [name], MD  Assessment/Plan: #1 sepsis likely secondary to pneumonia and probable GI source Patient currently afebrile with clinical improvement. Blood cultures pending. Urine cultures pending. Sputum Gram stain and cultures are pending. WBC is normalized. Continue empiric IV Zosyn. Follow.  #2 probable gallstone pancreatitis Lipase levels on admission was 679. LFTs elevated but trending down. Total bilirubin however is elevated at 7.0. Abdominal ultrasound with a moderately distended gallbladder with sludge and borderline wall thickening similar to prior study. Murphy sign is negative. CT of the abdomen and pelvis with changes of hepatic cirrhosis and evidence of portal venous hypertension including splenic vein varices and splenic enlargement. Cholelithiasis. Fatty atrophy of the pancreas. Patient had a hiatus scan done on 07/04/2014 with no evidence of cystic duct obstruction. Will check a lipase level. Will check hepatic panel. Fractionated bilirubin. Continue IV fluids. Ice chips. Supportive care. Will consult with GI for further evaluation and management.  #3 probable aspiration pneumonia versus community-acquired pneumonia Patient had an episode of nausea and vomiting concern for aspiration pneumonia. Chest x-ray and CT scan consistent with probable pneumonia. Patient is currently afebrile. Blood cultures are pending. Sputum Gram stain and cultures pending. Continue empiric IV Zosyn. Follow.  #4 abnormal EKG EKG with T wave inversion in leads 1 and aVL. Patient did present with epigastric pain however denies any chest pain. Will cycle cardiac enzymes every 6 hours x3. Check a 2-D echo. Follow.  #5 type 2 diabetes Hemoglobin A1c 7.4 06/26/2014. CBGs have ranged from 150 to 01/20/2007. Continue current dose of Lantus. Sliding scale insulin.  #6 chronic kidney disease stage II Urine  sodium and urine creatinine pending. Continue hydration. Follow.  #7 chronic thrombocytopenia Likely secondary to alcohol abuse. No evidence of bleeding. Follow.  #8 dehydration IV fluids.  #9 hypertension Stable. Lisinopril on hold.  #10 prophylaxis PPI for GI prophylaxis. SCDs for DVT prophylaxis.  Code Status: Full Family Communication: Updated patient no family present. Disposition Plan: Home when medically stable.   Consultants:  GI pending  Procedures:  CT abdomen and pelvis 07/19/2014  Chest x-ray 07/18/2014  Abdominal ultrasound 07/19/2014  Antibiotics:  IV Zosyn 07/19/2014  HPI/Subjective: Patient states abdominal pain has improved. Patient denies any nausea or vomiting.  Objective: Filed Vitals:   07/19/14 0508  BP: 138/72  Pulse: 77  Temp: 97.9 F (36.6 C)  Resp: 21    Intake/Output Summary (Last 24 hours) at 07/19/14 0930 Last data filed at 07/19/14 0439  Gross per 24 hour  Intake 578.75 ml  Output      0 ml  Net 578.75 ml   Filed Weights   07/19/14 0209  Weight: 105.688 kg (233 lb)    Exam:   General:  Slightly jaundiced  Cardiovascular: RRR  Respiratory: Coarse BS in bases, no wheezing.  Abdomen: Soft, minimally tender to palpation in the epigastrium, positive bowel sounds, obese, no rebound, no guarding.  Musculoskeletal: No clubbing no cyanosis. Trace bilateral lower extremity edema.  Data Reviewed: Basic Metabolic Panel:  Recent Labs Lab 07/18/14 1647 07/18/14 2142 07/19/14 0451  NA 135 133* 135*  K 4.6 4.2 3.8  CL 100 97 100  CO2 24 21 21   GLUCOSE 193* 209* 200*  BUN 54* 56* 59*  CREATININE 1.87* 1.71* 1.92*  CALCIUM 8.3* 8.5 7.9*  MG  --   --  1.5  PHOS  --   --  3.6   Liver Function Tests:  Recent Labs Lab 07/18/14 1647 07/18/14 2142 07/19/14 0451  AST 91* 83* 61*  ALT 87* 83* 66*  ALKPHOS 113 124* 105  BILITOT 7.9* 7.4* 7.0*  PROT 5.4* 5.9* 5.3*  ALBUMIN 3.2* 2.8* 2.4*    Recent Labs Lab  07/18/14 1647 07/18/14 2142  LIPASE 679* 518*    Recent Labs Lab 07/18/14 2356  AMMONIA 37   CBC:  Recent Labs Lab 07/18/14 1647 07/18/14 2142 07/19/14 0451  WBC 10.6* 8.7 5.9  NEUTROABS 10.0* 8.1*  --   HGB 11.3* 11.2* 10.4*  HCT 32.2* 33.5* 31.2*  MCV 82.8 85.0 85.7  PLT 57* 52* 49*   Cardiac Enzymes: No results found for this basename: CKTOTAL, CKMB, CKMBINDEX, TROPONINI,  in the last 168 hours BNP (last 3 results) No results found for this basename: PROBNP,  in the last 8760 hours CBG:  Recent Labs Lab 07/18/14 2145 07/19/14 0213 07/19/14 0506 07/19/14 0727  GLUCAP 208* 202* 198* 152*    No results found for this or any previous visit (from the past 240 hour(s)).   Studies: Ct Abdomen Pelvis Wo Contrast  07/19/2014   CLINICAL DATA:  Fever, epigastric pain, and vomiting.  EXAM: CT ABDOMEN AND PELVIS WITHOUT CONTRAST  TECHNIQUE: Multidetector CT imaging of the abdomen and pelvis was performed following the standard protocol without IV contrast.  COMPARISON:  10/02/2011  FINDINGS: Small left pleural effusion with basilar atelectasis or consolidation. Mild cardiac enlargement and small pericardial effusion. Coronary artery calcifications.  Enlarged lateral segment left lobe of liver and nodular liver contour compatible with cirrhosis. Spleen is enlarged. Epigastric and splenic vein varices. Prominent celiac axis and porta hepatis lymph nodes are probably reactive and associated with cirrhosis. Small amount of free fluid in the upper abdomen around the liver likely due to ascites. Gallbladder is mildly distended with small stones present. Pancreas is atrophic. Small duodenum diverticulum containing contrast material. No bile duct dilatation. Adrenal glands, abdominal aorta, inferior vena cava, and retroperitoneal lymph nodes are unremarkable. Sub cm hyper dense focus in the mid pole of the right kidney probably represents small hemorrhagic cyst. No hydronephrosis in the  kidneys. Stomach and small bowel are decompressed. Contrast material flows through to the colon without evidence of bowel obstruction. Colon is decompressed with scattered stool. No free air in the abdomen.  Pelvis: Mild thickening of the bladder wall. No free or loculated pelvic fluid collections. Appendix is not identified. No evidence of diverticulitis. No pelvic mass or lymphadenopathy. Degenerative changes in the spine and hips. No evidence of destructive bone lesion.  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 Cholelithiasis. Small amount of ascites. Fatty atrophy of the pancreas. Bladder wall thickening may indicate cystitis or bladder hypertrophy. Left pleural effusion with consolidation or atelectasis in the left lung base, possibly pneumonia.   Electronically Signed   By: Burman NievesWilliam  Stevens M.D.   On: 07/19/2014 01:24   Dg Chest 2 View  07/18/2014   CLINICAL DATA:  Vomiting, short of breath and chest pain  EXAM: CHEST  2 VIEW  COMPARISON:  Prior chest x-ray 02/19/2013  FINDINGS: Interval development of lingular opacity obscuring into the cardiac apex. No definite pleural effusion visible on the lateral view. Cardiac and mediastinal contours are unchanged. The right lung remains well aerated. No acute osseous abnormality. Degenerative change in the bilateral acromioclavicular joints.  IMPRESSION: New opacity in the lingula obscuring the left cardiac apex may reflect atelectasis or infiltrate.  Electronically Signed   By: Malachy Moan M.D.   On: 07/18/2014 17:50   US Abdomen Complete  07/19/2014   CLINICAL DATA:  Cirrhosis and fever.  EXAM: ULTRASOUND ABDOMEN COMPLETE  COMPARISON:  06/27/2014  FINDINGS: Gallbladder:  Gallbladder is moderately distended with sludge and focal tumefactive sludge demonstrated. No discrete stones. Appearance is similar prior study. Borderline wall thickening. Murphy's sign  is negative.  Common bile duct:  Diameter: 7 mm, normal  Liver:  No focal lesion identified. Within normal limits in parenchymal echogenicity.  IVC:  No abnormality visualized.  Pancreas:  Visualized portion unremarkable.  Spleen:  Size and appearance within normal limits.  Right Kidney:  Length: 13.2 cm. Echogenicity within normal limits. No mass or hydronephrosis visualized.  Left Kidney:  Length: 13.8 cm. Echogenicity within normal limits. No mass or hydronephrosis visualized.  Abdominal aorta:  No aneurysm visualized.  Other findings:  None.  IMPRESSION: Gallbladder is moderately distended with sludge and borderline wall thickening. Murphy's sign is negative. Appearance is similar to prior study.   Electronically Signed   By: Burman Nieves M.D.   On: 07/19/2014 00:57    Scheduled Meds: . docusate sodium  100 mg Oral BID  . insulin aspart  0-9 Units Subcutaneous 6 times per day  . insulin glargine  15 Units Subcutaneous QHS  . magnesium sulfate 1 - 4 g bolus IVPB  3 g Intravenous Once  . pantoprazole (PROTONIX) IV  40 mg Intravenous QHS  . piperacillin-tazobactam (ZOSYN)  IV  3.375 g Intravenous 3 times per day  . sodium chloride  3 mL Intravenous Q12H   Continuous Infusions: . sodium chloride 0.9 % 1,000 mL infusion      Principal Problem:   Sepsis Active Problems:   HTN (hypertension), benign   Thrombocytopenia   Cirrhosis   Diabetes mellitus type 2, uncontrolled, with complications   Chronic renal insufficiency, stage II (mild)   Gastroenteritis   Alcohol abuse   Aspiration pneumonia   Pancreatitis   Dehydration   Abnormal finding on EKG    Time spent: 35 mins    [facility] MD [facility] Pager [phone]. If 7PM-7AM, please contact night-coverage at [email or web address], password [facility] 07/19/2014, 9:30 AM  LOS: 1 day

## 2014-07-20 LAB — BASIC METABOLIC PANEL
ANION GAP: 14 (ref 5–15)
BUN: 53 mg/dL — ABNORMAL HIGH (ref 6–23)
CO2: 19 meq/L (ref 19–32)
CREATININE: 1.57 mg/dL — AB (ref 0.50–1.35)
Calcium: 7.8 mg/dL — ABNORMAL LOW (ref 8.4–10.5)
Chloride: 101 mEq/L (ref 96–112)
GFR calc Af Amer: 57 mL/min — ABNORMAL LOW (ref >90)
GFR calc non Af Amer: 49 mL/min — ABNORMAL LOW (ref >90)
GLUCOSE: 167 mg/dL — AB (ref 70–99)
Potassium: 3.6 mEq/L — ABNORMAL LOW (ref 3.7–5.3)
SODIUM: 134 meq/L — AB (ref 137–147)

## 2014-07-20 LAB — URINE CULTURE
CULTURE: NO GROWTH
Colony Count: NO GROWTH

## 2014-07-20 LAB — CBC
HEMATOCRIT: 29.2 % — AB (ref 39.0–52.0)
HEMOGLOBIN: 9.9 g/dL — AB (ref 13.0–17.0)
MCH: 28.4 pg (ref 26.0–34.0)
MCHC: 33.9 g/dL (ref 30.0–36.0)
MCV: 83.9 fL (ref 78.0–100.0)
Platelets: 53 10*3/uL — ABNORMAL LOW (ref 150–400)
RBC: 3.48 MIL/uL — AB (ref 4.22–5.81)
RDW: 14.2 % (ref 11.5–15.5)
WBC: 4.1 10*3/uL (ref 4.0–10.5)

## 2014-07-20 LAB — GLUCOSE, CAPILLARY
GLUCOSE-CAPILLARY: 148 mg/dL — AB (ref 70–99)
GLUCOSE-CAPILLARY: 241 mg/dL — AB (ref 70–99)
Glucose-Capillary: 144 mg/dL — ABNORMAL HIGH (ref 70–99)
Glucose-Capillary: 153 mg/dL — ABNORMAL HIGH (ref 70–99)
Glucose-Capillary: 173 mg/dL — ABNORMAL HIGH (ref 70–99)
Glucose-Capillary: 198 mg/dL — ABNORMAL HIGH (ref 70–99)

## 2014-07-20 LAB — CREATININE, URINE, RANDOM: CREATININE, URINE: 168.2 mg/dL

## 2014-07-20 LAB — HEPATIC FUNCTION PANEL
ALT: 50 U/L (ref 0–53)
AST: 50 U/L — ABNORMAL HIGH (ref 0–37)
Albumin: 2.2 g/dL — ABNORMAL LOW (ref 3.5–5.2)
Alkaline Phosphatase: 98 U/L (ref 39–117)
Bilirubin, Direct: 6.6 mg/dL — ABNORMAL HIGH (ref 0.0–0.3)
Indirect Bilirubin: 0.6 mg/dL (ref 0.3–0.9)
Total Bilirubin: 7.2 mg/dL — ABNORMAL HIGH (ref 0.3–1.2)
Total Protein: 5.1 g/dL — ABNORMAL LOW (ref 6.0–8.3)

## 2014-07-20 LAB — LIPASE, BLOOD: Lipase: 114 U/L — ABNORMAL HIGH (ref 11–59)

## 2014-07-20 LAB — SODIUM, URINE, RANDOM: SODIUM UR: 16 meq/L

## 2014-07-20 LAB — MAGNESIUM: Magnesium: 2.3 mg/dL (ref 1.5–2.5)

## 2014-07-20 MED ORDER — CARVEDILOL 3.125 MG PO TABS
3.1250 mg | ORAL_TABLET | Freq: Two times a day (BID) | ORAL | Status: DC
  Administered 2014-07-20 – 2014-07-24 (×7): 3.125 mg via ORAL
  Filled 2014-07-20 (×10): qty 1

## 2014-07-20 NOTE — Progress Notes (Signed)
[facility] PROGRESS NOTE  [name]:[id number] DOB: [birth year] DOA: 07/18/2014 PCP: [name], MD  Assessment/Plan: #1 sepsis likely secondary to pneumonia and probable GI source Patient currently afebrile with clinical improvement. Blood cultures preliminary with GNR. pending. Urine cultures pending. Sputum Gram stain and cultures are pending. WBC is normalized. Continue empiric IV Zosyn. Follow.  #2 probable gallstone pancreatitis Lipase levels on admission was 679. Lipase now at 114. Clinical improvement and tolerated liquids yesterday. LFTs trending down. Total bilirubin however is elevated at 7.2. Abdominal ultrasound with a moderately distended gallbladder with sludge and borderline wall thickening similar to prior study. Murphy sign is negative. CT of the abdomen and pelvis with changes of hepatic cirrhosis and evidence of portal venous hypertension including splenic vein varices and splenic enlargement. Cholelithiasis. Fatty atrophy of the pancreas. Patient had a HIDA scan done on 07/04/2014 with no evidence of cystic duct obstruction. Continue IV fluids. Ice chips. Advance diet. Supportive care. GI ff and appreciate input and rxcs.    #3 probable aspiration pneumonia versus community-acquired pneumonia Patient had an episode of nausea and vomiting concerning for aspiration pneumonia. Chest x-ray and CT scan consistent with probable pneumonia. Patient is currently afebrile. Blood cultures prelim with GNR. Sputum Gram stain and cultures pending. Continue empiric IV Zosyn. Follow.  #4 abnormal EKG/elevated troponin/Cardiomyopathy EKG with T wave inversion in leads 1 and aVL. Patient did present with epigastric pain however denies any chest pain. Elevated cardiac enzymes. 2 d echo with cardiomyopathy and EF= 35-40%, with bicuspid AV with mild stenosis and mild regurgitation. Continue ASA.  Start low dose coreg per cardiology rxcs. Cardiology ff.   #5 type 2  diabetes Hemoglobin A1c 7.4 06/26/2014. CBGs have ranged from 150 to 01/20/2007. Continue current dose of Lantus. Sliding scale insulin.  #6 acute on chronic kidney disease stage II Renal function trending down. Urine sodium=16, and urine creatinine = 168.2. FeNa= 0.14%. Decrease IVF. Follow.  #7 chronic thrombocytopenia Likely secondary to alcohol abuse. No evidence of bleeding. Follow.  #8 dehydration Improved. Decrease IVF.  #9 hypertension Stable. Lisinopril on hold.  #10 prophylaxis PPI for GI prophylaxis. SCDs for DVT prophylaxis.  Code Status: Full Family Communication: Updated patient no family present. Disposition Plan: Home when medically stable.   Consultants:  GI: Dr Leone Payor 07/19/14  Cardiology: Dr Ronelle Nigh 07/19/14  Procedures:  CT abdomen and pelvis 07/19/2014  Chest x-ray 07/18/2014  Abdominal ultrasound 07/19/2014  Antibiotics:  IV Zosyn 07/19/2014  HPI/Subjective: Patient states abdominal pain has improved. Patient denies any nausea or vomiting. Patient tolerated liquids yesterday.  Objective: Filed Vitals:   07/20/14 0953  BP: 141/57  Pulse: 75  Temp: 99.5 F (37.5 C)  Resp: 20    Intake/Output Summary (Last 24 hours) at 07/20/14 1026 Last data filed at 07/20/14 0953  Gross per 24 hour  Intake   2230 ml  Output    402 ml  Net   1828 ml   Filed Weights   07/19/14 0209  Weight: 105.688 kg (233 lb)    Exam:   General:  Slightly jaundiced  Cardiovascular: RRR  Respiratory: Bibasilar crackles.  Abdomen: Soft, NTTP, positive bowel sounds, obese, no rebound, no guarding.  Musculoskeletal: No clubbing no cyanosis. Trace bilateral lower extremity edema.  Data Reviewed: Basic Metabolic Panel:  Recent Labs Lab 07/18/14 1647 07/18/14 2142 07/19/14 0451 07/20/14 0500  NA 135 133* 135* 134*  K 4.6 4.2 3.8 3.6*  CL 100 97 100 101  CO2 24 21 21  19  GLUCOSE 193* 209* 200* 167*  BUN 54* 56* 59* 53*  CREATININE 1.87* 1.71* 1.92*  1.57*  CALCIUM 8.3* 8.5 7.9* 7.8*  MG  --   --  1.5 2.3  PHOS  --   --  3.6  --    Liver Function Tests:  Recent Labs Lab 07/18/14 1647 07/18/14 2142 07/19/14 0451 07/19/14 1344 07/20/14 0500  AST 91* 83* 61* 55* 50*  ALT 87* 83* 66* 59* 50  ALKPHOS 113 124* 105 101 98  BILITOT 7.9* 7.4* 7.0* 7.3* 7.2*  PROT 5.4* 5.9* 5.3* 5.3* 5.1*  ALBUMIN 3.2* 2.8* 2.4* 2.3* 2.2*    Recent Labs Lab 07/18/14 1647 07/18/14 2142 07/19/14 1344 07/20/14 0500  LIPASE 679* 518* 147* 114*    Recent Labs Lab 07/18/14 2356  AMMONIA 37   CBC:  Recent Labs Lab 07/18/14 1647 07/18/14 2142 07/19/14 0451 07/20/14 0500  WBC 10.6* 8.7 5.9 4.1  NEUTROABS 10.0* 8.1*  --   --   HGB 11.3* 11.2* 10.4* 9.9*  HCT 32.2* 33.5* 31.2* 29.2*  MCV 82.8 85.0 85.7 83.9  PLT 57* 52* 49* 53*   Cardiac Enzymes:  Recent Labs Lab 07/19/14 0805 07/19/14 1344 07/19/14 1942  TROPONINI 2.19* 2.26* 2.11*   BNP (last 3 results) No results found for this basename: PROBNP,  in the last 8760 hours CBG:  Recent Labs Lab 07/19/14 1212 07/19/14 1703 07/19/14 2041 07/20/14 0022 07/20/14 0724  GLUCAP 170* 154* 183* 148* 144*    Recent Results (from the past 240 hour(s))  CULTURE, BLOOD (ROUTINE X 2)     Status: None   Collection Time    07/18/14 11:45 PM      Result Value Ref Range Status   Specimen Description BLOOD LEFT ANTECUBITAL   Final   Special Requests BOTTLES DRAWN AEROBIC AND ANAEROBIC [facility] EACH   Final   Culture  Setup Time     Final   Value: 07/19/2014 03:43     Performed at [facility]   Culture     Final   Value: ESCHERICHIA COLI     Note: Gram Stain Report Called to,Read Back By and Verified With: JENNY BURNS 07/19/14 @ 9:49PM BY RUSCOE A.     Performed at [facility]   Report Status PENDING   Incomplete     Studies: Ct Abdomen Pelvis Wo Contrast  07/19/2014   CLINICAL DATA:  Fever, epigastric pain, and vomiting.  EXAM: CT ABDOMEN AND PELVIS WITHOUT CONTRAST   TECHNIQUE: Multidetector CT imaging of the abdomen and pelvis was performed following the standard protocol without IV contrast.  COMPARISON:  10/02/2011  FINDINGS: Small left pleural effusion with basilar atelectasis or consolidation. Mild cardiac enlargement and small pericardial effusion. Coronary artery calcifications.  Enlarged lateral segment left lobe of liver and nodular liver contour compatible with cirrhosis. Spleen is enlarged. Epigastric and splenic vein varices. Prominent celiac axis and porta hepatis lymph nodes are probably reactive and associated with cirrhosis. Small amount of free fluid in the upper abdomen around the liver likely due to ascites. Gallbladder is mildly distended with small stones present. Pancreas is atrophic. Small duodenum diverticulum containing contrast material. No bile duct dilatation. Adrenal glands, abdominal aorta, inferior vena cava, and retroperitoneal lymph nodes are unremarkable. Sub cm hyper dense focus in the mid pole of the right kidney probably represents small hemorrhagic cyst. No hydronephrosis in the kidneys. Stomach and small bowel are decompressed. Contrast material flows through to the colon without evidence of bowel  obstruction. Colon is decompressed with scattered stool. No free air in the abdomen.  Pelvis: Mild thickening of the bladder wall. No free or loculated pelvic fluid collections. Appendix is not identified. No evidence of diverticulitis. No pelvic mass or lymphadenopathy. Degenerative changes in the spine and hips. No evidence of destructive bone lesion.  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 Cholelithiasis. Small amount of ascites. Fatty atrophy of the pancreas. Bladder wall thickening may indicate cystitis or bladder hypertrophy. Left pleural effusion with consolidation or atelectasis in the left lung base, possibly pneumonia.   Electronically  Signed   By: Burman NievesWilliam  Stevens M.D.   On: 07/19/2014 01:24   Dg Chest 2 View  07/18/2014   CLINICAL DATA:  Vomiting, short of breath and chest pain  EXAM: CHEST  2 VIEW  COMPARISON:  Prior chest x-ray 02/19/2013  FINDINGS: Interval development of lingular opacity obscuring into the cardiac apex. No definite pleural effusion visible on the lateral view. Cardiac and mediastinal contours are unchanged. The right lung remains well aerated. No acute osseous abnormality. Degenerative change in the bilateral acromioclavicular joints.  IMPRESSION: New opacity in the lingula obscuring the left cardiac apex may reflect atelectasis or infiltrate.   Electronically Signed   By: Malachy MoanHeath  McCullough M.D.   On: 07/18/2014 17:50   [place] Abdomen Complete  07/19/2014   CLINICAL DATA:  Cirrhosis and fever.  EXAM: ULTRASOUND ABDOMEN COMPLETE  COMPARISON:  06/27/2014  FINDINGS: Gallbladder:  Gallbladder is moderately distended with sludge and focal tumefactive sludge demonstrated. No discrete stones. Appearance is similar prior study. Borderline wall thickening. Murphy's sign is negative.  Common bile duct:  Diameter: 7 mm, normal  Liver:  No focal lesion identified. Within normal limits in parenchymal echogenicity.  IVC:  No abnormality visualized.  Pancreas:  Visualized portion unremarkable.  Spleen:  Size and appearance within normal limits.  Right Kidney:  Length: 13.2 cm. Echogenicity within normal limits. No mass or hydronephrosis visualized.  Left Kidney:  Length: 13.8 cm. Echogenicity within normal limits. No mass or hydronephrosis visualized.  Abdominal aorta:  No aneurysm visualized.  Other findings:  None.  IMPRESSION: Gallbladder is moderately distended with sludge and borderline wall thickening. Murphy's sign is negative. Appearance is similar to prior study.   Electronically Signed   By: Burman NievesWilliam  Stevens M.D.   On: 07/19/2014 00:57    Scheduled Meds: . aspirin  81 mg Oral Daily  . docusate sodium  100 mg Oral BID  .  insulin aspart  0-9 Units Subcutaneous 6 times per day  . insulin glargine  15 Units Subcutaneous QHS  . pantoprazole (PROTONIX) IV  40 mg Intravenous QHS  . piperacillin-tazobactam (ZOSYN)  IV  3.375 g Intravenous 3 times per day  . sodium chloride  3 mL Intravenous Q12H   Continuous Infusions: . sodium chloride 0.9 % 1,000 mL infusion 100 mL/hr at 07/19/14 1421    Principal Problem:   Sepsis Active Problems:   Pancreatitis   Cardiomyopathy   Elevated troponin   HTN (hypertension), benign   Thrombocytopenia   Cirrhosis   Diabetes mellitus type 2, uncontrolled, with complications   Chronic renal insufficiency, stage II (mild)   Gastroenteritis   Alcohol abuse   Aspiration pneumonia   Dehydration   Abnormal finding on EKG   Ascites    Time spent: 35 mins    [facility] Surgical Centre Of MarylandHOMPSON,Thanvi Blincoe MD [facility] Pager [phone]. If 7PM-7AM, please contact night-coverage at [email or web address], password  [email or web address] 07/20/2014, 10:26 AM  LOS: 2 days

## 2014-07-20 NOTE — Progress Notes (Signed)
Nutrition Brief Note  Patient identified on the Malnutrition Screening Tool (MST) Report  Patient with vomiting, decreased PO intake on Thursday due to sepsis/PNA. He reports that he is hungry now and eating well. Diet advanced to Heart Healthy today. Weight has been stable.   Wt Readings from Last 15 Encounters:  07/19/14 233 lb (105.688 kg)  07/18/14 233 lb (105.688 kg)  07/04/14 235 lb (106.595 kg)  06/26/14 243 lb (110.224 kg)  04/14/14 245 lb (111.131 kg)  04/03/14 238 lb (107.956 kg)  02/21/14 235 lb (106.595 kg)  12/25/13 229 lb (103.874 kg)  10/07/13 219 lb (99.338 kg)  09/06/13 220 lb (99.791 kg)  08/23/13 238 lb (107.956 kg)  05/02/13 239 lb 8 oz (108.636 kg)  04/04/13 231 lb 4 oz (104.894 kg)  03/08/13 231 lb (104.781 kg)  02/21/13 220 lb (99.791 kg)    Body mass index is 33.43 kg/(m^2). Patient meets criteria for obesity, class I based on current BMI.   Current diet order is Heart Healthy, patient is consuming approximately 100% of meals at this time. Labs and medications reviewed.   No nutrition interventions warranted at this time. If nutrition issues arise, please consult RD.   Linnell Fulling, RD, LDN Pager #: [phone] After-Hours Pager #: [phone]

## 2014-07-20 NOTE — Progress Notes (Signed)
    Progress Note   Subjective  feels good today, up in chair   Objective   Vital signs in last 24 hours: Temp:  [98.3 F (36.8 C)-99.7 F (37.6 C)] 99.7 F (37.6 C) (08/02 0420) Pulse Rate:  [70-85] 83 (08/02 0420) Resp:  [18-20] 20 (08/02 0420) BP: (112-143)/(53-67) 138/63 mmHg (08/02 0420) SpO2:  [95 %-98 %] 95 % (08/02 0420) Last BM Date: 07/18/14 General:    Pleasant white male in NAD Heart:  Regular rate and rhythm Lungs: Respirations even and unlabored, decreased breath sound and some inspiratory crackles at bilateral bases.  Abdomen:  Soft, nontender and nondistended. Normal bowel sounds. Extremities:  Without edema. Neurologic:  Alert and oriented,  grossly normal neurologically. Psych:  Cooperative. Normal mood and affect.  Lab Results:  Recent Labs  07/18/14 2142 07/19/14 0451 07/20/14 0500  WBC 8.7 5.9 4.1  HGB 11.2* 10.4* 9.9*  HCT 33.5* 31.2* 29.2*  PLT 52* 49* 53*   BMET  Recent Labs  07/18/14 2142 07/19/14 0451 07/20/14 0500  NA 133* 135* 134*  K 4.2 3.8 3.6*  CL 97 100 101  CO2 21 21 19   GLUCOSE 209* 200* 167*  BUN 56* 59* 53*  CREATININE 1.71* 1.92* 1.57*  CALCIUM 8.5 7.9* 7.8*   LFT  Recent Labs  07/20/14 0500  PROT 5.1*  ALBUMIN 2.2*  AST 50*  ALT 50  ALKPHOS 98  BILITOT 7.2*  BILIDIR 6.6*  IBILI 0.6   PT/INR  Recent Labs  07/18/14 2345  LABPROT 16.4*  INR 1.32      Assessment / Plan:    5. 53 year old male with acute epigastric pain, nausea, vomiting,fevers and biochemical pancreatitis. This could be biliary pancreatitis. No biliary duct dilation on imaging but bilrubin still elevated (predominantly direct). Needs eventual MRCP to get better look at bile ducts, as for now he is feeling better.   2. Sepsis, presumabbly from PNA. One of his blood culture contains GNR. On Zosyn. Only small volume ascites so doubt SBP as source of fever / sepsis. Temp now running in 99 range. He looks / feels okay   3. Cirrhosis,  complicated by portal HTN. ?ETOH related , ?NASH. Repeat viral hepatitis studies negative. ANA, ASMA negative in 2012. He appears compensated with exception of elevated bilirubin which may be from passed stone  .  4. Troponin elevated at 2.19 with EKG changes. Cardiology feels this is from demand ischemia related to ?sepsis / pancreatitis.  5,  AKI. Continue IVF. Creatinine improving, down to 1.57.     LOS: 2 days   Willette Cluster  07/20/2014, 9:21 AM   [facility] Attending  I have also seen and assessed the patient and agree with the above note. He is better overall Bili rise likely from sepsis, hypotension. PNA likely cause of sepsis - does not seem to have cholecystitis  Will follow along. Hep B and A immunity testing orderd in anticipation of vaccines at later date  Iva Boop, MD, [facility] [phone] (pager) 07/20/2014 2:48 PM

## 2014-07-20 NOTE — Progress Notes (Signed)
Chaplain paged at [phone] with a request to provide Christian Communion to [name]. When Chaplain arrived he was told by [name] that he is [employer] and had missed Mass due to being in the hospital. A message for the on-call [employer] from [facility] was left along with the unit telephone number should the [place] need such. Follow up by chaplain later in the day to ensure that [name] received communion.  Benjie Karvonen. Aiyla Baucom, DMin, MDiv, MA Chaplain

## 2014-07-20 NOTE — Progress Notes (Signed)
Subjective:  C/o cough overnight, no SOB or chest pain  Objective:  Vital Signs in the last 24 hours: BP 138/63  Pulse 83  Temp(Src) 99.7 F (37.6 C) (Oral)  Resp 20  Ht 5\' 10"  (1.778 m)  Wt 105.688 kg (233 lb)  BMI 33.43 kg/m2  SpO2 95%  Physical Exam: Obese WM coughing in bed Lungs:  Clear Cardiac:  Regular rhythm, normal S1 and S2, no S3, 1-2/6 systolic murmur Abdomen:  Soft, nontender, no masses Extremities: legs thin and brown  Intake/Output from previous day: 08/01 0701 - 08/02 0700 In: 1990 [P.O.:440; I.V.:1550] Out: 402 [Urine:401; Stool:1]  Weight Filed Weights   07/19/14 0209  Weight: 105.688 kg (233 lb)    Lab Results: Basic Metabolic Panel:  Recent Labs  03/70/48 0451 07/20/14 0500  NA 135* 134*  K 3.8 3.6*  CL 100 101  CO2 21 19  GLUCOSE 200* 167*  BUN 59* 53*  CREATININE 1.92* 1.57*   CBC:  Recent Labs  07/18/14 1647 07/18/14 2142 07/19/14 0451 07/20/14 0500  WBC 10.6* 8.7 5.9 4.1  NEUTROABS 10.0* 8.1*  --   --   HGB 11.3* 11.2* 10.4* 9.9*  HCT 32.2* 33.5* 31.2* 29.2*  MCV 82.8 85.0 85.7 83.9  PLT 57* 52* 49* 53*   Cardiac Enzymes:  Recent Labs  07/19/14 0805 07/19/14 1344 07/19/14 1942  TROPONINI 2.19* 2.26* 2.11*    Telemetry: Sinus  Assessment/Plan:  1. Elevated troponin likely due to supply demand mismatch ?sepsis, pancreatitits 2. Bicuspid aortic valve 3. Cirrhosis 4. Proteinuria  Rec:  Continue to treat sepsis at this time.  No need for heparin.  I don't think this is an MI.    Darden Palmer  MD [facility]  07/20/2014, 8:15 AM

## 2014-07-20 NOTE — Progress Notes (Signed)
Chaplain follow up while rounding.  As of 1500 the on call priest has not responded. However, [name]'s sister brought a host that a neighbor brought from attending mass this morning in [facility] [name] is satisfied he has received communion for this week. He is still open to a priest coming and "officially" providing communion.  Request Chaplains check on this patient as opportunity presents itself.  Corah Willeford D Corneisha Alvi, DMin Chaplain

## 2014-07-21 ENCOUNTER — Inpatient Hospital Stay (HOSPITAL_COMMUNITY): Payer: 59

## 2014-07-21 ENCOUNTER — Ambulatory Visit: Payer: 59 | Admitting: Family Medicine

## 2014-07-21 DIAGNOSIS — A4151 Sepsis due to Escherichia coli [E. coli]: Principal | ICD-10-CM

## 2014-07-21 DIAGNOSIS — F1010 Alcohol abuse, uncomplicated: Secondary | ICD-10-CM

## 2014-07-21 DIAGNOSIS — A498 Other bacterial infections of unspecified site: Secondary | ICD-10-CM

## 2014-07-21 DIAGNOSIS — R7881 Bacteremia: Secondary | ICD-10-CM | POA: Diagnosis present

## 2014-07-21 LAB — LEGIONELLA ANTIGEN, URINE: Legionella Antigen, Urine: NEGATIVE

## 2014-07-21 LAB — COMPREHENSIVE METABOLIC PANEL
ALT: 46 U/L (ref 0–53)
AST: 52 U/L — AB (ref 0–37)
Albumin: 2.1 g/dL — ABNORMAL LOW (ref 3.5–5.2)
Alkaline Phosphatase: 116 U/L (ref 39–117)
Anion gap: 13 (ref 5–15)
BUN: 44 mg/dL — ABNORMAL HIGH (ref 6–23)
CALCIUM: 8.1 mg/dL — AB (ref 8.4–10.5)
CO2: 20 meq/L (ref 19–32)
CREATININE: 1.4 mg/dL — AB (ref 0.50–1.35)
Chloride: 103 mEq/L (ref 96–112)
GFR calc non Af Amer: 57 mL/min — ABNORMAL LOW (ref >90)
GFR, EST AFRICAN AMERICAN: 66 mL/min — AB (ref >90)
GLUCOSE: 134 mg/dL — AB (ref 70–99)
Potassium: 3.5 mEq/L — ABNORMAL LOW (ref 3.7–5.3)
Sodium: 136 mEq/L — ABNORMAL LOW (ref 137–147)
TOTAL PROTEIN: 4.9 g/dL — AB (ref 6.0–8.3)
Total Bilirubin: 6.9 mg/dL — ABNORMAL HIGH (ref 0.3–1.2)

## 2014-07-21 LAB — CBC
HCT: 28.3 % — ABNORMAL LOW (ref 39.0–52.0)
Hemoglobin: 9.6 g/dL — ABNORMAL LOW (ref 13.0–17.0)
MCH: 28.7 pg (ref 26.0–34.0)
MCHC: 33.9 g/dL (ref 30.0–36.0)
MCV: 84.5 fL (ref 78.0–100.0)
Platelets: 46 10*3/uL — ABNORMAL LOW (ref 150–400)
RBC: 3.35 MIL/uL — AB (ref 4.22–5.81)
RDW: 14.3 % (ref 11.5–15.5)
WBC: 3.4 10*3/uL — AB (ref 4.0–10.5)

## 2014-07-21 LAB — CULTURE, BLOOD (ROUTINE X 2)

## 2014-07-21 LAB — HEPATITIS B SURFACE ANTIBODY,QUALITATIVE: Hep B S Ab: NEGATIVE

## 2014-07-21 LAB — HEPATITIS B CORE ANTIBODY, TOTAL: HEP B C TOTAL AB: NONREACTIVE

## 2014-07-21 LAB — GLUCOSE, CAPILLARY
GLUCOSE-CAPILLARY: 136 mg/dL — AB (ref 70–99)
GLUCOSE-CAPILLARY: 160 mg/dL — AB (ref 70–99)
GLUCOSE-CAPILLARY: 172 mg/dL — AB (ref 70–99)
Glucose-Capillary: 158 mg/dL — ABNORMAL HIGH (ref 70–99)
Glucose-Capillary: 131 mg/dL — ABNORMAL HIGH (ref 70–99)
Glucose-Capillary: 123 mg/dL — ABNORMAL HIGH (ref 70–99)
Glucose-Capillary: 191 mg/dL — ABNORMAL HIGH (ref 70–99)

## 2014-07-21 LAB — LIPASE, BLOOD: LIPASE: 136 U/L — AB (ref 11–59)

## 2014-07-21 LAB — HEPATITIS A ANTIBODY, TOTAL: HEP A TOTAL AB: NONREACTIVE

## 2014-07-21 MED ORDER — LEVOFLOXACIN 500 MG PO TABS
500.0000 mg | ORAL_TABLET | Freq: Every day | ORAL | Status: DC
  Administered 2014-07-21 – 2014-07-24 (×3): 500 mg via ORAL
  Filled 2014-07-21 (×4): qty 1

## 2014-07-21 MED ORDER — HYDROCOD POLST-CHLORPHEN POLST 10-8 MG/5ML PO LQCR
5.0000 mL | Freq: Once | ORAL | Status: AC
  Administered 2014-07-21: 5 mL via ORAL
  Filled 2014-07-21: qty 5

## 2014-07-21 MED ORDER — POTASSIUM CHLORIDE CRYS ER 20 MEQ PO TBCR
40.0000 meq | EXTENDED_RELEASE_TABLET | Freq: Once | ORAL | Status: AC
  Administered 2014-07-21: 40 meq via ORAL
  Filled 2014-07-21: qty 2

## 2014-07-21 MED ORDER — GADOBENATE DIMEGLUMINE 529 MG/ML IV SOLN
20.0000 mL | Freq: Once | INTRAVENOUS | Status: AC | PRN
  Administered 2014-07-21: 20 mL via INTRAVENOUS

## 2014-07-21 NOTE — Progress Notes (Signed)
[facility] PROGRESS NOTE  [name] ZOX:[id number] DOB: 1961-07-26 DOA: 07/18/2014 PCP: [name], MD  Assessment/Plan: #1 sepsis likely secondary to pneumonia and probable EColi bacteremia Patient currently afebrile with clinical improvement. Blood cultures 1/2 with EColi. preliminary with GNR. pending. Urine cultures negative. Sputum Gram stain and cultures are pending. WBC is normalized. Change IV Zosyn to oral levaquin and treat for total 2 weeks. Discussed with ID. Follow.  #2 probable gallstone pancreatitis Lipase levels on admission was 679. Lipase now at 114. Clinical improvement and tolerated liquids yesterday. LFTs trending down. Total bilirubin however is elevated at 7.2. Abdominal ultrasound with a moderately distended gallbladder with sludge and borderline wall thickening similar to prior study. Murphy sign is negative. CT of the abdomen and pelvis with changes of hepatic cirrhosis and evidence of portal venous hypertension including splenic vein varices and splenic enlargement. Cholelithiasis. Fatty atrophy of the pancreas. Patient had a HIDA scan done on 07/04/2014 with no evidence of cystic duct obstruction. NSL IV fluids. Ice chips. Patient tolerating solid diet. MRCP today per GI.  Supportive care. GI ff and appreciate input and rxcs.    #3 probable aspiration pneumonia versus community-acquired pneumonia Patient had an episode of nausea and vomiting concerning for aspiration pneumonia. Chest x-ray and CT scan consistent with probable pneumonia. Patient is currently afebrile. Blood cultures 1/2 + ECOLI pansensitive.  Sputum Gram stain and cultures pending. D/C IV Zosyn. Start levaquin and treat for total of 2 weeks. Follow.  #4 Prob EColi Bacteremia 1/2 blood cx positive for EColi, pansensitive. Change IV Zosyn to oral levaquin and treat for total of 2 weeks. Discussed with ID.  #5 abnormal EKG/elevated troponin/Cardiomyopathy EKG with T wave inversion in leads  1 and aVL. Patient did present with epigastric pain however denies any chest pain. Elevated cardiac enzymes. 2 d echo with cardiomyopathy and EF= 35-40%, with bicuspid AV with mild stenosis and mild regurgitation. Continue ASA.  Continue coreg per cardiology rxcs. Cardiology ff.   #6 type 2 diabetes Hemoglobin A1c 7.4 06/26/2014. CBGs have ranged from 136-172. Continue current dose of Lantus. Sliding scale insulin.  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 Urine sodium=16, and urine creatinine = 168.2. FeNa= 0.14%. NSL IVF. Follow.  #8 chronic thrombocytopenia Likely secondary to alcohol abuse. No evidence of bleeding. Follow.  #9 dehydration Improved. NSL IVF.  #10 hypertension Stable. Lisinopril on hold.  #11 prophylaxis PPI for GI prophylaxis. SCDs for DVT prophylaxis.  Code Status: Full Family Communication: Updated patient no family present. Disposition Plan: Home when medically stable.   Consultants:  GI: Dr Leone Payor 07/19/14  Cardiology: Dr Ronelle Nigh 07/19/14  Procedures:  CT abdomen and pelvis 07/19/2014  Chest x-ray 07/18/2014  Abdominal ultrasound 07/19/2014  Antibiotics:  IV Zosyn 07/19/2014>>>>07/21/14  Oral levaquin 07/21/14  HPI/Subjective: Patient states abdominal pain has improved. Patient denies any nausea or vomiting. Patient tolerated solid diet.  Objective: Filed Vitals:   07/21/14 0403  BP: 124/64  Pulse: 69  Temp: 98.5 F (36.9 C)  Resp: 24    Intake/Output Summary (Last 24 hours) at 07/21/14 1022 Last data filed at 07/21/14 0235  Gross per 24 hour  Intake   1405 ml  Output    400 ml  Net   1005 ml   Filed Weights   07/19/14 0209  Weight: 105.688 kg (233 lb)    Exam:   General:  Slightly jaundiced  Cardiovascular: RRR  Respiratory: Bibasilar crackles.  Abdomen: Soft, NTTP, positive bowel  sounds, obese, no rebound, no guarding.  Musculoskeletal: No clubbing no cyanosis. Trace bilateral lower  extremity edema.  Data Reviewed: Basic Metabolic Panel:  Recent Labs Lab 07/18/14 1647 07/18/14 2142 07/19/14 0451 07/20/14 0500 07/21/14 0427  NA 135 133* 135* 134* 136*  K 4.6 4.2 3.8 3.6* 3.5*  CL 100 97 100 101 103  CO2 24 21 21 19 20   GLUCOSE 193* 209* 200* 167* 134*  BUN 54* 56* 59* 53* 44*  CREATININE 1.87* 1.71* 1.92* 1.57* 1.40*  CALCIUM 8.3* 8.5 7.9* 7.8* 8.1*  MG  --   --  1.5 2.3  --   PHOS  --   --  3.6  --   --    Liver Function Tests:  Recent Labs Lab 07/18/14 2142 07/19/14 0451 07/19/14 1344 07/20/14 0500 07/21/14 0427  AST 83* 61* 55* 50* 52*  ALT 83* 66* 59* 50 46  ALKPHOS 124* 105 101 98 116  BILITOT 7.4* 7.0* 7.3* 7.2* 6.9*  PROT 5.9* 5.3* 5.3* 5.1* 4.9*  ALBUMIN 2.8* 2.4* 2.3* 2.2* 2.1*    Recent Labs Lab 07/18/14 1647 07/18/14 2142 07/19/14 1344 07/20/14 0500 07/21/14 0427  LIPASE 679* 518* 147* 114* 136*    Recent Labs Lab 07/18/14 2356  AMMONIA 37   CBC:  Recent Labs Lab 07/18/14 1647 07/18/14 2142 07/19/14 0451 07/20/14 0500 07/21/14 0427  WBC 10.6* 8.7 5.9 4.1 3.4*  NEUTROABS 10.0* 8.1*  --   --   --   HGB 11.3* 11.2* 10.4* 9.9* 9.6*  HCT 32.2* 33.5* 31.2* 29.2* 28.3*  MCV 82.8 85.0 85.7 83.9 84.5  PLT 57* 52* 49* 53* 46*   Cardiac Enzymes:  Recent Labs Lab 07/19/14 0805 07/19/14 1344 07/19/14 1942  TROPONINI 2.19* 2.26* 2.11*   BNP (last 3 results) No results found for this basename: PROBNP,  in the last 8760 hours CBG:  Recent Labs Lab 07/20/14 1634 07/20/14 1955 07/20/14 2335 07/21/14 0401 07/21/14 0725  GLUCAP 241* 173* 153* 131* 123*    Recent Results (from the past 240 hour(s))  CULTURE, BLOOD (ROUTINE X 2)     Status: None   Collection Time    07/18/14 11:45 PM      Result Value Ref Range Status   Specimen Description BLOOD LEFT ANTECUBITAL   Final   Special Requests BOTTLES DRAWN AEROBIC AND ANAEROBIC [facility] EACH   Final   Culture  Setup Time     Final   Value: 07/19/2014 03:43      Performed at [facility]   Culture     Final   Value: ESCHERICHIA COLI     Note: Gram Stain Report Called to,Read Back By and Verified With: JENNY BURNS 07/19/14 @ 9:49PM BY RUSCOE A.     Performed at [facility]   Report Status 07/21/2014 FINAL   Final   Organism ID, Bacteria ESCHERICHIA COLI   Final  CULTURE, BLOOD (ROUTINE X 2)     Status: None   Collection Time    07/18/14 11:56 PM      Result Value Ref Range Status   Specimen Description BLOOD LEFT HAND   Final   Special Requests BOTTLES DRAWN AEROBIC AND ANAEROBIC 6 C EACH   Final   Culture  Setup Time     Final   Value: 07/19/2014 03:42     Performed at [facility]   Culture     Final   Value:        BLOOD CULTURE  RECEIVED NO GROWTH TO DATE CULTURE WILL BE HELD FOR 5 DAYS BEFORE ISSUING A FINAL NEGATIVE REPORT     Performed at [facility]   Report Status PENDING   Incomplete  URINE CULTURE     Status: None   Collection Time    07/19/14  8:23 AM      Result Value Ref Range Status   Specimen Description URINE, CLEAN CATCH   Final   Special Requests NONE   Final   Culture  Setup Time     Final   Value: 07/19/2014 15:18     Performed at [facility] Count     Final   Value: NO GROWTH     Performed at [facility]   Culture     Final   Value: NO GROWTH     Performed at [facility]   Report Status 07/20/2014 FINAL   Final     Studies: No results found.  Scheduled Meds: . aspirin  81 mg Oral Daily  . carvedilol  3.125 mg Oral BID WC  . docusate sodium  100 mg Oral BID  . insulin aspart  0-9 Units Subcutaneous 6 times per day  . insulin glargine  15 Units Subcutaneous QHS  . pantoprazole (PROTONIX) IV  40 mg Intravenous QHS  . piperacillin-tazobactam (ZOSYN)  IV  3.375 g Intravenous 3 times per day  . sodium chloride  3 mL Intravenous Q12H   Continuous Infusions:    Principal Problem:   Sepsis Active Problems:   Pancreatitis   Cardiomyopathy    Elevated troponin   Bacteremia due to Escherichia coli   HTN (hypertension), benign   Thrombocytopenia   Cirrhosis   Diabetes mellitus type 2, uncontrolled, with complications   Chronic renal insufficiency, stage II (mild)   Gastroenteritis   Alcohol abuse   Aspiration pneumonia   Dehydration   Abnormal finding on EKG   Ascites    Time spent: 35 mins    [facility] MD [facility] Pager [phone]. If 7PM-7AM, please contact night-coverage at [email or web address], password [facility] 07/21/2014, 10:22 AM  LOS: 3 days

## 2014-07-21 NOTE — Progress Notes (Signed)
Patient seen, examined, and I agree with the above documentation, including the assessment and plan. Agree with MRCP to exclude CBD stone/obstruction, high risk for pancreatitis with gallstones Continue pneumonia treatment

## 2014-07-21 NOTE — Telephone Encounter (Signed)
Patient currently in hospital.  I canceled appointment I made for pt at GI on 07/22/14 @ 2pm.  We will call them back when patient gets out of hospital.

## 2014-07-21 NOTE — Progress Notes (Signed)
Patient ID: [name], male   DOB: July 06, 1961, 53 y.o.   MRN: [id number] [facility] Progress Note  Subjective: Feels pretty good, sitting in chair, some cough, no SOB Denies abdominal pain, but had severe epigastric pain N/V for24 hours prior to admit  Objective:  Vital signs in last 24 hours: Temp:  [98.5 F (36.9 C)-99.5 F (37.5 C)] 98.5 F (36.9 C) (08/03 0403) Pulse Rate:  [68-75] 69 (08/03 0403) Resp:  [19-24] 24 (08/03 0403) BP: (124-160)/(57-83) 124/64 mmHg (08/03 0403) SpO2:  [94 %-97 %] 94 % (08/03 0403) Last BM Date: 07/19/14 General:   Alert,  Well-developed,obeseWM    in NAD Heart:  Regular rate and rhythm; no murmurs Pulm;bilateral rhonchi Abdomen:  Soft, nontender and nondistended. Normal bowel sounds, without guarding, and without rebound.   Extremities:  Without edema. Neurologic:  Alert and  oriented x4;  grossly normal neurologically. Psych:  Alert and cooperative. Normal mood and affect.  Intake/Output from previous day: 08/02 0701 - 08/03 0700 In: 1645 [P.O.:720; I.V.:825; IV Piggyback:100] Out: 400 [Urine:400] Intake/Output this shift:    Lab Results:  Recent Labs  07/19/14 0451 07/20/14 0500 07/21/14 0427  WBC 5.9 4.1 3.4*  HGB 10.4* 9.9* 9.6*  HCT 31.2* 29.2* 28.3*  PLT 49* 53* 46*   BMET  Recent Labs  07/19/14 0451 07/20/14 0500 07/21/14 0427  NA 135* 134* 136*  K 3.8 3.6* 3.5*  CL 100 101 103  CO2 21 19 20   GLUCOSE 200* 167* 134*  BUN 59* 53* 44*  CREATININE 1.92* 1.57* 1.40*  CALCIUM 7.9* 7.8* 8.1*   LFT  Recent Labs  07/20/14 0500 07/21/14 0427  PROT 5.1* 4.9*  ALBUMIN 2.2* 2.1*  AST 50* 52*  ALT 50 46  ALKPHOS 98 116  BILITOT 7.2* 6.9*  BILIDIR 6.6*  --   IBILI 0.6  --    PT/INR  Recent Labs  07/18/14 2345  LABPROT 16.4*  INR 1.32   Hepatitis Panel  Recent Labs  07/19/14 0451  HEPBSAG NEGATIVE  HCVAB NEGATIVE  HEPAIGM NON REACTIVE  HEPBIGM NON REACTIVE    Assessment / Plan: #1  52 yomale with Cirrhosis-NASH/?ETOH #2 Acute illness with epigastric pain,nausea and vomiting  With elevated lipase, and elevated LFT's-hyperbilirubinemia-gallstones but no ductal  dilation on non contrasted CT..believe MRCP indicated-will order #3Pneumonia- On Zosyn, ? Aspiration #4 AODM #5 CRI Principal Problem:   Sepsis Active Problems:   HTN (hypertension), benign   Thrombocytopenia   Cirrhosis   Diabetes mellitus type 2, uncontrolled, with complications   Chronic renal insufficiency, stage II (mild)   Gastroenteritis   Alcohol abuse   Aspiration pneumonia   Pancreatitis   Dehydration   Abnormal finding on EKG   Ascites   Cardiomyopathy   Elevated troponin     LOS: 3 days   [name]  07/21/2014, 9:51 AM

## 2014-07-22 ENCOUNTER — Encounter (HOSPITAL_COMMUNITY): Payer: Self-pay | Admitting: General Surgery

## 2014-07-22 ENCOUNTER — Ambulatory Visit: Payer: 59 | Admitting: Internal Medicine

## 2014-07-22 DIAGNOSIS — R7402 Elevation of levels of lactic acid dehydrogenase (LDH): Secondary | ICD-10-CM

## 2014-07-22 DIAGNOSIS — I255 Ischemic cardiomyopathy: Secondary | ICD-10-CM

## 2014-07-22 DIAGNOSIS — K7460 Unspecified cirrhosis of liver: Secondary | ICD-10-CM

## 2014-07-22 DIAGNOSIS — K8309 Other cholangitis: Secondary | ICD-10-CM

## 2014-07-22 DIAGNOSIS — K8020 Calculus of gallbladder without cholecystitis without obstruction: Secondary | ICD-10-CM

## 2014-07-22 DIAGNOSIS — R740 Nonspecific elevation of levels of transaminase and lactic acid dehydrogenase [LDH]: Secondary | ICD-10-CM

## 2014-07-22 DIAGNOSIS — R1013 Epigastric pain: Secondary | ICD-10-CM

## 2014-07-22 HISTORY — DX: Ischemic cardiomyopathy: I25.5

## 2014-07-22 LAB — GLUCOSE, CAPILLARY
GLUCOSE-CAPILLARY: 115 mg/dL — AB (ref 70–99)
GLUCOSE-CAPILLARY: 210 mg/dL — AB (ref 70–99)
GLUCOSE-CAPILLARY: 161 mg/dL — AB (ref 70–99)
Glucose-Capillary: 122 mg/dL — ABNORMAL HIGH (ref 70–99)

## 2014-07-22 LAB — COMPREHENSIVE METABOLIC PANEL
ALK PHOS: 161 U/L — AB (ref 39–117)
ALT: 46 U/L (ref 0–53)
ANION GAP: 12 (ref 5–15)
AST: 50 U/L — AB (ref 0–37)
Albumin: 2 g/dL — ABNORMAL LOW (ref 3.5–5.2)
BUN: 45 mg/dL — AB (ref 6–23)
CO2: 20 mEq/L (ref 19–32)
Calcium: 8.4 mg/dL (ref 8.4–10.5)
Chloride: 102 mEq/L (ref 96–112)
Creatinine, Ser: 1.33 mg/dL (ref 0.50–1.35)
GFR calc Af Amer: 70 mL/min — ABNORMAL LOW (ref >90)
GFR calc non Af Amer: 60 mL/min — ABNORMAL LOW (ref >90)
Glucose, Bld: 126 mg/dL — ABNORMAL HIGH (ref 70–99)
POTASSIUM: 3.8 meq/L (ref 3.7–5.3)
SODIUM: 134 meq/L — AB (ref 137–147)
TOTAL PROTEIN: 5.2 g/dL — AB (ref 6.0–8.3)
Total Bilirubin: 6.7 mg/dL — ABNORMAL HIGH (ref 0.3–1.2)

## 2014-07-22 LAB — CBC
HCT: 29.8 % — ABNORMAL LOW (ref 39.0–52.0)
HEMOGLOBIN: 9.8 g/dL — AB (ref 13.0–17.0)
MCH: 27.9 pg (ref 26.0–34.0)
MCHC: 32.9 g/dL (ref 30.0–36.0)
MCV: 84.9 fL (ref 78.0–100.0)
Platelets: 38 10*3/uL — ABNORMAL LOW (ref 150–400)
RBC: 3.51 MIL/uL — ABNORMAL LOW (ref 4.22–5.81)
RDW: 14.5 % (ref 11.5–15.5)
WBC: 4.8 10*3/uL (ref 4.0–10.5)

## 2014-07-22 NOTE — Progress Notes (Signed)
[facility] PROGRESS NOTE  Ralph BuckerRobert A Walker ZOX:[id number] DOB: January 17, 1961 DOA: 07/18/2014 PCP: Jeoffrey MassedMCGOWEN,PHILIP H, MD  Assessment/Plan: #1 sepsis likely secondary to pneumonia and probable EColi bacteremia Patient currently afebrile with clinical improvement. Blood cultures 1/2 with EColi. Urine cultures negative. Sputum Gram stain and cultures are pending. WBC is normalized. Continue oral levaquin and treat for total 2 weeks. Discussed with ID. Follow.  #2 probable gallstone pancreatitis Lipase levels on admission was 679. Lipase now at 114. Clinical improvement and tolerated liquids yesterday. LFTs trending down. Total bilirubin however is elevated at 7.2. Abdominal ultrasound with a moderately distended gallbladder with sludge and borderline wall thickening similar to prior study. Murphy sign is negative. CT of the abdomen and pelvis with changes of hepatic cirrhosis and evidence of portal venous hypertension including splenic vein varices and splenic enlargement. Cholelithiasis. Fatty atrophy of the pancreas. Patient had a HIDA scan done on 07/04/2014 with no evidence of cystic duct obstruction. NSL IV fluids. Patient tolerating solid diet. MRCP with cirrhotic liver, no ductal dilatation, no pancreatitis, gallbladder with tiny stones with no wall thickening. Patient has been seen by GI and recommend a general surgical consultation for possible cholecystectomy. GI ff and appreciate input and rxcs.    #3 probable aspiration pneumonia versus community-acquired pneumonia Patient had an episode of nausea and vomiting concerning for aspiration pneumonia. Chest x-ray and CT scan consistent with probable pneumonia. Patient is currently afebrile. Blood cultures 1/2 + ECOLI pansensitive.  Sputum Gram stain and cultures pending. Continue oral levaquin and treat for total of 2 weeks. Follow.  #4 Prob EColi Bacteremia 1/2 blood cx positive for EColi, pansensitive. Continue oral levaquin and treat for total  of 2 weeks. Discussed with ID.  #5 abnormal EKG/elevated troponin/Cardiomyopathy EKG with T wave inversion in leads 1 and aVL. Patient did present with epigastric pain however denies any chest pain. Elevated cardiac enzymes. 2 d echo with cardiomyopathy and EF= 35-40%, with bicuspid AV with mild stenosis and mild regurgitation. Continue ASA.  Continue coreg per cardiology rxcs. Cardiology to determine ischemic workup. Cardiology ff.   #6 type 2 diabetes Hemoglobin A1c 7.4 06/26/2014. CBGs have ranged from 115-210. Continue current dose of Lantus. Sliding scale insulin.  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 Urine sodium=16, and urine creatinine = 168.2. FeNa= 0.14%. NSL IVF. Follow urine output. Follow.  #8 chronic thrombocytopenia Likely secondary to alcohol abuse. No evidence of bleeding. Follow.  #9 dehydration Improved. NSL IVF.  #10 hypertension Stable. Lisinopril on hold.  #11 prophylaxis PPI for GI prophylaxis. SCDs for DVT prophylaxis.  Code Status: Full Family Communication: Updated patient no family present. Disposition Plan: Home when medically stable.   Consultants:  GI: Dr Leone PayorGessner 07/19/14  Cardiology: Dr Ronelle NighMaclean 07/19/14  Procedures:  CT abdomen and pelvis 07/19/2014  Chest x-ray 07/18/2014  Abdominal ultrasound 07/19/2014  Antibiotics:  IV Zosyn 07/19/2014>>>>07/21/14  Oral levaquin 07/21/14  HPI/Subjective: Patient states abdominal pain has improved. Patient denies any nausea or vomiting. Patient tolerated solid diet. No complaints.  Objective: Filed Vitals:   07/22/14 0447  BP: 139/78  Pulse: 64  Temp: 98.1 F (36.7 C)  Resp: 20    Intake/Output Summary (Last 24 hours) at 07/22/14 1044 Last data filed at 07/22/14 0900  Gross per 24 hour  Intake    480 ml  Output      0 ml  Net    480 ml   Filed Weights   07/19/14 0209  Weight: 105.688 kg (  233 lb)    Exam:   General:  Slightly  jaundiced  Cardiovascular: RRR  Respiratory: Bibasilar crackles.  Abdomen: Soft, NTTP, positive bowel sounds, obese, no rebound, no guarding.  Musculoskeletal: No clubbing no cyanosis. 1 + bilateral lower extremity edema.  Data Reviewed: Basic Metabolic Panel:  Recent Labs Lab 07/18/14 2142 07/19/14 0451 07/20/14 0500 07/21/14 0427 07/22/14 0455  NA 133* 135* 134* 136* 134*  K 4.2 3.8 3.6* 3.5* 3.8  CL 97 100 101 103 102  CO2 21 21 19 20 20   GLUCOSE 209* 200* 167* 134* 126*  BUN 56* 59* 53* 44* 45*  CREATININE 1.71* 1.92* 1.57* 1.40* 1.33  CALCIUM 8.5 7.9* 7.8* 8.1* 8.4  MG  --  1.5 2.3  --   --   PHOS  --  3.6  --   --   --    Liver Function Tests:  Recent Labs Lab 07/19/14 0451 07/19/14 1344 07/20/14 0500 07/21/14 0427 07/22/14 0455  AST 61* 55* 50* 52* 50*  ALT 66* 59* 50 46 46  ALKPHOS 105 101 98 116 161*  BILITOT 7.0* 7.3* 7.2* 6.9* 6.7*  PROT 5.3* 5.3* 5.1* 4.9* 5.2*  ALBUMIN 2.4* 2.3* 2.2* 2.1* 2.0*    Recent Labs Lab 07/18/14 1647 07/18/14 2142 07/19/14 1344 07/20/14 0500 07/21/14 0427  LIPASE 679* 518* 147* 114* 136*    Recent Labs Lab 07/18/14 2356  AMMONIA 37   CBC:  Recent Labs Lab 07/18/14 1647 07/18/14 2142 07/19/14 0451 07/20/14 0500 07/21/14 0427 07/22/14 0455  WBC 10.6* 8.7 5.9 4.1 3.4* 4.8  NEUTROABS 10.0* 8.1*  --   --   --   --   HGB 11.3* 11.2* 10.4* 9.9* 9.6* 9.8*  HCT 32.2* 33.5* 31.2* 29.2* 28.3* 29.8*  MCV 82.8 85.0 85.7 83.9 84.5 84.9  PLT 57* 52* 49* 53* 46* 38*   Cardiac Enzymes:  Recent Labs Lab 07/19/14 0805 07/19/14 1344 07/19/14 1942  TROPONINI 2.19* 2.26* 2.11*   BNP (last 3 results) No results found for this basename: PROBNP,  in the last 8760 hours CBG:  Recent Labs Lab 07/21/14 1633 07/21/14 1958 07/21/14 2346 07/22/14 0403 07/22/14 0807  GLUCAP 136* 191* 160* 122* 115*    Recent Results (from the past 240 hour(s))  CULTURE, BLOOD (ROUTINE X 2)     Status: None   Collection  Time    07/18/14 11:45 PM      Result Value Ref Range Status   Specimen Description BLOOD LEFT ANTECUBITAL   Final   Special Requests BOTTLES DRAWN AEROBIC AND ANAEROBIC [facility] EACH   Final   Culture  Setup Time     Final   Value: 07/19/2014 03:43     Performed at [facility]   Culture     Final   Value: ESCHERICHIA COLI     Note: Gram Stain Report Called to,Read Back By and Verified With: JENNY BURNS 07/19/14 @ 9:49PM BY RUSCOE A.     Performed at [facility]   Report Status 07/21/2014 FINAL   Final   Organism ID, Bacteria ESCHERICHIA COLI   Final  CULTURE, BLOOD (ROUTINE X 2)     Status: None   Collection Time    07/18/14 11:56 PM      Result Value Ref Range Status   Specimen Description BLOOD LEFT HAND   Final   Special Requests BOTTLES DRAWN AEROBIC AND ANAEROBIC 6 C EACH   Final   Culture  Setup Time  Final   Value: 07/19/2014 03:42     Performed at [facility]   Culture     Final   Value:        BLOOD CULTURE RECEIVED NO GROWTH TO DATE CULTURE WILL BE HELD FOR 5 DAYS BEFORE ISSUING A FINAL NEGATIVE REPORT     Performed at [facility]   Report Status PENDING   Incomplete  URINE CULTURE     Status: None   Collection Time    07/19/14  8:23 AM      Result Value Ref Range Status   Specimen Description URINE, CLEAN CATCH   Final   Special Requests NONE   Final   Culture  Setup Time     Final   Value: 07/19/2014 15:18     Performed at [facility] Count     Final   Value: NO GROWTH     Performed at [facility]   Culture     Final   Value: NO GROWTH     Performed at [facility]   Report Status 07/20/2014 FINAL   Final     Studies: Mr 3d Recon At Scanner  July 25, 2014   CLINICAL DATA:  Epigastric pain, nausea and vomiting. Elevated lipase. Elevated liver function tests.  EXAM: MRI ABDOMEN WITHOUT  (INCLUDING MRCP)  TECHNIQUE: Multiplanar multisequence MR imaging of the abdomen was performed. Heavily  T2-weighted images of the biliary and pancreatic ducts were obtained, and three-dimensional MRCP images were rendered by post processing.  COMPARISON:  CT of the abdomen and pelvis 07/19/2014. Abdominal ultrasound 07/19/2014.  FINDINGS: The liver has a shrunken appearance and markedly nodular contour, compatible with cirrhosis. There is a lace-like pattern of T2 hyperintensity throughout the hepatic parenchyma, most significantly within the right lobe of the liver, compatible with areas of hepatic fibrosis. No discrete hypervascular hepatic lesion is identified on postcontrast images to suggest a hepatocellular carcinoma at this time. MRCP images demonstrate no intra or extrahepatic biliary ductal dilatation. Tiny signal voids are present in the dependent portion of the gallbladder, compatible with tiny gallstones. Gallbladder appears moderately distended, but gallbladder wall thickness is normal and there is no pericholecystic fluid or surrounding inflammatory changes to suggest an acute cholecystitis at this time. The pancreatic duct is also normal in caliber. The pancreas also appears grossly normal in appearance. Specifically, pancreatic parenchyma enhances uniformly. No pancreatic or peripancreatic fluid collections to suggest the presence of a pseudocyst at this time.  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 The spleen is enlarged measuring 14.3 x 8.2 x 14.9 cm (estimated splenic volume of 874 mL). There are multiple renal lesions bilaterally which demonstrate low T1 signal intensity, high T2 signal intensity, and do not enhance, compatible with simple cysts, with the largest simple cyst measuring 1.3 cm in the anterior aspect of the interpolar region of the left kidney. There are other small renal lesions that demonstrate high T1 signal intensity, low T2 signal intensity, and do not enhance, compatible with proteinaceous or hemorrhagic cysts, largest of which measures 12 mm in the interpolar  region of the right kidney. No other suspicious appearing renal lesions are noted. In addition, in the upper pole of the right kidney there is a 2.8 cm calyceal diverticulum.  IMPRESSION: 1. No intra or extrahepatic biliary ductal dilatation to suggest biliary tract obstruction at this time. 2. Cholelithiasis.  No evidence of acute cholecystitis at this time. 3. Morphologic changes in  the liver compatible with cirrhosis with early hepatic fibrosis. There is also dilatation of the portal vein and splenomegaly, indicative of portal hypertension. 4. No acute abnormality of the pancreas. Specifically, no evidence of pancreatic necrosis, and no evidence of pancreatic inflammation or pseudocysts. 5. Additional incidental findings, as above.   Electronically Signed   By: Trudie Reed M.D.   On: 07/21/2014 15:18   Mr Jorja Loa Cm/mrcp  07/21/2014   CLINICAL DATA:  Epigastric pain, nausea and vomiting. Elevated lipase. Elevated liver function tests.  EXAM: MRI ABDOMEN WITHOUT  (INCLUDING MRCP)  TECHNIQUE: Multiplanar multisequence MR imaging of the abdomen was performed. Heavily T2-weighted images of the biliary and pancreatic ducts were obtained, and three-dimensional MRCP images were rendered by post processing.  COMPARISON:  CT of the abdomen and pelvis 07/19/2014. Abdominal ultrasound 07/19/2014.  FINDINGS: The liver has a shrunken appearance and markedly nodular contour, compatible with cirrhosis. There is a lace-like pattern of T2 hyperintensity throughout the hepatic parenchyma, most significantly within the right lobe of the liver, compatible with areas of hepatic fibrosis. No discrete hypervascular hepatic lesion is identified on postcontrast images to suggest a hepatocellular carcinoma at this time. MRCP images demonstrate no intra or extrahepatic biliary ductal dilatation. Tiny signal voids are present in the dependent portion of the gallbladder, compatible with tiny gallstones. Gallbladder appears moderately  distended, but gallbladder wall thickness is normal and there is no pericholecystic fluid or surrounding inflammatory changes to suggest an acute cholecystitis at this time. The pancreatic duct is also normal in caliber. The pancreas also appears grossly normal in appearance. Specifically, pancreatic parenchyma enhances uniformly. No pancreatic or peripancreatic fluid collections to suggest the presence of a pseudocyst at this time.  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 The spleen is enlarged measuring 14.3 x 8.2 x 14.9 cm (estimated splenic volume of 874 mL). There are multiple renal lesions bilaterally which demonstrate low T1 signal intensity, high T2 signal intensity, and do not enhance, compatible with simple cysts, with the largest simple cyst measuring 1.3 cm in the anterior aspect of the interpolar region of the left kidney. There are other small renal lesions that demonstrate high T1 signal intensity, low T2 signal intensity, and do not enhance, compatible with proteinaceous or hemorrhagic cysts, largest of which measures 12 mm in the interpolar region of the right kidney. No other suspicious appearing renal lesions are noted. In addition, in the upper pole of the right kidney there is a 2.8 cm calyceal diverticulum.  IMPRESSION: 1. No intra or extrahepatic biliary ductal dilatation to suggest biliary tract obstruction at this time. 2. Cholelithiasis.  No evidence of acute cholecystitis at this time. 3. Morphologic changes in the liver compatible with cirrhosis with early hepatic fibrosis. There is also dilatation of the portal vein and splenomegaly, indicative of portal hypertension. 4. No acute abnormality of the pancreas. Specifically, no evidence of pancreatic necrosis, and no evidence of pancreatic inflammation or pseudocysts. 5. Additional incidental findings, as above.   Electronically Signed   By: Trudie Reed M.D.   On: 07/21/2014 15:18    Scheduled Meds: . aspirin  81 mg  Oral Daily  . carvedilol  3.125 mg Oral BID WC  . docusate sodium  100 mg Oral BID  . insulin aspart  0-9 Units Subcutaneous 6 times per day  . insulin glargine  15 Units Subcutaneous QHS  . levofloxacin  500 mg Oral Daily  . pantoprazole (PROTONIX) IV  40 mg Intravenous QHS  .  sodium chloride  3 mL Intravenous Q12H   Continuous Infusions:    Principal Problem:   Sepsis Active Problems:   Pancreatitis   Cardiomyopathy   Elevated troponin   Bacteremia due to Escherichia coli   HTN (hypertension), benign   Thrombocytopenia   Cirrhosis   Diabetes mellitus type 2, uncontrolled, with complications   Chronic renal insufficiency, stage II (mild)   Gastroenteritis   Alcohol abuse   Aspiration pneumonia   Dehydration   Abnormal finding on EKG   Ascites    Time spent: 35 mins    [facility] MD [facility] Pager [phone]. If 7PM-7AM, please contact night-coverage at [email or web address], password [facility] 07/22/2014, 10:43 AM  LOS: 4 days

## 2014-07-22 NOTE — Telephone Encounter (Signed)
Patient is being transported to [facility] main campus at [facility] for stress test to see if he can have gall bladder surgery. They are hesitant to do the surgery bc his platelet count is too low. Patient is asking what Dr. [name] opinion is.

## 2014-07-22 NOTE — Consult Note (Signed)
Reason for Consult: cholelithiasis, evaluation for cholecystectomy  Referring Physician: Dr.  Zenovia Jarred    HPI: [name] is a 53 year old male with a history of diabetes mellitus, cirrhosis, alcohol abuse(stopped 2012), peripheral neuropathy and cholelithiasis who was admitted on 07/18/14 with epigastric abdominal pain, nausea, vomiting and fevers.  He was found to have E coli bacteremia, pneumonia, dehydration and thrombocytopenia.  He also had a positive troponin and therefore cardiology was consulted.  He is scheduled for a stress test tomorrow morning at [facility].  Gastroenterology was consulted for diagnosis of pancreatitis, he was found to have a lipase of 619.  He was also found to have elevated LFTs, a CT of abdomen did not show CBD dilatation, showed gallbladder sludge.  This was followed by an MRCP which was negative for biliary ductal dilatation, showed cholelithiasis and cirrhosis, portal vein dilatation and splenomegaly indicative of portal hypertension.  We have therefore been asked to evaluate the patient for a cholecystectomy.    [name] states that back in 2013 he was hospitalized in [place] for 5 days with abdominal pain, nausea and vomiting.  He reports "passing a stone" and subsequently being discharged home.  He was doing well overall until 2-3 weeks ago at which time he once again developed epigastric abdominal pain which was associated with nausea and vomiting. This was moderate in severity.  Time pattern was intermittent.  No aggravating or alleviating factors.  No modifying factors. He was seen by his PCP and underwent a [place] of abdomen which showed sludge.  This was followed by a HIDA scan which was negative.  His symptoms are not aggravated by food.  He developed fever and chills over the weekend associated with shortness of breath.  He denies any changes in bowel pattern, diarrhea, melena.  Denies recent weight loss.  He has not drank any alcohol since 2012.  He works and is very  active a his job.  He denies chest pains or dyspnea on exertion prior to Saturday.  At present time, he denies any pain.  He is tolerating a heart healthy diet.  T bilirubin remains elevated at 6.7, but trending down.  He has not leukocytosis.  Lipase is down to 136.  INR on 07/18/14 was 1.3.  Platelet count of 38K.  He is not on any anticoagulation.  He denies previous abdominal surgeries.     Past Medical History  Diagnosis Date  . Heart murmur     ECHO 07/2011 showed mild AS and LVH.  TEE 09/2011 showed small PFO, bicuspid aortic valve but no stenosis or regurg.  . Cirrhosis, alcoholic 86/7619    with mild splenomegaly and ascites 2012/12013; has not had EGD yet (as of 08/23/13).  F/u u/s for ongoing/worsening thrombocytopenia showed no more splenomegaly.  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  Gen surg and GI recommended flagyl and cipro x 2 wks..  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  . Cholelithiasis 09/2011; 08/2013    u/s--no cholecystitis.  06/2014 u/s showed no stones, only GB sludge  . Alcoholism   . Peripheral neuropathy 2012    Diabetic:  Autonomic (pelvic) and LE polyneuropathy--[facility] testing showed that it is likely from DM  . 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 Arthritis     back  L3 - L5  . Hyperactive gag reflex   . History of pyelonephritis 09/2011  . Type II or unspecified type diabetes mellitus with neurological manifestations, not stated as uncontrolled     IDDM: neuro, renal, and ophth complications  . Wears dentures     upper  . Osteomyelitis of toe of left foot 10/22/12    left 4th and 5th toes--now s/p amputation of these areas  . Diabetic retinopathy associated with type 2 diabetes mellitus     Laser tx in both eyes  . Diabetic nephropathy 09/2013     Proteinuria and CrCl 55-65  . Chronic renal insufficiency, stage III (moderate)   . Lumbar spondylosis     MRI 08/25/11 with multilevel facet dz, small disc protrusion at L5-S1 to the right without impingement    Past Surgical History  Procedure Laterality Date  . Knee arthroscopy  2001    Bilateral  . Amputation  10/24/2012    Procedure: AMPUTATION RAY;  Surgeon: Colin Rhein, MD;  Location: [facility];  Service: Orthopedics;  Laterality: Left;  left 4th toe amputation through MTP joint, 5th Ray amputation   . Knee surgery  2001  . Hida scan  06/2014    Normal    Family History  Problem Relation Age of Onset  . Heart disease Father   . Diabetes type II Sister     Social History:  reports that he has never smoked. His smokeless tobacco use includes Chew. He reports that he does not drink alcohol or use illicit drugs.  Allergies:  Allergies  Allergen Reactions  . Hydralazine Hcl Hives  . Naproxen Nausea And Vomiting  . Other Itching and Other (See Comments)    PERFUMES - SNEEZING    Medications: Scheduled Meds: . aspirin  81 mg Oral Daily  . carvedilol  3.125 mg Oral BID WC  . docusate sodium  100 mg Oral BID  . insulin aspart  0-9 Units Subcutaneous 6 times per day  . insulin glargine  15 Units Subcutaneous QHS  . levofloxacin  500 mg Oral Daily  . pantoprazole (PROTONIX) IV  40 mg Intravenous QHS  . sodium chloride  3 mL Intravenous Q12H   Continuous Infusions:  PRN Meds:.dextrose, glucose-Vitamin C, morphine injection, ondansetron (ZOFRAN) IV, ondansetron   Results for orders placed during the hospital encounter of 07/18/14 (from the past 48 hour(s))  GLUCOSE, CAPILLARY     Status: Abnormal   Collection Time    07/20/14  4:34 PM      Result Value Ref Range   Glucose-Capillary 241 (*) 70 - 99 mg/dL  GLUCOSE, CAPILLARY     Status: Abnormal   Collection Time    07/20/14  7:55 PM      Result Value Ref Range   Glucose-Capillary 173 (*) 70 - 99  mg/dL  GLUCOSE, CAPILLARY     Status: Abnormal   Collection Time    07/20/14 11:35 PM      Result Value Ref Range   Glucose-Capillary 153 (*) 70 - 99 mg/dL  GLUCOSE, CAPILLARY     Status: Abnormal   Collection Time    07/21/14  4:01 AM      Result Value Ref Range   Glucose-Capillary 131 (*) 70 - 99 mg/dL  COMPREHENSIVE METABOLIC PANEL     Status: Abnormal   Collection Time    07/21/14  4:27 AM      Result Value Ref Range   Sodium 136 (*) 137 -  147 mEq/L   Potassium 3.5 (*) 3.7 - 5.3 mEq/L   Chloride 103  96 - 112 mEq/L   CO2 20  19 - 32 mEq/L   Glucose, Bld 134 (*) 70 - 99 mg/dL   BUN 44 (*) 6 - 23 mg/dL   Creatinine, Ser 1.40 (*) 0.50 - 1.35 mg/dL   Calcium 8.1 (*) 8.4 - 10.5 mg/dL   Total Protein 4.9 (*) 6.0 - 8.3 g/dL   Albumin 2.1 (*) 3.5 - 5.2 g/dL   AST 52 (*) 0 - 37 U/L   ALT 46  0 - 53 U/L   Alkaline Phosphatase 116  39 - 117 U/L   Total Bilirubin 6.9 (*) 0.3 - 1.2 mg/dL   GFR calc non Af Amer 57 (*) >90 mL/min   GFR calc Af Amer 66 (*) >90 mL/min   Comment: (NOTE)     The eGFR has been calculated using the CKD EPI equation.     This calculation has not been validated in all clinical situations.     eGFR's persistently <90 mL/min signify possible Chronic Kidney     Disease.   Anion gap 13  5 - 15  CBC     Status: Abnormal   Collection Time    07/21/14  4:27 AM      Result Value Ref Range   WBC 3.4 (*) 4.0 - 10.5 K/uL   RBC 3.35 (*) 4.22 - 5.81 MIL/uL   Hemoglobin 9.6 (*) 13.0 - 17.0 g/dL   HCT 28.3 (*) 39.0 - 52.0 %   MCV 84.5  78.0 - 100.0 fL   MCH 28.7  26.0 - 34.0 pg   MCHC 33.9  30.0 - 36.0 g/dL   RDW 14.3  11.5 - 15.5 %   Platelets 46 (*) 150 - 400 K/uL   Comment: REPEATED TO VERIFY     SPECIMEN CHECKED FOR CLOTS     CONSISTENT WITH PREVIOUS RESULT  LIPASE, BLOOD     Status: Abnormal   Collection Time    07/21/14  4:27 AM      Result Value Ref Range   Lipase 136 (*) 11 - 59 U/L  HEPATITIS B SURFACE ANTIBODY     Status: None   Collection Time     07/21/14  4:27 AM      Result Value Ref Range   Hep B S Ab NEGATIVE  NEGATIVE   Comment: Performed at [facility], TOTAL     Status: None   Collection Time    07/21/14  4:27 AM      Result Value Ref Range   Hep B Core Total Ab NON REACTIVE  NON REACTIVE   Comment: Performed at [facility], TOTAL     Status: None   Collection Time    07/21/14  4:27 AM      Result Value Ref Range   Hep A Total Ab NON REACTIVE  NON REACTIVE   Comment: Performed at [place], CAPILLARY     Status: Abnormal   Collection Time    07/21/14  7:25 AM      Result Value Ref Range   Glucose-Capillary 123 (*) 70 - 99 mg/dL  GLUCOSE, CAPILLARY     Status: Abnormal   Collection Time    07/21/14 11:39 AM      Result Value Ref Range   Glucose-Capillary 172 (*) 70 - 99 mg/dL  GLUCOSE,  CAPILLARY     Status: Abnormal   Collection Time    07/21/14  4:33 PM      Result Value Ref Range   Glucose-Capillary 136 (*) 70 - 99 mg/dL  GLUCOSE, CAPILLARY     Status: Abnormal   Collection Time    07/21/14  7:58 PM      Result Value Ref Range   Glucose-Capillary 191 (*) 70 - 99 mg/dL  GLUCOSE, CAPILLARY     Status: Abnormal   Collection Time    07/21/14 11:46 PM      Result Value Ref Range   Glucose-Capillary 160 (*) 70 - 99 mg/dL  GLUCOSE, CAPILLARY     Status: Abnormal   Collection Time    07/22/14  4:03 AM      Result Value Ref Range   Glucose-Capillary 122 (*) 70 - 99 mg/dL  COMPREHENSIVE METABOLIC PANEL     Status: Abnormal   Collection Time    07/22/14  4:55 AM      Result Value Ref Range   Sodium 134 (*) 137 - 147 mEq/L   Potassium 3.8  3.7 - 5.3 mEq/L   Chloride 102  96 - 112 mEq/L   CO2 20  19 - 32 mEq/L   Glucose, Bld 126 (*) 70 - 99 mg/dL   BUN 45 (*) 6 - 23 mg/dL   Creatinine, Ser 1.33  0.50 - 1.35 mg/dL   Calcium 8.4  8.4 - 10.5 mg/dL   Total Protein 5.2 (*) 6.0 - 8.3 g/dL   Albumin 2.0 (*) 3.5 - 5.2 g/dL    AST 50 (*) 0 - 37 U/L   ALT 46  0 - 53 U/L   Alkaline Phosphatase 161 (*) 39 - 117 U/L   Total Bilirubin 6.7 (*) 0.3 - 1.2 mg/dL   GFR calc non Af Amer 60 (*) >90 mL/min   GFR calc Af Amer 70 (*) >90 mL/min   Comment: (NOTE)     The eGFR has been calculated using the CKD EPI equation.     This calculation has not been validated in all clinical situations.     eGFR's persistently <90 mL/min signify possible Chronic Kidney     Disease.   Anion gap 12  5 - 15  CBC     Status: Abnormal   Collection Time    07/22/14  4:55 AM      Result Value Ref Range   WBC 4.8  4.0 - 10.5 K/uL   RBC 3.51 (*) 4.22 - 5.81 MIL/uL   Hemoglobin 9.8 (*) 13.0 - 17.0 g/dL   HCT 29.8 (*) 39.0 - 52.0 %   MCV 84.9  78.0 - 100.0 fL   MCH 27.9  26.0 - 34.0 pg   MCHC 32.9  30.0 - 36.0 g/dL   RDW 14.5  11.5 - 15.5 %   Platelets 38 (*) 150 - 400 K/uL   Comment: SPECIMEN CHECKED FOR CLOTS     REPEATED TO VERIFY     PLATELET COUNT CONFIRMED BY SMEAR  GLUCOSE, CAPILLARY     Status: Abnormal   Collection Time    07/22/14  8:07 AM      Result Value Ref Range   Glucose-Capillary 115 (*) 70 - 99 mg/dL  GLUCOSE, CAPILLARY     Status: Abnormal   Collection Time    07/22/14 12:06 PM      Result Value Ref Range   Glucose-Capillary 210 (*) 70 - 99 mg/dL  Mr 3d Recon At [facility]  07/21/2014   CLINICAL DATA:  Epigastric pain, nausea and vomiting. Elevated lipase. Elevated liver function tests.  EXAM: MRI ABDOMEN WITHOUT  (INCLUDING MRCP)  TECHNIQUE: Multiplanar multisequence MR imaging of the abdomen was performed. Heavily T2-weighted images of the biliary and pancreatic ducts were obtained, and three-dimensional MRCP images were rendered by post processing.  COMPARISON:  CT of the abdomen and pelvis 07/19/2014. Abdominal ultrasound 07/19/2014.  FINDINGS: The liver has a shrunken appearance and markedly nodular contour, compatible with cirrhosis. There is a lace-like pattern of T2 hyperintensity throughout the hepatic  parenchyma, most significantly within the right lobe of the liver, compatible with areas of hepatic fibrosis. No discrete hypervascular hepatic lesion is identified on postcontrast images to suggest a hepatocellular carcinoma at this time. MRCP images demonstrate no intra or extrahepatic biliary ductal dilatation. Tiny signal voids are present in the dependent portion of the gallbladder, compatible with tiny gallstones. Gallbladder appears moderately distended, but gallbladder wall thickness is normal and there is no pericholecystic fluid or surrounding inflammatory changes to suggest an acute cholecystitis at this time. The pancreatic duct is also normal in caliber. The pancreas also appears grossly normal in appearance. Specifically, pancreatic parenchyma enhances uniformly. No pancreatic or peripancreatic fluid collections to suggest the presence of a pseudocyst at this time.  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 The spleen is enlarged measuring 14.3 x 8.2 x 14.9 cm (estimated splenic volume of 874 mL). There are multiple renal lesions bilaterally which demonstrate low T1 signal intensity, high T2 signal intensity, and do not enhance, compatible with simple cysts, with the largest simple cyst measuring 1.3 cm in the anterior aspect of the interpolar region of the left kidney. There are other small renal lesions that demonstrate high T1 signal intensity, low T2 signal intensity, and do not enhance, compatible with proteinaceous or hemorrhagic cysts, largest of which measures 12 mm in the interpolar region of the right kidney. No other suspicious appearing renal lesions are noted. In addition, in the upper pole of the right kidney there is a 2.8 cm calyceal diverticulum.  IMPRESSION: 1. No intra or extrahepatic biliary ductal dilatation to suggest biliary tract obstruction at this time. 2. Cholelithiasis.  No evidence of acute cholecystitis at this time. 3. Morphologic changes in the liver  compatible with cirrhosis with early hepatic fibrosis. There is also dilatation of the portal vein and splenomegaly, indicative of portal hypertension. 4. No acute abnormality of the pancreas. Specifically, no evidence of pancreatic necrosis, and no evidence of pancreatic inflammation or pseudocysts. 5. Additional incidental findings, as above.   Electronically Signed   By: Vinnie Langton M.D.   On: 07/21/2014 15:18   Mr Lambert Mody Cm/mrcp  07/21/2014   CLINICAL DATA:  Epigastric pain, nausea and vomiting. Elevated lipase. Elevated liver function tests.  EXAM: MRI ABDOMEN WITHOUT  (INCLUDING MRCP)  TECHNIQUE: Multiplanar multisequence MR imaging of the abdomen was performed. Heavily T2-weighted images of the biliary and pancreatic ducts were obtained, and three-dimensional MRCP images were rendered by post processing.  COMPARISON:  CT of the abdomen and pelvis 07/19/2014. Abdominal ultrasound 07/19/2014.  FINDINGS: The liver has a shrunken appearance and markedly nodular contour, compatible with cirrhosis. There is a lace-like pattern of T2 hyperintensity throughout the hepatic parenchyma, most significantly within the right lobe of the liver, compatible with areas of hepatic fibrosis. No discrete hypervascular hepatic lesion is identified on postcontrast images to suggest a hepatocellular carcinoma at this time.  MRCP images demonstrate no intra or extrahepatic biliary ductal dilatation. Tiny signal voids are present in the dependent portion of the gallbladder, compatible with tiny gallstones. Gallbladder appears moderately distended, but gallbladder wall thickness is normal and there is no pericholecystic fluid or surrounding inflammatory changes to suggest an acute cholecystitis at this time. The pancreatic duct is also normal in caliber. The pancreas also appears grossly normal in appearance. Specifically, pancreatic parenchyma enhances uniformly. No pancreatic or peripancreatic fluid collections to suggest the  presence of a pseudocyst at this time.  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 The spleen is enlarged measuring 14.3 x 8.2 x 14.9 cm (estimated splenic volume of 874 mL). There are multiple renal lesions bilaterally which demonstrate low T1 signal intensity, high T2 signal intensity, and do not enhance, compatible with simple cysts, with the largest simple cyst measuring 1.3 cm in the anterior aspect of the interpolar region of the left kidney. There are other small renal lesions that demonstrate high T1 signal intensity, low T2 signal intensity, and do not enhance, compatible with proteinaceous or hemorrhagic cysts, largest of which measures 12 mm in the interpolar region of the right kidney. No other suspicious appearing renal lesions are noted. In addition, in the upper pole of the right kidney there is a 2.8 cm calyceal diverticulum.  IMPRESSION: 1. No intra or extrahepatic biliary ductal dilatation to suggest biliary tract obstruction at this time. 2. Cholelithiasis.  No evidence of acute cholecystitis at this time. 3. Morphologic changes in the liver compatible with cirrhosis with early hepatic fibrosis. There is also dilatation of the portal vein and splenomegaly, indicative of portal hypertension. 4. No acute abnormality of the pancreas. Specifically, no evidence of pancreatic necrosis, and no evidence of pancreatic inflammation or pseudocysts. 5. Additional incidental findings, as above.   Electronically Signed   By: Vinnie Langton M.D.   On: 07/21/2014 15:18    Review of Systems  All other systems reviewed and are negative.  Blood pressure 139/78, pulse 64, temperature 98.1 F (36.7 C), temperature source Oral, resp. rate 20, height _0  (1.778 m), weight 233 lb (105.688 kg), SpO2 95.00%. Physical Exam  Constitutional: He is oriented to person, place, and time. He appears well-developed and well-nourished. No distress.  HENT:  Head: Normocephalic and atraumatic.  Eyes:  Conjunctivae and EOM are normal. Pupils are equal, round, and reactive to light. Right eye exhibits no discharge. Left eye exhibits no discharge. Scleral icterus is present.  Neck: Normal range of motion. Neck supple.  Cardiovascular: Normal rate, regular rhythm, normal heart sounds and intact distal pulses.  Exam reveals no gallop and no friction rub.   No murmur heard. Respiratory: Effort normal and breath sounds normal. No respiratory distress. He has no wheezes. He has no rales. He exhibits no tenderness.  GI: Soft. He exhibits no distension and no mass. There is no tenderness. There is no rebound and no guarding.  Musculoskeletal: Normal range of motion.  +2 pitting BLE edema, brown discoloration, left 2 toes amputated.    Neurological: He is alert and oriented to person, place, and time.  Skin: Skin is warm and dry. No rash noted. He is not diaphoretic. No erythema. No pallor.  Psychiatric: He has a normal mood and affect. His behavior is normal. Judgment and thought content normal.    Assessment: Cirrhosis(childs class C) EColi bacteremia Pneumonia Thrombocytopenia  Diabetes mellitus Abdominal pain/n/v transaminitis Cholelithiasis Acute pancreatitis  Hx alcohol abuse Cardiomyopathy   Plan:  I will discuss with Dr. Hassell Done whether this patient is a surgical candidate given his liver and heart disease.  He has a stress test scheduled tomorrow. Further recommendations will be based on Dr. Earlie Server evaluation.  Thank you for the consult.  Surgery will continue to follow.    Erby Pian ANP-BC Pager [phone] 07/22/2014, 1:11 PM

## 2014-07-22 NOTE — Progress Notes (Signed)
SUBJECTIVE:  No chest pain.  No SOB.   PHYSICAL EXAM Filed Vitals:   07/21/14 1522 07/21/14 1727 07/21/14 1959 07/22/14 0447  BP: 128/59 128/58 143/71 139/78  Pulse: 58 60 68 64  Temp: 98.3 F (36.8 C) 98.7 F (37.1 C) 98.3 F (36.8 C) 98.1 F (36.7 C)  TempSrc: Oral Oral Oral Oral  Resp: 20 20 20 20   Height:      Weight:      SpO2: 97% 98% 97% 95%   General:  No distress Lungs:  Clear Heart:  RRR Abdomen:  Positive bowel sounds, no rebound no guarding Extremities:  Mild edema   LABS:  Results for orders placed during the hospital encounter of 07/18/14 (from the past 24 hour(s))  GLUCOSE, CAPILLARY     Status: Abnormal   Collection Time    07/21/14  4:33 PM      Result Value Ref Range   Glucose-Capillary 136 (*) 70 - 99 mg/dL  GLUCOSE, CAPILLARY     Status: Abnormal   Collection Time    07/21/14  7:58 PM      Result Value Ref Range   Glucose-Capillary 191 (*) 70 - 99 mg/dL  GLUCOSE, CAPILLARY     Status: Abnormal   Collection Time    07/21/14 11:46 PM      Result Value Ref Range   Glucose-Capillary 160 (*) 70 - 99 mg/dL  GLUCOSE, CAPILLARY     Status: Abnormal   Collection Time    07/22/14  4:03 AM      Result Value Ref Range   Glucose-Capillary 122 (*) 70 - 99 mg/dL  COMPREHENSIVE METABOLIC PANEL     Status: Abnormal   Collection Time    07/22/14  4:55 AM      Result Value Ref Range   Sodium 134 (*) 137 - 147 mEq/L   Potassium 3.8  3.7 - 5.3 mEq/L   Chloride 102  96 - 112 mEq/L   CO2 20  19 - 32 mEq/L   Glucose, Bld 126 (*) 70 - 99 mg/dL   BUN 45 (*) 6 - 23 mg/dL   Creatinine, Ser 2.441.33  0.50 - 1.35 mg/dL   Calcium 8.4  8.4 - 01.010.5 mg/dL   Total Protein 5.2 (*) 6.0 - 8.3 g/dL   Albumin 2.0 (*) 3.5 - 5.2 g/dL   AST 50 (*) 0 - 37 U/L   ALT 46  0 - 53 U/L   Alkaline Phosphatase 161 (*) 39 - 117 U/L   Total Bilirubin 6.7 (*) 0.3 - 1.2 mg/dL   GFR calc non Af Amer 60 (*) >90 mL/min   GFR calc Af Amer 70 (*) >90 mL/min   Anion gap 12  5 - 15  CBC      Status: Abnormal   Collection Time    07/22/14  4:55 AM      Result Value Ref Range   WBC 4.8  4.0 - 10.5 K/uL   RBC 3.51 (*) 4.22 - 5.81 MIL/uL   Hemoglobin 9.8 (*) 13.0 - 17.0 g/dL   HCT 27.229.8 (*) 53.639.0 - 64.452.0 %   MCV 84.9  78.0 - 100.0 fL   MCH 27.9  26.0 - 34.0 pg   MCHC 32.9  30.0 - 36.0 g/dL   RDW 03.414.5  74.211.5 - 59.515.5 %   Platelets 38 (*) 150 - 400 K/uL  GLUCOSE, CAPILLARY     Status: Abnormal   Collection Time    07/22/14  8:07 AM      Result Value Ref Range   Glucose-Capillary 115 (*) 70 - 99 mg/dL    Intake/Output Summary (Last 24 hours) at 07/22/14 1148 Last data filed at 07/22/14 0900  Gross per 24 hour  Intake    480 ml  Output      0 ml  Net    480 ml    ASSESSMENT AND PLAN:  ELEVATED TROPONIN:  Felt to be demand ischemia.  Work up as below.    BICUSPID AORTIC VALVE:  We will follow this clinically.    CARDIOMYOPATHY:  He will need eventually an ischemia work up.  We will determine the timing of this.  He would need a Lexiscan Myoview before elective surgery.  I will order this for tomorrow.   Fayrene Fearing [facility] 07/22/2014 11:48 AM

## 2014-07-22 NOTE — Progress Notes (Signed)
Pt was sitting up in chair when I arrived. He had a visit from [employer] Monday and was satisfied w/service provided. Pt said he is waiting on news from dr regarding decision on what to do. Pt talked about home and family. Had good visit. Pt wanted to know how long I'm here and said if he gets bad news from dr he will definitely have nurse call - I encouraged him to do that and told him we are available whenever he needs. Pls page [phone] as spt is needed. Marjory Lies Chaplain  07/22/14 1000  Clinical Encounter Type  Visited With Patient

## 2014-07-22 NOTE — Progress Notes (Signed)
Patient ID: [name], male   DOB: Jul 06, 1961, 53 y.o.   MRN: [id number] [facility] Progress Note  Subjective: Feeling pretty well- up walking, eating without difficulty, no c/o abdominal pain, noSOB Pt now relates being hospitalized in 2012 in [place] with severe abdominal pain,nausea/vomiting and was told at that time that he had passed a gallstone "but they were trying to avoid surgery" -he thinks Dr Boyd Kerbs has those records   MRCP- Cirrhotic liver, no ducatldilation,no pancreatitis, gallbladder with tiny stones,nowall thickening  BC+Ecoli    Objective:  Vital signs in last 24 hours: Temp:  [98.1 F (36.7 C)-98.7 F (37.1 C)] 98.1 F (36.7 C) (08/04 0447) Pulse Rate:  [58-68] 64 (08/04 0447) Resp:  [20-21] 20 (08/04 0447) BP: (128-143)/(58-78) 139/78 mmHg (08/04 0447) SpO2:  [95 %-98 %] 95 % (08/04 0447) Last BM Date: 07/20/14 General:   Alert,  Well-developed, WM   in NAD, jaundiced Heart:  Regular rate and rhythm; no murmurs Pulm;clear Abdomen:  Soft, nontender and nondistended. Normal bowel sounds, without guarding, and without rebound.   Extremities:  Without edema. Neurologic:  Alert and  oriented x4;  grossly normal neurologically. Psych:  Alert and cooperative. Normal mood and affect.  Intake/Output from previous day: 08/03 0701 - 08/04 0700 In: 360 [P.O.:360] Out: -  Intake/Output this shift:    Lab Results:  Recent Labs  07/20/14 0500 07/21/14 0427 07/22/14 0455  WBC 4.1 3.4* 4.8  HGB 9.9* 9.6* 9.8*  HCT 29.2* 28.3* 29.8*  PLT 53* 46* 38*   BMET  Recent Labs  07/20/14 0500 07/21/14 0427 07/22/14 0455  NA 134* 136* 134*  K 3.6* 3.5* 3.8  CL 101 103 102  CO2 19 20 20   GLUCOSE 167* 134* 126*  BUN 53* 44* 45*  CREATININE 1.57* 1.40* 1.33  CALCIUM 7.8* 8.1* 8.4   LFT  Recent Labs  07/20/14 0500  07/22/14 0455  PROT 5.1*  < > 5.2*  ALBUMIN 2.2*  < > 2.0*  AST 50*  < > 50*  ALT 50  < > 46  ALKPHOS 98  < > 161*   BILITOT 7.2*  < > 6.7*  BILIDIR 6.6*  --   --   IBILI 0.6  --   --   < > = values in this interval not displayed.  Assessment / Plan: #1  Pneumonia- improving-suspect aspiration #2 Ecoli- Bacteremia- likely GI source-choledocholithiasis #3 abdominal pain, nausea, vomiting, elevated lipase and LFT's - all consistent with passage of gallstone---similar episode 2012- will get records #4 Decompensated  Cirrhosis- felt NASH/ETOH-  #5 chronic thrombocytopenia #6  Cardiomyopathy #7 DM  Continue  abx- now onLevaquin Will ask surgery to see regarding  lap chole Principal Problem:   Sepsis Active Problems:   HTN (hypertension), benign   Thrombocytopenia   Cirrhosis   Diabetes mellitus type 2, uncontrolled, with complications   Chronic renal insufficiency, stage II (mild)   Gastroenteritis   Alcohol abuse   Aspiration pneumonia   Pancreatitis   Dehydration   Abnormal finding on EKG   Ascites   Cardiomyopathy   Elevated troponin   Bacteremia due to Escherichia coli     LOS: 4 days   Amy Esterwood  07/22/2014, 9:13 AM

## 2014-07-22 NOTE — Progress Notes (Signed)
Patient seen, examined, and I agree with the above documentation, including the assessment and plan. E coli bacteremia, chemical pancreatitis, elevated LFTs all consistent with CBD stone Improving clinically Agree with consideration of cholecystectomy given recurrent cholangitis with gram neg bacteremia. Lap chole does carry elevated risk given hx of cirrhosis with portal HTN, but he will remain at risk for pancreatitis and recurrent biliary sepsis associated with gallstones Cardiology clearance underway with planned stress test If cholecystectomy performed would need close monitoring from liver-standpt post-op Discussed at length with the patient

## 2014-07-23 ENCOUNTER — Ambulatory Visit (HOSPITAL_COMMUNITY)
Admission: RE | Admit: 2014-07-23 | Discharge: 2014-07-23 | Disposition: A | Discharge status: Home / Self Care | Payer: 59 | Source: Ambulatory Visit | Attending: Cardiology | Admitting: Cardiology

## 2014-07-23 ENCOUNTER — Other Ambulatory Visit: Payer: Self-pay

## 2014-07-23 ENCOUNTER — Ambulatory Visit (HOSPITAL_COMMUNITY)
Admit: 2014-07-23 | Discharge: 2014-07-23 | Disposition: A | Discharge status: Home / Self Care | Payer: 59 | Attending: Cardiology | Admitting: Cardiology

## 2014-07-23 DIAGNOSIS — R079 Chest pain, unspecified: Secondary | ICD-10-CM

## 2014-07-23 DIAGNOSIS — E1165 Type 2 diabetes mellitus with hyperglycemia: Secondary | ICD-10-CM

## 2014-07-23 DIAGNOSIS — IMO0002 Reserved for concepts with insufficient information to code with codable children: Secondary | ICD-10-CM

## 2014-07-23 DIAGNOSIS — Z8719 Personal history of other diseases of the digestive system: Secondary | ICD-10-CM

## 2014-07-23 DIAGNOSIS — E118 Type 2 diabetes mellitus with unspecified complications: Secondary | ICD-10-CM

## 2014-07-23 LAB — COMPREHENSIVE METABOLIC PANEL
ALBUMIN: 1.9 g/dL — AB (ref 3.5–5.2)
ALT: 40 U/L (ref 0–53)
AST: 45 U/L — AB (ref 0–37)
Alkaline Phosphatase: 191 U/L — ABNORMAL HIGH (ref 39–117)
Anion gap: 11 (ref 5–15)
BILIRUBIN TOTAL: 5.3 mg/dL — AB (ref 0.3–1.2)
BUN: 46 mg/dL — ABNORMAL HIGH (ref 6–23)
CO2: 20 mEq/L (ref 19–32)
Calcium: 8.3 mg/dL — ABNORMAL LOW (ref 8.4–10.5)
Chloride: 105 mEq/L (ref 96–112)
Creatinine, Ser: 1.31 mg/dL (ref 0.50–1.35)
GFR calc Af Amer: 71 mL/min — ABNORMAL LOW (ref >90)
GFR calc non Af Amer: 62 mL/min — ABNORMAL LOW (ref >90)
Glucose, Bld: 127 mg/dL — ABNORMAL HIGH (ref 70–99)
POTASSIUM: 4 meq/L (ref 3.7–5.3)
Sodium: 136 mEq/L — ABNORMAL LOW (ref 137–147)
TOTAL PROTEIN: 4.9 g/dL — AB (ref 6.0–8.3)

## 2014-07-23 LAB — CBC
HEMATOCRIT: 27.6 % — AB (ref 39.0–52.0)
HEMOGLOBIN: 9.5 g/dL — AB (ref 13.0–17.0)
MCH: 28.6 pg (ref 26.0–34.0)
MCHC: 34.4 g/dL (ref 30.0–36.0)
MCV: 83.1 fL (ref 78.0–100.0)
Platelets: 42 10*3/uL — ABNORMAL LOW (ref 150–400)
RBC: 3.32 MIL/uL — ABNORMAL LOW (ref 4.22–5.81)
RDW: 14.4 % (ref 11.5–15.5)
WBC: 3.6 10*3/uL — AB (ref 4.0–10.5)

## 2014-07-23 LAB — GLUCOSE, CAPILLARY
GLUCOSE-CAPILLARY: 113 mg/dL — AB (ref 70–99)
GLUCOSE-CAPILLARY: 163 mg/dL — AB (ref 70–99)
GLUCOSE-CAPILLARY: 168 mg/dL — AB (ref 70–99)
GLUCOSE-CAPILLARY: 177 mg/dL — AB (ref 70–99)
Glucose-Capillary: 112 mg/dL — ABNORMAL HIGH (ref 70–99)
Glucose-Capillary: 170 mg/dL — ABNORMAL HIGH (ref 70–99)
Glucose-Capillary: 175 mg/dL — ABNORMAL HIGH (ref 70–99)
Glucose-Capillary: 92 mg/dL (ref 70–99)

## 2014-07-23 MED ORDER — TECHNETIUM TC 99M SESTAMIBI GENERIC - CARDIOLITE
10.0000 | Freq: Once | INTRAVENOUS | Status: AC | PRN
  Administered 2014-07-23: 10 via INTRAVENOUS

## 2014-07-23 MED ORDER — REGADENOSON 0.4 MG/5ML IV SOLN
INTRAVENOUS | Status: AC
  Filled 2014-07-23: qty 5

## 2014-07-23 MED ORDER — FUROSEMIDE 10 MG/ML IJ SOLN
40.0000 mg | Freq: Once | INTRAMUSCULAR | Status: AC
  Administered 2014-07-23: 40 mg via INTRAVENOUS
  Filled 2014-07-23 (×2): qty 4

## 2014-07-23 MED ORDER — FUROSEMIDE 40 MG PO TABS
40.0000 mg | ORAL_TABLET | Freq: Every day | ORAL | Status: DC
  Administered 2014-07-24: 40 mg via ORAL
  Filled 2014-07-23: qty 1

## 2014-07-23 MED ORDER — SPIRONOLACTONE 100 MG PO TABS
100.0000 mg | ORAL_TABLET | Freq: Every day | ORAL | Status: DC
  Administered 2014-07-23 – 2014-07-24 (×2): 100 mg via ORAL
  Filled 2014-07-23 (×2): qty 1

## 2014-07-23 MED ORDER — REGADENOSON 0.4 MG/5ML IV SOLN
0.4000 mg | Freq: Once | INTRAVENOUS | Status: AC
  Administered 2014-07-23: 0.4 mg via INTRAVENOUS

## 2014-07-23 MED ORDER — TECHNETIUM TC 99M SESTAMIBI GENERIC - CARDIOLITE
30.0000 | Freq: Once | INTRAVENOUS | Status: AC | PRN
  Administered 2014-07-23: 30 via INTRAVENOUS

## 2014-07-23 NOTE — Progress Notes (Signed)
[facility] PROGRESS NOTE  Walker DECLEENE WLS:[id number] DOB: [birth year] DOA: 07/18/2014 PCP: [name], MD  Assessment/Plan:  Sepsis likely secondary to pneumonia and EColi bacteremia -Patient currently afebrile with clinical improvement.  -Blood cultures 1/2 with EColi. Urine cultures negative. Sputum Gram stain and cultures are pending.  -WBC is normalized. Continue oral levaquin and treat for total 2 weeks.  EColi Bacteremia -1/2 blood cx positive for EColi, pansensitive. Continue oral levaquin and treat for total of 2 weeks. -Unidentified source of the bacteremia, urine is clear, this is likely from biliary tree. -Cannot rule out acute cholangitis. -CT scans/MRCP showed only cirrhotic liver but no evidence of bile duct stones. -Patient is improving on oral levofloxacin, continue for a total 14 days.  Probable gallstone pancreatitis -Lipase levels on admission was 679. Lipase now at 114. Clinical improvement and tolerated liquids yesterday.  -LFTs trending down. Total bilirubin however is elevated at 7.2. Abdominal ultrasound with a moderately distended gallbladder with sludge and borderline wall thickening similar to prior study. Murphy sign is negative.  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 Cholelithiasis. Fatty atrophy of the pancreas.  -Patient had a HIDA scan done on 07/04/2014 with no evidence of cystic duct obstruction. NSL IV fluids. Patient tolerating solid diet. MRCP with cirrhotic liver, no ductal dilatation, no pancreatitis, gallbladder with tiny stones with no wall thickening.   Probable aspiration pneumonia versus community-acquired pneumonia Patient had an episode of nausea and vomiting concerning for aspiration pneumonia. Chest x-ray and CT scan consistent with probable pneumonia. Patient is currently afebrile. Blood cultures 1/2 +  ECOLI pansensitive.  Sputum Gram stain and cultures pending. Continue oral levaquin and treat for total of 2 weeks. Follow.  Abnormal EKG/elevated troponin/Cardiomyopathy EKG with T wave inversion in leads 1 and aVL. Patient did present with epigastric pain however denies any chest pain. Elevated cardiac enzymes. 2 d echo with cardiomyopathy and EF= 35-40%, with bicuspid AV with mild stenosis and mild regurgitation. Continue ASA.  Continue coreg per cardiology rxcs. Cardiology to determine ischemic workup. Cardiology ff.   Type 2 diabetes Hemoglobin A1c 7.4 06/26/2014. CBGs have ranged from 115-210. Continue current dose of Lantus. Sliding scale insulin.  Acute on chronic kidney disease stage II Renal function trending down. Urine sodium=16, and urine creatinine = 168.2. FeNa= 0.14%. NSL IVF. Follow urine output. Follow.  Chronic thrombocytopenia Likely secondary to alcohol abuse/cirrhosis. No evidence of bleeding. Follow.  Dehydration Improved. NSL IVF.  Hypertension Stable. Lisinopril on hold.  Prophylaxis PPI for GI prophylaxis. SCDs for DVT prophylaxis.  Code Status: Full Family Communication: Updated patient no family present. Disposition Plan: Home when medically stable.   Consultants:  GI: Dr Leone Payor 07/19/14  Cardiology: Dr Ronelle Nigh 07/19/14  Procedures:  CT abdomen and pelvis 07/19/2014  Chest x-ray 07/18/2014  Abdominal ultrasound 07/19/2014  Antibiotics:  IV Zosyn 07/19/2014>>>>07/21/14  Oral levaquin 07/21/14  HPI/Subjective: Patient states abdominal pain has improved. Patient denies any nausea or vomiting. Patient tolerated solid diet. No complaints.  Objective: Filed Vitals:   07/23/14 1410  BP: 153/67  Pulse: 61  Temp: 97.5 F (36.4 C)  Resp: 20    Intake/Output Summary (Last 24 hours) at 07/23/14 1605 Last data filed at 07/23/14 1300  Gross per 24 hour  Intake    480 ml  Output      0 ml  Net    480 ml   [employer]  07/19/14 0209   Weight: 105.688 kg (233 lb)    Exam:   General:  Slightly jaundiced  Cardiovascular: RRR  Respiratory: Bibasilar crackles.  Abdomen: Soft, NTTP, positive bowel sounds, obese, no rebound, no guarding.  Musculoskeletal: No clubbing no cyanosis. 1 + bilateral lower extremity edema.  Data Reviewed: Basic Metabolic Panel:  Recent Labs Lab 07/18/14 2142 07/19/14 0451 07/20/14 0500 07/21/14 0427 07/22/14 0455 07/23/14 0350  NA 133* 135* 134* 136* 134* 136*  K 4.2 3.8 3.6* 3.5* 3.8 4.0  CL 97 100 101 103 102 105  CO2 21 21 19 20 20 20   GLUCOSE 209* 200* 167* 134* 126* 127*  BUN 56* 59* 53* 44* 45* 46*  CREATININE 1.71* 1.92* 1.57* 1.40* 1.33 1.31  CALCIUM 8.5 7.9* 7.8* 8.1* 8.4 8.3*  MG  --  1.5 2.3  --   --   --   PHOS  --  3.6  --   --   --   --    Liver Function Tests:  Recent Labs Lab 07/19/14 1344 07/20/14 0500 07/21/14 0427 07/22/14 0455 07/23/14 0350  AST 55* 50* 52* 50* 45*  ALT 59* 50 46 46 40  ALKPHOS 101 98 116 161* 191*  BILITOT 7.3* 7.2* 6.9* 6.7* 5.3*  PROT 5.3* 5.1* 4.9* 5.2* 4.9*  ALBUMIN 2.3* 2.2* 2.1* 2.0* 1.9*    Recent Labs Lab 07/18/14 1647 07/18/14 2142 07/19/14 1344 07/20/14 0500 07/21/14 0427  LIPASE 679* 518* 147* 114* 136*    Recent Labs Lab 07/18/14 2356  AMMONIA 37   CBC:  Recent Labs Lab 07/18/14 1647 07/18/14 2142 07/19/14 0451 07/20/14 0500 07/21/14 0427 07/22/14 0455 07/23/14 0350  WBC 10.6* 8.7 5.9 4.1 3.4* 4.8 3.6*  NEUTROABS 10.0* 8.1*  --   --   --   --   --   HGB 11.3* 11.2* 10.4* 9.9* 9.6* 9.8* 9.5*  HCT 32.2* 33.5* 31.2* 29.2* 28.3* 29.8* 27.6*  MCV 82.8 85.0 85.7 83.9 84.5 84.9 83.1  PLT 57* 52* 49* 53* 46* 38* 42*   Cardiac Enzymes:  Recent Labs Lab 07/19/14 0805 07/19/14 1344 07/19/14 1942  TROPONINI 2.19* 2.26* 2.11*   BNP (last 3 results) No results found for this basename: PROBNP,  in the last 8760 hours CBG:  Recent Labs Lab 07/22/14 2027 07/23/14 0032 07/23/14 0405  07/23/14 0730 07/23/14 1202  GLUCAP 175* 163* 113* 92 177*    Recent Results (from the past 240 hour(s))  CULTURE, BLOOD (ROUTINE X 2)     Status: None   Collection Time    07/18/14 11:45 PM      Result Value Ref Range Status   Specimen Description BLOOD LEFT ANTECUBITAL   Final   Special Requests BOTTLES DRAWN AEROBIC AND ANAEROBIC [facility] EACH   Final   Culture  Setup Time     Final   Value: 07/19/2014 03:43     [facility]   Culture     Final   Value: ESCHERICHIA COLI     Note: Gram Stain Report Called to,Read Back By and Verified With: JENNY BURNS 07/19/14 @ 9:49PM BY RUSCOE A.     Performed at [facility]   Report Status 07/21/2014 FINAL   Final   Organism ID, Bacteria ESCHERICHIA COLI   Final  CULTURE, BLOOD (ROUTINE X 2)     Status: None   Collection Time    07/18/14 11:56 PM      Result Value Ref Range Status  Specimen Description BLOOD LEFT HAND   Final   Special Requests BOTTLES DRAWN AEROBIC AND ANAEROBIC 6 C EACH   Final   Culture  Setup Time     Final   Value: 07/19/2014 03:42     Performed at [facility]   Culture     Final   Value:        BLOOD CULTURE RECEIVED NO GROWTH TO DATE CULTURE WILL BE HELD FOR 5 DAYS BEFORE ISSUING A FINAL NEGATIVE REPORT     Performed at [facility]   Report Status PENDING   Incomplete  URINE CULTURE     Status: None   Collection Time    07/19/14  8:23 AM      Result Value Ref Range Status   Specimen Description URINE, CLEAN CATCH   Final   Special Requests NONE   Final   Culture  Setup Time     Final   Value: 07/19/2014 15:18     Performed at [facility] Count     Final   Value: NO GROWTH     Performed at [facility]   Culture     Final   Value: NO GROWTH     Performed at [facility]   Report Status 07/20/2014 FINAL   Final     Studies: Nm Myocar Multi W/spect W/wall Motion / Ef  07/23/2014   CLINICAL DATA:  Chest pain  EXAM: MYOCARDIAL  IMAGING WITH SPECT (REST AND PHARMACOLOGIC-STRESS)  GATED LEFT VENTRICULAR WALL MOTION STUDY  LEFT VENTRICULAR EJECTION FRACTION  TECHNIQUE: Standard myocardial SPECT imaging was performed after resting intravenous injection of 10 mCi Tc-5m sestamibi. Subsequently, intravenous infusion of Lexiscan was performed under the supervision of the Cardiology staff. At peak effect of the drug, 30 mCi Tc-66m sestamibi was injected intravenously and standard myocardial SPECT imaging was performed. Quantitative gated imaging was also performed to evaluate left ventricular wall motion, and estimate left ventricular ejection fraction.  COMPARISON:  None.  FINDINGS: SPECT: There is a large fixed perfusion defect involving the inferior wall and apex. No definite stress-induced perfusion defect.  Wall motion:  Global hypokinesis  Ejection fraction: 19%. End-diastolic volume 343 cc. End systolic volume 278 cc.  IMPRESSION: No stress-induced ischemia.  Fixed defect involving the inferior wall and apex compatible with scar.  Global hypokinesis and a low ejection fraction at 19%.   Electronically Signed   By: Maryclare Bean M.D.   On: 07/23/2014 12:30    Scheduled Meds: . aspirin  81 mg Oral Daily  . carvedilol  3.125 mg Oral BID WC  . docusate sodium  100 mg Oral BID  . insulin aspart  0-9 Units Subcutaneous 6 times per day  . insulin glargine  15 Units Subcutaneous QHS  . levofloxacin  500 mg Oral Daily  . pantoprazole (PROTONIX) IV  40 mg Intravenous QHS  . sodium chloride  3 mL Intravenous Q12H   Continuous Infusions:    Principal Problem:   Sepsis Active Problems:   HTN (hypertension), benign   Thrombocytopenia   Cirrhosis   Diabetes mellitus type 2, uncontrolled, with complications   Chronic renal insufficiency, stage II (mild)   Gastroenteritis   Alcohol abuse   Aspiration pneumonia   Pancreatitis   Dehydration   Abnormal finding on EKG   Ascites   Cardiomyopathy   Elevated troponin   Bacteremia due  to Escherichia coli    Time spent: 35 mins  [facility] A MD [facility] Pager [phone]. If 7PM-7AM, please contact night-coverage at [email or web address], password [email or web address] 07/23/2014, 4:05 PM  LOS: 5 days

## 2014-07-23 NOTE — Telephone Encounter (Signed)
Please advise 

## 2014-07-23 NOTE — Progress Notes (Signed)
   Attempted twice to see the patient this AM but he was in the restroom.  He is going to [facility] for a stress perfusion study.  He will be evaluated there.

## 2014-07-23 NOTE — Progress Notes (Signed)
Patient ID: [name], male   DOB: 1961-09-04, 53 y.o.   MRN: [id number]    [address] [place], [place] [address]   Phone: [phone] FAX: [phone]     Subjective: No pain.  Tolerating solids.  No leukocytosis, afebrile.  Tbili trending down.   Objective:  Vital signs:  Filed Vitals:   07/22/14 0447 07/22/14 1336 07/22/14 2024 07/23/14 0622  BP: 139/78 138/64 143/71 137/66  Pulse: 64 54 63 63  Temp: 98.1 F (36.7 C) 97.6 F (36.4 C) 97.7 F (36.5 C) 98.2 F (36.8 C)  TempSrc: Oral Oral Oral Oral  Resp: 20 20 19 18   Height:      Weight:      SpO2: 95% 98% 99% 97%    Last BM Date: 07/21/14  Intake/Output   Yesterday:  08/04 0701 - 08/05 0700 In: 480 [P.O.:480] Out: -  This shift:    I/O last 3 completed shifts: In: 64 [P.O.:720] Out: -     Physical Exam: General: Pt awake/alert/oriented x4 in no acute distress Eyes: icterus Abdomen: Soft.  Nondistended. nontender.  No evidence of peritonitis.  No incarcerated hernias. Ext:  SCDs BLE.  Mild BLE pitting edema.  No cyanosis   Problem List:   Principal Problem:   Sepsis Active Problems:   HTN (hypertension), benign   Thrombocytopenia   Cirrhosis   Diabetes mellitus type 2, uncontrolled, with complications   Chronic renal insufficiency, stage II (mild)   Gastroenteritis   Alcohol abuse   Aspiration pneumonia   Pancreatitis   Dehydration   Abnormal finding on EKG   Ascites   Cardiomyopathy   Elevated troponin   Bacteremia due to Escherichia coli    Results:   Labs: Results for orders placed during the hospital encounter of 07/18/14 (from the past 48 hour(s))  GLUCOSE, CAPILLARY     Status: Abnormal   Collection Time    07/21/14 11:39 AM      Result Value Ref Range   Glucose-Capillary 172 (*) 70 - 99 mg/dL  GLUCOSE, CAPILLARY     Status: Abnormal   Collection Time    07/21/14  4:33 PM      Result Value Ref Range    Glucose-Capillary 136 (*) 70 - 99 mg/dL  GLUCOSE, CAPILLARY     Status: Abnormal   Collection Time    07/21/14  7:58 PM      Result Value Ref Range   Glucose-Capillary 191 (*) 70 - 99 mg/dL  GLUCOSE, CAPILLARY     Status: Abnormal   Collection Time    07/21/14 11:46 PM      Result Value Ref Range   Glucose-Capillary 160 (*) 70 - 99 mg/dL  GLUCOSE, CAPILLARY     Status: Abnormal   Collection Time    07/22/14  4:03 AM      Result Value Ref Range   Glucose-Capillary 122 (*) 70 - 99 mg/dL  COMPREHENSIVE METABOLIC PANEL     Status: Abnormal   Collection Time    07/22/14  4:55 AM      Result Value Ref Range   Sodium 134 (*) 137 - 147 mEq/L   Potassium 3.8  3.7 - 5.3 mEq/L   Chloride 102  96 - 112 mEq/L   CO2 20  19 - 32 mEq/L   Glucose, Bld 126 (*) 70 - 99 mg/dL   BUN 45 (*) 6 - 23 mg/dL  Creatinine, Ser 1.33  0.50 - 1.35 mg/dL   Calcium 8.4  8.4 - 10.5 mg/dL   Total Protein 5.2 (*) 6.0 - 8.3 g/dL   Albumin 2.0 (*) 3.5 - 5.2 g/dL   AST 50 (*) 0 - 37 U/L   ALT 46  0 - 53 U/L   Alkaline Phosphatase 161 (*) 39 - 117 U/L   Total Bilirubin 6.7 (*) 0.3 - 1.2 mg/dL   GFR calc non Af Amer 60 (*) >90 mL/min   GFR calc Af Amer 70 (*) >90 mL/min   Comment: (NOTE)     The eGFR has been calculated using the CKD EPI equation.     This calculation has not been validated in all clinical situations.     eGFR's persistently <90 mL/min signify possible Chronic Kidney     Disease.   Anion gap 12  5 - 15  CBC     Status: Abnormal   Collection Time    07/22/14  4:55 AM      Result Value Ref Range   WBC 4.8  4.0 - 10.5 K/uL   RBC 3.51 (*) 4.22 - 5.81 MIL/uL   Hemoglobin 9.8 (*) 13.0 - 17.0 g/dL   HCT 29.8 (*) 39.0 - 52.0 %   MCV 84.9  78.0 - 100.0 fL   MCH 27.9  26.0 - 34.0 pg   MCHC 32.9  30.0 - 36.0 g/dL   RDW 14.5  11.5 - 15.5 %   Platelets 38 (*) 150 - 400 K/uL   Comment: SPECIMEN CHECKED FOR CLOTS     REPEATED TO VERIFY     PLATELET COUNT CONFIRMED BY SMEAR  GLUCOSE, CAPILLARY      Status: Abnormal   Collection Time    07/22/14  8:07 AM      Result Value Ref Range   Glucose-Capillary 115 (*) 70 - 99 mg/dL  GLUCOSE, CAPILLARY     Status: Abnormal   Collection Time    07/22/14 12:06 PM      Result Value Ref Range   Glucose-Capillary 210 (*) 70 - 99 mg/dL  GLUCOSE, CAPILLARY     Status: Abnormal   Collection Time    07/22/14  3:49 PM      Result Value Ref Range   Glucose-Capillary 161 (*) 70 - 99 mg/dL  GLUCOSE, CAPILLARY     Status: Abnormal   Collection Time    07/22/14  8:27 PM      Result Value Ref Range   Glucose-Capillary 175 (*) 70 - 99 mg/dL  GLUCOSE, CAPILLARY     Status: Abnormal   Collection Time    07/23/14 12:32 AM      Result Value Ref Range   Glucose-Capillary 163 (*) 70 - 99 mg/dL  COMPREHENSIVE METABOLIC PANEL     Status: Abnormal   Collection Time    07/23/14  3:50 AM      Result Value Ref Range   Sodium 136 (*) 137 - 147 mEq/L   Potassium 4.0  3.7 - 5.3 mEq/L   Chloride 105  96 - 112 mEq/L   CO2 20  19 - 32 mEq/L   Glucose, Bld 127 (*) 70 - 99 mg/dL   BUN 46 (*) 6 - 23 mg/dL   Creatinine, Ser 1.31  0.50 - 1.35 mg/dL   Calcium 8.3 (*) 8.4 - 10.5 mg/dL   Total Protein 4.9 (*) 6.0 - 8.3 g/dL   Albumin 1.9 (*) 3.5 - 5.2 g/dL   AST  45 (*) 0 - 37 U/L   ALT 40  0 - 53 U/L   Alkaline Phosphatase 191 (*) 39 - 117 U/L   Total Bilirubin 5.3 (*) 0.3 - 1.2 mg/dL   GFR calc non Af Amer 62 (*) >90 mL/min   GFR calc Af Amer 71 (*) >90 mL/min   Comment: (NOTE)     The eGFR has been calculated using the CKD EPI equation.     This calculation has not been validated in all clinical situations.     eGFR's persistently <90 mL/min signify possible Chronic Kidney     Disease.   Anion gap 11  5 - 15  CBC     Status: Abnormal   Collection Time    07/23/14  3:50 AM      Result Value Ref Range   WBC 3.6 (*) 4.0 - 10.5 K/uL   RBC 3.32 (*) 4.22 - 5.81 MIL/uL   Hemoglobin 9.5 (*) 13.0 - 17.0 g/dL   HCT 27.6 (*) 39.0 - 52.0 %   MCV 83.1  78.0 - 100.0  fL   MCH 28.6  26.0 - 34.0 pg   MCHC 34.4  30.0 - 36.0 g/dL   RDW 14.4  11.5 - 15.5 %   Platelets 42 (*) 150 - 400 K/uL   Comment: SPECIMEN CHECKED FOR CLOTS     REPEATED TO VERIFY     PLATELET COUNT CONFIRMED BY SMEAR  GLUCOSE, CAPILLARY     Status: Abnormal   Collection Time    07/23/14  4:05 AM      Result Value Ref Range   Glucose-Capillary 113 (*) 70 - 99 mg/dL  GLUCOSE, CAPILLARY     Status: None   Collection Time    07/23/14  7:30 AM      Result Value Ref Range   Glucose-Capillary 92  70 - 99 mg/dL    Imaging / Studies: Mr 3d Recon At Scanner  August 06, 2014   CLINICAL DATA:  Epigastric pain, nausea and vomiting. Elevated lipase. Elevated liver function tests.  EXAM: MRI ABDOMEN WITHOUT  (INCLUDING MRCP)  TECHNIQUE: Multiplanar multisequence MR imaging of the abdomen was performed. Heavily T2-weighted images of the biliary and pancreatic ducts were obtained, and three-dimensional MRCP images were rendered by post processing.  COMPARISON:  CT of the abdomen and pelvis 07/19/2014. Abdominal ultrasound 07/19/2014.  FINDINGS: The liver has a shrunken appearance and markedly nodular contour, compatible with cirrhosis. There is a lace-like pattern of T2 hyperintensity throughout the hepatic parenchyma, most significantly within the right lobe of the liver, compatible with areas of hepatic fibrosis. No discrete hypervascular hepatic lesion is identified on postcontrast images to suggest a hepatocellular carcinoma at this time. MRCP images demonstrate no intra or extrahepatic biliary ductal dilatation. Tiny signal voids are present in the dependent portion of the gallbladder, compatible with tiny gallstones. Gallbladder appears moderately distended, but gallbladder wall thickness is normal and there is no pericholecystic fluid or surrounding inflammatory changes to suggest an acute cholecystitis at this time. The pancreatic duct is also normal in caliber. The pancreas also appears grossly normal in  appearance. Specifically, pancreatic parenchyma enhances uniformly. No pancreatic or peripancreatic fluid collections to suggest the presence of a pseudocyst at this time.  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 The spleen is enlarged measuring 14.3 x 8.2 x 14.9 cm (estimated splenic volume of 874 mL). There are multiple renal lesions bilaterally which demonstrate low T1 signal intensity, high T2 signal intensity,  and do not enhance, compatible with simple cysts, with the largest simple cyst measuring 1.3 cm in the anterior aspect of the interpolar region of the left kidney. There are other small renal lesions that demonstrate high T1 signal intensity, low T2 signal intensity, and do not enhance, compatible with proteinaceous or hemorrhagic cysts, largest of which measures 12 mm in the interpolar region of the right kidney. No other suspicious appearing renal lesions are noted. In addition, in the upper pole of the right kidney there is a 2.8 cm calyceal diverticulum.  IMPRESSION: 1. No intra or extrahepatic biliary ductal dilatation to suggest biliary tract obstruction at this time. 2. Cholelithiasis.  No evidence of acute cholecystitis at this time. 3. Morphologic changes in the liver compatible with cirrhosis with early hepatic fibrosis. There is also dilatation of the portal vein and splenomegaly, indicative of portal hypertension. 4. No acute abnormality of the pancreas. Specifically, no evidence of pancreatic necrosis, and no evidence of pancreatic inflammation or pseudocysts. 5. Additional incidental findings, as above.   Electronically Signed   By: Vinnie Langton M.D.   On: 07/21/2014 15:18   Mr Lambert Mody Cm/mrcp  07/21/2014   CLINICAL DATA:  Epigastric pain, nausea and vomiting. Elevated lipase. Elevated liver function tests.  EXAM: MRI ABDOMEN WITHOUT  (INCLUDING MRCP)  TECHNIQUE: Multiplanar multisequence MR imaging of the abdomen was performed. Heavily T2-weighted images of the biliary  and pancreatic ducts were obtained, and three-dimensional MRCP images were rendered by post processing.  COMPARISON:  CT of the abdomen and pelvis 07/19/2014. Abdominal ultrasound 07/19/2014.  FINDINGS: The liver has a shrunken appearance and markedly nodular contour, compatible with cirrhosis. There is a lace-like pattern of T2 hyperintensity throughout the hepatic parenchyma, most significantly within the right lobe of the liver, compatible with areas of hepatic fibrosis. No discrete hypervascular hepatic lesion is identified on postcontrast images to suggest a hepatocellular carcinoma at this time. MRCP images demonstrate no intra or extrahepatic biliary ductal dilatation. Tiny signal voids are present in the dependent portion of the gallbladder, compatible with tiny gallstones. Gallbladder appears moderately distended, but gallbladder wall thickness is normal and there is no pericholecystic fluid or surrounding inflammatory changes to suggest an acute cholecystitis at this time. The pancreatic duct is also normal in caliber. The pancreas also appears grossly normal in appearance. Specifically, pancreatic parenchyma enhances uniformly. No pancreatic or peripancreatic fluid collections to suggest the presence of a pseudocyst at this time.  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 The spleen is enlarged measuring 14.3 x 8.2 x 14.9 cm (estimated splenic volume of 874 mL). There are multiple renal lesions bilaterally which demonstrate low T1 signal intensity, high T2 signal intensity, and do not enhance, compatible with simple cysts, with the largest simple cyst measuring 1.3 cm in the anterior aspect of the interpolar region of the left kidney. There are other small renal lesions that demonstrate high T1 signal intensity, low T2 signal intensity, and do not enhance, compatible with proteinaceous or hemorrhagic cysts, largest of which measures 12 mm in the interpolar region of the right kidney. No  other suspicious appearing renal lesions are noted. In addition, in the upper pole of the right kidney there is a 2.8 cm calyceal diverticulum.  IMPRESSION: 1. No intra or extrahepatic biliary ductal dilatation to suggest biliary tract obstruction at this time. 2. Cholelithiasis.  No evidence of acute cholecystitis at this time. 3. Morphologic changes in the liver compatible with cirrhosis with early hepatic fibrosis.  There is also dilatation of the portal vein and splenomegaly, indicative of portal hypertension. 4. No acute abnormality of the pancreas. Specifically, no evidence of pancreatic necrosis, and no evidence of pancreatic inflammation or pseudocysts. 5. Additional incidental findings, as above.   Electronically Signed   By: Vinnie Langton M.D.   On: 07/21/2014 15:18    Scheduled Meds: . aspirin  81 mg Oral Daily  . carvedilol  3.125 mg Oral BID WC  . docusate sodium  100 mg Oral BID  . insulin aspart  0-9 Units Subcutaneous 6 times per day  . insulin glargine  15 Units Subcutaneous QHS  . levofloxacin  500 mg Oral Daily  . pantoprazole (PROTONIX) IV  40 mg Intravenous QHS  . sodium chloride  3 mL Intravenous Q12H   Continuous Infusions:  PRN Meds:.dextrose, glucose-Vitamin C, morphine injection, ondansetron (ZOFRAN) IV, ondansetron   Antibiotics: Anti-infectives   Start     Dose/Rate Route Frequency Ordered Stop   07/21/14 1800  levofloxacin (LEVAQUIN) tablet 500 mg     500 mg Oral Daily 07/21/14 1727     07/19/14 0600  piperacillin-tazobactam (ZOSYN) IVPB 3.375 g  Status:  Discontinued     3.375 g 12.5 mL/hr over 240 Minutes Intravenous 3 times per day 07/19/14 0202 07/21/14 1727   07/18/14 2345  piperacillin-tazobactam (ZOSYN) IVPB 3.375 g     3.375 g 100 mL/hr over 30 Minutes Intravenous  Once 07/18/14 2331 07/19/14 0046   07/18/14 2330  piperacillin-tazobactam (ZOSYN) IVPB 3.375 g  Status:  Discontinued     3.375 g 12.5 mL/hr over 240 Minutes Intravenous  Once 07/18/14  2325 07/18/14 2330       Assessment Cirrhosis(childs class C)  EColi bacteremia  Pneumonia  Thrombocytopenia  Diabetes mellitus  Abdominal pain/n/v  transaminitis  Cholelithiasis  Acute pancreatitis  Hx alcohol abuse  Cardiomyopathy   Plan No plans for any surgical intervention per Dr. Hassell Done.  Dr. Hassell Done has discussed this with the patient.  Attending's addendum to follow.    Erby Pian, [facility] Pager [phone] Office [phone]  07/23/2014 7:53 AM

## 2014-07-23 NOTE — Progress Notes (Signed)
Patient Name: [name] Date of Encounter: 07/23/2014     Principal Problem:   Sepsis Active Problems:   HTN (hypertension), benign   Thrombocytopenia   Cirrhosis   Diabetes mellitus type 2, uncontrolled, with complications   Chronic renal insufficiency, stage II (mild)   Gastroenteritis   Alcohol abuse   Aspiration pneumonia   Pancreatitis   Dehydration   Abnormal finding on EKG   Ascites   Cardiomyopathy   Elevated troponin   Bacteremia due to Escherichia coli    SUBJECTIVE  Seen in nuclear med. No CP or SOB. He has had LE swelling since being in the hospital   CURRENT MEDS . aspirin  81 mg Oral Daily  . carvedilol  3.125 mg Oral BID WC  . docusate sodium  100 mg Oral BID  . insulin aspart  0-9 Units Subcutaneous 6 times per day  . insulin glargine  15 Units Subcutaneous QHS  . levofloxacin  500 mg Oral Daily  . pantoprazole (PROTONIX) IV  40 mg Intravenous QHS  . sodium chloride  3 mL Intravenous Q12H    OBJECTIVE  Filed Vitals:   07/22/14 0447 07/22/14 1336 07/22/14 2024 07/23/14 0622  BP: 139/78 138/64 143/71 137/66  Pulse: 64 54 63 63  Temp: 98.1 F (36.7 C) 97.6 F (36.4 C) 97.7 F (36.5 C) 98.2 F (36.8 C)  TempSrc: Oral Oral Oral Oral  Resp: 20 20 19 18   Height:      Weight:      SpO2: 95% 98% 99% 97%    Intake/Output Summary (Last 24 hours) at 07/23/14 1000 Last data filed at 07/22/14 2300  Gross per 24 hour  Intake    360 ml  Output      0 ml  Net    360 ml   Filed Weights   07/19/14 0209  Weight: 233 lb (105.688 kg)    PHYSICAL EXAM  General: Pleasant, NAD. Appears yellow. Neuro: Alert and oriented X 3. Moves all extremities spontaneously. Psych: Normal affect. HEENT:  Normal  Neck: Supple without bruits or JVD. Lungs:  Resp regular and unlabored, CTA. Heart: RRR no s3, s4, +murmurs. Abdomen: Soft, non-tender, non-distended, BS + x 4.  Extremities: 2+ edema with pre-tibal discoloration. left 2 toes  amputated.   Accessory Clinical Findings  CBC  Recent Labs  07/22/14 0455 07/23/14 0350  WBC 4.8 3.6*  HGB 9.8* 9.5*  HCT 29.8* 27.6*  MCV 84.9 83.1  PLT 38* 42*   Basic Metabolic Panel  Recent Labs  07/22/14 0455 07/23/14 0350  NA 134* 136*  K 3.8 4.0  CL 102 105  CO2 20 20  GLUCOSE 126* 127*  BUN 45* 46*  CREATININE 1.33 1.31  CALCIUM 8.4 8.3*   Liver Function Tests  Recent Labs  07/22/14 0455 07/23/14 0350  AST 50* 45*  ALT 46 40  ALKPHOS 161* 191*  BILITOT 6.7* 5.3*  PROT 5.2* 4.9*  ALBUMIN 2.0* 1.9*    Recent Labs  07/21/14 0427  LIPASE 136*     TELE  NSR   Radiology/Studies  Ct Abdomen Pelvis Wo Contrast  07/19/2014   CLINICAL DATA:  Fever, epigastric pain, and vomiting.  EXAM: CT ABDOMEN AND PELVIS WITHOUT CONTRAST  TECHNIQUE: Multidetector CT imaging of the abdomen and pelvis was performed following the standard protocol without IV contrast.  COMPARISON:  10/02/2011  FINDINGS: Small left pleural effusion with basilar atelectasis or consolidation. Mild cardiac enlargement and small pericardial effusion. Coronary artery calcifications.  Enlarged lateral segment left lobe of liver and nodular liver contour compatible with cirrhosis. Spleen is enlarged. Epigastric and splenic vein varices. Prominent celiac axis and porta hepatis lymph nodes are probably reactive and associated with cirrhosis. Small amount of free fluid in the upper abdomen around the liver likely due to ascites. Gallbladder is mildly distended with small stones present. Pancreas is atrophic. Small duodenum diverticulum containing contrast material. No bile duct dilatation. Adrenal glands, abdominal aorta, inferior vena cava, and retroperitoneal lymph nodes are unremarkable. Sub cm hyper dense focus in the mid pole of the right kidney probably represents small hemorrhagic cyst. No hydronephrosis in the kidneys. Stomach and small bowel are decompressed. Contrast material flows through to  the colon without evidence of bowel obstruction. Colon is decompressed with scattered stool. No free air in the abdomen.  Pelvis: Mild thickening of the bladder wall. No free or loculated pelvic fluid collections. Appendix is not identified. No evidence of diverticulitis. No pelvic mass or lymphadenopathy. Degenerative changes in the spine and hips. No evidence of destructive bone lesion.  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 Cholelithiasis. Small amount of ascites. Fatty atrophy of the pancreas. Bladder wall thickening may indicate cystitis or bladder hypertrophy. Left pleural effusion with consolidation or atelectasis in the left lung base, possibly pneumonia.   Electronically Signed   By: Burman Nieves M.D.   On: 07/19/2014 01:24   Dg Chest 2 View  07/18/2014   CLINICAL DATA:  Vomiting, short of breath and chest pain  EXAM: CHEST  2 VIEW  COMPARISON:  Prior chest x-ray 02/19/2013  FINDINGS: Interval development of lingular opacity obscuring into the cardiac apex. No definite pleural effusion visible on the lateral view. Cardiac and mediastinal contours are unchanged. The right lung remains well aerated. No acute osseous abnormality. Degenerative change in the bilateral acromioclavicular joints.  IMPRESSION: New opacity in the lingula obscuring the left cardiac apex may reflect atelectasis or infiltrate.   Electronically Signed   By: Malachy Moan M.D.   On: 07/18/2014 17:50   Nm Hepatobiliary  07/04/2014   CLINICAL DATA:  Nausea vomiting, abdominal pain, bloating  EXAM: NUCLEAR MEDICINE HEPATOBILIARY IMAGING  TECHNIQUE: Sequential images of the abdomen were obtained out to 60 minutes following intravenous administration of radiopharmaceutical.  RADIOPHARMACEUTICALS:  5.5 Millicurie Tc-81m Choletec  COMPARISON:  Abdominal ultrasound 06/27/2014  FINDINGS: There is normal uptake of radiotracer in the  liver and gallbladder, with prompt excretion at 10 min of radiotracer into the biliary system and duodenum.  IMPRESSION: No evidence of cystic duct obstruction.   Electronically Signed   By: Christiana Pellant M.D.   On: 07/04/2014 09:14   US Abdomen Complete  07/19/2014   CLINICAL DATA:  Cirrhosis and fever.  EXAM: ULTRASOUND ABDOMEN COMPLETE  COMPARISON:  06/27/2014  FINDINGS: Gallbladder:  Gallbladder is moderately distended with sludge and focal tumefactive sludge demonstrated. No discrete stones. Appearance is similar prior study. Borderline wall thickening. Murphy's sign is negative.  Common bile duct:  Diameter: 7 mm, normal  Liver:  No focal lesion identified. Within normal limits in parenchymal echogenicity.  IVC:  No abnormality visualized.  Pancreas:  Visualized portion unremarkable.  Spleen:  Size and appearance within normal limits.  Right Kidney:  Length: 13.2 cm. Echogenicity within normal limits. No mass or hydronephrosis visualized.  Left Kidney:  Length: 13.8 cm. Echogenicity within normal limits. No mass or hydronephrosis visualized.  Abdominal aorta:  No aneurysm  visualized.  Other findings:  None.  IMPRESSION: Gallbladder is moderately distended with sludge and borderline wall thickening. Murphy's sign is negative. Appearance is similar to prior study.   Electronically Signed   By: Burman NievesWilliam  Stevens M.D.   On: 07/19/2014 00:57   [place] Abdomen Complete  06/27/2014   CLINICAL DATA:  Postprandial epigastric pain. Nausea, vomiting, diarrhea.  EXAM: ULTRASOUND ABDOMEN COMPLETE  COMPARISON:  04/04/2014.  FINDINGS: Gallbladder:  There is no wall thickening or pericholecystic fluid. No sonographic Murphy sign. Biliary sludge is present.  Common bile duct:  Diameter: Visualize portion measures 3 mm  Liver:  Echogenic with overall coarse echotexture, compatible with hepatic cirrhosis. Micronodular contour the surface is suboptimally visualized. No mass lesion.  IVC:  Suboptimally visualized due to bowel  gas.  Pancreas:  Suboptimally visualized due to bowel gas.  Spleen:  10.6 cm.  Right Kidney:  Length: 12.8 cm. Unchanged mild pelviectasis. Echogenicity within normal limits. No mass or hydronephrosis visualized.  Left Kidney:  Length: 13.4 cm. Echogenicity within normal limits. No mass or hydronephrosis visualized.  Abdominal aorta:  No aneurysm visualized.  Other findings:  LEFT pleural effusion noted.  IMPRESSION: 1. Biliary sludge. No stones or biliary ductal dilation. No findings of cholecystitis. 2. Hepatic cirrhosis. 3. LEFT pleural effusion.   Electronically Signed   By: Andreas NewportGeoffrey  Lamke M.D.   On: 06/27/2014 10:27   Mr 3d Recon At Scanner  07/21/2014   CLINICAL DATA:  Epigastric pain, nausea and vomiting. Elevated lipase. Elevated liver function tests.  EXAM: MRI ABDOMEN WITHOUT  (INCLUDING MRCP)  TECHNIQUE: Multiplanar multisequence MR imaging of the abdomen was performed. Heavily T2-weighted images of the biliary and pancreatic ducts were obtained, and three-dimensional MRCP images were rendered by post processing.  COMPARISON:  CT of the abdomen and pelvis 07/19/2014. Abdominal ultrasound 07/19/2014.  FINDINGS: The liver has a shrunken appearance and markedly nodular contour, compatible with cirrhosis. There is a lace-like pattern of T2 hyperintensity throughout the hepatic parenchyma, most significantly within the right lobe of the liver, compatible with areas of hepatic fibrosis. No discrete hypervascular hepatic lesion is identified on postcontrast images to suggest a hepatocellular carcinoma at this time. MRCP images demonstrate no intra or extrahepatic biliary ductal dilatation. Tiny signal voids are present in the dependent portion of the gallbladder, compatible with tiny gallstones. Gallbladder appears moderately distended, but gallbladder wall thickness is normal and there is no pericholecystic fluid or surrounding inflammatory changes to suggest an acute cholecystitis at this time. The  pancreatic duct is also normal in caliber. The pancreas also appears grossly normal in appearance. Specifically, pancreatic parenchyma enhances uniformly. No pancreatic or peripancreatic fluid collections to suggest the presence of a pseudocyst at this time.  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 The spleen is enlarged measuring 14.3 x 8.2 x 14.9 cm (estimated splenic volume of 874 mL). There are multiple renal lesions bilaterally which demonstrate low T1 signal intensity, high T2 signal intensity, and do not enhance, compatible with simple cysts, with the largest simple cyst measuring 1.3 cm in the anterior aspect of the interpolar region of the left kidney. There are other small renal lesions that demonstrate high T1 signal intensity, low T2 signal intensity, and do not enhance, compatible with proteinaceous or hemorrhagic cysts, largest of which measures 12 mm in the interpolar region of the right kidney. No other suspicious appearing renal lesions are noted. In addition, in the upper pole of the right kidney there is a 2.8 cm  calyceal diverticulum.  IMPRESSION: 1. No intra or extrahepatic biliary ductal dilatation to suggest biliary tract obstruction at this time. 2. Cholelithiasis.  No evidence of acute cholecystitis at this time. 3. Morphologic changes in the liver compatible with cirrhosis with early hepatic fibrosis. There is also dilatation of the portal vein and splenomegaly, indicative of portal hypertension. 4. No acute abnormality of the pancreas. Specifically, no evidence of pancreatic necrosis, and no evidence of pancreatic inflammation or pseudocysts. 5. Additional incidental findings, as above.   Electronically Signed   By: Trudie Reed M.D.   On: 07/21/2014 15:18   Mr Jorja Loa Cm/mrcp  07/21/2014   CLINICAL DATA:  Epigastric pain, nausea and vomiting. Elevated lipase. Elevated liver function tests.  EXAM: MRI ABDOMEN WITHOUT  (INCLUDING MRCP)  TECHNIQUE: Multiplanar  multisequence MR imaging of the abdomen was performed. Heavily T2-weighted images of the biliary and pancreatic ducts were obtained, and three-dimensional MRCP images were rendered by post processing.  COMPARISON:  CT of the abdomen and pelvis 07/19/2014. Abdominal ultrasound 07/19/2014.  FINDINGS: The liver has a shrunken appearance and markedly nodular contour, compatible with cirrhosis. There is a lace-like pattern of T2 hyperintensity throughout the hepatic parenchyma, most significantly within the right lobe of the liver, compatible with areas of hepatic fibrosis. No discrete hypervascular hepatic lesion is identified on postcontrast images to suggest a hepatocellular carcinoma at this time. MRCP images demonstrate no intra or extrahepatic biliary ductal dilatation. Tiny signal voids are present in the dependent portion of the gallbladder, compatible with tiny gallstones. Gallbladder appears moderately distended, but gallbladder wall thickness is normal and there is no pericholecystic fluid or surrounding inflammatory changes to suggest an acute cholecystitis at this time. The pancreatic duct is also normal in caliber. The pancreas also appears grossly normal in appearance. Specifically, pancreatic parenchyma enhances uniformly. No pancreatic or peripancreatic fluid collections to suggest the presence of a pseudocyst at this time.  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 The spleen is enlarged measuring 14.3 x 8.2 x 14.9 cm (estimated splenic volume of 874 mL). There are multiple renal lesions bilaterally which demonstrate low T1 signal intensity, high T2 signal intensity, and do not enhance, compatible with simple cysts, with the largest simple cyst measuring 1.3 cm in the anterior aspect of the interpolar region of the left kidney. There are other small renal lesions that demonstrate high T1 signal intensity, low T2 signal intensity, and do not enhance, compatible with proteinaceous or  hemorrhagic cysts, largest of which measures 12 mm in the interpolar region of the right kidney. No other suspicious appearing renal lesions are noted. In addition, in the upper pole of the right kidney there is a 2.8 cm calyceal diverticulum.  IMPRESSION: 1. No intra or extrahepatic biliary ductal dilatation to suggest biliary tract obstruction at this time. 2. Cholelithiasis.  No evidence of acute cholecystitis at this time. 3. Morphologic changes in the liver compatible with cirrhosis with early hepatic fibrosis. There is also dilatation of the portal vein and splenomegaly, indicative of portal hypertension. 4. No acute abnormality of the pancreas. Specifically, no evidence of pancreatic necrosis, and no evidence of pancreatic inflammation or pseudocysts. 5. Additional incidental findings, as above.   Electronically Signed   By: Trudie Reed M.D.   On: 07/21/2014 15:18   Study Date: 07/19/2014 LV EF: 35% - 40% Study Conclusions - Left ventricle: The cavity size was moderately dilated. There was mild concentric hypertrophy. Systolic function was moderately reduced. The estimated ejection  fraction was in the range of 35% to 40%. Wall motion was normal; there were no regional wall motion abnormalities. Features are consistent with a pseudonormal left ventricular filling pattern, with concomitant abnormal relaxation and increased filling pressure (grade 2 diastolic dysfunction). Doppler parameters are consistent with elevated ventricular end-diastolic filling pressure. - Aortic valve: Bicuspid; mildly thickened, mildly calcified leaflets. There was mild stenosis. There was mild regurgitation. Valve area (VTI): 2.18 cm^2. Valve area (Vmax): 1.8 cm^2. - Aortic root: The aortic root was normal in size. - Mitral valve: There was trivial regurgitation. - Left atrium: The atrium was moderately dilated. - Pericardium, extracardiac: There was mild pericardial effusion located around inferior and  inferolateral walls. No signs of hemodynamic compromise. Impressions - Bicuspid aortic valve with mild stenosis and mild regurgitation. No obvious vegetation on any of the valves, however the study is of suboptimal quality. If clinically indicated a TEE should be considered. Moderately dilated left ventricle with LVEF 35-40% with diffuse hypokinesis. Grade 2 diastolic dysfunction with elevated filling pressures. Mild pericardial effusion, no signs of tamponade.    ASSESSMENT AND PLAN  Silus Lanzo is a 53 year old male with a history of diabetes mellitus, cirrhosis, alcohol abuse(stopped 2012), peripheral neuropathy and cholelithiasis who was admitted on 07/18/14 with epigastric abdominal pain, nausea, vomiting and fevers. He was found to have E coli bacteremia, pneumonia, dehydration and thrombocytopenia. He also had a positive troponin and therefore cardiology was consulted.   ELEVATED TROPONIN: peak 2.26. Felt to be demand ischemia. Work up as below.   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   CARDIOMYOPATHY: EF 35-40%. Undergoing ischemic work up with  [facility] today prior to possible elective cholecystectomy. However, this has been deferred due to thrombocytopenia.  Will await images.    Urban Gibson PA-C  Pager [phone]

## 2014-07-24 ENCOUNTER — Other Ambulatory Visit: Payer: Self-pay | Admitting: Physician Assistant

## 2014-07-24 ENCOUNTER — Telehealth: Payer: Self-pay | Admitting: *Deleted

## 2014-07-24 DIAGNOSIS — E876 Hypokalemia: Secondary | ICD-10-CM

## 2014-07-24 LAB — GLUCOSE, CAPILLARY
GLUCOSE-CAPILLARY: 106 mg/dL — AB (ref 70–99)
Glucose-Capillary: 91 mg/dL (ref 70–99)

## 2014-07-24 LAB — COMPREHENSIVE METABOLIC PANEL
ALT: 37 U/L (ref 0–53)
AST: 44 U/L — AB (ref 0–37)
Albumin: 2 g/dL — ABNORMAL LOW (ref 3.5–5.2)
Alkaline Phosphatase: 211 U/L — ABNORMAL HIGH (ref 39–117)
Anion gap: 10 (ref 5–15)
BUN: 41 mg/dL — ABNORMAL HIGH (ref 6–23)
CO2: 24 meq/L (ref 19–32)
Calcium: 8.5 mg/dL (ref 8.4–10.5)
Chloride: 105 mEq/L (ref 96–112)
Creatinine, Ser: 1.35 mg/dL (ref 0.50–1.35)
GFR calc Af Amer: 69 mL/min — ABNORMAL LOW (ref >90)
GFR, EST NON AFRICAN AMERICAN: 59 mL/min — AB (ref >90)
Glucose, Bld: 102 mg/dL — ABNORMAL HIGH (ref 70–99)
Potassium: 4.1 mEq/L (ref 3.7–5.3)
SODIUM: 139 meq/L (ref 137–147)
Total Bilirubin: 4.3 mg/dL — ABNORMAL HIGH (ref 0.3–1.2)
Total Protein: 5.1 g/dL — ABNORMAL LOW (ref 6.0–8.3)

## 2014-07-24 MED ORDER — LEVOFLOXACIN 500 MG PO TABS: 500.0000 mg | ORAL_TABLET | Freq: Every day | ORAL | Status: DC

## 2014-07-24 MED ORDER — LISINOPRIL 10 MG PO TABS: 10.0000 mg | ORAL_TABLET | Freq: Every day | ORAL | Status: DC

## 2014-07-24 MED ORDER — SPIRONOLACTONE 100 MG PO TABS: 100.0000 mg | ORAL_TABLET | Freq: Every day | ORAL | Status: DC

## 2014-07-24 MED ORDER — CARVEDILOL 3.125 MG PO TABS: 3.1250 mg | ORAL_TABLET | Freq: Two times a day (BID) | ORAL | Status: DC

## 2014-07-24 MED ORDER — LISINOPRIL 10 MG PO TABS
10.0000 mg | ORAL_TABLET | Freq: Every day | ORAL | Status: DC
  Administered 2014-07-24: 10 mg via ORAL
  Filled 2014-07-24: qty 1

## 2014-07-24 NOTE — Telephone Encounter (Signed)
Left a message for patient to call back. 

## 2014-07-24 NOTE — Progress Notes (Signed)
SUBJECTIVE:  No chest pain.  No SOB.   PHYSICAL EXAM Filed Vitals:   07/23/14 1410 07/23/14 1632 07/23/14 2141 07/24/14 0434  BP: 153/67 168/82 143/66 148/68  Pulse: 61 60 64 70  Temp: 97.5 F (36.4 C)  98.2 F (36.8 C) 98.3 F (36.8 C)  TempSrc: Oral  Oral Oral  Resp: 20  20 18   Height:      Weight:      SpO2: 98%  98% 98%   General:  No distress Lungs:  Clear Heart:  RRR Abdomen:  Positive bowel sounds, no rebound no guarding Extremities:  Mild edema   LABS:  Results for orders placed during the hospital encounter of 07/18/14 (from the past 24 hour(s))  GLUCOSE, CAPILLARY     Status: Abnormal   Collection Time    07/23/14 12:02 PM      Result Value Ref Range   Glucose-Capillary 177 (*) 70 - 99 mg/dL  GLUCOSE, CAPILLARY     Status: Abnormal   Collection Time    07/23/14  4:46 PM      Result Value Ref Range   Glucose-Capillary 170 (*) 70 - 99 mg/dL  GLUCOSE, CAPILLARY     Status: Abnormal   Collection Time    07/23/14  8:01 PM      Result Value Ref Range   Glucose-Capillary 168 (*) 70 - 99 mg/dL   Comment 1 Notify RN    GLUCOSE, CAPILLARY     Status: Abnormal   Collection Time    07/23/14 11:48 PM      Result Value Ref Range   Glucose-Capillary 112 (*) 70 - 99 mg/dL  COMPREHENSIVE METABOLIC PANEL     Status: Abnormal   Collection Time    07/24/14  3:42 AM      Result Value Ref Range   Sodium 139  137 - 147 mEq/L   Potassium 4.1  3.7 - 5.3 mEq/L   Chloride 105  96 - 112 mEq/L   CO2 24  19 - 32 mEq/L   Glucose, Bld 102 (*) 70 - 99 mg/dL   BUN 41 (*) 6 - 23 mg/dL   Creatinine, Ser 4.091.35  0.50 - 1.35 mg/dL   Calcium 8.5  8.4 - 81.110.5 mg/dL   Total Protein 5.1 (*) 6.0 - 8.3 g/dL   Albumin 2.0 (*) 3.5 - 5.2 g/dL   AST 44 (*) 0 - 37 U/L   ALT 37  0 - 53 U/L   Alkaline Phosphatase 211 (*) 39 - 117 U/L   Total Bilirubin 4.3 (*) 0.3 - 1.2 mg/dL   GFR calc non Af Amer 59 (*) >90 mL/min   GFR calc Af Amer 69 (*) >90 mL/min   Anion gap 10  5 - 15  GLUCOSE,  CAPILLARY     Status: Abnormal   Collection Time    07/24/14  4:00 AM      Result Value Ref Range   Glucose-Capillary 106 (*) 70 - 99 mg/dL  GLUCOSE, CAPILLARY     Status: None   Collection Time    07/24/14  7:46 AM      Result Value Ref Range   Glucose-Capillary 91  70 - 99 mg/dL    Intake/Output Summary (Last 24 hours) at 07/24/14 0844 Last data filed at 07/23/14 1831  Gross per 24 hour  Intake    360 ml  Output      0 ml  Net    360 ml  ASSESSMENT AND PLAN:  ELEVATED TROPONIN:  Felt to be demand ischemia.  No ischemia on perfusion imaging.    BICUSPID AORTIC VALVE:  We will follow this clinically.    CARDIOMYOPATHY:  Possible scar on the Lexiscan Myoview.  However, no ischemia.  He will be managed medically for probable ischemic cardiomyopathy.  We will have him follow with Dr. Shirlee Latch.   I am going to restart lisinopril but at a lower dose than previous.    Fayrene Fearing [facility] 07/24/2014 8:44 AM

## 2014-07-24 NOTE — Telephone Encounter (Signed)
Message copied by Daphine Deutscher on Thu Jul 24, 2014  1:20 PM ------      Message from: Mike Gip S      Created: Thu Jul 24, 2014 11:56 AM       Merisa Julio-pt was discharged this am- please call him and let him know we made him a follow up appt with dr. Juanda Chance on 9/15 at 11 am -cirrhosis, ecoli bacteremic- please be sure he know to return to hospital for fever, recurrent  epigastric pain or vomiitng. ------

## 2014-07-24 NOTE — Discharge Summary (Signed)
Physician Discharge Summary  [name]:[id number] DOB: 05-02-1961 DOA: 07/18/2014  PCP: Ralph Massed, MD  Admit date: 07/18/2014 Discharge date: 07/24/2014  Time spent: 40 minutes  Recommendations for Outpatient Follow-up:  1. He is scheduled to see Dr. [name] tomorrow, on 8/7. 2. Follow renal function closely, patient is on lisinopril, Lasix and Aldactone.  Discharge Diagnoses:  Principal Problem:   Sepsis Active Problems:   HTN (hypertension), benign   Thrombocytopenia   Cirrhosis   Diabetes mellitus type 2, uncontrolled, with complications   Chronic renal insufficiency, stage II (mild)   Gastroenteritis   Alcohol abuse   Aspiration pneumonia   Pancreatitis   Dehydration   Abnormal finding on EKG   Ascites   Cardiomyopathy   Elevated troponin   Bacteremia due to Escherichia coli   Discharge Condition: Stable  Diet recommendation: Heart healthy diet  Filed Weights   07/19/14 0209  Weight: 105.688 kg (233 lb)    History of present illness:  On 07/17/14 patient started to have nausea and vomiting and severe epigastric pain. Patient is alcoholic in remission. Last EtOH intake 2012. He has been diagnosed with cirrhosis since 2012 and have been followed for this by his PCP.  Patient denies any cough. He had fever for the past 24 hours. He went to his PCP and was diagnosed with PNA and pancreatitis based blood work and CXR and was sent to ER. IN ER he was found to have elevated Cr. 1.7, lipase 500's and elevated bilirubin up to 7.4. From normal 2 weeks ago. Family states his sugars have been dropping into 50' s lately. He has lost some weight.  Of note patient has hx of thrombocytopenia that was thought to be possible due to cirrhosis.  Patient have had a case of pancreatitis in the past. 2 weeks ago he had some abdominal pain and had a HIDA scan ordered showing no evidence of cystic duct obstruction. [place] from 06/26/14 showed biliary sludge but no stones no evidence of  cholecystitis at that time.   Hospital Course:   Sepsis likely secondary to pneumonia and EColi bacteremia  -Patient currently afebrile with clinical improvement.  -Blood cultures 1/2 with EColi. Urine cultures negative.  -WBC is normalized. Continue oral levaquin and treat for total 2 weeks.   EColi Bacteremia  -1/2 blood cx positive for EColi, pansensitive. Continue oral levaquin and treat for total of 2 weeks.  -Unidentified source of the bacteremia, urine is clear, this is likely from biliary tree.  -Cannot rule out acute cholangitis, as patient probably had a impacted CBD stone and he passed it. -CT scans/MRCP showed only cirrhotic liver but no evidence of bile duct stones.  -Patient did very well on oral levofloxacin (adjusted for renal function), discharge on Levaquin, finish total of 2 weeks.  Probable gallstone pancreatitis  -Lipase levels on admission was 679. Lipase now at 114. Clinical improvement and tolerated liquids yesterday.  -LFTs trending down. Total bilirubin however is elevated at 7.2. Abdominal ultrasound with a moderately distended gallbladder with sludge and borderline wall thickening similar to prior study. Murphy sign is negative.  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 Cholelithiasis. Fatty atrophy of the pancreas.  -Patient had a HIDA scan done on 07/04/2014 with no evidence of cystic duct obstruction. NSL IV fluids. Patient tolerating solid diet. MRCP with cirrhotic liver, no ductal dilatation, no pancreatitis, gallbladder with tiny stones with  no wall thickening.  -Patient tolerating regular food very well.   Probable aspiration pneumonia versus community-acquired pneumonia  Patient had an episode of nausea and vomiting concerning for aspiration pneumonia. Chest x-ray and CT scan consistent with probable pneumonia. Patient is currently  afebrile. Blood cultures 1/2 + ECOLI pansensitive. Sputum Gram stain and cultures pending. Continue oral levaquin and treat for total of 2 weeks. Follow.   Abnormal EKG/elevated troponin/Cardiomyopathy  EKG with T wave inversion in leads 1 and aVL. Patient did present with epigastric pain however denies any chest pain. Elevated cardiac enzymes. 2 d echo with cardiomyopathy and EF= 35-40%, with bicuspid AV with mild stenosis and mild regurgitation.  Continue ASA. Continue coreg per cardiology rxcs. Patient undergone LexiScan Myoview test and showed no evidence of ischemia, LVEF of 19%. Patient LVEF in 2D echo with 35-40%, LexiScan Myoview showed ejection fraction of only 19%. Patient is on Coreg and lisinopril these were restarted the time of discharge. Patient on Lasix 40 mg, Aldactone 100 mg added because of cirrhosis and low LVEF.  Type 2 diabetes  Hemoglobin A1c 7.4 06/26/2014. CBGs have ranged from 115-210. Continue current dose of Lantus. Sliding scale insulin.   Acute on chronic kidney disease stage II  Renal function trending down. Urine sodium=16, and urine creatinine = 168.2. FeNa= 0.14%. NSL IVF. Follow urine output. Follow.   Chronic thrombocytopenia  Likely secondary to alcohol abuse/cirrhosis. No evidence of bleeding. Follow.   Dehydration  Improved. NSL IVF.   Hypertension  Stable, lisinopril was on hold during this hospital stay restarted at 10 mg at the time of discharge.  Probable liver cirrhosis Has chronic transaminitis, imaging including MRI/ultrasound consistent with liver cirrhosis. Patient also has hypoalbuminemia, hyperbilirubinemia, thrombocytopenia. Recent hepatitis panel is negative, probably need referral to a hepatologist as outpatient. ?NAFLD.   Procedures:  LexiScan Myoview no showed no evidence of ischemia  Consultations:  General surgery  Gastroenterology.  Cardiology  Discharge Exam: Filed Vitals:   07/24/14 0434  BP: 148/68  Pulse: 70   Temp: 98.3 F (36.8 C)  Resp: 18   General: Alert and awake, oriented x3, not in any acute distress. HEENT: anicteric sclera, pupils reactive to light and accommodation, EOMI CVS: S1-S2 clear, no murmur rubs or gallops Chest: clear to auscultation bilaterally, no wheezing, rales or rhonchi Abdomen: soft nontender, nondistended, normal bowel sounds, no organomegaly Extremities: no cyanosis, clubbing or edema noted bilaterally Neuro: Cranial nerves II-XII intact, no focal neurological deficits  Discharge Instructions You were cared for by a hospitalist during your hospital stay. If you have any questions about your discharge medications or the care you received while you were in the hospital after you are discharged, you can call the unit and asked to speak with the hospitalist on call if the hospitalist that took care of you is not available. Once you are discharged, your primary care physician will handle any further medical issues. Please note that NO REFILLS for any discharge medications will be authorized once you are discharged, as it is imperative that you return to your primary care physician (or establish a relationship with a primary care physician if you do not have one) for your aftercare needs so that they can reassess your need for medications and monitor your lab values.  Discharge Instructions   Diet - low sodium heart healthy    Complete by:  As directed      Diet Carb Modified    Complete by:  As directed  Increase activity slowly    Complete by:  As directed             Medication List         carvedilol 3.125 MG tablet  Commonly known as:  COREG  Take 1 tablet (3.125 mg total) by mouth 2 (two) times daily with a meal.     furosemide 40 MG tablet  Commonly known as:  LASIX  Take 40 mg by mouth 2 (two) times daily.     glucagon 1 MG injection  Inject 1 mg into the vein once as needed. For hypoglycemia     insulin aspart 100 UNIT/ML injection  Commonly  known as:  novoLOG  Inject 10 Units into the skin 3 (three) times daily before meals.     insulin glargine 100 UNIT/ML injection  Commonly known as:  LANTUS  Inject 38 Units into the skin at bedtime.     levofloxacin 500 MG tablet  Commonly known as:  LEVAQUIN  Take 1 tablet (500 mg total) by mouth daily.     lisinopril 10 MG tablet  Commonly known as:  PRINIVIL,ZESTRIL  Take 1 tablet (10 mg total) by mouth daily.     pantoprazole 40 MG tablet  Commonly known as:  PROTONIX  Take 1 tablet (40 mg total) by mouth daily.     promethazine 25 MG tablet  Commonly known as:  PHENERGAN  Take 1 tablet (25 mg total) by mouth every 6 (six) hours as needed for nausea or vomiting.     promethazine 25 MG suppository  Commonly known as:  PHENERGAN  Place 1 suppository (25 mg total) rectally every 6 (six) hours as needed for nausea or vomiting.     ranitidine 150 MG tablet  Commonly known as:  ZANTAC  Take 1 tablet (150 mg total) by mouth at bedtime.     spironolactone 100 MG tablet  Commonly known as:  ALDACTONE  Take 1 tablet (100 mg total) by mouth daily.       Allergies  Allergen Reactions  . Hydralazine Hcl Hives  . Naproxen Nausea And Vomiting  . Other Itching and Other (See Comments)    PERFUMES - SNEEZING       Follow-up Information   Follow up with MCGOWEN,PHILIP H, MD In 1 week.   Specialty:  Family Medicine   Contact information:   [address] [place] [address] [phone]        The results of significant diagnostics from this hospitalization (including imaging, microbiology, ancillary and laboratory) are listed below for reference.    Significant Diagnostic Studies: Ct Abdomen Pelvis Wo Contrast  07/19/2014   CLINICAL DATA:  Fever, epigastric pain, and vomiting.  EXAM: CT ABDOMEN AND PELVIS WITHOUT CONTRAST  TECHNIQUE: Multidetector CT imaging of the abdomen and pelvis was performed following the standard protocol without IV contrast.  COMPARISON:   10/02/2011  FINDINGS: Small left pleural effusion with basilar atelectasis or consolidation. Mild cardiac enlargement and small pericardial effusion. Coronary artery calcifications.  Enlarged lateral segment left lobe of liver and nodular liver contour compatible with cirrhosis. Spleen is enlarged. Epigastric and splenic vein varices. Prominent celiac axis and porta hepatis lymph nodes are probably reactive and associated with cirrhosis. Small amount of free fluid in the upper abdomen around the liver likely due to ascites. Gallbladder is mildly distended with small stones present. Pancreas is atrophic. Small duodenum diverticulum containing contrast material. No bile duct dilatation. Adrenal glands, abdominal aorta, inferior  vena cava, and retroperitoneal lymph nodes are unremarkable. Sub cm hyper dense focus in the mid pole of the right kidney probably represents small hemorrhagic cyst. No hydronephrosis in the kidneys. Stomach and small bowel are decompressed. Contrast material flows through to the colon without evidence of bowel obstruction. Colon is decompressed with scattered stool. No free air in the abdomen.  Pelvis: Mild thickening of the bladder wall. No free or loculated pelvic fluid collections. Appendix is not identified. No evidence of diverticulitis. No pelvic mass or lymphadenopathy. Degenerative changes in the spine and hips. No evidence of destructive bone lesion.  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 Cholelithiasis. Small amount of ascites. Fatty atrophy of the pancreas. Bladder wall thickening may indicate cystitis or bladder hypertrophy. Left pleural effusion with consolidation or atelectasis in the left lung base, possibly pneumonia.   Electronically Signed   By: Burman Nieves M.D.   On: 07/19/2014 01:24   Dg Chest 2 View  07/18/2014   CLINICAL DATA:  Vomiting, short of breath and  chest pain  EXAM: CHEST  2 VIEW  COMPARISON:  Prior chest x-ray 02/19/2013  FINDINGS: Interval development of lingular opacity obscuring into the cardiac apex. No definite pleural effusion visible on the lateral view. Cardiac and mediastinal contours are unchanged. The right lung remains well aerated. No acute osseous abnormality. Degenerative change in the bilateral acromioclavicular joints.  IMPRESSION: New opacity in the lingula obscuring the left cardiac apex may reflect atelectasis or infiltrate.   Electronically Signed   By: Malachy Moan M.D.   On: 07/18/2014 17:50   Nm Hepatobiliary  07/04/2014   CLINICAL DATA:  Nausea vomiting, abdominal pain, bloating  EXAM: NUCLEAR MEDICINE HEPATOBILIARY IMAGING  TECHNIQUE: Sequential images of the abdomen were obtained out to 60 minutes following intravenous administration of radiopharmaceutical.  RADIOPHARMACEUTICALS:  5.5 Millicurie Tc-60m Choletec  COMPARISON:  Abdominal ultrasound 06/27/2014  FINDINGS: There is normal uptake of radiotracer in the liver and gallbladder, with prompt excretion at 10 min of radiotracer into the biliary system and duodenum.  IMPRESSION: No evidence of cystic duct obstruction.   Electronically Signed   By: Christiana Pellant M.D.   On: 07/04/2014 09:14   US Abdomen Complete  07/19/2014   CLINICAL DATA:  Cirrhosis and fever.  EXAM: ULTRASOUND ABDOMEN COMPLETE  COMPARISON:  06/27/2014  FINDINGS: Gallbladder:  Gallbladder is moderately distended with sludge and focal tumefactive sludge demonstrated. No discrete stones. Appearance is similar prior study. Borderline wall thickening. Murphy's sign is negative.  Common bile duct:  Diameter: 7 mm, normal  Liver:  No focal lesion identified. Within normal limits in parenchymal echogenicity.  IVC:  No abnormality visualized.  Pancreas:  Visualized portion unremarkable.  Spleen:  Size and appearance within normal limits.  Right Kidney:  Length: 13.2 cm. Echogenicity within normal limits. No  mass or hydronephrosis visualized.  Left Kidney:  Length: 13.8 cm. Echogenicity within normal limits. No mass or hydronephrosis visualized.  Abdominal aorta:  No aneurysm visualized.  Other findings:  None.  IMPRESSION: Gallbladder is moderately distended with sludge and borderline wall thickening. Murphy's sign is negative. Appearance is similar to prior study.   Electronically Signed   By: Burman Nieves M.D.   On: 07/19/2014 00:57   US Abdomen Complete  06/27/2014   CLINICAL DATA:  Postprandial epigastric pain. Nausea, vomiting, diarrhea.  EXAM: ULTRASOUND ABDOMEN COMPLETE  COMPARISON:  04/04/2014.  FINDINGS: Gallbladder:  There is no wall  thickening or pericholecystic fluid. No sonographic Murphy sign. Biliary sludge is present.  Common bile duct:  Diameter: Visualize portion measures 3 mm  Liver:  Echogenic with overall coarse echotexture, compatible with hepatic cirrhosis. Micronodular contour the surface is suboptimally visualized. No mass lesion.  IVC:  Suboptimally visualized due to bowel gas.  Pancreas:  Suboptimally visualized due to bowel gas.  Spleen:  10.6 cm.  Right Kidney:  Length: 12.8 cm. Unchanged mild pelviectasis. Echogenicity within normal limits. No mass or hydronephrosis visualized.  Left Kidney:  Length: 13.4 cm. Echogenicity within normal limits. No mass or hydronephrosis visualized.  Abdominal aorta:  No aneurysm visualized.  Other findings:  LEFT pleural effusion noted.  IMPRESSION: 1. Biliary sludge. No stones or biliary ductal dilation. No findings of cholecystitis. 2. Hepatic cirrhosis. 3. LEFT pleural effusion.   Electronically Signed   By: Andreas Newport M.D.   On: 06/27/2014 10:27   Mr 3d Recon At Scanner  07/21/2014   CLINICAL DATA:  Epigastric pain, nausea and vomiting. Elevated lipase. Elevated liver function tests.  EXAM: MRI ABDOMEN WITHOUT  (INCLUDING MRCP)  TECHNIQUE: Multiplanar multisequence MR imaging of the abdomen was performed. Heavily T2-weighted images of the  biliary and pancreatic ducts were obtained, and three-dimensional MRCP images were rendered by post processing.  COMPARISON:  CT of the abdomen and pelvis 07/19/2014. Abdominal ultrasound 07/19/2014.  FINDINGS: The liver has a shrunken appearance and markedly nodular contour, compatible with cirrhosis. There is a lace-like pattern of T2 hyperintensity throughout the hepatic parenchyma, most significantly within the right lobe of the liver, compatible with areas of hepatic fibrosis. No discrete hypervascular hepatic lesion is identified on postcontrast images to suggest a hepatocellular carcinoma at this time. MRCP images demonstrate no intra or extrahepatic biliary ductal dilatation. Tiny signal voids are present in the dependent portion of the gallbladder, compatible with tiny gallstones. Gallbladder appears moderately distended, but gallbladder wall thickness is normal and there is no pericholecystic fluid or surrounding inflammatory changes to suggest an acute cholecystitis at this time. The pancreatic duct is also normal in caliber. The pancreas also appears grossly normal in appearance. Specifically, pancreatic parenchyma enhances uniformly. No pancreatic or peripancreatic fluid collections to suggest the presence of a pseudocyst at this time.  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 The spleen is enlarged measuring 14.3 x 8.2 x 14.9 cm (estimated splenic volume of 874 mL). There are multiple renal lesions bilaterally which demonstrate low T1 signal intensity, high T2 signal intensity, and do not enhance, compatible with simple cysts, with the largest simple cyst measuring 1.3 cm in the anterior aspect of the interpolar region of the left kidney. There are other small renal lesions that demonstrate high T1 signal intensity, low T2 signal intensity, and do not enhance, compatible with proteinaceous or hemorrhagic cysts, largest of which measures 12 mm in the interpolar region of the right kidney.  No other suspicious appearing renal lesions are noted. In addition, in the upper pole of the right kidney there is a 2.8 cm calyceal diverticulum.  IMPRESSION: 1. No intra or extrahepatic biliary ductal dilatation to suggest biliary tract obstruction at this time. 2. Cholelithiasis.  No evidence of acute cholecystitis at this time. 3. Morphologic changes in the liver compatible with cirrhosis with early hepatic fibrosis. There is also dilatation of the portal vein and splenomegaly, indicative of portal hypertension. 4. No acute abnormality of the pancreas. Specifically, no evidence of pancreatic necrosis, and no evidence of pancreatic inflammation or pseudocysts.  5. Additional incidental findings, as above.   Electronically Signed   By: Trudie Reed M.D.   On: 07/21/2014 15:18   Nm Myocar Multi W/spect W/wall Motion / Ef  07/23/2014   CLINICAL DATA:  Chest pain  EXAM: MYOCARDIAL IMAGING WITH SPECT (REST AND PHARMACOLOGIC-STRESS)  GATED LEFT VENTRICULAR WALL MOTION STUDY  LEFT VENTRICULAR EJECTION FRACTION  TECHNIQUE: Standard myocardial SPECT imaging was performed after resting intravenous injection of 10 mCi Tc-66m sestamibi. Subsequently, intravenous infusion of Lexiscan was performed under the supervision of the Cardiology staff. At peak effect of the drug, 30 mCi Tc-74m sestamibi was injected intravenously and standard myocardial SPECT imaging was performed. Quantitative gated imaging was also performed to evaluate left ventricular wall motion, and estimate left ventricular ejection fraction.  COMPARISON:  None.  FINDINGS: SPECT: There is a large fixed perfusion defect involving the inferior wall and apex. No definite stress-induced perfusion defect.  Wall motion:  Global hypokinesis  Ejection fraction: 19%. End-diastolic volume 343 cc. End systolic volume 278 cc.  IMPRESSION: No stress-induced ischemia.  Fixed defect involving the inferior wall and apex compatible with scar.  Global hypokinesis and a low  ejection fraction at 19%.   Electronically Signed   By: Maryclare Bean M.D.   On: 07/23/2014 12:30   Mr Jorja Loa Cm/mrcp  07/21/2014   CLINICAL DATA:  Epigastric pain, nausea and vomiting. Elevated lipase. Elevated liver function tests.  EXAM: MRI ABDOMEN WITHOUT  (INCLUDING MRCP)  TECHNIQUE: Multiplanar multisequence MR imaging of the abdomen was performed. Heavily T2-weighted images of the biliary and pancreatic ducts were obtained, and three-dimensional MRCP images were rendered by post processing.  COMPARISON:  CT of the abdomen and pelvis 07/19/2014. Abdominal ultrasound 07/19/2014.  FINDINGS: The liver has a shrunken appearance and markedly nodular contour, compatible with cirrhosis. There is a lace-like pattern of T2 hyperintensity throughout the hepatic parenchyma, most significantly within the right lobe of the liver, compatible with areas of hepatic fibrosis. No discrete hypervascular hepatic lesion is identified on postcontrast images to suggest a hepatocellular carcinoma at this time. MRCP images demonstrate no intra or extrahepatic biliary ductal dilatation. Tiny signal voids are present in the dependent portion of the gallbladder, compatible with tiny gallstones. Gallbladder appears moderately distended, but gallbladder wall thickness is normal and there is no pericholecystic fluid or surrounding inflammatory changes to suggest an acute cholecystitis at this time. The pancreatic duct is also normal in caliber. The pancreas also appears grossly normal in appearance. Specifically, pancreatic parenchyma enhances uniformly. No pancreatic or peripancreatic fluid collections to suggest the presence of a pseudocyst at this time.  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 The spleen is enlarged measuring 14.3 x 8.2 x 14.9 cm (estimated splenic volume of 874 mL). There are multiple renal lesions bilaterally which demonstrate low T1 signal intensity, high T2 signal intensity, and do not enhance,  compatible with simple cysts, with the largest simple cyst measuring 1.3 cm in the anterior aspect of the interpolar region of the left kidney. There are other small renal lesions that demonstrate high T1 signal intensity, low T2 signal intensity, and do not enhance, compatible with proteinaceous or hemorrhagic cysts, largest of which measures 12 mm in the interpolar region of the right kidney. No other suspicious appearing renal lesions are noted. In addition, in the upper pole of the right kidney there is a 2.8 cm calyceal diverticulum.  IMPRESSION: 1. No intra or extrahepatic biliary ductal dilatation to suggest biliary tract obstruction at  this time. 2. Cholelithiasis.  No evidence of acute cholecystitis at this time. 3. Morphologic changes in the liver compatible with cirrhosis with early hepatic fibrosis. There is also dilatation of the portal vein and splenomegaly, indicative of portal hypertension. 4. No acute abnormality of the pancreas. Specifically, no evidence of pancreatic necrosis, and no evidence of pancreatic inflammation or pseudocysts. 5. Additional incidental findings, as above.   Electronically Signed   By: Trudie Reed M.D.   On: 07/21/2014 15:18    Microbiology: Recent Results (from the past 240 hour(s))  CULTURE, BLOOD (ROUTINE X 2)     Status: None   Collection Time    07/18/14 11:45 PM      Result Value Ref Range Status   Specimen Description BLOOD LEFT ANTECUBITAL   Final   Special Requests BOTTLES DRAWN AEROBIC AND ANAEROBIC [facility] EACH   Final   Culture  Setup Time     Final   Value: 07/19/2014 03:43     Performed at [facility]   Culture     Final   Value: ESCHERICHIA COLI     Note: Gram Stain Report Called to,Read Back By and Verified With: JENNY BURNS 07/19/14 @ 9:49PM BY RUSCOE A.     Performed at [facility]   Report Status 07/21/2014 FINAL   Final   Organism ID, Bacteria ESCHERICHIA COLI   Final  CULTURE, BLOOD (ROUTINE X 2)     Status: None    Collection Time    07/18/14 11:56 PM      Result Value Ref Range Status   Specimen Description BLOOD LEFT HAND   Final   Special Requests BOTTLES DRAWN AEROBIC AND ANAEROBIC 6 C EACH   Final   Culture  Setup Time     Final   Value: 07/19/2014 03:42     Performed at [facility]   Culture     Final   Value:        BLOOD CULTURE RECEIVED NO GROWTH TO DATE CULTURE WILL BE HELD FOR 5 DAYS BEFORE ISSUING A FINAL NEGATIVE REPORT     Performed at [facility]   Report Status PENDING   Incomplete  URINE CULTURE     Status: None   Collection Time    07/19/14  8:23 AM      Result Value Ref Range Status   Specimen Description URINE, CLEAN CATCH   Final   Special Requests NONE   Final   Culture  Setup Time     Final   Value: 07/19/2014 15:18     Performed at [facility] Count     Final   Value: NO GROWTH     Performed at [facility]   Culture     Final   Value: NO GROWTH     Performed at [facility]   Report Status 07/20/2014 FINAL   Final     Labs: Basic Metabolic Panel:  Recent Labs Lab 07/18/14 2142 07/19/14 0451 07/20/14 0500 07/21/14 0427 07/22/14 0455 07/23/14 0350 07/24/14 0342  NA 133* 135* 134* 136* 134* 136* 139  K 4.2 3.8 3.6* 3.5* 3.8 4.0 4.1  CL 97 100 101 103 102 105 105  CO2 21 21 19 20 20 20 24   GLUCOSE 209* 200* 167* 134* 126* 127* 102*  BUN 56* 59* 53* 44* 45* 46* 41*  CREATININE 1.71* 1.92* 1.57* 1.40* 1.33 1.31 1.35  CALCIUM 8.5 7.9* 7.8* 8.1* 8.4  8.3* 8.5  MG  --  1.5 2.3  --   --   --   --   PHOS  --  3.6  --   --   --   --   --    Liver Function Tests:  Recent Labs Lab 07/20/14 0500 07/21/14 0427 07/22/14 0455 07/23/14 0350 07/24/14 0342  AST 50* 52* 50* 45* 44*  ALT 50 46 46 40 37  ALKPHOS 98 116 161* 191* 211*  BILITOT 7.2* 6.9* 6.7* 5.3* 4.3*  PROT 5.1* 4.9* 5.2* 4.9* 5.1*  ALBUMIN 2.2* 2.1* 2.0* 1.9* 2.0*    Recent Labs Lab 07/18/14 1647 07/18/14 2142 07/19/14 1344  07/20/14 0500 07/21/14 0427  LIPASE 679* 518* 147* 114* 136*    Recent Labs Lab 07/18/14 2356  AMMONIA 37   CBC:  Recent Labs Lab 07/18/14 1647 07/18/14 2142 07/19/14 0451 07/20/14 0500 07/21/14 0427 07/22/14 0455 07/23/14 0350  WBC 10.6* 8.7 5.9 4.1 3.4* 4.8 3.6*  NEUTROABS 10.0* 8.1*  --   --   --   --   --   HGB 11.3* 11.2* 10.4* 9.9* 9.6* 9.8* 9.5*  HCT 32.2* 33.5* 31.2* 29.2* 28.3* 29.8* 27.6*  MCV 82.8 85.0 85.7 83.9 84.5 84.9 83.1  PLT 57* 52* 49* 53* 46* 38* 42*   Cardiac Enzymes:  Recent Labs Lab 07/19/14 0805 07/19/14 1344 07/19/14 1942  TROPONINI 2.19* 2.26* 2.11*   BNP: BNP (last 3 results) No results found for this basename: PROBNP,  in the last 8760 hours CBG:  Recent Labs Lab 07/23/14 1646 07/23/14 2001 07/23/14 2348 07/24/14 0400 07/24/14 0746  GLUCAP 170* 168* 112* 106* 91       Signed:  Darryon Bastin A  [facility] 07/24/2014, 9:16 AM

## 2014-07-24 NOTE — Progress Notes (Signed)
I agree with the above documentation, including the assessment and plan. Pt was discharged home before I could see him today. Had cardiology clearance and surgery declined to proceed with cholecystectomy due to operative risk Pt remains at risk for recurrent CBD stone and is aware to seek care immediately for any recurrent pain, fevers. GI follow-up with Dr. Juanda Chance is scheduled

## 2014-07-24 NOTE — Progress Notes (Signed)
Patient ID: [name], male   DOB: 17-Nov-1961, 53 y.o.   MRN: [id number]   Pt being discharged today Surgery has decilned  To proceed with cholecytectomy Cleared from cardiac standpoint   Ecoli bacteremia- likely biliary source from passage of small stone Cholelithiasis Probable aspiration pneumonia Decompensated cirrhosis He will complete a course of Levaquin Made follow up appt with Dr. Juanda Chance on 9/15 at 11 am  He will see PCP  In interim for labs He is aware of possibility of recurrent biliary sxs , and have told him to return to the hospital for high fever, recurrent epiagstric  Pain with vomiting

## 2014-07-25 ENCOUNTER — Ambulatory Visit (INDEPENDENT_AMBULATORY_CARE_PROVIDER_SITE_OTHER): Payer: 59 | Admitting: Family Medicine

## 2014-07-25 ENCOUNTER — Encounter: Payer: Self-pay | Admitting: Family Medicine

## 2014-07-25 VITALS — BP 157/79 | HR 57 | Temp 97.7°F | Resp 18 | Ht 70.0 in | Wt 250.0 lb

## 2014-07-25 DIAGNOSIS — K769 Liver disease, unspecified: Secondary | ICD-10-CM

## 2014-07-25 DIAGNOSIS — K859 Acute pancreatitis without necrosis or infection, unspecified: Secondary | ICD-10-CM

## 2014-07-25 DIAGNOSIS — I428 Other cardiomyopathies: Secondary | ICD-10-CM

## 2014-07-25 DIAGNOSIS — J690 Pneumonitis due to inhalation of food and vomit: Secondary | ICD-10-CM

## 2014-07-25 DIAGNOSIS — K7030 Alcoholic cirrhosis of liver without ascites: Secondary | ICD-10-CM

## 2014-07-25 DIAGNOSIS — Z23 Encounter for immunization: Secondary | ICD-10-CM

## 2014-07-25 DIAGNOSIS — K7031 Alcoholic cirrhosis of liver with ascites: Secondary | ICD-10-CM

## 2014-07-25 DIAGNOSIS — A498 Other bacterial infections of unspecified site: Secondary | ICD-10-CM

## 2014-07-25 DIAGNOSIS — D696 Thrombocytopenia, unspecified: Secondary | ICD-10-CM

## 2014-07-25 DIAGNOSIS — R7881 Bacteremia: Secondary | ICD-10-CM

## 2014-07-25 DIAGNOSIS — I429 Cardiomyopathy, unspecified: Secondary | ICD-10-CM

## 2014-07-25 DIAGNOSIS — K7210 Chronic hepatic failure without coma: Secondary | ICD-10-CM

## 2014-07-25 DIAGNOSIS — K851 Biliary acute pancreatitis without necrosis or infection: Secondary | ICD-10-CM

## 2014-07-25 LAB — CULTURE, BLOOD (ROUTINE X 2): Culture: NO GROWTH

## 2014-07-25 NOTE — Progress Notes (Signed)
OFFICE NOTE  07/25/2014  CC:  Chief Complaint  Patient presents with  . Hospitalization Follow-up   HPI: Patient is a 53 y.o. Caucasian male who is here for hosp f/u.  Admitted 7/31 to 07/24/14 for biliary pancreatitis and e coli bacteremia, plus aspiration pneumonia and he was ultimately diagnosed with ischemic cardiomyopathy when he was being considered for clearance to get his gallbladder out.  See PMH/PSH section.  He ended up not getting his GB out. He was started on coreg and remains on 80m lisinopril.  He says he feels good but feels like his legs are really swollen, some abd bloating.  Says he got fluids in hosp and no diuretics.  EAting normal diet without problems. Gluc this am was 94, took lantus 38 U last night.     Pertinent PMH:  Past medical, surgical, social, and family history reviewed and no changes are noted since last office visit.  MEDS:  Outpatient Prescriptions Prior to Visit  Medication Sig Dispense Refill  . carvedilol (COREG) 3.125 MG tablet Take 1 tablet (3.125 mg total) by mouth 2 (two) times daily with a meal.  30 tablet  0  . furosemide (LASIX) 40 MG tablet Take 40 mg by mouth 2 (two) times daily.      . insulin aspart (NOVOLOG) 100 UNIT/ML injection Inject 10 Units into the skin 3 (three) times daily before meals.      . insulin glargine (LANTUS) 100 UNIT/ML injection Inject 38 Units into the skin at bedtime.      .Marland Kitchenlevofloxacin (LEVAQUIN) 500 MG tablet Take 1 tablet (500 mg total) by mouth daily.  9 tablet  0  . lisinopril (PRINIVIL,ZESTRIL) 10 MG tablet Take 1 tablet (10 mg total) by mouth daily.  30 tablet  0  . pantoprazole (PROTONIX) 40 MG tablet Take 1 tablet (40 mg total) by mouth daily.  30 tablet  3  . promethazine (PHENERGAN) 25 MG tablet Take 1 tablet (25 mg total) by mouth every 6 (six) hours as needed for nausea or vomiting.  30 tablet  1  . ranitidine (ZANTAC) 150 MG tablet Take 1 tablet (150 mg total) by mouth at bedtime.  30 tablet  3  .  spironolactone (ALDACTONE) 100 MG tablet Take 1 tablet (100 mg total) by mouth daily.  30 tablet  0  . glucagon 1 MG injection Inject 1 mg into the vein once as needed. For hypoglycemia  1 each  2  . promethazine (PHENERGAN) 25 MG suppository Place 1 suppository (25 mg total) rectally every 6 (six) hours as needed for nausea or vomiting.  12 each  1   No facility-administered medications prior to visit.   PE: Blood pressure 157/79, pulse 57, temperature 97.7 F (36.5 C), temperature source Temporal, resp. rate 18, height _0  (1.778 m), weight 250 lb (113.399 kg), SpO2 97.00%. Gen: Alert, well appearing.  Patient is oriented to person, place, time, and situation. Mild jaundice, mild scleral icterus Mouth: lips without lesion/swelling.  Oral mucosa pink and moist. Oropharynx without erythema, exudate, or swelling.  Neck - No masses or thyromegaly or limitation in range of motion CV: regular, distant S1 and S2, trace syst murmur at cardiac base, no rub or gallop.  Rate about 55-60 Chest is clear, no wheezing or rales. Normal symmetric air entry throughout both lung fields. No chest wall deformities or tenderness. ABD: rotund, +flank dullness, +shifting dullness, no tight distention, no tenderness.   No palpable organomegaly, no mass, no bruit. EXT:  3-4+ pitting edema up to knees bilat.  No clubbing or cyanosis   LAB:  Lab Results  Component Value Date   LIPASE 136* 07/21/2014     Chemistry      Component Value Date/Time   NA 139 07/24/2014 0342   K 4.1 07/24/2014 0342   CL 105 07/24/2014 0342   CO2 24 07/24/2014 0342   BUN 41* 07/24/2014 0342   CREATININE 1.35 07/24/2014 0342   CREATININE 1.87* 07/18/2014 1647      Component Value Date/Time   CALCIUM 8.5 07/24/2014 0342   ALKPHOS 211* 07/24/2014 0342   AST 44* 07/24/2014 0342   ALT 37 07/24/2014 0342   BILITOT 4.3* 07/24/2014 0342    Most recent Cr was 07/24/14 and it was 1.35  IMPRESSION AND PLAN:  1) Biliary/e coli bacteremia; improving  appropriately.  Finish 8 more days of abx (levaquin 500 qd).  2) Biliary pancreatitis; chemical pancreatitis (pancrease appeared normal on imaging), lipase trended down during hospitalization, and he is tolerating a regular diet w/out problem now.   Gallstones detected: it is surmised that he passed a small gallstone and it started all of this off. Gen surg declined to remove GB due to acute illness/infection + thrombocytopenia (chronic).   Will see if they will consider doing this surgery in the future when he feels well, with a platelet transfusion pre-surgery.  3) Cirrhosis with portal HTN/ascites: presumed alc cirrhosis but alcohol intake per pt history is not that remarkable. We'll see how his liver enzymes, Alk phos, bilirubin improve over the next few weeks. I have made a referral to hepatology at [facility] to see if he may be a candidate for liver transplant. He also has GI f/u at [facility] with Dr. Olevia Perches on 09/02/14. Hep B vaccine #1 given today.  4) CRI, stage III.  Cr went back to baseline (baseline 1.1-1.3)  by the day of d/c (yesterday).  5) Edema: combo of LE venous insuff, cirrhosis, and hypoalbuminemia: will restart lasix at 67m bid and aldactone at 1065mqd.  Recheck lytes/cr at f/u in 1 wk.  6) Aspiration pneumonia: he never had SOB or cough.  Finish levaquin.  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 Avoid NSAIDs.    8) Ischemic cardiomyopathy: coreg, ACE-I as per cardiology.  Keep cardiology f/u already scheduled for 08/15/14.  An After Visit Summary was printed and given to the patient.  FOLLOW UP: 1 week

## 2014-07-25 NOTE — Progress Notes (Signed)
Pre visit review using our clinic review tool, if applicable. No additional management support is needed unless otherwise documented below in the visit note. 

## 2014-07-25 NOTE — Telephone Encounter (Signed)
Spoke with patient and gave him appointment date, time and Mike Gip, [place] recommendations.

## 2014-07-28 ENCOUNTER — Other Ambulatory Visit: Payer: 59

## 2014-08-01 ENCOUNTER — Encounter: Payer: Self-pay | Admitting: Family Medicine

## 2014-08-01 ENCOUNTER — Ambulatory Visit (INDEPENDENT_AMBULATORY_CARE_PROVIDER_SITE_OTHER): Payer: 59 | Admitting: Family Medicine

## 2014-08-01 VITALS — BP 123/68 | HR 65 | Temp 98.2°F | Resp 18 | Ht 70.0 in | Wt 234.0 lb

## 2014-08-01 DIAGNOSIS — K7460 Unspecified cirrhosis of liver: Secondary | ICD-10-CM

## 2014-08-01 DIAGNOSIS — Z8719 Personal history of other diseases of the digestive system: Secondary | ICD-10-CM

## 2014-08-01 DIAGNOSIS — J690 Pneumonitis due to inhalation of food and vomit: Secondary | ICD-10-CM

## 2014-08-01 DIAGNOSIS — IMO0002 Reserved for concepts with insufficient information to code with codable children: Secondary | ICD-10-CM

## 2014-08-01 DIAGNOSIS — R188 Other ascites: Principal | ICD-10-CM

## 2014-08-01 DIAGNOSIS — D696 Thrombocytopenia, unspecified: Secondary | ICD-10-CM

## 2014-08-01 DIAGNOSIS — E1165 Type 2 diabetes mellitus with hyperglycemia: Secondary | ICD-10-CM

## 2014-08-01 DIAGNOSIS — E118 Type 2 diabetes mellitus with unspecified complications: Secondary | ICD-10-CM

## 2014-08-01 LAB — CBC WITH DIFFERENTIAL/PLATELET
BASOS ABS: 0.1 10*3/uL (ref 0.0–0.1)
Basophils Relative: 1.5 % (ref 0.0–3.0)
EOS PCT: 2.9 % (ref 0.0–5.0)
Eosinophils Absolute: 0.1 10*3/uL (ref 0.0–0.7)
HEMATOCRIT: 31.8 % — AB (ref 39.0–52.0)
Hemoglobin: 10.7 g/dL — ABNORMAL LOW (ref 13.0–17.0)
LYMPHS ABS: 0.5 10*3/uL — AB (ref 0.7–4.0)
LYMPHS PCT: 10.9 % — AB (ref 12.0–46.0)
MCHC: 33.6 g/dL (ref 30.0–36.0)
MCV: 86.5 fl (ref 78.0–100.0)
MONOS PCT: 9 % (ref 3.0–12.0)
Monocytes Absolute: 0.4 10*3/uL (ref 0.1–1.0)
Neutro Abs: 3.6 10*3/uL (ref 1.4–7.7)
Neutrophils Relative %: 75.7 % (ref 43.0–77.0)
Platelets: 91 10*3/uL — ABNORMAL LOW (ref 150.0–400.0)
RBC: 3.68 Mil/uL — ABNORMAL LOW (ref 4.22–5.81)
RDW: 15.1 % (ref 11.5–15.5)
WBC: 4.7 10*3/uL (ref 4.0–10.5)

## 2014-08-01 LAB — COMPREHENSIVE METABOLIC PANEL
ALT: 36 U/L (ref 0–53)
AST: 44 U/L — ABNORMAL HIGH (ref 0–37)
Albumin: 2.5 g/dL — ABNORMAL LOW (ref 3.5–5.2)
Alkaline Phosphatase: 215 U/L — ABNORMAL HIGH (ref 39–117)
BILIRUBIN TOTAL: 3 mg/dL — AB (ref 0.2–1.2)
BUN: 30 mg/dL — AB (ref 6–23)
CALCIUM: 9.3 mg/dL (ref 8.4–10.5)
CO2: 29 mEq/L (ref 19–32)
CREATININE: 1.2 mg/dL (ref 0.4–1.5)
Chloride: 99 mEq/L (ref 96–112)
GFR: 65.68 mL/min (ref >60.00)
Glucose, Bld: 70 mg/dL (ref 70–99)
Potassium: 4.1 mEq/L (ref 3.5–5.1)
Sodium: 134 mEq/L — ABNORMAL LOW (ref 135–145)
Total Protein: 5.3 g/dL — ABNORMAL LOW (ref 6.0–8.3)

## 2014-08-01 LAB — PROTIME-INR
INR: 1 ratio (ref 0.8–1.0)
PROTHROMBIN TIME: 11 s (ref 9.6–13.1)

## 2014-08-01 LAB — LIPASE: LIPASE: 85 U/L — AB (ref 11.0–59.0)

## 2014-08-01 NOTE — Progress Notes (Signed)
Pre visit review using our clinic review tool, if applicable. No additional management support is needed unless otherwise documented below in the visit note. 

## 2014-08-01 NOTE — Progress Notes (Signed)
OFFICE NOTE  08/01/2014  CC:  Chief Complaint  Patient presents with  . Follow-up   HPI: Patient is a 53 y.o. Caucasian male who is here for 1 week f/u cirrhosis with portal HTN, recent biliary pancreatitis and aspiration pneumonia.  Last visit we restarted his diuretics and he was to finish 8 more days of levaquin.    His wt is down 16 lbs over the last 1 wk, legs feel much better/no fluid. No GI complaints except hiccoughs after he eats.  He denies abd pain, denies SOB or cough.  No fever.  Glucoses: this morning 126.   Highest glucose since I last saw him was 212 "b/c I ate white bread sandwich".  Eating more fruits and veggies.    [facility]   Pertinent PMH:  Past medical, surgical, social, and family history reviewed and no changes are noted since last office visit.  MEDS:  Outpatient Prescriptions Prior to Visit  Medication Sig Dispense Refill  . carvedilol (COREG) 3.125 MG tablet Take 1 tablet (3.125 mg total) by mouth 2 (two) times daily with a meal.  30 tablet  0  . furosemide (LASIX) 40 MG tablet Take 40 mg by mouth 2 (two) times daily.      [name]. glucagon 1 MG injection Inject 1 mg into the vein once as needed. For hypoglycemia  1 each  2  . insulin aspart (NOVOLOG) 100 UNIT/ML injection Inject 10 Units into the skin 3 (three) times daily before meals.      . insulin glargine (LANTUS) 100 UNIT/ML injection Inject 38 Units into the skin at bedtime.      [name]. levofloxacin (LEVAQUIN) 500 MG tablet Take 1 tablet (500 mg total) by mouth daily.  9 tablet  0  . lisinopril (PRINIVIL,ZESTRIL) 10 MG tablet Take 1 tablet (10 mg total) by mouth daily.  30 tablet  0  . pantoprazole (PROTONIX) 40 MG tablet Take 1 tablet (40 mg total) by mouth daily.  30 tablet  3  . promethazine (PHENERGAN) 25 MG suppository Place 1 suppository (25 mg total) rectally every 6 (six) hours as needed for nausea or vomiting.  12 each  1  . promethazine (PHENERGAN) 25 MG tablet Take  1 tablet (25 mg total) by mouth every 6 (six) hours as needed for nausea or vomiting.  30 tablet  1  . ranitidine (ZANTAC) 150 MG tablet Take 1 tablet (150 mg total) by mouth at bedtime.  30 tablet  3  . spironolactone (ALDACTONE) 100 MG tablet Take 1 tablet (100 mg total) by mouth daily.  30 tablet  0   No facility-administered medications prior to visit.    PE: Blood pressure 123/68, pulse 65, temperature 98.2 F (36.8 C), temperature source Temporal, resp. rate 18, height 5\' 10"  (1.778 m), weight 234 lb (106.142 kg), SpO2 98.00%. Gen: Alert, well appearing.  Patient is oriented to person, place, time, and situation. Eyes: minimal, if any, icterus. CV: RRR, no m/r/g.   LUNGS: CTA bilat, nonlabored resps, good aeration in all lung fields. ABD: rotund, no tight distention.  Nontender. EXT: 1+ pitting edema in both LL's  IMPRESSION AND PLAN:  1) Cirrhosis with portal HTN: peripheral edema improved with restart of lasix 40mg  bid and aldactone 100mg  qd. Hx of thrombocytopenia secondary to this as well, recheck platelets today. Has f/u with local GI in September (Dr. Juanda ChanceBrodie). [facility] .  2) Aspiration pneumonia: resolving  appropriately.  Finish up abx.  3) Recent biliary pancreatitis: eating well, no pain.  Recheck lipase today.  4) CRI, stage III: recheck lytes/cr today.  5) Ischemic cardiomyopathy: has cardiology f/u later this month.  Continue the ACE-I he was recently started on.  6) DM 2, hx of erratic control.  This erratic control is particularly due to his irregular dietary practices when he works anything other than the dayshift at his job.  I have written on work paperwork that I recommend he return to work at full duty but I recommend day shift only.  An After Visit Summary was printed and given to the patient.  FOLLOW UP: 6 wks.

## 2014-08-08 ENCOUNTER — Other Ambulatory Visit: Payer: Self-pay | Admitting: Family Medicine

## 2014-08-08 MED ORDER — CARVEDILOL 3.125 MG PO TABS: 3.1250 mg | ORAL_TABLET | Freq: Two times a day (BID) | ORAL | Status: DC

## 2014-08-13 ENCOUNTER — Ambulatory Visit: Payer: 59 | Admitting: Family Medicine

## 2014-08-15 ENCOUNTER — Encounter: Payer: 59 | Admitting: Cardiology

## 2014-08-19 HISTORY — PX: EYE SURGERY: SHX253

## 2014-08-29 ENCOUNTER — Other Ambulatory Visit: Payer: Self-pay | Admitting: Family Medicine

## 2014-08-29 NOTE — Telephone Encounter (Signed)
Can we refill his spirolactone? Please advise

## 2014-08-29 NOTE — Telephone Encounter (Addendum)
Patient will run out of one of the meds today & the other tomorrow

## 2014-08-30 MED ORDER — SPIRONOLACTONE 100 MG PO TABS: 100.0000 mg | ORAL_TABLET | Freq: Every day | ORAL | Status: DC

## 2014-09-02 ENCOUNTER — Ambulatory Visit: Payer: 59 | Admitting: Internal Medicine

## 2014-09-10 ENCOUNTER — Ambulatory Visit: Payer: 59 | Admitting: Family Medicine

## 2014-09-10 ENCOUNTER — Other Ambulatory Visit: Payer: Self-pay | Admitting: Family Medicine

## 2014-09-10 ENCOUNTER — Ambulatory Visit (INDEPENDENT_AMBULATORY_CARE_PROVIDER_SITE_OTHER): Payer: 59 | Admitting: Nurse Practitioner

## 2014-09-10 ENCOUNTER — Encounter: Payer: Self-pay | Admitting: Nurse Practitioner

## 2014-09-10 VITALS — BP 113/69 | HR 73 | Temp 98.3°F | Resp 18 | Ht 70.0 in | Wt 225.0 lb

## 2014-09-10 DIAGNOSIS — R1110 Vomiting, unspecified: Secondary | ICD-10-CM

## 2014-09-10 DIAGNOSIS — R1111 Vomiting without nausea: Secondary | ICD-10-CM

## 2014-09-10 DIAGNOSIS — Z23 Encounter for immunization: Secondary | ICD-10-CM

## 2014-09-10 MED ORDER — INSULIN ASPART 100 UNIT/ML ~~LOC~~ SOLN: 10.0000 [IU] | Freq: Three times a day (TID) | SUBCUTANEOUS | Status: DC

## 2014-09-10 NOTE — Progress Notes (Signed)
Pre visit review using our clinic review tool, if applicable. No additional management support is needed unless otherwise documented below in the visit note. 

## 2014-09-10 NOTE — Progress Notes (Signed)
Subjective:     [name] is a 53 y.o. male presents w/c/o emesis times 1 episode, 2 da. He states he was at work, got "over-heated", drank some water, & immediately vomited. His employer asked him to go home. Today he is asking for note to return to work. He states he feels well & has not had another episode of emesis or nausea. He denies abd pain, diarrhea, constipation, fever. He is eating & drinking with no nausea or vomiting. He has multiple chronic illnesses including pancreatitis, CKD, cirrhosis. He saw hepatologist several weeks ago & has an appt w/GI next mo.  The following portions of the patient's history were reviewed and updated as appropriate: allergies, current medications, past medical history, past social history, past surgical history and problem list.  Review of Systems Pertinent items are noted in HPI.    Objective:    BP 113/69  Pulse 73  Temp(Src) 98.3 F (36.8 C) (Oral)  Resp 18  Ht 5\' 10"  (1.778 m)  Wt 225 lb (102.059 kg)  BMI 32.28 kg/m2  SpO2 100% BP 113/69  Pulse 73  Temp(Src) 98.3 F (36.8 C) (Oral)  Resp 18  Ht 5\' 10"  (1.778 m)  Wt 225 lb (102.059 kg)  BMI 32.28 kg/m2  SpO2 100% General appearance: alert, cooperative, appears stated age and no distress Head: Normocephalic, without obvious abnormality, atraumatic Eyes: positive findings: sclera mild jaundice Lungs: clear to auscultation bilaterally Heart: regular rate and rhythm, S1, S2 normal, no murmur, click, rub or gallop Abdomen: soft, non-tender; bowel sounds normal; no masses,  no organomegaly    Assessment:     1. Non-intractable vomiting without nausea, vomiting of unspecified type    resolved.      Plan:     Keep appt w/Dr [name] next week.

## 2014-09-10 NOTE — Patient Instructions (Addendum)
Glad you are feeling better. OK to return to work.  Please See Dr. [name] 10/1.  Please keep appointment with Dr Juanda Chance next month.

## 2014-09-12 ENCOUNTER — Telehealth: Payer: Self-pay | Admitting: Family Medicine

## 2014-09-12 NOTE — Telephone Encounter (Signed)
Pt LMOM stating that his novolog is supposed to be sent as a flexpen.  I spoke to pharmacist at [facility] and had them change the RX.

## 2014-09-16 ENCOUNTER — Other Ambulatory Visit: Payer: Self-pay

## 2014-09-16 MED ORDER — INSULIN GLARGINE 100 UNIT/ML ~~LOC~~ SOLN: 38.0000 [IU] | Freq: Every day | SUBCUTANEOUS | Status: DC

## 2014-09-18 ENCOUNTER — Encounter: Payer: Self-pay | Admitting: Family Medicine

## 2014-09-18 ENCOUNTER — Ambulatory Visit: Payer: 59 | Admitting: Family Medicine

## 2014-09-18 ENCOUNTER — Ambulatory Visit (INDEPENDENT_AMBULATORY_CARE_PROVIDER_SITE_OTHER): Payer: 59 | Admitting: Family Medicine

## 2014-09-18 VITALS — BP 112/71 | HR 66 | Temp 97.8°F | Ht 70.0 in | Wt 229.0 lb

## 2014-09-18 DIAGNOSIS — D696 Thrombocytopenia, unspecified: Secondary | ICD-10-CM

## 2014-09-18 DIAGNOSIS — N2889 Other specified disorders of kidney and ureter: Secondary | ICD-10-CM

## 2014-09-18 DIAGNOSIS — IMO0002 Reserved for concepts with insufficient information to code with codable children: Secondary | ICD-10-CM

## 2014-09-18 DIAGNOSIS — R61 Generalized hyperhidrosis: Secondary | ICD-10-CM

## 2014-09-18 DIAGNOSIS — E1165 Type 2 diabetes mellitus with hyperglycemia: Secondary | ICD-10-CM

## 2014-09-18 DIAGNOSIS — E118 Type 2 diabetes mellitus with unspecified complications: Secondary | ICD-10-CM

## 2014-09-18 DIAGNOSIS — N182 Chronic kidney disease, stage 2 (mild): Secondary | ICD-10-CM

## 2014-09-18 DIAGNOSIS — K7460 Unspecified cirrhosis of liver: Secondary | ICD-10-CM

## 2014-09-18 DIAGNOSIS — K7581 Nonalcoholic steatohepatitis (NASH): Secondary | ICD-10-CM

## 2014-09-18 DIAGNOSIS — N189 Chronic kidney disease, unspecified: Secondary | ICD-10-CM

## 2014-09-18 HISTORY — PX: OTHER SURGICAL HISTORY: SHX169

## 2014-09-18 HISTORY — PX: CORONARY ANGIOPLASTY WITH STENT PLACEMENT: SHX49

## 2014-09-18 LAB — LIPASE: Lipase: 89 U/L — ABNORMAL HIGH (ref 11.0–59.0)

## 2014-09-18 LAB — COMPREHENSIVE METABOLIC PANEL
ALK PHOS: 113 U/L (ref 39–117)
ALT: 34 U/L (ref 0–53)
AST: 30 U/L (ref 0–37)
Albumin: 3.6 g/dL (ref 3.5–5.2)
BUN: 70 mg/dL — ABNORMAL HIGH (ref 6–23)
CO2: 25 mEq/L (ref 19–32)
CREATININE: 1.8 mg/dL — AB (ref 0.4–1.5)
Calcium: 8.9 mg/dL (ref 8.4–10.5)
Chloride: 104 mEq/L (ref 96–112)
GFR: 43.42 mL/min — AB (ref >60.00)
GLUCOSE: 114 mg/dL — AB (ref 70–99)
Potassium: 5.2 mEq/L — ABNORMAL HIGH (ref 3.5–5.1)
SODIUM: 135 meq/L (ref 135–145)
TOTAL PROTEIN: 6.2 g/dL (ref 6.0–8.3)
Total Bilirubin: 0.7 mg/dL (ref 0.2–1.2)

## 2014-09-18 LAB — CBC WITH DIFFERENTIAL/PLATELET
Basophils Absolute: 0 10*3/uL (ref 0.0–0.1)
Basophils Relative: 0.5 % (ref 0.0–3.0)
EOS PCT: 4.2 % (ref 0.0–5.0)
Eosinophils Absolute: 0.1 10*3/uL (ref 0.0–0.7)
HEMATOCRIT: 29.4 % — AB (ref 39.0–52.0)
HEMOGLOBIN: 10.3 g/dL — AB (ref 13.0–17.0)
LYMPHS ABS: 0.6 10*3/uL — AB (ref 0.7–4.0)
Lymphocytes Relative: 17.6 % (ref 12.0–46.0)
MCHC: 35 g/dL (ref 30.0–36.0)
MCV: 87.7 fl (ref 78.0–100.0)
Monocytes Absolute: 0.3 10*3/uL (ref 0.1–1.0)
Monocytes Relative: 9.4 % (ref 3.0–12.0)
Neutro Abs: 2.4 10*3/uL (ref 1.4–7.7)
Neutrophils Relative %: 68.3 % (ref 43.0–77.0)
PLATELETS: 31 10*3/uL — AB (ref 150.0–400.0)
RBC: 3.35 Mil/uL — ABNORMAL LOW (ref 4.22–5.81)
RDW: 15.2 % (ref 11.5–15.5)
WBC: 3.5 10*3/uL — AB (ref 4.0–10.5)

## 2014-09-18 LAB — PROTIME-INR
INR: 0.9 ratio (ref 0.8–1.0)
PROTHROMBIN TIME: 10.5 s (ref 9.6–13.1)

## 2014-09-18 NOTE — Assessment & Plan Note (Signed)
Recent [facility] hepatologist eval: pt not candidate for liver transplant. They also feel he is a poor candidate to get his gallbladder removed. I communicated this to the patient today. He has local GI follow up with Dr. Juanda Chance soon. Continue lasix and aldactone.

## 2014-09-18 NOTE — Assessment & Plan Note (Signed)
Baseline CrCl in the 60s. Check BMET today.

## 2014-09-18 NOTE — Assessment & Plan Note (Addendum)
Reassured pt. I don't think this is anything cardiac-related. Continue carvedolol, lasix, lisinopril, aldactone.  No ASA since platelets chronically low. We'll reschedule his cardiology post-hospital f/u since he had to miss this appointment.

## 2014-09-18 NOTE — Progress Notes (Signed)
OFFICE VISIT  09/18/2014   CC:  Chief Complaint  Patient presents with  . Follow-up    6 week   HPI:    Patient is a 53 y.o. Caucasian male who presents for routine chronic illness f/u. Has first appt with endo tomorrow.  Since I last saw him he [facility] and he was determined to NOT BE A LIVER TRANSPLANT CANDIDATE.  The hepatologist also said he would be a poor candidate to get his gallbladder surgically removed (has gallstones and hx of gallstone pancreatitis).  On 08/28/14 he was diagnosed with retinal hemorrhage and had surgery on right eye--this saved his vision.  Last 8d has noted excessive sweating with work--his work involves lifting, pushing, pulling, walking up/down stairs, bending--quite exertional: denies chest pain, feels some SOB at peak of activity, +some lightheaded feeling only when he looks up, some nausea at this time as well with a bit of vertigo.  NO jaw or arm pain.  Feels fatigued more than usual when working and after work. Glucoses no different than his "usual" :80s to 140s with working 3-11 pm shift lately.  His post-hospitalization f/u with cardiologist had to be postponed b/e of one of his recent other medical problems.  Past Medical History  Diagnosis Date  . Heart murmur     ECHO 07/2011 showed mild AS and LVH.  TEE 09/2011 showed small PFO, bicuspid aortic valve but no stenosis or regurg.  [name]. 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  Gen surg and GI recommended flagyl and cipro x 2 wks..  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  . Cholelithiasis     u/s--no cholecystitis.  06/2014 u/s showed no stones, only GB sludge  . Peripheral neuropathy     Diabetic:  Autonomic (pelvic) and LE polyneuropathy--[facility] testing showed that it is likely from DM  . Diabetic foot ulcers     Poor  healing; normal ABIs and TBIs  . Arthritis     back L3 - L5  . Hyperactive gag reflex   . History of pyelonephritis   . Type II or unspecified type diabetes mellitus with neurological manifestations, not stated as uncontrolled     IDDM: neuro, renal, and ophth complications  . Wears dentures     upper  . Osteomyelitis of toe of left foot     left 4th and 5th toes--now s/p amputation of these areas  . Diabetic retinopathy associated with type 2 diabetes mellitus     Laser tx in both eyes (Dr. [name])  . Diabetic nephropathy     Proteinuria and CrCl 55-65  . Chronic renal insufficiency, stage III (moderate)   . Lumbar spondylosis     MRI 08/25/11 with multilevel facet dz, small disc protrusion at L5-S1 to the right without impingement  . Ischemic cardiomyopathy 07/22/14    Myoview w/out ischemia but large scar (inferior/apex) w/ global hypokinesis--medical mgmt  . History of pancreatitis 2014 and 2015    Suspected biliary: passing small gallstones: surgery declined to remove GB 07/2014.  . Aspiration pneumonia   . Thrombocytopenia   . Portal hypertension   . Liver cirrhosis secondary to NASH     ?+ alcohol?.  With hx of splenomegaly and ascities (dx approx 2012)  . History of gram negative sepsis 2015    e coli  Past Surgical History  Procedure Laterality Date  . Knee arthroscopy  2001    Bilateral  . Amputation  10/24/2012    Procedure: AMPUTATION RAY;  Surgeon: Sherri Rad, MD;  Location: [facility];  Service: Orthopedics;  Laterality: Left;  left 4th toe amputation through MTP joint, 5th Ray amputation   . Knee surgery  2001  . Hida scan  06/2014    Normal  . Transthoracic echocardiogram  07/19/14    Mild LV dilation and hypertrophy, EF 35-40%, no wall motion abnormalities, grade II diast dysfxn, bicuspid aortic valve with mild AS and mild AI.  [name] Cardiovascular stress test  07/2014    Myoview w/out ischemia but large scar (inferior/apex) w/ global  hypokinesis--medical mgmt  . Eye surgery  08/2014    Retinal hemorrhage evacuation   MEDS: not taking levaquin listed below Outpatient Prescriptions Prior to Visit  Medication Sig Dispense Refill  . carvedilol (COREG) 3.125 MG tablet Take 1 tablet (3.125 mg total) by mouth 2 (two) times daily with a meal.  30 tablet  3  . furosemide (LASIX) 40 MG tablet TAKE 1 TABLET TWICE A DAY  60 tablet  3  . glucagon 1 MG injection Inject 1 mg into the vein once as needed. For hypoglycemia  1 each  2  . insulin aspart (NOVOLOG) 100 UNIT/ML injection Inject 10 Units into the skin 3 (three) times daily before meals.  10 mL  3  . insulin glargine (LANTUS) 100 UNIT/ML injection Inject 0.38 mLs (38 Units total) into the skin at bedtime.  10 mL  2  . lisinopril (PRINIVIL,ZESTRIL) 10 MG tablet Take 1 tablet (10 mg total) by mouth daily.  30 tablet  0  . pantoprazole (PROTONIX) 40 MG tablet Take 1 tablet (40 mg total) by mouth daily.  30 tablet  3  . prednisoLONE acetate (PRED FORTE) 1 % ophthalmic suspension 1 drop 4 (four) times daily.      . promethazine (PHENERGAN) 25 MG tablet Take 1 tablet (25 mg total) by mouth every 6 (six) hours as needed for nausea or vomiting.  30 tablet  1  . ranitidine (ZANTAC) 150 MG tablet Take 1 tablet (150 mg total) by mouth at bedtime.  30 tablet  3  . spironolactone (ALDACTONE) 100 MG tablet Take 1 tablet (100 mg total) by mouth daily.  30 tablet  6  . furosemide (LASIX) 40 MG tablet Take 40 mg by mouth 2 (two) times daily.      [name] levofloxacin (LEVAQUIN) 500 MG tablet Take 1 tablet (500 mg total) by mouth daily.  9 tablet  0  . promethazine (PHENERGAN) 25 MG suppository Place 1 suppository (25 mg total) rectally every 6 (six) hours as needed for nausea or vomiting.  12 each  1   No facility-administered medications prior to visit.    Allergies  Allergen Reactions  . Hydralazine Hcl Hives  . Naproxen Nausea And Vomiting  . Other Itching and Other (See Comments)    PERFUMES  - SNEEZING    ROS As per HPI  PE: Blood pressure 112/71, pulse 66, temperature 97.8 F (36.6 C), temperature source Temporal, height 5\' 10"  (1.778 m), weight 229 lb (103.874 kg), SpO2 99.00%.  Wt is up 4 lbs in the last 8 days Gen: Alert, well appearing.  Patient is oriented to person, place, time, and situation. EVO:JJKK: no injection, icteris, swelling, or exudate.  EOMI, PERRLA. Mouth: lips without lesion/swelling.  Oral mucosa pink and moist.  Oropharynx without erythema, exudate, or swelling.  CV: RRR, no m/r/g.   LUNGS: CTA bilat, nonlabored resps, good aeration in all lung fields. ABD: rotund as per usual but not distended.  Nontender. EXT: trace bilat LE pitting edema in ankles.  LABS:  12 lead EKG today: sinus rhythm, PR interval 202 ms, HR 66/min, old inferior infarct and old anterior infarct, old nonspecific intraventricular conduction delay.  The only change since prior tracing is a slower HR and wider PR interval (since being started on b-blocker).         IMPRESSION AND PLAN:  Diaphoresis Reassured pt. I don't think this is anything cardiac-related. Continue carvedolol, lasix, lisinopril, aldactone.  No ASA since platelets chronically low. We'll reschedule his cardiology post-hospital f/u since he had to miss this appointment.  Diabetes mellitus type 2, uncontrolled, with complications As usual, he has seen good/stable glucose control while working daytime shift lately. He has his initial endocrinology visit tomorrow with Dr. Elvera Lennox.  Liver cirrhosis secondary to NASH Recent [facility] hepatologist eval: pt not candidate for liver transplant. They also feel he is a poor candidate to get his gallbladder removed. I communicated this to the patient today. He has local GI follow up with Dr. Juanda Chance soon. Continue lasix and aldactone.  Chronic renal insufficiency, stage II (mild) Baseline CrCl in the 60s. Check BMET today.   An After Visit Summary was printed and  given to the patient.  FOLLOW UP: Return in about 4 months (around 01/19/2015) for routine chronic illness f/u.

## 2014-09-18 NOTE — Assessment & Plan Note (Signed)
As usual, he has seen good/stable glucose control while working daytime shift lately. He has his initial endocrinology visit tomorrow with Dr. Elvera Lennox.

## 2014-09-18 NOTE — Progress Notes (Signed)
Pre visit review using our clinic review tool, if applicable. No additional management support is needed unless otherwise documented below in the visit note. 

## 2014-09-19 ENCOUNTER — Ambulatory Visit (INDEPENDENT_AMBULATORY_CARE_PROVIDER_SITE_OTHER): Payer: 59 | Admitting: Internal Medicine

## 2014-09-19 ENCOUNTER — Encounter: Payer: Self-pay | Admitting: Internal Medicine

## 2014-09-19 VITALS — BP 122/66 | HR 72 | Temp 98.6°F | Resp 12 | Ht 70.0 in | Wt 230.0 lb

## 2014-09-19 DIAGNOSIS — E1165 Type 2 diabetes mellitus with hyperglycemia: Secondary | ICD-10-CM

## 2014-09-19 DIAGNOSIS — IMO0002 Reserved for concepts with insufficient information to code with codable children: Secondary | ICD-10-CM

## 2014-09-19 DIAGNOSIS — E118 Type 2 diabetes mellitus with unspecified complications: Secondary | ICD-10-CM

## 2014-09-19 NOTE — Patient Instructions (Addendum)
We cannot switch to an insulin pump as the insulin will degrade if you work in a hot environment.  Please continue: - Lantus 38 units at bedtime - Novolog 10 units 3x a day 15 min before meals - NovoLog sliding scale as you already do  Please switch the Lantus to am when you work nights - in the following way:   am lunch bedtime        Night Lantus          Night... Lantus          Night  Lantus      PM   Lantus     PM...   Lantus     PM   Lantus  AM   Lantus  AM...   Lantus  AM  Lantus        Night Lantus         Night... Lantus         Night  Lantus

## 2014-09-19 NOTE — Progress Notes (Signed)
Patient ID: [name], male   DOB: [birth year], 53 y.o.   MRN: [id number]  HPI: [name] is a 53 y.o.-year-old male, referred by his PCP, Dr. [name], for management of DM2, insulin-dependent, uncontrolled, with complications (CRI stage 2, ischemic cardiomyopathy, PN, DR, L 4 and 5th toes amputated 2/2 osteomyelitis - 09/2012).  Patient has been diagnosed with diabetes in 2012; he started insulin in at dx. Last hemoglobin A1c was: Lab Results  Component Value Date   HGBA1C 7.2* 07/19/2014   HGBA1C 7.4* 06/26/2014   HGBA1C 7.2* 04/03/2014   Pt is on a regimen of: - Lantus 38 units qhs - Novolog 10 units tid ac - NovoLog SSI - target 130, ISF ? Previously on Victoza.  Pt checks his sugars 3x a day and they are: - am: 86-126 - 2h after b'fast: n/c - before lunch: 68 (when works nights)-136 - 2h after lunch: n/c - before dinner: 126-137 - 2h after dinner: n/c - bedtime: 120-130s - nighttime: n/c Has lows when works night - seldom. Lowest sugar was 68; he has hypoglycemia awareness at 80.  Highest sugar was 199.  He works 2 weeks shifts: - nights 11 pm - 7:30 am - pm: 3 pm - 11:30 pm - am: 7 am - 3:30 pm  Pt's meals are: - Breakfast: yoghurt + fruit; PB + apple; eggs + toast - Lunch: leftovers or sandwich - Dinner: meat + starch (sometimes) + vegetables - Snacks: PB crackers, no soft drinks  Exercises 3x a week.  - Has CKD, last BUN/creatinine:  Lab Results  Component Value Date   BUN 70* 09/18/2014   CREATININE 1.8* 09/18/2014  On Lisinopril - last set of lipids: Lab Results  Component Value Date   CHOL 135 07/19/2014   HDL 18* 07/19/2014   LDLCALC 91 07/19/2014   TRIG 131 07/19/2014   CHOLHDL 7.5 07/19/2014   - last eye exam was in 08/28/2014. + DR. He had surgery in 08/2014. - + numbness and tingling in his feet.  Pt has FH of DM in sister.  Pt has a h/o pancreatitis (gall bladder stones), cirrhosis (NASH).  ROS: Constitutional: no weight gain/loss, no fatigue,  + subjective hyperthermia, + poor sleep Eyes: no blurry vision, no xerophthalmia ENT: no sore throat, no nodules palpated in throat, no dysphagia/odynophagia, no hoarseness Cardiovascular: no CP/+ SOB/no palpitations/leg swelling Respiratory: no cough/+ SOB Gastrointestinal: no N/V/D/C Musculoskeletal: no muscle/+ joint aches Skin: no rashes Neurological: no tremors/numbness/tingling/dizziness Psychiatric: no depression/anxiety + Low libido, diff with erections  Past Medical History  Diagnosis Date  . Heart murmur     ECHO 07/2011 showed mild AS and LVH.  TEE 09/2011 showed small PFO, bicuspid aortic valve but no stenosis or regurg.  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  Gen surg and GI recommended flagyl and cipro x 2 wks..  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  . Cholelithiasis     u/s--no cholecystitis.  06/2014 u/s showed no stones, only GB sludge  . Peripheral neuropathy     Diabetic:  Autonomic (pelvic) and LE polyneuropathy--[facility] testing showed that it is likely from DM  . Diabetic foot ulcers     Poor healing; normal ABIs and TBIs  . Arthritis     back L3 - L5  . Hyperactive gag reflex   . History  of pyelonephritis   . Type II or unspecified type diabetes mellitus with neurological manifestations, not stated as uncontrolled     IDDM: neuro, renal, and ophth complications  . Wears dentures     upper  . Osteomyelitis of toe of left foot     left 4th and 5th toes--now s/p amputation of these areas  . Diabetic retinopathy associated with type 2 diabetes mellitus     Laser tx in both eyes (Dr. Allyne Gee)  . Diabetic nephropathy     Proteinuria and CrCl 55-65  . Chronic renal insufficiency, stage III (moderate)   . Lumbar spondylosis     MRI 08/25/11 with multilevel facet dz, small disc protrusion at L5-S1 to  the right without impingement  . Ischemic cardiomyopathy 07/22/14    Myoview w/out ischemia but large scar (inferior/apex) w/ global hypokinesis--medical mgmt  . History of pancreatitis 2014 and 2015    Suspected biliary: passing small gallstones: surgery declined to remove GB 07/2014.  . Aspiration pneumonia   . Thrombocytopenia   . Portal hypertension   . Liver cirrhosis secondary to NASH     ?+ alcohol?.  With hx of splenomegaly and ascities (dx approx 2012)  . History of gram negative sepsis 2015    e coli   Past Surgical History  Procedure Laterality Date  . Knee arthroscopy  2001    Bilateral  . Amputation  10/24/2012    Procedure: AMPUTATION RAY;  Surgeon: Sherri Rad, MD;  Location: [facility];  Service: Orthopedics;  Laterality: Left;  left 4th toe amputation through MTP joint, 5th Ray amputation   . Knee surgery  2001  . Hida scan  06/2014    Normal  . Transthoracic echocardiogram  07/19/14    Mild LV dilation and hypertrophy, EF 35-40%, no wall motion abnormalities, grade II diast dysfxn, bicuspid aortic valve with mild AS and mild AI.  [name] Cardiovascular stress test  07/2014    Myoview w/out ischemia but large scar (inferior/apex) w/ global hypokinesis--medical mgmt  . Eye surgery  08/2014    Retinal hemorrhage evacuation   History   Social History  . Marital Status: Single    Spouse Name: N/A    Number of Children: 0   Occupational History  . worker   Social History Main Topics  . Smoking status: Never Smoker   . Smokeless tobacco: Current User    Types: Chew  . Alcohol Use: No     Comment: Quit drinking alcohol 08/20/2011  . Drug Use: No   Social History Narrative   Single, no children.  Lives in [place], relocated from [place], [place] in 2008.   Chews tobacco but does not smoke.   Drinks 12 Beers daily---patient did not initially report/admit this--as of 2013 he has quit this.   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   Current Outpatient Prescriptions on File Prior to Visit  Medication Sig Dispense Refill  . carvedilol (COREG) 3.125 MG tablet Take 1 tablet (3.125 mg total) by mouth 2 (two) times daily with a meal.  30 tablet  3  . furosemide (LASIX) 40 MG tablet TAKE 1 TABLET TWICE A DAY  60 tablet  3  . glucagon 1 MG injection Inject 1 mg into the vein once as needed. For hypoglycemia  1 each  2  . insulin aspart (NOVOLOG) 100 UNIT/ML injection Inject 10 Units into the skin 3 (three) times daily before meals.  10  mL  3  . insulin glargine (LANTUS) 100 UNIT/ML injection Inject 0.38 mLs (38 Units total) into the skin at bedtime.  10 mL  2  . lisinopril (PRINIVIL,ZESTRIL) 10 MG tablet Take 1 tablet (10 mg total) by mouth daily.  30 tablet  0  . pantoprazole (PROTONIX) 40 MG tablet Take 1 tablet (40 mg total) by mouth daily.  30 tablet  3  . prednisoLONE acetate (PRED FORTE) 1 % ophthalmic suspension 1 drop 4 (four) times daily.      . promethazine (PHENERGAN) 25 MG suppository Place 1 suppository (25 mg total) rectally every 6 (six) hours as needed for nausea or vomiting.  12 each  1  . promethazine (PHENERGAN) 25 MG tablet Take 1 tablet (25 mg total) by mouth every 6 (six) hours as needed for nausea or vomiting.  30 tablet  1  . ranitidine (ZANTAC) 150 MG tablet Take 1 tablet (150 mg total) by mouth at bedtime.  30 tablet  3  . spironolactone (ALDACTONE) 100 MG tablet Take 1 tablet (100 mg total) by mouth daily.  30 tablet  6   No current facility-administered medications on file prior to visit.   Allergies  Allergen Reactions  . Hydralazine Hcl Hives  . Naproxen Nausea And Vomiting  . Other Itching and Other (See Comments)    PERFUMES - SNEEZING   Family History  Problem Relation Age of Onset  . Heart disease Father   . Diabetes type II Sister    PE: BP 122/66  Pulse 72  Temp(Src) 98.6 F (37 C) (Oral)  Resp 12  Ht 5\' 10"  (1.778 m)  Wt 230 lb (104.327 kg)  BMI 33.00 kg/m2  SpO2  99% Wt Readings from Last 3 Encounters:  09/19/14 230 lb (104.327 kg)  09/18/14 229 lb (103.874 kg)  09/10/14 225 lb (102.059 kg)   Constitutional: overweight, in NAD Eyes: PERRLA, EOMI, no exophthalmos ENT: moist mucous membranes, no thyromegaly, no cervical lymphadenopathy Cardiovascular: RRR, No MRG Respiratory: CTA B Gastrointestinal: abdomen soft, NT, ND, BS+ Musculoskeletal: no deformities, strength intact in all 4 Skin: moist, warm, no rashes Neurological: no tremor with outstretched hands, DTR normal in all 4  ASSESSMENT: 1. DM2, insulin-dependent, controlled, with complications - CRI stage 2 - Cardiomyopathy - PN - DR - L 4 and 5th toes amputated 2/2 osteomyelitis - 09/2012  PLAN:  1. Patient with long-standing, controlled diabetes, on basal-bolus regimen, which works well for him. He asks about an insulin pump, but this is impossible as he works in a hot environment (>36F). He has rare low sugars - when working nights >> advised to move Lantus to am when he dose this:   Patient Instructions   We cannot switch to an insulin pump as the insulin will degrade if you work in a hot environment.  Please continue: - Lantus 38 units at bedtime - Novolog 10 units 3x a day 15 min before meals - NovoLog sliding scale as you already do  Please switch the Lantus to am when you work nights - in the following way:   am lunch bedtime        Night Lantus          Night... Lantus          Night  Lantus      PM   Lantus     PM...   Lantus     PM   Lantus  AM   Lantus  AM...   Lantus  AM  Lantus        Night Lantus         Night... Lantus         Night  Lantus    - continue checking sugars at different times of the day - check 2 times a day, rotating checks - given sugar log and advised how to fill it and to bring it at next appt  - given foot care handout and explained the principles  - given instructions for hypoglycemia management "15-15 rule"  - advised for yearly eye  exams - had the flu vaccine through work - Return to clinic in 3 mo with sugar log

## 2014-09-22 ENCOUNTER — Encounter: Payer: 59 | Admitting: Cardiology

## 2014-09-30 ENCOUNTER — Ambulatory Visit (INDEPENDENT_AMBULATORY_CARE_PROVIDER_SITE_OTHER): Payer: 59 | Admitting: Internal Medicine

## 2014-09-30 ENCOUNTER — Encounter: Payer: Self-pay | Admitting: Internal Medicine

## 2014-09-30 VITALS — BP 112/68 | HR 68 | Ht 70.0 in | Wt 228.0 lb

## 2014-09-30 DIAGNOSIS — K8020 Calculus of gallbladder without cholecystitis without obstruction: Secondary | ICD-10-CM

## 2014-09-30 DIAGNOSIS — K7030 Alcoholic cirrhosis of liver without ascites: Secondary | ICD-10-CM

## 2014-09-30 NOTE — Patient Instructions (Addendum)
Follow up as needed  CC: Dr [name]

## 2014-09-30 NOTE — Progress Notes (Signed)
Ralph BuckerRobert A Walker [birth year] [id number]  Note: This dictation was prepared with Dragon digital system. Any transcriptional errors that result from this procedure are unintentional.   History of Present Illness: This is a 53 year old white male withLaernnec's cirrhosis [facility]. 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 He was hospitalized in August of this year with the pneumonia and pancreatitis. His lipase was elevated. He has known cholelithiasis. MRCP showed normal extrahepatic and intrahepatic ducts, dilated portal vein and nodule liver consistent with fibrosis HIDA scan in July 2015 showed patent cystic duct. His liver function tests on 09/18/2014 were normal. He reports having at least 2 prior attacks of biliary colic 1 year ago and recently. He's been completely off alcohol for past 2 years    Past Medical History  Diagnosis Date  . Heart murmur     ECHO 07/2011 showed mild AS and LVH.  TEE 09/2011 showed small PFO, bicuspid aortic valve but no stenosis or regurg.  [name]. 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  Gen surg and GI recommended flagyl and cipro x 2 wks..  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  . Cholelithiasis     u/s--no cholecystitis.  06/2014 u/s showed no stones, only GB sludge  . Peripheral neuropathy     Diabetic:  Autonomic (pelvic) and LE polyneuropathy--[facility] testing showed that it is likely from DM  . Diabetic foot ulcers     Poor healing; normal ABIs and TBIs  . Arthritis     back L3 - L5  . Hyperactive gag reflex   . History of pyelonephritis   . Type II or unspecified type diabetes mellitus with neurological manifestations, not stated as uncontrolled     IDDM: neuro, renal,  and ophth complications  . Wears dentures     upper  . Osteomyelitis of toe of left foot     left 4th and 5th toes--now s/p amputation of these areas  . Diabetic retinopathy associated with type 2 diabetes mellitus     Laser tx in both eyes (Dr. [name])  . Diabetic nephropathy     Proteinuria and CrCl 55-65  . Chronic renal insufficiency, stage III (moderate)   . Lumbar spondylosis     MRI 08/25/11 with multilevel facet dz, small disc protrusion at L5-S1 to the right without impingement  . Ischemic cardiomyopathy 07/22/14    Myoview w/out ischemia but large scar (inferior/apex) w/ global hypokinesis--medical mgmt  . History of pancreatitis 2014 and 2015    Suspected biliary: passing small gallstones: surgery declined to remove GB 07/2014.  . Aspiration pneumonia   . Thrombocytopenia   . Portal hypertension   . Liver cirrhosis secondary to NASH     ?+ alcohol?.  With hx of splenomegaly and ascities (dx approx 2012)  . History of gram negative sepsis 2015    e coli    Past Surgical History  Procedure Laterality Date  . Knee arthroscopy  2001    Bilateral  . Amputation  10/24/2012    Procedure: AMPUTATION RAY;  Surgeon: [name] A Bednarz, MD;  Location: [facility];  Service: Orthopedics;  Laterality: Left;  left 4th toe amputation through MTP joint, 5th Ray amputation   . Knee surgery  2001  . Hida scan  06/2014    Normal  . Transthoracic echocardiogram  07/19/14    Mild LV dilation and hypertrophy, EF 35-40%, no wall motion abnormalities, grade II diast dysfxn, bicuspid aortic valve with mild AS and mild AI.  [name] Cardiovascular stress test  07/2014    Myoview w/out ischemia but large scar (inferior/apex) w/ global hypokinesis--medical mgmt  . Eye surgery  08/2014    Retinal hemorrhage evacuation    Allergies  Allergen Reactions  . Hydralazine Hcl Hives  . Naproxen Nausea And Vomiting  . Other Itching and Other (See Comments)    PERFUMES - SNEEZING    Family history and  social history have been reviewed.  Review of Systems: Denies abdominal pain indigestion heartburn diarrhea  The remainder of the 10 point ROS is negative except as outlined in the H&P  Physical Exam: General Appearance Well developed, in no distress Eyes  Non icteric  HEENT  Non traumatic, normocephalic  Mouth No lesion, tongue papillated, no cheilosis Neck Supple without adenopathy, thyroid not enlarged, no carotid bruits, no JVD Lungs Clear to auscultation bilaterally COR Normal S1, normal S2, regular rhythm, no murmur, quiet precordium Abdomen protuberant soft. Nontender. Normoactive bowel sounds. Liver edge at costal margin. No ascites. Splenic tip is not palpable Rectal not done Extremities  No pedal edema Skin No lesions, palmar erythema Neurological Alert and oriented x 3, no asterixis Psychological Normal mood and affect  Assessment and Plan:   52 year old all white male with established alcohol related cirrhosis. Recently evaluated at [facility] by a gastroenterologist, no need for another evaluation.   Cholelithiasis. History of 2 episodes of biliary colic, resulting in elevated lipase. Negative HIDA scan several months ago. We have discussed possible laparoscopic cholecystectomy providing his platelet count will be over 100,000. He is feeling well now and is not interested in gallbladder surgery  History of alcohol abuse. Currently abstinent for past 2 years.  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 Not a problem. Any more.. Fistula was healed on follow up sigmoidoscopy. He is up-to-date on all colonoscopy . I will see him on when necessary basis    [name] 09/30/2014

## 2014-10-01 ENCOUNTER — Encounter: Payer: Self-pay | Admitting: Internal Medicine

## 2014-10-02 ENCOUNTER — Emergency Department (HOSPITAL_COMMUNITY): Payer: 59

## 2014-10-02 ENCOUNTER — Inpatient Hospital Stay (HOSPITAL_COMMUNITY): Payer: 59

## 2014-10-02 ENCOUNTER — Encounter (HOSPITAL_COMMUNITY)
Admission: EM | Disposition: A | Discharge status: Home Health | Payer: Self-pay | Source: Home / Self Care | Attending: Pulmonary Disease

## 2014-10-02 ENCOUNTER — Encounter (HOSPITAL_COMMUNITY): Payer: Self-pay | Admitting: Emergency Medicine

## 2014-10-02 ENCOUNTER — Inpatient Hospital Stay (HOSPITAL_COMMUNITY)
Admission: EM | Admit: 2014-10-02 | Discharge: 2014-10-18 | DRG: 246 | Disposition: A | Discharge status: Home Health | Payer: 59 | Attending: Internal Medicine | Admitting: Internal Medicine

## 2014-10-02 ENCOUNTER — Ambulatory Visit (HOSPITAL_COMMUNITY): Admit: 2014-10-02 | Payer: Self-pay | Admitting: Cardiovascular Disease

## 2014-10-02 DIAGNOSIS — Z888 Allergy status to other drugs, medicaments and biological substances status: Secondary | ICD-10-CM | POA: Diagnosis not present

## 2014-10-02 DIAGNOSIS — K766 Portal hypertension: Secondary | ICD-10-CM | POA: Diagnosis present

## 2014-10-02 DIAGNOSIS — D6959 Other secondary thrombocytopenia: Secondary | ICD-10-CM | POA: Diagnosis present

## 2014-10-02 DIAGNOSIS — I2510 Atherosclerotic heart disease of native coronary artery without angina pectoris: Secondary | ICD-10-CM | POA: Diagnosis present

## 2014-10-02 DIAGNOSIS — Q211 Atrial septal defect: Secondary | ICD-10-CM

## 2014-10-02 DIAGNOSIS — Z0189 Encounter for other specified special examinations: Secondary | ICD-10-CM

## 2014-10-02 DIAGNOSIS — E11319 Type 2 diabetes mellitus with unspecified diabetic retinopathy without macular edema: Secondary | ICD-10-CM | POA: Diagnosis present

## 2014-10-02 DIAGNOSIS — I5022 Chronic systolic (congestive) heart failure: Secondary | ICD-10-CM

## 2014-10-02 DIAGNOSIS — J9601 Acute respiratory failure with hypoxia: Secondary | ICD-10-CM | POA: Diagnosis not present

## 2014-10-02 DIAGNOSIS — I4901 Ventricular fibrillation: Secondary | ICD-10-CM

## 2014-10-02 DIAGNOSIS — E1121 Type 2 diabetes mellitus with diabetic nephropathy: Secondary | ICD-10-CM | POA: Diagnosis present

## 2014-10-02 DIAGNOSIS — Z8249 Family history of ischemic heart disease and other diseases of the circulatory system: Secondary | ICD-10-CM | POA: Diagnosis not present

## 2014-10-02 DIAGNOSIS — E861 Hypovolemia: Secondary | ICD-10-CM | POA: Diagnosis present

## 2014-10-02 DIAGNOSIS — Z886 Allergy status to analgesic agent status: Secondary | ICD-10-CM | POA: Diagnosis not present

## 2014-10-02 DIAGNOSIS — E1165 Type 2 diabetes mellitus with hyperglycemia: Secondary | ICD-10-CM | POA: Diagnosis present

## 2014-10-02 DIAGNOSIS — R161 Splenomegaly, not elsewhere classified: Secondary | ICD-10-CM | POA: Diagnosis present

## 2014-10-02 DIAGNOSIS — J9383 Other pneumothorax: Secondary | ICD-10-CM

## 2014-10-02 DIAGNOSIS — D696 Thrombocytopenia, unspecified: Secondary | ICD-10-CM

## 2014-10-02 DIAGNOSIS — N179 Acute kidney failure, unspecified: Secondary | ICD-10-CM

## 2014-10-02 DIAGNOSIS — D638 Anemia in other chronic diseases classified elsewhere: Secondary | ICD-10-CM | POA: Diagnosis present

## 2014-10-02 DIAGNOSIS — K7581 Nonalcoholic steatohepatitis (NASH): Secondary | ICD-10-CM

## 2014-10-02 DIAGNOSIS — R040 Epistaxis: Secondary | ICD-10-CM | POA: Diagnosis not present

## 2014-10-02 DIAGNOSIS — J9600 Acute respiratory failure, unspecified whether with hypoxia or hypercapnia: Secondary | ICD-10-CM

## 2014-10-02 DIAGNOSIS — Z6834 Body mass index (BMI) 34.0-34.9, adult: Secondary | ICD-10-CM | POA: Diagnosis not present

## 2014-10-02 DIAGNOSIS — Z833 Family history of diabetes mellitus: Secondary | ICD-10-CM

## 2014-10-02 DIAGNOSIS — Q231 Congenital insufficiency of aortic valve: Secondary | ICD-10-CM

## 2014-10-02 DIAGNOSIS — E872 Acidosis: Secondary | ICD-10-CM | POA: Diagnosis present

## 2014-10-02 DIAGNOSIS — I469 Cardiac arrest, cause unspecified: Secondary | ICD-10-CM | POA: Diagnosis present

## 2014-10-02 DIAGNOSIS — I214 Non-ST elevation (NSTEMI) myocardial infarction: Secondary | ICD-10-CM

## 2014-10-02 DIAGNOSIS — E1143 Type 2 diabetes mellitus with diabetic autonomic (poly)neuropathy: Secondary | ICD-10-CM | POA: Diagnosis present

## 2014-10-02 DIAGNOSIS — I255 Ischemic cardiomyopathy: Secondary | ICD-10-CM | POA: Diagnosis present

## 2014-10-02 DIAGNOSIS — F1010 Alcohol abuse, uncomplicated: Secondary | ICD-10-CM | POA: Diagnosis present

## 2014-10-02 DIAGNOSIS — E669 Obesity, unspecified: Secondary | ICD-10-CM | POA: Diagnosis present

## 2014-10-02 DIAGNOSIS — F17220 Nicotine dependence, chewing tobacco, uncomplicated: Secondary | ICD-10-CM | POA: Diagnosis present

## 2014-10-02 DIAGNOSIS — J069 Acute upper respiratory infection, unspecified: Secondary | ICD-10-CM

## 2014-10-02 DIAGNOSIS — K567 Ileus, unspecified: Secondary | ICD-10-CM

## 2014-10-02 DIAGNOSIS — D6489 Other specified anemias: Secondary | ICD-10-CM

## 2014-10-02 DIAGNOSIS — G9340 Encephalopathy, unspecified: Secondary | ICD-10-CM | POA: Diagnosis not present

## 2014-10-02 DIAGNOSIS — N182 Chronic kidney disease, stage 2 (mild): Secondary | ICD-10-CM

## 2014-10-02 DIAGNOSIS — K7030 Alcoholic cirrhosis of liver without ascites: Secondary | ICD-10-CM | POA: Diagnosis present

## 2014-10-02 DIAGNOSIS — R778 Other specified abnormalities of plasma proteins: Secondary | ICD-10-CM

## 2014-10-02 DIAGNOSIS — M4690 Unspecified inflammatory spondylopathy, site unspecified: Secondary | ICD-10-CM | POA: Diagnosis present

## 2014-10-02 DIAGNOSIS — I509 Heart failure, unspecified: Secondary | ICD-10-CM

## 2014-10-02 DIAGNOSIS — E118 Type 2 diabetes mellitus with unspecified complications: Secondary | ICD-10-CM

## 2014-10-02 DIAGNOSIS — Z794 Long term (current) use of insulin: Secondary | ICD-10-CM | POA: Diagnosis not present

## 2014-10-02 DIAGNOSIS — R188 Other ascites: Secondary | ICD-10-CM

## 2014-10-02 DIAGNOSIS — K7460 Unspecified cirrhosis of liver: Secondary | ICD-10-CM

## 2014-10-02 DIAGNOSIS — J810 Acute pulmonary edema: Secondary | ICD-10-CM

## 2014-10-02 DIAGNOSIS — E875 Hyperkalemia: Secondary | ICD-10-CM | POA: Diagnosis present

## 2014-10-02 DIAGNOSIS — I129 Hypertensive chronic kidney disease with stage 1 through stage 4 chronic kidney disease, or unspecified chronic kidney disease: Secondary | ICD-10-CM | POA: Diagnosis present

## 2014-10-02 DIAGNOSIS — I5023 Acute on chronic systolic (congestive) heart failure: Secondary | ICD-10-CM

## 2014-10-02 DIAGNOSIS — Z955 Presence of coronary angioplasty implant and graft: Secondary | ICD-10-CM

## 2014-10-02 DIAGNOSIS — I2583 Coronary atherosclerosis due to lipid rich plaque: Secondary | ICD-10-CM

## 2014-10-02 DIAGNOSIS — J9690 Respiratory failure, unspecified, unspecified whether with hypoxia or hypercapnia: Secondary | ICD-10-CM

## 2014-10-02 DIAGNOSIS — K8010 Calculus of gallbladder with chronic cholecystitis without obstruction: Secondary | ICD-10-CM

## 2014-10-02 DIAGNOSIS — I429 Cardiomyopathy, unspecified: Secondary | ICD-10-CM

## 2014-10-02 DIAGNOSIS — N183 Chronic kidney disease, stage 3 (moderate): Secondary | ICD-10-CM | POA: Diagnosis present

## 2014-10-02 DIAGNOSIS — K7469 Other cirrhosis of liver: Secondary | ICD-10-CM

## 2014-10-02 DIAGNOSIS — R7989 Other specified abnormal findings of blood chemistry: Secondary | ICD-10-CM

## 2014-10-02 DIAGNOSIS — IMO0002 Reserved for concepts with insufficient information to code with codable children: Secondary | ICD-10-CM

## 2014-10-02 DIAGNOSIS — I10 Essential (primary) hypertension: Secondary | ICD-10-CM

## 2014-10-02 DIAGNOSIS — R570 Cardiogenic shock: Secondary | ICD-10-CM

## 2014-10-02 HISTORY — DX: Ventricular fibrillation: I49.01

## 2014-10-02 LAB — BASIC METABOLIC PANEL
ANION GAP: 12 (ref 5–15)
BUN: 7 mg/dL (ref 6–23)
BUN: 42 mg/dL — ABNORMAL HIGH (ref 6–23)
CHLORIDE: 92 meq/L — AB (ref 96–112)
CO2: <7 mEq/L — CL (ref 19–32)
CO2: 18 mEq/L — ABNORMAL LOW (ref 19–32)
Calcium: <4 mg/dL — CL (ref 8.4–10.5)
Calcium: 7.7 mg/dL — ABNORMAL LOW (ref 8.4–10.5)
Chloride: 103 mEq/L (ref 96–112)
Creatinine, Ser: 0.24 mg/dL — ABNORMAL LOW (ref 0.50–1.35)
Creatinine, Ser: 1.3 mg/dL (ref 0.50–1.35)
GFR calc Af Amer: 71 mL/min — ABNORMAL LOW (ref >90)
GFR, EST NON AFRICAN AMERICAN: 62 mL/min — AB (ref >90)
Glucose, Bld: 30 mg/dL — CL (ref 70–99)
Glucose, Bld: 354 mg/dL — ABNORMAL HIGH (ref 70–99)
POTASSIUM: 3.4 meq/L — AB (ref 3.7–5.3)
POTASSIUM: 6.8 meq/L — AB (ref 3.7–5.3)
SODIUM: 118 meq/L — AB (ref 137–147)
SODIUM: 133 meq/L — AB (ref 137–147)

## 2014-10-02 LAB — CBC WITH DIFFERENTIAL/PLATELET
BASOS PCT: 0 % (ref 0–1)
Basophils Absolute: 0 10*3/uL (ref 0.0–0.1)
EOS PCT: 1 % (ref 0–5)
Eosinophils Absolute: 0.1 10*3/uL (ref 0.0–0.7)
HEMATOCRIT: 30 % — AB (ref 39.0–52.0)
Hemoglobin: 10.3 g/dL — ABNORMAL LOW (ref 13.0–17.0)
Lymphocytes Relative: 4 % — ABNORMAL LOW (ref 12–46)
Lymphs Abs: 0.5 10*3/uL — ABNORMAL LOW (ref 0.7–4.0)
MCH: 30.6 pg (ref 26.0–34.0)
MCHC: 34.3 g/dL (ref 30.0–36.0)
MCV: 89 fL (ref 78.0–100.0)
MONO ABS: 1 10*3/uL (ref 0.1–1.0)
Monocytes Relative: 8 % (ref 3–12)
Neutro Abs: 10.7 10*3/uL — ABNORMAL HIGH (ref 1.7–7.7)
Neutrophils Relative %: 87 % — ABNORMAL HIGH (ref 43–77)
Platelets: 36 10*3/uL — ABNORMAL LOW (ref 150–400)
RBC: 3.37 MIL/uL — ABNORMAL LOW (ref 4.22–5.81)
RDW: 15 % (ref 11.5–15.5)
WBC: 12.2 10*3/uL — AB (ref 4.0–10.5)

## 2014-10-02 LAB — POCT I-STAT 3, ART BLOOD GAS (G3+)
Acid-base deficit: 7 mmol/L — ABNORMAL HIGH (ref 0.0–2.0)
BICARBONATE: 21.4 meq/L (ref 20.0–24.0)
O2 SAT: 100 %
PCO2 ART: 48.3 mmHg — AB (ref 35.0–45.0)
Patient temperature: 93.2
TCO2: 23 mmol/L (ref 0–100)
pH, Arterial: 7.238 — ABNORMAL LOW (ref 7.350–7.450)
pO2, Arterial: 207 mmHg — ABNORMAL HIGH (ref 80.0–100.0)

## 2014-10-02 LAB — BLOOD GAS, ARTERIAL
Acid-base deficit: 8.5 mmol/L — ABNORMAL HIGH (ref 0.0–2.0)
Bicarbonate: 17.3 mEq/L — ABNORMAL LOW (ref 20.0–24.0)
DRAWN BY: 36496
FIO2: 0.8 %
LHR: 18 {breaths}/min
O2 Saturation: 100 %
PATIENT TEMPERATURE: 98.6
PCO2 ART: 40 mmHg (ref 35.0–45.0)
PEEP/CPAP: 5 cmH2O
PH ART: 7.259 — AB (ref 7.350–7.450)
TCO2: 18.5 mmol/L (ref 0–100)
VT: 500 mL
pO2, Arterial: 282 mmHg — ABNORMAL HIGH (ref 80.0–100.0)

## 2014-10-02 LAB — I-STAT TROPONIN, ED: TROPONIN I, POC: 0.26 ng/mL — AB (ref 0.00–0.08)

## 2014-10-02 LAB — POCT I-STAT, CHEM 8
BUN: 39 mg/dL — ABNORMAL HIGH (ref 6–23)
CALCIUM ION: 1.13 mmol/L (ref 1.12–1.23)
Chloride: 108 mEq/L (ref 96–112)
Creatinine, Ser: 1.3 mg/dL (ref 0.50–1.35)
Glucose, Bld: 185 mg/dL — ABNORMAL HIGH (ref 70–99)
HCT: 31 % — ABNORMAL LOW (ref 39.0–52.0)
Hemoglobin: 10.5 g/dL — ABNORMAL LOW (ref 13.0–17.0)
Potassium: 6.8 mEq/L (ref 3.7–5.3)
Sodium: 137 mEq/L (ref 137–147)
TCO2: 19 mmol/L (ref 0–100)

## 2014-10-02 LAB — CBG MONITORING, ED: GLUCOSE-CAPILLARY: 137 mg/dL — AB (ref 70–99)

## 2014-10-02 LAB — GLUCOSE, CAPILLARY
GLUCOSE-CAPILLARY: 214 mg/dL — AB (ref 70–99)
Glucose-Capillary: 141 mg/dL — ABNORMAL HIGH (ref 70–99)
Glucose-Capillary: 265 mg/dL — ABNORMAL HIGH (ref 70–99)

## 2014-10-02 LAB — COMPREHENSIVE METABOLIC PANEL
ALK PHOS: 117 U/L (ref 39–117)
ALT: 35 U/L (ref 0–53)
AST: 44 U/L — ABNORMAL HIGH (ref 0–37)
Albumin: 3.1 g/dL — ABNORMAL LOW (ref 3.5–5.2)
Anion gap: 10 (ref 5–15)
BILIRUBIN TOTAL: 0.8 mg/dL (ref 0.3–1.2)
BUN: 41 mg/dL — AB (ref 6–23)
CO2: 19 meq/L (ref 19–32)
Calcium: 7.5 mg/dL — ABNORMAL LOW (ref 8.4–10.5)
Chloride: 106 mEq/L (ref 96–112)
Creatinine, Ser: 1.38 mg/dL — ABNORMAL HIGH (ref 0.50–1.35)
GFR, EST AFRICAN AMERICAN: 66 mL/min — AB (ref >90)
GFR, EST NON AFRICAN AMERICAN: 57 mL/min — AB (ref >90)
Glucose, Bld: 183 mg/dL — ABNORMAL HIGH (ref 70–99)
POTASSIUM: 6.9 meq/L — AB (ref 3.7–5.3)
Sodium: 135 mEq/L — ABNORMAL LOW (ref 137–147)
TOTAL PROTEIN: 5.8 g/dL — AB (ref 6.0–8.3)

## 2014-10-02 LAB — MAGNESIUM: Magnesium: 2.2 mg/dL (ref 1.5–2.5)

## 2014-10-02 LAB — APTT: APTT: 32 s (ref 24–37)

## 2014-10-02 LAB — PROTIME-INR
INR: 1.3 (ref 0.00–1.49)
Prothrombin Time: 16.4 seconds — ABNORMAL HIGH (ref 11.6–15.2)

## 2014-10-02 LAB — MRSA PCR SCREENING: MRSA by PCR: NEGATIVE

## 2014-10-02 SURGERY — LEFT HEART CATHETERIZATION WITH CORONARY ANGIOGRAM
Anesthesia: Choice | Laterality: Bilateral

## 2014-10-02 MED ORDER — DEXTROSE 50 % IV SOLN
50.0000 mL | Freq: Once | INTRAVENOUS | Status: AC
  Administered 2014-10-02: 50 mL via INTRAVENOUS
  Filled 2014-10-02: qty 50

## 2014-10-02 MED ORDER — SODIUM CHLORIDE 0.9 % IV BOLUS (SEPSIS)
500.0000 mL | Freq: Once | INTRAVENOUS | Status: AC
  Administered 2014-10-02: 500 mL via INTRAVENOUS

## 2014-10-02 MED ORDER — SODIUM BICARBONATE 8.4 % IV SOLN
50.0000 meq | Freq: Once | INTRAVENOUS | Status: AC
  Administered 2014-10-02: 50 meq via INTRAVENOUS
  Filled 2014-10-02: qty 50

## 2014-10-02 MED ORDER — PRISMASOL BGK 0/2.5 32-2.5 MEQ/L IV SOLN
INTRAVENOUS | Status: DC
  Administered 2014-10-03: via INTRAVENOUS_CENTRAL
  Filled 2014-10-02: qty 5000

## 2014-10-02 MED ORDER — ROCURONIUM BROMIDE 50 MG/5ML IV SOLN
INTRAVENOUS | Status: AC | PRN
  Administered 2014-10-02: 100 mg via INTRAVENOUS

## 2014-10-02 MED ORDER — AMIODARONE LOAD VIA INFUSION
150.0000 mg | Freq: Once | INTRAVENOUS | Status: AC
  Administered 2014-10-02: 150 mg via INTRAVENOUS
  Filled 2014-10-02: qty 83.34

## 2014-10-02 MED ORDER — PRISMASOL BGK 4/2.5 32-4-2.5 MEQ/L IV SOLN: INTRAVENOUS | Status: DC

## 2014-10-02 MED ORDER — INSULIN ASPART 100 UNIT/ML ~~LOC~~ SOLN: 2.0000 [IU] | SUBCUTANEOUS | Status: DC

## 2014-10-02 MED ORDER — SODIUM CHLORIDE 0.9 % IV SOLN
INTRAVENOUS | Status: DC
  Administered 2014-10-03: 3.1 [IU]/h via INTRAVENOUS
  Filled 2014-10-02: qty 2.5

## 2014-10-02 MED ORDER — MIDAZOLAM BOLUS VIA INFUSION
2.0000 mg | INTRAVENOUS | Status: DC | PRN
  Filled 2014-10-02: qty 2

## 2014-10-02 MED ORDER — ALBUTEROL SULFATE (2.5 MG/3ML) 0.083% IN NEBU: 2.5000 mg | INHALATION_SOLUTION | RESPIRATORY_TRACT | Status: DC | PRN

## 2014-10-02 MED ORDER — SODIUM CHLORIDE 0.9 % IV SOLN
1.0000 ug/kg/min | INTRAVENOUS | Status: DC
  Administered 2014-10-02: 1 ug/kg/min via INTRAVENOUS
  Filled 2014-10-02 (×2): qty 20

## 2014-10-02 MED ORDER — NOREPINEPHRINE BITARTRATE 1 MG/ML IV SOLN
0.0000 ug/min | INTRAVENOUS | Status: DC
  Administered 2014-10-02: 30 ug/min via INTRAVENOUS
  Administered 2014-10-03: 35 ug/min via INTRAVENOUS
  Administered 2014-10-03: 47 ug/min via INTRAVENOUS
  Administered 2014-10-03: 50 ug/min via INTRAVENOUS
  Administered 2014-10-04: 47.5 ug/min via INTRAVENOUS
  Administered 2014-10-04: 50 ug/min via INTRAVENOUS
  Administered 2014-10-04: 34 ug/min via INTRAVENOUS
  Administered 2014-10-04: 50 ug/min via INTRAVENOUS
  Administered 2014-10-05: 16 ug/min via INTRAVENOUS
  Filled 2014-10-02 (×9): qty 16

## 2014-10-02 MED ORDER — LACTULOSE 10 GM/15ML PO SOLN
10.0000 g | Freq: Once | ORAL | Status: AC
  Administered 2014-10-02: 10 g
  Filled 2014-10-02: qty 15

## 2014-10-02 MED ORDER — CISATRACURIUM BOLUS VIA INFUSION
0.0500 mg/kg | INTRAVENOUS | Status: DC | PRN
  Filled 2014-10-02: qty 6

## 2014-10-02 MED ORDER — FENTANYL BOLUS VIA INFUSION
50.0000 ug | INTRAVENOUS | Status: DC | PRN
  Filled 2014-10-02: qty 50

## 2014-10-02 MED ORDER — MIDAZOLAM HCL 2 MG/2ML IJ SOLN
2.0000 mg | Freq: Once | INTRAMUSCULAR | Status: AC | PRN
Start: 2014-10-02 — End: 2014-10-02

## 2014-10-02 MED ORDER — HEPARIN SODIUM (PORCINE) 5000 UNIT/ML IJ SOLN
5000.0000 [IU] | Freq: Three times a day (TID) | INTRAMUSCULAR | Status: DC
Start: 2014-10-02 — End: 2014-10-02

## 2014-10-02 MED ORDER — INSULIN ASPART 100 UNIT/ML ~~LOC~~ SOLN
10.0000 [IU] | Freq: Once | SUBCUTANEOUS | Status: AC
  Administered 2014-10-02: 10 [IU] via INTRAVENOUS

## 2014-10-02 MED ORDER — ETOMIDATE 2 MG/ML IV SOLN
INTRAVENOUS | Status: AC | PRN
  Administered 2014-10-02: 20 mg via INTRAVENOUS

## 2014-10-02 MED ORDER — HEPARIN SODIUM (PORCINE) 1000 UNIT/ML DIALYSIS
1000.0000 [IU] | INTRAMUSCULAR | Status: DC | PRN
  Filled 2014-10-02: qty 6

## 2014-10-02 MED ORDER — SODIUM BICARBONATE 8.4 % IV SOLN
INTRAVENOUS | Status: AC
  Filled 2014-10-02: qty 50

## 2014-10-02 MED ORDER — SODIUM CHLORIDE 0.9 % IV SOLN
2000.0000 mL | Freq: Once | INTRAVENOUS | Status: AC
  Administered 2014-10-02: 2000 mL via INTRAVENOUS

## 2014-10-02 MED ORDER — AMIODARONE HCL IN DEXTROSE 360-4.14 MG/200ML-% IV SOLN
INTRAVENOUS | Status: AC
  Administered 2014-10-02: 60 mg/h via INTRAVENOUS
  Filled 2014-10-02: qty 200

## 2014-10-02 MED ORDER — SODIUM CHLORIDE 0.9 % IV SOLN
1.0000 mg/h | INTRAVENOUS | Status: DC
  Administered 2014-10-03: 2 mg/h via INTRAVENOUS
  Filled 2014-10-02 (×2): qty 10

## 2014-10-02 MED ORDER — MIDAZOLAM HCL 5 MG/ML IJ SOLN: 2.0000 mg | Freq: Once | INTRAMUSCULAR | Status: DC

## 2014-10-02 MED ORDER — PANTOPRAZOLE SODIUM 40 MG IV SOLR
40.0000 mg | INTRAVENOUS | Status: DC
  Administered 2014-10-02 – 2014-10-05 (×4): 40 mg via INTRAVENOUS
  Filled 2014-10-02 (×5): qty 40

## 2014-10-02 MED ORDER — SODIUM CHLORIDE 0.9 % IV SOLN
1.0000 mg/h | INTRAVENOUS | Status: DC
  Administered 2014-10-02: 2 mg/h via INTRAVENOUS
  Filled 2014-10-02: qty 10

## 2014-10-02 MED ORDER — AMIODARONE HCL IN DEXTROSE 360-4.14 MG/200ML-% IV SOLN
60.0000 mg/h | INTRAVENOUS | Status: AC
  Administered 2014-10-02 (×2): 60 mg/h via INTRAVENOUS
  Filled 2014-10-02: qty 200

## 2014-10-02 MED ORDER — FENTANYL CITRATE 0.05 MG/ML IJ SOLN: 100.0000 ug | Freq: Once | INTRAMUSCULAR | Status: DC

## 2014-10-02 MED ORDER — CISATRACURIUM BOLUS VIA INFUSION
0.1000 mg/kg | Freq: Once | INTRAVENOUS | Status: AC
  Administered 2014-10-02: 10.3 mg via INTRAVENOUS
  Filled 2014-10-02: qty 11

## 2014-10-02 MED ORDER — CISATRACURIUM BOLUS VIA INFUSION
0.1000 mg/kg | Freq: Once | INTRAVENOUS | Status: DC
Start: 2014-10-02 — End: 2014-10-02
  Filled 2014-10-02: qty 11

## 2014-10-02 MED ORDER — FENTANYL CITRATE 0.05 MG/ML IJ SOLN: 100.0000 ug | Freq: Once | INTRAMUSCULAR | Status: AC | PRN

## 2014-10-02 MED ORDER — SODIUM CHLORIDE 0.9 % FOR CRRT
INTRAVENOUS_CENTRAL | Status: DC | PRN
  Filled 2014-10-02: qty 1000

## 2014-10-02 MED ORDER — SODIUM CHLORIDE 0.9 % IV SOLN: 2000.0000 mL | Freq: Once | INTRAVENOUS | Status: AC

## 2014-10-02 MED ORDER — CISATRACURIUM BESYLATE 10 MG/ML IV SOLN
1.0000 ug/kg/min | INTRAVENOUS | Status: DC
Start: 2014-10-02 — End: 2014-10-02
  Filled 2014-10-02: qty 20

## 2014-10-02 MED ORDER — NOREPINEPHRINE BITARTRATE 1 MG/ML IV SOLN
0.0000 ug/min | INTRAVENOUS | Status: DC
  Administered 2014-10-02: 10 ug/min via INTRAVENOUS
  Administered 2014-10-02: 12 ug/min via INTRAVENOUS
  Filled 2014-10-02 (×3): qty 4

## 2014-10-02 MED ORDER — SODIUM CHLORIDE 0.9 % IV SOLN
1.0000 g | Freq: Once | INTRAVENOUS | Status: AC
  Administered 2014-10-02: 1 g via INTRAVENOUS
  Filled 2014-10-02: qty 10

## 2014-10-02 MED ORDER — SODIUM CHLORIDE 0.9 % IV SOLN
25.0000 ug/h | INTRAVENOUS | Status: DC
  Administered 2014-10-02 – 2014-10-03 (×2): 100 ug/h via INTRAVENOUS
  Filled 2014-10-02 (×2): qty 50

## 2014-10-02 MED ORDER — AMIODARONE HCL IN DEXTROSE 360-4.14 MG/200ML-% IV SOLN
30.0000 mg/h | INTRAVENOUS | Status: DC
  Administered 2014-10-03: 30 mg/h via INTRAVENOUS
  Filled 2014-10-02 (×3): qty 200

## 2014-10-02 MED ORDER — ASPIRIN 300 MG RE SUPP
300.0000 mg | RECTAL | Status: AC
  Administered 2014-10-03: 300 mg via RECTAL
  Filled 2014-10-02: qty 1

## 2014-10-02 MED ORDER — NOREPINEPHRINE BITARTRATE 1 MG/ML IV SOLN
0.5000 ug/min | INTRAVENOUS | Status: DC
  Filled 2014-10-02: qty 4

## 2014-10-02 MED ORDER — INSULIN ASPART 100 UNIT/ML ~~LOC~~ SOLN
0.0000 [IU] | SUBCUTANEOUS | Status: DC
  Administered 2014-10-02: 11 [IU] via SUBCUTANEOUS

## 2014-10-02 MED ORDER — DEXTROSE 50 % IV SOLN
INTRAVENOUS | Status: AC
  Filled 2014-10-02: qty 50

## 2014-10-02 MED ORDER — SODIUM CHLORIDE 0.9 % IV SOLN
25.0000 ug/h | INTRAVENOUS | Status: DC
  Filled 2014-10-02: qty 50

## 2014-10-02 MED ORDER — ARTIFICIAL TEARS OP OINT
1.0000 "application " | TOPICAL_OINTMENT | Freq: Three times a day (TID) | OPHTHALMIC | Status: DC
  Administered 2014-10-02 – 2014-10-04 (×5): 1 via OPHTHALMIC
  Filled 2014-10-02: qty 3.5

## 2014-10-02 MED ORDER — PRISMASOL BGK 0/2.5 32-2.5 MEQ/L IV SOLN
INTRAVENOUS | Status: DC
  Administered 2014-10-03 (×3): via INTRAVENOUS_CENTRAL
  Filled 2014-10-02 (×8): qty 5000

## 2014-10-02 NOTE — Progress Notes (Signed)
eLink Physician-Brief Progress Note Patient Name: [name] DOB: 11/04/1961 MRN: [id number]   Date of Service  10/02/2014  HPI/Events of Note  Hyperkalemia -6.9  eICU Interventions  Lactulose, 500cc NS bolus Consider shifting if still high on next check     Intervention Category Major Interventions: Electrolyte abnormality - evaluation and management  [name] 10/02/2014, 7:41 PM

## 2014-10-02 NOTE — Consult Note (Addendum)
Patient seen and personally examined.  I have reviewed old chart, old echo and nuclear stress test and spoken with patient's sister and coworker regarding events over the past 24 hours.  The patient has a history of DCM presumed nonischemic with nuclear stress test in 07/2014 showing no ischemia and EF 19%.  He was placed on medical therapy and had been doing well.  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  He also has a history of gallstone pancreatitis but was deemed not to be a surgical candidate for cholecystectomy recently.  He was also not felt to be a surgical liver transplant candidate both due to multiple comorbidites.  He has a history of Type II DM with diabetic nephropathy, neuropathy and retinopathy with recent retinal hemorrhage a month ago.  A 2D echo 2/12 showed mild AS with small PFO by TEE and bicuspid AV with mild AS and normal LVF.  Repeat echo 8/15 when hospitalized for gram negative sepsis with E coli with presumed biliary source, showed significant reduction in EF at 35-40% with diastolic dysfunction and mild AS.  Nuclear stress test showed no evidence of ischemia and a fixed defect in the inferior wall and apex.  He was treated medically.    He has done well since then with no complaints of chest pain or SOB per his sister.  Today at work a Radio broadcast assistantcoworker stated that the patient said that he felt the best he has felt in a while.  He was filling out a paper and collapsed in front of witnesses, hitting his head on a piece of furniture.  CPR was started immediately and an AED was placed and shocked for presumed Vfib.  On EMS arrival he was in sinus tachycardia.  He was awake but confused and there was concern whether he could protect his airway and he was sedated and intubated and cooling process begun.  On exam he is intubated and sedated.  His lungs are clear anteriorly and heart is RRR with no M/R/G.  Ext  with no C/E/E with 2+ pulses bilaterally.  EKG shows NSR with anteroseptal infarct and nonspecific IVCD - compared to prior EKG - the IVCD is new as well as T wave inversions in the lateral precordial leads.  His platelet count is pending but was 31K on 10/1.  He is hyponatremic with a sodium of 118 and hypokalemic and Ca is low at <4.  WBC is 2.1 and Hbg is 2.1 (?lab error).  He has been deemed not to be a surgical candidate recently for cholecystectomy of liver transplant due to comorbid conditions.  The event today sound primary arrhythmogenic from his DCM.  He has no chest pain prior to the event.  He dose have new T wave changes on EKG which could be post shock and also from new IVCD.  He has some J point elevation in V1-V4 but does not meet criteria for STEMI and this was reviewed with Dr. Tresa EndoKelly.  His platelet count is pending today but was 36K 2 weeks ago and he had a recent retinal hemorrhage several weeks ago and therefore prevents giving anticoagulation.  He is not a candidate for cath at this time due to severe thrombocytopenia.  His BP is stable.  His labs also indicate severe anemia although this is probably a lab error.  Will repeat CBC with plt count and CMET along with ionized CA and mag given metabolic derangements  on initial labs.    Recommend admit to CCU on arctic sun protocol.  No anticoagulation due to thrombocytopenia.  Repeat 2D echo to rule out pericardial effusion given last echo with effusion present.  Hold on beta blocker for now to see how BP and HR respond to cooling.  Will add Amio IV gtt for suppression of arrhythmias.  Cycle cardiac enzymes.  EP consult in the am.  Given suppressed EF and cardiac arrest he is candidate for AICD but need to determine patient's life expectancy with his liver disease.  I spent a total of 60 minutes in reviewing old notes, echo and nuclear stress test, lab data, interviewing and examining patient and discussing with family.  >50% of this was spent in  direct patient care.  Armanda Magic, MD [facility]

## 2014-10-02 NOTE — ED Notes (Signed)
NOTIFIED DR. Sharol Given FOR PATIENTS PANIC LAB RESULTS OF I-STST TROPONIN = 0.26 ng/ml ,@17 :26 pm ,10/02/2014.

## 2014-10-02 NOTE — Progress Notes (Signed)
CRITICAL VALUE ALERT  Critical value received:  Istat K 6.8  Date of notification:   10/15  Time of notification:  1730  Critical value read back:Yes.    Nurse who received alert:  Hazel Sams RN   MD notified (1st page):  Dr. Garald Braver   Time of first page:  1735  MD notified (2nd page):  Time of second page:  Responding MD:  Dr.Mongul  Time MD responded:  1800

## 2014-10-02 NOTE — Consult Note (Signed)
Referring Provider: No ref. provider found Primary Care Physician:  [name], MD Primary Nephrologist:    Reason for Consultation:  Acute renal failure and multiorgan failure and metabolic acidosis and hyperkalemia HPI: 53yo male with histrory ischemic cardiomyopathy EF 35-40%, hepatic cirrhosis,known  DM, and cholelithiasis presented with a cardiac arrest at work underwent CPR/defib. Pt suddenly collapsed, fell backward and hit his head. AED advised shockable rhythm. Approx 10 minutes CPR with ROSC before EMS arrival. PCCM called to admit. Baseline creatinine is normal. BP 60 systolic and potassium 6.9. Oliguric renal failure. Noted thrombocytopenia.   Past Medical History  Diagnosis Date  . Heart murmur     ECHO 07/2011 showed mild AS and LVH.  TEE 09/2011 showed small PFO, bicuspid aortic valve but no stenosis or regurg.  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  Gen surg and GI recommended flagyl and cipro x 2 wks..  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  . Cholelithiasis     u/s--no cholecystitis.  06/2014 u/s showed no stones, only GB sludge  . Peripheral neuropathy     Diabetic:  Autonomic (pelvic) and LE polyneuropathy--[facility] testing showed that it is likely from DM  . Diabetic foot ulcers     Poor healing; normal ABIs and TBIs  . Arthritis     back L3 - L5  . Hyperactive gag reflex   . History of pyelonephritis   . Type II or unspecified type diabetes mellitus with neurological manifestations, not stated as uncontrolled     IDDM: neuro, renal, and ophth complications  . Wears dentures     upper  . Osteomyelitis of toe of left foot     left 4th and 5th toes--now s/p amputation of these areas  . Diabetic retinopathy associated with type 2 diabetes mellitus     Laser tx in both eyes (Dr. Allyne Gee)  . Diabetic  nephropathy     Proteinuria and CrCl 55-65  . Chronic renal insufficiency, stage III (moderate)   . Lumbar spondylosis     MRI 08/25/11 with multilevel facet dz, small disc protrusion at L5-S1 to the right without impingement  . Ischemic cardiomyopathy 07/22/14    Myoview w/out ischemia but large scar (inferior/apex) w/ global hypokinesis--medical mgmt  . History of pancreatitis 2014 and 2015    Suspected biliary: passing small gallstones: surgery declined to remove GB 07/2014.  . Aspiration pneumonia   . Thrombocytopenia   . Portal hypertension   . Liver cirrhosis secondary to NASH     ?+ alcohol?.  With hx of splenomegaly and ascities (dx approx 2012)  . History of gram negative sepsis 2015    e coli    Past Surgical History  Procedure Laterality Date  . Knee arthroscopy  2001    Bilateral  . Amputation  10/24/2012    Procedure: AMPUTATION RAY;  Surgeon: Sherri Rad, MD;  Location: [facility];  Service: Orthopedics;  Laterality: Left;  left 4th toe amputation through MTP joint, 5th Ray amputation   . Knee surgery  2001  . Hida scan  06/2014    Normal  . Transthoracic echocardiogram  07/19/14    Mild LV dilation and hypertrophy, EF 35-40%, no wall motion abnormalities, grade II diast dysfxn, bicuspid aortic valve with mild AS and mild AI.  [name] Cardiovascular stress test  07/2014  Myoview w/out ischemia but large scar (inferior/apex) w/ global hypokinesis--medical mgmt  . Eye surgery  08/2014    Retinal hemorrhage evacuation    Prior to Admission medications   Medication Sig Start Date End Date Taking? Authorizing Provider  carvedilol (COREG) 3.125 MG tablet Take 1 tablet (3.125 mg total) by mouth 2 (two) times daily with a meal. 08/08/14  Yes [name], MD  furosemide (LASIX) 40 MG tablet Take 40 mg by mouth 2 (two) times daily.   Yes Historical Provider, MD  glucagon 1 MG injection Inject 1 mg into the vein once as needed. For hypoglycemia 02/25/14  Yes [name], MD  insulin aspart (NOVOLOG) 100 UNIT/ML injection Inject 10 Units into the skin 3 (three) times daily before meals. 09/10/14  Yes [name], MD  insulin glargine (LANTUS) 100 UNIT/ML injection Inject 0.38 mLs (38 Units total) into the skin at bedtime. 09/16/14  Yes [name], MD  lisinopril (PRINIVIL,ZESTRIL) 10 MG tablet Take 1 tablet (10 mg total) by mouth daily. 07/24/14  Yes Clydia Llano, MD  pantoprazole (PROTONIX) 40 MG tablet Take 1 tablet (40 mg total) by mouth daily. 07/04/14  Yes [name], MD  promethazine (PHENERGAN) 25 MG tablet Take 1 tablet (25 mg total) by mouth every 6 (six) hours as needed for nausea or vomiting. 07/18/14  Yes [name], MD  ranitidine (ZANTAC) 150 MG tablet Take 1 tablet (150 mg total) by mouth at bedtime. 07/04/14  Yes [name], MD  spironolactone (ALDACTONE) 100 MG tablet Take 1 tablet (100 mg total) by mouth daily. 08/30/14  Yes [name], MD    Current Facility-Administered Medications  Medication Dose Route Frequency Provider Last Rate Last Dose  . albuterol (PROVENTIL) (2.5 MG/3ML) 0.083% nebulizer solution 2.5 mg  2.5 mg Nebulization Q3H PRN Jeanella Craze, NP      . amiodarone (NEXTERONE PREMIX) 360 MG/200ML (1.8 mg/mL) IV infusion  60 mg/hr Intravenous Continuous Quintella Reichert, MD 33.3 mL/hr at 10/02/14 1936 60 mg/hr at 10/02/14 1936   Followed by  . [START ON 10/03/2014] amiodarone (NEXTERONE PREMIX) 360 MG/200ML (1.8 mg/mL) IV infusion  30 mg/hr Intravenous Continuous Quintella Reichert, MD      . amiodarone (NEXTERONE) 1.8 mg/mL load via infusion 150 mg  150 mg Intravenous Once Quintella Reichert, MD      . artificial tears (LACRILUBE) ophthalmic ointment 1 application  1 application Both Eyes 3 times per day Jeanella Craze, NP      . aspirin suppository 300 mg  300 mg Rectal NOW Jeanella Craze, NP      . calcium gluconate 1 g in sodium chloride 0.9 % 100 mL IVPB  1 g Intravenous Once Rahul P Desai, PA-C      .  cisatracurium (NIMBEX) 200 mg in sodium chloride 0.9 % 200 mL (1 mg/mL) infusion  1-1.5 mcg/kg/min Intravenous Continuous Jeanella Craze, NP 6.2 mL/hr at 10/02/14 1936 1 mcg/kg/min at 10/02/14 1936   And  . cisatracurium (NIMBEX) bolus via infusion 5.2 mg  0.05 mg/kg Intravenous PRN Jeanella Craze, NP      . fentaNYL (SUBLIMAZE) 2,500 mcg in sodium chloride 0.9 % 250 mL (10 mcg/mL) infusion  25-300 mcg/hr Intravenous Continuous Jeanella Craze, NP 10 mL/hr at 10/02/14 2055 100 mcg/hr at 10/02/14 2055  . fentaNYL (SUBLIMAZE) bolus via infusion 50 mcg  50 mcg Intravenous Q15 min PRN Jeanella Craze, NP      .  fentaNYL (SUBLIMAZE) injection 100 mcg  100 mcg Intravenous Once Jeanella Craze, NP      . fentaNYL (SUBLIMAZE) injection 100 mcg  100 mcg Intravenous Once PRN Jeanella Craze, NP      . insulin aspart (novoLOG) injection 0-20 Units  0-20 Units Subcutaneous 6 times per day Jeanella Craze, NP   11 Units at 10/02/14 2013  . lactulose (CHRONULAC) 10 GM/15ML solution 10 g  10 g Per Tube Once Vishal Mungal, MD      . midazolam (VERSED) 50 mg in sodium chloride 0.9 % 50 mL (1 mg/mL) infusion  1-10 mg/hr Intravenous Continuous Jeanella Craze, NP 2 mL/hr at 10/02/14 1936 2 mg/hr at 10/02/14 1936  . midazolam (VERSED) bolus via infusion 2 mg  2 mg Intravenous Q30 min PRN Jeanella Craze, NP      . midazolam (VERSED) injection 2 mg  2 mg Intravenous Once PRN Jeanella Craze, NP      . norepinephrine (LEVOPHED) 4 mg in dextrose 5 % 250 mL (0.016 mg/mL) infusion  0-40 mcg/min Intravenous Titrated Alyson Reedy, MD 45 mL/hr at 10/02/14 2055 12 mcg/min at 10/02/14 2055  . pantoprazole (PROTONIX) injection 40 mg  40 mg Intravenous Q24H Jeanella Craze, NP        Allergies as of 10/02/2014 - Review Complete 10/02/2014  Allergen Reaction Noted  . Hydralazine hcl Hives 03/04/2013  . Naproxen Nausea And Vomiting 07/14/2011  . Other Itching and Other (See Comments) 10/23/2012    Family History  Problem Relation  Age of Onset  . Heart disease Father   . Diabetes type II Sister     History   Social History  . Marital Status: Single    Spouse Name: N/A    Number of Children: N/A  . Years of Education: N/A   Occupational History  . Not on file.   Social History Main Topics  . Smoking status: Never Smoker   . Smokeless tobacco: Current User    Types: Chew  . Alcohol Use: No     Comment: Quit drinking alcohol 08/20/2011  . Drug Use: No  . Sexual Activity: Not on file   Other Topics Concern  . Not on file   Social History Narrative   Single, no children.  Lives in [place], relocated from [place], [place] in 2008.   Chews tobacco but does not smoke.   Drinks 12 Beers daily---patient did not initially report/admit this--as of 2013 he has quit this.   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    Review of Systems: Unobtainable intubated and sedated  Physical Exam: Vital signs in last 24 hours: Temp:  [93.2 F (34 C)-96.1 F (35.6 C)] 94.5 F (34.7 C) (10/15 1900) Pulse Rate:  [54-102] 54 (10/15 1945) Resp:  [0-18] 18 (10/15 1945) BP: (60-168)/(24-79) 139/66 mmHg (10/15 1945) SpO2:  [93 %-100 %] 100 % (10/15 1945) Arterial Line BP: (75-136)/(41-73) 136/73 mmHg (10/15 1900) FiO2 (%):  [70 %-100 %] 70 % (10/15 1945) Weight:  [103.4 kg (227 lb 15.3 oz)-110.4 kg (243 lb 6.2 oz)] 110.4 kg (243 lb 6.2 oz) (10/15 1945)   General:  Obese ill appearing man Head:  Normocephalic and atraumatic. Eyes:  Sclera clear, no icterus.   Conjunctiva pink. Ears:  Normal auditory acuity. Nose:  No deformity, discharge,  or lesions. Mouth:  No deformity or lesions, dentition normal. Neck:  Supple; no masses or thyromegaly. JVP not elevated  Lungs:  Diffuse rhonchi intubated Heart:  Regular rate and rhythm; no murmurs, clicks, rubs,  or gallops. Abdomen:  Soft, nontender and nondistended. No masses, hepatosplenomegaly or hernias noted. Normal bowel sounds, without  guarding, and without rebound.   Msk:  Symmetrical without gross deformities. Normal posture. Pulses:  No carotid, renal, femoral bruits. DP and PT symmetrical and equal Extremities:  Without clubbing or edema. Skin:  Intact without significant lesions or rashes.   Intake/Output from previous day:   Intake/Output this shift: Total I/O In: 500 [IV Piggyback:500] Out: -   Lab Results:  Recent Labs  10/02/14 1530 10/02/14 1733 10/02/14 1734  WBC 1.7*  --  12.2*  HGB 2.1* 10.5* 10.3*  HCT 9.5* 31.0* 30.0*  PLT PLATELET CLUMPS NOTED ON SMEAR, UNABLE TO ESTIMATE  --  36*   BMET  Recent Labs  10/02/14 1530 10/02/14 1733 10/02/14 1734  NA 118* 137 135*  K 3.4* 6.8* 6.9*  CL 92* 108 106  CO2 <7*  --  19  GLUCOSE 30* 185* 183*  BUN 7 39* 41*  CREATININE 0.24* 1.30 1.38*  CALCIUM <4.0*  --  7.5*   LFT  Recent Labs  10/02/14 1734  PROT 5.8*  ALBUMIN 3.1*  AST 44*  ALT 35  ALKPHOS 117  BILITOT 0.8   PT/INR  Recent Labs  10/02/14 1700  LABPROT 16.4*  INR 1.30   Hepatitis Panel No results found for this basename: HEPBSAG, HCVAB, HEPAIGM, HEPBIGM,  in the last 72 hours  Studies/Results: Ct Head Wo Contrast  10/02/2014   CLINICAL DATA:  53 year old male with history of trauma after after a fall during a syncopal episode during which the patient fell backward and struck his head. Status post CPR.  EXAM: CT HEAD WITHOUT CONTRAST  TECHNIQUE: Contiguous axial images were obtained from the base of the skull through the vertex without intravenous contrast.  COMPARISON:  No priors.  FINDINGS: No acute displaced skull fractures are identified. No acute intracranial abnormality. Specifically, no evidence of acute post-traumatic intracranial hemorrhage, no definite regions of acute/subacute cerebral ischemia, no focal mass, mass effect, hydrocephalus or abnormal intra or extra-axial fluid collections. The visualized paranasal sinuses and mastoids are well pneumatized, with  exception of a small air-fluid level in the posterior aspect of the left maxillary sinus and right sphenoid sinus.  IMPRESSION: 1. Small amount of soft tissue swelling in the occipital scalp, without underlying displaced skull fracture or acute intracranial abnormalities. 2. Small air-fluid level in the left maxillary sinus and right sphenoid sinus. This is nonspecific, but correlation for signs of sinusitis is recommended.   Electronically Signed   By: Trudie Reedaniel  Entrikin M.D.   On: 10/02/2014 16:36   Dg Chest Portable 1 View  10/02/2014   CLINICAL DATA:  Readjustment of endotracheal tube.  EXAM: PORTABLE CHEST - 1 VIEW  COMPARISON:  Same day.  FINDINGS: Stable cardiomediastinal silhouette. Endotracheal tube is now seen with distal tip 3 cm above the carina in grossly good position. Right internal jugular catheter remains with distal tip overlying expected position of the SVC. Stable bilateral lung opacities are noted. No pneumothorax or pleural effusion is noted.  IMPRESSION: Stable bilateral lung opacities. Endotracheal tube in grossly good position.   Electronically Signed   By: Roque LiasJames  Green M.D.   On: 10/02/2014 17:04   Dg Chest Portable 1 View  10/02/2014   CLINICAL DATA:  Status post intubation and central line placement.  EXAM: PORTABLE CHEST - 1 VIEW  COMPARISON:  July 18, 2014.  FINDINGS: Right internal jugular catheter line is noted with distal tip in expected position of the SVC. Endotracheal tube is directed in the right mainstem bronchus. It is recommended at be withdrawn approximately 4 cm. Diffuse airspace opacities are noted in the right lung in left upper lobe, as well as large left retrocardiac opacity concerning for bilateral pneumonia. No pneumothorax or significant pleural effusion is noted. Bony thorax is intact.  IMPRESSION: Significantly increased bilateral lung opacities are noted concerning for pneumonia or possibly edema. Endotracheal tube is directed in the right mainstem bronchus;  it is recommended to be withdrawn at least 4 cm. Critical Value/emergent results were called by telephone at the time of interpretation on 10/02/2014 at 4:15 pm to Dr. Zadie Rhine , who verbally acknowledged these results.   Electronically Signed   By: Roque Lias M.D.   On: 10/02/2014 16:15    Assessment/Plan: 53 year old diabetic and cirrhotic had a witnessed cardiac arrest at work and was successfully resuscitated. He has acute oliguric renal failure from cardiogenic shock and was noted to be hyperkalemic with a rising potassium. Critical care requested a stat consult for the initiation of CRRT.  1. Shock  More hemodynamically stable at this time.   2. Acute renal failure will start CRRT therapy  3. Hyperkalemia  Will follow and agree with temporizing measures  4. Metabolic acidosis  5. Cirrhosis with thrombocytopenia no heparin required  6. Diabetes controlled by CCM  LOS: 0 [employer] W @TODAY @9 :12 PM

## 2014-10-02 NOTE — ED Notes (Signed)
Critical Care at the bedside  

## 2014-10-02 NOTE — Procedures (Signed)
Arterial Catheter Insertion Procedure Note [name] [id number] 1961-03-27  Procedure: Insertion of Arterial Catheter  Indications: Blood pressure monitoring  Procedure Details Consent: Unable to obtain consent because of emergent medical necessity. Time Out: Verified patient identification, verified procedure, site/side was marked, verified correct patient position, special equipment/implants available, medications/allergies/relevent history reviewed, required imaging and test results available.  Performed  Maximum sterile technique was used including antiseptics, cap, gloves, gown, hand hygiene, mask and sheet. Skin prep: Chlorhexidine; local anesthetic administered 20 gauge catheter was inserted into right radial artery using the Seldinger technique.  Evaluation Blood flow good; BP tracing good. Complications: No apparent complications.   [name] 10/02/2014

## 2014-10-02 NOTE — ED Notes (Signed)
Going to CT 3 with patient. Family is in consultation room. No movement noted from patient. Tolerating all interventions thus far. Pt skin is clammy.

## 2014-10-02 NOTE — H&P (Signed)
PULMONARY / CRITICAL CARE MEDICINE   Name: [name] MRN: [id number] DOB: 07/17/1961    ADMISSION DATE:  10/02/2014  REFERRING MD :  EDP  CHIEF COMPLAINT:  Post arrest   INITIAL PRESENTATION: 53yo male with hx ischemic cardiomyopathy EF 35-40%, cirrhosis, DM, cholelithiasis presented 10/15 after arrest at work with EMS trained bystander CPR/defib. Pt suddenly collapsed, fell backward and hit his head.  AED advised shockable rhythm. Approx 10 minutes CPR with ROSC before EMS arrival.  PCCM called to admit.   STUDIES:  CT head 10/15>>>   SIGNIFICANT EVENTS:    HISTORY OF PRESENT ILLNESS:  53yo male with hx ischemic cardiomyopathy EF 35-40%, cirrhosis, DM, cholelithiasis presented 10/15 after arrest at work with EMS trained bystander CPR/defib.  Approx 10 minutes CPR with ROSC before EMS arrival.  Some flailing motions on arrival to ER but nothing purposeful.     PAST MEDICAL HISTORY :  Past Medical History  Diagnosis Date  . Heart murmur     ECHO 07/2011 showed mild AS and LVH.  TEE 09/2011 showed small PFO, bicuspid aortic valve but no stenosis or regurg.  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  Gen surg and GI recommended flagyl and cipro x 2 wks..  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  . Cholelithiasis     u/s--no cholecystitis.  06/2014 u/s showed no stones, only GB sludge  . Peripheral neuropathy     Diabetic:  Autonomic (pelvic) and LE polyneuropathy--[facility] testing showed that it is likely from DM  . Diabetic foot ulcers     Poor healing; normal ABIs and TBIs  . Arthritis     back L3 - L5  . Hyperactive gag reflex   . History of pyelonephritis   . Type II or unspecified type diabetes mellitus with neurological manifestations, not stated as uncontrolled     IDDM: neuro, renal, and ophth complications   . Wears dentures     upper  . Osteomyelitis of toe of left foot     left 4th and 5th toes--now s/p amputation of these areas  . Diabetic retinopathy associated with type 2 diabetes mellitus     Laser tx in both eyes (Dr. Allyne Gee)  . Diabetic nephropathy     Proteinuria and CrCl 55-65  . Chronic renal insufficiency, stage III (moderate)   . Lumbar spondylosis     MRI 08/25/11 with multilevel facet dz, small disc protrusion at L5-S1 to the right without impingement  . Ischemic cardiomyopathy 07/22/14    Myoview w/out ischemia but large scar (inferior/apex) w/ global hypokinesis--medical mgmt  . History of pancreatitis 2014 and 2015    Suspected biliary: passing small gallstones: surgery declined to remove GB 07/2014.  . Aspiration pneumonia   . Thrombocytopenia   . Portal hypertension   . Liver cirrhosis secondary to NASH     ?+ alcohol?.  With hx of splenomegaly and ascities (dx approx 2012)  . History of gram negative sepsis 2015    e coli     has past surgical history that includes Knee arthroscopy (2001); Amputation (10/24/2012); Knee surgery (2001); hida scan (06/2014); transthoracic echocardiogram (07/19/14); Cardiovascular stress test (07/2014); and Eye surgery (08/2014).  Prior to Admission medications   Medication Sig Start Date End Date Taking? Authorizing Provider  carvedilol (COREG) 3.125 MG tablet Take 1 tablet (3.125  mg total) by mouth 2 (two) times daily with a meal. 08/08/14   [name], MD  furosemide (LASIX) 40 MG tablet TAKE 1 TABLET TWICE A DAY 08/29/14   [name], MD  glucagon 1 MG injection Inject 1 mg into the vein once as needed. For hypoglycemia 02/25/14   [name], MD  insulin aspart (NOVOLOG) 100 UNIT/ML injection Inject 10 Units into the skin 3 (three) times daily before meals. 09/10/14   [name], MD  insulin glargine (LANTUS) 100 UNIT/ML injection Inject 0.38 mLs (38 Units total) into the skin at bedtime. 09/16/14   [name], MD   lisinopril (PRINIVIL,ZESTRIL) 10 MG tablet Take 1 tablet (10 mg total) by mouth daily. 07/24/14   Clydia Llano, MD  pantoprazole (PROTONIX) 40 MG tablet Take 1 tablet (40 mg total) by mouth daily. 07/04/14   [name], MD  promethazine (PHENERGAN) 25 MG tablet Take 1 tablet (25 mg total) by mouth every 6 (six) hours as needed for nausea or vomiting. 07/18/14   [name], MD  ranitidine (ZANTAC) 150 MG tablet Take 1 tablet (150 mg total) by mouth at bedtime. 07/04/14   [name], MD  spironolactone (ALDACTONE) 100 MG tablet Take 1 tablet (100 mg total) by mouth daily. 08/30/14   [name], MD   Allergies  Allergen Reactions  . Hydralazine Hcl Hives  . Naproxen Nausea And Vomiting  . Other Itching and Other (See Comments)    PERFUMES - SNEEZING    FAMILY HISTORY:  indicated that his mother is deceased. He indicated that his father is deceased. He indicated that all of his three sisters are alive.  SOCIAL HISTORY:  reports that he has never smoked. His smokeless tobacco use includes Chew. He reports that he does not drink alcohol or use illicit drugs.  REVIEW OF SYSTEMS:  Unable.   SUBJECTIVE:   VITAL SIGNS: Pulse Rate:  [82-102] 82 (10/15 1534) Resp:  [18] 18 (10/15 1534) BP: (71-125)/(41-79) 125/79 mmHg (10/15 1534) SpO2:  [93 %-97 %] 97 % (10/15 1534) FiO2 (%):  [100 %] 100 % (10/15 1534) Weight:  [227 lb 15.3 oz (103.4 kg)] 227 lb 15.3 oz (103.4 kg) (10/15 1534) HEMODYNAMICS:   VENTILATOR SETTINGS: Vent Mode:  [-]  FiO2 (%):  [100 %] 100 % INTAKE / OUTPUT: No intake or output data in the 24 hours ending 10/02/14 1604  PHYSICAL EXAMINATION: General:  Chronically ill appearing male, acutely ill  Neuro:  Obtunded on vent HEENT:  Mm dry, no JVD Cardiovascular:  s1s2 tachy, distant Lungs:  resps even non labored on vent, coarse diffusely, bloody secretions  Abdomen:  Obese, round, soft, hypoactive bs Musculoskeletal:  Pale, cool and dry, scant BLE  edema   LABS:  CBC No results found for this basename: WBC, HGB, HCT, PLT,  in the last 168 hours Coag's No results found for this basename: APTT, INR,  in the last 168 hours BMET No results found for this basename: NA, K, CL, CO2, BUN, CREATININE, GLUCOSE,  in the last 168 hours Electrolytes No results found for this basename: CALCIUM, MG, PHOS,  in the last 168 hours Sepsis Markers No results found for this basename: LATICACIDVEN, PROCALCITON, O2SATVEN,  in the last 168 hours ABG No results found for this basename: PHART, PCO2ART, PO2ART,  in the last 168 hours Liver Enzymes No results found for this basename: AST, ALT, ALKPHOS, BILITOT, ALBUMIN,  in the last 168 hours Cardiac  Enzymes No results found for this basename: TROPONINI, PROBNP,  in the last 168 hours Glucose  Recent Labs Lab 10/02/14 1521  GLUCAP 137*    Imaging No results found.   ASSESSMENT / PLAN:  PULMONARY OETT 10/15>>> Acute respiratory failure, post arrest P:   Vent support F/u CXR F/u ABG  Daily SBT  PRN BD   CARDIOVASCULAR CVL R IJ CVL 10/15>>> Cardiac arrest - shockable rhythm per AED, ~10 mins CPR Hx ischemic cardiomyopathy - EF 35-40% per echo 07/2014 Hx HTN Hypotension - improved  P:  Cardiology to see  Hypothermia protocol  ASA Trend troponin  F/u 12 lead EKG  BNP  Trend CVP  Gentle volume  Defer decision for anticoagulation to cards - await CT head prior   RENAL No known issue - all labs pending  P:   Chem pending   GASTROINTESTINAL Hx cholelithiasis  Hx cirrhosis - ?NASH v ETOH  P:   Trend LFT's  PPI  NPO for now   HEMATOLOGIC Hx thrombocytopenia  P:  F/u cbc  SQ heparin  Monitor plt   INFECTIOUS No active issue  P:   Monitor wbc, fever curve off abx   ENDOCRINE Hx DM    P:   SSI   NEUROLOGIC AMS - post cardiac arrest  Fall  P:   Stat CT head  Sedation per hypothermia protocol    Family updated: 10/15 at bedside in ER  Dirk DressKaty Whiteheart,  NP 10/02/2014  4:04 PM Pager: [phone] or [phone]  I have personally obtained a history, examined the patient, evaluated laboratory and imaging results, formulated the assessment and plan and placed orders.  CRITICAL CARE: The patient is critically ill with multiple organ systems failure and requires high complexity decision making for assessment and support, frequent evaluation and titration of therapies, application of advanced monitoring technologies and extensive interpretation of multiple databases. Critical Care Time devoted to patient care services described in this note is 45 minutes.   *Care during the described time interval was provided by me and/or other providers on the critical care team. I have reviewed this patient's available data, including medical history, events of note, physical examination and test results as part of my evaluation.  Alyson ReedyWesam G. Lelaina Oatis, M.D. [facility]. Pager: [phone]. After hours pager: [phone].

## 2014-10-02 NOTE — Progress Notes (Signed)
PCCM Interval Progress Note  Notified by RN of K of 7.   On review of MAR, no meds given in ED for hyperkalemia.  STAT ABG. 1g Ca Gluconate. 10u insulin. 1 amp D50. 1 amp Bicarb Follow up BMP (currently on q2hrs BMP's per hypothermia protocol)   Rutherford Guys, PA - [facility] [place] [facility] Pgr: [phone]  or [phone] 10/02/2014, 8:01 PM

## 2014-10-02 NOTE — ED Provider Notes (Signed)
I have personally seen and examined the patient and discussed plan of care with the resident.  I was present for entire procedure.  I have reviewed the appropriate documentation on PMH/FH/Soc. History.  I have reviewed the documentation of the resident and agree.   CRITICAL CARE Performed by: Joya Gaskins Total critical care time: 33 Critical care time was exclusive of separately billable procedures and treating other patients. Critical care was necessary to treat or prevent imminent or life-threatening deterioration. Critical care was time spent personally by me on the following activities: development of treatment plan with patient and/or surrogate as well as nursing, discussions with consultants, evaluation of patient's response to treatment, examination of patient, obtaining history from patient or surrogate, ordering and performing treatments and interventions, ordering and review of laboratory studies, ordering and review of radiographic studies, pulse oximetry and re-evaluation of patient's condition.   Patient ill appearing on arrival Intubated on arrival Care then managed by critical care (ET tube was noted to be in right mainstem, but was retracted by PCCM) Pt admitted to ICU  Joya Gaskins, MD 10/02/14 1649

## 2014-10-02 NOTE — Consult Note (Addendum)
CARDIOLOGY CONSULT NOTE   Patient ID: [name] MRN: [id number], DOB/AGE: 1961-07-31   Admit date: 10/02/2014 Date of Consult: 10/02/2014   Primary Physician: [name], MD Primary Cardiologist: Dr. Aundra Dubin  Pt. Profile   53 yo male with PMH of cirrhosis, bicuspid aortic valve, obesity, DM, h/o pancreatitis 2/2 ETOH and h/o presumed ischemic cardiomyopathy based on stress test finding presented with cardiac arrest which achieved ROSC after AED shock x 2  Problem List  Past Medical History  Diagnosis Date  . Heart murmur     ECHO 07/2011 showed mild AS and LVH.  TEE 09/2011 showed small PFO, bicuspid aortic valve but no stenosis or regurg.  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  Gen surg and GI recommended flagyl and cipro x 2 wks..  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  . Cholelithiasis     u/s--no cholecystitis.  06/2014 u/s showed no stones, only GB sludge  . Peripheral neuropathy     Diabetic:  Autonomic (pelvic) and LE polyneuropathy--[facility] testing showed that it is likely from DM  . Diabetic foot ulcers     Poor healing; normal ABIs and TBIs  . Arthritis     back L3 - L5  . Hyperactive gag reflex   . History of pyelonephritis   . Type II or unspecified type diabetes mellitus with neurological manifestations, not stated as uncontrolled     IDDM: neuro, renal, and ophth complications  . Wears dentures     upper  . Osteomyelitis of toe of left foot     left 4th and 5th toes--now s/p amputation of these areas  . Diabetic retinopathy associated with type 2 diabetes mellitus     Laser tx in both eyes (Dr. Baird Cancer)  . Diabetic nephropathy     Proteinuria and CrCl 55-65  . Chronic renal insufficiency, stage III (moderate)   . Lumbar spondylosis     MRI 08/25/11 with multilevel facet dz, small disc  protrusion at L5-S1 to the right without impingement  . Ischemic cardiomyopathy 07/22/14    Myoview w/out ischemia but large scar (inferior/apex) w/ global hypokinesis--medical mgmt  . History of pancreatitis 2014 and 2015    Suspected biliary: passing small gallstones: surgery declined to remove GB 07/2014.  . Aspiration pneumonia   . Thrombocytopenia   . Portal hypertension   . Liver cirrhosis secondary to NASH     ?+ alcohol?.  With hx of splenomegaly and ascities (dx approx 2012)  . History of gram negative sepsis 2015    e coli    Past Surgical History  Procedure Laterality Date  . Knee arthroscopy  2001    Bilateral  . Amputation  10/24/2012    Procedure: AMPUTATION RAY;  Surgeon: Colin Rhein, MD;  Location: [place];  Service: Orthopedics;  Laterality: Left;  left 4th toe amputation through MTP joint, 5th Ray amputation   . Knee surgery  2001  . Hida scan  06/2014    Normal  . Transthoracic echocardiogram  07/19/14    Mild LV dilation and hypertrophy, EF 35-40%, no wall motion abnormalities, grade II diast dysfxn, bicuspid aortic valve with mild AS and mild AI.  [name] Cardiovascular stress test  07/2014    Myoview w/out ischemia but large scar (inferior/apex) w/ global hypokinesis--medical mgmt  . Eye surgery  08/2014    Retinal hemorrhage evacuation     Allergies  Allergies  Allergen Reactions  . Hydralazine Hcl Hives  . Naproxen Nausea And Vomiting  . Other Itching and Other (See Comments)    PERFUMES - SNEEZING    HPI   The patient is a 53 yo male with PMH of cirrhosis, bicuspid aortic valve, obesity, DM, h/o pancreatitis 2/2 ETOH and h/o presumed ischemic cardiomyopathy based on stress test finding. The first time cardiology service saw the patient was during his admission in Aug for elevated troponin in the setting of nausea and vomiting and severe epigastric pain. It was felt that the trop increase was due to demand ischemia 2/2 sepsis and pancreatitis.  Echo obtained showed EF 35-40%, mild pericardial effusion. It was felt the source of his E Coli bacteremia was likely biliary. However surgery declined to proceed with cholecystectomy due to operative risk. Patient did undergo stress test on 07/23/2014 which showed no ischemia, EF 19%, fixed defect involving inferior wall and apex compatible with scar. He was placed on medical management and to follow up with Dr. Aundra Dubin.   Per family, patient had no significant SOB and CP recently. Patient presented to work on 10/02/2014. Per coworker, patient was feeling well and was writing a Thank You card when he suddenly dropped and his hit head. His coworker brought AED and AED identified a shockable rhythm. He was shocked twice before achieving ROSC. Upon arriving to [facility] ED, patient was alert however not oriented. He was intubated in the ED to protect his airway. R IJ was placed by Dr. Nelda Marseille of [facility]. Cardiology was consulted for cardiac arrest.   Of note, significant lab include Na 135, K 5.2, alk phos 113 (down from 215), lipase 89, hgb 10.3, severe thrombocytopenia of 31.0 which is down to 91.0 during previous admission. EKG showed Sinus tach with borderline LBBB, TWI in lateral leads that's new when compared to previous EKG during last admission  Inpatient Medications  . artificial tears  1 application Both Eyes 3 times per day  . aspirin  300 mg Rectal NOW  . fentaNYL  100 mcg Intravenous Once  . heparin  5,000 Units Subcutaneous 3 times per day  . insulin aspart  0-20 Units Subcutaneous 6 times per day  . pantoprazole (PROTONIX) IV  40 mg Intravenous Q24H    Family History Family History  Problem Relation Age of Onset  . Heart disease Father   . Diabetes type II Sister      Social History History   Social History  . Marital Status: Single    Spouse Name: N/A    Number of Children: N/A  . Years of Education: N/A   Occupational History  . Not on file.   Social History Main Topics    . Smoking status: Never Smoker   . Smokeless tobacco: Current User    Types: Chew  . Alcohol Use: No     Comment: Quit drinking alcohol 08/20/2011  . Drug Use: No  . Sexual Activity: Not on file   Other Topics Concern  . Not on file   Social History Narrative   Single, no children.  Lives in [place], relocated from [place], [place] in 2008.   Chews tobacco but does not smoke.   Drinks 12 Beers daily---patient did not initially report/admit this--as of 2013 he has quit this.   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     Review  of Systems Unable to obtain ROS, patient intubated when seen  Physical Exam  Blood pressure 125/79, pulse 82, resp. rate 18, weight 227 lb 15.3 oz (103.4 kg), SpO2 97.00%.  General: Intubated [place] HEENT: Normal  Neck: Supple without bruits or JVD. Lungs:  Resp regular and unlabored, anterior exam CTA. R IJ in place Heart: RRR no s3, s4, or murmurs. Abdomen: Soft, non-distended, BS + x 4.  Extremities: No clubbing, cyanosis or edema. DP/PT/Radials 2+ and equal bilaterally. IO in place  Labs  No results found for this basename: CKTOTAL, CKMB, TROPONINI,  in the last 72 hours Lab Results  Component Value Date   WBC 3.5* 09/18/2014   HGB 10.3* 09/18/2014   HCT 29.4* 09/18/2014   MCV 87.7 09/18/2014   PLT 31.0* 09/18/2014   No results found for this basename: NA, K, CL, CO2, BUN, CREATININE, CALCIUM, LABALBU, PROT, BILITOT, ALKPHOS, ALT, AST, GLUCOSE,  in the last 168 hours Lab Results  Component Value Date   CHOL 135 07/19/2014   HDL 18* 07/19/2014   LDLCALC 91 07/19/2014   TRIG 131 07/19/2014   Lab Results  Component Value Date   DDIMER 0.40 09/10/2012    Radiology/Studies  No results found.  ECG  EKG showed Sinus tach with borderline LBBB, TWI in lateral leads that's new when compared to previous EKG during last admission   ASSESSMENT AND PLAN  1. Cardiac arrest  - likely primary event in the setting of  nonischemic cardiomyopathy  - QTC 472, continue to monitor. Keep K > 4.0 and Mg > 2.0  - ROSC after shocked x 2 by AED  - normally need cath, however ?cath candidate with severe thrombocytopenia. Dr. Radford Pax to discuss with interventional cardiologist  Addendum: per Dr. Radford Pax, no cath for now. Once patient stable, can consider coronary CT and EP consult for ICD for secondary prevention (depend on his prognosis and neurological recovery)  2. H/o severe ischemic cardiomyopathy  - EF 35% by echo, 19% by stress test  - Stress test noted inferior and apex scar  3. Severe thrombocytopenia, platelet 31 on 09/18/2014 4. bicuspid aortic valve 5. H/o mild pericardial effusion on recent echo 6. h/o pancreatitis  7. H/o recent sepsis with biliary origin: refused by surgery to cholecystectomy due to perioperative risk with h/o cirrhosis 8. Cirrhosis 9. Obesity 10. DM  Signed, Almyra Deforest, PA-C 10/02/2014, 3:59 PM   Agree with note with minor changes as outlined above by Almyra Deforest PA-C.  Please see my progress note for full recommendations.  Fransico Him, MD [facility] 10/02/2014

## 2014-10-02 NOTE — Progress Notes (Signed)
Chaplain responded to page to provide support to patient that reported to ED by EMS  as post CPR.  Per EMS patient hit head after  collasping at work.  Patient is intubated.  Chaplain called patient's sister but patients co- worker had already informed sister of incident. Patient being assigned to ICU.  Sister with nurse for update.  Will follow as needed.  10/02/14 1600  Clinical Encounter Type  Visited With Patient;Family;Health care provider  Visit Type Initial;Spiritual support;Critical Care;ED;Trauma  Referral From Nurse  Spiritual Encounters  Spiritual Needs Emotional  Stress Factors  Family Stress Factors Health changes;Major life changes  Venida Jarvis, [place] ,pager [phone]

## 2014-10-02 NOTE — Progress Notes (Signed)
eLink Physician-Brief Progress Note Patient Name: [name] DOB: March 23, 1961 MRN: [id number]   Date of Service  10/02/2014  HPI/Events of Note  Small left apical pneumothorax, no elevation in peak\plateau pressures  eICU Interventions  Cont to observe, recheck cxr        [name] 10/02/2014, 10:44 PM

## 2014-10-02 NOTE — ED Provider Notes (Signed)
Patient seen/examined in the Emergency Department in conjunction with Resident Physician Provider [facility] Patient presents s/p cardiac arrest with ROSC after AED Pt agitated, hypoxic and hypotensive Plan: pt intubated on arrival.  Hypothermia protocol enacted D/w critical care dr Molli Knock as well as cardiology team to evaluate patient Will follow closely   Joya Gaskins, MD 10/02/14 1535

## 2014-10-02 NOTE — Progress Notes (Signed)
Hemodialysis Insertion Procedure Note MANA REUTER [id number] [birth year]  Procedure: Insertion of Hemodialysis Catheter Type: 3 port  Indications: Hemodialysis   Procedure Details Consent: Unable to obtain consent because of emergent medical necessity. Time Out: Verified patient identification, verified procedure, site/side was marked, verified correct patient position, special equipment/implants available, medications/allergies/relevent history reviewed, required imaging and test results available.  Performed  Maximum sterile technique was used including antiseptics, cap, gloves, gown, hand hygiene, mask and sheet. First LIJ site was cleansed and anesthetized, primary and secondary attempt at cannulation were unsuccessful.  Redirected focus to R femoral site as RIJ CVL already in place.   Skin prep: Chlorhexidine; local anesthetic administered A antimicrobial bonded/coated triple lumen catheter was placed in the right femoral vein due to multiple attempts, no other available access using the Seldinger technique. Ultrasound guidance used.Yes.   Catheter placed to 20 cm. Blood aspirated via all 3 ports and then flushed x 3. Line sutured x 2 and dressing applied.  Evaluation Blood flow good Complications: No apparent complications Patient did tolerate procedure well. Chest X-ray ordered to r/o PTX on IJ attempt.  CXR: pending.  Joneen Roach, ACNP [place] Pulmonology/Critical Care Pager [phone] or [phone]  I was present for procedure.  Coralyn Helling, MD [facility] Pulmonary/Critical Care 10/02/2014, 9:41 PM Pager:  [phone] After 3pm call: [phone]

## 2014-10-02 NOTE — ED Notes (Signed)
Pt. Arrived by [facility] to ED post CPR. Pt. Was at work, collapsed, hit head. Co workers used AED, AED fired 2 times. Down less than 10 minutes when EMS arrived. Pt. In Sinus Tach when EMS arrived. HR 90. EMS reports clinched jaw and pt. Had dip in mouth. Pt. Immobilized with back board, c collar, and head blocks. IO access, 16 g L AC, 20 g R Hand. 2L cool saline adminstered by EMS. EMS CBG 113.

## 2014-10-02 NOTE — ED Notes (Signed)
Critical care inserting central line at the bedside.

## 2014-10-02 NOTE — Progress Notes (Signed)
Rec'd pt from ED, to CT scan, to ICU on vent.  No apparent complications noted.  Pt remains on previous charted vent settings.  RN working w/ pt now.

## 2014-10-02 NOTE — Procedures (Signed)
Central Venous Catheter Insertion Procedure Note [name] [id number] Nov 02, 1961  Procedure: Insertion of Central Venous Catheter Indications: Assessment of intravascular volume, Drug and/or fluid administration and Frequent blood sampling  Procedure Details Consent: Unable to obtain consent because of emergent medical necessity. Time Out: Verified patient identification, verified procedure, site/side was marked, verified correct patient position, special equipment/implants available, medications/allergies/relevent history reviewed, required imaging and test results available.  Performed  Maximum sterile technique was used including antiseptics, cap, gloves, gown, hand hygiene, mask and sheet. Skin prep: Chlorhexidine; local anesthetic administered A antimicrobial bonded/coated triple lumen catheter was placed in the right internal jugular vein using the Seldinger technique.  Evaluation Blood flow good Complications: No apparent complications Patient did tolerate procedure well. Chest X-ray ordered to verify placement.  CXR: pending.  Performed under direct MD supervision.  Performed using ultrasound guidance.  Wire visualized in vessel under ultrasound.   Placed by Anders Simmonds ACNP  I was present and supervised the entire procedure.  U/S used in placement.  Alyson Reedy, M.D. [facility] Pulmonary/Critical Care Medicine. Pager: [phone]. After hours pager: [phone].

## 2014-10-02 NOTE — Progress Notes (Signed)
Weaned fio2 to 80% per ABG

## 2014-10-02 NOTE — Progress Notes (Signed)
eLink Physician-Brief Progress Note Patient Name: MARCELL HOCEVAR DOB: 1961-04-20 MRN: [id number]   Date of Service  10/02/2014  HPI/Events of Note  Worsening UOP, and electrolytes, worsening renal fcn  eICU Interventions  CRRT evaluation, spoke with Dr. Hyman Hopes [facility])     Intervention Category Major Interventions: Acute renal failure - evaluation and management  Tiras Bianchini 10/02/2014, 8:08 PM

## 2014-10-02 NOTE — ED Provider Notes (Signed)
CSN: [id number]     Arrival date & time 10/02/14  1514 History   First MD Initiated Contact with Patient 10/02/14 1527     No chief complaint on file.    (Consider location/radiation/quality/duration/timing/severity/associated sxs/prior Treatment) HPI Comments: Patient presents as a post CPR arrest. Patient is a 53 year old male who was at work when he suddenly collapsed and hit head. Coworkers applied AED which is a shockable rhythm and was shocked twice with return of spontaneous circulation. EMS arrived the patient was in sinus tach. He had flailing motions without purposeful movement. Ventilations assisted with bag valve mask. Estimated down time less than 10 minutes. Normal blood sugar by EMS. Had an IO as well as IV access with 2 L of saline administered by EMS. Medical history is diabetes as well as hypertensive and but cannot get any history from patient and family at this time. History limited as patient unresponsive.  Level V caveat as unresponsive  The history is provided by the EMS personnel. The history is limited by the condition of the patient.    Past Medical History  Diagnosis Date  . Heart murmur     ECHO 07/2011 showed mild AS and LVH.  TEE 09/2011 showed small PFO, bicuspid aortic valve but no stenosis or regurg.  [name]. 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  Gen surg and GI recommended flagyl and cipro x 2 wks..  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  . Cholelithiasis     u/s--no cholecystitis.  06/2014 u/s showed no stones, only GB sludge  . Peripheral neuropathy     Diabetic:  Autonomic (pelvic) and LE polyneuropathy--[facility] testing showed that it is likely from DM  . Diabetic foot ulcers     Poor healing; normal ABIs and TBIs  . Arthritis     back L3 - L5  . Hyperactive gag reflex   . History of  pyelonephritis   . Type II or unspecified type diabetes mellitus with neurological manifestations, not stated as uncontrolled     IDDM: neuro, renal, and ophth complications  . Wears dentures     upper  . Osteomyelitis of toe of left foot     left 4th and 5th toes--now s/p amputation of these areas  . Diabetic retinopathy associated with type 2 diabetes mellitus     Laser tx in both eyes (Dr. [name])  . Diabetic nephropathy     Proteinuria and CrCl 55-65  . Chronic renal insufficiency, stage III (moderate)   . Lumbar spondylosis     MRI 08/25/11 with multilevel facet dz, small disc protrusion at L5-S1 to the right without impingement  . Ischemic cardiomyopathy 07/22/14    Myoview w/out ischemia but large scar (inferior/apex) w/ global hypokinesis--medical mgmt  . History of pancreatitis 2014 and 2015    Suspected biliary: passing small gallstones: surgery declined to remove GB 07/2014.  . Aspiration pneumonia   . Thrombocytopenia   . Portal hypertension   . Liver cirrhosis secondary to NASH     ?+ alcohol?.  With hx of splenomegaly and ascities (dx approx 2012)  . History of gram negative sepsis 2015    e coli   Past Surgical History  Procedure Laterality Date  . Knee arthroscopy  2001    Bilateral  . Amputation  10/24/2012    Procedure: AMPUTATION RAY;  Surgeon:  Sherri Rad, MD;  Location: [facility];  Service: Orthopedics;  Laterality: Left;  left 4th toe amputation through MTP joint, 5th Ray amputation   . Knee surgery  2001  . Hida scan  06/2014    Normal  . Transthoracic echocardiogram  07/19/14    Mild LV dilation and hypertrophy, EF 35-40%, no wall motion abnormalities, grade II diast dysfxn, bicuspid aortic valve with mild AS and mild AI.  [name] Cardiovascular stress test  07/2014    Myoview w/out ischemia but large scar (inferior/apex) w/ global hypokinesis--medical mgmt  . Eye surgery  08/2014    Retinal hemorrhage evacuation   Family History  Problem Relation  Age of Onset  . Heart disease Father   . Diabetes type II Sister    History  Substance Use Topics  . Smoking status: Never Smoker   . Smokeless tobacco: Current User    Types: Chew  . Alcohol Use: No     Comment: Quit drinking alcohol 08/20/2011    Review of Systems  Unable to perform ROS: Patient unresponsive      Allergies  Hydralazine hcl; Naproxen; and Other  Home Medications   Prior to Admission medications   Medication Sig Start Date End Date Taking? Authorizing Provider  carvedilol (COREG) 3.125 MG tablet Take 1 tablet (3.125 mg total) by mouth 2 (two) times daily with a meal. 08/08/14   [name], MD  furosemide (LASIX) 40 MG tablet TAKE 1 TABLET TWICE A DAY 08/29/14   [name], MD  glucagon 1 MG injection Inject 1 mg into the vein once as needed. For hypoglycemia 02/25/14   [name], MD  insulin aspart (NOVOLOG) 100 UNIT/ML injection Inject 10 Units into the skin 3 (three) times daily before meals. 09/10/14   [name], MD  insulin glargine (LANTUS) 100 UNIT/ML injection Inject 0.38 mLs (38 Units total) into the skin at bedtime. 09/16/14   [name], MD  lisinopril (PRINIVIL,ZESTRIL) 10 MG tablet Take 1 tablet (10 mg total) by mouth daily. 07/24/14   Clydia Llano, MD  pantoprazole (PROTONIX) 40 MG tablet Take 1 tablet (40 mg total) by mouth daily. 07/04/14   [name], MD  promethazine (PHENERGAN) 25 MG tablet Take 1 tablet (25 mg total) by mouth every 6 (six) hours as needed for nausea or vomiting. 07/18/14   [name], MD  ranitidine (ZANTAC) 150 MG tablet Take 1 tablet (150 mg total) by mouth at bedtime. 07/04/14   [name], MD  spironolactone (ALDACTONE) 100 MG tablet Take 1 tablet (100 mg total) by mouth daily. 08/30/14   [name], MD   BP 168/79  Pulse 89  Temp(Src) 93.2 F (34 C) (Other (Comment))  Resp 18  Wt 227 lb 15.3 oz (103.4 kg)  SpO2 100% Physical Exam  Vitals reviewed. Constitutional: He  appears well-developed and well-nourished. He appears distressed.  Patient with no purposeful movement, eyes closed, no verbal response  not protecting airway on arrival  HENT:  Head: Normocephalic.  Mouth/Throat: Oropharynx is clear and moist. No oropharyngeal exudate.  Patient with bloody secretions in mouth  No large evidence of head trauma  Eyes: Conjunctivae and EOM are normal. Pupils are equal, round, and reactive to light. Right eye exhibits no discharge. Left eye exhibits no discharge. No scleral icterus.  Pupils 4 mm reactive  Neck: Normal range of motion. Neck supple.  Cardiovascular: Regular rhythm, normal heart sounds and intact distal pulses.  Exam reveals no gallop and no friction rub.   No murmur heard. Tachy, reg  Pulmonary/Chest: He is in respiratory distress. He has no wheezes. He has no rales.  Sounds bilateral with very coarse sounds  breathing spontaneously but shallow  Abdominal: Soft. He exhibits no distension and no mass. There is no tenderness.  Musculoskeletal: Normal range of motion.  Neurological: GCS eye subscore is 1. GCS verbal subscore is 1. GCS motor subscore is 6.  Moving all extremities without purpose  Skin: Skin is warm. No rash noted. He is not diaphoretic.    ED Course  INTUBATION Date/Time: 10/02/2014 3:43 PM Performed by: Pilar Jarvis Authorized by: Pilar Jarvis Consent: The procedure was performed in an emergent situation. Required items: required blood products, implants, devices, and special equipment available Patient identity confirmed: anonymous protocol, patient vented/unresponsive Indications: respiratory failure and  airway protection Intubation method: direct Patient status: paralyzed (RSI) Preoxygenation: nonrebreather mask Sedatives: etomidate Paralytic: rocuronium Laryngoscope size: Mac 3 Tube size: 7.5 mm Tube type: cuffed Number of attempts: 1 Cricoid pressure: yes Cords visualized: yes Post-procedure assessment:  chest rise Breath sounds: equal Cuff inflated: yes ETT to lip: 25 cm Tube secured with: ETT holder Comments:  Copious amounts of bloody secretions in oropharynx during  intubation   (including critical care time) Labs Review Labs Reviewed  CBG MONITORING, ED - Abnormal; Notable for the following:    Glucose-Capillary 137 (*)    All other components within normal limits  I-STAT TROPOININ, ED - Abnormal; Notable for the following:    Troponin i, poc 0.26 (*)    All other components within normal limits  CBC  APTT  PROTIME-INR  BLOOD GAS, ARTERIAL  PROTIME-INR  PROTIME-INR  APTT  APTT  BASIC METABOLIC PANEL  BASIC METABOLIC PANEL  BASIC METABOLIC PANEL  BASIC METABOLIC PANEL  BASIC METABOLIC PANEL  BASIC METABOLIC PANEL  TROPONIN I  TROPONIN I    Imaging Review Dg Chest Portable 1 View  10/02/2014   CLINICAL DATA:  Status post intubation and central line placement.  EXAM: PORTABLE CHEST - 1 VIEW  COMPARISON:  July 18, 2014.  FINDINGS: Right internal jugular catheter line is noted with distal tip in expected position of the SVC. Endotracheal tube is directed in the right mainstem bronchus. It is recommended at be withdrawn approximately 4 cm. Diffuse airspace opacities are noted in the right lung in left upper lobe, as well as large left retrocardiac opacity concerning for bilateral pneumonia. No pneumothorax or significant pleural effusion is noted. Bony thorax is intact.  IMPRESSION: Significantly increased bilateral lung opacities are noted concerning for pneumonia or possibly edema. Endotracheal tube is directed in the right mainstem bronchus; it is recommended to be withdrawn at least 4 cm. Critical Value/emergent results were called by telephone at the time of interpretation on 10/02/2014 at 4:15 pm to Dr. Zadie Rhine , who verbally acknowledged these results.   Electronically Signed   By: Roque Lias M.D.   On: 10/02/2014 16:15     EKG Interpretation   Date/Time:   Thursday October 02 2014 15:18:23 EDT Ventricular Rate:  101 PR Interval:  198 QRS Duration: 127 QT Interval:  364 QTC Calculation: 472 R Axis:   45 Text Interpretation:  Sinus tachycardia Borderline prolonged PR interval  Nonspecific intraventricular conduction delay Probable anteroseptal  infarct, recent Lateral leads are also involved Baseline wander in lead(s)  II Confirmed by Bebe Shaggy  MD, DONALD ([id number]) on 10/02/2014 3:32:24 PM  MDM   MDM: 53 year old male presents as a post arrest. Story good for heart event. Unsure of exact rhythm but shockable per AED. On arrival is in sinus tachycardia. Normal blood sugar. Not protecting airway so intubated as above. Critical Care involved upon arrival will admit patient. Cards consult on arrival.  EKG with old left bundle and new lateral ST depressions without STEMI.    Final diagnoses:  Respiratory failure  Acute respiratory failure with hypoxia  Cardiac arrest  Cardiogenic shock    Admit to Critical Care  Pilar Jarvis, MD 10/02/14 1621

## 2014-10-02 NOTE — ED Notes (Signed)
Report given to 2 H RN. RN staff made aware of critical labs hemoglobin 2.1; platelets 14 (lab is re-running).

## 2014-10-03 ENCOUNTER — Inpatient Hospital Stay (HOSPITAL_COMMUNITY): Payer: 59

## 2014-10-03 DIAGNOSIS — J9601 Acute respiratory failure with hypoxia: Secondary | ICD-10-CM

## 2014-10-03 DIAGNOSIS — R570 Cardiogenic shock: Secondary | ICD-10-CM

## 2014-10-03 LAB — POCT I-STAT, CHEM 8
BUN: 37 mg/dL — ABNORMAL HIGH (ref 6–23)
BUN: 38 mg/dL — ABNORMAL HIGH (ref 6–23)
BUN: 39 mg/dL — ABNORMAL HIGH (ref 6–23)
BUN: 29 mg/dL — AB (ref 6–23)
CALCIUM ION: 1.09 mmol/L — AB (ref 1.12–1.23)
CALCIUM ION: 1.13 mmol/L (ref 1.12–1.23)
CHLORIDE: 111 meq/L (ref 96–112)
Calcium, Ion: 1.12 mmol/L (ref 1.12–1.23)
Calcium, Ion: 1.04 mmol/L — ABNORMAL LOW (ref 1.12–1.23)
Chloride: 105 mEq/L (ref 96–112)
Chloride: 108 mEq/L (ref 96–112)
Chloride: 109 mEq/L (ref 96–112)
Creatinine, Ser: 1.2 mg/dL (ref 0.50–1.35)
Creatinine, Ser: 1.2 mg/dL (ref 0.50–1.35)
Creatinine, Ser: 1.2 mg/dL (ref 0.50–1.35)
Creatinine, Ser: 1.2 mg/dL (ref 0.50–1.35)
GLUCOSE: 149 mg/dL — AB (ref 70–99)
GLUCOSE: 356 mg/dL — AB (ref 70–99)
GLUCOSE: 357 mg/dL — AB (ref 70–99)
Glucose, Bld: 287 mg/dL — ABNORMAL HIGH (ref 70–99)
HCT: 34 % — ABNORMAL LOW (ref 39.0–52.0)
HCT: 29 % — ABNORMAL LOW (ref 39.0–52.0)
HCT: 30 % — ABNORMAL LOW (ref 39.0–52.0)
HEMATOCRIT: 30 % — AB (ref 39.0–52.0)
HEMOGLOBIN: 9.9 g/dL — AB (ref 13.0–17.0)
HEMOGLOBIN: 10.2 g/dL — AB (ref 13.0–17.0)
Hemoglobin: 10.2 g/dL — ABNORMAL LOW (ref 13.0–17.0)
Hemoglobin: 11.6 g/dL — ABNORMAL LOW (ref 13.0–17.0)
POTASSIUM: 4.5 meq/L (ref 3.7–5.3)
POTASSIUM: 6.3 meq/L — AB (ref 3.7–5.3)
POTASSIUM: 6.7 meq/L — AB (ref 3.7–5.3)
Potassium: 7 mEq/L (ref 3.7–5.3)
SODIUM: 133 meq/L — AB (ref 137–147)
SODIUM: 132 meq/L — AB (ref 137–147)
SODIUM: 133 meq/L — AB (ref 137–147)
Sodium: 134 mEq/L — ABNORMAL LOW (ref 137–147)
TCO2: 18 mmol/L (ref 0–100)
TCO2: 18 mmol/L (ref 0–100)
TCO2: 20 mmol/L (ref 0–100)
TCO2: 19 mmol/L (ref 0–100)

## 2014-10-03 LAB — CBC
HCT: 9.5 % — ABNORMAL LOW (ref 39.0–52.0)
HCT: 30 % — ABNORMAL LOW (ref 39.0–52.0)
HEMOGLOBIN: 10.5 g/dL — AB (ref 13.0–17.0)
Hemoglobin: 2.1 g/dL — CL (ref 13.0–17.0)
MCH: 29.6 pg (ref 26.0–34.0)
MCH: 29.9 pg (ref 26.0–34.0)
MCHC: 22.1 g/dL — ABNORMAL LOW (ref 30.0–36.0)
MCHC: 35 g/dL (ref 30.0–36.0)
MCV: 133.8 fL — AB (ref 78.0–100.0)
MCV: 85.5 fL (ref 78.0–100.0)
Platelets: UNDETERMINED 10*3/uL (ref 150–400)
Platelets: 31 10*3/uL — ABNORMAL LOW (ref 150–400)
RBC: 3.51 MIL/uL — ABNORMAL LOW (ref 4.22–5.81)
RDW: 14.2 % (ref 11.5–15.5)
RDW: 14.9 % (ref 11.5–15.5)
WBC: 1.7 10*3/uL — AB (ref 4.0–10.5)
WBC: 12.7 10*3/uL — ABNORMAL HIGH (ref 4.0–10.5)

## 2014-10-03 LAB — RENAL FUNCTION PANEL
ALBUMIN: 3.1 g/dL — AB (ref 3.5–5.2)
ANION GAP: 12 (ref 5–15)
Albumin: 2.7 g/dL — ABNORMAL LOW (ref 3.5–5.2)
Anion gap: 15 (ref 5–15)
BUN: 36 mg/dL — ABNORMAL HIGH (ref 6–23)
BUN: 29 mg/dL — ABNORMAL HIGH (ref 6–23)
CHLORIDE: 102 meq/L (ref 96–112)
CO2: 17 meq/L — AB (ref 19–32)
CO2: 20 meq/L (ref 19–32)
CREATININE: 1.24 mg/dL (ref 0.50–1.35)
Calcium: 7.8 mg/dL — ABNORMAL LOW (ref 8.4–10.5)
Calcium: 7.2 mg/dL — ABNORMAL LOW (ref 8.4–10.5)
Chloride: 103 mEq/L (ref 96–112)
Creatinine, Ser: 1.2 mg/dL (ref 0.50–1.35)
GFR calc Af Amer: 76 mL/min — ABNORMAL LOW (ref >90)
GFR, EST AFRICAN AMERICAN: 79 mL/min — AB (ref >90)
GFR, EST NON AFRICAN AMERICAN: 65 mL/min — AB (ref >90)
GFR, EST NON AFRICAN AMERICAN: 68 mL/min — AB (ref >90)
Glucose, Bld: 235 mg/dL — ABNORMAL HIGH (ref 70–99)
Glucose, Bld: 160 mg/dL — ABNORMAL HIGH (ref 70–99)
POTASSIUM: 4.7 meq/L (ref 3.7–5.3)
Phosphorus: 2.7 mg/dL (ref 2.3–4.6)
Phosphorus: 3 mg/dL (ref 2.3–4.6)
Potassium: 5.7 mEq/L — ABNORMAL HIGH (ref 3.7–5.3)
SODIUM: 135 meq/L — AB (ref 137–147)
Sodium: 134 mEq/L — ABNORMAL LOW (ref 137–147)

## 2014-10-03 LAB — GLUCOSE, CAPILLARY
GLUCOSE-CAPILLARY: 355 mg/dL — AB (ref 70–99)
GLUCOSE-CAPILLARY: 226 mg/dL — AB (ref 70–99)
GLUCOSE-CAPILLARY: 197 mg/dL — AB (ref 70–99)
GLUCOSE-CAPILLARY: 172 mg/dL — AB (ref 70–99)
GLUCOSE-CAPILLARY: 160 mg/dL — AB (ref 70–99)
GLUCOSE-CAPILLARY: 127 mg/dL — AB (ref 70–99)
GLUCOSE-CAPILLARY: 126 mg/dL — AB (ref 70–99)
GLUCOSE-CAPILLARY: 130 mg/dL — AB (ref 70–99)
GLUCOSE-CAPILLARY: 134 mg/dL — AB (ref 70–99)
GLUCOSE-CAPILLARY: 151 mg/dL — AB (ref 70–99)
Glucose-Capillary: 123 mg/dL — ABNORMAL HIGH (ref 70–99)
Glucose-Capillary: 136 mg/dL — ABNORMAL HIGH (ref 70–99)
Glucose-Capillary: 145 mg/dL — ABNORMAL HIGH (ref 70–99)
Glucose-Capillary: 154 mg/dL — ABNORMAL HIGH (ref 70–99)
Glucose-Capillary: 284 mg/dL — ABNORMAL HIGH (ref 70–99)
Glucose-Capillary: 309 mg/dL — ABNORMAL HIGH (ref 70–99)
Glucose-Capillary: 341 mg/dL — ABNORMAL HIGH (ref 70–99)
Glucose-Capillary: 369 mg/dL — ABNORMAL HIGH (ref 70–99)

## 2014-10-03 LAB — BASIC METABOLIC PANEL
ANION GAP: 15 (ref 5–15)
ANION GAP: 11 (ref 5–15)
BUN: 35 mg/dL — AB (ref 6–23)
BUN: 27 mg/dL — ABNORMAL HIGH (ref 6–23)
CALCIUM: 6.8 mg/dL — AB (ref 8.4–10.5)
CHLORIDE: 103 meq/L (ref 96–112)
CO2: 17 mEq/L — ABNORMAL LOW (ref 19–32)
CO2: 20 mEq/L (ref 19–32)
CREATININE: 1.13 mg/dL (ref 0.50–1.35)
Calcium: 7.7 mg/dL — ABNORMAL LOW (ref 8.4–10.5)
Chloride: 104 mEq/L (ref 96–112)
Creatinine, Ser: 1.21 mg/dL (ref 0.50–1.35)
GFR calc Af Amer: 85 mL/min — ABNORMAL LOW (ref >90)
GFR, EST AFRICAN AMERICAN: 78 mL/min — AB (ref >90)
GFR, EST NON AFRICAN AMERICAN: 67 mL/min — AB (ref >90)
GFR, EST NON AFRICAN AMERICAN: 73 mL/min — AB (ref >90)
Glucose, Bld: 235 mg/dL — ABNORMAL HIGH (ref 70–99)
Glucose, Bld: 168 mg/dL — ABNORMAL HIGH (ref 70–99)
POTASSIUM: 4.7 meq/L (ref 3.7–5.3)
Potassium: 5.7 mEq/L — ABNORMAL HIGH (ref 3.7–5.3)
SODIUM: 135 meq/L — AB (ref 137–147)
Sodium: 135 mEq/L — ABNORMAL LOW (ref 137–147)

## 2014-10-03 LAB — APTT: aPTT: 31 seconds (ref 24–37)

## 2014-10-03 LAB — CALCIUM, IONIZED: Calcium, Ion: 1.09 mmol/L — ABNORMAL LOW (ref 1.12–1.23)

## 2014-10-03 LAB — PROTIME-INR
INR: 1.33 (ref 0.00–1.49)
Prothrombin Time: 16.6 seconds — ABNORMAL HIGH (ref 11.6–15.2)

## 2014-10-03 LAB — TROPONIN I
TROPONIN I: 1.86 ng/mL — AB (ref <0.30)
Troponin I: 1.72 ng/mL (ref <0.30)

## 2014-10-03 LAB — MAGNESIUM: MAGNESIUM: 2.2 mg/dL (ref 1.5–2.5)

## 2014-10-03 MED ORDER — HEPARIN BOLUS VIA INFUSION (CRRT)
1000.0000 [IU] | INTRAVENOUS | Status: DC | PRN
  Filled 2014-10-03: qty 1000

## 2014-10-03 MED ORDER — SODIUM CHLORIDE 0.9 % IJ SOLN
10.0000 mL | INTRAMUSCULAR | Status: DC | PRN
  Administered 2014-10-05: 30 mL
  Administered 2014-10-05: 10 mL

## 2014-10-03 MED ORDER — DEXTROSE 10 % IV SOLN: INTRAVENOUS | Status: DC | PRN

## 2014-10-03 MED ORDER — SODIUM CHLORIDE 0.9 % IV BOLUS (SEPSIS)
1000.0000 mL | Freq: Once | INTRAVENOUS | Status: AC
  Administered 2014-10-03: 1000 mL via INTRAVENOUS

## 2014-10-03 MED ORDER — SODIUM CHLORIDE 0.9 % IV SOLN
1.5000 g | Freq: Four times a day (QID) | INTRAVENOUS | Status: DC
  Administered 2014-10-03 – 2014-10-06 (×13): 1.5 g via INTRAVENOUS
  Filled 2014-10-03 (×15): qty 1.5

## 2014-10-03 MED ORDER — CETYLPYRIDINIUM CHLORIDE 0.05 % MT LIQD
7.0000 mL | Freq: Four times a day (QID) | OROMUCOSAL | Status: DC
  Administered 2014-10-03 – 2014-10-07 (×19): 7 mL via OROMUCOSAL

## 2014-10-03 MED ORDER — SODIUM CHLORIDE 0.9 % IV BOLUS (SEPSIS): 1000.0000 mL | Freq: Once | INTRAVENOUS | Status: DC

## 2014-10-03 MED ORDER — SODIUM CHLORIDE 0.9 % IJ SOLN
10.0000 mL | Freq: Two times a day (BID) | INTRAMUSCULAR | Status: DC
  Administered 2014-10-04: 30 mL
  Administered 2014-10-04 – 2014-10-09 (×10): 10 mL
  Administered 2014-10-09: 20 mL
  Administered 2014-10-10: 30 mL
  Administered 2014-10-11: 10 mL
  Administered 2014-10-12: 3 mL
  Administered 2014-10-13 – 2014-10-14 (×3): 10 mL
  Administered 2014-10-14: 3 mL
  Administered 2014-10-15: 10 mL

## 2014-10-03 MED ORDER — SODIUM CHLORIDE 0.9 % IV BOLUS (SEPSIS)
500.0000 mL | Freq: Once | INTRAVENOUS | Status: AC
  Administered 2014-10-03: 500 mL via INTRAVENOUS

## 2014-10-03 MED ORDER — CHLORHEXIDINE GLUCONATE 0.12 % MT SOLN
15.0000 mL | Freq: Two times a day (BID) | OROMUCOSAL | Status: DC
  Administered 2014-10-03 – 2014-10-06 (×9): 15 mL via OROMUCOSAL
  Filled 2014-10-03 (×8): qty 15

## 2014-10-03 MED ORDER — PRISMASOL BGK 4/2.5 32-4-2.5 MEQ/L IV SOLN
INTRAVENOUS | Status: DC
  Administered 2014-10-03 – 2014-10-06 (×3): via INTRAVENOUS_CENTRAL
  Filled 2014-10-03 (×7): qty 5000

## 2014-10-03 MED ORDER — HEPARIN SODIUM (PORCINE) 5000 UNIT/ML IJ SOLN
250.0000 [IU]/h | INTRAMUSCULAR | Status: DC
  Filled 2014-10-03: qty 2

## 2014-10-03 MED ORDER — VASOPRESSIN 20 UNIT/ML IJ SOLN
0.0300 [IU]/min | INTRAVENOUS | Status: DC
  Administered 2014-10-03 – 2014-10-04 (×2): 0.03 [IU]/min via INTRAVENOUS
  Filled 2014-10-03 (×3): qty 2

## 2014-10-03 MED ORDER — PRISMASOL BGK 4/2.5 32-4-2.5 MEQ/L IV SOLN
INTRAVENOUS | Status: DC
  Administered 2014-10-03 – 2014-10-06 (×7): via INTRAVENOUS_CENTRAL
  Filled 2014-10-03 (×10): qty 5000

## 2014-10-03 MED ORDER — PRISMASOL BGK 4/2.5 32-4-2.5 MEQ/L IV SOLN
INTRAVENOUS | Status: DC
  Administered 2014-10-03 – 2014-10-07 (×37): via INTRAVENOUS_CENTRAL
  Filled 2014-10-03 (×73): qty 5000

## 2014-10-03 MED ORDER — INSULIN GLARGINE 100 UNIT/ML ~~LOC~~ SOLN
10.0000 [IU] | SUBCUTANEOUS | Status: DC
  Administered 2014-10-03 – 2014-10-04 (×2): 10 [IU] via SUBCUTANEOUS
  Filled 2014-10-03 (×3): qty 0.1

## 2014-10-03 MED ORDER — SODIUM CHLORIDE 0.9 % IV SOLN
INTRAVENOUS | Status: DC
  Administered 2014-10-03: 02:00:00 via INTRAVENOUS

## 2014-10-03 MED ORDER — INSULIN ASPART 100 UNIT/ML ~~LOC~~ SOLN
2.0000 [IU] | SUBCUTANEOUS | Status: DC
  Administered 2014-10-03 (×2): 4 [IU] via SUBCUTANEOUS
  Administered 2014-10-03: 2 [IU] via SUBCUTANEOUS
  Administered 2014-10-04 (×4): 4 [IU] via SUBCUTANEOUS
  Administered 2014-10-04: 6 [IU] via SUBCUTANEOUS
  Administered 2014-10-04 – 2014-10-05 (×3): 4 [IU] via SUBCUTANEOUS
  Administered 2014-10-05: 6 [IU] via SUBCUTANEOUS

## 2014-10-03 NOTE — Progress Notes (Signed)
CRITICAL VALUE ALERT  Critical value received: troponin 1.86  Date of notification: 10/03/2014  Time of notification: 0130  Critical value read back:yes  Nurse who received alert:  Hyde Sires  MD notified (1st page):  [place]  Time of first page:  0135  MD notified (2nd page):  Time of second page:  Responding MD:  Adolm Joseph  Time MD responded:  [phone]

## 2014-10-03 NOTE — Progress Notes (Signed)
Patient ID: [name], male   DOB: 25-Oct-1961, 53 y.o.   MRN: [id number] S:intubated/sedated O:BP 105/52  Pulse 42  Temp(Src) 91.6 F (33.1 C) (Core (Comment))  Resp 18  Ht 5\' 10"  (1.778 m)  Wt 110.4 kg (243 lb 6.2 oz)  BMI 34.92 kg/m2  SpO2 100%  Intake/Output Summary (Last 24 hours) at 10/03/14 0903 Last data filed at 10/03/14 0836  Gross per 24 hour  Intake 2232.29 ml  Output    993 ml  Net 1239.29 ml   Intake/Output: I/O last 3 completed shifts: In: 2124.5 [I.V.:1514.5; IV Piggyback:610] Out: 908 [Urine:160; Other:748]  Intake/Output this shift:  Total I/O In: 107.8 [I.V.:107.8] Out: 85 [Urine:5; Other:80] Weight change:  Gen:WD WM intubated and sedated NWG:[id number] no rub Resp:cta Abd:+BS, soft Ext:no edema   Recent Labs Lab 10/02/14 1530  10/02/14 1734 10/02/14 1949 10/02/14 2130 10/02/14 2258 10/03/14 0034 10/03/14 0400 10/03/14 0425  NA 118*  < > 135* 133* 133* 132* 134* 135* 134*  K 3.4*  < > 6.9* 7.0* 6.8* 6.7* 6.3* 4.7 4.7  CL 92*  < > 106 109 103 108 105 103 102  CO2 <7*  --  19  --  18*  --   --  17* 17*  GLUCOSE 30*  < > 183* 287* 354* 356* 357* 235* 235*  BUN 7  < > 41* 37* 42* 39* 38* 35* 36*  CREATININE 0.24*  < > 1.38* 1.20 1.30 1.20 1.20 1.21 1.24  ALBUMIN  --   --  3.1*  --   --   --   --   --  3.1*  CALCIUM <4.0*  --  7.5*  --  7.7*  --   --  7.7* 7.8*  PHOS  --   --   --   --   --   --   --   --  2.7  AST  --   --  44*  --   --   --   --   --   --   ALT  --   --  35  --   --   --   --   --   --   < > = values in this interval not displayed. Liver Function Tests:  Recent Labs Lab 10/02/14 1734 10/03/14 0425  AST 44*  --   ALT 35  --   ALKPHOS 117  --   BILITOT 0.8  --   PROT 5.8*  --   ALBUMIN 3.1* 3.1*   No results found for this basename: LIPASE, AMYLASE,  in the last 168 hours No results found for this basename: AMMONIA,  in the last 168 hours CBC:  Recent Labs Lab 10/02/14 1530  10/02/14 1734  10/02/14 2258  10/03/14 0034 10/03/14 0500  WBC 1.7*  --  12.2*  --   --   --  12.7*  NEUTROABS  --   --  10.7*  --   --   --   --   HGB 2.1*  < > 10.3*  < > 9.9* 10.2* 10.5*  HCT 9.5*  < > 30.0*  < > 29.0* 30.0* 30.0*  MCV 133.8*  --  89.0  --   --   --  85.5  PLT PLATELET CLUMPS NOTED ON SMEAR, UNABLE TO ESTIMATE  --  36*  --   --   --  31*  < > = values in this interval not displayed. Cardiac Enzymes:  Recent Labs Lab 10/02/14 2200 10/03/14 0400  TROPONINI 1.86* 1.72*   CBG:  Recent Labs Lab 10/02/14 2244 10/03/14 0002 10/03/14 0111 10/03/14 0210 10/03/14 0725  GLUCAP 355* 369* 341* 309* 160*    Iron Studies: No results found for this basename: IRON, TIBC, TRANSFERRIN, FERRITIN,  in the last 72 hours Studies/Results: Ct Head Wo Contrast  10/02/2014   CLINICAL DATA:  53 year old male with history of trauma after after a fall during a syncopal episode during which the patient fell backward and struck his head. Status post CPR.  EXAM: CT HEAD WITHOUT CONTRAST  TECHNIQUE: Contiguous axial images were obtained from the base of the skull through the vertex without intravenous contrast.  COMPARISON:  No priors.  FINDINGS: No acute displaced skull fractures are identified. No acute intracranial abnormality. Specifically, no evidence of acute post-traumatic intracranial hemorrhage, no definite regions of acute/subacute cerebral ischemia, no focal mass, mass effect, hydrocephalus or abnormal intra or extra-axial fluid collections. The visualized paranasal sinuses and mastoids are well pneumatized, with exception of a small air-fluid level in the posterior aspect of the left maxillary sinus and right sphenoid sinus.  IMPRESSION: 1. Small amount of soft tissue swelling in the occipital scalp, without underlying displaced skull fracture or acute intracranial abnormalities. 2. Small air-fluid level in the left maxillary sinus and right sphenoid sinus. This is nonspecific, but correlation for signs of  sinusitis is recommended.   Electronically Signed   By: Trudie Reed M.D.   On: 10/02/2014 16:36   Dg Chest Port 1 View  10/03/2014   CLINICAL DATA:  Respiratory failure/hypoxia  EXAM: PORTABLE CHEST - 1 VIEW  COMPARISON:  October 02, 2014  FINDINGS: Endotracheal tube tip is 2.7 cm above the carina. Nasogastric tube tip and side port are below the diaphragm. Central catheter tip is in the superior vena cava. The questionable trace pneumothorax on the left seen 1 day prior is not appreciable on this examination. No pneumothorax is apparent currently.  There is persistent consolidation in the left lower lobe. Hazy opacity throughout much of the right lung is slightly less apparent currently. No new opacity is seen. Heart is enlarged with pulmonary vascularity within normal limits. No adenopathy.  IMPRESSION: Tube and catheter positions as described without appreciable pneumothorax. Infiltrate bilaterally, more on the right than on the left, is again noted. There is slightly less consolidation on the right and no appreciable change in the left lower lobe. No new opacity.   Electronically Signed   By: Bretta Bang M.D.   On: 10/03/2014 07:56   Dg Chest Port 1 View  10/02/2014   CLINICAL DATA:  Unsuccessful attempted left-sided central line placement. Subsequent encounter.  EXAM: PORTABLE CHEST - 1 VIEW  COMPARISON:  10/02/2014 (multiple examinations)  FINDINGS: Grossly unchanged enlarged cardiac silhouette and mediastinal contours. Stable positioning of support apparatus. Query trace left apical pneumothorax. Pulmonary vasculature remains indistinct with cephalization of flow with relative area of confluence about the right mid lung. Grossly unchanged left basilar/retrocardiac consolidative opacities. Trace of sided effusion is not excluded. Unchanged bones.  IMPRESSION: 1.  Stable positioning of support apparatus. 2. Query trace left apical pneumothorax. Continued attention on follow-up is recommended.  3. Similar findings of asymmetric pulmonary edema worse within the right mid and left lower lung. Critical Value/emergent results were called by telephone at the time of interpretation on 10/02/2014 at 10:23 pm to [facility], [place], who verbally acknowledged these results.   Electronically Signed   By: Holland Commons.D.  On: 10/02/2014 22:24   Dg Chest Portable 1 View  10/02/2014   CLINICAL DATA:  Readjustment of endotracheal tube.  EXAM: PORTABLE CHEST - 1 VIEW  COMPARISON:  Same day.  FINDINGS: Stable cardiomediastinal silhouette. Endotracheal tube is now seen with distal tip 3 cm above the carina in grossly good position. Right internal jugular catheter remains with distal tip overlying expected position of the SVC. Stable bilateral lung opacities are noted. No pneumothorax or pleural effusion is noted.  IMPRESSION: Stable bilateral lung opacities. Endotracheal tube in grossly good position.   Electronically Signed   By: Roque LiasJames  Green M.D.   On: 10/02/2014 17:04   Dg Chest Portable 1 View  10/02/2014   CLINICAL DATA:  Status post intubation and central line placement.  EXAM: PORTABLE CHEST - 1 VIEW  COMPARISON:  July 18, 2014.  FINDINGS: Right internal jugular catheter line is noted with distal tip in expected position of the SVC. Endotracheal tube is directed in the right mainstem bronchus. It is recommended at be withdrawn approximately 4 cm. Diffuse airspace opacities are noted in the right lung in left upper lobe, as well as large left retrocardiac opacity concerning for bilateral pneumonia. No pneumothorax or significant pleural effusion is noted. Bony thorax is intact.  IMPRESSION: Significantly increased bilateral lung opacities are noted concerning for pneumonia or possibly edema. Endotracheal tube is directed in the right mainstem bronchus; it is recommended to be withdrawn at least 4 cm. Critical Value/emergent results were called by telephone at the time of interpretation on 10/02/2014 at 4:15 pm to Dr.  Zadie RhineNALD WICKLINE , who verbally acknowledged these results.   Electronically Signed   By: Roque LiasJames  Green M.D.   On: 10/02/2014 16:15   Dg Abd Portable 1v  10/02/2014   CLINICAL DATA:  OG tube insertion  EXAM: PORTABLE ABDOMEN - 1 VIEW  COMPARISON:  None.  FINDINGS: Enteric tube tip and side port projects over the expected location of the gastric fundus.  Paucity of bowel gas without definite evidence of obstruction. No supine evidence of pneumoperitoneum. No definite pneumatosis or portal venous gas.  Limited visualization the lower thorax demonstrates enlargement of the cardiac silhouette and epicardial pacer pads overlying the left ventricular apex.  IMPRESSION: Enteric tube tip and side port overlie the gastric fundus.   Electronically Signed   By: Simonne ComeJohn  Watts M.D.   On: 10/02/2014 22:29   . antiseptic oral rinse  7 mL Mouth Rinse QID  . artificial tears  1 application Both Eyes 3 times per day  . chlorhexidine  15 mL Mouth Rinse BID  . fentaNYL  100 mcg Intravenous Once  . pantoprazole (PROTONIX) IV  40 mg Intravenous Q24H    BMET    Component Value Date/Time   NA 134* 10/03/2014 0425   K 4.7 10/03/2014 0425   CL 102 10/03/2014 0425   CO2 17* 10/03/2014 0425   GLUCOSE 235* 10/03/2014 0425   BUN 36* 10/03/2014 0425   CREATININE 1.24 10/03/2014 0425   CREATININE 1.87* 07/18/2014 1647   CALCIUM 7.8* 10/03/2014 0425   GFRNONAA 65* 10/03/2014 0425   GFRAA 76* 10/03/2014 0425   CBC    Component Value Date/Time   WBC 12.7* 10/03/2014 0500   RBC 3.51* 10/03/2014 0500   HGB 10.5* 10/03/2014 0500   HCT 30.0* 10/03/2014 0500   PLT 31* 10/03/2014 0500   MCV 85.5 10/03/2014 0500   MCH 29.9 10/03/2014 0500   MCHC 35.0 10/03/2014 0500   RDW 14.9 10/03/2014 0500  LYMPHSABS 0.5* 10/02/2014 1734   MONOABS 1.0 10/02/2014 1734   EOSABS 0.1 10/02/2014 1734   BASOSABS 0.0 10/02/2014 1734     Assessment/Plan:  1. ARF, oliguric- in setting of cardiac arrest while on lisinopril. Started on  CVVHD yesterday due to hyperkalemia which has since improved.  Low CVP of 4 will keep + and cont to follow UOP and daily elecrtrolytes 2. Hyperkalemia- related to cardiac arrest in setting of ACE-inhibition and aldactone as well as metabolic acidosis and hyperglycemia.  Improved with CVVHD 3. Cardiac arrest- on amio and cardiology following 4. VDRF- per PCCM 5. SIRS- hypovolemic- will keep + with IVF's, cont with max pressors 6. DM- per PCCM 7. Anemia- normocytic.  ?dillutional or ABLA, will follow 8. Cirrhosis 9. Thrombocytopenia- likely due to cirrhosis  Favio Moder A

## 2014-10-03 NOTE — Progress Notes (Signed)
eLink Physician-Brief Progress Note Patient Name: [name] DOB: 1961-05-09 MRN: [id number]   Date of Service  10/03/2014  HPI/Events of Note  CVP =6, pressor requirement increasing  eICU Interventions  Will give another 1L NS bolus over 2 hrs, keep net Positive while on CRRT      Intervention Category Intermediate Interventions: Other:  Isabellah Sobocinski 10/03/2014, 4:56 PM

## 2014-10-03 NOTE — Progress Notes (Signed)
I was called due to potassium going up from this AM after zero K was changed to 4 K in the CRRT- K is currently 5.7.  I still think that K should trend down on a 4 K bath so will keep it as is and recheck potassium at 10 PM.  It was also reported the filter clotting times 2- since hgb is good will start heparin with CRRT.[name]    Iridessa Harrow A

## 2014-10-03 NOTE — Progress Notes (Signed)
Called Dr. Kathrene Bongo to report K of 5.7.  Will recheck BMET at 2200 and was instructed to call MD for a K greater than 6.  In addition, orders received to begin Heparin syringe for filter anticoagulation due to filter clotting x 2 today.

## 2014-10-03 NOTE — Progress Notes (Signed)
eLink Physician-Brief Progress Note Patient Name: [name] DOB: 1961/05/27 MRN: [id number]   Date of Service  10/03/2014  HPI/Events of Note  MAP < goal on max dsoe of NE  eICU Interventions  Add VP     Intervention Category Major Interventions: Shock - evaluation and management  Billy Fischer 10/03/2014, 6:39 AM

## 2014-10-03 NOTE — Progress Notes (Signed)
Notified E-link MD in regards to CVP being 4 around 1500. MD to repeat CVP in 1 hour and follow up.

## 2014-10-03 NOTE — Progress Notes (Signed)
Patient Name: [name] Date of Encounter: 10/03/2014     Active Problems:   Respiratory failure   Cardiac arrest   Cardiogenic shock    SUBJECTIVE  The patient is sedated on vent. He has developed marked junctional bradycardia. His amiodarone is being stopped. On BP support with levophed and vasopressin.  CURRENT MEDS . ampicillin-sulbactam (UNASYN) IV  1.5 g Intravenous Q6H  . antiseptic oral rinse  7 mL Mouth Rinse QID  . artificial tears  1 application Both Eyes 3 times per day  . chlorhexidine  15 mL Mouth Rinse BID  . fentaNYL  100 mcg Intravenous Once  . pantoprazole (PROTONIX) IV  40 mg Intravenous Q24H    OBJECTIVE  Filed Vitals:   10/03/14 1015 10/03/14 1030 10/03/14 1045 10/03/14 1100  BP:      Pulse: 43 41 42 41  Temp:    92.7 F (33.7 C)  TempSrc:    Core (Comment)  Resp: 5 0 0 7  Height:      Weight:      SpO2: 100% 100% 100% 100%    Intake/Output Summary (Last 24 hours) at 10/03/14 1121 Last data filed at 10/03/14 1100  Gross per 24 hour  Intake 2588.79 ml  Output   1069 ml  Net 1519.79 ml   Filed Weights   10/02/14 1534 10/02/14 1700 10/02/14 1945  Weight: 227 lb 15.3 oz (103.4 kg) 243 lb 6.2 oz (110.4 kg) 243 lb 6.2 oz (110.4 kg)    PHYSICAL EXAM  General: Intubated, sedated Neuro:  Psych:  HEENT:  Normal  Neck: Supple without bruits or JVD. Lungs:  Resp regular and unlabored, Lungs clear anteriorly Heart: RRR no s3, s4, or murmurs. Distant heart tones. Abdomen: Soft, no apparent tenderness. Extremities: vasoconstricted.  Accessory Clinical Findings  CBC  Recent Labs  10/02/14 1734  10/03/14 0500 10/03/14 0836  WBC 12.2*  --  12.7*  --   NEUTROABS 10.7*  --   --   --   HGB 10.3*  < > 10.5* 10.2*  HCT 30.0*  < > 30.0* 30.0*  MCV 89.0  --  85.5  --   PLT 36*  --  31*  --   < > = values in this interval not displayed. Basic Metabolic Panel  Recent Labs  10/02/14 1734  10/03/14 0400 10/03/14 0425 10/03/14 0500  10/03/14 0836  NA 135*  < > 135* 134*  --  133*  K 6.9*  < > 4.7 4.7  --  4.5  CL 106  < > 103 102  --  111  CO2 19  < > 17* 17*  --   --   GLUCOSE 183*  < > 235* 235*  --  149*  BUN 41*  < > 35* 36*  --  29*  CREATININE 1.38*  < > 1.21 1.24  --  1.20  CALCIUM 7.5*  < > 7.7* 7.8*  --   --   MG 2.2  --   --   --  2.2  --   PHOS  --   --   --  2.7  --   --   < > = values in this interval not displayed. Liver Function Tests  Recent Labs  10/02/14 1734 10/03/14 0425  AST 44*  --   ALT 35  --   ALKPHOS 117  --   BILITOT 0.8  --   PROT 5.8*  --   ALBUMIN 3.1* 3.1*  No results found for this basename: LIPASE, AMYLASE,  in the last 72 hours Cardiac Enzymes  Recent Labs  10/02/14 2200 10/03/14 0400  TROPONINI 1.86* 1.72*   BNP No components found with this basename: POCBNP,  D-Dimer No results found for this basename: DDIMER,  in the last 72 hours Hemoglobin A1C No results found for this basename: HGBA1C,  in the last 72 hours Fasting Lipid Panel No results found for this basename: CHOL, HDL, LDLCALC, TRIG, CHOLHDL, LDLDIRECT,  in the last 72 hours Thyroid Function Tests No results found for this basename: TSH, T4TOTAL, FREET3, T3FREE, THYROIDAB,  in the last 72 hours  TELE  Junctional bradycardia, occasional sinus beats. QTc 461  ECG  No acute ST elevation.  Radiology/Studies  Ct Head Wo Contrast  10/02/2014   CLINICAL DATA:  53 year old male with history of trauma after after a fall during a syncopal episode during which the patient fell backward and struck his head. Status post CPR.  EXAM: CT HEAD WITHOUT CONTRAST  TECHNIQUE: Contiguous axial images were obtained from the base of the skull through the vertex without intravenous contrast.  COMPARISON:  No priors.  FINDINGS: No acute displaced skull fractures are identified. No acute intracranial abnormality. Specifically, no evidence of acute post-traumatic intracranial hemorrhage, no definite regions of  acute/subacute cerebral ischemia, no focal mass, mass effect, hydrocephalus or abnormal intra or extra-axial fluid collections. The visualized paranasal sinuses and mastoids are well pneumatized, with exception of a small air-fluid level in the posterior aspect of the left maxillary sinus and right sphenoid sinus.  IMPRESSION: 1. Small amount of soft tissue swelling in the occipital scalp, without underlying displaced skull fracture or acute intracranial abnormalities. 2. Small air-fluid level in the left maxillary sinus and right sphenoid sinus. This is nonspecific, but correlation for signs of sinusitis is recommended.   Electronically Signed   By: Trudie Reed M.D.   On: 10/02/2014 16:36   Dg Chest Port 1 View  10/03/2014   CLINICAL DATA:  Respiratory failure/hypoxia  EXAM: PORTABLE CHEST - 1 VIEW  COMPARISON:  October 02, 2014  FINDINGS: Endotracheal tube tip is 2.7 cm above the carina. Nasogastric tube tip and side port are below the diaphragm. Central catheter tip is in the superior vena cava. The questionable trace pneumothorax on the left seen 1 day prior is not appreciable on this examination. No pneumothorax is apparent currently.  There is persistent consolidation in the left lower lobe. Hazy opacity throughout much of the right lung is slightly less apparent currently. No new opacity is seen. Heart is enlarged with pulmonary vascularity within normal limits. No adenopathy.  IMPRESSION: Tube and catheter positions as described without appreciable pneumothorax. Infiltrate bilaterally, more on the right than on the left, is again noted. There is slightly less consolidation on the right and no appreciable change in the left lower lobe. No new opacity.   Electronically Signed   By: Bretta Bang M.D.   On: 10/03/2014 07:56   Dg Chest Port 1 View  10/02/2014   CLINICAL DATA:  Unsuccessful attempted left-sided central line placement. Subsequent encounter.  EXAM: PORTABLE CHEST - 1 VIEW   COMPARISON:  10/02/2014 (multiple examinations)  FINDINGS: Grossly unchanged enlarged cardiac silhouette and mediastinal contours. Stable positioning of support apparatus. Query trace left apical pneumothorax. Pulmonary vasculature remains indistinct with cephalization of flow with relative area of confluence about the right mid lung. Grossly unchanged left basilar/retrocardiac consolidative opacities. Trace of sided effusion is not excluded. Unchanged bones.  IMPRESSION: 1.  Stable positioning of support apparatus. 2. Query trace left apical pneumothorax. Continued attention on follow-up is recommended. 3. Similar findings of asymmetric pulmonary edema worse within the right mid and left lower lung. Critical Value/emergent results were called by telephone at the time of interpretation on 10/02/2014 at 10:23 pm to [facility], [place], who verbally acknowledged these results.   Electronically Signed   By: Simonne ComeJohn  Watts M.D.   On: 10/02/2014 22:24   Dg Chest Portable 1 View  10/02/2014   CLINICAL DATA:  Readjustment of endotracheal tube.  EXAM: PORTABLE CHEST - 1 VIEW  COMPARISON:  Same day.  FINDINGS: Stable cardiomediastinal silhouette. Endotracheal tube is now seen with distal tip 3 cm above the carina in grossly good position. Right internal jugular catheter remains with distal tip overlying expected position of the SVC. Stable bilateral lung opacities are noted. No pneumothorax or pleural effusion is noted.  IMPRESSION: Stable bilateral lung opacities. Endotracheal tube in grossly good position.   Electronically Signed   By: Roque LiasJames  Green M.D.   On: 10/02/2014 17:04   Dg Chest Portable 1 View  10/02/2014   CLINICAL DATA:  Status post intubation and central line placement.  EXAM: PORTABLE CHEST - 1 VIEW  COMPARISON:  July 18, 2014.  FINDINGS: Right internal jugular catheter line is noted with distal tip in expected position of the SVC. Endotracheal tube is directed in the right mainstem bronchus. It is recommended at  be withdrawn approximately 4 cm. Diffuse airspace opacities are noted in the right lung in left upper lobe, as well as large left retrocardiac opacity concerning for bilateral pneumonia. No pneumothorax or significant pleural effusion is noted. Bony thorax is intact.  IMPRESSION: Significantly increased bilateral lung opacities are noted concerning for pneumonia or possibly edema. Endotracheal tube is directed in the right mainstem bronchus; it is recommended to be withdrawn at least 4 cm. Critical Value/emergent results were called by telephone at the time of interpretation on 10/02/2014 at 4:15 pm to Dr. Zadie RhineNALD WICKLINE , who verbally acknowledged these results.   Electronically Signed   By: Roque LiasJames  Green M.D.   On: 10/02/2014 16:15   Dg Abd Portable 1v  10/02/2014   CLINICAL DATA:  OG tube insertion  EXAM: PORTABLE ABDOMEN - 1 VIEW  COMPARISON:  None.  FINDINGS: Enteric tube tip and side port projects over the expected location of the gastric fundus.  Paucity of bowel gas without definite evidence of obstruction. No supine evidence of pneumoperitoneum. No definite pneumatosis or portal venous gas.  Limited visualization the lower thorax demonstrates enlargement of the cardiac silhouette and epicardial pacer pads overlying the left ventricular apex.  IMPRESSION: Enteric tube tip and side port overlie the gastric fundus.   Electronically Signed   By: Simonne ComeJohn  Watts M.D.   On: 10/02/2014 22:29    ASSESSMENT AND PLAN 1. Cardiac arrest  - likely primary event in the setting of nonischemic cardiomyopathy  - QTC 472, continue to monitor. Keep K > 4.0 and Mg > 2.0  - ROSC after shocked x 2 by AED  - normally need cath, however ?cath candidate with severe thrombocytopenia.. Once patient stable, can consider coronary CT and EP consult for ICD for secondary prevention (depend on his prognosis and neurological recovery)  2. H/o severe ischemic cardiomyopathy  - EF 35% by echo, 19% by stress test  - Stress test  noted inferior and apex scar  3. Severe thrombocytopenia, platelet 31 on 09/18/2014  4. bicuspid aortic valve  5. H/o mild pericardial effusion on recent echo  6. h/o pancreatitis  7. H/o recent sepsis with biliary origin: refused by surgery to cholecystectomy due to perioperative risk with h/o cirrhosis  8. Cirrhosis  9. Obesity  10. DM 11. Junctional bradycardia.  Amiodarone has been stopped.    Signed, Cassell Clement MD

## 2014-10-03 NOTE — Care Management Note (Addendum)
  Page 2 of 2   10/16/2014     12:06:37 PM CARE MANAGEMENT NOTE 10/16/2014  Patient:  [name], [name]   Account Number:  [id number]  Date Initiated:  10/03/2014  Documentation initiated by:  Junius Creamer  Subjective/Objective Assessment:   adm w resp failure, vent     Action/Plan:   alone, pcp dr Horatio Pel   Anticipated DC Date:  10/17/2014   Anticipated DC Plan:  HOME W HOME HEALTH SERVICES  In-house referral  NA      DC Planning Services  CM consult      [facility] Choice  HOME HEALTH   Choice offered to / List presented to:  C-1 Patient        HH arranged  HH-1 RN  HH-10 DISEASE MANAGEMENT  HH-2 PT      HH agency  [facility]   Status of service:  Completed, signed off Medicare Important Message given?   (If response is "NO", the following Medicare IM given date fields will be blank) Date Medicare IM given:   Medicare IM given by:   Date Additional Medicare IM given:   Additional Medicare IM given by:    Discharge Disposition:  HOME W HOME HEALTH SERVICES  Per UR Regulation:  Reviewed for med. necessity/level of care/duration of stay  If discussed at Long Length of Stay Meetings, dates discussed:   10/07/2014  10/09/2014  10/14/2014    Comments:  Aggie Cosier Clessie Karras RN, BSN, MSHL, CCM  Nurse - Case Manager,  (Unit [phone]  10/16/2014 Lasix 40 mg IV every 8 hours today, hopefully to po tomorrow. 3n1 ordered with [facility]/Jermaine to prepare for d/c Specialty Med Review:  Plavix dispo:  Home with HHS  Kobi Aller RN, BSN, MSHL, CCM  Nurse - Case Manager,  (Unit [phone]  10/15/2014 IM:  [name] 10/15/14 Procedure: PTCA and stenting of the mid LAD and distal RCA into the PDA furosemide (LASIX) injection 40 mg  :  Dose 40 mg  : Intravenous  :  Every 8 hours Social:  From  home with sister and disabled brother. PT RECS:  HH PT DME RECS:  3n1 Dispo Plan:  Home with HHS: RN, PT  -([facility]/Donna notified) DME:  3n1 - CM will  order closer to  d/c date   10/14/14 [name], RN, BSN [phone] Pt adm on 10/15 with cardiac arrest/cardiogenic shock. PTA, pt lived at home with sister and disabled brother.  PT recommending home with HH and 3 in 1 BSC, OT consult.  Will follow for dc needs as pt progresses.

## 2014-10-03 NOTE — Progress Notes (Signed)
eLink Physician-Brief Progress Note Patient Name: [name] DOB: [birth year] MRN: [id number]   Date of Service  10/03/2014  HPI/Events of Note  cvp=3  eICU Interventions  1L NS bolus given     Intervention Category Intermediate Interventions: Hypotension - evaluation and management  Eljay Lave 10/03/2014, 9:19 PM

## 2014-10-03 NOTE — Progress Notes (Signed)
PULMONARY / CRITICAL CARE MEDICINE   Name: [name] MRN: [id number] DOB: July 08, 1961    ADMISSION DATE:  10/02/2014  REFERRING MD :  EDP  CHIEF COMPLAINT:  Post arrest   INITIAL PRESENTATION: 53yo male with hx non - ischemic cardiomyopathy EF 35-40%, cirrhosis, DM, cholelithiasis presented 10/15 after arrest at work with EMS trained bystander CPR/defib. Pt suddenly collapsed, fell backward and hit his head.  AED advised shockable rhythm. Approx 10 minutes CPR with ROSC before EMS arrival.  PCCM called to admit.  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 with splenomegaly, ascites and thrombocytopenia, gallstone pancreatitis,Type II DM with diabetic nephropathy, neuropathy and retinopathy ,07/2014  hospitalized for e coli  sepsis     STUDIES:  CT head 10/15>>>neg, Small air-fluid level in the left maxillary sinus and right sphenoid sinus    SIGNIFICANT EVENTS: 10/15 hyperkalemia (on aldactone & lisinopril) requiring CRRT   SUBJECTIVE: Cooled to 33  Bradycardic , on max pressors Anuric Sedated , paralysed On CRRT  VITAL SIGNS: Temp:  [90.1 F (32.3 C)-96.1 F (35.6 C)] 91.6 F (33.1 C) (10/16 0800) Pulse Rate:  [35-102] 42 (10/16 0802) Resp:  [0-19] 18 (10/16 0802) BP: (60-168)/(24-79) 105/52 mmHg (10/16 0802) SpO2:  [93 %-100 %] 100 % (10/16 0802) Arterial Line BP: (75-196)/(41-86) 105/51 mmHg (10/16 0800) FiO2 (%):  [50 %-100 %] 50 % (10/16 0802) Weight:  [103.4 kg (227 lb 15.3 oz)-110.4 kg (243 lb 6.2 oz)] 110.4 kg (243 lb 6.2 oz) (10/15 1945) HEMODYNAMICS: CVP:  [4 mmHg-7 mmHg] 4 mmHg VENTILATOR SETTINGS: Vent Mode:  [-] PRVC FiO2 (%):  [50 %-100 %] 50 % Set Rate:  [18 bmp] 18 bmp Vt Set:  [500 mL] 500 mL PEEP:  [5 cmH20] 5 cmH20 Plateau Pressure:  [16 cmH20-27 cmH20] 16 cmH20 INTAKE / OUTPUT:  Intake/Output Summary (Last 24 hours) at 10/03/14 0905 Last data  filed at 10/03/14 0836  Gross per 24 hour  Intake 2232.29 ml  Output    993 ml  Net 1239.29 ml    PHYSICAL EXAMINATION: General:  Chronically ill appearing male, acutely ill  Neuro:  Sedated, paralysed ,on vent HEENT:  Mm dry, no JVD Cardiovascular:  s1s2 tachy, distant Lungs:  resps even non labored on vent, good air entry BL, bloody secretions  Abdomen:  Obese, round, soft, hypoactive bs Musculoskeletal:  Pale, cool and dry, scant BLE edema   LABS:  CBC  Recent Labs Lab 10/02/14 1530  10/02/14 1734  10/02/14 2258 10/03/14 0034 10/03/14 0500  WBC 1.7*  --  12.2*  --   --   --  12.7*  HGB 2.1*  < > 10.3*  < > 9.9* 10.2* 10.5*  HCT 9.5*  < > 30.0*  < > 29.0* 30.0* 30.0*  PLT PLATELET CLUMPS NOTED ON SMEAR, UNABLE TO ESTIMATE  --  36*  --   --   --  31*  < > = values in this interval not displayed. Coag's  Recent Labs Lab 10/02/14 1700 10/02/14 2344  APTT 32 31  INR 1.30 1.33   BMET  Recent Labs Lab 10/02/14 2130  10/03/14 0034 10/03/14 0400 10/03/14 0425  NA 133*  < > 134* 135* 134*  K 6.8*  < > 6.3* 4.7 4.7  CL 103  < > 105 103 102  CO2 18*  --   --  17* 17*  BUN 42*  < >  38* 35* 36*  CREATININE 1.30  < > 1.20 1.21 1.24  GLUCOSE 354*  < > 357* 235* 235*  < > = values in this interval not displayed. Electrolytes  Recent Labs Lab 10/02/14 1734 10/02/14 2130 10/03/14 0400 10/03/14 0425 10/03/14 0500  CALCIUM 7.5* 7.7* 7.7* 7.8*  --   MG 2.2  --   --   --  2.2  PHOS  --   --   --  2.7  --    Sepsis Markers No results found for this basename: LATICACIDVEN, PROCALCITON, O2SATVEN,  in the last 168 hours ABG  Recent Labs Lab 10/02/14 1717 10/02/14 2156  PHART 7.238* 7.259*  PCO2ART 48.3* 40.0  PO2ART 207.0* 282.0*   Liver Enzymes  Recent Labs Lab 10/02/14 1734 10/03/14 0425  AST 44*  --   ALT 35  --   ALKPHOS 117  --   BILITOT 0.8  --   ALBUMIN 3.1* 3.1*   Cardiac Enzymes  Recent Labs Lab 10/02/14 2200 10/03/14 0400   TROPONINI 1.86* 1.72*   Glucose  Recent Labs Lab 10/02/14 1947 10/02/14 2244 10/03/14 0002 10/03/14 0111 10/03/14 0210 10/03/14 0725  GLUCAP 265* 355* 369* 341* 309* 160*    Imaging Ct Head Wo Contrast  10/02/2014   CLINICAL DATA:  53 year old male with history of trauma after after a fall during a syncopal episode during which the patient fell backward and struck his head. Status post CPR.  EXAM: CT HEAD WITHOUT CONTRAST  TECHNIQUE: Contiguous axial images were obtained from the base of the skull through the vertex without intravenous contrast.  COMPARISON:  No priors.  FINDINGS: No acute displaced skull fractures are identified. No acute intracranial abnormality. Specifically, no evidence of acute post-traumatic intracranial hemorrhage, no definite regions of acute/subacute cerebral ischemia, no focal mass, mass effect, hydrocephalus or abnormal intra or extra-axial fluid collections. The visualized paranasal sinuses and mastoids are well pneumatized, with exception of a small air-fluid level in the posterior aspect of the left maxillary sinus and right sphenoid sinus.  IMPRESSION: 1. Small amount of soft tissue swelling in the occipital scalp, without underlying displaced skull fracture or acute intracranial abnormalities. 2. Small air-fluid level in the left maxillary sinus and right sphenoid sinus. This is nonspecific, but correlation for signs of sinusitis is recommended.   Electronically Signed   By: Trudie Reed M.D.   On: 10/02/2014 16:36   Dg Chest Port 1 View  10/02/2014   CLINICAL DATA:  Unsuccessful attempted left-sided central line placement. Subsequent encounter.  EXAM: PORTABLE CHEST - 1 VIEW  COMPARISON:  10/02/2014 (multiple examinations)  FINDINGS: Grossly unchanged enlarged cardiac silhouette and mediastinal contours. Stable positioning of support apparatus. Query trace left apical pneumothorax. Pulmonary vasculature remains indistinct with cephalization of flow with  relative area of confluence about the right mid lung. Grossly unchanged left basilar/retrocardiac consolidative opacities. Trace of sided effusion is not excluded. Unchanged bones.  IMPRESSION: 1.  Stable positioning of support apparatus. 2. Query trace left apical pneumothorax. Continued attention on follow-up is recommended. 3. Similar findings of asymmetric pulmonary edema worse within the right mid and left lower lung. Critical Value/emergent results were called by telephone at the time of interpretation on 10/02/2014 at 10:23 pm to [facility], [place], who verbally acknowledged these results.   Electronically Signed   By: Simonne Come M.D.   On: 10/02/2014 22:24   Dg Chest Portable 1 View  10/02/2014   CLINICAL DATA:  Readjustment of endotracheal tube.  EXAM: PORTABLE CHEST -  1 VIEW  COMPARISON:  Same day.  FINDINGS: Stable cardiomediastinal silhouette. Endotracheal tube is now seen with distal tip 3 cm above the carina in grossly good position. Right internal jugular catheter remains with distal tip overlying expected position of the SVC. Stable bilateral lung opacities are noted. No pneumothorax or pleural effusion is noted.  IMPRESSION: Stable bilateral lung opacities. Endotracheal tube in grossly good position.   Electronically Signed   By: Roque Lias M.D.   On: 10/02/2014 17:04   Dg Chest Portable 1 View  10/02/2014   CLINICAL DATA:  Status post intubation and central line placement.  EXAM: PORTABLE CHEST - 1 VIEW  COMPARISON:  July 18, 2014.  FINDINGS: Right internal jugular catheter line is noted with distal tip in expected position of the SVC. Endotracheal tube is directed in the right mainstem bronchus. It is recommended at be withdrawn approximately 4 cm. Diffuse airspace opacities are noted in the right lung in left upper lobe, as well as large left retrocardiac opacity concerning for bilateral pneumonia. No pneumothorax or significant pleural effusion is noted. Bony thorax is intact.  IMPRESSION:  Significantly increased bilateral lung opacities are noted concerning for pneumonia or possibly edema. Endotracheal tube is directed in the right mainstem bronchus; it is recommended to be withdrawn at least 4 cm. Critical Value/emergent results were called by telephone at the time of interpretation on 10/02/2014 at 4:15 pm to Dr. Zadie Rhine , who verbally acknowledged these results.   Electronically Signed   By: Roque Lias M.D.   On: 10/02/2014 16:15   Dg Abd Portable 1v  10/02/2014   CLINICAL DATA:  OG tube insertion  EXAM: PORTABLE ABDOMEN - 1 VIEW  COMPARISON:  None.  FINDINGS: Enteric tube tip and side port projects over the expected location of the gastric fundus.  Paucity of bowel gas without definite evidence of obstruction. No supine evidence of pneumoperitoneum. No definite pneumatosis or portal venous gas.  Limited visualization the lower thorax demonstrates enlargement of the cardiac silhouette and epicardial pacer pads overlying the left ventricular apex.  IMPRESSION: Enteric tube tip and side port overlie the gastric fundus.   Electronically Signed   By: Simonne Come M.D.   On: 10/02/2014 22:29     ASSESSMENT / PLAN:  PULMONARY OETT 10/15>>> Acute respiratory failure, post arrest Apical left pneumothx noted on CXR 10/15, resolved on FU CXR 10/16 (note HD cath attempts [facility])  P:   Vent support F/u CXR F/u ABG  PRN BD   CARDIOVASCULAR CVL R IJ CVL 10/15>>> Cardiac arrest - shockable rhythm per AED, ~10 mins CPR Hx ischemic cardiomyopathy - EF 35-40% per echo 07/2014, but 19% per nuclear study Hx HTN Cardiogenic shock Bradycardia, junctional   - on amio/hypothermia P:  Cardiology following- Defer decision for anticoagulation to cards  Hypothermia protocol -rewarm at 2100 ASA volume to increase CVP to 10-12 range , keep pos on CRRT Will need AICD if survives this   RENAL Hyperkalemia on admit -related to lisinopril/aldactone -resolved AKI - anuric, post arrest P:    Ct CRRT, keep pos until CVP 12 then even  GASTROINTESTINAL Hx cholelithiasis -deemed not to be a surgical candidate for cholecystectomy recently.  Hx cirrhosis - ?NASH v ETOH -not felt to be a surgical liver transplant candidate both due to multiple comorbidites  P:   Trend LFT's  PPI  NPO for now   HEMATOLOGIC Severe thrombocytopenia  P:  F/u cbc  SQ heparin  Monitor plt  INFECTIOUS No active issue  P:   Monitor wbc, fever curve off abx  Add empiric unasyn, note recent h/o e coli sepsis  ENDOCRINE Hx DM    P:   Insulin gtt - transition only after rewarming  NEUROLOGIC AMS - post cardiac arrest  Fall  P:   Sedation per hypothermia protocol    Family updated: 10/15 at bedside in ER   I have personally obtained a history, examined the patient, evaluated laboratory and imaging results, formulated the assessment and plan and placed orders.  CRITICAL CARE: The patient is critically ill with multiple organ systems failure and requires high complexity decision making for assessment and support, frequent evaluation and titration of therapies, application of advanced monitoring technologies and extensive interpretation of multiple databases. Critical Care Time devoted to patient care services described in this note is 45 minutes.   *Care during the described time interval was provided by me and/or other providers on the critical care team. I have reviewed this patient's available data, including medical history, events of note, physical examination and test results as part of my evaluation.  Cyril Mourningakesh Caidan Hubbert MD. Tonny BollmanFCCP. [facility] Pager [phone] If no response call [phone]

## 2014-10-03 NOTE — Progress Notes (Addendum)
INITIAL NUTRITION ASSESSMENT  DOCUMENTATION CODES Per approved criteria  -Obesity Unspecified   INTERVENTION:  If pt remains intubated recommend:  Initiate Vital High Protein @ 20 ml/hr via OG tube and increase by 10 ml every 4 hours to goal rate of 40 ml/hr.   30 ml Prostat five times per day.    Tube feeding regimen provides 1460 kcal (68% of needs), 159 grams of protein, and 802 ml of H2O.   NUTRITION DIAGNOSIS: Inadequate oral intake related to inability to eat as evidenced by NPO status  Goal: Enteral nutrition to provide 60-70% of estimated calorie needs (22-25 kcals/kg ideal body weight) and 100% of estimated protein needs, based on ASPEN guidelines for permissive underfeeding in critically ill obese individuals  Monitor:  Respiratory status, arctic sun, TF initiation and tolerance, weight trends, labs  Reason for Assessment: Pt identified as at nutrition risk on the Malnutrition Screen Tool/Ventilator   53 y.o. male  Admitting Dx: Cardiac Arrest  ASSESSMENT: Pt with hx non - ischemic cardiomyopathy EF 35-40%, cirrhosis, DM, cholelithiasis presented 10/15 after arrest at work with EMS trained bystander CPR/defib.  Potassium 6.9 now WNL. Pt started on CVVHD. Pt is paralyzed.   Patient is currently intubated on ventilator support MV: 8.8 L/min Temp (24hrs), Avg:92.5 F (33.6 C), Min:90.1 F (32.3 C), Max:96.1 F (35.6 C)  RN at bedside. Pt on arctic sun protocol and will not start cooling until 10/16 pm - 10/17 am.  No signs of fat or muscle depletion noted. Pt does have on large cooling pads and unable to assess all areas.   Height: Ht Readings from Last 1 Encounters:  10/02/14 5\' 10"  (1.778 m)    Weight: Wt Readings from Last 1 Encounters:  10/02/14 243 lb 6.2 oz (110.4 kg)    Ideal Body Weight: 75.4 kg   % Ideal Body Weight: 146%  Wt Readings from Last 10 Encounters:  10/02/14 243 lb 6.2 oz (110.4 kg)  10/02/14 243 lb 6.2 oz (110.4 kg)  09/30/14  228 lb (103.42 kg)  09/19/14 230 lb (104.327 kg)  09/18/14 229 lb (103.874 kg)  09/10/14 225 lb (102.059 kg)  08/01/14 234 lb (106.142 kg)  07/25/14 250 lb (113.399 kg)  07/19/14 233 lb (105.688 kg)  07/18/14 233 lb (105.688 kg)    Usual Body Weight: 230 lb   % Usual Body Weight: 106%  BMI:  Body mass index is 34.92 kg/(m^2).  Estimated Nutritional Needs: Kcal: 2141 Protein: >/= 150 grams Fluid: >1.5 L/day  Skin: laceration on head  Diet Order: NPO  EDUCATION NEEDS: -No education needs identified at this time   Intake/Output Summary (Last 24 hours) at 10/03/14 1222 Last data filed at 10/03/14 1211  Gross per 24 hour  Intake 2685.53 ml  Output   1073 ml  Net 1612.53 ml    Last BM: PTA   Labs:   Recent Labs Lab 10/02/14 1734  10/02/14 2130  10/03/14 0400 10/03/14 0425 10/03/14 0500 10/03/14 0836  NA 135*  < > 133*  < > 135* 134*  --  133*  K 6.9*  < > 6.8*  < > 4.7 4.7  --  4.5  CL 106  < > 103  < > 103 102  --  111  CO2 19  --  18*  --  17* 17*  --   --   BUN 41*  < > 42*  < > 35* 36*  --  29*  CREATININE 1.38*  < > 1.30  < >  1.21 1.24  --  1.20  CALCIUM 7.5*  --  7.7*  --  7.7* 7.8*  --   --   MG 2.2  --   --   --   --   --  2.2  --   PHOS  --   --   --   --   --  2.7  --   --   GLUCOSE 183*  < > 354*  < > 235* 235*  --  149*  < > = values in this interval not displayed.  CBG (last 3)   Recent Labs  10/03/14 0517 10/03/14 0620 10/03/14 0725  GLUCAP 197* 172* 160*    Scheduled Meds: . ampicillin-sulbactam (UNASYN) IV  1.5 g Intravenous Q6H  . antiseptic oral rinse  7 mL Mouth Rinse QID  . artificial tears  1 application Both Eyes 3 times per day  . chlorhexidine  15 mL Mouth Rinse BID  . fentaNYL  100 mcg Intravenous Once  . pantoprazole (PROTONIX) IV  40 mg Intravenous Q24H    Continuous Infusions: . sodium chloride 10 mL/hr (10/03/14 0800)  . cisatracurium (NIMBEX) infusion 1 mcg/kg/min (10/03/14 0800)  . fentaNYL infusion  INTRAVENOUS 100 mcg/hr (10/03/14 0800)  . insulin (NOVOLIN-R) infusion 1.3 Units/hr (10/03/14 1136)  . midazolam (VERSED) infusion 2 mg/hr (10/03/14 0800)  . norepinephrine (LEVOPHED) Adult infusion 50 mcg/min (10/03/14 1211)  . dialysis replacement fluid (prismasate) 200 mL/hr at 10/03/14 [id number]  . dialysis replacement fluid (prismasate) 300 mL/hr at 10/03/14 [id number]  . dialysate (PRISMASATE) 2,000 mL/hr at 10/03/14 1057  . vasopressin (PITRESSIN) infusion - *FOR SHOCK* 0.03 Units/min (10/03/14 0800)    Past Medical History  Diagnosis Date  . Heart murmur     ECHO 07/2011 showed mild AS and LVH.  TEE 09/2011 showed small PFO, bicuspid aortic valve but no stenosis or regurg.  [name]. 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  Gen surg and GI recommended flagyl and cipro x 2 wks..  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  . Cholelithiasis     u/s--no cholecystitis.  06/2014 u/s showed no stones, only GB sludge  . Peripheral neuropathy     Diabetic:  Autonomic (pelvic) and LE polyneuropathy--[facility] testing showed that it is likely from DM  . Diabetic foot ulcers     Poor healing; normal ABIs and TBIs  . Arthritis     back L3 - L5  . Hyperactive gag reflex   . History of pyelonephritis   . Type II or unspecified type diabetes mellitus with neurological manifestations, not stated as uncontrolled     IDDM: neuro, renal, and ophth complications  . Wears dentures     upper  . Osteomyelitis of toe of left foot     left 4th and 5th toes--now s/p amputation of these areas  . Diabetic retinopathy associated with type 2 diabetes mellitus     Laser tx in both eyes (Dr. [name])  . Diabetic nephropathy     Proteinuria and CrCl 55-65  . Chronic renal insufficiency, stage III (moderate)   . Lumbar spondylosis     MRI 08/25/11 with multilevel facet dz,  small disc protrusion at L5-S1 to the right without impingement  . Ischemic cardiomyopathy 07/22/14    Myoview w/out ischemia but large scar (inferior/apex) w/ global hypokinesis--medical mgmt  . History  of pancreatitis 2014 and 2015    Suspected biliary: passing small gallstones: surgery declined to remove GB 07/2014.  . Aspiration pneumonia   . Thrombocytopenia   . Portal hypertension   . Liver cirrhosis secondary to NASH     ?+ alcohol?.  With hx of splenomegaly and ascities (dx approx 2012)  . History of gram negative sepsis 2015    e coli    Past Surgical History  Procedure Laterality Date  . Knee arthroscopy  2001    Bilateral  . Amputation  10/24/2012    Procedure: AMPUTATION RAY;  Surgeon: Sherri Rad, MD;  Location: [facility];  Service: Orthopedics;  Laterality: Left;  left 4th toe amputation through MTP joint, 5th Ray amputation   . Knee surgery  2001  . Hida scan  06/2014    Normal  . Transthoracic echocardiogram  07/19/14    Mild LV dilation and hypertrophy, EF 35-40%, no wall motion abnormalities, grade II diast dysfxn, bicuspid aortic valve with mild AS and mild AI.  [name] Cardiovascular stress test  07/2014    Myoview w/out ischemia but large scar (inferior/apex) w/ global hypokinesis--medical mgmt  . Eye surgery  08/2014    Retinal hemorrhage evacuation    Kendell Bane RD, LDN, CNSC [phone] Pager [phone] After Hours Pager

## 2014-10-03 NOTE — Progress Notes (Signed)
Dr. Patty Sermons came to the bedside to evaluate heart rhythm.  Rhythm varies from a SB to a Junctional rhythm with bigemeny and frequent idioventricular beats.  No new orders received at this time.

## 2014-10-03 NOTE — Progress Notes (Signed)
ANTIBIOTIC CONSULT NOTE - INITIAL  Pharmacy Consult for Unasyn Indication: rule out sepsis  Allergies  Allergen Reactions  . Hydralazine Hcl Hives  . Naproxen Nausea And Vomiting  . Other Itching and Other (See Comments)    PERFUMES - SNEEZING    Patient Measurements: Height: 5\' 10"  (177.8 cm) Weight: 243 lb 6.2 oz (110.4 kg) IBW/kg (Calculated) : 73  Vital Signs: Temp: 91.4 F (33 C) (10/16 1300) Temp Source: Core (Comment) (10/16 1300) BP: 95/42 mmHg (10/16 1105) Pulse Rate: 39 (10/16 1315) Intake/Output from previous day: 10/15 0701 - 10/16 0700 In: 2124.5 [I.V.:1514.5; IV Piggyback:610] Out: 908 [Urine:160] Intake/Output from this shift: Total I/O In: 645.1 [I.V.:595.1; IV Piggyback:50] Out: 173 [Urine:31; Other:142]  Labs:  Recent Labs  10/02/14 1530  10/02/14 1734  10/03/14 0034 10/03/14 0400 10/03/14 0425 10/03/14 0500 10/03/14 0836  WBC 1.7*  --  12.2*  --   --   --   --  12.7*  --   HGB 2.1*  < > 10.3*  < > 10.2*  --   --  10.5* 10.2*  PLT PLATELET CLUMPS NOTED ON SMEAR, UNABLE TO ESTIMATE  --  36*  --   --   --   --  31*  --   CREATININE 0.24*  < > 1.38*  < > 1.20 1.21 1.24  --  1.20  < > = values in this interval not displayed. Estimated Creatinine Clearance: 89.6 ml/min (by C-G formula based on Cr of 1.2). No results found for this basename: VANCOTROUGH, Leodis BinetVANCOPEAK, VANCORANDOM, GENTTROUGH, GENTPEAK, GENTRANDOM, TOBRATROUGH, TOBRAPEAK, TOBRARND, AMIKACINPEAK, AMIKACINTROU, AMIKACIN,  in the last 72 hours   Microbiology: Recent Results (from the past 720 hour(s))  MRSA PCR SCREENING     Status: None   Collection Time    10/02/14  4:46 PM      Result Value Ref Range Status   MRSA by PCR NEGATIVE  NEGATIVE Final   Comment:            The GeneXpert MRSA Assay (FDA     approved for NASAL specimens     only), is one component of a     comprehensive MRSA colonization     surveillance program. It is not     intended to diagnose MRSA     infection  nor to guide or     monitor treatment for     MRSA infections.    Medical History: Past Medical History  Diagnosis Date  . Heart murmur     ECHO 07/2011 showed mild AS and LVH.  TEE 09/2011 showed small PFO, bicuspid aortic valve but no stenosis or regurg.  [name]. 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  Gen surg and GI recommended flagyl and cipro x 2 wks..  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  . Cholelithiasis     u/s--no cholecystitis.  06/2014 u/s showed no stones, only GB sludge  . Peripheral neuropathy     Diabetic:  Autonomic (pelvic) and LE polyneuropathy--[facility] testing showed that it is likely from DM  . Diabetic foot ulcers     Poor healing; normal ABIs and TBIs  . Arthritis     back L3 - L5  . Hyperactive gag reflex   . History of pyelonephritis   . Type II or unspecified type diabetes mellitus with neurological manifestations,  not stated as uncontrolled     IDDM: neuro, renal, and ophth complications  . Wears dentures     upper  . Osteomyelitis of toe of left foot     left 4th and 5th toes--now s/p amputation of these areas  . Diabetic retinopathy associated with type 2 diabetes mellitus     Laser tx in both eyes (Dr. Allyne Gee)  . Diabetic nephropathy     Proteinuria and CrCl 55-65  . Chronic renal insufficiency, stage III (moderate)   . Lumbar spondylosis     MRI 08/25/11 with multilevel facet dz, small disc protrusion at L5-S1 to the right without impingement  . Ischemic cardiomyopathy 07/22/14    Myoview w/out ischemia but large scar (inferior/apex) w/ global hypokinesis--medical mgmt  . History of pancreatitis 2014 and 2015    Suspected biliary: passing small gallstones: surgery declined to remove GB 07/2014.  . Aspiration pneumonia   . Thrombocytopenia   . Portal hypertension   . Liver cirrhosis secondary  to NASH     ?+ alcohol?.  With hx of splenomegaly and ascities (dx approx 2012)  . History of gram negative sepsis 2015    e coli    Medications:  Prescriptions prior to admission  Medication Sig Dispense Refill  . carvedilol (COREG) 3.125 MG tablet Take 1 tablet (3.125 mg total) by mouth 2 (two) times daily with a meal.  30 tablet  3  . furosemide (LASIX) 40 MG tablet Take 40 mg by mouth 2 (two) times daily.      [name] glucagon 1 MG injection Inject 1 mg into the vein once as needed. For hypoglycemia  1 each  2  . insulin aspart (NOVOLOG) 100 UNIT/ML injection Inject 10 Units into the skin 3 (three) times daily before meals.  10 mL  3  . insulin glargine (LANTUS) 100 UNIT/ML injection Inject 0.38 mLs (38 Units total) into the skin at bedtime.  10 mL  2  . lisinopril (PRINIVIL,ZESTRIL) 10 MG tablet Take 1 tablet (10 mg total) by mouth daily.  30 tablet  0  . pantoprazole (PROTONIX) 40 MG tablet Take 1 tablet (40 mg total) by mouth daily.  30 tablet  3  . promethazine (PHENERGAN) 25 MG tablet Take 1 tablet (25 mg total) by mouth every 6 (six) hours as needed for nausea or vomiting.  30 tablet  1  . ranitidine (ZANTAC) 150 MG tablet Take 1 tablet (150 mg total) by mouth at bedtime.  30 tablet  3  . spironolactone (ALDACTONE) 100 MG tablet Take 1 tablet (100 mg total) by mouth daily.  30 tablet  6   Assessment: 53 yo M admitted 10/02/2014 after collapse at work, [employer], [employer]. Pharmacy consulted to dose unasyn  PMH: HF EF 35-40%, Cirrhosis (NASH, portal hypertension,off EtOH x 2y), DM (peripheral neuropathy, h/o osteo), cholelithiasis  Events: oliguric renal failure, CVV started due to hyperkalemia  Coag/Heme: TCP plts 31  ID: hypothermic WBC 12.7 (stable), increased secretions, difficulty maintaining BP > starting Unasyn for r/o sepsis 10/16 unasyn>>  Goal of Therapy:  Renal adjustment of antibiotics  Plan:  Unasyn 1.5 g IV q6h Follow up SCr, UOP, cultures, clinical course  and adjust as clinically indicated.   Thank you for allowing pharmacy to be a part of this patients care team.  Lovenia Kim Pharm.D., BCPS, AQ-Cardiology Clinical Pharmacist 10/03/2014 1:32 PM Pager: [phone] Phone: [phone]

## 2014-10-04 ENCOUNTER — Inpatient Hospital Stay (HOSPITAL_COMMUNITY): Payer: 59

## 2014-10-04 DIAGNOSIS — I469 Cardiac arrest, cause unspecified: Secondary | ICD-10-CM

## 2014-10-04 DIAGNOSIS — I429 Cardiomyopathy, unspecified: Secondary | ICD-10-CM

## 2014-10-04 DIAGNOSIS — K7469 Other cirrhosis of liver: Secondary | ICD-10-CM

## 2014-10-04 DIAGNOSIS — R7989 Other specified abnormal findings of blood chemistry: Secondary | ICD-10-CM

## 2014-10-04 DIAGNOSIS — D696 Thrombocytopenia, unspecified: Secondary | ICD-10-CM

## 2014-10-04 DIAGNOSIS — N189 Chronic kidney disease, unspecified: Secondary | ICD-10-CM

## 2014-10-04 DIAGNOSIS — R188 Other ascites: Secondary | ICD-10-CM

## 2014-10-04 LAB — ABO/RH: ABO/RH(D): B POS

## 2014-10-04 LAB — POCT I-STAT, CHEM 8
BUN: 27 mg/dL — AB (ref 6–23)
CALCIUM ION: 0.98 mmol/L — AB (ref 1.12–1.23)
CHLORIDE: 115 meq/L — AB (ref 96–112)
CREATININE: 1.3 mg/dL (ref 0.50–1.35)
GLUCOSE: 140 mg/dL — AB (ref 70–99)
HCT: 26 % — ABNORMAL LOW (ref 39.0–52.0)
Hemoglobin: 8.8 g/dL — ABNORMAL LOW (ref 13.0–17.0)
Potassium: 4.9 mEq/L (ref 3.7–5.3)
Sodium: 130 mEq/L — ABNORMAL LOW (ref 137–147)
TCO2: 21 mmol/L (ref 0–100)

## 2014-10-04 LAB — CBC
HEMATOCRIT: 22.1 % — AB (ref 39.0–52.0)
HEMOGLOBIN: 7.9 g/dL — AB (ref 13.0–17.0)
MCH: 30.4 pg (ref 26.0–34.0)
MCHC: 35.7 g/dL (ref 30.0–36.0)
MCV: 85 fL (ref 78.0–100.0)
Platelets: 21 10*3/uL — CL (ref 150–400)
RBC: 2.6 MIL/uL — ABNORMAL LOW (ref 4.22–5.81)
RDW: 15.5 % (ref 11.5–15.5)
WBC: 5.4 10*3/uL (ref 4.0–10.5)

## 2014-10-04 LAB — RENAL FUNCTION PANEL
ALBUMIN: 2.4 g/dL — AB (ref 3.5–5.2)
Albumin: 2.4 g/dL — ABNORMAL LOW (ref 3.5–5.2)
Anion gap: 11 (ref 5–15)
Anion gap: 13 (ref 5–15)
BUN: 24 mg/dL — AB (ref 6–23)
BUN: 26 mg/dL — AB (ref 6–23)
CALCIUM: 7 mg/dL — AB (ref 8.4–10.5)
CO2: 20 mEq/L (ref 19–32)
CO2: 19 mEq/L (ref 19–32)
Calcium: 6.8 mg/dL — ABNORMAL LOW (ref 8.4–10.5)
Chloride: 104 mEq/L (ref 96–112)
Chloride: 106 mEq/L (ref 96–112)
Creatinine, Ser: 1.11 mg/dL (ref 0.50–1.35)
Creatinine, Ser: 1.68 mg/dL — ABNORMAL HIGH (ref 0.50–1.35)
GFR calc Af Amer: 87 mL/min — ABNORMAL LOW (ref >90)
GFR calc non Af Amer: 75 mL/min — ABNORMAL LOW (ref >90)
GFR calc non Af Amer: 45 mL/min — ABNORMAL LOW (ref >90)
GFR, EST AFRICAN AMERICAN: 52 mL/min — AB (ref >90)
GLUCOSE: 168 mg/dL — AB (ref 70–99)
Glucose, Bld: 202 mg/dL — ABNORMAL HIGH (ref 70–99)
PHOSPHORUS: 3.1 mg/dL (ref 2.3–4.6)
PHOSPHORUS: 4.1 mg/dL (ref 2.3–4.6)
Potassium: 5.3 mEq/L (ref 3.7–5.3)
Potassium: 5.5 mEq/L — ABNORMAL HIGH (ref 3.7–5.3)
SODIUM: 138 meq/L (ref 137–147)
Sodium: 135 mEq/L — ABNORMAL LOW (ref 137–147)

## 2014-10-04 LAB — BASIC METABOLIC PANEL
ANION GAP: 11 (ref 5–15)
Anion gap: 11 (ref 5–15)
Anion gap: 11 (ref 5–15)
BUN: 24 mg/dL — AB (ref 6–23)
BUN: 24 mg/dL — ABNORMAL HIGH (ref 6–23)
BUN: 26 mg/dL — AB (ref 6–23)
CO2: 19 meq/L (ref 19–32)
CO2: 20 mEq/L (ref 19–32)
CO2: 20 meq/L (ref 19–32)
CREATININE: 1.08 mg/dL (ref 0.50–1.35)
CREATININE: 1.48 mg/dL — AB (ref 0.50–1.35)
Calcium: 6.6 mg/dL — ABNORMAL LOW (ref 8.4–10.5)
Calcium: 6.7 mg/dL — ABNORMAL LOW (ref 8.4–10.5)
Calcium: 7 mg/dL — ABNORMAL LOW (ref 8.4–10.5)
Chloride: 102 mEq/L (ref 96–112)
Chloride: 103 mEq/L (ref 96–112)
Chloride: 104 mEq/L (ref 96–112)
Creatinine, Ser: 1.17 mg/dL (ref 0.50–1.35)
GFR calc Af Amer: 61 mL/min — ABNORMAL LOW (ref >90)
GFR calc Af Amer: 89 mL/min — ABNORMAL LOW (ref >90)
GFR calc non Af Amer: 53 mL/min — ABNORMAL LOW (ref >90)
GFR calc non Af Amer: 77 mL/min — ABNORMAL LOW (ref >90)
GFR, EST AFRICAN AMERICAN: 81 mL/min — AB (ref >90)
GFR, EST NON AFRICAN AMERICAN: 70 mL/min — AB (ref >90)
Glucose, Bld: 157 mg/dL — ABNORMAL HIGH (ref 70–99)
Glucose, Bld: 173 mg/dL — ABNORMAL HIGH (ref 70–99)
Glucose, Bld: 201 mg/dL — ABNORMAL HIGH (ref 70–99)
Potassium: 5.2 mEq/L (ref 3.7–5.3)
Potassium: 5.5 mEq/L — ABNORMAL HIGH (ref 3.7–5.3)
Potassium: 5.6 mEq/L — ABNORMAL HIGH (ref 3.7–5.3)
SODIUM: 134 meq/L — AB (ref 137–147)
Sodium: 133 mEq/L — ABNORMAL LOW (ref 137–147)
Sodium: 134 mEq/L — ABNORMAL LOW (ref 137–147)

## 2014-10-04 LAB — GLUCOSE, CAPILLARY
GLUCOSE-CAPILLARY: 186 mg/dL — AB (ref 70–99)
GLUCOSE-CAPILLARY: 168 mg/dL — AB (ref 70–99)
Glucose-Capillary: 164 mg/dL — ABNORMAL HIGH (ref 70–99)
Glucose-Capillary: 164 mg/dL — ABNORMAL HIGH (ref 70–99)
Glucose-Capillary: 201 mg/dL — ABNORMAL HIGH (ref 70–99)
Glucose-Capillary: 186 mg/dL — ABNORMAL HIGH (ref 70–99)

## 2014-10-04 LAB — CBC WITH DIFFERENTIAL/PLATELET
BASOS ABS: 0 10*3/uL (ref 0.0–0.1)
Basophils Relative: 0 % (ref 0–1)
EOS PCT: 1 % (ref 0–5)
Eosinophils Absolute: 0 10*3/uL (ref 0.0–0.7)
HCT: 22.6 % — ABNORMAL LOW (ref 39.0–52.0)
Hemoglobin: 7.7 g/dL — ABNORMAL LOW (ref 13.0–17.0)
LYMPHS ABS: 0.4 10*3/uL — AB (ref 0.7–4.0)
LYMPHS PCT: 7 % — AB (ref 12–46)
MCH: 30.1 pg (ref 26.0–34.0)
MCHC: 34.1 g/dL (ref 30.0–36.0)
MCV: 88.3 fL (ref 78.0–100.0)
Monocytes Absolute: 0.6 10*3/uL (ref 0.1–1.0)
Monocytes Relative: 11 % (ref 3–12)
Neutro Abs: 4.5 10*3/uL (ref 1.7–7.7)
Neutrophils Relative %: 81 % — ABNORMAL HIGH (ref 43–77)
PLATELETS: 26 10*3/uL — AB (ref 150–400)
RBC: 2.56 MIL/uL — AB (ref 4.22–5.81)
RDW: 15.3 % (ref 11.5–15.5)
WBC: 5.5 10*3/uL (ref 4.0–10.5)

## 2014-10-04 LAB — APTT: aPTT: 31 seconds (ref 24–37)

## 2014-10-04 LAB — PREPARE RBC (CROSSMATCH)

## 2014-10-04 LAB — MAGNESIUM: Magnesium: 2 mg/dL (ref 1.5–2.5)

## 2014-10-04 MED ORDER — VITAL HIGH PROTEIN PO LIQD
1000.0000 mL | ORAL | Status: DC
  Administered 2014-10-04: 22:00:00
  Administered 2014-10-04 – 2014-10-05 (×2): 1000 mL
  Filled 2014-10-04 (×4): qty 1000

## 2014-10-04 MED ORDER — SODIUM CHLORIDE 0.9 % IV BOLUS (SEPSIS)
1000.0000 mL | Freq: Once | INTRAVENOUS | Status: AC
  Administered 2014-10-04: 1000 mL via INTRAVENOUS

## 2014-10-04 MED ORDER — SODIUM CHLORIDE 0.9 % IV SOLN
0.0000 ug/h | INTRAVENOUS | Status: DC
  Administered 2014-10-04: 100 ug/h via INTRAVENOUS
  Administered 2014-10-05 – 2014-10-06 (×3): 50 ug/h via INTRAVENOUS
  Filled 2014-10-04 (×2): qty 50

## 2014-10-04 MED ORDER — FENTANYL BOLUS VIA INFUSION
50.0000 ug | INTRAVENOUS | Status: DC | PRN
  Filled 2014-10-04: qty 100

## 2014-10-04 MED ORDER — INSULIN GLARGINE 100 UNIT/ML ~~LOC~~ SOLN
10.0000 [IU] | Freq: Every day | SUBCUTANEOUS | Status: DC
  Administered 2014-10-05 – 2014-10-09 (×5): 10 [IU] via SUBCUTANEOUS
  Filled 2014-10-04 (×9): qty 0.1

## 2014-10-04 MED ORDER — PRO-STAT SUGAR FREE PO LIQD
30.0000 mL | Freq: Every day | ORAL | Status: DC
  Administered 2014-10-04 – 2014-10-06 (×10): 30 mL
  Filled 2014-10-04 (×15): qty 30

## 2014-10-04 MED ORDER — MIDAZOLAM BOLUS VIA INFUSION
1.0000 mg | INTRAVENOUS | Status: DC | PRN
  Filled 2014-10-04: qty 2

## 2014-10-04 MED ORDER — SODIUM CHLORIDE 0.9 % IV SOLN
0.0000 mg/h | INTRAVENOUS | Status: DC
  Administered 2014-10-04: 2 mg/h via INTRAVENOUS
  Filled 2014-10-04: qty 10

## 2014-10-04 MED ORDER — SODIUM CHLORIDE 0.9 % IV SOLN
Freq: Once | INTRAVENOUS | Status: AC
  Administered 2014-10-04: 08:00:00 via INTRAVENOUS

## 2014-10-04 MED ORDER — SENNOSIDES-DOCUSATE SODIUM 8.6-50 MG PO TABS: 1.0000 | ORAL_TABLET | Freq: Two times a day (BID) | ORAL | Status: DC | PRN

## 2014-10-04 MED ORDER — SODIUM CHLORIDE 0.9 % IV SOLN
1.0000 g | Freq: Once | INTRAVENOUS | Status: AC
  Administered 2014-10-04: 1 g via INTRAVENOUS
  Filled 2014-10-04: qty 10

## 2014-10-04 MED ORDER — FENTANYL CITRATE 0.05 MG/ML IJ SOLN: 50.0000 ug | Freq: Once | INTRAMUSCULAR | Status: DC

## 2014-10-04 NOTE — Progress Notes (Signed)
Nutrition Note  -Received consult for TF initiation -Initial Assessment completed with TF recommendations on 10/16 -Continue to recommended: Vital High Protein at goal rate of 40 ml/hr via OGT with Pro-Stat 30 ml five times/day to provide 1460 kcal (68% of needs), 159 grams of protein, and 802 ml of H2O.  RD to continue to monitor. Please consult as needed.  Lloyd Huger MS RD LDN Clinical Dietitian Pager:[phone]

## 2014-10-04 NOTE — Progress Notes (Signed)
Notified Elink regarding lab results. Awaiting MD instructions. Will continue to monitor. c Kolbee Bogusz RN

## 2014-10-04 NOTE — Progress Notes (Signed)
Patient ID: [name], male   DOB: [birth year], 53 y.o.   MRN: [id number] S: intubated, sedated  O:BP 122/46  Pulse 84  Temp(Src) 97.5 F (36.4 C) (Core (Comment))  Resp 15  Ht 5\' 10"  (1.778 m)  Wt 246 lb 4.1 oz (111.7 kg)  BMI 35.33 kg/m2  SpO2 100%  Intake/Output Summary (Last 24 hours) at 10/04/14 0917 Last data filed at 10/04/14 0800  Gross per 24 hour  Intake 7040.93 ml  Output     91 ml  Net 6949.93 ml   Intake/Output: I/O last 3 completed shifts: In: 8442 [I.V.:3382; IV Piggyback:5060] Out: 1010 [Urine:192; Other:818]  Intake/Output this shift:  Total I/O In: 840.2 [I.V.:90.2; IV Piggyback:750] Out: 13 [Urine:15] Weight change: 18 lb 4.8 oz (8.3 kg)  Gen: NAD Cardio: RRR, no m/r/g Resp: CTAB, on vent Abd: soft, no bowel sounds Ext: no edema, cool feet. Left foot 4th/5th digit amputation   Recent Labs Lab 10/02/14 1734  10/03/14 0400 10/03/14 0425 10/03/14 0836 10/03/14 1132 10/03/14 1630 10/03/14 2000 10/03/14 2330 10/04/14 0345 10/04/14 0800  NA 135*  < > 135* 134* 133* 130* 135* 135* 134* 135* 134*  K 6.9*  < > 4.7 4.7 4.5 4.9 5.7* 5.7* 5.5* 5.3 5.2  CL 106  < > 103 102 111 115* 103 104 104 104 103  CO2 19  < > 17* 17*  --   --  20 20 19 20 20   GLUCOSE 183*  < > 235* 235* 149* 140* 160* 168* 157* 168* 173*  BUN 41*  < > 35* 36* 29* 27* 29* 27* 26* 24* 24*  CREATININE 1.38*  < > 1.21 1.24 1.20 1.30 1.20 1.13 1.08 1.11 1.17  ALBUMIN 3.1*  --   --  3.1*  --   --  2.7*  --   --  2.4*  --   CALCIUM 7.5*  < > 7.7* 7.8*  --   --  7.2* 6.8* 6.7* 6.8* 6.6*  PHOS  --   --   --  2.7  --   --  3.0  --   --  3.1  --   AST 44*  --   --   --   --   --   --   --   --   --   --   ALT 35  --   --   --   --   --   --   --   --   --   --   < > = values in this interval not displayed. Liver Function Tests:  Recent Labs Lab 10/02/14 1734 10/03/14 0425 10/03/14 1630 10/04/14 0345  AST 44*  --   --   --   ALT 35  --   --   --   ALKPHOS 117  --   --   --    BILITOT 0.8  --   --   --   PROT 5.8*  --   --   --   ALBUMIN 3.1* 3.1* 2.7* 2.4*   No results found for this basename: LIPASE, AMYLASE,  in the last 168 hours No results found for this basename: AMMONIA,  in the last 168 hours CBC:  Recent Labs Lab 10/02/14 1530  10/02/14 1734  10/03/14 0500 10/03/14 0836 10/03/14 1132 10/04/14 0345  WBC 1.7*  --  12.2*  --  12.7*  --   --  5.4  NEUTROABS  --   --  10.7*  --   --   --   --   --   HGB 2.1*  < > 10.3*  < > 10.5* 10.2* 8.8* 7.9*  HCT 9.5*  < > 30.0*  < > 30.0* 30.0* 26.0* 22.1*  MCV 133.8*  --  89.0  --  85.5  --   --  85.0  PLT PLATELET CLUMPS NOTED ON SMEAR, UNABLE TO ESTIMATE  --  36*  --  31*  --   --  21*  < > = values in this interval not displayed. Cardiac Enzymes:  Recent Labs Lab 10/02/14 2200 10/03/14 0400  TROPONINI 1.86* 1.72*   CBG:  Recent Labs Lab 10/03/14 1632 10/03/14 1935 10/03/14 2343 10/04/14 0335 10/04/14 0755  GLUCAP 151* 145* 154* 164* 164*    Iron Studies: No results found for this basename: IRON, TIBC, TRANSFERRIN, FERRITIN,  in the last 72 hours Studies/Results:  BMET    Component Value Date/Time   NA 134* 10/04/2014 0800   K 5.2 10/04/2014 0800   CL 103 10/04/2014 0800   CO2 20 10/04/2014 0800   GLUCOSE 173* 10/04/2014 0800   BUN 24* 10/04/2014 0800   CREATININE 1.17 10/04/2014 0800   CREATININE 1.87* 07/18/2014 1647   CALCIUM 6.6* 10/04/2014 0800   GFRNONAA 70* 10/04/2014 0800   GFRAA 81* 10/04/2014 0800   CBC    Component Value Date/Time   WBC 5.4 10/04/2014 0345   RBC 2.60* 10/04/2014 0345   HGB 7.9* 10/04/2014 0345   HCT 22.1* 10/04/2014 0345   PLT 21* 10/04/2014 0345   MCV 85.0 10/04/2014 0345   MCH 30.4 10/04/2014 0345   MCHC 35.7 10/04/2014 0345   RDW 15.5 10/04/2014 0345   LYMPHSABS 0.5* 10/02/2014 1734   MONOABS 1.0 10/02/2014 1734   EOSABS 0.1 10/02/2014 1734   BASOSABS 0.0 10/02/2014 1734    Assessment/Plan:  1. ARF, oliguric- in setting of cardiac  arrest while on lisinopril. Started on CVVHD 10/15, tolerating well. 2. Hyperkalemia- related to cardiac arrest in setting of ACEi and aldactone as well as metabolic acidosis and hyperglycemia.  Improved with CVVHD, repeat bmet at 16:00 scheduled 3. Anemia- normocytic.  Receiving transfusion today 4. Cardiac arrest- on amio and cardiology following 5. VDRF- per PCCM 6. SIRS- hypovolemic- will keep + with IVF's, cont pressors 7. DM- per PCCM 8. Cirrhosis 9. Thrombocytopenia  [name] 10/04/2014, 9:19 AM  I have seen and examined this patient and agree with plan as outlined by Dr. Waynetta Sandy.  Pt still requiring significant pressor support and remains oliguric.  Doing well from volume status and will cont with CVVHD at current prescription for now.  Continue to follow electrolytes now that warming process is complete.Will also need to replete calcium with IV calcium gluconate.  No heparin with CVVHD due to thrombocytopenia. [name] A,MD 10/04/2014 11:31 AM

## 2014-10-04 NOTE — Progress Notes (Signed)
Chaplain Note: Provided emotional support and ministry of presence  Rutherford Nail, [address]

## 2014-10-04 NOTE — Progress Notes (Signed)
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 Pt suddenly collapsed, fell backward and hit his head.  AED advised shockable rhythm. Approx 10 minutes CPR with ROSC before EMS arrival.  PCCM called to admit.  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 with splenomegaly, ascites and thrombocytopenia, gallstone pancreatitis,Type II DM with diabetic nephropathy, neuropathy and retinopathy ,07/2014  hospitalized for e coli  sepsis     STUDIES:  CT head 10/15>>>neg, Small air-fluid level in the left maxillary sinus and right sphenoid sinus    SIGNIFICANT EVENTS: 10/15 hyperkalemia (on aldactone & lisinopril) requiring CRRT   10/03/14 : : Cooled to 33  Bradycardic , on max pressors Anuric Sedated , paralysed On CRRT   10/04/14 On CRRT for high K., on pressors., Just rewarmed: following all commands  VITAL SIGNS: Temp:  [89.2 F (31.8 C)-96.3 F (35.7 C)] 96.3 F (35.7 C) (10/17 0600) Pulse Rate:  [33-69] 69 (10/17 0600) Resp:  [0-37] 0 (10/17 0600) BP: (95-196)/(27-61) 113/38 mmHg (10/17 0600) SpO2:  [80 %-100 %] 99 % (10/17 0600) Arterial Line BP: (94-191)/(35-76) 126/49 mmHg (10/17 0600) FiO2 (%):  [40 %-50 %] 40 % (10/17 0746) Weight:  [111.7 kg (246 lb 4.1 oz)] 111.7 kg (246 lb 4.1 oz) (10/17 0550) HEMODYNAMICS: CVP:  [0 mmHg-6 mmHg] 3 mmHg VENTILATOR SETTINGS: Vent Mode:  [-] PRVC FiO2 (%):  [40 %-50 %] 40 % Set Rate:  [18 bmp] 18 bmp Vt Set:  [500 mL] 500 mL PEEP:  [5 cmH20] 5 cmH20 Plateau Pressure:  [18 cmH20-20 cmH20] 18 cmH20 INTAKE /  OUTPUT:  Intake/Output Summary (Last 24 hours) at 10/04/14 0809 Last data filed at 10/04/14 0700  Gross per 24 hour  Intake 6308.95 ml  Output    137 ml  Net 6171.95 ml    PHYSICAL EXAMINATION: General:  Chronically an crtically ill appearing male, acutely ill  Neuro: On fent and versed gtt. Off nimbex, -> RASS -1, Follows all commands. Moves all 4s HEENT:  Mm dry, no JVD Cardiovascular:  s1s2 tachy, distant Lungs:  resps even non labored on vent, good air entry BL, bloody secretions  Abdomen:  Obese, round, soft, hypoactive bs Musculoskeletal:  Pale, cool and dry, scant BLE edema   LABS:  PULMONARY  Recent Labs Lab 10/02/14 1717  10/02/14 2156 10/02/14 2258 10/03/14 0034 10/03/14 0836 10/03/14 1132  PHART 7.238*  --  7.259*  --   --   --   --   PCO2ART 48.3*  --  40.0  --   --   --   --   PO2ART 207.0*  --  282.0*  --   --   --   --   HCO3 21.4  --  17.3*  --   --   --   --   TCO2 23  < > 18.5 19 18 20 21   O2SAT 100.0  --  100.0  --   --   --   --   < > = values in this interval not displayed.  CBC  Recent Labs Lab 10/02/14 1734  10/03/14  0500 10/03/14 0836 10/03/14 1132 10/04/14 0345  HGB 10.3*  < > 10.5* 10.2* 8.8* 7.9*  HCT 30.0*  < > 30.0* 30.0* 26.0* 22.1*  WBC 12.2*  --  12.7*  --   --  5.4  PLT 36*  --  31*  --   --  21*  < > = values in this interval not displayed.  COAGULATION  Recent Labs Lab 10/02/14 1700 10/02/14 2344  INR 1.30 1.33    CARDIAC   Recent Labs Lab 10/02/14 2200 10/03/14 0400  TROPONINI 1.86* 1.72*   No results found for this basename: PROBNP,  in the last 168 hours   CHEMISTRY  Recent Labs Lab 10/02/14 1734  10/03/14 0425 10/03/14 0500  10/03/14 1132 10/03/14 1630 10/03/14 2000 10/03/14 2330 10/04/14 0345  NA 135*  < > 134*  --   < > 130* 135* 135* 134* 135*  K 6.9*  < > 4.7  --   < > 4.9 5.7* 5.7* 5.5* 5.3  CL 106  < > 102  --   < > 115* 103 104 104 104  CO2 19  < > 17*  --   --   --  20 20 19 20    GLUCOSE 183*  < > 235*  --   < > 140* 160* 168* 157* 168*  BUN 41*  < > 36*  --   < > 27* 29* 27* 26* 24*  CREATININE 1.38*  < > 1.24  --   < > 1.30 1.20 1.13 1.08 1.11  CALCIUM 7.5*  < > 7.8*  --   --   --  7.2* 6.8* 6.7* 6.8*  MG 2.2  --   --  2.2  --   --   --   --   --  2.0  PHOS  --   --  2.7  --   --   --  3.0  --   --  3.1  < > = values in this interval not displayed. Estimated Creatinine Clearance: 97.4 ml/min (by C-G formula based on Cr of 1.11).   LIVER  Recent Labs Lab 10/02/14 1700 10/02/14 1734 10/02/14 2344 10/03/14 0425 10/03/14 1630 10/04/14 0345  AST  --  44*  --   --   --   --   ALT  --  35  --   --   --   --   ALKPHOS  --  117  --   --   --   --   BILITOT  --  0.8  --   --   --   --   PROT  --  5.8*  --   --   --   --   ALBUMIN  --  3.1*  --  3.1* 2.7* 2.4*  INR 1.30  --  1.33  --   --   --      INFECTIOUS No results found for this basename: LATICACIDVEN, PROCALCITON,  in the last 168 hours   ENDOCRINE CBG (last 3)   Recent Labs  10/03/14 1935 10/03/14 2343 10/04/14 0335  GLUCAP 145* 154* 164*         IMAGING x48h Ct Head Wo Contrast  10/02/2014   CLINICAL DATA:  53 year old male with history of trauma after after a fall during a syncopal episode during which the patient fell backward and struck his head. Status post CPR.  EXAM: CT HEAD WITHOUT CONTRAST  TECHNIQUE: Contiguous axial images were obtained from  the base of the skull through the vertex without intravenous contrast.  COMPARISON:  No priors.  FINDINGS: No acute displaced skull fractures are identified. No acute intracranial abnormality. Specifically, no evidence of acute post-traumatic intracranial hemorrhage, no definite regions of acute/subacute cerebral ischemia, no focal mass, mass effect, hydrocephalus or abnormal intra or extra-axial fluid collections. The visualized paranasal sinuses and mastoids are well pneumatized, with exception of a small air-fluid level in the posterior  aspect of the left maxillary sinus and right sphenoid sinus.  IMPRESSION: 1. Small amount of soft tissue swelling in the occipital scalp, without underlying displaced skull fracture or acute intracranial abnormalities. 2. Small air-fluid level in the left maxillary sinus and right sphenoid sinus. This is nonspecific, but correlation for signs of sinusitis is recommended.   Electronically Signed   By: Trudie Reedaniel  Entrikin M.D.   On: 10/02/2014 16:36   Dg Chest Port 1 View  10/04/2014   CLINICAL DATA:  Acute respiratory failure  EXAM: PORTABLE CHEST - 1 VIEW  COMPARISON:  Radiograph 10/03/2014  FINDINGS: Endotracheal tube, NG tube, right central venous line are unchanged. Stable cardiac silhouette. The is dense left basilar atelectasis unchanged. Mild right basilar airspace disease. No pneumothorax  IMPRESSION: 1. Stable support apparatus. 2. No change in dense left basilar atelectasis/airspace disease. 3. Mild airspace disease on the right. 4. Overall no interval change   Electronically Signed   By: Genevive BiStewart  Edmunds M.D.   On: 10/04/2014 07:22   Dg Chest Port 1 View  10/03/2014   CLINICAL DATA:  Respiratory failure/hypoxia  EXAM: PORTABLE CHEST - 1 VIEW  COMPARISON:  October 02, 2014  FINDINGS: Endotracheal tube tip is 2.7 cm above the carina. Nasogastric tube tip and side port are below the diaphragm. Central catheter tip is in the superior vena cava. The questionable trace pneumothorax on the left seen 1 day prior is not appreciable on this examination. No pneumothorax is apparent currently.  There is persistent consolidation in the left lower lobe. Hazy opacity throughout much of the right lung is slightly less apparent currently. No new opacity is seen. Heart is enlarged with pulmonary vascularity within normal limits. No adenopathy.  IMPRESSION: Tube and catheter positions as described without appreciable pneumothorax. Infiltrate bilaterally, more on the right than on the left, is again noted. There is  slightly less consolidation on the right and no appreciable change in the left lower lobe. No new opacity.   Electronically Signed   By: Bretta BangWilliam  Woodruff M.D.   On: 10/03/2014 07:56   Dg Chest Port 1 View  10/02/2014   CLINICAL DATA:  Unsuccessful attempted left-sided central line placement. Subsequent encounter.  EXAM: PORTABLE CHEST - 1 VIEW  COMPARISON:  10/02/2014 (multiple examinations)  FINDINGS: Grossly unchanged enlarged cardiac silhouette and mediastinal contours. Stable positioning of support apparatus. Query trace left apical pneumothorax. Pulmonary vasculature remains indistinct with cephalization of flow with relative area of confluence about the right mid lung. Grossly unchanged left basilar/retrocardiac consolidative opacities. Trace of sided effusion is not excluded. Unchanged bones.  IMPRESSION: 1.  Stable positioning of support apparatus. 2. Query trace left apical pneumothorax. Continued attention on follow-up is recommended. 3. Similar findings of asymmetric pulmonary edema worse within the right mid and left lower lung. Critical Value/emergent results were called by telephone at the time of interpretation on 10/02/2014 at 10:23 pm to [facility] [place] Limited PartnershipDavis, [place], who verbally acknowledged these results.   Electronically Signed   By: Simonne ComeJohn  Watts M.D.   On: 10/02/2014 22:24   Dg  Chest Portable 1 View  10/02/2014   CLINICAL DATA:  Readjustment of endotracheal tube.  EXAM: PORTABLE CHEST - 1 VIEW  COMPARISON:  Same day.  FINDINGS: Stable cardiomediastinal silhouette. Endotracheal tube is now seen with distal tip 3 cm above the carina in grossly good position. Right internal jugular catheter remains with distal tip overlying expected position of the SVC. Stable bilateral lung opacities are noted. No pneumothorax or pleural effusion is noted.  IMPRESSION: Stable bilateral lung opacities. Endotracheal tube in grossly good position.   Electronically Signed   By: Roque Lias M.D.   On: 10/02/2014 17:04   Dg  Chest Portable 1 View  10/02/2014   CLINICAL DATA:  Status post intubation and central line placement.  EXAM: PORTABLE CHEST - 1 VIEW  COMPARISON:  July 18, 2014.  FINDINGS: Right internal jugular catheter line is noted with distal tip in expected position of the SVC. Endotracheal tube is directed in the right mainstem bronchus. It is recommended at be withdrawn approximately 4 cm. Diffuse airspace opacities are noted in the right lung in left upper lobe, as well as large left retrocardiac opacity concerning for bilateral pneumonia. No pneumothorax or significant pleural effusion is noted. Bony thorax is intact.  IMPRESSION: Significantly increased bilateral lung opacities are noted concerning for pneumonia or possibly edema. Endotracheal tube is directed in the right mainstem bronchus; it is recommended to be withdrawn at least 4 cm. Critical Value/emergent results were called by telephone at the time of interpretation on 10/02/2014 at 4:15 pm to Dr. Zadie Rhine , who verbally acknowledged these results.   Electronically Signed   By: Roque Lias M.D.   On: 10/02/2014 16:15   Dg Abd Portable 1v  10/02/2014   CLINICAL DATA:  OG tube insertion  EXAM: PORTABLE ABDOMEN - 1 VIEW  COMPARISON:  None.  FINDINGS: Enteric tube tip and side port projects over the expected location of the gastric fundus.  Paucity of bowel gas without definite evidence of obstruction. No supine evidence of pneumoperitoneum. No definite pneumatosis or portal venous gas.  Limited visualization the lower thorax demonstrates enlargement of the cardiac silhouette and epicardial pacer pads overlying the left ventricular apex.  IMPRESSION: Enteric tube tip and side port overlie the gastric fundus.   Electronically Signed   By: Simonne Come M.D.   On: 10/02/2014 22:29        ASSESSMENT / PLAN:  PULMONARY OETT 10/15>>> Acute respiratory failure, post arrest Apical left pneumothx noted on CXR 10/15, resolved on FU CXR 10/16 and  10/04/14  (note HD cath attempts [facility])    - does not meet sbt criteria 10/04/2014 due to pressor needs P:   Vent support F/u CXR F/u ABG  PRN BD   CARDIOVASCULAR CVL R IJ CVL 10/15>>> Cardiac arrest - shockable rhythm per AED, ~10 mins CPR Hx ischemic cardiomyopathy - EF 35-40% per echo 07/2014, but 19% per nuclear study Hx HTN Cardiogenic shock Bradycardia, junctional   - on amio/hypothermia   - still needing pressors, HR 77. OFf amio due to bradycardia  P:  Cardiology following- Defer decision for anticoagulation to cards  Hypothermia protocol -rewarm at 2100 ASA volume to increase CVP to 10-12 range , keep pos on CRRT Will need AICD if survives this   RENAL Hyperkalemia on admit -related to lisinopril/aldactone -resolved AKI - anuric, post arrest P:   Ct CRRT, keep pos until CVP 12 then even  GASTROINTESTINAL Hx cholelithiasis -deemed not to be a surgical candidate for  cholecystectomy recently.  Hx cirrhosis - ?NASH v ETOH -not felt to be a surgical liver transplant candidate both due to multiple comorbidites    - not on tube feeds   P:   Trend LFT's  PPI  NPO  Dietary cnsultation  HEMATOLOGIC Severe thrombocytopenia due to acute illness and cirrhosis Anemia of critical illness in setting of NSTEMI  P:  PRBC for goal hgb > 8gm% ; 1 unit 10/04/2014 Platelets if drops further or bleed F/u cbc   NO HEPARIN or OTHER ANTICOAGULANTS in SETTING OF LOW PLATELETS Monitor plt   INFECTIOUS No active issue  P:   Monitor wbc, fever curve off abx   empiric unasyn, note recent h/o e coli sepsis  ENDOCRINE Hx DM    P:   Insulin gtt - transition only after rewarming  NEUROLOGIC AMS - post cardiac arrest  Fall    - following commands on sedation gtt P:   Sedation per hypothermia protocol   FAMILY Family updated: 10/15 at bedside in ER. None at bedside 10/04/2014    The patient is critically ill with multiple organ systems failure and requires high  complexity decision making for assessment and support, frequent evaluation and titration of therapies, application of advanced monitoring technologies and extensive interpretation of multiple databases.   Critical Care Time devoted to patient care services described in this note is  35  Minutes. This time reflects time of care of this signee Dr Kalman Shan. This critical care time does not reflect procedure time, or teaching time or supervisory time of PA/NP/Med student/Med Resident etc.,    Dr. Kalman Shan, M.D., [facility].C.P Pulmonary and Critical Care Medicine Staff Physician [facility] [place] [facility] Pager: [phone], If no answer or between  15:00h - 7:00h: call [phone]  10/04/2014 8:23 AM

## 2014-10-04 NOTE — Progress Notes (Signed)
CRITICAL VALUE ALERT  Critical value received:  Hgb, 7.9, plt 21  Date of notification:  10/04/2014  Time of notification:  0625  Critical value read back:yes Nurse who received alert:  Anselm Lis  MD notified (1st page):  Shelba Flake  Time of first page:  0626 MD notified (2nd page):  Time of second page:  Responding MD:  Deterding  Time MD responded:  0630

## 2014-10-04 NOTE — Progress Notes (Signed)
I was unable to add heparin to the treatment during filter change. Will add heparin to treatment with the next filter change.

## 2014-10-04 NOTE — Progress Notes (Signed)
Patient Name: [name] Date of Encounter: 10/04/2014     Active Problems:   Respiratory failure   Cardiac arrest   Cardiogenic shock    SUBJECTIVE  Remains sedated on vent. Rewarming in process. Rhythm NSR 72/min. Remains on pressure support with levophed and vasopressin.  CURRENT MEDS . ampicillin-sulbactam (UNASYN) IV  1.5 g Intravenous Q6H  . antiseptic oral rinse  7 mL Mouth Rinse QID  . artificial tears  1 application Both Eyes 3 times per day  . chlorhexidine  15 mL Mouth Rinse BID  . fentaNYL  100 mcg Intravenous Once  . insulin aspart  2-6 Units Subcutaneous 6 times per day  . insulin glargine  10 Units Subcutaneous Q24H  . pantoprazole (PROTONIX) IV  40 mg Intravenous Q24H  . sodium chloride  1,000 mL Intravenous Once  . sodium chloride  10-40 mL Intracatheter Q12H    OBJECTIVE  Filed Vitals:   10/04/14 0500 10/04/14 0518 10/04/14 0550 10/04/14 0600  BP:  134/47  113/38  Pulse: 66 68 69 69  Temp: 95.9 F (35.5 C)   96.3 F (35.7 C)  TempSrc: Core (Comment)     Resp: 18 18 0 0  Height:      Weight:   246 lb 4.1 oz (111.7 kg)   SpO2: 96% 97% 99% 99%    Intake/Output Summary (Last 24 hours) at 10/04/14 0727 Last data filed at 10/04/14 0700  Gross per 24 hour  Intake 6413.11 ml  Output    222 ml  Net 6191.11 ml   Filed Weights   10/02/14 1700 10/02/14 1945 10/04/14 0550  Weight: 243 lb 6.2 oz (110.4 kg) 243 lb 6.2 oz (110.4 kg) 246 lb 4.1 oz (111.7 kg)    PHYSICAL EXAM   Lungs:  Resp regular and unlabored on vent. Clear anteriorly Heart: RRR no s3, s4. No rub. Grade 2/6 systolic ejection murmur at base. Abdomen: Soft, non-tender, non-distended, BS + x 4.  Extremities: No clubbing, cyanosis or edema. Pedal pulses not felt. Vasoconstricted.  Accessory Clinical Findings  CBC  Recent Labs  10/02/14 1734  10/03/14 0500  10/03/14 1132 10/04/14 0345  WBC 12.2*  --  12.7*  --   --  5.4  NEUTROABS 10.7*  --   --   --   --   --   HGB  10.3*  < > 10.5*  < > 8.8* 7.9*  HCT 30.0*  < > 30.0*  < > 26.0* 22.1*  MCV 89.0  --  85.5  --   --  85.0  PLT 36*  --  31*  --   --  21*  < > = values in this interval not displayed. Basic Metabolic Panel  Recent Labs  10/03/14 0500  10/03/14 1630  10/03/14 2330 10/04/14 0345  NA  --   < > 135*  < > 134* 135*  K  --   < > 5.7*  < > 5.5* 5.3  CL  --   < > 103  < > 104 104  CO2  --   --  20  < > 19 20  GLUCOSE  --   < > 160*  < > 157* 168*  BUN  --   < > 29*  < > 26* 24*  CREATININE  --   < > 1.20  < > 1.08 1.11  CALCIUM  --   --  7.2*  < > 6.7* 6.8*  MG 2.2  --   --   --   --  2.0  PHOS  --   --  3.0  --   --  3.1  < > = values in this interval not displayed. Liver Function Tests  Recent Labs  10/02/14 1734  10/03/14 1630 10/04/14 0345  AST 44*  --   --   --   ALT 35  --   --   --   ALKPHOS 117  --   --   --   BILITOT 0.8  --   --   --   PROT 5.8*  --   --   --   ALBUMIN 3.1*  < > 2.7* 2.4*  < > = values in this interval not displayed. No results found for this basename: LIPASE, AMYLASE,  in the last 72 hours Cardiac Enzymes  Recent Labs  10/02/14 2200 10/03/14 0400  TROPONINI 1.86* 1.72*   BNP No components found with this basename: POCBNP,  D-Dimer No results found for this basename: DDIMER,  in the last 72 hours Hemoglobin A1C No results found for this basename: HGBA1C,  in the last 72 hours Fasting Lipid Panel No results found for this basename: CHOL, HDL, LDLCALC, TRIG, CHOLHDL, LDLDIRECT,  in the last 72 hours Thyroid Function Tests No results found for this basename: TSH, T4TOTAL, FREET3, T3FREE, THYROIDAB,  in the last 72 hours  TELE  NSR  ECG  Repeat pending  Radiology/Studies  Ct Head Wo Contrast  10/02/2014   CLINICAL DATA:  53 year old male with history of trauma after after a fall during a syncopal episode during which the patient fell backward and struck his head. Status post CPR.  EXAM: CT HEAD WITHOUT CONTRAST  TECHNIQUE:  Contiguous axial images were obtained from the base of the skull through the vertex without intravenous contrast.  COMPARISON:  No priors.  FINDINGS: No acute displaced skull fractures are identified. No acute intracranial abnormality. Specifically, no evidence of acute post-traumatic intracranial hemorrhage, no definite regions of acute/subacute cerebral ischemia, no focal mass, mass effect, hydrocephalus or abnormal intra or extra-axial fluid collections. The visualized paranasal sinuses and mastoids are well pneumatized, with exception of a small air-fluid level in the posterior aspect of the left maxillary sinus and right sphenoid sinus.  IMPRESSION: 1. Small amount of soft tissue swelling in the occipital scalp, without underlying displaced skull fracture or acute intracranial abnormalities. 2. Small air-fluid level in the left maxillary sinus and right sphenoid sinus. This is nonspecific, but correlation for signs of sinusitis is recommended.   Electronically Signed   By: Trudie Reed M.D.   On: 10/02/2014 16:36   Dg Chest Port 1 View  10/04/2014   CLINICAL DATA:  Acute respiratory failure  EXAM: PORTABLE CHEST - 1 VIEW  COMPARISON:  Radiograph 10/03/2014  FINDINGS: Endotracheal tube, NG tube, right central venous line are unchanged. Stable cardiac silhouette. The is dense left basilar atelectasis unchanged. Mild right basilar airspace disease. No pneumothorax  IMPRESSION: 1. Stable support apparatus. 2. No change in dense left basilar atelectasis/airspace disease. 3. Mild airspace disease on the right. 4. Overall no interval change   Electronically Signed   By: Genevive Bi M.D.   On: 10/04/2014 07:22   Dg Chest Port 1 View  10/03/2014   CLINICAL DATA:  Respiratory failure/hypoxia  EXAM: PORTABLE CHEST - 1 VIEW  COMPARISON:  October 02, 2014  FINDINGS: Endotracheal tube tip is 2.7 cm above the carina. Nasogastric tube tip and side port are below the diaphragm. Central catheter tip is in the  superior  vena cava. The questionable trace pneumothorax on the left seen 1 day prior is not appreciable on this examination. No pneumothorax is apparent currently.  There is persistent consolidation in the left lower lobe. Hazy opacity throughout much of the right lung is slightly less apparent currently. No new opacity is seen. Heart is enlarged with pulmonary vascularity within normal limits. No adenopathy.  IMPRESSION: Tube and catheter positions as described without appreciable pneumothorax. Infiltrate bilaterally, more on the right than on the left, is again noted. There is slightly less consolidation on the right and no appreciable change in the left lower lobe. No new opacity.   Electronically Signed   By: Bretta BangWilliam  Woodruff M.D.   On: 10/03/2014 07:56   Dg Chest Port 1 View  10/02/2014   CLINICAL DATA:  Unsuccessful attempted left-sided central line placement. Subsequent encounter.  EXAM: PORTABLE CHEST - 1 VIEW  COMPARISON:  10/02/2014 (multiple examinations)  FINDINGS: Grossly unchanged enlarged cardiac silhouette and mediastinal contours. Stable positioning of support apparatus. Query trace left apical pneumothorax. Pulmonary vasculature remains indistinct with cephalization of flow with relative area of confluence about the right mid lung. Grossly unchanged left basilar/retrocardiac consolidative opacities. Trace of sided effusion is not excluded. Unchanged bones.  IMPRESSION: 1.  Stable positioning of support apparatus. 2. Query trace left apical pneumothorax. Continued attention on follow-up is recommended. 3. Similar findings of asymmetric pulmonary edema worse within the right mid and left lower lung. Critical Value/emergent results were called by telephone at the time of interpretation on 10/02/2014 at 10:23 pm to [facility], [place], who verbally acknowledged these results.   Electronically Signed   By: Simonne ComeJohn  Watts M.D.   On: 10/02/2014 22:24   Dg Chest Portable 1 View  10/02/2014   CLINICAL DATA:   Readjustment of endotracheal tube.  EXAM: PORTABLE CHEST - 1 VIEW  COMPARISON:  Same day.  FINDINGS: Stable cardiomediastinal silhouette. Endotracheal tube is now seen with distal tip 3 cm above the carina in grossly good position. Right internal jugular catheter remains with distal tip overlying expected position of the SVC. Stable bilateral lung opacities are noted. No pneumothorax or pleural effusion is noted.  IMPRESSION: Stable bilateral lung opacities. Endotracheal tube in grossly good position.   Electronically Signed   By: Roque LiasJames  Green M.D.   On: 10/02/2014 17:04   Dg Chest Portable 1 View  10/02/2014   CLINICAL DATA:  Status post intubation and central line placement.  EXAM: PORTABLE CHEST - 1 VIEW  COMPARISON:  July 18, 2014.  FINDINGS: Right internal jugular catheter line is noted with distal tip in expected position of the SVC. Endotracheal tube is directed in the right mainstem bronchus. It is recommended at be withdrawn approximately 4 cm. Diffuse airspace opacities are noted in the right lung in left upper lobe, as well as large left retrocardiac opacity concerning for bilateral pneumonia. No pneumothorax or significant pleural effusion is noted. Bony thorax is intact.  IMPRESSION: Significantly increased bilateral lung opacities are noted concerning for pneumonia or possibly edema. Endotracheal tube is directed in the right mainstem bronchus; it is recommended to be withdrawn at least 4 cm. Critical Value/emergent results were called by telephone at the time of interpretation on 10/02/2014 at 4:15 pm to Dr. Zadie RhineNALD WICKLINE , who verbally acknowledged these results.   Electronically Signed   By: Roque LiasJames  Green M.D.   On: 10/02/2014 16:15   Dg Abd Portable 1v  10/02/2014   CLINICAL DATA:  OG tube insertion  EXAM: PORTABLE ABDOMEN -  1 VIEW  COMPARISON:  None.  FINDINGS: Enteric tube tip and side port projects over the expected location of the gastric fundus.  Paucity of bowel gas without definite  evidence of obstruction. No supine evidence of pneumoperitoneum. No definite pneumatosis or portal venous gas.  Limited visualization the lower thorax demonstrates enlargement of the cardiac silhouette and epicardial pacer pads overlying the left ventricular apex.  IMPRESSION: Enteric tube tip and side port overlie the gastric fundus.   Electronically Signed   By: Simonne Come M.D.   On: 10/02/2014 22:29    ASSESSMENT AND PLAN  1. Cardiac arrest  - likely primary event in the setting of nonischemic cardiomyopathy  - ROSC after shocked x 2 by AED  - normally need cath, however ?cath candidate with severe thrombocytopenia.. Once patient stable, can consider coronary CT and EP consult for ICD for secondary prevention (depend on his prognosis and neurological recovery)  2. H/o severe ischemic cardiomyopathy  - EF 35% by echo, 19% by stress test  - Stress test noted inferior and apex scar  3. Severe thrombocytopenia, platelet 21 k today 4. bicuspid aortic valve  5. H/o mild pericardial effusion on recent echo  6. h/o pancreatitis  7. H/o recent sepsis with biliary origin: refused by surgery to cholecystectomy due to perioperative risk with h/o cirrhosis  8. Cirrhosis  9. Obesity  10. DM  11.Junctional rhythm resolved with rewarming. 12. Anemia.  Plan: Continue current rx. EKG pending.  Signed, Cassell Clement MD

## 2014-10-05 ENCOUNTER — Inpatient Hospital Stay (HOSPITAL_COMMUNITY): Payer: 59

## 2014-10-05 DIAGNOSIS — E118 Type 2 diabetes mellitus with unspecified complications: Secondary | ICD-10-CM

## 2014-10-05 DIAGNOSIS — K7581 Nonalcoholic steatohepatitis (NASH): Secondary | ICD-10-CM

## 2014-10-05 LAB — BLOOD GAS, ARTERIAL
Acid-base deficit: 3.8 mmol/L — ABNORMAL HIGH (ref 0.0–2.0)
Bicarbonate: 21.1 mEq/L (ref 20.0–24.0)
Drawn by: 418751
FIO2: 0.4 %
O2 Saturation: 99.8 %
PEEP: 5 cmH2O
PH ART: 7.334 — AB (ref 7.350–7.450)
Patient temperature: 98
RATE: 18 resp/min
TCO2: 22.4 mmol/L (ref 0–100)
VT: 500 mL
pCO2 arterial: 40.6 mmHg (ref 35.0–45.0)
pO2, Arterial: 145 mmHg — ABNORMAL HIGH (ref 80.0–100.0)

## 2014-10-05 LAB — BASIC METABOLIC PANEL
ANION GAP: 10 (ref 5–15)
BUN: 25 mg/dL — ABNORMAL HIGH (ref 6–23)
CALCIUM: 7.2 mg/dL — AB (ref 8.4–10.5)
CO2: 23 mEq/L (ref 19–32)
Chloride: 103 mEq/L (ref 96–112)
Creatinine, Ser: 1.64 mg/dL — ABNORMAL HIGH (ref 0.50–1.35)
GFR calc Af Amer: 54 mL/min — ABNORMAL LOW (ref >90)
GFR, EST NON AFRICAN AMERICAN: 47 mL/min — AB (ref >90)
Glucose, Bld: 162 mg/dL — ABNORMAL HIGH (ref 70–99)
Potassium: 4.9 mEq/L (ref 3.7–5.3)
Sodium: 136 mEq/L — ABNORMAL LOW (ref 137–147)

## 2014-10-05 LAB — GLUCOSE, CAPILLARY
GLUCOSE-CAPILLARY: 251 mg/dL — AB (ref 70–99)
GLUCOSE-CAPILLARY: 256 mg/dL — AB (ref 70–99)
Glucose-Capillary: 156 mg/dL — ABNORMAL HIGH (ref 70–99)
Glucose-Capillary: 176 mg/dL — ABNORMAL HIGH (ref 70–99)
Glucose-Capillary: 263 mg/dL — ABNORMAL HIGH (ref 70–99)
Glucose-Capillary: 259 mg/dL — ABNORMAL HIGH (ref 70–99)
Glucose-Capillary: 204 mg/dL — ABNORMAL HIGH (ref 70–99)

## 2014-10-05 LAB — CBC
HCT: 20.6 % — ABNORMAL LOW (ref 39.0–52.0)
HEMATOCRIT: 20.4 % — AB (ref 39.0–52.0)
HEMATOCRIT: 21.4 % — AB (ref 39.0–52.0)
Hemoglobin: 7.1 g/dL — ABNORMAL LOW (ref 13.0–17.0)
Hemoglobin: 7 g/dL — ABNORMAL LOW (ref 13.0–17.0)
Hemoglobin: 7.1 g/dL — ABNORMAL LOW (ref 13.0–17.0)
MCH: 31.3 pg (ref 26.0–34.0)
MCH: 29.8 pg (ref 26.0–34.0)
MCH: 29.7 pg (ref 26.0–34.0)
MCHC: 34.8 g/dL (ref 30.0–36.0)
MCHC: 34 g/dL (ref 30.0–36.0)
MCHC: 33.2 g/dL (ref 30.0–36.0)
MCV: 89.9 fL (ref 78.0–100.0)
MCV: 87.7 fL (ref 78.0–100.0)
MCV: 89.5 fL (ref 78.0–100.0)
PLATELETS: 27 10*3/uL — AB (ref 150–400)
PLATELETS: 19 10*3/uL — AB (ref 150–400)
PLATELETS: 16 10*3/uL — AB (ref 150–400)
RBC: 2.27 MIL/uL — ABNORMAL LOW (ref 4.22–5.81)
RBC: 2.35 MIL/uL — AB (ref 4.22–5.81)
RBC: 2.39 MIL/uL — ABNORMAL LOW (ref 4.22–5.81)
RDW: 15.5 % (ref 11.5–15.5)
RDW: 15.6 % — ABNORMAL HIGH (ref 11.5–15.5)
RDW: 16.2 % — ABNORMAL HIGH (ref 11.5–15.5)
WBC: 4.9 10*3/uL (ref 4.0–10.5)
WBC: 3.2 10*3/uL — ABNORMAL LOW (ref 4.0–10.5)
WBC: 3.3 10*3/uL — AB (ref 4.0–10.5)

## 2014-10-05 LAB — RENAL FUNCTION PANEL
ANION GAP: 6 (ref 5–15)
Albumin: 2.1 g/dL — ABNORMAL LOW (ref 3.5–5.2)
BUN: 30 mg/dL — ABNORMAL HIGH (ref 6–23)
CHLORIDE: 102 meq/L (ref 96–112)
CO2: 25 mEq/L (ref 19–32)
Calcium: 7 mg/dL — ABNORMAL LOW (ref 8.4–10.5)
Creatinine, Ser: 1.57 mg/dL — ABNORMAL HIGH (ref 0.50–1.35)
GFR calc Af Amer: 57 mL/min — ABNORMAL LOW (ref >90)
GFR calc non Af Amer: 49 mL/min — ABNORMAL LOW (ref >90)
Glucose, Bld: 262 mg/dL — ABNORMAL HIGH (ref 70–99)
POTASSIUM: 5 meq/L (ref 3.7–5.3)
Phosphorus: 2.8 mg/dL (ref 2.3–4.6)
Sodium: 133 mEq/L — ABNORMAL LOW (ref 137–147)

## 2014-10-05 LAB — ALBUMIN: Albumin: 2.3 g/dL — ABNORMAL LOW (ref 3.5–5.2)

## 2014-10-05 LAB — PHOSPHORUS: Phosphorus: 3.5 mg/dL (ref 2.3–4.6)

## 2014-10-05 LAB — APTT: aPTT: 31 seconds (ref 24–37)

## 2014-10-05 LAB — PREPARE RBC (CROSSMATCH)

## 2014-10-05 LAB — MAGNESIUM: Magnesium: 2.1 mg/dL (ref 1.5–2.5)

## 2014-10-05 MED ORDER — SODIUM CHLORIDE 0.9 % IV SOLN
Freq: Once | INTRAVENOUS | Status: AC
  Administered 2014-10-05: 10:00:00 via INTRAVENOUS

## 2014-10-05 MED ORDER — SODIUM CHLORIDE 0.9 % IV SOLN: Freq: Once | INTRAVENOUS | Status: DC

## 2014-10-05 MED ORDER — INSULIN ASPART 100 UNIT/ML ~~LOC~~ SOLN
0.0000 [IU] | SUBCUTANEOUS | Status: DC
  Administered 2014-10-05: 8 [IU] via SUBCUTANEOUS
  Administered 2014-10-05: 5 [IU] via SUBCUTANEOUS
  Administered 2014-10-06: 2 [IU] via SUBCUTANEOUS
  Administered 2014-10-06: 8 [IU] via SUBCUTANEOUS
  Administered 2014-10-06 (×4): 3 [IU] via SUBCUTANEOUS
  Administered 2014-10-06: 5 [IU] via SUBCUTANEOUS
  Administered 2014-10-07 (×3): 2 [IU] via SUBCUTANEOUS

## 2014-10-05 NOTE — Progress Notes (Signed)
Hgb 7.1 , alerted Elink M.D. Will continue to monitor.

## 2014-10-05 NOTE — Progress Notes (Signed)
eLink Physician-Brief Progress Note Patient Name: [name] DOB: August 28, 1961 MRN: [id number]   Date of Service  10/05/2014  HPI/Events of Note  Nurse call.  Hgb 7, POC glucose 259  eICU Interventions  Transfuse 1 unit PRBC, change from sensitive to moderate SSI.     Intervention Category Intermediate Interventions: Bleeding - evaluation and treatment with blood products;Hyperglycemia - evaluation and treatment  Syliva Mee K. 10/05/2014, 4:04 PM

## 2014-10-05 NOTE — Progress Notes (Signed)
Pt CVP 12-13. Rn called Allena Katz MD to verify order to continue CRRT without pulling fluid off pt. MD instructed that it is okay that pt CVP is slightly above range and to continue ordered therapy. Will continue to monitor CVP; if CVP continues to increase, nurse will notify MD again.

## 2014-10-05 NOTE — Progress Notes (Signed)
CRITICAL VALUE ALERT  Critical value received:  Platelets 19,000 Date of notification:  10/18  Time of notification:  1615  Critical value read back:Yes.    Nurse who received alert:  Orson Gear   MD notified (1st page):  Belinda Block MD    Time of first page:  1616  MD notified (2nd page):  Time of second page:  Responding MD:  Belinda Block MD   Time MD responded:  [phone]

## 2014-10-05 NOTE — Progress Notes (Signed)
Patient ID: [name], male   DOB: [birth year], 53 y.o.   MRN: [id number] S: intubated, sedated, sister at bedside  O:BP 91/37  Pulse 89  Temp(Src) 98.1 F (36.7 C) (Core (Comment))  Resp 17  Ht 5\' 10"  (1.778 m)  Wt 258 lb 13.1 oz (117.4 kg)  BMI 37.14 kg/m2  SpO2 100%  Intake/Output Summary (Last 24 hours) at 10/05/14 0828 Last data filed at 10/05/14 0815  Gross per 24 hour  Intake 2970.23 ml  Output    186 ml  Net 2784.23 ml   Intake/Output: I/O last 3 completed shifts: In: 7035.9 [I.V.:2391.9; Blood:335; NG/GT:899; IV Piggyback:3410] Out: 239 [Urine:264]  Intake/Output this shift:  Total I/O In: 97 [I.V.:57; NG/GT:40] Out: 54 [Other:54] Weight change: 12 lb 9.1 oz (5.7 kg)  Gen: NAD, appears comfortable Cardio: RRR, no m/r/g Resp: CTAB, on vent Abd: soft, no bowel sounds Ext: no edema, cool feet. 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   Recent Labs Lab 10/02/14 1734  10/03/14 0425  10/03/14 1630 10/03/14 2000 10/03/14 2330 10/04/14 0345 10/04/14 0800 10/04/14 1152 10/04/14 1600 10/05/14 0223  NA 135*  < > 134*  < > 135* 135* 134* 135* 134* 133* 138 136*  K 6.9*  < > 4.7  < > 5.7* 5.7* 5.5* 5.3 5.2 5.6* 5.5* 4.9  CL 106  < > 102  < > 103 104 104 104 103 102 106 103  CO2 19  < > 17*  --  20 20 19 20 20 20 19 23   GLUCOSE 183*  < > 235*  < > 160* 168* 157* 168* 173* 201* 202* 162*  BUN 41*  < > 36*  < > 29* 27* 26* 24* 24* 24* 26* 25*  CREATININE 1.38*  < > 1.24  < > 1.20 1.13 1.08 1.11 1.17 1.48* 1.68* 1.64*  ALBUMIN 3.1*  --  3.1*  --  2.7*  --   --  2.4*  --   --  2.4* 2.3*  CALCIUM 7.5*  < > 7.8*  --  7.2* 6.8* 6.7* 6.8* 6.6* 7.0* 7.0* 7.2*  PHOS  --   --  2.7  --  3.0  --   --  3.1  --   --  4.1 3.5  AST 44*  --   --   --   --   --   --   --   --   --   --   --   ALT 35  --   --   --   --   --   --   --   --   --   --   --   < > = values in this interval not displayed. Liver Function Tests:  Recent Labs Lab 10/02/14 1734  10/04/14 0345  10/04/14 1600 10/05/14 0223  AST 44*  --   --   --   --   ALT 35  --   --   --   --   ALKPHOS 117  --   --   --   --   BILITOT 0.8  --   --   --   --   PROT 5.8*  --   --   --   --   ALBUMIN 3.1*  < > 2.4* 2.4* 2.3*  < > = values in this interval not displayed. No results found for this basename: LIPASE, AMYLASE,  in the last 168 hours No results found  for this basename: AMMONIA,  in the last 168 hours CBC:  Recent Labs Lab 10/02/14 1734  10/03/14 0500  10/04/14 0345 10/04/14 1700 10/05/14 0309  WBC 12.2*  --  12.7*  --  5.4 5.5 4.9  NEUTROABS 10.7*  --   --   --   --  4.5  --   HGB 10.3*  < > 10.5*  < > 7.9* 7.7* 7.1*  HCT 30.0*  < > 30.0*  < > 22.1* 22.6* 20.4*  MCV 89.0  --  85.5  --  85.0 88.3 89.9  PLT 36*  --  31*  --  21* 26* 27*  < > = values in this interval not displayed. Cardiac Enzymes:  Recent Labs Lab 10/02/14 2200 10/03/14 0400  TROPONINI 1.86* 1.72*   CBG:  Recent Labs Lab 10/04/14 1606 10/04/14 1956 10/04/14 2327 10/05/14 0412 10/05/14 0733  GLUCAP 186* 186* 168* 156* 176*    Iron Studies: No results found for this basename: IRON, TIBC, TRANSFERRIN, FERRITIN,  in the last 72 hours  Assessment/Plan:  1. ARF, oliguric- in setting of cardiac arrest while on lisinopril. Started on CVVHD 10/15, tolerating well. CVP 5-8 2. Hyperkalemia- Resolved with CVVHD, repeat bmet at 16:00 scheduled 3. Anemia- normocytic.  S/p 1u 10/17. No response/still downtrending. Transfusing again today 4. Hypocalcemia - repleted 10/17 with 1g calcium gluconate IV, now 7.2 (corrects to 8.6) 5. Cardiac arrest- on amio and cardiology following 6. VDRF- per PCCM 7. SIRS- hypovolemic- will keep + with IVF's, cont pressors 8. DM- per PCCM 9. Cirrhosis 10. Thrombocytopenia - stable at [id number]  Beverely Lowdamo, Elena 10/05/2014, 8:28 AM   I have seen and examined this patient and agree with plan as outlined by Dr. Richarda BladeAdamo.  Pt remains hypotensive and low CVP, has been + with CVVHD and  will cont for now.  Potassium normalized however Hgb continues to drop, continue to transfuse.  Need to r/o GIB. Earle Troiano A,MD 10/05/2014 2:21 PM

## 2014-10-05 NOTE — Progress Notes (Signed)
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 Pt suddenly collapsed, fell backward and hit his head.  AED advised shockable rhythm. Approx 10 minutes CPR with ROSC before EMS arrival.  PCCM called to admit.  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 with splenomegaly, ascites and thrombocytopenia, gallstone pancreatitis,Type II DM with diabetic nephropathy, neuropathy and retinopathy ,07/2014  hospitalized for e coli  sepsis     STUDIES:  CT head 10/15>>>neg, Small air-fluid level in the left maxillary sinus and right sphenoid sinus    SIGNIFICANT EVENTS: 10/15 hyperkalemia (on aldactone & lisinopril) requiring CRRT 10/03/14 : : Cooled to 33  Bradycardic , on max pressors Anuric Sedated , paralysed On CRRT 10/04/14 On CRRT for high K., on pressors., Just rewarmed: following all commands   SUBJECTIVE/OVERNIGHT/INTERVAL HX 10/05/14: Following commands, coming down on sedation and pressor need. CVP 8 but has 3rd spacing and is still on CRRT. Making some urine    VITAL SIGNS: Temp:  [97.5 F (36.4 C)-98.9 F (37.2 C)] 98.1 F (36.7 C) (10/18 0800) Pulse Rate:  [83-90] 89 (10/18 0800) Resp:  [0-19] 17 (10/18 0800) BP: (73-109)/(24-44) 91/37 mmHg (10/18 0800) SpO2:  [96 %-100 %] 100 % (10/18 0800) Arterial Line BP: (102-230)/(40-78) 137/42 mmHg (10/18 0800) FiO2 (%):  [40 %] 40 % (10/18 0800) Weight:  [117.4 kg (258 lb 13.1 oz)] 117.4 kg (258 lb 13.1 oz) (10/18 0500) HEMODYNAMICS: CVP:  [5 mmHg-10 mmHg] 5 mmHg VENTILATOR SETTINGS: Vent Mode:  [-] PRVC FiO2 (%):   [40 %] 40 % Set Rate:  [18 bmp] 18 bmp Vt Set:  [500 mL] 500 mL PEEP:  [5 cmH20] 5 cmH20 Plateau Pressure:  [14 cmH20-18 cmH20] 15 cmH20 INTAKE / OUTPUT:  Intake/Output Summary (Last 24 hours) at 10/05/14 0816 Last data filed at 10/05/14 0800  Gross per 24 hour  Intake 2967.15 ml  Output    186 ml  Net 2781.15 ml    PHYSICAL EXAMINATION: General:  Chronically an crtically ill appearing male, acutely ill  Neuro: On fent and versed gtt. Off nimbex x 24h, -> RASS -1, Follows all commands. Moves all 4s HEENT:  Mm dry, no JVD Cardiovascular:  s1s2 tachy, distant Lungs:  resps even non labored on vent, good air entry BL, bloody secretions  Abdomen:  Obese, round, soft, hypoactive bs Musculoskeletal:  Pale, cool and dry, scant BLE edema   LABS:  PULMONARY  Recent Labs Lab 10/02/14 1717  10/02/14 2156 10/02/14 2258 10/03/14 0034 10/03/14 0836 10/03/14 1132 10/05/14 0357  PHART 7.238*  --  7.259*  --   --   --   --  7.334*  PCO2ART 48.3*  --  40.0  --   --   --   --  40.6  PO2ART 207.0*  --  282.0*  --   --   --   --  145.0*  HCO3 21.4  --  17.3*  --   --   --   --  21.1  TCO2 23  < > 18.5 19 18 20 21  22.4  O2SAT 100.0  --  100.0  --   --   --   --  99.8  < > = values in this interval not displayed.  CBC  Recent Labs Lab 10/04/14 0345 10/04/14 1700 10/05/14 0309  HGB 7.9* 7.7* 7.1*  HCT 22.1* 22.6* 20.4*  WBC 5.4 5.5 4.9  PLT 21* 26* 27*    COAGULATION  Recent Labs Lab 10/02/14 1700 10/02/14 2344  INR 1.30 1.33    CARDIAC    Recent Labs Lab 10/02/14 2200 10/03/14 0400  TROPONINI 1.86* 1.72*   No results found for this basename: PROBNP,  in the last 168 hours   CHEMISTRY  Recent Labs Lab 10/02/14 1734  10/03/14 0425 10/03/14 0500  10/03/14 1630  10/04/14 0345 10/04/14 0800 10/04/14 1152 10/04/14 1600 10/05/14 0223  NA 135*  < > 134*  --   < > 135*  < > 135* 134* 133* 138 136*  K 6.9*  < > 4.7  --   < > 5.7*  < > 5.3 5.2 5.6* 5.5* 4.9   CL 106  < > 102  --   < > 103  < > 104 103 102 106 103  CO2 19  < > 17*  --   --  20  < > 20 20 20 19 23   GLUCOSE 183*  < > 235*  --   < > 160*  < > 168* 173* 201* 202* 162*  BUN 41*  < > 36*  --   < > 29*  < > 24* 24* 24* 26* 25*  CREATININE 1.38*  < > 1.24  --   < > 1.20  < > 1.11 1.17 1.48* 1.68* 1.64*  CALCIUM 7.5*  < > 7.8*  --   --  7.2*  < > 6.8* 6.6* 7.0* 7.0* 7.2*  MG 2.2  --   --  2.2  --   --   --  2.0  --   --   --  2.1  PHOS  --   --  2.7  --   --  3.0  --  3.1  --   --  4.1 3.5  < > = values in this interval not displayed. Estimated Creatinine Clearance: 67.7 ml/min (by C-G formula based on Cr of 1.64).   LIVER  Recent Labs Lab 10/02/14 1700  10/02/14 1734 10/02/14 2344 10/03/14 0425 10/03/14 1630 10/04/14 0345 10/04/14 1600 10/05/14 0223  AST  --   --  44*  --   --   --   --   --   --   ALT  --   --  35  --   --   --   --   --   --   ALKPHOS  --   --  117  --   --   --   --   --   --   BILITOT  --   --  0.8  --   --   --   --   --   --   PROT  --   --  5.8*  --   --   --   --   --   --   ALBUMIN  --   < > 3.1*  --  3.1* 2.7* 2.4* 2.4* 2.3*  INR 1.30  --   --  1.33  --   --   --   --   --   < > = values in this interval  not displayed.   INFECTIOUS No results found for this basename: LATICACIDVEN, PROCALCITON,  in the last 168 hours   ENDOCRINE CBG (last 3)   Recent Labs  10/04/14 2327 10/05/14 0412 10/05/14 0733  GLUCAP 168* 156* 176*         IMAGING x48h Dg Chest Port 1 View  10/04/2014   CLINICAL DATA:  Acute respiratory failure  EXAM: PORTABLE CHEST - 1 VIEW  COMPARISON:  Radiograph 10/03/2014  FINDINGS: Endotracheal tube, NG tube, right central venous line are unchanged. Stable cardiac silhouette. The is dense left basilar atelectasis unchanged. Mild right basilar airspace disease. No pneumothorax  IMPRESSION: 1. Stable support apparatus. 2. No change in dense left basilar atelectasis/airspace disease. 3. Mild airspace disease on the right.  4. Overall no interval change   Electronically Signed   By: Genevive Bi M.D.   On: 10/04/2014 07:22        ASSESSMENT / PLAN:  PULMONARY OETT 10/15>>> Acute respiratory failure, post arrest Apical left pneumothx noted on CXR 10/15, resolved on FU CXR 10/16 and 10/04/14  (note HD cath attempts [facility])    - does not meet sbt criteria 10/05/2014 due to pressor needs P:   Vent support F/u CXR F/u ABG  PRN BD   CARDIOVASCULAR CVL R IJ CVL 10/15>>> Cardiac arrest - shockable rhythm per AED, ~10 mins CPR Hx ischemic cardiomyopathy - EF 35-40% per echo 07/2014, but 19% per nuclear study Hx HTN Cardiogenic shock Bradycardia, junctional   - on amio/hypothermia   - still needing pressors but requirements are down on 10/05/14 , HR 86. OFf amio x48h  due to bradycardia  P:  Dc vasopressin  Continue levophed but change for map goal  > 55 and  sbp > 100 (has wide pulse pressure noted 10/05/14) Cardiology following- Defer decision for anticoagulation to cards  Volume status per cards Will need AICD if survives this   RENAL Hyperkalemia on admit -related to lisinopril/aldactone -resolved AKI - anuric, post arrest   - making some urine. CVP 8 but has 3rd spacing and needing pressors P:   Volume mgmt per renal  GASTROINTESTINAL Hx cholelithiasis -deemed not to be a surgical candidate for cholecystectomy recently.  Hx cirrhosis - ?NASH v ETOH -not felt to be a surgical liver transplant candidate both due to multiple comorbidites    - on tube feeds since 10/04/14 and at goal   P:   Trend LFT's  As needed PPI  NPO  Tube feeds since 10/04/14  HEMATOLOGIC Severe thrombocytopenia due to acute illness and cirrhosis Anemia of critical illness in setting of NSTEMI  P:  PRBC for goal hgb > 8gm% ; 1 unit 10/04/2014 and repeat 10/05/14 Platelets if drops further or bleed F/u cbc   NO HEPARIN or OTHER ANTICOAGULANTS in SETTING OF LOW PLATELETS Monitor plt   INFECTIOUS No  active issue  P:   Monitor wbc, fever curve off abx   empiric unasyn, note recent h/o e coli sepsis  ENDOCRINE Hx DM    P:   Insulin gtt - transition only after rewarming  NEUROLOGIC AMS - post cardiac arrest  Fall    - following commands on sedation gtt on fentanyl and versed  P:   DC versed gtt Continue fentanyl gtt RASS goal o to -2   FAMILY Family updates: 10/15 at bedside in ER. None at bedside 10/04/14 and 10/05/2014   SUMMARY  - work on weaning off presors; new MAP goal set. Work on weaning off  sedation gtt. Following commands. Renal handling volume status. No anticoagulants in setting of low platelets   The patient is critically ill with multiple organ systems failure and requires high complexity decision making for assessment and support, frequent evaluation and titration of therapies, application of advanced monitoring technologies and extensive interpretation of multiple databases.   Critical Care Time devoted to patient care services described in this note is  35  Minutes. This time reflects time of care of this signee Dr Kalman Shan. This critical care time does not reflect procedure time, or teaching time or supervisory time of PA/NP/Med student/Med Resident etc.,      Dr. Kalman Shan, M.D., [facility].C.P Pulmonary and Critical Care Medicine Staff Physician [facility] [place] [facility] Pager: [phone], If no answer or between  15:00h - 7:00h: call [phone]  10/05/2014 8:16 AM

## 2014-10-05 NOTE — Progress Notes (Signed)
Patient Name: [name] Date of Encounter: 10/05/2014     Active Problems:   Respiratory failure   Cardiac arrest   Cardiogenic shock    SUBJECTIVE  Remains on vent. Follows commands. Pressor support being cut back. Rhythm remains NSR.  CURRENT MEDS . ampicillin-sulbactam (UNASYN) IV  1.5 g Intravenous Q6H  . antiseptic oral rinse  7 mL Mouth Rinse QID  . chlorhexidine  15 mL Mouth Rinse BID  . feeding supplement (PRO-STAT SUGAR FREE 64)  30 mL Per Tube 5 X Daily  . feeding supplement (VITAL HIGH PROTEIN)  1,000 mL Per Tube Q24H  . fentaNYL  50 mcg Intravenous Once  . insulin aspart  2-6 Units Subcutaneous 6 times per day  . insulin glargine  10 Units Subcutaneous Daily  . pantoprazole (PROTONIX) IV  40 mg Intravenous Q24H  . sodium chloride  10-40 mL Intracatheter Q12H    OBJECTIVE  Filed Vitals:   10/05/14 0300 10/05/14 0400 10/05/14 0500 10/05/14 0600  BP:      Pulse: 85 83 90 85  Temp: 98.4 F (36.9 C) 98.4 F (36.9 C) 98.2 F (36.8 C) 98.4 F (36.9 C)  TempSrc:  Core (Comment)    Resp: 0 18 19 18   Height:      Weight:   258 lb 13.1 oz (117.4 kg)   SpO2: 100% 100% 100% 100%    Intake/Output Summary (Last 24 hours) at 10/05/14 0740 Last data filed at 10/05/14 0700  Gross per 24 hour  Intake 3618.38 ml  Output    145 ml  Net 3473.38 ml   Filed Weights   10/02/14 1945 10/04/14 0550 10/05/14 0500  Weight: 243 lb 6.2 oz (110.4 kg) 246 lb 4.1 oz (111.7 kg) 258 lb 13.1 oz (117.4 kg)    PHYSICAL EXAM   HEENT:  Normal  Neck: Supple without bruits or JVD. Lungs:  Resp regular and unlabored on vent. Clear anteriorly. Heart: RRR no s3, s4. Grade 2/6 systolic murmur at base. Abdomen: Soft, non-tender, non-distended, soft bowel sounds. Extremities: No clubbing, cyanosis or edema. DP/PT/Radials 2+ and equal bilaterally.  Accessory Clinical Findings  CBC  Recent Labs  10/02/14 1734  10/04/14 1700 10/05/14 0309  WBC 12.2*  < > 5.5 4.9    NEUTROABS 10.7*  --  4.5  --   HGB 10.3*  < > 7.7* 7.1*  HCT 30.0*  < > 22.6* 20.4*  MCV 89.0  < > 88.3 89.9  PLT 36*  < > 26* 27*  < > = values in this interval not displayed. Basic Metabolic Panel  Recent Labs  10/04/14 0345  10/04/14 1600 10/05/14 0223  NA 135*  < > 138 136*  K 5.3  < > 5.5* 4.9  CL 104  < > 106 103  CO2 20  < > 19 23  GLUCOSE 168*  < > 202* 162*  BUN 24*  < > 26* 25*  CREATININE 1.11  < > 1.68* 1.64*  CALCIUM 6.8*  < > 7.0* 7.2*  MG 2.0  --   --  2.1  PHOS 3.1  --  4.1 3.5  < > = values in this interval not displayed. Liver Function Tests  Recent Labs  10/02/14 1734  10/04/14 1600 10/05/14 0223  AST 44*  --   --   --   ALT 35  --   --   --   ALKPHOS 117  --   --   --   BILITOT  0.8  --   --   --   PROT 5.8*  --   --   --   ALBUMIN 3.1*  < > 2.4* 2.3*  < > = values in this interval not displayed. No results found for this basename: LIPASE, AMYLASE,  in the last 72 hours Cardiac Enzymes  Recent Labs  10/02/14 2200 10/03/14 0400  TROPONINI 1.86* 1.72*   BNP No components found with this basename: POCBNP,  D-Dimer No results found for this basename: DDIMER,  in the last 72 hours Hemoglobin A1C No results found for this basename: HGBA1C,  in the last 72 hours Fasting Lipid Panel No results found for this basename: CHOL, HDL, LDLCALC, TRIG, CHOLHDL, LDLDIRECT,  in the last 72 hours Thyroid Function Tests No results found for this basename: TSH, T4TOTAL, FREET3, T3FREE, THYROIDAB,  in the last 72 hours  TELE  NSR  ECG on 10/04/14:  Normal sinus rhythm Anterolateral infarct , age undetermined Abnormal ECG    Radiology/Studies  Ct Head Wo Contrast  10/02/2014   CLINICAL DATA:  53 year old male with history of trauma after after a fall during a syncopal episode during which the patient fell backward and struck his head. Status post CPR.  EXAM: CT HEAD WITHOUT CONTRAST  TECHNIQUE: Contiguous axial images were obtained from the base  of the skull through the vertex without intravenous contrast.  COMPARISON:  No priors.  FINDINGS: No acute displaced skull fractures are identified. No acute intracranial abnormality. Specifically, no evidence of acute post-traumatic intracranial hemorrhage, no definite regions of acute/subacute cerebral ischemia, no focal mass, mass effect, hydrocephalus or abnormal intra or extra-axial fluid collections. The visualized paranasal sinuses and mastoids are well pneumatized, with exception of a small air-fluid level in the posterior aspect of the left maxillary sinus and right sphenoid sinus.  IMPRESSION: 1. Small amount of soft tissue swelling in the occipital scalp, without underlying displaced skull fracture or acute intracranial abnormalities. 2. Small air-fluid level in the left maxillary sinus and right sphenoid sinus. This is nonspecific, but correlation for signs of sinusitis is recommended.   Electronically Signed   By: Trudie Reed M.D.   On: 10/02/2014 16:36   Dg Chest Port 1 View  10/04/2014   CLINICAL DATA:  Acute respiratory failure  EXAM: PORTABLE CHEST - 1 VIEW  COMPARISON:  Radiograph 10/03/2014  FINDINGS: Endotracheal tube, NG tube, right central venous line are unchanged. Stable cardiac silhouette. The is dense left basilar atelectasis unchanged. Mild right basilar airspace disease. No pneumothorax  IMPRESSION: 1. Stable support apparatus. 2. No change in dense left basilar atelectasis/airspace disease. 3. Mild airspace disease on the right. 4. Overall no interval change   Electronically Signed   By: Genevive Bi M.D.   On: 10/04/2014 07:22   Dg Chest Port 1 View  10/03/2014   CLINICAL DATA:  Respiratory failure/hypoxia  EXAM: PORTABLE CHEST - 1 VIEW  COMPARISON:  October 02, 2014  FINDINGS: Endotracheal tube tip is 2.7 cm above the carina. Nasogastric tube tip and side port are below the diaphragm. Central catheter tip is in the superior vena cava. The questionable trace  pneumothorax on the left seen 1 day prior is not appreciable on this examination. No pneumothorax is apparent currently.  There is persistent consolidation in the left lower lobe. Hazy opacity throughout much of the right lung is slightly less apparent currently. No new opacity is seen. Heart is enlarged with pulmonary vascularity within normal limits. No adenopathy.  IMPRESSION: Tube and  catheter positions as described without appreciable pneumothorax. Infiltrate bilaterally, more on the right than on the left, is again noted. There is slightly less consolidation on the right and no appreciable change in the left lower lobe. No new opacity.   Electronically Signed   By: Bretta BangWilliam  Woodruff M.D.   On: 10/03/2014 07:56   Dg Chest Port 1 View  10/02/2014   CLINICAL DATA:  Unsuccessful attempted left-sided central line placement. Subsequent encounter.  EXAM: PORTABLE CHEST - 1 VIEW  COMPARISON:  10/02/2014 (multiple examinations)  FINDINGS: Grossly unchanged enlarged cardiac silhouette and mediastinal contours. Stable positioning of support apparatus. Query trace left apical pneumothorax. Pulmonary vasculature remains indistinct with cephalization of flow with relative area of confluence about the right mid lung. Grossly unchanged left basilar/retrocardiac consolidative opacities. Trace of sided effusion is not excluded. Unchanged bones.  IMPRESSION: 1.  Stable positioning of support apparatus. 2. Query trace left apical pneumothorax. Continued attention on follow-up is recommended. 3. Similar findings of asymmetric pulmonary edema worse within the right mid and left lower lung. Critical Value/emergent results were called by telephone at the time of interpretation on 10/02/2014 at 10:23 pm to [facility] Surgery Center LLCDavis, [place]RN, who verbally acknowledged these results.   Electronically Signed   By: Simonne ComeJohn  Watts M.D.   On: 10/02/2014 22:24   Dg Chest Portable 1 View  10/02/2014   CLINICAL DATA:  Readjustment of endotracheal tube.  EXAM:  PORTABLE CHEST - 1 VIEW  COMPARISON:  Same day.  FINDINGS: Stable cardiomediastinal silhouette. Endotracheal tube is now seen with distal tip 3 cm above the carina in grossly good position. Right internal jugular catheter remains with distal tip overlying expected position of the SVC. Stable bilateral lung opacities are noted. No pneumothorax or pleural effusion is noted.  IMPRESSION: Stable bilateral lung opacities. Endotracheal tube in grossly good position.   Electronically Signed   By: Roque LiasJames  Green M.D.   On: 10/02/2014 17:04   Dg Chest Portable 1 View  10/02/2014   CLINICAL DATA:  Status post intubation and central line placement.  EXAM: PORTABLE CHEST - 1 VIEW  COMPARISON:  July 18, 2014.  FINDINGS: Right internal jugular catheter line is noted with distal tip in expected position of the SVC. Endotracheal tube is directed in the right mainstem bronchus. It is recommended at be withdrawn approximately 4 cm. Diffuse airspace opacities are noted in the right lung in left upper lobe, as well as large left retrocardiac opacity concerning for bilateral pneumonia. No pneumothorax or significant pleural effusion is noted. Bony thorax is intact.  IMPRESSION: Significantly increased bilateral lung opacities are noted concerning for pneumonia or possibly edema. Endotracheal tube is directed in the right mainstem bronchus; it is recommended to be withdrawn at least 4 cm. Critical Value/emergent results were called by telephone at the time of interpretation on 10/02/2014 at 4:15 pm to Dr. Zadie RhineNALD WICKLINE , who verbally acknowledged these results.   Electronically Signed   By: Roque LiasJames  Green M.D.   On: 10/02/2014 16:15   Dg Abd Portable 1v  10/02/2014   CLINICAL DATA:  OG tube insertion  EXAM: PORTABLE ABDOMEN - 1 VIEW  COMPARISON:  None.  FINDINGS: Enteric tube tip and side port projects over the expected location of the gastric fundus.  Paucity of bowel gas without definite evidence of obstruction. No supine  evidence of pneumoperitoneum. No definite pneumatosis or portal venous gas.  Limited visualization the lower thorax demonstrates enlargement of the cardiac silhouette and epicardial pacer pads overlying the left ventricular  apex.  IMPRESSION: Enteric tube tip and side port overlie the gastric fundus.   Electronically Signed   By: Simonne Come M.D.   On: 10/02/2014 22:29    ASSESSMENT AND PLAN  1. Cardiac arrest  - likely primary event in the setting of nonischemic cardiomyopathy  - ROSC after shocked x 2 by AED  - normally need cath, however ?cath candidate with severe thrombocytopenia.. Once patient stable, can consider coronary CT and EP consult for ICD for secondary prevention (depend on his prognosis and neurological recovery)  2. H/o severe ischemic cardiomyopathy  - EF 35% by echo, 19% by stress test.  Consider repeat echo after extubation.  - Stress test noted inferior and apex scar  3. Severe thrombocytopenia, 4. bicuspid aortic valve  5. H/o mild pericardial effusion on recent echo  6. h/o pancreatitis  7. H/o recent sepsis with biliary origin: refused by surgery to cholecystectomy due to perioperative risk with h/o cirrhosis  8. Cirrhosis  9. Obesity  10. DM  11.Junctional rhythm resolved with rewarming.  12. Anemia.  Plan: Continue supportive therapy. Plan for eventual ischemic workup and EP consult after respiratory and neurologic recovery. Signed, Cassell Clement MD

## 2014-10-05 NOTE — Progress Notes (Addendum)
20cc of versed wasted in sink; witnessed by 2 RNs. Shearon Balo, RN. Hazel Sams, RN.

## 2014-10-05 NOTE — Progress Notes (Signed)
Pts blood sugar was 259 at 1600, MD Belinda Block notified and ordered resistant sliding scale, 8 units of novolog was then given per MD order

## 2014-10-06 DIAGNOSIS — N179 Acute kidney failure, unspecified: Secondary | ICD-10-CM

## 2014-10-06 DIAGNOSIS — J9601 Acute respiratory failure with hypoxia: Secondary | ICD-10-CM

## 2014-10-06 DIAGNOSIS — D6489 Other specified anemias: Secondary | ICD-10-CM | POA: Diagnosis present

## 2014-10-06 LAB — CBC
HCT: 20.6 % — ABNORMAL LOW (ref 39.0–52.0)
HEMATOCRIT: 24.2 % — AB (ref 39.0–52.0)
Hemoglobin: 7 g/dL — ABNORMAL LOW (ref 13.0–17.0)
Hemoglobin: 8.2 g/dL — ABNORMAL LOW (ref 13.0–17.0)
MCH: 29.7 pg (ref 26.0–34.0)
MCH: 30 pg (ref 26.0–34.0)
MCHC: 34 g/dL (ref 30.0–36.0)
MCHC: 33.9 g/dL (ref 30.0–36.0)
MCV: 87.3 fL (ref 78.0–100.0)
MCV: 88.6 fL (ref 78.0–100.0)
PLATELETS: 18 10*3/uL — AB (ref 150–400)
Platelets: 17 10*3/uL — CL (ref 150–400)
RBC: 2.36 MIL/uL — AB (ref 4.22–5.81)
RBC: 2.73 MIL/uL — ABNORMAL LOW (ref 4.22–5.81)
RDW: 16.5 % — ABNORMAL HIGH (ref 11.5–15.5)
RDW: 17 % — ABNORMAL HIGH (ref 11.5–15.5)
WBC: 3.1 10*3/uL — ABNORMAL LOW (ref 4.0–10.5)
WBC: 3.4 10*3/uL — ABNORMAL LOW (ref 4.0–10.5)

## 2014-10-06 LAB — GLUCOSE, CAPILLARY
GLUCOSE-CAPILLARY: 212 mg/dL — AB (ref 70–99)
GLUCOSE-CAPILLARY: 173 mg/dL — AB (ref 70–99)
GLUCOSE-CAPILLARY: 136 mg/dL — AB (ref 70–99)
Glucose-Capillary: 155 mg/dL — ABNORMAL HIGH (ref 70–99)
Glucose-Capillary: 156 mg/dL — ABNORMAL HIGH (ref 70–99)
Glucose-Capillary: 198 mg/dL — ABNORMAL HIGH (ref 70–99)

## 2014-10-06 LAB — RENAL FUNCTION PANEL
Albumin: 2.1 g/dL — ABNORMAL LOW (ref 3.5–5.2)
Albumin: 2.3 g/dL — ABNORMAL LOW (ref 3.5–5.2)
Anion gap: 7 (ref 5–15)
Anion gap: 8 (ref 5–15)
BUN: 35 mg/dL — AB (ref 6–23)
BUN: 36 mg/dL — ABNORMAL HIGH (ref 6–23)
CHLORIDE: 105 meq/L (ref 96–112)
CHLORIDE: 104 meq/L (ref 96–112)
CO2: 27 mEq/L (ref 19–32)
CO2: 26 meq/L (ref 19–32)
CREATININE: 1.48 mg/dL — AB (ref 0.50–1.35)
CREATININE: 1.25 mg/dL (ref 0.50–1.35)
Calcium: 7.2 mg/dL — ABNORMAL LOW (ref 8.4–10.5)
Calcium: 7.5 mg/dL — ABNORMAL LOW (ref 8.4–10.5)
GFR calc Af Amer: 61 mL/min — ABNORMAL LOW (ref >90)
GFR calc Af Amer: 75 mL/min — ABNORMAL LOW (ref >90)
GFR, EST NON AFRICAN AMERICAN: 53 mL/min — AB (ref >90)
GFR, EST NON AFRICAN AMERICAN: 65 mL/min — AB (ref >90)
GLUCOSE: 235 mg/dL — AB (ref 70–99)
Glucose, Bld: 168 mg/dL — ABNORMAL HIGH (ref 70–99)
Phosphorus: 2.2 mg/dL — ABNORMAL LOW (ref 2.3–4.6)
Phosphorus: 2.5 mg/dL (ref 2.3–4.6)
Potassium: 5 mEq/L (ref 3.7–5.3)
Potassium: 5.1 mEq/L (ref 3.7–5.3)
Sodium: 139 mEq/L (ref 137–147)
Sodium: 138 mEq/L (ref 137–147)

## 2014-10-06 LAB — DIC (DISSEMINATED INTRAVASCULAR COAGULATION) PANEL
APTT: 31 s (ref 24–37)
D DIMER QUANT: 14.84 ug{FEU}/mL — AB (ref 0.00–0.48)
INR: 1.18 (ref 0.00–1.49)
PLATELETS: 18 10*3/uL — AB (ref 150–400)

## 2014-10-06 LAB — LACTATE DEHYDROGENASE: LDH: 370 U/L — ABNORMAL HIGH (ref 94–250)

## 2014-10-06 LAB — MAGNESIUM: Magnesium: 2.3 mg/dL (ref 1.5–2.5)

## 2014-10-06 LAB — PREPARE RBC (CROSSMATCH)

## 2014-10-06 LAB — DIC (DISSEMINATED INTRAVASCULAR COAGULATION)PANEL
Fibrinogen: 344 mg/dL (ref 204–475)
Prothrombin Time: 15.2 seconds (ref 11.6–15.2)
Smear Review: NONE SEEN

## 2014-10-06 MED ORDER — ONDANSETRON HCL 4 MG/2ML IJ SOLN
INTRAMUSCULAR | Status: AC
  Filled 2014-10-06: qty 2

## 2014-10-06 MED ORDER — SODIUM CHLORIDE 0.9 % IV SOLN
Freq: Once | INTRAVENOUS | Status: AC
  Administered 2014-10-06: 09:00:00 via INTRAVENOUS

## 2014-10-06 MED ORDER — PANTOPRAZOLE SODIUM 40 MG IV SOLR
40.0000 mg | Freq: Two times a day (BID) | INTRAVENOUS | Status: DC
  Administered 2014-10-06 – 2014-10-09 (×8): 40 mg via INTRAVENOUS
  Filled 2014-10-06 (×10): qty 40

## 2014-10-06 MED ORDER — ONDANSETRON HCL 4 MG/2ML IJ SOLN
4.0000 mg | Freq: Four times a day (QID) | INTRAMUSCULAR | Status: DC | PRN
  Administered 2014-10-06 – 2014-10-07 (×3): 4 mg via INTRAVENOUS
  Filled 2014-10-06 (×2): qty 2

## 2014-10-06 MED ORDER — FENTANYL CITRATE 0.05 MG/ML IJ SOLN
12.5000 ug | INTRAMUSCULAR | Status: DC | PRN
  Administered 2014-10-10: 25 ug via INTRAVENOUS
  Administered 2014-10-10: 12.5 ug via INTRAVENOUS
  Filled 2014-10-06 (×2): qty 2

## 2014-10-06 MED ORDER — SODIUM CHLORIDE 0.9 % IV SOLN
INTRAVENOUS | Status: DC
  Administered 2014-10-09: 10:00:00 via INTRAVENOUS

## 2014-10-06 NOTE — Progress Notes (Signed)
EKG CRITICAL VALUE     12 lead EKG performed.  Critical value noted. Malachi Pro, RN notified.   Josten Warmuth C, CCT 10/06/2014 7:11 AM

## 2014-10-06 NOTE — Progress Notes (Signed)
   Renal Attending: Stable, now extubated. Will continue CDVVHD for now as filter just changed.  Getting PRBCs.  If clots will not replace filter.  Will continue to not remove fluid and allow CVP to drip upward.  Making some urine and following commands. ACP

## 2014-10-06 NOTE — Progress Notes (Signed)
Patient Name: [name] Date of Encounter: 10/06/2014     Active Problems:   Respiratory failure   Cardiac arrest   Cardiogenic shock    SUBJECTIVE  Remains on vent. Follows commands. Pressor support being cut back. Rhythm remains NSR.  CURRENT MEDS . sodium chloride   Intravenous Once  . ampicillin-sulbactam (UNASYN) IV  1.5 g Intravenous Q6H  . antiseptic oral rinse  7 mL Mouth Rinse QID  . chlorhexidine  15 mL Mouth Rinse BID  . feeding supplement (PRO-STAT SUGAR FREE 64)  30 mL Per Tube 5 X Daily  . feeding supplement (VITAL HIGH PROTEIN)  1,000 mL Per Tube Q24H  . fentaNYL  50 mcg Intravenous Once  . insulin aspart  0-15 Units Subcutaneous 6 times per day  . insulin glargine  10 Units Subcutaneous Daily  . pantoprazole (PROTONIX) IV  40 mg Intravenous Q24H  . sodium chloride  10-40 mL Intracatheter Q12H    OBJECTIVE  Filed Vitals:   10/06/14 0345 10/06/14 0400 10/06/14 0500 10/06/14 0600  BP:    95/40  Pulse:  85 86 84  Temp:  98.4 F (36.9 C) 98.2 F (36.8 C) 98.1 F (36.7 C)  TempSrc:      Resp:  17 16 17   Height:      Weight: 262 lb 5.6 oz (119 kg)     SpO2:  100% 100% 100%    Intake/Output Summary (Last 24 hours) at 10/06/14 0750 Last data filed at 10/06/14 0700  Gross per 24 hour  Intake 2598.75 ml  Output    544 ml  Net 2054.75 ml   Filed Weights   10/04/14 0550 10/05/14 0500 10/06/14 0345  Weight: 246 lb 4.1 oz (111.7 kg) 258 lb 13.1 oz (117.4 kg) 262 lb 5.6 oz (119 kg)    PHYSICAL EXAM   HEENT:  Normal  Neck: Supple without bruits or JVD. Lungs:  Resp regular and unlabored on vent. Clear anteriorly. Heart: RRR no s3, s4. Grade 2/6 systolic murmur at base. Abdomen: Soft, non-tender, non-distended, soft bowel sounds. Extremities: No clubbing, cyanosis or edema. DP/PT/Radials 2+ and equal bilaterally. Neuro:  Awake and alert    Accessory Clinical Findings  CBC  Recent Labs  10/04/14 1700  10/05/14 2210 10/06/14 0415    WBC 5.5  < > 3.3* 3.1*  NEUTROABS 4.5  --   --   --   HGB 7.7*  < > 7.1* 7.0*  HCT 22.6*  < > 21.4* 20.6*  MCV 88.3  < > 89.5 87.3  PLT 26*  < > 16* 18*  < > = values in this interval not displayed. Basic Metabolic Panel  Recent Labs  10/05/14 0223 10/05/14 1550 10/06/14 0415  NA 136* 133* 139  K 4.9 5.0 5.0  CL 103 102 105  CO2 23 25 27   GLUCOSE 162* 262* 235*  BUN 25* 30* 35*  CREATININE 1.64* 1.57* 1.48*  CALCIUM 7.2* 7.0* 7.2*  MG 2.1  --  2.3  PHOS 3.5 2.8 2.2*   Liver Function Tests  Recent Labs  10/05/14 1550 10/06/14 0415  ALBUMIN 2.1* 2.1*   No results found for this basename: LIPASE, AMYLASE,  in the last 72 hours Cardiac Enzymes No results found for this basename: CKTOTAL, CKMB, CKMBINDEX, TROPONINI,  in the last 72 hours BNP No components found with this basename: POCBNP,  D-Dimer No results found for this basename: DDIMER,  in the last 72 hours Hemoglobin A1C No results found for  this basename: HGBA1C,  in the last 72 hours Fasting Lipid Panel No results found for this basename: CHOL, HDL, LDLCALC, TRIG, CHOLHDL, LDLDIRECT,  in the last 72 hours Thyroid Function Tests No results found for this basename: TSH, T4TOTAL, FREET3, T3FREE, THYROIDAB,  in the last 72 hours  TELE  NSR  ECG on 09/1914:  Normal sinus rhythm Anterolateral infarct , age undetermined Abnormal ECG   ASSESSMENT AND PLAN  1. Cardiac arrest  - likely primary event in the setting of nonischemic cardiomyopathy  S/p Artic Sun protocol.   Is awake and alert - ROSC after shocked x 2 by AED  - normally need cath, however ?cath candidate with severe thrombocytopenia.. Once patient stable, can consider coronary CT and EP consult for ICD for secondary prevention   2. H/o severe ischemic cardiomyopathy  - EF 35% by echo, 19% by stress test.  Consider repeat echo after extubation.  - Stress test noted inferior and apex scar   3. Severe thrombocytopenia, 4. bicuspid aortic  valve  5. H/o mild pericardial effusion on recent echo  6. h/o pancreatitis  7. H/o recent sepsis with biliary origin: refused by surgery to cholecystectomy due to perioperative risk with h/o cirrhosis  8. Cirrhosis  9. Obesity  10. DM  11.Junctional rhythm resolved with rewarming.  12. Anemia.  Plan: Continue supportive therapy. Plan for eventual ischemic workup and EP consult after respiratory and neurologic recovery.   Vesta MixerPhilip J. Jolane Bankhead, Montez HagemanJr., MD, [facility] 10/06/2014, 7:56 AM [address],  [address] Office [phone] Pager [phone]

## 2014-10-06 NOTE — Progress Notes (Signed)
Patient ID: [name], male   DOB: 08/06/1961, 53 y.o.   MRN: [id number] S: No longer intubated, follows commands, difficulty with speech  O:BP 95/40  Pulse 84  Temp(Src) 98.1 F (36.7 C) (Oral)  Resp 17  Ht 5\' 10"  (1.778 m)  Wt 262 lb 5.6 oz (119 kg)  BMI 37.64 kg/m2  SpO2 100%  Intake/Output Summary (Last 24 hours) at 10/06/14 0854 Last data filed at 10/06/14 0700  Gross per 24 hour  Intake 2501.77 ml  Output    470 ml  Net 2031.77 ml   Intake/Output: I/O last 3 completed shifts: In: 4114 [I.V.:1195; Blood:670; NG/GT:1949; IV Piggyback:300] Out: 617 [Urine:383; Other:234]  Intake/Output this shift:    Weight change: 3 lb 8.4 oz (1.6 kg)  Gen: NAD, appears comfortable Cardio: RRR, no m/r/g Resp: CTAB Abd: soft, no bowel sounds Ext: no edema, cool feet. 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   Recent Labs Lab 10/02/14 1734  10/03/14 0425  10/03/14 1630  10/04/14 0345 10/04/14 0800 10/04/14 1152 10/04/14 1600 10/05/14 0223 10/05/14 1550 10/06/14 0415  NA 135*  < > 134*  < > 135*  < > 135* 134* 133* 138 136* 133* 139  K 6.9*  < > 4.7  < > 5.7*  < > 5.3 5.2 5.6* 5.5* 4.9 5.0 5.0  CL 106  < > 102  < > 103  < > 104 103 102 106 103 102 105  CO2 19  < > 17*  --  20  < > 20 20 20 19 23 25 27   GLUCOSE 183*  < > 235*  < > 160*  < > 168* 173* 201* 202* 162* 262* 235*  BUN 41*  < > 36*  < > 29*  < > 24* 24* 24* 26* 25* 30* 35*  CREATININE 1.38*  < > 1.24  < > 1.20  < > 1.11 1.17 1.48* 1.68* 1.64* 1.57* 1.48*  ALBUMIN 3.1*  --  3.1*  --  2.7*  --  2.4*  --   --  2.4* 2.3* 2.1* 2.1*  CALCIUM 7.5*  < > 7.8*  --  7.2*  < > 6.8* 6.6* 7.0* 7.0* 7.2* 7.0* 7.2*  PHOS  --   --  2.7  --  3.0  --  3.1  --   --  4.1 3.5 2.8 2.2*  AST 44*  --   --   --   --   --   --   --   --   --   --   --   --   ALT 35  --   --   --   --   --   --   --   --   --   --   --   --   < > = values in this interval not displayed. Liver Function Tests:  Recent Labs Lab 10/02/14 1734   10/05/14 0223 10/05/14 1550 10/06/14 0415  AST 44*  --   --   --   --   ALT 35  --   --   --   --   ALKPHOS 117  --   --   --   --   BILITOT 0.8  --   --   --   --   PROT 5.8*  --   --   --   --   ALBUMIN 3.1*  < > 2.3* 2.1* 2.1*  < > =  values in this interval not displayed. No results found for this basename: LIPASE, AMYLASE,  in the last 168 hours No results found for this basename: AMMONIA,  in the last 168 hours CBC:  Recent Labs Lab 10/02/14 1734  10/04/14 1700 10/05/14 0309 10/05/14 1430 10/05/14 2210 10/06/14 0415  WBC 12.2*  < > 5.5 4.9 3.2* 3.3* 3.1*  NEUTROABS 10.7*  --  4.5  --   --   --   --   HGB 10.3*  < > 7.7* 7.1* 7.0* 7.1* 7.0*  HCT 30.0*  < > 22.6* 20.4* 20.6* 21.4* 20.6*  MCV 89.0  < > 88.3 89.9 87.7 89.5 87.3  PLT 36*  < > 26* 27* 19* 16* 18*  < > = values in this interval not displayed. Cardiac Enzymes:  Recent Labs Lab 10/02/14 2200 10/03/14 0400  TROPONINI 1.86* 1.72*   CBG:  Recent Labs Lab 10/05/14 1105 10/05/14 1544 10/05/14 1948 10/05/14 2353 10/06/14 0330  GLUCAP 263* 259* 204* 256* 212*    Iron Studies: No results found for this basename: IRON, TIBC, TRANSFERRIN, FERRITIN,  in the last 72 hours  Assessment/Plan:  1. ARF- in setting of cardiac arrest while on lisinopril. Started on CVVHD 10/15, tolerating well; if clots would discontinue and see how he does overnight. CVP 10. Initially oliguric now producing some urine.  2. Hyperkalemia- Resolved with CVVHD, now 5.0. 3. Anemia- normocytic.  S/p 1u 10/17 and 2u 10/18. No response/still downtrending with hgb 7.0 this AM... ?GI bleed 4. Hypocalcemia - repleted 10/17 with 1g calcium gluconate IV, now 7.2 (corrects to 8.6) 5. Cardiac arrest- on amio and cardiology following 6. VDRF- per PCCM 7. SIRS- hypovolemic- will keep + with IVF's, off pressors 10/19 8. DM- per PCCM 9. Cirrhosis 10. Thrombocytopenia - stable at [address]18  Raesean Bartoletti 10/06/2014, 8:54 AM

## 2014-10-06 NOTE — Plan of Care (Signed)
Problem: Phase I Progression Outcomes Goal: Other Phase I Outcomes/Goals Outcome: Progressing Pt receiving blood for hgb 7  Problem: Phase II Progression Outcomes Goal: Dialysis initiated Outcome: Progressing CRRT ongoing with patient

## 2014-10-06 NOTE — Plan of Care (Signed)
Problem: Phase II Progression Outcomes Goal: Dialysis initiated Outcome: Progressing CRRT ongoing

## 2014-10-06 NOTE — Progress Notes (Signed)
PULMONARY / CRITICAL CARE MEDICINE   Name: Ralph BuckerRobert A Walker MRN: [id number] DOB: 1960/12/21    ADMISSION DATE:  10/02/2014  REFERRING MD :  EDP  CHIEF COMPLAINT:  Post arrest   INITIAL PRESENTATION: 53yo male with hx non - ischemic cardiomyopathy EF 35-40%, cirrhosis, DM, cholelithiasis presented 10/15 after arrest at work with EMS trained bystander CPR/defib. Pt suddenly collapsed, fell backward and hit his head.  AED advised shockable rhythm. Approx 10 minutes CPR with ROSC before EMS arrival.  PCCM called to admit.    STUDIES:  CT head 10/15>>>neg, Small air-fluid level in the left maxillary sinus and right sphenoid sinus    SIGNIFICANT EVENTS: 10/15 hyperkalemia (on aldactone & lisinopril) requiring CRRT 10/03/14 : : Cooled to 33  Bradycardic , on max pressors Anuric Sedated , paralysed On CRRT 10/04/14 On CRRT for high K., on pressors., Just rewarmed: following all commands, received 1 U PRBC 10/18 still on pressors, received 2 U PRBC 10/19 > passing SBT, off all pressors, following commands    SUBJECTIVE/OVERNIGHT/INTERVAL HX Following commands, off pressors, good cough/gag    VITAL SIGNS: Temp:  [98.1 F (36.7 C)-99 F (37.2 C)] 98.1 F (36.7 C) (10/19 0600) Pulse Rate:  [84-96] 84 (10/19 0600) Resp:  [8-25] 17 (10/19 0600) BP: (82-102)/(36-71) 95/40 mmHg (10/19 0600) SpO2:  [100 %] 100 % (10/19 0600) Arterial Line BP: (92-153)/(36-53) 101/45 mmHg (10/19 0600) FiO2 (%):  [40 %] 40 % (10/19 0400) Weight:  [119 kg (262 lb 5.6 oz)] 119 kg (262 lb 5.6 oz) (10/19 0345) HEMODYNAMICS: CVP:  [7 mmHg-13 mmHg] 10 mmHg VENTILATOR SETTINGS: Vent Mode:  [-] PRVC FiO2 (%):  [40 %] 40 % Set Rate:  [18 bmp] 18 bmp Vt Set:  [500 mL] 500 mL PEEP:  [5 cmH20] 5 cmH20 Plateau Pressure:  [11 cmH20-20 cmH20] 18 cmH20 INTAKE / OUTPUT:  Intake/Output Summary (Last 24 hours) at 10/06/14 0842 Last data filed at 10/06/14 0700  Gross per 24 hour  Intake 2501.77 ml  Output     470 ml  Net 2031.77 ml    PHYSICAL EXAMINATION: General:  Awake on vent, intubated HEENT: NCAT, EOMi PULM: CTA B CV: RRR, no mgr Ab: BS+, soft Ext: anasarca/edema throughout Derm: bruising L hand noted Neuro: Awake and alert, following commands, writing sentances, maew  LABS:  PULMONARY  Recent Labs Lab 10/02/14 1717  10/02/14 2156 10/02/14 2258 10/03/14 0034 10/03/14 0836 10/03/14 1132 10/05/14 0357  PHART 7.238*  --  7.259*  --   --   --   --  7.334*  PCO2ART 48.3*  --  40.0  --   --   --   --  40.6  PO2ART 207.0*  --  282.0*  --   --   --   --  145.0*  HCO3 21.4  --  17.3*  --   --   --   --  21.1  TCO2 23  < > 18.5 19 18 20 21  22.4  O2SAT 100.0  --  100.0  --   --   --   --  99.8  < > = values in this interval not displayed.  CBC  Recent Labs Lab 10/05/14 1430 10/05/14 2210 10/06/14 0415  HGB 7.0* 7.1* 7.0*  HCT 20.6* 21.4* 20.6*  WBC 3.2* 3.3* 3.1*  PLT 19* 16* 18*    COAGULATION  Recent Labs Lab 10/02/14 1700 10/02/14 2344  INR 1.30 1.33    CARDIAC    Recent Labs Lab 10/02/14  2200 10/03/14 0400  TROPONINI 1.86* 1.72*   No results found for this basename: PROBNP,  in the last 168 hours   CHEMISTRY  Recent Labs Lab 10/02/14 1734  10/03/14 0500  10/04/14 0345  10/04/14 1152 10/04/14 1600 10/05/14 0223 10/05/14 1550 10/06/14 0415  NA 135*  < >  --   < > 135*  < > 133* 138 136* 133* 139  K 6.9*  < >  --   < > 5.3  < > 5.6* 5.5* 4.9 5.0 5.0  CL 106  < >  --   < > 104  < > 102 106 103 102 105  CO2 19  < >  --   < > 20  < > 20 19 23 25 27   GLUCOSE 183*  < >  --   < > 168*  < > 201* 202* 162* 262* 235*  BUN 41*  < >  --   < > 24*  < > 24* 26* 25* 30* 35*  CREATININE 1.38*  < >  --   < > 1.11  < > 1.48* 1.68* 1.64* 1.57* 1.48*  CALCIUM 7.5*  < >  --   < > 6.8*  < > 7.0* 7.0* 7.2* 7.0* 7.2*  MG 2.2  --  2.2  --  2.0  --   --   --  2.1  --  2.3  PHOS  --   < >  --   < > 3.1  --   --  4.1 3.5 2.8 2.2*  < > = values in this interval  not displayed. Estimated Creatinine Clearance: 75.5 ml/min (by C-G formula based on Cr of 1.48).   LIVER  Recent Labs Lab 10/02/14 1700 10/02/14 1734 10/02/14 2344  10/04/14 0345 10/04/14 1600 10/05/14 0223 10/05/14 1550 10/06/14 0415  AST  --  44*  --   --   --   --   --   --   --   ALT  --  35  --   --   --   --   --   --   --   ALKPHOS  --  117  --   --   --   --   --   --   --   BILITOT  --  0.8  --   --   --   --   --   --   --   PROT  --  5.8*  --   --   --   --   --   --   --   ALBUMIN  --  3.1*  --   < > 2.4* 2.4* 2.3* 2.1* 2.1*  INR 1.30  --  1.33  --   --   --   --   --   --   < > = values in this interval not displayed.   INFECTIOUS No results found for this basename: LATICACIDVEN, PROCALCITON,  in the last 168 hours   ENDOCRINE CBG (last 3)   Recent Labs  10/05/14 1948 10/05/14 2353 10/06/14 0330  GLUCAP 204* 256* 212*         IMAGING x48h Dg Chest Port 1 View  10/05/2014   CLINICAL DATA:  Acute respiratory disease.  History of diabetes  EXAM: PORTABLE CHEST - 1 VIEW  COMPARISON:  10/04/2014  FINDINGS: There is a a right IJ catheter with tip in the projection of the SVC. The endotracheal tube  tip is just above the carina. Nasogastric tube tip is in the stomach. The heart size appears enlarged. No change and suspected left pleural effusion with overlying atelectasis/consolidation. Right lung is clear.  IMPRESSION: 1. Stable support apparatus. 2. No change in aeration to the left base.   Electronically Signed   By: Signa Kell M.D.   On: 10/05/2014 08:42        ASSESSMENT / PLAN:  PULMONARY OETT 10/15>>>10/19 Acute respiratory failure, post arrest Apical left pneumothx noted on CXR 10/15, resolved on FU CXR 10/16 and 10/04/14   P:   Extubate this morning Oral care SLP eval prior to eating   CARDIOVASCULAR CVL R IJ CVL 10/15>>> Cardiac arrest - shockable rhythm per AED, ~10 mins CPR Hx ischemic cardiomyopathy - EF 35-40% per echo  07/2014, but 19% per nuclear study Hx HTN Cardiogenic shock > resolved Bradycardia, junctional   - resolved   P:  Anticoagulation per cardiology Further EP eval per cardiology Will need AICD if survives this   RENAL Hyperkalemia on admit -related to lisinopril/aldactone -resolved AKI - oliguric, post arrest P:   Continue CVVHD per renal Could likely tolerate some volume removal and would even be OK to use levophed from my standpoint  GASTROINTESTINAL Hx cholelithiasis -deemed not to be a surgical candidate for cholecystectomy recently.  Hx cirrhosis - ?NASH v ETOH -not felt to be a surgical liver transplant candidate both due to multiple comorbidites  P:   Repeat LFT Repeat coag PPI to BID until occult blood back NPO until SLP eval   HEMATOLOGIC Severe thrombocytopenia due to acute illness and cirrhosis Anemia> no clear source of bleeding  P:  PRBC for goal hgb > 8gm% ; 1 unit again 10/19 Occult blood now Check DIC panel, LDH, Haptoglobin Consider CT abdomen if occult blood negative If clear evidence of bleeding then give PLT Repeat CBC 1600 today  INFECTIOUS No clear infection 10/16 blood > neg 10/16 unasyn > 10/19 P:   Monitor wbc, fever curve D/c unasyn  ENDOCRINE Hx DM    P:   Continue SSI q4, then change to AC/HS if eating  NEUROLOGIC Acute encephalopathy- post cardiac arrest  > resolved Fall    - following commands on sedation gtt on fentanyl and versed  P:   Prn fentanyl for pain after extbubation   FAMILY Family updates: 10/15 at bedside in ER. None at bedside 10/04/14 through 10/06/2014   SUMMARY  - extubate today, look for source of bleeding, ? GI bleed?  The patient is critically ill with multiple organ systems failure and requires high complexity decision making for assessment and support, frequent evaluation and titration of therapies, application of advanced monitoring technologies and extensive interpretation of multiple databases.    Critical Care Time devoted to patient care services described in this note is  35  Minutes.   Heber , MD [place] [facility] Pager: [phone] Cell: [phone] If no response, call [phone]  10/06/2014 8:42 AM

## 2014-10-06 NOTE — Progress Notes (Addendum)
Wasted Fentanyl with two RNs per protocol. C rice RN/ R. CullomRN

## 2014-10-06 NOTE — Procedures (Signed)
Extubation Procedure Note  Patient Details:   Name: [name] DOB: [birth year] MRN: [id number]   Airway Documentation:     Evaluation  O2 sats: stable throughout Complications: No apparent complications Patient did tolerate procedure well. Bilateral Breath Sounds: Diminished Suctioning: Airway Yes, Placed on 4L Petros   Toula Moos 10/06/2014, 9:53 AM

## 2014-10-07 ENCOUNTER — Inpatient Hospital Stay (HOSPITAL_COMMUNITY): Payer: 59

## 2014-10-07 DIAGNOSIS — K7460 Unspecified cirrhosis of liver: Secondary | ICD-10-CM

## 2014-10-07 LAB — TYPE AND SCREEN
ABO/RH(D): B POS
ANTIBODY SCREEN: NEGATIVE
UNIT DIVISION: 0
UNIT DIVISION: 0
Unit division: 0
Unit division: 0

## 2014-10-07 LAB — CBC WITH DIFFERENTIAL/PLATELET
BASOS ABS: 0 10*3/uL (ref 0.0–0.1)
BASOS PCT: 1 % (ref 0–1)
EOS ABS: 0 10*3/uL (ref 0.0–0.7)
EOS PCT: 1 % (ref 0–5)
HEMATOCRIT: 23.8 % — AB (ref 39.0–52.0)
Hemoglobin: 8.1 g/dL — ABNORMAL LOW (ref 13.0–17.0)
Lymphocytes Relative: 11 % — ABNORMAL LOW (ref 12–46)
Lymphs Abs: 0.4 10*3/uL — ABNORMAL LOW (ref 0.7–4.0)
MCH: 30.3 pg (ref 26.0–34.0)
MCHC: 34 g/dL (ref 30.0–36.0)
MCV: 89.1 fL (ref 78.0–100.0)
MONO ABS: 0.5 10*3/uL (ref 0.1–1.0)
Monocytes Relative: 13 % — ABNORMAL HIGH (ref 3–12)
Neutro Abs: 2.5 10*3/uL (ref 1.7–7.7)
Neutrophils Relative %: 74 % (ref 43–77)
Platelets: 19 10*3/uL — CL (ref 150–400)
RBC: 2.67 MIL/uL — ABNORMAL LOW (ref 4.22–5.81)
RDW: 17.6 % — AB (ref 11.5–15.5)
WBC: 3.4 10*3/uL — ABNORMAL LOW (ref 4.0–10.5)

## 2014-10-07 LAB — HAPTOGLOBIN: Haptoglobin: <25 mg/dL — ABNORMAL LOW (ref 45–215)

## 2014-10-07 LAB — RENAL FUNCTION PANEL
ALBUMIN: 2.3 g/dL — AB (ref 3.5–5.2)
Albumin: 2.2 g/dL — ABNORMAL LOW (ref 3.5–5.2)
Anion gap: 11 (ref 5–15)
Anion gap: 11 (ref 5–15)
BUN: 37 mg/dL — ABNORMAL HIGH (ref 6–23)
BUN: 45 mg/dL — ABNORMAL HIGH (ref 6–23)
CALCIUM: 7.7 mg/dL — AB (ref 8.4–10.5)
CHLORIDE: 101 meq/L (ref 96–112)
CO2: 25 mEq/L (ref 19–32)
CO2: 25 mEq/L (ref 19–32)
Calcium: 7.8 mg/dL — ABNORMAL LOW (ref 8.4–10.5)
Chloride: 101 mEq/L (ref 96–112)
Creatinine, Ser: 1.31 mg/dL (ref 0.50–1.35)
Creatinine, Ser: 1.53 mg/dL — ABNORMAL HIGH (ref 0.50–1.35)
GFR calc Af Amer: 59 mL/min — ABNORMAL LOW (ref >90)
GFR, EST AFRICAN AMERICAN: 71 mL/min — AB (ref >90)
GFR, EST NON AFRICAN AMERICAN: 61 mL/min — AB (ref >90)
GFR, EST NON AFRICAN AMERICAN: 51 mL/min — AB (ref >90)
Glucose, Bld: 147 mg/dL — ABNORMAL HIGH (ref 70–99)
Glucose, Bld: 160 mg/dL — ABNORMAL HIGH (ref 70–99)
Phosphorus: 2.8 mg/dL (ref 2.3–4.6)
Phosphorus: 3 mg/dL (ref 2.3–4.6)
Potassium: 4.7 mEq/L (ref 3.7–5.3)
Potassium: 4.6 mEq/L (ref 3.7–5.3)
SODIUM: 137 meq/L (ref 137–147)
Sodium: 137 mEq/L (ref 137–147)

## 2014-10-07 LAB — GLUCOSE, CAPILLARY
GLUCOSE-CAPILLARY: 141 mg/dL — AB (ref 70–99)
GLUCOSE-CAPILLARY: 121 mg/dL — AB (ref 70–99)
GLUCOSE-CAPILLARY: 148 mg/dL — AB (ref 70–99)
Glucose-Capillary: 143 mg/dL — ABNORMAL HIGH (ref 70–99)
Glucose-Capillary: 151 mg/dL — ABNORMAL HIGH (ref 70–99)

## 2014-10-07 LAB — HEPATIC FUNCTION PANEL
ALT: 44 U/L (ref 0–53)
AST: 67 U/L — AB (ref 0–37)
Albumin: 2.2 g/dL — ABNORMAL LOW (ref 3.5–5.2)
Alkaline Phosphatase: 112 U/L (ref 39–117)
BILIRUBIN DIRECT: 0.7 mg/dL — AB (ref 0.0–0.3)
BILIRUBIN INDIRECT: 0.7 mg/dL (ref 0.3–0.9)
Total Bilirubin: 1.4 mg/dL — ABNORMAL HIGH (ref 0.3–1.2)
Total Protein: 4.8 g/dL — ABNORMAL LOW (ref 6.0–8.3)

## 2014-10-07 LAB — PLATELET COUNT: Platelets: 20 10*3/uL — CL (ref 150–400)

## 2014-10-07 LAB — APTT: APTT: 28 s (ref 24–37)

## 2014-10-07 LAB — MAGNESIUM: Magnesium: 2.5 mg/dL (ref 1.5–2.5)

## 2014-10-07 LAB — PROTIME-INR
INR: 1.16 (ref 0.00–1.49)
PROTHROMBIN TIME: 15 s (ref 11.6–15.2)

## 2014-10-07 MED ORDER — FUROSEMIDE 10 MG/ML IJ SOLN
100.0000 mg | Freq: Two times a day (BID) | INTRAVENOUS | Status: AC
  Administered 2014-10-07 (×2): 100 mg via INTRAVENOUS
  Filled 2014-10-07 (×3): qty 10

## 2014-10-07 MED ORDER — HEPARIN SODIUM (PORCINE) 1000 UNIT/ML IJ SOLN
1.4000 mL | Freq: Once | INTRAMUSCULAR | Status: AC
  Administered 2014-10-07: 2800 [IU] via INTRAVENOUS
  Filled 2014-10-07: qty 1.4
  Filled 2014-10-07: qty 2

## 2014-10-07 MED ORDER — INSULIN ASPART 100 UNIT/ML ~~LOC~~ SOLN: 0.0000 [IU] | Freq: Three times a day (TID) | SUBCUTANEOUS | Status: DC

## 2014-10-07 MED ORDER — CETYLPYRIDINIUM CHLORIDE 0.05 % MT LIQD
7.0000 mL | Freq: Two times a day (BID) | OROMUCOSAL | Status: DC
  Administered 2014-10-07: 7 mL via OROMUCOSAL

## 2014-10-07 MED ORDER — ENSURE COMPLETE PO LIQD
237.0000 mL | Freq: Two times a day (BID) | ORAL | Status: DC
  Administered 2014-10-08 – 2014-10-14 (×9): 237 mL via ORAL
  Filled 2014-10-07 (×5): qty 237

## 2014-10-07 NOTE — Progress Notes (Addendum)
Patient Name: [name] Date of Encounter: 10/07/2014     Principal Problem:   Cardiogenic shock Active Problems:   Cardiac arrest   Anemia due to other cause   Acute respiratory failure with hypoxia   AKI (acute kidney injury)    SUBJECTIVE  Patient has been extubated. He still having lots of nausea. He has not had a bowel movement in several days.  CURRENT MEDS . sodium chloride   Intravenous Once  . antiseptic oral rinse  7 mL Mouth Rinse QID  . chlorhexidine  15 mL Mouth Rinse BID  . insulin aspart  0-15 Units Subcutaneous 6 times per day  . insulin glargine  10 Units Subcutaneous Daily  . pantoprazole (PROTONIX) IV  40 mg Intravenous Q12H  . sodium chloride  10-40 mL Intracatheter Q12H    OBJECTIVE  Filed Vitals:   10/07/14 0400 10/07/14 0500 10/07/14 0600 10/07/14 0700  BP:      Pulse: 87 86 87 86  Temp: 98.1 F (36.7 C) 97.9 F (36.6 C) 97.9 F (36.6 C) 97.9 F (36.6 C)  TempSrc:      Resp: 20 16 17 28   Height:      Weight:      SpO2: 100% 100% 98% 100%    Intake/Output Summary (Last 24 hours) at 10/07/14 0802 Last data filed at 10/07/14 0700  Gross per 24 hour  Intake    860 ml  Output    296 ml  Net    564 ml   Filed Weights   10/04/14 0550 10/05/14 0500 10/06/14 0345  Weight: 246 lb 4.1 oz (111.7 kg) 258 lb 13.1 oz (117.4 kg) 262 lb 5.6 oz (119 kg)    PHYSICAL EXAM   HEENT:  Normal  Neck: Supple without bruits or JVD. Lungs:  Resp regular.    Clear anteriorly. Heart: RRR no s3, s4. Grade 2/6 systolic murmur at base. Abdomen:   Mild distention Extremities: No clubbing, cyanosis.  + anasarca .  DP/PT/Radials 2+ and equal bilaterally. Neuro:  Awake and alert    Accessory Clinical Findings  CBC  Recent Labs  10/04/14 1700  10/06/14 1500 10/07/14 0442  WBC 5.5  < > 3.4* 3.4*  NEUTROABS 4.5  --   --  2.5  HGB 7.7*  < > 8.2* 8.1*  HCT 22.6*  < > 24.2* 23.8*  MCV 88.3  < > 88.6 89.1  PLT 26*  < > 17*  18* 19*  < > =  values in this interval not displayed. Basic Metabolic Panel  Recent Labs  10/06/14 0415 10/06/14 1500 10/07/14 0442 10/07/14 0450  NA 139 138  --  137  K 5.0 5.1  --  4.7  CL 105 104  --  101  CO2 27 26  --  25  GLUCOSE 235* 168*  --  147*  BUN 35* 36*  --  37*  CREATININE 1.48* 1.25  --  1.31  CALCIUM 7.2* 7.5*  --  7.7*  MG 2.3  --  2.5  --   PHOS 2.2* 2.5  --  2.8   Liver Function Tests  Recent Labs  10/07/14 0442 10/07/14 0450  AST 67*  --   ALT 44  --   ALKPHOS 112  --   BILITOT 1.4*  --   PROT 4.8*  --   ALBUMIN 2.2* 2.2*   No results found for this basename: LIPASE, AMYLASE,  in the last 72 hours Cardiac Enzymes No results  found for this basename: CKTOTAL, CKMB, CKMBINDEX, TROPONINI,  in the last 72 hours BNP No components found with this basename: POCBNP,  D-Dimer  Recent Labs  10/06/14 1500  DDIMER 14.84*   Hemoglobin A1C No results found for this basename: HGBA1C,  in the last 72 hours Fasting Lipid Panel No results found for this basename: CHOL, HDL, LDLCALC, TRIG, CHOLHDL, LDLDIRECT,  in the last 72 hours Thyroid Function Tests No results found for this basename: TSH, T4TOTAL, FREET3, T3FREE, THYROIDAB,  in the last 72 hours  TELE  NSR  ECG on 10/07/14:  Normal sinus rhythm Anterolateral infarct ,  Inf. MI No changes from previous tracing  ASSESSMENT AND PLAN  1. Cardiac arrest  - likely primary event in the setting of nonischemic cardiomyopathy  S/p Artic Sun protocol.   Is awake and alert - ROSC after shocked x 2 by AED  - normally need cath, however ?cath candidate with severe thrombocytopenia.. Once patient stable, can consider coronary CT and EP consult for ICD for secondary prevention   2. H/o severe ischemic cardiomyopathy  - EF 35% by echo, 19% by stress test.  Consider repeat echo after extubation.  - Stress test noted inferior and apex scar  3. Severe thrombocytopenia, 4. bicuspid aortic valve  5. H/o mild pericardial  effusion on recent echo  6. h/o pancreatitis  7. H/o recent sepsis with biliary origin: refused by surgery to cholecystectomy due to perioperative risk with h/o cirrhosis  8. Cirrhosis  9. Obesity  10. DM  11.Junctional rhythm resolved with rewarming.  12. Anemia.  Plan: Continue supportive therapy. Plan for eventual ischemic workup and EP consult after respiratory and neurologic recovery.   Vesta MixerPhilip J. Ahmari Duerson, Montez HagemanJr., MD, [facility] 10/07/2014, 8:02 AM [address],  [address] Office [phone] Pager [phone]

## 2014-10-07 NOTE — Evaluation (Signed)
Clinical/Bedside Swallow Evaluation Patient Details  Name: [name] MRN: [id number] Date of Birth: [birth year]  Today's Date: 10/07/2014 Time: 0912-0926 SLP Time Calculation (min): 14 min  Past Medical History:  Past Medical History  Diagnosis Date  . Heart murmur     ECHO 07/2011 showed mild AS and LVH.  TEE 09/2011 showed small PFO, bicuspid aortic valve but no stenosis or regurg.  [name]. 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  Gen surg and GI recommended flagyl and cipro x 2 wks..  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  . Cholelithiasis     u/s--no cholecystitis.  06/2014 u/s showed no stones, only GB sludge  . Peripheral neuropathy     Diabetic:  Autonomic (pelvic) and LE polyneuropathy--WFUB testing showed that it is likely from DM  . Diabetic foot ulcers     Poor healing; normal ABIs and TBIs  . Arthritis     back L3 - L5  . Hyperactive gag reflex   . History of pyelonephritis   . Type II or unspecified type diabetes mellitus with neurological manifestations, not stated as uncontrolled     IDDM: neuro, renal, and ophth complications  . Wears dentures     upper  . Osteomyelitis of toe of left foot     left 4th and 5th toes--now s/p amputation of these areas  . Diabetic retinopathy associated with type 2 diabetes mellitus     Laser tx in both eyes (Dr. [name])  . Diabetic nephropathy     Proteinuria and CrCl 55-65  . Chronic renal insufficiency, stage III (moderate)   . Lumbar spondylosis     MRI 08/25/11 with multilevel facet dz, small disc protrusion at L5-S1 to the right without impingement  . Ischemic cardiomyopathy 07/22/14    Myoview w/out ischemia but large scar (inferior/apex) w/ global hypokinesis--medical mgmt  . History of pancreatitis 2014 and 2015    Suspected biliary: passing small gallstones:  surgery declined to remove GB 07/2014.  . Aspiration pneumonia   . Thrombocytopenia   . Portal hypertension   . Liver cirrhosis secondary to NASH     ?+ alcohol?.  With hx of splenomegaly and ascities (dx approx 2012)  . History of gram negative sepsis 2015    e coli   Past Surgical History:  Past Surgical History  Procedure Laterality Date  . Knee arthroscopy  2001    Bilateral  . Amputation  10/24/2012    Procedure: AMPUTATION RAY;  Surgeon: [name] A Bednarz, MD;  Location: [facility];  Service: Orthopedics;  Laterality: Left;  left 4th toe amputation through MTP joint, 5th Ray amputation   . Knee surgery  2001  . Hida scan  06/2014    Normal  . Transthoracic echocardiogram  07/19/14    Mild LV dilation and hypertrophy, EF 35-40%, no wall motion abnormalities, grade II diast dysfxn, bicuspid aortic valve with mild AS and mild AI.  [name]. Cardiovascular stress test  07/2014    Myoview w/out ischemia but large scar (inferior/apex) w/ global hypokinesis--medical mgmt  . Eye surgery  08/2014    Retinal hemorrhage evacuation   HPI:  53 yo male with history ischemic cardiomyopathy, cirrhosis, DM, aspiration pna, perforation large intestine 2012, cholelithiasis admitted after cardiac arrest at work with EMS trained bystander CPR/defib.  Approx 10 minutes CPR.  Intubated  10/15-10/19.  CXR Stable support apparatus, No change in aeration to the left base.  RN repeorts significant nausea since extubation.   Assessment / Plan / Recommendation Clinical Impression  Pt. exhibited mildly increased work of breathing at rest and during assessment increasing aspiration risk.  Vocal intensity lower but clear with denial of odnophagia  s/p extubation.  Straw sip water led to immediate cough likely caused by increased volume and velocity.  Small cup sips, self administered appeared safe.  Mild delays in mastication with solids possibly exacerbated by decreased saliva. Delayed cough x 1 at end of study  (residue ?).  Recommend Dys 3 diet texture and thin liquids, no straws, single small sips, sit upright and pills whole in puree.  Full supervision as he is at moderately high risk; allow pt. to self feed.  ST will follow.       Aspiration Risk  Moderate    Diet Recommendation Dysphagia 3 (Mechanical Soft);Thin liquid   Liquid Administration via: No straw;Cup Medication Administration: Whole meds with puree Supervision: Patient able to self feed;Full supervision/cueing for compensatory strategies Compensations: Slow rate;Small sips/bites Postural Changes and/or Swallow Maneuvers: Seated upright 90 degrees    Other  Recommendations Oral Care Recommendations: Oral care BID   Follow Up Recommendations  None    Frequency and Duration min 2x/week  2 weeks   Pertinent Vitals/Pain No pain         Swallow Study         Oral/Motor/Sensory Function Overall Oral Motor/Sensory Function: Appears within functional limits for tasks assessed   Ice Chips Ice chips: Within functional limits Presentation: Spoon   Thin Liquid Thin Liquid: Impaired Presentation: Straw Pharyngeal  Phase Impairments: Cough - Immediate    Nectar Thick Nectar Thick Liquid: Not tested   Honey Thick Honey Thick Liquid: Not tested   Puree Puree: Within functional limits   Solid   GO    Solid: Impaired Oral Phase Impairments:  (mild delay, decreased saliva?) Oral Phase Functional Implications:  (mild delay) Pharyngeal Phase Impairments:  (none)       Elaria Osias, Breck Coons 10/07/2014,9:41 AM   Breck Coons Lonell Face.Ed [employer] [phone]

## 2014-10-07 NOTE — Progress Notes (Signed)
PULMONARY / CRITICAL CARE MEDICINE   Name: [name] MRN: [id number] DOB: [birth year]    ADMISSION DATE:  10/02/2014  REFERRING MD :  EDP  CHIEF COMPLAINT:  Post arrest   INITIAL PRESENTATION: 53yo male with hx non - ischemic cardiomyopathy EF 35-40%, cirrhosis, DM, cholelithiasis presented 10/15 after arrest at work with EMS trained bystander CPR/defib. Pt suddenly collapsed, fell backward and hit his head.  AED advised shockable rhythm. Approx 10 minutes CPR with ROSC before EMS arrival.  PCCM called to admit.    STUDIES:  CT head 10/15>>>neg, Small air-fluid level in the left maxillary sinus and right sphenoid sinus    SIGNIFICANT EVENTS: 10/15 hyperkalemia (on aldactone & lisinopril) requiring CRRT 10/03/14 : : Cooled to 33  Bradycardic , on max pressors Anuric Sedated , paralysed On CRRT 10/04/14 On CRRT for high K., on pressors., Just rewarmed: following all commands, received 1 U PRBC 10/18 still on pressors, received 2 U PRBC 10/19 > extubated, off all pressors, following commands    SUBJECTIVE/OVERNIGHT/INTERVAL HX Extubated, nausea    VITAL SIGNS: Temp:  [97.2 F (36.2 C)-98.4 F (36.9 C)] 97.5 F (36.4 C) (10/20 0800) Pulse Rate:  [86-93] 91 (10/20 0800) Resp:  [0-28] 24 (10/20 0800) BP: (92-103)/(56-67) 92/58 mmHg (10/20 0000) SpO2:  [97 %-100 %] 99 % (10/20 0800) Arterial Line BP: (95-171)/(45-66) 171/62 mmHg (10/20 0800) HEMODYNAMICS:   VENTILATOR SETTINGS:   INTAKE / OUTPUT:  Intake/Output Summary (Last 24 hours) at 10/07/14 0901 Last data filed at 10/07/14 0900  Gross per 24 hour  Intake    870 ml  Output    288 ml  Net    582 ml    PHYSICAL EXAMINATION: General:  Awake  HEENT: NCAT, EOMi PULM: CTA B CV: RRR, no mgr Ab: BS+, soft Ext: anasarca/edema throughout Derm: bruising L hand improved Neuro: Awake and alert, oriented to place, year, month, maew  LABS:  PULMONARY  Recent Labs Lab 10/02/14 1717  10/02/14 2156  10/02/14 2258 10/03/14 0034 10/03/14 0836 10/03/14 1132 10/05/14 0357  PHART 7.238*  --  7.259*  --   --   --   --  7.334*  PCO2ART 48.3*  --  40.0  --   --   --   --  40.6  PO2ART 207.0*  --  282.0*  --   --   --   --  145.0*  HCO3 21.4  --  17.3*  --   --   --   --  21.1  TCO2 23  < > 18.5 19 18 20 21  22.4  O2SAT 100.0  --  100.0  --   --   --   --  99.8  < > = values in this interval not displayed.  CBC  Recent Labs Lab 10/06/14 0415 10/06/14 1500 10/07/14 0442  HGB 7.0* 8.2* 8.1*  HCT 20.6* 24.2* 23.8*  WBC 3.1* 3.4* 3.4*  PLT 18* 17*  18* 19*    COAGULATION  Recent Labs Lab 10/02/14 1700 10/02/14 2344 10/06/14 1500 10/07/14 0442  INR 1.30 1.33 1.18 1.16    CARDIAC    Recent Labs Lab 10/02/14 2200 10/03/14 0400  TROPONINI 1.86* 1.72*   No results found for this basename: PROBNP,  in the last 168 hours   CHEMISTRY  Recent Labs Lab 10/03/14 0500  10/04/14 0345  10/05/14 0223 10/05/14 1550 10/06/14 0415 10/06/14 1500 10/07/14 0442 10/07/14 0450  NA  --   < > 135*  < >  136* 133* 139 138  --  137  K  --   < > 5.3  < > 4.9 5.0 5.0 5.1  --  4.7  CL  --   < > 104  < > 103 102 105 104  --  101  CO2  --   < > 20  < > 23 25 27 26   --  25  GLUCOSE  --   < > 168*  < > 162* 262* 235* 168*  --  147*  BUN  --   < > 24*  < > 25* 30* 35* 36*  --  37*  CREATININE  --   < > 1.11  < > 1.64* 1.57* 1.48* 1.25  --  1.31  CALCIUM  --   < > 6.8*  < > 7.2* 7.0* 7.2* 7.5*  --  7.7*  MG 2.2  --  2.0  --  2.1  --  2.3  --  2.5  --   PHOS  --   < > 3.1  < > 3.5 2.8 2.2* 2.5  --  2.8  < > = values in this interval not displayed. Estimated Creatinine Clearance: 85.3 ml/min (by C-G formula based on Cr of 1.31).   LIVER  Recent Labs Lab 10/02/14 1700 10/02/14 1734 10/02/14 2344  10/05/14 1550 10/06/14 0415 10/06/14 1500 10/07/14 0442 10/07/14 0450  AST  --  44*  --   --   --   --   --  67*  --   ALT  --  35  --   --   --   --   --  44  --   ALKPHOS  --  117   --   --   --   --   --  112  --   BILITOT  --  0.8  --   --   --   --   --  1.4*  --   PROT  --  5.8*  --   --   --   --   --  4.8*  --   ALBUMIN  --  3.1*  --   < > 2.1* 2.1* 2.3* 2.2* 2.2*  INR 1.30  --  1.33  --   --   --  1.18 1.16  --   < > = values in this interval not displayed.   INFECTIOUS No results found for this basename: LATICACIDVEN, PROCALCITON,  in the last 168 hours   ENDOCRINE CBG (last 3)   Recent Labs  10/06/14 2346 10/07/14 0351 10/07/14 0752  GLUCAP 155* 141* 121*         IMAGING x48h No results found.      ASSESSMENT / PLAN:  PULMONARY OETT 10/15>>>10/19 Acute respiratory failure, post arrest > resolved Apical left pneumothx noted on CXR 10/15, resolved on FU CXR 10/16 and 10/04/14   P:   Aspiration precautions IS/Flutter Oral care SLP eval prior to eating   CARDIOVASCULAR CVL R IJ CVL 10/15>>> Cardiac arrest presumably from cardiomyopathy- shockable rhythm per AED, ~10 mins CPR Hx ischemic cardiomyopathy - EF 35-40% per echo 07/2014, but 19% per nuclear study Hx HTN Cardiogenic shock > resolved Bradycardia, junctional   - resolved   P:  Anticoagulation per cardiology Further EP eval per cardiology Will need AICD if survives this   RENAL Hyperkalemia on admit -related to lisinopril/aldactone -resolved AKI - oliguric, post arrest P:   Continue CVVHD per renal > would like  to remove volume as tolerated  GASTROINTESTINAL Hx cholelithiasis -deemed not to be a surgical candidate for cholecystectomy recently.  Hx cirrhosis - ?NASH v ETOH -not felt to be a surgical liver transplant candidate both due to multiple comorbidites Ileus ? P:   KUB PPI to BID until occult blood back NPO until SLP eval   HEMATOLOGIC Severe thrombocytopenia due to acute illness and cirrhosis (splenomegally?, clumping) Anemia> no clear source of bleeding, labs somewhat worrisome for intravascular hemolysis likely due to cirrhosis  P:  PRBC for  goal hgb > 8gm% ; 1 unit again 10/19 F/U Occult blood Consider CT abdomen if occult blood negative If clear evidence of bleeding then give PLT Repeat PLT in citrated tube  INFECTIOUS No clear infection 10/16 blood > neg 10/16 unasyn > 10/19 P:   Monitor wbc, fever curve   ENDOCRINE Hx DM    P:   Continue SSI q4, then change to AC/HS if eating  NEUROLOGIC Acute encephalopathy- post cardiac arrest  > resolved Fall    - following commands on sedation gtt on fentanyl and versed  P:   Prn fentanyl for pain after extbubation   FAMILY Family updates: will update sister if she comes today   SUMMARY  - SLP exam today, remove volume as tolerated, PT once CVVHD off  The patient is critically ill with multiple organ systems failure and requires high complexity decision making for assessment and support, frequent evaluation and titration of therapies, application of advanced monitoring technologies and extensive interpretation of multiple databases.   Critical Care Time devoted to patient care services described in this note is  35  Minutes.   Heber CarolinaBrent McQuaid, MD [facility] Pager: [phone] Cell: [phone] If no response, call [phone]  10/07/2014 9:01 AM

## 2014-10-07 NOTE — Progress Notes (Signed)
D-dimer result called to eLink.

## 2014-10-07 NOTE — Progress Notes (Signed)
Patient ID: [name], male   DOB: 27-Jun-1961, 53 y.o.   MRN: [id number] S: Awake, no complaints.   O:BP 92/58  Pulse 86  Temp(Src) 97.9 F (36.6 C) (Core (Comment))  Resp 28  Ht 5\' 10"  (1.778 m)  Wt 262 lb 5.6 oz (119 kg)  BMI 37.64 kg/m2  SpO2 100%  Intake/Output Summary (Last 24 hours) at 10/07/14 0751 Last data filed at 10/07/14 0700  Gross per 24 hour  Intake    915 ml  Output    346 ml  Net    569 ml   Intake/Output: I/O last 3 completed shifts: In: 1799.7 [I.V.:679.7; Blood:330; Other:20; NG/GT:620; IV Piggyback:150] Out: 318 [Urine:483]  Intake/Output this shift:    Weight change:   Gen: NAD, appears comfortable, verbalizes some Cardio: RRR, no m/r/g Resp: CTAB Abd: soft, no bowel sounds Ext: 1+ edema bilat LE, UE. cool feet. 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   Recent Labs Lab 10/02/14 1734  10/04/14 0345  10/04/14 1152 10/04/14 1600 10/05/14 0223 10/05/14 1550 10/06/14 0415 10/06/14 1500 10/07/14 0442 10/07/14 0450  NA 135*  < > 135*  < > 133* 138 136* 133* 139 138  --  137  K 6.9*  < > 5.3  < > 5.6* 5.5* 4.9 5.0 5.0 5.1  --  4.7  CL 106  < > 104  < > 102 106 103 102 105 104  --  101  CO2 19  < > 20  < > 20 19 23 25 27 26   --  25  GLUCOSE 183*  < > 168*  < > 201* 202* 162* 262* 235* 168*  --  147*  BUN 41*  < > 24*  < > 24* 26* 25* 30* 35* 36*  --  37*  CREATININE 1.38*  < > 1.11  < > 1.48* 1.68* 1.64* 1.57* 1.48* 1.25  --  1.31  ALBUMIN 3.1*  < > 2.4*  --   --  2.4* 2.3* 2.1* 2.1* 2.3* 2.2* 2.2*  CALCIUM 7.5*  < > 6.8*  < > 7.0* 7.0* 7.2* 7.0* 7.2* 7.5*  --  7.7*  PHOS  --   < > 3.1  --   --  4.1 3.5 2.8 2.2* 2.5  --  2.8  AST 44*  --   --   --   --   --   --   --   --   --  67*  --   ALT 35  --   --   --   --   --   --   --   --   --  44  --   < > = values in this interval not displayed. Liver Function Tests:  Recent Labs Lab 10/02/14 1734  10/06/14 1500 10/07/14 0442 10/07/14 0450  AST 44*  --   --  67*  --   ALT 35  --    --  44  --   ALKPHOS 117  --   --  112  --   BILITOT 0.8  --   --  1.4*  --   PROT 5.8*  --   --  4.8*  --   ALBUMIN 3.1*  < > 2.3* 2.2* 2.2*  < > = values in this interval not displayed. No results found for this basename: LIPASE, AMYLASE,  in the last 168 hours No results found for this basename: AMMONIA,  in the last 168 hours CBC:  Recent Labs Lab 10/02/14 1734  10/04/14 1700  10/05/14 1430 10/05/14 2210 10/06/14 0415 10/06/14 1500 10/07/14 0442  WBC 12.2*  < > 5.5  < > 3.2* 3.3* 3.1* 3.4* 3.4*  NEUTROABS 10.7*  --  4.5  --   --   --   --   --  2.5  HGB 10.3*  < > 7.7*  < > 7.0* 7.1* 7.0* 8.2* 8.1*  HCT 30.0*  < > 22.6*  < > 20.6* 21.4* 20.6* 24.2* 23.8*  MCV 89.0  < > 88.3  < > 87.7 89.5 87.3 88.6 89.1  PLT 36*  < > 26*  < > 19* 16* 18* 17*  18* 19*  < > = values in this interval not displayed. Cardiac Enzymes:  Recent Labs Lab 10/02/14 2200 10/03/14 0400  TROPONINI 1.86* 1.72*   CBG:  Recent Labs Lab 10/06/14 1139 10/06/14 1624 10/06/14 1930 10/06/14 2346 10/07/14 0351  GLUCAP 173* 156* 136* 155* 141*    Iron Studies: No results found for this basename: IRON, TIBC, TRANSFERRIN, FERRITIN,  in the last 72 hours  Assessment/Plan:  1. ARF- in setting of cardiac arrest while on lisinopril. CVP 12. Will stop CVVHD now as filter clotting, pressure tolerating and no dyspnea/increased O2 requirement. If needed we can initiate HD. Ok to use heparin in dialysis cath.  2. Hyperkalemia- Resolved. 3. Anemia- normocytic.  S/p 1u 10/17 and 2u 10/18. No response/still downtrending with hgb 7.0 this AM... ?GI bleed. DIC panel positive. 4. Hypocalcemia - repleted 10/17 with 1g calcium gluconate IV, now 7.2 (corrects to 8.6) 5. Cardiac arrest- on amio and cardiology following 6. VDRF- per PCCM 7. SIRS- hypovolemic- will keep + with IVF's, off pressors 10/19 8. DM- per PCCM 9. Cirrhosis 10. Thrombocytopenia - stable at [address]18  Tawni CarnesWight, Andrew 10/07/2014, 7:51 AM  Renal  Attending: Patient has tolerated CVVHD and we have not ben removing fluid.  He is extubated. He appears comfortable with nasal oxygen. CVP now 12.  Metabolic parameters are OK. CVVHD filter is clotting so we will discontinue this form of dialysis.  We will give a challenge with diuretics.  If needed we will resume dialysis with intermittent hemodialysis.   Apollonia Amini C

## 2014-10-07 NOTE — Progress Notes (Signed)
NUTRITION FOLLOW UP  DOCUMENTATION CODES Per approved criteria  -Obesity Unspecified   INTERVENTION: Ensure Complete po BID, each supplement provides 350 kcal and 13 grams of protein  NUTRITION DIAGNOSIS: Inadequate oral intake related to inability to eat as evidenced by NPO status; progressing.   Goal: Enteral nutrition to provide 60-70% of estimated calorie needs (22-25 kcals/kg ideal body weight) and 100% of estimated protein needs, based on ASPEN guidelines for permissive underfeeding in critically ill obese individuals; NA  New Goal: Pt to meet >/= 90% of their estimated nutrition needs   Monitor:  PO intake, supplement acceptance, weight trends, labs  ASSESSMENT: Pt with hx non - ischemic cardiomyopathy EF 35-40%, cirrhosis, DM, cholelithiasis presented 10/15 after arrest at work with EMS trained bystander CPR/defib.  Pt extubated 10/19 and passed swallow evaluation 10/20. Per Nephrology ok to stop CVVHD, can transition to IHD if needed. Per RN doing well.  Electrolytes WNL.  Height: Ht Readings from Last 1 Encounters:  10/02/14 5\' 10"  (1.778 m)    Weight: Wt Readings from Last 1 Encounters:  10/06/14 262 lb 5.6 oz (119 kg)  2+ edema  BMI:  Body mass index is 37.64 kg/(m^2).  Estimated Nutritional Needs: Kcal: 2141 Protein: >/= 150 grams Fluid: >1.5 L/day  Skin: laceration on head  Diet Order: Dysphagia 3 with Thin Liquids   Intake/Output Summary (Last 24 hours) at 10/07/14 1658 Last data filed at 10/07/14 1500  Gross per 24 hour  Intake    470 ml  Output    821 ml  Net   -351 ml    Last BM: 10/15  Labs:   Recent Labs Lab 10/05/14 0223  10/06/14 0415 10/06/14 1500 10/07/14 0442 10/07/14 0450  NA 136*  < > 139 138  --  137  K 4.9  < > 5.0 5.1  --  4.7  CL 103  < > 105 104  --  101  CO2 23  < > 27 26  --  25  BUN 25*  < > 35* 36*  --  37*  CREATININE 1.64*  < > 1.48* 1.25  --  1.31  CALCIUM 7.2*  < > 7.2* 7.5*  --  7.7*  MG 2.1  --  2.3   --  2.5  --   PHOS 3.5  < > 2.2* 2.5  --  2.8  GLUCOSE 162*  < > 235* 168*  --  147*  < > = values in this interval not displayed.  CBG (last 3)   Recent Labs  10/07/14 0752 10/07/14 1202 10/07/14 1626  GLUCAP 121* 143* 148*    Scheduled Meds: . sodium chloride   Intravenous Once  . antiseptic oral rinse  7 mL Mouth Rinse QID  . chlorhexidine  15 mL Mouth Rinse BID  . furosemide  100 mg Intravenous Q12H  . insulin aspart  0-15 Units Subcutaneous 6 times per day  . insulin glargine  10 Units Subcutaneous Daily  . pantoprazole (PROTONIX) IV  40 mg Intravenous Q12H  . sodium chloride  10-40 mL Intracatheter Q12H    Continuous Infusions: . sodium chloride    . dialysis replacement fluid (prismasate) 200 mL/hr at 10/06/14 1400  . dialysis replacement fluid (prismasate) 300 mL/hr at 10/06/14 2258  . dialysate (PRISMASATE) 2,000 mL/hr at 10/07/14 3437    [facility] RD, LDN, CNSC [phone] Pager [phone] After Hours Pager

## 2014-10-07 NOTE — Progress Notes (Signed)
PT Cancellation Note  Patient Details Name: CONELL NARDOLILLO MRN: [id number] DOB: [birth year]   Cancelled Treatment:    Reason Eval/Treat Not Completed: Medical issues which prohibited therapy (Pt with fem cath in.) Will eval after fem cath out.   Alyiah Ulloa 10/07/2014, 2:45 PM

## 2014-10-08 LAB — RENAL FUNCTION PANEL
Albumin: 2.4 g/dL — ABNORMAL LOW (ref 3.5–5.2)
Anion gap: 10 (ref 5–15)
BUN: 52 mg/dL — ABNORMAL HIGH (ref 6–23)
CALCIUM: 8.1 mg/dL — AB (ref 8.4–10.5)
CO2: 26 mEq/L (ref 19–32)
CREATININE: 1.62 mg/dL — AB (ref 0.50–1.35)
Chloride: 103 mEq/L (ref 96–112)
GFR calc Af Amer: 55 mL/min — ABNORMAL LOW (ref >90)
GFR calc non Af Amer: 47 mL/min — ABNORMAL LOW (ref >90)
Glucose, Bld: 159 mg/dL — ABNORMAL HIGH (ref 70–99)
Phosphorus: 3.4 mg/dL (ref 2.3–4.6)
Potassium: 4.5 mEq/L (ref 3.7–5.3)
SODIUM: 139 meq/L (ref 137–147)

## 2014-10-08 LAB — GLUCOSE, CAPILLARY
GLUCOSE-CAPILLARY: 234 mg/dL — AB (ref 70–99)
Glucose-Capillary: 145 mg/dL — ABNORMAL HIGH (ref 70–99)
Glucose-Capillary: 275 mg/dL — ABNORMAL HIGH (ref 70–99)
Glucose-Capillary: 269 mg/dL — ABNORMAL HIGH (ref 70–99)

## 2014-10-08 LAB — CBC
HEMATOCRIT: 24.7 % — AB (ref 39.0–52.0)
HEMOGLOBIN: 8.3 g/dL — AB (ref 13.0–17.0)
MCH: 30.3 pg (ref 26.0–34.0)
MCHC: 33.6 g/dL (ref 30.0–36.0)
MCV: 90.1 fL (ref 78.0–100.0)
Platelets: 34 10*3/uL — ABNORMAL LOW (ref 150–400)
RBC: 2.74 MIL/uL — AB (ref 4.22–5.81)
RDW: 17.4 % — AB (ref 11.5–15.5)
WBC: 4.6 10*3/uL (ref 4.0–10.5)

## 2014-10-08 LAB — APTT: APTT: 25 s (ref 24–37)

## 2014-10-08 LAB — MAGNESIUM: Magnesium: 2.5 mg/dL (ref 1.5–2.5)

## 2014-10-08 MED ORDER — SODIUM CHLORIDE 0.9 % IV SOLN
Freq: Once | INTRAVENOUS | Status: AC
Start: 2014-10-08 — End: 2014-10-08
  Administered 2014-10-08: 12:00:00 via INTRAVENOUS

## 2014-10-08 MED ORDER — LABETALOL HCL 5 MG/ML IV SOLN
20.0000 mg | INTRAVENOUS | Status: DC | PRN
  Filled 2014-10-08: qty 4

## 2014-10-08 MED ORDER — INSULIN ASPART 100 UNIT/ML ~~LOC~~ SOLN
0.0000 [IU] | Freq: Three times a day (TID) | SUBCUTANEOUS | Status: DC
  Administered 2014-10-08: 11 [IU] via SUBCUTANEOUS
  Administered 2014-10-08: 2 [IU] via SUBCUTANEOUS
  Administered 2014-10-08: 8 [IU] via SUBCUTANEOUS
  Administered 2014-10-09: 3 [IU] via SUBCUTANEOUS
  Administered 2014-10-09: 11 [IU] via SUBCUTANEOUS

## 2014-10-08 NOTE — Progress Notes (Signed)
Per MD order, HD cath removed and the cathter is intact. Vaseline pressure gauze to site, pressure held a total of 30 minutes (15 min by IV team and 15 min by Sharia Reeve RN). Minimal bleeding to site. Pt instructed not to get out of bed for 60 min after the removal of the central line. Instucted to keep dressing CDI x 24hours, if bleeding occurs hold pressure, and contact RN. Consuello Masse

## 2014-10-08 NOTE — Progress Notes (Signed)
PT Cancellation Note  Patient Details Name: [name] MRN: [id number] DOB: 07/28/1961   Cancelled Treatment:    Reason Eval/Treat Not Completed: Medical issues which prohibited therapy (Pt with femoral cath still in)   [facility] 10/08/2014, 10:33 AM

## 2014-10-08 NOTE — Progress Notes (Signed)
Patient ID: [name], male   DOB: 07-Aug-1961, 53 y.o.   MRN: [id number] S: Feeling better, arms are "not as tight".  O:BP 171/73  Pulse 88  Temp(Src) 98.4 F (36.9 C) (Core (Comment))  Resp 24  Ht 5\' 10"  (1.778 m)  Wt 257 lb 11.5 oz (116.9 kg)  BMI 36.98 kg/m2  SpO2 100%  Intake/Output Summary (Last 24 hours) at 10/08/14 0750 Last data filed at 10/08/14 0600  Gross per 24 hour  Intake    720 ml  Output   2770 ml  Net  -2050 ml   Intake/Output: I/O last 3 completed shifts: In: 980 [P.O.:270; I.V.:690; Other:20] Out: 2869 [Urine:2948]  Intake/Output this shift:    Weight change: -14.1 oz (-0.4 kg)  Gen: NAD Cardio: RRR, no m/r/g Resp: CTAB Abd: soft, no bowel sounds Ext: 1+ edema bilat LE, UE. cool feet. 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   Recent Labs Lab 10/02/14 1734  10/05/14 0223 10/05/14 1550 10/06/14 0415 10/06/14 1500 10/07/14 0442 10/07/14 0450 10/07/14 1600 10/08/14 0445  NA 135*  < > 136* 133* 139 138  --  137 137 139  K 6.9*  < > 4.9 5.0 5.0 5.1  --  4.7 4.6 4.5  CL 106  < > 103 102 105 104  --  101 101 103  CO2 19  < > 23 25 27 26   --  25 25 26   GLUCOSE 183*  < > 162* 262* 235* 168*  --  147* 160* 159*  BUN 41*  < > 25* 30* 35* 36*  --  37* 45* 52*  CREATININE 1.38*  < > 1.64* 1.57* 1.48* 1.25  --  1.31 1.53* 1.62*  ALBUMIN 3.1*  < > 2.3* 2.1* 2.1* 2.3* 2.2* 2.2* 2.3* 2.4*  CALCIUM 7.5*  < > 7.2* 7.0* 7.2* 7.5*  --  7.7* 7.8* 8.1*  PHOS  --   < > 3.5 2.8 2.2* 2.5  --  2.8 3.0 3.4  AST 44*  --   --   --   --   --  67*  --   --   --   ALT 35  --   --   --   --   --  44  --   --   --   < > = values in this interval not displayed. Liver Function Tests:  Recent Labs Lab 10/02/14 1734  10/07/14 0442 10/07/14 0450 10/07/14 1600 10/08/14 0445  AST 44*  --  67*  --   --   --   ALT 35  --  44  --   --   --   ALKPHOS 117  --  112  --   --   --   BILITOT 0.8  --  1.4*  --   --   --   PROT 5.8*  --  4.8*  --   --   --   ALBUMIN  3.1*  < > 2.2* 2.2* 2.3* 2.4*  < > = values in this interval not displayed. No results found for this basename: LIPASE, AMYLASE,  in the last 168 hours No results found for this basename: AMMONIA,  in the last 168 hours CBC:  Recent Labs Lab 10/02/14 1734  10/04/14 1700  10/05/14 2210 10/06/14 0415 10/06/14 1500 10/07/14 0442 10/07/14 0930 10/08/14 0445  WBC 12.2*  < > 5.5  < > 3.3* 3.1* 3.4* 3.4*  --  4.6  NEUTROABS 10.7*  --  4.5  --   --   --   --  2.5  --   --   HGB 10.3*  < > 7.7*  < > 7.1* 7.0* 8.2* 8.1*  --  8.3*  HCT 30.0*  < > 22.6*  < > 21.4* 20.6* 24.2* 23.8*  --  24.7*  MCV 89.0  < > 88.3  < > 89.5 87.3 88.6 89.1  --  90.1  PLT 36*  < > 26*  < > 16* 18* 17*  18* 19* 20* 34*  < > = values in this interval not displayed. Cardiac Enzymes:  Recent Labs Lab 10/02/14 2200 10/03/14 0400  TROPONINI 1.86* 1.72*   CBG:  Recent Labs Lab 10/07/14 0351 10/07/14 0752 10/07/14 1202 10/07/14 1626 10/07/14 2149  GLUCAP 141* 121* 143* 148* 151*    Iron Studies: No results found for this basename: IRON, TIBC, TRANSFERRIN, FERRITIN,  in the last 72 hours  Assessment/Plan: 53 y.o. male with PMH CHF, cirrhosis, DM, admitted after cardiac arrest on 10/15, CPR x 10 min with ROSC, underwent cooling protocol. Developed oliguria and had CVVHD x 5 days.  1. ARF- in setting of cardiac arrest while on lisinopril. Scr stable and now producing urine. Can remove R fem HD cath after receiving FFP. 2. Hyperkalemia- Resolved. 3. Anemia- normocytic.  S/p 1u 10/17 and 2u 10/18. DIC panel positive. Hgb now appears to be stable. 4. Thrombocytopenia: improving, FFP today before HD cath removal 5. Hypocalcemia - improved. 6. DM- per PCCM 7. Cirrhosis 8. Thrombocytopenia - stable at [address] 10/08/2014, 7:50 AM  Renal Attending: He is recovering from AKI and nonoliguric.  Plt ct 34K today. We plan to remove femoral catheter so he can be mobilized and CCM plans platelet  transfusion prior to cath removal.  We are hopeful for no further dialysis.  He is alert and appears appropriate. [name] C

## 2014-10-08 NOTE — Progress Notes (Signed)
PULMONARY / CRITICAL CARE MEDICINE   Name: [name] MRN: [id number] DOB: [birth year]    ADMISSION DATE:  10/02/2014  REFERRING MD :  EDP  CHIEF COMPLAINT:  Post arrest   INITIAL PRESENTATION: 53yo male with hx non - ischemic cardiomyopathy EF 35-40%, cirrhosis, DM, cholelithiasis presented 10/15 after arrest at work with EMS trained bystander CPR/defib. Pt suddenly collapsed, fell backward and hit his head.  AED advised shockable rhythm. Approx 10 minutes CPR with ROSC before EMS arrival.  PCCM called to admit.    STUDIES:  CT head 10/15>>>neg, Small air-fluid level in the left maxillary sinus and right sphenoid sinus    SIGNIFICANT EVENTS: 10/15 hyperkalemia (on aldactone & lisinopril) requiring CRRT 10/03/14 : : Cooled to 33  Bradycardic , on max pressors Anuric Sedated , paralysed On CRRT 10/04/14 On CRRT for high K., on pressors., Just rewarmed: following all commands, received 1 U PRBC 10/18 still on pressors, received 2 U PRBC 10/19 > extubated, off all pressors, following commands 10/20> passed swallow eval, CVVHD off, given lasix made great urine    SUBJECTIVE/OVERNIGHT/INTERVAL HX passed swallow eval, CVVHD off, given lasix made great urine    VITAL SIGNS: Temp:  [97.5 F (36.4 C)-99.1 F (37.3 C)] 98.2 F (36.8 C) (10/21 0600) Pulse Rate:  [83-95] 87 (10/21 0600) Resp:  [17-29] 18 (10/21 0600) SpO2:  [95 %-100 %] 100 % (10/21 0600) Arterial Line BP: (129-175)/(58-75) 163/70 mmHg (10/21 0600) Weight:  [116.9 kg (257 lb 11.5 oz)] 116.9 kg (257 lb 11.5 oz) (10/21 0400) HEMODYNAMICS:   VENTILATOR SETTINGS:   INTAKE / OUTPUT:  Intake/Output Summary (Last 24 hours) at 10/08/14 0707 Last data filed at 10/08/14 0600  Gross per 24 hour  Intake    720 ml  Output   2770 ml  Net  -2050 ml    PHYSICAL EXAMINATION: General:  Awake, good spirits HEENT: NCAT, EOMi PULM: CTA B CV: RRR, no mgr Ab: BS+, soft Ext: anasarca/edema improved Derm: no skin  breakdown Neuro: Awake and alert, oriented to situation, conversant  LABS:  PULMONARY  Recent Labs Lab 10/02/14 1717  10/02/14 2156 10/02/14 2258 10/03/14 0034 10/03/14 0836 10/03/14 1132 10/05/14 0357  PHART 7.238*  --  7.259*  --   --   --   --  7.334*  PCO2ART 48.3*  --  40.0  --   --   --   --  40.6  PO2ART 207.0*  --  282.0*  --   --   --   --  145.0*  HCO3 21.4  --  17.3*  --   --   --   --  21.1  TCO2 23  < > 18.5 19 18 20 21  22.4  O2SAT 100.0  --  100.0  --   --   --   --  99.8  < > = values in this interval not displayed.  CBC  Recent Labs Lab 10/06/14 1500 10/07/14 0442 10/07/14 0930 10/08/14 0445  HGB 8.2* 8.1*  --  8.3*  HCT 24.2* 23.8*  --  24.7*  WBC 3.4* 3.4*  --  4.6  PLT 17*  18* 19* 20* 34*    COAGULATION  Recent Labs Lab 10/02/14 1700 10/02/14 2344 10/06/14 1500 10/07/14 0442  INR 1.30 1.33 1.18 1.16    CARDIAC    Recent Labs Lab 10/02/14 2200 10/03/14 0400  TROPONINI 1.86* 1.72*   No results found for this basename: PROBNP,  in the last 168 hours  CHEMISTRY  Recent Labs Lab 10/04/14 0345  10/05/14 0223  10/06/14 0415 10/06/14 1500 10/07/14 0442 10/07/14 0450 10/07/14 1600 10/08/14 0445  NA 135*  < > 136*  < > 139 138  --  137 137 139  K 5.3  < > 4.9  < > 5.0 5.1  --  4.7 4.6 4.5  CL 104  < > 103  < > 105 104  --  101 101 103  CO2 20  < > 23  < > 27 26  --  25 25 26   GLUCOSE 168*  < > 162*  < > 235* 168*  --  147* 160* 159*  BUN 24*  < > 25*  < > 35* 36*  --  37* 45* 52*  CREATININE 1.11  < > 1.64*  < > 1.48* 1.25  --  1.31 1.53* 1.62*  CALCIUM 6.8*  < > 7.2*  < > 7.2* 7.5*  --  7.7* 7.8* 8.1*  MG 2.0  --  2.1  --  2.3  --  2.5  --   --  2.5  PHOS 3.1  < > 3.5  < > 2.2* 2.5  --  2.8 3.0 3.4  < > = values in this interval not displayed. Estimated Creatinine Clearance: 68.4 ml/min (by C-G formula based on Cr of 1.62).   LIVER  Recent Labs Lab 10/02/14 1700 10/02/14 1734 10/02/14 2344  10/06/14 1500  10/07/14 0442 10/07/14 0450 10/07/14 1600 10/08/14 0445  AST  --  44*  --   --   --  67*  --   --   --   ALT  --  35  --   --   --  44  --   --   --   ALKPHOS  --  117  --   --   --  112  --   --   --   BILITOT  --  0.8  --   --   --  1.4*  --   --   --   PROT  --  5.8*  --   --   --  4.8*  --   --   --   ALBUMIN  --  3.1*  --   < > 2.3* 2.2* 2.2* 2.3* 2.4*  INR 1.30  --  1.33  --  1.18 1.16  --   --   --   < > = values in this interval not displayed.   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  Normal bowel gas pattern.  No bowel dilatation or bowel wall thickening.  Osseous structures unremarkable.  No urinary tract calcification.  IMPRESSION: Normal bowel gas pattern.   Electronically Signed   By: Ulyses Southward M.D.   On: 10/07/2014 09:40        ASSESSMENT / PLAN:  PULMONARY OETT 10/15>>>10/19 Acute respiratory failure, post arrest > resolved  Apical left pneumothax noted on CXR 10/15, resolved  P:   IS/Flutter O2 as needed for O2 saturation > 92%  CARDIOVASCULAR CVL R IJ CVL 10/15>>> Cardiac arrest presumably from cardiomyopathy- shockable rhythm per AED, ~10 mins CPR Hx ischemic cardiomyopathy - EF 35-40% per echo 07/2014, but 19% per nuclear study Hx HTN Cardiogenic shock > resolved Bradycardia, junctional   - resolved P:  Anticoagulation per cardiology Further EP eval per cardiology Will  need AICD if survives this  RENAL Hyperkalemia -resolved AKI - dramatically improving P:   Lasix per renal Would like to d/c femoral HD line, will check with renal  GASTROINTESTINAL Hx cholelithiasis -deemed not to be a surgical candidate for cholecystectomy recently.  Hx cirrhosis - ?NASH v ETOH -not felt to be a surgical liver transplant candidate both due to multiple comorbidites P:   Dysphagia diet as tolerated Change PPI back to daily   HEMATOLOGIC Severe thrombocytopenia due to acute illness and cirrhosis (splenomegally?, clumping) > improving Anemia> stable no clear source of bleeding, labs somewhat worrisome for intravascular hemolysis likely due to cirrhosis  P:  PRBC for goal hgb > 8gm%  Will give platelets today if OK by renal to remove HD cath  INFECTIOUS No clear infection 10/16 blood > neg 10/16 unasyn > 10/19 P:   Monitor wbc, fever curve   ENDOCRINE Hx DM    P:   Continue SSI  AC/HS   NEUROLOGIC Acute encephalopathy- post cardiac arrest  > resolved Fall    - following commands on sedation gtt on fentanyl and versed  P:   Physical therapy eval   FAMILY Family updates: sister updated on 10/21;   SUMMARY  - hopefully remove HD cath, PT consult, move to SDU   Heber CarolinaBrent McQuaid, MD [facility] Pager: [phone] Cell: [phone] If no response, call [phone]  10/08/2014 7:07 AM

## 2014-10-08 NOTE — Progress Notes (Addendum)
Patient Name: Ralph BuckerRobert A Livermore Date of Encounter: 10/08/2014     Principal Problem:   Cardiogenic shock Active Problems:   Cardiac arrest   Anemia due to other cause   Acute respiratory failure with hypoxia   AKI (acute kidney injury)    SUBJECTIVE  Patient has been extubated. He still having lots of nausea. He has not had a bowel movement in several days.  CURRENT MEDS . sodium chloride   Intravenous Once  . feeding supplement (ENSURE COMPLETE)  237 mL Oral BID BM  . insulin aspart  0-15 Units Subcutaneous TID WC  . insulin glargine  10 Units Subcutaneous Daily  . pantoprazole (PROTONIX) IV  40 mg Intravenous Q12H  . sodium chloride  10-40 mL Intracatheter Q12H    OBJECTIVE  Filed Vitals:   10/08/14 0600 10/08/14 0700 10/08/14 0731 10/08/14 0800  BP:   171/73 144/64  Pulse: 87 89 88 88  Temp: 98.2 F (36.8 C) 98.2 F (36.8 C) 98.4 F (36.9 C) 98.4 F (36.9 C)  TempSrc:   Core (Comment)   Resp: 18 24 24 22   Height:      Weight:      SpO2: 100% 98% 100% 100%    Intake/Output Summary (Last 24 hours) at 10/08/14 0826 Last data filed at 10/08/14 0800  Gross per 24 hour  Intake    720 ml  Output   2773 ml  Net  -2053 ml   Filed Weights   10/06/14 0345 10/07/14 0600 10/08/14 0400  Weight: 262 lb 5.6 oz (119 kg) 258 lb 9.6 oz (117.3 kg) 257 lb 11.5 oz (116.9 kg)    PHYSICAL EXAM   HEENT:  Normal  Neck: Supple without bruits or JVD. Lungs:  Resp regular.    Clear anteriorly. Heart: RRR no s3, s4. Grade 2/6 systolic murmur at base. Abdomen:   Mild distention Extremities: No clubbing, cyanosis.  + anasarca .  DP/PT/Radials 2+ and equal bilaterally. Neuro:  Awake and alert    Accessory Clinical Findings  CBC  Recent Labs  10/07/14 0442 10/07/14 0930 10/08/14 0445  WBC 3.4*  --  4.6  NEUTROABS 2.5  --   --   HGB 8.1*  --  8.3*  HCT 23.8*  --  24.7*  MCV 89.1  --  90.1  PLT 19* 20* 34*   Basic Metabolic Panel  Recent Labs  10/07/14 0442   10/07/14 1600 10/08/14 0445  NA  --   < > 137 139  K  --   < > 4.6 4.5  CL  --   < > 101 103  CO2  --   < > 25 26  GLUCOSE  --   < > 160* 159*  BUN  --   < > 45* 52*  CREATININE  --   < > 1.53* 1.62*  CALCIUM  --   < > 7.8* 8.1*  MG 2.5  --   --  2.5  PHOS  --   < > 3.0 3.4  < > = values in this interval not displayed. Liver Function Tests  Recent Labs  10/07/14 0442  10/07/14 1600 10/08/14 0445  AST 67*  --   --   --   ALT 44  --   --   --   ALKPHOS 112  --   --   --   BILITOT 1.4*  --   --   --   PROT 4.8*  --   --   --  ALBUMIN 2.2*  < > 2.3* 2.4*  < > = values in this interval not displayed. No results found for this basename: LIPASE, AMYLASE,  in the last 72 hours Cardiac Enzymes No results found for this basename: CKTOTAL, CKMB, CKMBINDEX, TROPONINI,  in the last 72 hours BNP No components found with this basename: POCBNP,  D-Dimer  Recent Labs  10/06/14 1500  DDIMER 14.84*   Hemoglobin A1C No results found for this basename: HGBA1C,  in the last 72 hours Fasting Lipid Panel No results found for this basename: CHOL, HDL, LDLCALC, TRIG, CHOLHDL, LDLDIRECT,  in the last 72 hours Thyroid Function Tests No results found for this basename: TSH, T4TOTAL, FREET3, T3FREE, THYROIDAB,  in the last 72 hours  TELE  NSR  ECG on 10/07/14:  Normal sinus rhythm Anterolateral infarct ,  Inf. MI No changes from previous tracing  ASSESSMENT AND PLAN  1. Cardiac arrest  - likely primary event in the setting of nonischemic cardiomyopathy  S/p [place] protocol.   Is awake and alert - ROSC after shocked x 2 by AED   2. H/o severe ischemic cardiomyopathy  - EF 35% by echo, 19% by stress test.  Consider repeat echo after extubation.  - Stress test noted inferior and apex scar  Needs to have a cath - will schedule for Friday.  Would prefer that he have a platelet count of 50K prior to cath.  3. Severe thrombocytopenia, - improving  4. bicuspid aortic valve  5.  H/o mild pericardial effusion on recent echo  6. h/o pancreatitis  7. H/o recent sepsis with biliary origin: refused by surgery to cholecystectomy due to perioperative risk with h/o cirrhosis  8. Cirrhosis  9. Obesity  10. DM  11.Junctional rhythm resolved with rewarming.  12. Anemia. 13. Acute renal failure:  Creatinine has been slowly increasing since he stopped CVVH. Will need to hydrate prior to cath.  Minimal contrast.     Alvia Grove., MD, [facility] 10/08/2014, 8:26 AM [address]  [address] Office [phone] Pager [phone]

## 2014-10-09 ENCOUNTER — Telehealth: Payer: Self-pay | Admitting: Family Medicine

## 2014-10-09 ENCOUNTER — Inpatient Hospital Stay (HOSPITAL_COMMUNITY): Payer: 59

## 2014-10-09 DIAGNOSIS — I255 Ischemic cardiomyopathy: Secondary | ICD-10-CM

## 2014-10-09 DIAGNOSIS — I5022 Chronic systolic (congestive) heart failure: Secondary | ICD-10-CM

## 2014-10-09 DIAGNOSIS — E1165 Type 2 diabetes mellitus with hyperglycemia: Secondary | ICD-10-CM

## 2014-10-09 DIAGNOSIS — K8010 Calculus of gallbladder with chronic cholecystitis without obstruction: Secondary | ICD-10-CM

## 2014-10-09 DIAGNOSIS — J810 Acute pulmonary edema: Secondary | ICD-10-CM

## 2014-10-09 DIAGNOSIS — J9383 Other pneumothorax: Secondary | ICD-10-CM

## 2014-10-09 LAB — PREPARE PLATELET PHERESIS: UNIT DIVISION: 0

## 2014-10-09 LAB — TYPE AND SCREEN
ABO/RH(D): B POS
Antibody Screen: NEGATIVE

## 2014-10-09 LAB — RENAL FUNCTION PANEL
ANION GAP: 8 (ref 5–15)
Albumin: 2.3 g/dL — ABNORMAL LOW (ref 3.5–5.2)
BUN: 51 mg/dL — AB (ref 6–23)
CHLORIDE: 104 meq/L (ref 96–112)
CO2: 28 mEq/L (ref 19–32)
CREATININE: 1.37 mg/dL — AB (ref 0.50–1.35)
Calcium: 8.1 mg/dL — ABNORMAL LOW (ref 8.4–10.5)
GFR calc Af Amer: 67 mL/min — ABNORMAL LOW (ref >90)
GFR calc non Af Amer: 58 mL/min — ABNORMAL LOW (ref >90)
GLUCOSE: 171 mg/dL — AB (ref 70–99)
POTASSIUM: 4.2 meq/L (ref 3.7–5.3)
Phosphorus: 3.3 mg/dL (ref 2.3–4.6)
Sodium: 140 mEq/L (ref 137–147)

## 2014-10-09 LAB — GLUCOSE, CAPILLARY
GLUCOSE-CAPILLARY: 189 mg/dL — AB (ref 70–99)
Glucose-Capillary: 173 mg/dL — ABNORMAL HIGH (ref 70–99)
Glucose-Capillary: 304 mg/dL — ABNORMAL HIGH (ref 70–99)
Glucose-Capillary: 218 mg/dL — ABNORMAL HIGH (ref 70–99)
Glucose-Capillary: 158 mg/dL — ABNORMAL HIGH (ref 70–99)

## 2014-10-09 LAB — CBC
HCT: 24.4 % — ABNORMAL LOW (ref 39.0–52.0)
Hemoglobin: 8.1 g/dL — ABNORMAL LOW (ref 13.0–17.0)
MCH: 29.7 pg (ref 26.0–34.0)
MCHC: 33.2 g/dL (ref 30.0–36.0)
MCV: 89.4 fL (ref 78.0–100.0)
Platelets: 44 10*3/uL — ABNORMAL LOW (ref 150–400)
RBC: 2.73 MIL/uL — AB (ref 4.22–5.81)
RDW: 17.5 % — ABNORMAL HIGH (ref 11.5–15.5)
WBC: 4.7 10*3/uL (ref 4.0–10.5)

## 2014-10-09 LAB — CULTURE, BLOOD (ROUTINE X 2)
Culture: NO GROWTH
Culture: NO GROWTH

## 2014-10-09 LAB — MAGNESIUM: MAGNESIUM: 2.7 mg/dL — AB (ref 1.5–2.5)

## 2014-10-09 LAB — PROTIME-INR
INR: 1.06 (ref 0.00–1.49)
Prothrombin Time: 13.9 seconds (ref 11.6–15.2)

## 2014-10-09 MED ORDER — INSULIN ASPART 100 UNIT/ML ~~LOC~~ SOLN
0.0000 [IU] | SUBCUTANEOUS | Status: DC
  Administered 2014-10-09: 7 [IU] via SUBCUTANEOUS
  Administered 2014-10-09: 4 [IU] via SUBCUTANEOUS
  Administered 2014-10-10: 3 [IU] via SUBCUTANEOUS
  Administered 2014-10-10: 4 [IU] via SUBCUTANEOUS
  Administered 2014-10-10: 7 [IU] via SUBCUTANEOUS
  Administered 2014-10-10: 4 [IU] via SUBCUTANEOUS
  Administered 2014-10-11: 3 [IU] via SUBCUTANEOUS
  Administered 2014-10-11: 5 [IU] via SUBCUTANEOUS
  Administered 2014-10-11: 7 [IU] via SUBCUTANEOUS
  Administered 2014-10-12: 4 [IU] via SUBCUTANEOUS
  Administered 2014-10-12: 7 [IU] via SUBCUTANEOUS
  Administered 2014-10-12 – 2014-10-13 (×2): 3 [IU] via SUBCUTANEOUS
  Administered 2014-10-13: 7 [IU] via SUBCUTANEOUS
  Administered 2014-10-13: 5 [IU] via SUBCUTANEOUS
  Administered 2014-10-14 – 2014-10-15 (×3): 4 [IU] via SUBCUTANEOUS
  Administered 2014-10-15: 3 [IU] via SUBCUTANEOUS
  Administered 2014-10-15: 4 [IU] via SUBCUTANEOUS

## 2014-10-09 MED ORDER — FUROSEMIDE 10 MG/ML IJ SOLN
60.0000 mg | Freq: Once | INTRAMUSCULAR | Status: AC
  Administered 2014-10-09: 60 mg via INTRAVENOUS
  Filled 2014-10-09: qty 6

## 2014-10-09 MED ORDER — SODIUM CHLORIDE 0.9 % IV SOLN
Freq: Once | INTRAVENOUS | Status: AC
  Administered 2014-10-09: 18:00:00 via INTRAVENOUS

## 2014-10-09 MED ORDER — INSULIN GLARGINE 100 UNIT/ML ~~LOC~~ SOLN
16.0000 [IU] | Freq: Every day | SUBCUTANEOUS | Status: DC
  Administered 2014-10-10 – 2014-10-15 (×6): 16 [IU] via SUBCUTANEOUS
  Filled 2014-10-09 (×10): qty 0.16

## 2014-10-09 MED ORDER — INSULIN GLARGINE 100 UNIT/ML ~~LOC~~ SOLN
6.0000 [IU] | Freq: Once | SUBCUTANEOUS | Status: AC
  Administered 2014-10-09: 6 [IU] via SUBCUTANEOUS
  Filled 2014-10-09: qty 0.06

## 2014-10-09 NOTE — Progress Notes (Signed)
[place] TEAM 1 - Stepdown/ICU TEAM Progress Note  [name] ZOX:[id number] DOB: 1961-05-16 DOA: 10/02/2014 PCP: [name], MD  Admit HPI / Brief Narrative: 53yo WM PMHx non - ischemic cardiomyopathy EF 35-40% (Not on Hm O2), cirrhosis, DM type 2, cholelithiasis presented 10/15 after arrest at work with EMS trained bystander CPR/defib. Pt suddenly collapsed, fell backward and hit his head. AED advised shockable rhythm. Approx 10 minutes CPR with ROSC before EMS arrival. PCCM called to admit.    HPI/Subjective: 10/22 Recalls stating Lenor Coffin feel good, then next thing he wakes up here after (3) days. Positive SOB/DOE. States does not use O2 at home   Assessment/Plan: Acute respiratory failure, post arrest  - resolved   Apical left pneumothax noted on CXR 10/15, -resolved  -O2 as needed for O2 saturation > 92%   Pulmonary edema/pleural effusion -Strict in and out -Daily weight -Lasix 60 mg x1  Cardiac arrest presumably from ischemic cardiomyopathy - Prior to admission at work shockable rhythm per AED, ~10 mins CPR  - EF 35-40% per echo 07/2014, but 19% per nuclear study   HTN/Cardiogenic shock/Bradycardia, junctional - resolved  -Anti-coagulation on hold; patient scheduled for cardiac catheterization 10/23 :  -Further EP eval per cardiology  -Will need AICD if survives this   Hyperkalemia -resolved   AKI  - improving   Hx cholelithiasis  -deemed not to be a surgical candidate for cholecystectomy recently.  Hx cirrhosis/?NASH v ETOH  -not felt to be a surgical liver transplant candidate both due to multiple comorbidites  -Dysphagia diet/carbohydrate modified  -Continue Change PPI daily   Severe thrombocytopenia due to acute illness and cirrhosis (splenomegally?, clumping) > -improving but still not above the 50 K. required by cardiology to perform catheterization 1 unit platelets    Anemia - stable - no clear source of bleeding, labs somewhat  worrisome for intravascular hemolysis likely due to cirrhosis  -PRBC for goal hgb > 8gm%   DM type II -Increase Lantus to 16 units daily -Increase SSI to resistant -A1c  -Lipid panel     Code Status: FULL Family Communication: no family present at time of exam Disposition Plan: Per cardiology    Consultants: Dr. Leodis Sias (cardiology) Dr. Casimiro Needle (nephrology)   Procedure/Significant Events: 10/15 hyperkalemia (on aldactone & lisinopril) requiring CRRT  CT head 10/15>>>neg, Small air-fluid level in the left maxillary sinus and right sphenoid sinus 10/03/14 : : Cooled to 33  Bradycardic , on max pressors  Anuric  Sedated , paralysed  On CRRT  10/04/14  On CRRT for high K., on pressors., Just rewarmed: following all commands, received 1 U PRBC  10/18 still on pressors, received 2 U PRBC  10/19 > extubated, off all pressors, following commands  10/20> passed swallow eval, CVVHD off, given lasix made great urine 10/22 CXR Moderate CHF with bilateral pleural effusions and mild pulmonary vascular congestion.     Culture 10/16 blood > neg    Antibiotics: 10/16 unasyn > 10/19    DVT prophylaxis: SCD   Devices    LINES / TUBES:  CVL R IJ CVL 10/15    Continuous Infusions: . sodium chloride 10 mL/hr at 10/08/14 0800    Objective: VITAL SIGNS: Temp: 98.3 F (36.8 C) (10/22 1203) Temp Source: Oral (10/22 1203) BP: 151/66 mmHg (10/22 1203) Pulse Rate: 90 (10/22 1203) SPO2; FIO2:   Intake/Output Summary (Last 24 hours) at 10/09/14 1341 Last data filed at 10/09/14 1033  Gross per 24 hour  Intake   1260 ml  Output   1650 ml  Net   -390 ml     Exam: General: A./O. x4, NAD, increased WOB, No acute respiratory distress Lungs: Dec BS RML/RLL, Left Lung CTA, without wheezes or crackles Cardiovascular: Regular rate and rhythm without murmur gallop or rub normal S1 and S2 Abdomen: Nontender, nondistended, soft, bowel sounds positive, no  rebound, no ascites, no appreciable mass Extremities: No significant cyanosis, clubbing, or edema bilateral lower extremities  Data Reviewed: Basic Metabolic Panel:  Recent Labs Lab 10/05/14 0223  10/06/14 0415 10/06/14 1500 10/07/14 0442 10/07/14 0450 10/07/14 1600 10/08/14 0445 10/09/14 0410  NA 136*  < > 139 138  --  137 137 139 140  K 4.9  < > 5.0 5.1  --  4.7 4.6 4.5 4.2  CL 103  < > 105 104  --  101 101 103 104  CO2 23  < > 27 26  --  25 25 26 28   GLUCOSE 162*  < > 235* 168*  --  147* 160* 159* 171*  BUN 25*  < > 35* 36*  --  37* 45* 52* 51*  CREATININE 1.64*  < > 1.48* 1.25  --  1.31 1.53* 1.62* 1.37*  CALCIUM 7.2*  < > 7.2* 7.5*  --  7.7* 7.8* 8.1* 8.1*  MG 2.1  --  2.3  --  2.5  --   --  2.5 2.7*  PHOS 3.5  < > 2.2* 2.5  --  2.8 3.0 3.4 3.3  < > = values in this interval not displayed. Liver Function Tests:  Recent Labs Lab 10/02/14 1734  10/07/14 0442 10/07/14 0450 10/07/14 1600 10/08/14 0445 10/09/14 0410  AST 44*  --  67*  --   --   --   --   ALT 35  --  44  --   --   --   --   ALKPHOS 117  --  112  --   --   --   --   BILITOT 0.8  --  1.4*  --   --   --   --   PROT 5.8*  --  4.8*  --   --   --   --   ALBUMIN 3.1*  < > 2.2* 2.2* 2.3* 2.4* 2.3*  < > = values in this interval not displayed. No results found for this basename: LIPASE, AMYLASE,  in the last 168 hours No results found for this basename: AMMONIA,  in the last 168 hours CBC:  Recent Labs Lab 10/02/14 1734  10/04/14 1700  10/06/14 0415 10/06/14 1500 10/07/14 0442 10/07/14 0930 10/08/14 0445 10/09/14 0410  WBC 12.2*  < > 5.5  < > 3.1* 3.4* 3.4*  --  4.6 4.7  NEUTROABS 10.7*  --  4.5  --   --   --  2.5  --   --   --   HGB 10.3*  < > 7.7*  < > 7.0* 8.2* 8.1*  --  8.3* 8.1*  HCT 30.0*  < > 22.6*  < > 20.6* 24.2* 23.8*  --  24.7* 24.4*  MCV 89.0  < > 88.3  < > 87.3 88.6 89.1  --  90.1 89.4  PLT 36*  < > 26*  < > 18* 17*  18* 19* 20* 34* 44*  < > = values in this interval not  displayed. Cardiac Enzymes:  Recent Labs Lab 10/02/14 2200 10/03/14 0400  TROPONINI 1.86* 1.72*  BNP (last 3 results) No results found for this basename: PROBNP,  in the last 8760 hours CBG:  Recent Labs Lab 10/08/14 1715 10/08/14 2128 10/09/14 0406 10/09/14 0813 10/09/14 1206  GLUCAP 269* 234* 173* 189* 304*    Recent Results (from the past 240 hour(s))  MRSA PCR SCREENING     Status: None   Collection Time    10/02/14  4:46 PM      Result Value Ref Range Status   MRSA by PCR NEGATIVE  NEGATIVE Final   Comment:            The GeneXpert MRSA Assay (FDA     approved for NASAL specimens     only), is one component of a     comprehensive MRSA colonization     surveillance program. It is not     intended to diagnose MRSA     infection nor to guide or     monitor treatment for     MRSA infections.  CULTURE, BLOOD (ROUTINE X 2)     Status: None   Collection Time    10/03/14 11:15 AM      Result Value Ref Range Status   Specimen Description BLOOD LEFT ARM   Final   Special Requests BOTTLES DRAWN AEROBIC ONLY 5 CC   Final   Culture  Setup Time     Final   Value: 10/03/2014 17:15     Performed at [facility]   Culture     Final   Value: NO GROWTH 5 DAYS     Performed at [facility]   Report Status 10/09/2014 FINAL   Final  CULTURE, BLOOD (ROUTINE X 2)     Status: None   Collection Time    10/03/14 11:25 AM      Result Value Ref Range Status   Specimen Description BLOOD LEFT HAND   Final   Special Requests BOTTLES DRAWN AEROBIC ONLY 2 CC   Final   Culture  Setup Time     Final   Value: 10/03/2014 17:13     Performed at [facility]   Culture     Final   Value: NO GROWTH 5 DAYS     Performed at [facility]   Report Status 10/09/2014 FINAL   Final     Studies:  Recent x-ray studies have been reviewed in detail by the Attending Physician  Scheduled Meds:  Scheduled Meds: . sodium chloride   Intravenous Once  . feeding  supplement (ENSURE COMPLETE)  237 mL Oral BID BM  . insulin aspart  0-15 Units Subcutaneous TID WC  . insulin glargine  10 Units Subcutaneous Daily  . pantoprazole (PROTONIX) IV  40 mg Intravenous Q12H  . sodium chloride  10-40 mL Intracatheter Q12H    Time spent on care of this patient: 40 mins   Drema Dallas , MD   [facility] Office  [phone] Pager - [phone]  On-Call/Text Page:      [email or web address]      password [email or web address]  If 7PM-7AM, please contact night-coverage [email or web address] Password [email or web address] 10/09/2014, 1:41 PM   LOS: 7 days

## 2014-10-09 NOTE — Progress Notes (Signed)
Using incentive spirometer  to 750 cc - 1150 cc mark corrrectly .

## 2014-10-09 NOTE — Progress Notes (Signed)
Inpatient Diabetes Program Recommendations  AACE/ADA: New Consensus Statement on Inpatient Glycemic Control (2013)  Target Ranges:  Prepandial:   less than 140 mg/dL      Peak postprandial:   less than 180 mg/dL (1-2 hours)      Critically ill patients:  140 - 180 mg/dL   Reason for assessment: elevated blood sugars  Diabetes history: Type 2 Outpatient Diabetes medications:Lantus 38 units q day, Novolog  0-10 units with meals Current orders for Inpatient glycemic control: Lantus 10 units q day, Novolog 0-15 units with meals  May want to consider changing Ensure to [facility] for less post meal elevation. Consider increasing Lantus to 15 units (elevated fasting blood sugars) and adding meal coverage: Novolog 3 units tid ac meals along with currently ordered Novolog correction.  Consider adding Novolog correction 0-5 units at hs.  Susette Racer, RN, BA, MHA, CDE Diabetes Coordinator Inpatient Diabetes Program  [phone] (Team Pager) [phone] [facility]) 10/09/2014 1:50 PM

## 2014-10-09 NOTE — Evaluation (Signed)
Physical Therapy Evaluation Patient Details Name: Ralph BuckerRobert A Walker MRN: [id number] DOB: 01/26/1961 Today's Date: 10/09/2014   History of Present Illness  Pt adm with cardiac arrest. Underwent artic sun. Pt with resp failure and intubated and acute kidney injury. Pt with CVVHD. PMH - ischemic cardiomyopathy EF 35-40%, cirrhosis, DM, lt toe amputation, peripheral neuropathy.  Clinical Impression  Pt admitted with above. Pt currently with functional limitations due to the deficits listed below (see PT Problem List).  Pt will benefit from skilled PT to increase their independence and safety with mobility to allow discharge to the venue listed below.       Follow Up Recommendations Home health PT;Supervision/Assistance - 24 hour    Equipment Recommendations  3in1 (PT)    Recommendations for Other Services OT consult     Precautions / Restrictions Precautions Precautions: Fall      Mobility  Bed Mobility                  Transfers Overall transfer level: Needs assistance Equipment used: Rolling Walker (2 wheeled) Transfers: Sit to/from UGI CorporationStand;Stand Pivot Transfers Sit to Stand: Min assist Stand pivot transfers: Min assist       General transfer comment: verbal cues for hand placement and assist to bring hips up.  Ambulation/Gait Ambulation/Gait assistance: Min assist Ambulation Distance (Feet): 200 Feet Assistive device: Rolling Walker (2 wheeled) Gait Pattern/deviations: Step-through pattern;Decreased step length - right;Decreased step length - left;Trunk flexed Gait velocity: decr Gait velocity interpretation: Below normal speed for age/gender General Gait Details: verbal cues to stand more erect  Stairs            Wheelchair Mobility    Modified Rankin (Stroke Patients Only)       Balance Overall balance assessment: Needs assistance Sitting-balance support: No upper extremity supported;Feet supported Sitting balance-Leahy Scale: Fair     Standing  balance support: Bilateral upper extremity supported Standing balance-Leahy Scale: Poor Standing balance comment: support of Walker                             Pertinent Vitals/Pain Pain Assessment: No/denies pain    Home Living Family/patient expects to be discharged to:: Private residence Living Arrangements: Other relatives (sister and disabled brother) Available Help at Discharge: Family Type of Home: House Home Access: Level entry     Home Layout: One level Home Equipment: Environmental consultantWalker - 2 wheels      Prior Function Level of Independence: Independent               Hand Dominance        Extremity/Trunk Assessment   Upper Extremity Assessment: Defer to OT evaluation           Lower Extremity Assessment: Generalized weakness         Communication   Communication: No difficulties  Cognition Arousal/Alertness: Awake/alert Behavior During Therapy: WFL for tasks assessed/performed Overall Cognitive Status: Within Functional Limits for tasks assessed                      General Comments      Exercises        Assessment/Plan    PT Assessment Patient needs continued PT services  PT Diagnosis Difficulty walking;Generalized weakness   PT Problem List Decreased strength;Decreased activity tolerance;Decreased balance;Decreased mobility;Decreased knowledge of use of DME;Obesity  PT Treatment Interventions DME instruction;Gait training;Functional mobility training;Therapeutic activities;Therapeutic exercise;Balance training;Patient/family education   PT Goals (Current  goals can be found in the Care Plan section) Acute Rehab PT Goals Patient Stated Goal: Return home PT Goal Formulation: With patient Time For Goal Achievement: 10/16/14 Potential to Achieve Goals: Good    Frequency Min 3X/week   Barriers to discharge        Co-evaluation               End of Session Equipment Utilized During Treatment: Gait belt Activity  Tolerance: Patient tolerated treatment well Patient left: in chair;with call bell/phone within reach;with family/visitor present Nurse Communication: Mobility status         Time: 1010-1031 PT Time Calculation (min): 21 min   Charges:   PT Evaluation $Initial PT Evaluation Tier I: 1 Procedure PT Treatments $Gait Training: 8-22 mins   PT G Codes:          [name] 11-04-2014, 12:39 PM  [facility] PT [phone]

## 2014-10-09 NOTE — Progress Notes (Signed)
Patient Name: [name] Date of Encounter: 10/09/2014     Principal Problem:   Cardiogenic shock Active Problems:   Cardiac arrest   Anemia due to other cause   Acute respiratory failure with hypoxia   AKI (acute kidney injury)    SUBJECTIVE  Patient has been extubated. He still having lots of nausea. He has not had a bowel movement in several days.  CURRENT MEDS . sodium chloride   Intravenous Once  . feeding supplement (ENSURE COMPLETE)  237 mL Oral BID BM  . insulin aspart  0-15 Units Subcutaneous TID WC  . insulin glargine  10 Units Subcutaneous Daily  . pantoprazole (PROTONIX) IV  40 mg Intravenous Q12H  . sodium chloride  10-40 mL Intracatheter Q12H    OBJECTIVE  Filed Vitals:   10/09/14 0400 10/09/14 0500 10/09/14 0600 10/09/14 0700  BP: 130/64 145/70 143/62 149/79  Pulse: 85 86 86 86  Temp: 98.2 F (36.8 C)   98.3 F (36.8 C)  TempSrc: Oral   Oral  Resp: 16 22 22 22   Height:      Weight:      SpO2: 100% 100% 98% 98%    Intake/Output Summary (Last 24 hours) at 10/09/14 1029 Last data filed at 10/09/14 0900  Gross per 24 hour  Intake   1420 ml  Output   1930 ml  Net   -510 ml   Filed Weights   10/06/14 0345 10/07/14 0600 10/08/14 0400  Weight: 262 lb 5.6 oz (119 kg) 258 lb 9.6 oz (117.3 kg) 257 lb 11.5 oz (116.9 kg)    PHYSICAL EXAM   HEENT:  Normal  Neck: Supple without bruits or JVD. Lungs:  Resp regular.    Clear anteriorly. Heart: RRR no s3, s4. Grade 2/6 systolic murmur at base. Abdomen:   Mild distention Extremities: No clubbing, cyanosis.  + anasarca .  DP/PT/Radials 2+ and equal bilaterally. Neuro:  Awake and alert    Accessory Clinical Findings  CBC  Recent Labs  10/07/14 0442  10/08/14 0445 10/09/14 0410  WBC 3.4*  --  4.6 4.7  NEUTROABS 2.5  --   --   --   HGB 8.1*  --  8.3* 8.1*  HCT 23.8*  --  24.7* 24.4*  MCV 89.1  --  90.1 89.4  PLT 19*  < > 34* 44*  < > = values in this interval not displayed. Basic  Metabolic Panel  Recent Labs  10/08/14 0445 10/09/14 0410  NA 139 140  K 4.5 4.2  CL 103 104  CO2 26 28  GLUCOSE 159* 171*  BUN 52* 51*  CREATININE 1.62* 1.37*  CALCIUM 8.1* 8.1*  MG 2.5 2.7*  PHOS 3.4 3.3   Liver Function Tests  Recent Labs  10/07/14 0442  10/08/14 0445 10/09/14 0410  AST 67*  --   --   --   ALT 44  --   --   --   ALKPHOS 112  --   --   --   BILITOT 1.4*  --   --   --   PROT 4.8*  --   --   --   ALBUMIN 2.2*  < > 2.4* 2.3*  < > = values in this interval not displayed. No results found for this basename: LIPASE, AMYLASE,  in the last 72 hours Cardiac Enzymes No results found for this basename: CKTOTAL, CKMB, CKMBINDEX, TROPONINI,  in the last 72 hours BNP No components found with this  basename: POCBNP,  D-Dimer  Recent Labs  10/06/14 1500  DDIMER 14.84*   Hemoglobin A1C No results found for this basename: HGBA1C,  in the last 72 hours Fasting Lipid Panel No results found for this basename: CHOL, HDL, LDLCALC, TRIG, CHOLHDL, LDLDIRECT,  in the last 72 hours Thyroid Function Tests No results found for this basename: TSH, T4TOTAL, FREET3, T3FREE, THYROIDAB,  in the last 72 hours  TELE  NSR  ECG on 10/07/14:  Normal sinus rhythm Anterolateral infarct ,  Inf. MI No changes from previous tracing  ASSESSMENT AND PLAN  1. Cardiac arrest  - likely primary event in the setting of nonischemic cardiomyopathy  S/p Artic Sun protocol.   Is awake and alert - ROSC after shocked x 2 by AED   2. H/o severe ischemic cardiomyopathy  - EF 35% by echo, 19% by stress test.  Consider repeat echo after extubation.  - Stress test noted inferior and apex scar  Needs to have a cath - will schedule for Friday.  Would prefer that he have a platelet count of 50K prior to cath.  Recheck CBC tomorrow  3. Severe thrombocytopenia, - improving  4. bicuspid aortic valve  5. H/o mild pericardial effusion on recent echo  6. h/o pancreatitis  7. H/o recent sepsis  with biliary origin: refused by surgery to cholecystectomy due to perioperative risk with h/o cirrhosis  8. Cirrhosis  9. Obesity  10. DM  11.Junctional rhythm resolved with rewarming.  12. Anemia.  13. Acute renal failure:    Will need to hydrate prior to cath.  Minimal contrast.     Alvia GrovePhilip J. Nahser, Jr., MD, [facility] 10/09/2014, 10:29 AM [address],  [address] Office [phone] Pager [phone]

## 2014-10-09 NOTE — Telephone Encounter (Signed)
[name] called, Oct 22 to let Dr. [name] know that he had a heart attack and was put in an induced coma. He is still in hospital and is very concerned he is not getting the adequate amount of insulin.  Pt wanted Dr. [name] to call the hospital and talk to the Doctor there. He put the nurse on the phone and she explained what they where doing and that [name] would get the correct amount of insulin. [name] asked that Dr. [name] call and check on him on Monday, Oct 26  upon his return to make sure everything is ok.

## 2014-10-09 NOTE — Progress Notes (Signed)
Patient ID: [name], male   DOB: 06/11/1961, 53 y.o.   MRN: [id number] S: Doing well, no complaints. Foley out and walking around (with some difficulty)  O:BP 143/62  Pulse 86  Temp(Src) 98.2 F (36.8 C) (Oral)  Resp 22  Ht 5\' 10"  (1.778 m)  Wt 257 lb 11.5 oz (116.9 kg)  BMI 36.98 kg/m2  SpO2 98%  Intake/Output Summary (Last 24 hours) at 10/09/14 0746 Last data filed at 10/09/14 0700  Gross per 24 hour  Intake   1430 ml  Output   1880 ml  Net   -450 ml   Intake/Output: I/O last 3 completed shifts: In: 1660 [P.O.:930; I.V.:470; Blood:260] Out: 3480 [Urine:3480]  Intake/Output this shift:    Weight change:   Gen: NAD Cardio: RRR, no m/r/g Resp: CTAB Abd: soft, no bowel sounds Ext: 1+ edema bilat LE. 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   Recent Labs Lab 10/02/14 1734  10/05/14 1550 10/06/14 0415 10/06/14 1500 10/07/14 0442 10/07/14 0450 10/07/14 1600 10/08/14 0445 10/09/14 0410  NA 135*  < > 133* 139 138  --  137 137 139 140  K 6.9*  < > 5.0 5.0 5.1  --  4.7 4.6 4.5 4.2  CL 106  < > 102 105 104  --  101 101 103 104  CO2 19  < > 25 27 26   --  25 25 26 28   GLUCOSE 183*  < > 262* 235* 168*  --  147* 160* 159* 171*  BUN 41*  < > 30* 35* 36*  --  37* 45* 52* 51*  CREATININE 1.38*  < > 1.57* 1.48* 1.25  --  1.31 1.53* 1.62* 1.37*  ALBUMIN 3.1*  < > 2.1* 2.1* 2.3* 2.2* 2.2* 2.3* 2.4* 2.3*  CALCIUM 7.5*  < > 7.0* 7.2* 7.5*  --  7.7* 7.8* 8.1* 8.1*  PHOS  --   < > 2.8 2.2* 2.5  --  2.8 3.0 3.4 3.3  AST 44*  --   --   --   --  67*  --   --   --   --   ALT 35  --   --   --   --  44  --   --   --   --   < > = values in this interval not displayed. Liver Function Tests:  Recent Labs Lab 10/02/14 1734  10/07/14 0442  10/07/14 1600 10/08/14 0445 10/09/14 0410  AST 44*  --  67*  --   --   --   --   ALT 35  --  44  --   --   --   --   ALKPHOS 117  --  112  --   --   --   --   BILITOT 0.8  --  1.4*  --   --   --   --   PROT 5.8*  --  4.8*  --   --    --   --   ALBUMIN 3.1*  < > 2.2*  < > 2.3* 2.4* 2.3*  < > = values in this interval not displayed. No results found for this basename: LIPASE, AMYLASE,  in the last 168 hours No results found for this basename: AMMONIA,  in the last 168 hours CBC:  Recent Labs Lab 10/02/14 1734  10/04/14 1700  10/06/14 0415 10/06/14 1500 10/07/14 0442 10/07/14 0930 10/08/14 0445 10/09/14 0410  WBC 12.2*  < >  5.5  < > 3.1* 3.4* 3.4*  --  4.6 4.7  NEUTROABS 10.7*  --  4.5  --   --   --  2.5  --   --   --   HGB 10.3*  < > 7.7*  < > 7.0* 8.2* 8.1*  --  8.3* 8.1*  HCT 30.0*  < > 22.6*  < > 20.6* 24.2* 23.8*  --  24.7* 24.4*  MCV 89.0  < > 88.3  < > 87.3 88.6 89.1  --  90.1 89.4  PLT 36*  < > 26*  < > 18* 17*  18* 19* 20* 34* 44*  < > = values in this interval not displayed. Cardiac Enzymes:  Recent Labs Lab 10/02/14 2200 10/03/14 0400  TROPONINI 1.86* 1.72*   CBG:  Recent Labs Lab 10/08/14 0733 10/08/14 1129 10/08/14 1715 10/08/14 2128 10/09/14 0406  GLUCAP 145* 275* 269* 234* 173*    Iron Studies: No results found for this basename: IRON, TIBC, TRANSFERRIN, FERRITIN,  in the last 72 hours  Assessment/Plan: 53 y.o. male with PMH CHF, cirrhosis, DM, admitted after cardiac arrest on 10/15, CPR x 10 min with ROSC, underwent cooling protocol. Developed oliguria and had CVVHD x 5 days (d/c'd 10/19).  1. ARF- in setting of cardiac arrest while on lisinopril. Scr downtrending/stable. 2. Hyperkalemia- Resolved. 3. Anemia- normocytic.  S/p 1u 10/17 and 2u 10/18. DIC panel positive. Hgb now appears to be stable. 4. Thrombocytopenia: improving 5. Hypocalcemia - improved. 6. DM- per PCCM 7. Cirrhosis 8. Thrombocytopenia - stable at 18  Renal function continues to improve. Will sign off now. Please re-consult if assistance needed.  Tawni CarnesWight, Andrew 10/09/2014, 7:46 AM   Renal Attending: He has made good progress.  Renal function has continued to improve and he is nonoliguric.  We will  sign off.  Please limit contrat dye if possible if heart catheterization done. Fortunato Nordin C

## 2014-10-10 ENCOUNTER — Encounter (HOSPITAL_COMMUNITY)
Admission: EM | Disposition: A | Discharge status: Home Health | Payer: Self-pay | Source: Home / Self Care | Attending: Pulmonary Disease

## 2014-10-10 DIAGNOSIS — I2510 Atherosclerotic heart disease of native coronary artery without angina pectoris: Secondary | ICD-10-CM

## 2014-10-10 HISTORY — PX: LEFT HEART CATHETERIZATION WITH CORONARY ANGIOGRAM: SHX5451

## 2014-10-10 LAB — CBC WITH DIFFERENTIAL/PLATELET
Basophils Absolute: 0 10*3/uL (ref 0.0–0.1)
Basophils Relative: 0 % (ref 0–1)
EOS ABS: 0.1 10*3/uL (ref 0.0–0.7)
Eosinophils Relative: 3 % (ref 0–5)
HCT: 25.2 % — ABNORMAL LOW (ref 39.0–52.0)
Hemoglobin: 8.5 g/dL — ABNORMAL LOW (ref 13.0–17.0)
LYMPHS ABS: 0.5 10*3/uL — AB (ref 0.7–4.0)
LYMPHS PCT: 11 % — AB (ref 12–46)
MCH: 30.4 pg (ref 26.0–34.0)
MCHC: 33.7 g/dL (ref 30.0–36.0)
MCV: 90 fL (ref 78.0–100.0)
Monocytes Absolute: 0.5 10*3/uL (ref 0.1–1.0)
Monocytes Relative: 9 % (ref 3–12)
Neutro Abs: 3.7 10*3/uL (ref 1.7–7.7)
Neutrophils Relative %: 76 % (ref 43–77)
PLATELETS: 58 10*3/uL — AB (ref 150–400)
RBC: 2.8 MIL/uL — ABNORMAL LOW (ref 4.22–5.81)
RDW: 17.3 % — ABNORMAL HIGH (ref 11.5–15.5)
WBC: 4.8 10*3/uL (ref 4.0–10.5)

## 2014-10-10 LAB — RENAL FUNCTION PANEL
ALBUMIN: 2.3 g/dL — AB (ref 3.5–5.2)
Anion gap: 9 (ref 5–15)
BUN: 42 mg/dL — ABNORMAL HIGH (ref 6–23)
CALCIUM: 8.3 mg/dL — AB (ref 8.4–10.5)
CO2: 29 mEq/L (ref 19–32)
Chloride: 101 mEq/L (ref 96–112)
Creatinine, Ser: 1.17 mg/dL (ref 0.50–1.35)
GFR, EST AFRICAN AMERICAN: 81 mL/min — AB (ref >90)
GFR, EST NON AFRICAN AMERICAN: 70 mL/min — AB (ref >90)
Glucose, Bld: 145 mg/dL — ABNORMAL HIGH (ref 70–99)
PHOSPHORUS: 4.3 mg/dL (ref 2.3–4.6)
Potassium: 4.1 mEq/L (ref 3.7–5.3)
SODIUM: 139 meq/L (ref 137–147)

## 2014-10-10 LAB — LIPID PANEL
CHOL/HDL RATIO: 5.3 ratio
Cholesterol: 106 mg/dL (ref 0–200)
HDL: 20 mg/dL — AB (ref >39)
LDL CALC: 67 mg/dL (ref 0–99)
TRIGLYCERIDES: 96 mg/dL (ref <150)
VLDL: 19 mg/dL (ref 0–40)

## 2014-10-10 LAB — HEMOGLOBIN A1C
HEMOGLOBIN A1C: 5.8 % — AB (ref <5.7)
Mean Plasma Glucose: 120 mg/dL — ABNORMAL HIGH (ref <117)

## 2014-10-10 LAB — GLUCOSE, CAPILLARY
GLUCOSE-CAPILLARY: 110 mg/dL — AB (ref 70–99)
GLUCOSE-CAPILLARY: 113 mg/dL — AB (ref 70–99)
GLUCOSE-CAPILLARY: 141 mg/dL — AB (ref 70–99)
GLUCOSE-CAPILLARY: 183 mg/dL — AB (ref 70–99)
GLUCOSE-CAPILLARY: 220 mg/dL — AB (ref 70–99)
Glucose-Capillary: 102 mg/dL — ABNORMAL HIGH (ref 70–99)
Glucose-Capillary: 154 mg/dL — ABNORMAL HIGH (ref 70–99)

## 2014-10-10 LAB — PREPARE PLATELET PHERESIS: UNIT DIVISION: 0

## 2014-10-10 LAB — MAGNESIUM: Magnesium: 2.3 mg/dL (ref 1.5–2.5)

## 2014-10-10 SURGERY — LEFT HEART CATHETERIZATION WITH CORONARY ANGIOGRAM
Anesthesia: LOCAL

## 2014-10-10 MED ORDER — VERAPAMIL HCL 2.5 MG/ML IV SOLN
INTRAVENOUS | Status: AC
  Filled 2014-10-10: qty 2

## 2014-10-10 MED ORDER — NITROGLYCERIN 1 MG/10 ML FOR IR/CATH LAB
INTRA_ARTERIAL | Status: AC
  Filled 2014-10-10: qty 10

## 2014-10-10 MED ORDER — HEPARIN SODIUM (PORCINE) 1000 UNIT/ML IJ SOLN
INTRAMUSCULAR | Status: AC
  Filled 2014-10-10: qty 1

## 2014-10-10 MED ORDER — SODIUM CHLORIDE 0.9 % IV SOLN: INTRAVENOUS | Status: DC

## 2014-10-10 MED ORDER — SODIUM CHLORIDE 0.9 % IJ SOLN: 3.0000 mL | Freq: Two times a day (BID) | INTRAMUSCULAR | Status: DC

## 2014-10-10 MED ORDER — FUROSEMIDE 40 MG PO TABS
40.0000 mg | ORAL_TABLET | Freq: Two times a day (BID) | ORAL | Status: DC
  Administered 2014-10-10 – 2014-10-12 (×6): 40 mg via ORAL
  Filled 2014-10-10 (×9): qty 1

## 2014-10-10 MED ORDER — SODIUM CHLORIDE 0.9 % IV SOLN: INTRAVENOUS | Status: AC

## 2014-10-10 MED ORDER — MIDAZOLAM HCL 2 MG/2ML IJ SOLN
INTRAMUSCULAR | Status: AC
  Filled 2014-10-10: qty 2

## 2014-10-10 MED ORDER — PANTOPRAZOLE SODIUM 40 MG PO TBEC
40.0000 mg | DELAYED_RELEASE_TABLET | Freq: Two times a day (BID) | ORAL | Status: DC
  Administered 2014-10-10 – 2014-10-18 (×17): 40 mg via ORAL
  Filled 2014-10-10 (×17): qty 1

## 2014-10-10 MED ORDER — CARVEDILOL 3.125 MG PO TABS
3.1250 mg | ORAL_TABLET | Freq: Two times a day (BID) | ORAL | Status: DC
  Administered 2014-10-10 – 2014-10-13 (×7): 3.125 mg via ORAL
  Filled 2014-10-10 (×10): qty 1

## 2014-10-10 MED ORDER — ASPIRIN 81 MG PO CHEW
81.0000 mg | CHEWABLE_TABLET | ORAL | Status: DC
Start: 2014-10-11 — End: 2014-10-10

## 2014-10-10 MED ORDER — LIDOCAINE HCL (PF) 1 % IJ SOLN
INTRAMUSCULAR | Status: AC
  Filled 2014-10-10: qty 30

## 2014-10-10 MED ORDER — SODIUM CHLORIDE 0.9 % IJ SOLN: 3.0000 mL | INTRAMUSCULAR | Status: DC | PRN

## 2014-10-10 MED ORDER — ASPIRIN 81 MG PO CHEW
81.0000 mg | CHEWABLE_TABLET | ORAL | Status: AC
  Administered 2014-10-10: 81 mg via ORAL
  Filled 2014-10-10: qty 1

## 2014-10-10 MED ORDER — SODIUM CHLORIDE 0.9 % IV SOLN
INTRAVENOUS | Status: DC
  Administered 2014-10-10: 09:00:00 via INTRAVENOUS

## 2014-10-10 MED ORDER — HEPARIN (PORCINE) IN NACL 2-0.9 UNIT/ML-% IJ SOLN
INTRAMUSCULAR | Status: AC
  Filled 2014-10-10: qty 1000

## 2014-10-10 MED ORDER — SODIUM CHLORIDE 0.9 % IV SOLN: 250.0000 mL | INTRAVENOUS | Status: DC | PRN

## 2014-10-10 NOTE — Consult Note (Signed)
Reason for Consult:   Life Vest  Requesting Physician: Dr Elease Hashimoto  HPI:   This is a 53 y.o. male, works at [employer], with a past medical history significant for DCM, presumed nonischemic with nuclear stress test in 07/2014 showing no ischemia and EF 19%, echo 07/19/14- EF 35-40%. He was placed on medical therapy and had been doing well. 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 He also has a history of gallstone pancreatitis but was deemed not to be a surgical candidate for cholecystectomy Aug 2015. He was also not felt to be a surgical liver transplant candidate both due to multiple comorbidites. 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 A 2D echo Feb 2015 showed mild AS with small PFO by TEE and bicuspid AV with mild AS and normal LVF. Repeat echo 8/15 when hospitalized for gram negative sepsis with E coli with presumed biliary source, showed significant reduction in EF at 35-40% with diastolic dysfunction and mild AS. Nuclear stress test 07/23/14 showed no evidence of ischemia and a fixed defect in the inferior wall and apex. He was treated medically.               On 10/02/14 he was at work.  A coworker stated that the patient said that he felt the best he has felt in a while. He was filling out a paper and collapsed in front of witnesses, hitting his head on a piece of furniture. CPR was started immediately and an AED was placed and shocked for presumed Vfib. On EMS arrival he was in sinus tachycardia. He was awake but confused and there was concern whether he could protect his airway and he was sedated and intubated and cooling process begun.  He has gradually recovered. He is extubated and had diagnostic cath today revealing severe 3V CAD. He is not felt to be a candidate for CABG secondary to co morbidities and is not  a candidate for PCI secondary to thrombocytopenia. We are asked to see for recommendations Life Vest- ? ICD.     PMHx:  Past Medical History  Diagnosis Date  . Heart murmur     ECHO 07/2011 showed mild AS and LVH.  TEE 09/2011 showed small PFO, bicuspid aortic valve but no stenosis or regurg.  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  Gen surg and GI recommended flagyl and cipro x 2 wks..  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  . Cholelithiasis     u/s--no cholecystitis.  06/2014 u/s showed no stones, only GB sludge  . Peripheral neuropathy     Diabetic:  Autonomic (pelvic) and LE polyneuropathy--[facility] testing showed that it is likely from DM  . Diabetic foot ulcers     Poor healing; normal ABIs and TBIs  . Arthritis     back L3 - L5  . Hyperactive gag reflex   . History of pyelonephritis   . Type II or unspecified type diabetes mellitus with neurological manifestations, not stated as uncontrolled     IDDM: neuro, renal, and ophth complications  . Wears dentures     upper  . Osteomyelitis of toe of left foot  left 4th and 5th toes--now s/p amputation of these areas  . Diabetic retinopathy associated with type 2 diabetes mellitus     Laser tx in both eyes (Dr. Allyne Gee)  . Diabetic nephropathy     Proteinuria and CrCl 55-65  . Chronic renal insufficiency, stage III (moderate)   . Lumbar spondylosis     MRI 08/25/11 with multilevel facet dz, small disc protrusion at L5-S1 to the right without impingement  . Ischemic cardiomyopathy 07/22/14    Myoview w/out ischemia but large scar (inferior/apex) w/ global hypokinesis--medical mgmt  . History of pancreatitis 2014 and 2015    Suspected biliary: passing small gallstones: surgery declined to remove GB 07/2014.  . Aspiration pneumonia   . Thrombocytopenia   . Portal  hypertension   . Liver cirrhosis secondary to NASH     ?+ alcohol?.  With hx of splenomegaly and ascities (dx approx 2012)  . History of gram negative sepsis 2015    e coli    Past Surgical History  Procedure Laterality Date  . Knee arthroscopy  2001    Bilateral  . Amputation  10/24/2012    Procedure: AMPUTATION RAY;  Surgeon: Sherri Rad, MD;  Location: [facility];  Service: Orthopedics;  Laterality: Left;  left 4th toe amputation through MTP joint, 5th Ray amputation   . Knee surgery  2001  . Hida scan  06/2014    Normal  . Transthoracic echocardiogram  07/19/14    Mild LV dilation and hypertrophy, EF 35-40%, no wall motion abnormalities, grade II diast dysfxn, bicuspid aortic valve with mild AS and mild AI.  [name] Cardiovascular stress test  07/2014    Myoview w/out ischemia but large scar (inferior/apex) w/ global hypokinesis--medical mgmt  . Eye surgery  08/2014    Retinal hemorrhage evacuation    SOCHx:  reports that he has never smoked. His smokeless tobacco use includes Chew. He reports that he does not drink alcohol or use illicit drugs.  FAMHx: Family History  Problem Relation Age of Onset  . Heart disease Father   . Diabetes type II Sister     ALLERGIES: Allergies  Allergen Reactions  . Iodine Other (See Comments)    Family history of anaphylaxis, unsure if patient has reaction  . Other Shortness Of Breath, Itching and Other (See Comments)    PERFUMES - SNEEZING  . Hydralazine Hcl Hives  . Naproxen Nausea And Vomiting    ROS: Pertinent items are noted in HPI.  HOME MEDICATIONS: Prior to Admission medications   Medication Sig Start Date End Date Taking? Authorizing Provider  acetaminophen (TYLENOL) 500 MG tablet Take 500 mg by mouth every 6 (six) hours as needed for moderate pain.   Yes Historical Provider, MD  carvedilol (COREG) 3.125 MG tablet Take 1 tablet (3.125 mg total) by mouth 2 (two) times daily with a meal. 08/08/14  Yes [name], MD  furosemide (LASIX) 40 MG tablet Take 40 mg by mouth 2 (two) times daily.   Yes Historical Provider, MD  insulin aspart (NOVOLOG) 100 UNIT/ML injection Inject 0-10 Units into the skin 3 (three) times daily before meals. Sliding scale   Yes Historical Provider, MD  insulin glargine (LANTUS) 100 UNIT/ML injection Inject 38 Units into the skin daily. Patient advised to take insulin in the morning after working nights;  If working first or second shifts, takes insulin at bedtime   Yes Historical Provider, MD  pantoprazole (PROTONIX) 40 MG tablet Take 1 tablet (40  mg total) by mouth daily. 07/04/14  Yes Jeoffrey MassedPhilip H McGowen, MD  ranitidine (ZANTAC) 150 MG tablet Take 150 mg by mouth daily as needed for heartburn.   Yes Historical Provider, MD  spironolactone (ALDACTONE) 100 MG tablet Take 1 tablet (100 mg total) by mouth daily. 08/30/14  Yes Jeoffrey MassedPhilip H McGowen, MD  glucagon 1 MG injection Inject 1 mg into the vein once as needed. For hypoglycemia 02/25/14   Jeoffrey MassedPhilip H McGowen, MD    HOSPITAL MEDICATIONS: I have reviewed the patient's current medications.  VITALS: Blood pressure 153/66, pulse 116, temperature 97.8 F (36.6 C), temperature source Oral, resp. rate 18, height 5\' 10"  (1.778 m), weight 259 lb 7.7 oz (117.7 kg), SpO2 100.00%.  PHYSICAL EXAM: General appearance: alert, cooperative, no distress and moderately obese Neck: no carotid bruit and no JVD Lungs: decreased BS at bases Heart: regular rate and rhythm Abdomen: obese, non tender Extremities: chronic venous changes, trace pitting edema Pulses: diminnished Skin: pale, mildly icteric Neurologic: Grossly normal  LABS: Results for orders placed during the hospital encounter of 10/02/14 (from the past 24 hour(s))  PREPARE PLATELET PHERESIS     Status: None   Collection Time    10/09/14  2:03 PM      Result Value Ref Range   Unit Number [id number]     Blood Component Type PLTP LR2 PAS     Unit division 00     Status of Unit  ISSUED,FINAL     Transfusion Status OK TO TRANSFUSE    GLUCOSE, CAPILLARY     Status: Abnormal   Collection Time    10/09/14  3:37 PM      Result Value Ref Range   Glucose-Capillary 218 (*) 70 - 99 mg/dL  TYPE AND SCREEN     Status: None   Collection Time    10/09/14  4:25 PM      Result Value Ref Range   ABO/RH(D) B POS     Antibody Screen NEG     Sample Expiration 10/12/2014    GLUCOSE, CAPILLARY     Status: Abnormal   Collection Time    10/09/14  8:03 PM      Result Value Ref Range   Glucose-Capillary 158 (*) 70 - 99 mg/dL  PROTIME-INR     Status: None   Collection Time    10/09/14  9:02 PM      Result Value Ref Range   Prothrombin Time 13.9  11.6 - 15.2 seconds   INR 1.06  0.00 - 1.49  GLUCOSE, CAPILLARY     Status: Abnormal   Collection Time    10/10/14 12:16 AM      Result Value Ref Range   Glucose-Capillary 183 (*) 70 - 99 mg/dL   Comment 1 Capillary Sample    CBC WITH DIFFERENTIAL     Status: Abnormal   Collection Time    10/10/14  2:00 AM      Result Value Ref Range   WBC 4.8  4.0 - 10.5 K/uL   RBC 2.80 (*) 4.22 - 5.81 MIL/uL   Hemoglobin 8.5 (*) 13.0 - 17.0 g/dL   HCT 32.425.2 (*) 40.139.0 - 02.752.0 %   MCV 90.0  78.0 - 100.0 fL   MCH 30.4  26.0 - 34.0 pg   MCHC 33.7  30.0 - 36.0 g/dL   RDW 25.317.3 (*) 66.411.5 - 40.315.5 %   Platelets 58 (*) 150 - 400 K/uL   Neutrophils Relative % 76  43 - 77 %  Neutro Abs 3.7  1.7 - 7.7 K/uL   Lymphocytes Relative 11 (*) 12 - 46 %   Lymphs Abs 0.5 (*) 0.7 - 4.0 K/uL   Monocytes Relative 9  3 - 12 %   Monocytes Absolute 0.5  0.1 - 1.0 K/uL   Eosinophils Relative 3  0 - 5 %   Eosinophils Absolute 0.1  0.0 - 0.7 K/uL   Basophils Relative 0  0 - 1 %   Basophils Absolute 0.0  0.0 - 0.1 K/uL  RENAL FUNCTION PANEL     Status: Abnormal   Collection Time    10/10/14  2:30 AM      Result Value Ref Range   Sodium 139  137 - 147 mEq/L   Potassium 4.1  3.7 - 5.3 mEq/L   Chloride 101  96 - 112 mEq/L   CO2 29  19 - 32 mEq/L   Glucose, Bld 145  (*) 70 - 99 mg/dL   BUN 42 (*) 6 - 23 mg/dL   Creatinine, Ser 1.61  0.50 - 1.35 mg/dL   Calcium 8.3 (*) 8.4 - 10.5 mg/dL   Phosphorus 4.3  2.3 - 4.6 mg/dL   Albumin 2.3 (*) 3.5 - 5.2 g/dL   GFR calc non Af Amer 70 (*) >90 mL/min   GFR calc Af Amer 81 (*) >90 mL/min   Anion gap 9  5 - 15  MAGNESIUM     Status: None   Collection Time    10/10/14  2:30 AM      Result Value Ref Range   Magnesium 2.3  1.5 - 2.5 mg/dL  HEMOGLOBIN W9U     Status: Abnormal   Collection Time    10/10/14  2:30 AM      Result Value Ref Range   Hemoglobin A1C 5.8 (*) <5.7 %   Mean Plasma Glucose 120 (*) <117 mg/dL  GLUCOSE, CAPILLARY     Status: Abnormal   Collection Time    10/10/14  3:48 AM      Result Value Ref Range   Glucose-Capillary 102 (*) 70 - 99 mg/dL   Comment 1 Capillary Sample    LIPID PANEL     Status: Abnormal   Collection Time    10/10/14  4:30 AM      Result Value Ref Range   Cholesterol 106  0 - 200 mg/dL   Triglycerides 96  <045 mg/dL   HDL 20 (*) >40 mg/dL   Total CHOL/HDL Ratio 5.3     VLDL 19  0 - 40 mg/dL   LDL Cholesterol 67  0 - 99 mg/dL  GLUCOSE, CAPILLARY     Status: Abnormal   Collection Time    10/10/14  6:07 AM      Result Value Ref Range   Glucose-Capillary 113 (*) 70 - 99 mg/dL   Comment 1 Capillary Sample    GLUCOSE, CAPILLARY     Status: Abnormal   Collection Time    10/10/14  8:36 AM      Result Value Ref Range   Glucose-Capillary 110 (*) 70 - 99 mg/dL    EKG:   IMAGING: Dg Chest Port 1 View  10/09/2014   CLINICAL DATA:  History of cardiac arrest last week ; persistent chest soreness and shortness of breath ; history of CHF and diabetes  EXAM: PORTABLE CHEST - 1 VIEW  COMPARISON:  Portable chest x-ray of October 05, 2014  FINDINGS: The lung volumes remain low. The left hemidiaphragm remains  obscured. There is partial obscuration of the right hemidiaphragm today. The cardiopericardial silhouette remains enlarged. The pulmonary vascularity is mildly engorged  but has improved somewhat since the previous study. The trachea and esophagus have been extubated. A right internal jugular venous catheter tip projects over the midportion of the SVC.  IMPRESSION: Moderate CHF with bilateral pleural effusions and mild pulmonary vascular congestion. The findings are accentuated by hypoinflation. When the patient can tolerate the procedure, a PA and lateral chest x-ray with deep inspiration would be useful.   Electronically Signed   By: David  [place]   On: 10/09/2014 15:06    IMPRESSION: 1. Cardiac arrest 10/02/14, secondary to ventricular fibrillation, status post successful defibrillation S/p Artic Sun protocol. Is awake and alert  - ROSC after shocked x 2 by AED   2. H/o severe ischemic cardiomyopathy  - EF 35% - 40% by echo 07/19/14, 19% by stress test.  3. CAD - he has a tight stenosis in the distal RCA and mid LAD. He has an inferior scar and an apical scar.  CABG not a good option with multiple co morbidities. Consider PCI but only after his platelet function has improved. In addition, Plavix is hepatically metabolized and we would need to consider whether or not it would be active in him.   4. Severe thrombocytopenia, - improving   5. bicuspid aortic valve with mild AS 6. H/o mild pericardial effusion on recent echo  7. h/o pancreatitis  8. H/o recent sepsis with biliary origin: refused by surgery to cholecystectomy due to perioperative risk with h/o cirrhosis  9. Cirrhosis - severe 10. Obesity  11. DM  11.Junctional rhythm resolved with rewarming.  12. Anemia    RECOMMENDATION: Will review with Dr Ladona Ridgel.  He appears to be at high risk for another event.   Time Spent Directly with Patient: 40 minutes  Abelino Derrick [phone] beeper 10/10/2014, 12:14 PM   EP attending Patient seen and examined. I've reviewed the findings as documented by Mr. Diona Fanti, and ammended his note as necessary. The patient has had a resuscitated cardiac arrest  secondary to ventricular fibrillation. He has severe three-vessel coronary artery disease, it is not felt to be a revascularization candidate. He has severe left ventricular dysfunction. In addition, he has cirrhosis secondary to alcohol abuse. He has abstained from alcohol for 2 years. He has thrombocytopenia, presumably secondary to cirrhosis. Unfortunately, there is no good option for Mr. Onder. He is not a candidate for ICD implantation at the present time. Unless something changes, he would fall in the less than one year of longevity, which would preclude an ICD. This is based on the patient's severe left ventricular dysfunction, critical and neurovascular rise of coronary disease at present, as well as cirrhosis. The chief reason that the patient has not been felt to be a revascularization candidate is due to his thrombocytopenia. If the patient could be revascularized, and if his GI physician thinks that his liver disease is actually not too bad, ICD implantation might be a possibility in the future. While this is being sorted out, I would recommend the patient have a life vest placed to protect him against recurrent ventricular arrhythmias. I would anticipate recommending a life vest as a bridge to possible revascularization. Overall his prognosis is guarded.  Lewayne Bunting, M.D.

## 2014-10-10 NOTE — H&P (View-Only) (Signed)
Patient Name: [name] Date of Encounter: 10/09/2014     Principal Problem:   Cardiogenic shock Active Problems:   Cardiac arrest   Anemia due to other cause   Acute respiratory failure with hypoxia   AKI (acute kidney injury)    SUBJECTIVE  Patient has been extubated. He still having lots of nausea. He has not had a bowel movement in several days.  CURRENT MEDS . sodium chloride   Intravenous Once  . feeding supplement (ENSURE COMPLETE)  237 mL Oral BID BM  . insulin aspart  0-15 Units Subcutaneous TID WC  . insulin glargine  10 Units Subcutaneous Daily  . pantoprazole (PROTONIX) IV  40 mg Intravenous Q12H  . sodium chloride  10-40 mL Intracatheter Q12H    OBJECTIVE  Filed Vitals:   10/09/14 0400 10/09/14 0500 10/09/14 0600 10/09/14 0700  BP: 130/64 145/70 143/62 149/79  Pulse: 85 86 86 86  Temp: 98.2 F (36.8 C)   98.3 F (36.8 C)  TempSrc: Oral   Oral  Resp: 16 22 22 22   Height:      Weight:      SpO2: 100% 100% 98% 98%    Intake/Output Summary (Last 24 hours) at 10/09/14 1029 Last data filed at 10/09/14 0900  Gross per 24 hour  Intake   1420 ml  Output   1930 ml  Net   -510 ml   Filed Weights   10/06/14 0345 10/07/14 0600 10/08/14 0400  Weight: 262 lb 5.6 oz (119 kg) 258 lb 9.6 oz (117.3 kg) 257 lb 11.5 oz (116.9 kg)    PHYSICAL EXAM   HEENT:  Normal  Neck: Supple without bruits or JVD. Lungs:  Resp regular.    Clear anteriorly. Heart: RRR no s3, s4. Grade 2/6 systolic murmur at base. Abdomen:   Mild distention Extremities: No clubbing, cyanosis.  + anasarca .  DP/PT/Radials 2+ and equal bilaterally. Neuro:  Awake and alert    Accessory Clinical Findings  CBC  Recent Labs  10/07/14 0442  10/08/14 0445 10/09/14 0410  WBC 3.4*  --  4.6 4.7  NEUTROABS 2.5  --   --   --   HGB 8.1*  --  8.3* 8.1*  HCT 23.8*  --  24.7* 24.4*  MCV 89.1  --  90.1 89.4  PLT 19*  < > 34* 44*  < > = values in this interval not displayed. Basic  Metabolic Panel  Recent Labs  10/08/14 0445 10/09/14 0410  NA 139 140  K 4.5 4.2  CL 103 104  CO2 26 28  GLUCOSE 159* 171*  BUN 52* 51*  CREATININE 1.62* 1.37*  CALCIUM 8.1* 8.1*  MG 2.5 2.7*  PHOS 3.4 3.3   Liver Function Tests  Recent Labs  10/07/14 0442  10/08/14 0445 10/09/14 0410  AST 67*  --   --   --   ALT 44  --   --   --   ALKPHOS 112  --   --   --   BILITOT 1.4*  --   --   --   PROT 4.8*  --   --   --   ALBUMIN 2.2*  < > 2.4* 2.3*  < > = values in this interval not displayed. No results found for this basename: LIPASE, AMYLASE,  in the last 72 hours Cardiac Enzymes No results found for this basename: CKTOTAL, CKMB, CKMBINDEX, TROPONINI,  in the last 72 hours BNP No components found with this  basename: POCBNP,  D-Dimer  Recent Labs  10/06/14 1500  DDIMER 14.84*   Hemoglobin A1C No results found for this basename: HGBA1C,  in the last 72 hours Fasting Lipid Panel No results found for this basename: CHOL, HDL, LDLCALC, TRIG, CHOLHDL, LDLDIRECT,  in the last 72 hours Thyroid Function Tests No results found for this basename: TSH, T4TOTAL, FREET3, T3FREE, THYROIDAB,  in the last 72 hours  TELE  NSR  ECG on 10/07/14:  Normal sinus rhythm Anterolateral infarct ,  Inf. MI No changes from previous tracing  ASSESSMENT AND PLAN  1. Cardiac arrest  - likely primary event in the setting of nonischemic cardiomyopathy  S/p Artic Sun protocol.   Is awake and alert - ROSC after shocked x 2 by AED   2. H/o severe ischemic cardiomyopathy  - EF 35% by echo, 19% by stress test.  Consider repeat echo after extubation.  - Stress test noted inferior and apex scar  Needs to have a cath - will schedule for Friday.  Would prefer that he have a platelet count of 50K prior to cath.  Recheck CBC tomorrow  3. Severe thrombocytopenia, - improving  4. bicuspid aortic valve  5. H/o mild pericardial effusion on recent echo  6. h/o pancreatitis  7. H/o recent sepsis  with biliary origin: refused by surgery to cholecystectomy due to perioperative risk with h/o cirrhosis  8. Cirrhosis  9. Obesity  10. DM  11.Junctional rhythm resolved with rewarming.  12. Anemia.  13. Acute renal failure:    Will need to hydrate prior to cath.  Minimal contrast.     Philip J. Nahser, Jr., MD, FACC 10/09/2014, 10:29 AM [address],  [address] Office - [phone] Pager [phone]   

## 2014-10-10 NOTE — CV Procedure (Signed)
    Cardiac Catheterization Procedure Note  Name: [name] MRN: [id number] DOB: May 03, 1961  Procedure: Left Heart Cath, Selective Coronary Angiography  Indication: 53 yo WM post cardiac arrest with cardiomyopathy.   Procedural Details: The right wrist was prepped, draped, and anesthetized with 1% lidocaine. Using the modified Seldinger technique, a 6 French slender sheath was introduced into the right radial artery. 3 mg of verapamil was administered through the sheath, weight-based unfractionated heparin was administered intravenously. Standard Judkins catheters were used for selective coronary angiography and left ventricular pressures. Catheter exchanges were performed over an exchange length guidewire. There were no immediate procedural complications. A TR band was used for radial hemostasis at the completion of the procedure.  The patient was transferred to the post catheterization recovery area for further monitoring. 60 cc of contrast used.  Procedural Findings: Hemodynamics: AO 118/67 mean 89 mm Hg LV 119/27 mm Hg  Coronary angiography: Coronary dominance: right  Left mainstem: Normal  Left anterior descending (LAD): There is a 90% focal stenosis in the mid LAD. The distal LAD at the bifurcation of the second diagonal is subtotally occluded. The distal LAD is very small and diffusely diseased. The first diagonal is diffusely diseased proximally with 90% stenosis at its bifurcation. The second diagonal is diffusely diseased and small in caliber with 90% ostial stenosis.   The ramus intermediate has segmental 80-90% proximal stenosis.   Left circumflex (LCx): There is 30% disease in the proximal and distal vessel.   Right coronary artery (RCA): There is an irregular stenosis in the mid vessel to 50%. There is a 90% stenosis in the distal RCA and proximal PDA. The RV marginal branch has 90% stenosis.  Left ventriculography: N/A  Final Conclusions:   1. Severe 3 vessel  obstructive CAD 2. Elevated LVEDP   Recommendations: Will discuss with rounding team. With his multiple co-morbidities he is a poor candidate for CABG. Targets for PCI include the mid LAD and distal RCA/PDA. This would commit him to DAPT and we need to consider his risk of bleeding.   Peter [place], MDFACC  10/10/2014, 8:10 AM

## 2014-10-10 NOTE — Progress Notes (Signed)
Patient Name: Ralph BuckerRobert A Walker Date of Encounter: 10/10/2014     Principal Problem:   Cardiogenic shock Active Problems:   Cardiac arrest   Anemia due to other cause   Acute respiratory failure with hypoxia   AKI (acute kidney injury)    SUBJECTIVE  Patient has improved over the past couple of days. Cath today showed a tight LAD stenosis and tight distal RCA stenosis.   Also has small diagonals that are tight myoview from  8/5 showed scar in the inferior wall and apex. EF 19%.   Pt has not had any angina.   BP has increased.    CURRENT MEDS . sodium chloride   Intravenous Once  . carvedilol  3.125 mg Oral BID WC  . feeding supplement (ENSURE COMPLETE)  237 mL Oral BID BM  . furosemide  40 mg Oral BID  . insulin aspart  0-20 Units Subcutaneous 6 times per day  . insulin glargine  16 Units Subcutaneous Daily  . pantoprazole  40 mg Oral BID  . sodium chloride  10-40 mL Intracatheter Q12H    OBJECTIVE  Filed Vitals:   10/10/14 0344 10/10/14 0732 10/10/14 0838 10/10/14 0900  BP: 141/66  144/64 153/66  Pulse: 82 116    Temp: 98.3 F (36.8 C)  97.8 F (36.6 C)   TempSrc: Oral  Oral   Resp: 17  18   Height:      Weight: 259 lb 7.7 oz (117.7 kg)     SpO2: 97%  100%     Intake/Output Summary (Last 24 hours) at 10/10/14 1125 Last data filed at 10/10/14 0600  Gross per 24 hour  Intake 821.25 ml  Output   2075 ml  Net -1253.75 ml   Filed Weights   10/08/14 0400 10/09/14 1930 10/10/14 0344  Weight: 257 lb 11.5 oz (116.9 kg) 257 lb 11.5 oz (116.9 kg) 259 lb 7.7 oz (117.7 kg)    PHYSICAL EXAM   HEENT:  Normal  Neck: Supple without bruits or JVD. Lungs:  Resp regular.    Clear anteriorly. Heart: RRR no s3, s4. Grade 2/6 systolic murmur at base. Abdomen:   Mild distention Extremities: No clubbing, cyanosis.  + anasarca .  DP/PT/Radials 2+ and equal bilaterally. Neuro:  Awake and alert    Accessory Clinical Findings  CBC  Recent Labs  10/09/14 0410  10/10/14 0200  WBC 4.7 4.8  NEUTROABS  --  3.7  HGB 8.1* 8.5*  HCT 24.4* 25.2*  MCV 89.4 90.0  PLT 44* 58*   Basic Metabolic Panel  Recent Labs  10/09/14 0410 10/10/14 0230  NA 140 139  K 4.2 4.1  CL 104 101  CO2 28 29  GLUCOSE 171* 145*  BUN 51* 42*  CREATININE 1.37* 1.17  CALCIUM 8.1* 8.3*  MG 2.7* 2.3  PHOS 3.3 4.3   Liver Function Tests  Recent Labs  10/09/14 0410 10/10/14 0230  ALBUMIN 2.3* 2.3*   No results found for this basename: LIPASE, AMYLASE,  in the last 72 hours Cardiac Enzymes No results found for this basename: CKTOTAL, CKMB, CKMBINDEX, TROPONINI,  in the last 72 hours BNP No components found with this basename: POCBNP,  D-Dimer No results found for this basename: DDIMER,  in the last 72 hours Hemoglobin A1C No results found for this basename: HGBA1C,  in the last 72 hours Fasting Lipid Panel  Recent Labs  10/10/14 0430  CHOL 106  HDL 20*  LDLCALC 67  TRIG 96  CHOLHDL  5.3   Thyroid Function Tests No results found for this basename: TSH, T4TOTAL, FREET3, T3FREE, THYROIDAB,  in the last 72 hours  TELE  NSR  ECG on 10/07/14:  Normal sinus rhythm Anterolateral infarct ,  Inf. MI No changes from previous tracing  ASSESSMENT AND PLAN  1. Cardiac arrest  - likely primary event in the setting of nonischemic cardiomyopathy  S/p Artic Sun protocol.   Is awake and alert - ROSC after shocked x 2 by AED  He has a tight stenosis in the distal RCA and mid LAD .  The myoview shows scar in the inferior wall and apex.  He is pain free and it's possible that there is very little viable myocardium distal to these stenosis.    I will have EP see him.  We are not able to do PCI yet as platelet counts are just above 50K and Dr. [place] did not think it was prudent to do PCI and start plavix at this point.  He would like for patient to improve and then consider PCI at that point  I think we should consider LifeVest.  2. H/o severe ischemic  cardiomyopathy  - EF 35% by echo, 19% by stress test.  Consider repeat echo after extubation.  - Stress test noted inferior and apex scar  Needs to have a cath - will schedule for Friday.  Would prefer that he have a platelet count of 50K prior to cath.  Recheck CBC tomorrow  3. CAD - he has a tight stenosis in the distal RCA and mid LAD.  He has an inferior scar and an apical scar.  It's not clear how much viable myocardium he has distal to these stenosis.  I certainly would not consider CABG a good option given the existing scar and possible minimal improvement afterwards.  We could consider PCI but only after his platelet function has improved.   In addition, Plavix is hepatically metabolized and we would need to consider whether or not it would be active in him.    I've talked to Dr. Shirlee Latch and he will plan on the CHF service seeing him in consultation starting Monday.    4. Severe thrombocytopenia, - improving    5. bicuspid aortic valve  6. H/o mild pericardial effusion on recent echo  7. h/o pancreatitis  8. H/o recent sepsis with biliary origin: refused by surgery to cholecystectomy due to perioperative risk with h/o cirrhosis  9. Cirrhosis  10. Obesity  11. DM  11.Junctional rhythm resolved with rewarming.  12. Anemia.  13. Acute renal failure:        Alvia Grove., MD, [facility] 10/10/2014, 11:25 AM [address]  [address] Office [phone] Pager [phone]

## 2014-10-10 NOTE — Interval H&P Note (Signed)
History and Physical Interval Note:  10/10/2014 7:49 AM  [name]  has presented today for surgery, with the diagnosis of cp  The various methods of treatment have been discussed with the patient and family. After consideration of risks, benefits and other options for treatment, the patient has consented to  Procedure(s): LEFT HEART CATHETERIZATION WITH CORONARY ANGIOGRAM (N/A) as a surgical intervention .  The patient's history has been reviewed, patient examined, no change in status, stable for surgery.  I have reviewed the patient's chart and labs.  Questions were answered to the patient's satisfaction.   Cath Lab Visit (complete for each Cath Lab visit)  Clinical Evaluation Leading to the Procedure:   ACS: No.  Non-ACS:    Anginal Classification: No Symptoms  Anti-ischemic medical therapy: Minimal Therapy (1 class of medications)  Non-Invasive Test Results: High-risk stress test findings: cardiac mortality >3%/year  Prior CABG: No previous CABG        Theron Arista [facility] 10/10/2014 7:49 AM

## 2014-10-10 NOTE — Progress Notes (Signed)
Physical Therapy Treatment Patient Details Name: [name] MRN: [id number] DOB: 1961/06/26 Today's Date: 2014-10-27    History of Present Illness Pt adm with cardiac arrest. Underwent artic sun. Pt with resp failure and intubated and acute kidney injury. 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 Not operative candidate. May need life vest. PMH - ischemic cardiomyopathy EF 35-40%, cirrhosis, DM, lt toe amputation, peripheral neuropathy.    PT Comments    Pt making steady progress. Reports another sister will be coming from [place] to assist at DC.  Follow Up Recommendations  Home health PT;Supervision/Assistance - 24 hour     Equipment Recommendations  3in1 (PT)    Recommendations for Other Services OT consult     Precautions / Restrictions Precautions Precautions: Fall    Mobility  Bed Mobility Overal bed mobility: Needs Assistance Bed Mobility: Supine to Sit     Supine to sit: Min assist     General bed mobility comments: Assist to bring trunk up.  Transfers Overall transfer level: Needs assistance Equipment used: Rolling walker (2 wheeled) Transfers: Sit to/from Stand Sit to Stand: Min assist         General transfer comment: verbal cues for hand placement and assist to bring hips up.  Ambulation/Gait Ambulation/Gait assistance: Min guard Ambulation Distance (Feet): 200 Feet Assistive device: Rolling walker (2 wheeled) Gait Pattern/deviations: Step-through pattern;Decreased step length - right;Decreased step length - left;Trunk flexed Gait velocity: decr Gait velocity interpretation: Below normal speed for age/gender General Gait Details: verbal cues to stand more erect   Stairs            Wheelchair Mobility    Modified Rankin (Stroke Patients Only)       Balance   Sitting-balance support: No upper extremity supported;Feet supported Sitting balance-Leahy Scale: Good     Standing balance support: No upper extremity  supported Standing balance-Leahy Scale: Fair                      Cognition Arousal/Alertness: Awake/alert Behavior During Therapy: WFL for tasks assessed/performed Overall Cognitive Status: Within Functional Limits for tasks assessed                      Exercises      General Comments        Pertinent Vitals/Pain Pain Assessment: No/denies pain    Home Living                      Prior Function            PT Goals (current goals can now be found in the care plan section) Progress towards PT goals: Progressing toward goals    Frequency  Min 3X/week    PT Plan Current plan remains appropriate    Co-evaluation             End of Session Equipment Utilized During Treatment: Gait belt;Oxygen Activity Tolerance: Patient tolerated treatment well Patient left: in chair;with call bell/phone within reach;with family/visitor present     Time: 9244-6286 PT Time Calculation (min): 26 min  Charges:  $Gait Training: 23-37 mins                    G Codes:      Sully Dyment 10/27/14, 3:38 PM  [facility] PT [phone]

## 2014-10-10 NOTE — Progress Notes (Signed)
Why TEAM 1 - Stepdown/ICU TEAM Progress Note  [name] NFA:[id number] DOB: 08-30-1961 DOA: 10/02/2014 PCP: [name], MD  Admit HPI / Brief Narrative: 53yo WM PMHx non - ischemic cardiomyopathy EF 35-40% (Not on Hm O2), cirrhosis, DM type 2, cholelithiasis presented 10/15 after arrest at work with EMS trained bystander CPR/defib. Pt suddenly collapsed, fell backward and hit his head. AED advised shockable rhythm. Approx 10 minutes CPR with ROSC before EMS arrival. PCCM called to admit.    HPI/Subjective: Patient seen after cardiac catheterization, tolerated procedure well. He currently has no complaints for me. Ambulating down the hallway with staff members in afternoon.  Assessment/Plan: Cardiac arrest presumably from ischemic cardiomyopathy - Prior to admission at work shockable rhythm per AED, ~10 mins CPR  - EF 35-40% per echo 07/2014, but 19% per nuclear study  -On 10/10/2014 patient undergoing cardiac catheterization which showed severe three-vessel obstructive coronary artery disease. He did not undergo percutaneous intervention given low platelets. Coronary artery bypass grafting a good option given multiple comorbidities. - EP consulted -Continue Lasix 40 mg IV twice a day, carvedilol 3.125 mg twice a day                                         Acute respiratory failure, post arrest  - resolved   Apical left pneumothax noted on CXR 10/15, -resolved  -O2 as needed for O2 saturation > 92%   Pulmonary edema/pleural effusion -Strict in and out -Daily weight -Lasix 60 mg x1  HTN/Cardiogenic shock/Bradycardia, junctional - resolved  -Further EP eval per cardiology   Hyperkalemia -resolved   AKI  - Improving -Creatinine trended down to 1.17 from 1.62 (10/08/2014)   Hx cholelithiasis  -deemed not to be a surgical candidate for cholecystectomy recently.  Hx cirrhosis/?NASH v ETOH  -not felt to be a surgical liver transplant candidate both due to  multiple comorbidites  -Dysphagia diet/carbohydrate modified  -Continue Change PPI daily   Severe thrombocytopenia due to acute illness and cirrhosis (splenomegally?, clumping) > -improving but still not above the 50 K. required by cardiology to perform catheterization 1 unit platelets    Anemia - stable - no clear source of bleeding, labs somewhat worrisome for intravascular hemolysis likely due to cirrhosis  -PRBC for goal hgb > 8gm%   DM type II -Increase Lantus to 16 units daily -Increase SSI to resistant -His blood sugars are controlled, continue Lantus 16 units subcutaneous daily     Code Status: FULL Family Communication: no family present at time of exam Disposition Plan: Per cardiology    Consultants: Dr. Leodis Sias (cardiology) Dr. Casimiro Needle (nephrology)   Procedure/Significant Events: 10/15 hyperkalemia (on aldactone & lisinopril) requiring CRRT  CT head 10/15>>>neg, Small air-fluid level in the left maxillary sinus and right sphenoid sinus 10/03/14 : : Cooled to 33  Bradycardic , on max pressors  Anuric  Sedated , paralysed  On CRRT  10/04/14  On CRRT for high K., on pressors., Just rewarmed: following all commands, received 1 U PRBC  10/18 still on pressors, received 2 U PRBC  10/19 > extubated, off all pressors, following commands  10/20> passed swallow eval, CVVHD off, given lasix made great urine 10/22 CXR Moderate CHF with bilateral pleural effusions and mild pulmonary vascular congestion.     Culture 10/16 blood > neg    Antibiotics: 10/16 unasyn > 10/19  DVT prophylaxis: SCD   Devices    LINES / TUBES:  CVL R IJ CVL 10/15    Continuous Infusions: . sodium chloride 10 mL/hr at 10/09/14 0945    Objective: VITAL SIGNS: Temp: 98 F (36.7 C) (10/23 1215) Temp Source: Oral (10/23 1215) BP: 141/73 mmHg (10/23 1215) Pulse Rate: 91 (10/23 1500) SPO2; FIO2:   Intake/Output Summary (Last 24 hours) at 10/10/14 1531 Last  data filed at 10/10/14 1516  Gross per 24 hour  Intake 1211.25 ml  Output   2675 ml  Net -1463.75 ml     Exam: General: A./O. x4, NAD, increased WOB, No acute respiratory distress Lungs: Dec BS RML/RLL, Left Lung CTA, without wheezes or crackles Cardiovascular: Regular rate and rhythm without murmur gallop or rub normal S1 and S2 Abdomen: Nontender, nondistended, soft, bowel sounds positive, no rebound, no ascites, no appreciable mass Extremities: No significant cyanosis, clubbing, or edema bilateral lower extremities  Data Reviewed: Basic Metabolic Panel:  Recent Labs Lab 10/06/14 0415  10/07/14 0442 10/07/14 0450 10/07/14 1600 10/08/14 0445 10/09/14 0410 10/10/14 0230  NA 139  < >  --  137 137 139 140 139  K 5.0  < >  --  4.7 4.6 4.5 4.2 4.1  CL 105  < >  --  101 101 103 104 101  CO2 27  < >  --  25 25 26 28 29   GLUCOSE 235*  < >  --  147* 160* 159* 171* 145*  BUN 35*  < >  --  37* 45* 52* 51* 42*  CREATININE 1.48*  < >  --  1.31 1.53* 1.62* 1.37* 1.17  CALCIUM 7.2*  < >  --  7.7* 7.8* 8.1* 8.1* 8.3*  MG 2.3  --  2.5  --   --  2.5 2.7* 2.3  PHOS 2.2*  < >  --  2.8 3.0 3.4 3.3 4.3  < > = values in this interval not displayed. Liver Function Tests:  Recent Labs Lab 10/07/14 0442 10/07/14 0450 10/07/14 1600 10/08/14 0445 10/09/14 0410 10/10/14 0230  AST 67*  --   --   --   --   --   ALT 44  --   --   --   --   --   ALKPHOS 112  --   --   --   --   --   BILITOT 1.4*  --   --   --   --   --   PROT 4.8*  --   --   --   --   --   ALBUMIN 2.2* 2.2* 2.3* 2.4* 2.3* 2.3*   No results found for this basename: LIPASE, AMYLASE,  in the last 168 hours No results found for this basename: AMMONIA,  in the last 168 hours CBC:  Recent Labs Lab 10/04/14 1700  10/06/14 1500 10/07/14 0442 10/07/14 0930 10/08/14 0445 10/09/14 0410 10/10/14 0200  WBC 5.5  < > 3.4* 3.4*  --  4.6 4.7 4.8  NEUTROABS 4.5  --   --  2.5  --   --   --  3.7  HGB 7.7*  < > 8.2* 8.1*  --  8.3*  8.1* 8.5*  HCT 22.6*  < > 24.2* 23.8*  --  24.7* 24.4* 25.2*  MCV 88.3  < > 88.6 89.1  --  90.1 89.4 90.0  PLT 26*  < > 17*  18* 19* 20* 34* 44* 58*  < > = values  in this interval not displayed. Cardiac Enzymes: No results found for this basename: CKTOTAL, CKMB, CKMBINDEX, TROPONINI,  in the last 168 hours BNP (last 3 results) No results found for this basename: PROBNP,  in the last 8760 hours CBG:  Recent Labs Lab 10/10/14 0016 10/10/14 0348 10/10/14 0607 10/10/14 0836 10/10/14 1220  GLUCAP 183* 102* 113* 110* 141*    Recent Results (from the past 240 hour(s))  MRSA PCR SCREENING     Status: None   Collection Time    10/02/14  4:46 PM      Result Value Ref Range Status   MRSA by PCR NEGATIVE  NEGATIVE Final   Comment:            The GeneXpert MRSA Assay (FDA     approved for NASAL specimens     only), is one component of a     comprehensive MRSA colonization     surveillance program. It is not     intended to diagnose MRSA     infection nor to guide or     monitor treatment for     MRSA infections.  CULTURE, BLOOD (ROUTINE X 2)     Status: None   Collection Time    10/03/14 11:15 AM      Result Value Ref Range Status   Specimen Description BLOOD LEFT ARM   Final   Special Requests BOTTLES DRAWN AEROBIC ONLY 5 CC   Final   Culture  Setup Time     Final   Value: 10/03/2014 17:15     Performed at [facility]   Culture     Final   Value: NO GROWTH 5 DAYS     Performed at [facility]   Report Status 10/09/2014 FINAL   Final  CULTURE, BLOOD (ROUTINE X 2)     Status: None   Collection Time    10/03/14 11:25 AM      Result Value Ref Range Status   Specimen Description BLOOD LEFT HAND   Final   Special Requests BOTTLES DRAWN AEROBIC ONLY 2 CC   Final   Culture  Setup Time     Final   Value: 10/03/2014 17:13     Performed at [facility]   Culture     Final   Value: NO GROWTH 5 DAYS     Performed at [facility]   Report Status  10/09/2014 FINAL   Final     Studies:  Recent x-ray studies have been reviewed in detail by the Attending Physician  Scheduled Meds:  Scheduled Meds: . sodium chloride   Intravenous Once  . carvedilol  3.125 mg Oral BID WC  . feeding supplement (ENSURE COMPLETE)  237 mL Oral BID BM  . furosemide  40 mg Oral BID  . insulin aspart  0-20 Units Subcutaneous 6 times per day  . insulin glargine  16 Units Subcutaneous Daily  . pantoprazole  40 mg Oral BID  . sodium chloride  10-40 mL Intracatheter Q12H    Time spent on care of this patient: 35 mins   Jeralyn Bennett , MD   [facility] Office  [phone] Pager - [phone]  On-Call/Text Page:      [email or web address]      password [email or web address]  If 7PM-7AM, please contact night-coverage [email or web address] Password [email or web address] 10/10/2014, 3:31 PM   LOS: 8 days

## 2014-10-11 DIAGNOSIS — K7030 Alcoholic cirrhosis of liver without ascites: Secondary | ICD-10-CM

## 2014-10-11 LAB — CBC WITH DIFFERENTIAL/PLATELET
BASOS PCT: 1 % (ref 0–1)
Basophils Absolute: 0 10*3/uL (ref 0.0–0.1)
Eosinophils Absolute: 0.2 10*3/uL (ref 0.0–0.7)
Eosinophils Relative: 4 % (ref 0–5)
HEMATOCRIT: 25.6 % — AB (ref 39.0–52.0)
Hemoglobin: 8.4 g/dL — ABNORMAL LOW (ref 13.0–17.0)
Lymphocytes Relative: 10 % — ABNORMAL LOW (ref 12–46)
Lymphs Abs: 0.4 10*3/uL — ABNORMAL LOW (ref 0.7–4.0)
MCH: 29.8 pg (ref 26.0–34.0)
MCHC: 32.8 g/dL (ref 30.0–36.0)
MCV: 90.8 fL (ref 78.0–100.0)
MONO ABS: 0.5 10*3/uL (ref 0.1–1.0)
Monocytes Relative: 12 % (ref 3–12)
NEUTROS PCT: 74 % (ref 43–77)
Neutro Abs: 3.1 10*3/uL (ref 1.7–7.7)
PLATELETS: 58 10*3/uL — AB (ref 150–400)
RBC: 2.82 MIL/uL — ABNORMAL LOW (ref 4.22–5.81)
RDW: 17.3 % — AB (ref 11.5–15.5)
WBC: 4.1 10*3/uL (ref 4.0–10.5)

## 2014-10-11 LAB — RENAL FUNCTION PANEL
Albumin: 2.3 g/dL — ABNORMAL LOW (ref 3.5–5.2)
Anion gap: 7 (ref 5–15)
BUN: 33 mg/dL — AB (ref 6–23)
CO2: 31 meq/L (ref 19–32)
CREATININE: 1.09 mg/dL (ref 0.50–1.35)
Calcium: 8.3 mg/dL — ABNORMAL LOW (ref 8.4–10.5)
Chloride: 101 mEq/L (ref 96–112)
GFR calc Af Amer: 88 mL/min — ABNORMAL LOW (ref >90)
GFR, EST NON AFRICAN AMERICAN: 76 mL/min — AB (ref >90)
Glucose, Bld: 92 mg/dL (ref 70–99)
POTASSIUM: 3.8 meq/L (ref 3.7–5.3)
Phosphorus: 4.8 mg/dL — ABNORMAL HIGH (ref 2.3–4.6)
Sodium: 139 mEq/L (ref 137–147)

## 2014-10-11 LAB — GLUCOSE, CAPILLARY
GLUCOSE-CAPILLARY: 227 mg/dL — AB (ref 70–99)
GLUCOSE-CAPILLARY: 105 mg/dL — AB (ref 70–99)
Glucose-Capillary: 89 mg/dL (ref 70–99)
Glucose-Capillary: 105 mg/dL — ABNORMAL HIGH (ref 70–99)
Glucose-Capillary: 97 mg/dL (ref 70–99)
Glucose-Capillary: 233 mg/dL — ABNORMAL HIGH (ref 70–99)
Glucose-Capillary: 126 mg/dL — ABNORMAL HIGH (ref 70–99)

## 2014-10-11 LAB — MAGNESIUM: Magnesium: 2 mg/dL (ref 1.5–2.5)

## 2014-10-11 MED ORDER — TRAMADOL HCL 50 MG PO TABS
50.0000 mg | ORAL_TABLET | Freq: Four times a day (QID) | ORAL | Status: DC | PRN
  Administered 2014-10-11 – 2014-10-17 (×3): 50 mg via ORAL
  Filled 2014-10-11 (×3): qty 1

## 2014-10-11 MED ORDER — LISINOPRIL 5 MG PO TABS
5.0000 mg | ORAL_TABLET | Freq: Every day | ORAL | Status: DC
  Administered 2014-10-11 – 2014-10-15 (×5): 5 mg via ORAL
  Filled 2014-10-11 (×7): qty 1

## 2014-10-11 MED ORDER — ENOXAPARIN SODIUM 40 MG/0.4ML ~~LOC~~ SOLN: 40.0000 mg | SUBCUTANEOUS | Status: DC

## 2014-10-11 MED ORDER — HEPARIN SODIUM (PORCINE) 5000 UNIT/ML IJ SOLN
5000.0000 [IU] | Freq: Three times a day (TID) | INTRAMUSCULAR | Status: DC
  Administered 2014-10-11 (×2): 5000 [IU] via SUBCUTANEOUS
  Filled 2014-10-11 (×6): qty 1

## 2014-10-11 NOTE — Progress Notes (Addendum)
SUBJECTIVE: The patient is doing well today.  At this time, he has residual chest wall pain from CPR but denies angina, shortness of breath or any new concerns.  CURRENT MEDICATIONS: . sodium chloride   Intravenous Once  . carvedilol  3.125 mg Oral BID WC  . feeding supplement (ENSURE COMPLETE)  237 mL Oral BID BM  . furosemide  40 mg Oral BID  . heparin subcutaneous  5,000 Units Subcutaneous 3 times per day  . insulin aspart  0-20 Units Subcutaneous 6 times per day  . insulin glargine  16 Units Subcutaneous Daily  . pantoprazole  40 mg Oral BID  . sodium chloride  10-40 mL Intracatheter Q12H   . sodium chloride 10 mL/hr at 10/09/14 0945    OBJECTIVE: Physical Exam: Filed Vitals:   10/11/14 0400 10/11/14 0500 10/11/14 0835 10/11/14 0852  BP: 147/67  130/78 134/64  Pulse: 76  86 83  Temp:   98.2 F (36.8 C)   TempSrc:   Oral   Resp:   18   Height:      Weight:  260 lb 5.8 oz (118.1 kg)    SpO2: 100%  98% 93%    Intake/Output Summary (Last 24 hours) at 10/11/14 0925 Last data filed at 10/11/14 0900  Gross per 24 hour  Intake   1320 ml  Output   2150 ml  Net   -830 ml    Telemetry reveals sinus rhythm  GEN- The patient is ill appearing, alert and oriented x 3 today.   Head- normocephalic, atraumatic, + alopecia Eyes-  Sclera clear, conjunctiva pink Ears- hearing intact Oropharynx- clear Neck- supple,CVL in place Lungs- Clear to ausculation bilaterally, normal work of breathing Heart- Regular rate and rhythm  GI- soft, NT, ND, + BS Extremities- no clubbing, cyanosis, + dependant edema, SCDs are in place Skin- no rash or lesion Psych- euthymic mood, full affect Neuro- strength and sensation are intact  LABS: Basic Metabolic Panel:  Recent Labs  44/62/86 0230 10/11/14 0430 10/11/14 0500  NA 139 139  --   K 4.1 3.8  --   CL 101 101  --   CO2 29 31  --   GLUCOSE 145* 92  --   BUN 42* 33*  --   CREATININE 1.17 1.09  --   CALCIUM 8.3* 8.3*  --   MG  2.3  --  2.0  PHOS 4.3 4.8*  --    Liver Function Tests:  Recent Labs  10/10/14 0230 10/11/14 0430  ALBUMIN 2.3* 2.3*   CBC:  Recent Labs  10/10/14 0200 10/11/14 0500  WBC 4.8 4.1  NEUTROABS 3.7 3.1  HGB 8.5* 8.4*  HCT 25.2* 25.6*  MCV 90.0 90.8  PLT 58* 58*    Hemoglobin A1C:  Recent Labs  10/10/14 0230  HGBA1C 5.8*   Fasting Lipid Panel:  Recent Labs  10/10/14 0430  CHOL 106  HDL 20*  LDLCALC 67  TRIG 96  CHOLHDL 5.3    RADIOLOGY: Ct Head Wo Contrast 10/02/2014   CLINICAL DATA:  53 year old male with history of trauma after after a fall during a syncopal episode during which the patient fell backward and struck his head. Status post CPR.  EXAM: CT HEAD WITHOUT CONTRAST  TECHNIQUE: Contiguous axial images were obtained from the base of the skull through the vertex without intravenous contrast.  COMPARISON:  No priors.  FINDINGS: No acute displaced skull fractures are identified. No acute intracranial abnormality. Specifically, no evidence of  acute post-traumatic intracranial hemorrhage, no definite regions of acute/subacute cerebral ischemia, no focal mass, mass effect, hydrocephalus or abnormal intra or extra-axial fluid collections. The visualized paranasal sinuses and mastoids are well pneumatized, with exception of a small air-fluid level in the posterior aspect of the left maxillary sinus and right sphenoid sinus.  IMPRESSION: 1. Small amount of soft tissue swelling in the occipital scalp, without underlying displaced skull fracture or acute intracranial abnormalities. 2. Small air-fluid level in the left maxillary sinus and right sphenoid sinus. This is nonspecific, but correlation for signs of sinusitis is recommended.   Electronically Signed   By: Trudie Reedaniel  Entrikin M.D.   On: 10/02/2014 16:36   Dg Chest Port 1 View 10/09/2014   CLINICAL DATA:  History of cardiac arrest last week ; persistent chest soreness and shortness of breath ; history of CHF and diabetes   EXAM: PORTABLE CHEST - 1 VIEW  COMPARISON:  Portable chest x-ray of October 05, 2014  FINDINGS: The lung volumes remain low. The left hemidiaphragm remains obscured. There is partial obscuration of the right hemidiaphragm today. The cardiopericardial silhouette remains enlarged. The pulmonary vascularity is mildly engorged but has improved somewhat since the previous study. The trachea and esophagus have been extubated. A right internal jugular venous catheter tip projects over the midportion of the SVC.  IMPRESSION: Moderate CHF with bilateral pleural effusions and mild pulmonary vascular congestion. The findings are accentuated by hypoinflation. When the patient can tolerate the procedure, a PA and lateral chest x-ray with deep inspiration would be useful.   Electronically Signed   By: David  Swazil[place]   On: 10/09/2014 15:06   ASSESSMENT AND PLAN:  Principal Problem:   Cardiogenic shock Active Problems:   Cardiac arrest   Anemia due to other cause   Acute respiratory failure with hypoxia   AKI (acute kidney injury)   1. Cardiac arrest 2/2 presumed VF (strips not available) S/p arctic sun  Neuro status intact  2. CAD Not felt to be candidate for CABG 2/2 comorbidities PCI being considered in the future if thrombocytopenia improves  3.  Ischemic cardiomyopathy Diagnosed 07/2014 Continue carvedilol Was on lisinopril at home.  Will restart at this time. Continue to optimize coreg and lisinopril as BP allow.  4.  Thrombocytopenia Presumed 2/2 cirrhosis   5.  Recent sepsis 07/2014  The patient is s/p resuscitation from cardiac arrest.  He has nonrevascularized CAD and an ischemic CM in the setting of multiple co morbidities including pancytopenia of unclear cause and cirrhosis.  He has a complex medical condition requiring very high level of decision making.  He is at very high risk of sudden death/ decompensation.   I have reviewed Dr Bruna Potteraylors note and agree that the patient is not a candidate  for ICD therapy given his poor prognosis.  Ideally, he would be revascularized at some point.  I will defer this to our interventional team. I would not favor lifevest for this patient unless he has plans for revascularization in the near future and lifevest was planned as a bridge to this procedure.  Otherwise, placing a lifevest without a planned end point would be less than ideal.  OK to transfer to telemetry from my standpoint. General cardiology to follow.  I had a long discussion with the patients sister and the patient.  Given his poor prognosis, they would be interested in palliative care consultation to discuss goals of care.

## 2014-10-11 NOTE — Progress Notes (Signed)
[place] TEAM 1 - Stepdown/ICU TEAM Progress Note  [name] ZOX:[id number] DOB: 1961/05/30 DOA: 10/02/2014 PCP: [name], MD  Admit HPI / Brief Narrative: 53yo WM PMHx non - ischemic cardiomyopathy EF 35-40% (Not on Hm O2), cirrhosis, DM type 2, cholelithiasis presented 10/15 after arrest at work with EMS trained bystander CPR/defib. Pt suddenly collapsed, fell backward and hit his head. AED advised shockable rhythm. Approx 10 minutes CPR with ROSC before EMS arrival. Patient was admitted to [facility] of service. He underwent hypothermic protocol, and placed on pressors. Patient was also placed on CRRT given high potassium. On 10/06/2014 he was extubated. Patient was evaluated by nephrology during this hospitalization. Acute kidney injury in setting of cardiac arrest resolving as he made good progress.  By 10/07/2014 CVVHD was discontinued as he was given Lasix with reduction of adequate amount of urine. On 10/10/2014 he underwent cardiac catheterization which showed severe three-vessel obstructive coronary artery disease, did not undergo percutaneous intervention. We'll stop Levaquin artery bypass grafting was not a good option secondary to comorbidities. Case discussed with cardiology, question of PCI will be reconsidered in the future if the thrombocytopenia resolves. Transfer orders to telemetry were written on 10/11/2014.   HPI/Subjective: Patient reports doing well, he and related with physical therapy yesterday. He complains of ongoing chest soreness from CPR. Otherwise tolerating by mouth intake  Assessment/Plan: Cardiac arrest presumably from ischemic cardiomyopathy - Prior to admission at work shockable rhythm per AED, ~10 mins CPR  - EF 35-40% per echo 07/2014, but 19% per nuclear study  -On 10/10/2014 patient undergoing cardiac catheterization which showed severe three-vessel obstructive coronary artery disease. He did not undergo percutaneous intervention given low platelets.  Coronary artery bypass grafting a good option given multiple comorbidities. - EP consulted -Continue Lasix 40 mg by mouth twice a day, carvedilol 3.125 mg twice a day       - Possibility of PCI would be readdressed in the future if thrombocytopenia improves                                   Acute respiratory failure, post arrest  - resolved   Apical left pneumothax noted on CXR 10/15, -resolved  -O2 as needed for O2 saturation > 92%   Pulmonary edema/pleural effusion -Strict in and out -Daily weight -Day Lasix 40 mg by mouth twice a day   HTN/Cardiogenic shock/Bradycardia, junctional - resolved  -Further EP eval per cardiology   Hyperkalemia -resolved   AKI  -Likely secondary to cardiac arrest - Improving -Creatinine trended down to 1.09 on 10/11/2014  Hx cholelithiasis  -deemed not to be a surgical candidate for cholecystectomy recently.  Hx cirrhosis/?NASH v ETOH  -not felt to be a surgical liver transplant candidate both due to multiple comorbidites  -Dysphagia diet/carbohydrate modified  -Continue Change PPI daily   Severe thrombocytopenia due to acute illness and cirrhosis (splenomegally?, clumping) > -improving but still not above the 50 K. required by cardiology to perform catheterization 1 unit platelets    Anemia - stable - no clear source of bleeding, labs somewhat worrisome for intravascular hemolysis likely due to cirrhosis  -PRBC for goal hgb > 8gm%   DM type II -Increase Lantus to 16 units daily -Increase SSI to resistant -His blood sugars are controlled, continue Lantus 16 units subcutaneous daily     Code Status: FULL Family Communication: no family present at time of exam Disposition Plan:  Per cardiology    Consultants: Dr. Leodis SiasPhillip Nahser (cardiology) Dr. Casimiro NeedleAlvin Powell (nephrology)   Procedure/Significant Events: 10/15 hyperkalemia (on aldactone & lisinopril) requiring CRRT  CT head 10/15>>>neg, Small air-fluid level in the left  maxillary sinus and right sphenoid sinus 10/03/14 : : Cooled to 33  Bradycardic , on max pressors  Anuric  Sedated , paralysed  On CRRT  10/04/14  On CRRT for high K., on pressors., Just rewarmed: following all commands, received 1 U PRBC  10/18 still on pressors, received 2 U PRBC  10/19 > extubated, off all pressors, following commands  10/20> passed swallow eval, CVVHD off, given lasix made great urine 10/22 CXR Moderate CHF with bilateral pleural effusions and mild pulmonary vascular congestion.     Culture 10/16 blood > neg    Antibiotics: 10/16 unasyn > 10/19    DVT prophylaxis: SCD   Devices    LINES / TUBES:  CVL R IJ CVL 10/15    Continuous Infusions: . sodium chloride 10 mL/hr at 10/09/14 0945    Objective: VITAL SIGNS: Temp: 98.8 F (37.1 C) (10/24 1217) Temp Source: Oral (10/24 1217) BP: 126/60 mmHg (10/24 1217) Pulse Rate: 85 (10/24 1217) SPO2; FIO2:   Intake/Output Summary (Last 24 hours) at 10/11/14 1418 Last data filed at 10/11/14 0900  Gross per 24 hour  Intake   1080 ml  Output   1800 ml  Net   -720 ml     Exam: General: A./O. x4, NAD, increased WOB, No acute respiratory distress Lungs: Dec BS RML/RLL, Left Lung CTA, without wheezes or crackles Cardiovascular: Regular rate and rhythm without murmur gallop or rub normal S1 and S2 Abdomen: Nontender, nondistended, soft, bowel sounds positive, no rebound, no ascites, no appreciable mass Extremities: No significant cyanosis, clubbing, or edema bilateral lower extremities  Data Reviewed: Basic Metabolic Panel:  Recent Labs Lab 10/07/14 0442  10/07/14 1600 10/08/14 0445 10/09/14 0410 10/10/14 0230 10/11/14 0430 10/11/14 0500  NA  --   < > 137 139 140 139 139  --   K  --   < > 4.6 4.5 4.2 4.1 3.8  --   CL  --   < > 101 103 104 101 101  --   CO2  --   < > 25 26 28 29 31   --   GLUCOSE  --   < > 160* 159* 171* 145* 92  --   BUN  --   < > 45* 52* 51* 42* 33*  --   CREATININE   --   < > 1.53* 1.62* 1.37* 1.17 1.09  --   CALCIUM  --   < > 7.8* 8.1* 8.1* 8.3* 8.3*  --   MG 2.5  --   --  2.5 2.7* 2.3  --  2.0  PHOS  --   < > 3.0 3.4 3.3 4.3 4.8*  --   < > = values in this interval not displayed. Liver Function Tests:  Recent Labs Lab 10/07/14 0442  10/07/14 1600 10/08/14 0445 10/09/14 0410 10/10/14 0230 10/11/14 0430  AST 67*  --   --   --   --   --   --   ALT 44  --   --   --   --   --   --   ALKPHOS 112  --   --   --   --   --   --   BILITOT 1.4*  --   --   --   --   --   --  PROT 4.8*  --   --   --   --   --   --   ALBUMIN 2.2*  < > 2.3* 2.4* 2.3* 2.3* 2.3*  < > = values in this interval not displayed. No results found for this basename: LIPASE, AMYLASE,  in the last 168 hours No results found for this basename: AMMONIA,  in the last 168 hours CBC:  Recent Labs Lab 10/04/14 1700  10/07/14 0442 10/07/14 0930 10/08/14 0445 10/09/14 0410 10/10/14 0200 10/11/14 0500  WBC 5.5  < > 3.4*  --  4.6 4.7 4.8 4.1  NEUTROABS 4.5  --  2.5  --   --   --  3.7 3.1  HGB 7.7*  < > 8.1*  --  8.3* 8.1* 8.5* 8.4*  HCT 22.6*  < > 23.8*  --  24.7* 24.4* 25.2* 25.6*  MCV 88.3  < > 89.1  --  90.1 89.4 90.0 90.8  PLT 26*  < > 19* 20* 34* 44* 58* 58*  < > = values in this interval not displayed. Cardiac Enzymes: No results found for this basename: CKTOTAL, CKMB, CKMBINDEX, TROPONINI,  in the last 168 hours BNP (last 3 results) No results found for this basename: PROBNP,  in the last 8760 hours CBG:  Recent Labs Lab 10/10/14 1636 10/10/14 2010 10/11/14 0024 10/11/14 0348 10/11/14 0833  GLUCAP 220* 154* 89 105* 97    Recent Results (from the past 240 hour(s))  MRSA PCR SCREENING     Status: None   Collection Time    10/02/14  4:46 PM      Result Value Ref Range Status   MRSA by PCR NEGATIVE  NEGATIVE Final   Comment:            The GeneXpert MRSA Assay (FDA     approved for NASAL specimens     only), is one component of a     comprehensive MRSA  colonization     surveillance program. It is not     intended to diagnose MRSA     infection nor to guide or     monitor treatment for     MRSA infections.  CULTURE, BLOOD (ROUTINE X 2)     Status: None   Collection Time    10/03/14 11:15 AM      Result Value Ref Range Status   Specimen Description BLOOD LEFT ARM   Final   Special Requests BOTTLES DRAWN AEROBIC ONLY 5 CC   Final   Culture  Setup Time     Final   Value: 10/03/2014 17:15     Performed at [facility]   Culture     Final   Value: NO GROWTH 5 DAYS     Performed at [facility]   Report Status 10/09/2014 FINAL   Final  CULTURE, BLOOD (ROUTINE X 2)     Status: None   Collection Time    10/03/14 11:25 AM      Result Value Ref Range Status   Specimen Description BLOOD LEFT HAND   Final   Special Requests BOTTLES DRAWN AEROBIC ONLY 2 CC   Final   Culture  Setup Time     Final   Value: 10/03/2014 17:13     Performed at [facility]   Culture     Final   Value: NO GROWTH 5 DAYS     Performed at [facility]   Report Status 10/09/2014 FINAL  Final     Studies:  Recent x-ray studies have been reviewed in detail by the Attending Physician  Scheduled Meds:  Scheduled Meds: . sodium chloride   Intravenous Once  . carvedilol  3.125 mg Oral BID WC  . feeding supplement (ENSURE COMPLETE)  237 mL Oral BID BM  . furosemide  40 mg Oral BID  . heparin subcutaneous  5,000 Units Subcutaneous 3 times per day  . insulin aspart  0-20 Units Subcutaneous 6 times per day  . insulin glargine  16 Units Subcutaneous Daily  . lisinopril  5 mg Oral Daily  . pantoprazole  40 mg Oral BID  . sodium chloride  10-40 mL Intracatheter Q12H    Time spent on care of this patient: 35 mins   Jeralyn Bennett , MD   [facility] Office  [phone] Pager - [phone]  On-Call/Text Page:      [email or web address]      password [email or web address]  If 7PM-7AM, please contact night-coverage [email or web address] Password  [email or web address] 10/11/2014, 2:18 PM   LOS: 9 days

## 2014-10-12 LAB — GLUCOSE, CAPILLARY
GLUCOSE-CAPILLARY: 73 mg/dL (ref 70–99)
Glucose-Capillary: 74 mg/dL (ref 70–99)
Glucose-Capillary: 111 mg/dL — ABNORMAL HIGH (ref 70–99)
Glucose-Capillary: 230 mg/dL — ABNORMAL HIGH (ref 70–99)
Glucose-Capillary: 134 mg/dL — ABNORMAL HIGH (ref 70–99)
Glucose-Capillary: 173 mg/dL — ABNORMAL HIGH (ref 70–99)

## 2014-10-12 LAB — CBC WITH DIFFERENTIAL/PLATELET
Basophils Absolute: 0 10*3/uL (ref 0.0–0.1)
Basophils Relative: 1 % (ref 0–1)
Eosinophils Absolute: 0.1 10*3/uL (ref 0.0–0.7)
Eosinophils Relative: 3 % (ref 0–5)
HCT: 28.3 % — ABNORMAL LOW (ref 39.0–52.0)
HEMOGLOBIN: 9.2 g/dL — AB (ref 13.0–17.0)
LYMPHS ABS: 0.5 10*3/uL — AB (ref 0.7–4.0)
Lymphocytes Relative: 12 % (ref 12–46)
MCH: 29.3 pg (ref 26.0–34.0)
MCHC: 32.5 g/dL (ref 30.0–36.0)
MCV: 90.1 fL (ref 78.0–100.0)
MONOS PCT: 8 % (ref 3–12)
Monocytes Absolute: 0.3 10*3/uL (ref 0.1–1.0)
NEUTROS ABS: 3 10*3/uL (ref 1.7–7.7)
NEUTROS PCT: 76 % (ref 43–77)
PLATELETS: 58 10*3/uL — AB (ref 150–400)
RBC: 3.14 MIL/uL — AB (ref 4.22–5.81)
RDW: 17.4 % — ABNORMAL HIGH (ref 11.5–15.5)
WBC: 3.9 10*3/uL — AB (ref 4.0–10.5)

## 2014-10-12 LAB — MAGNESIUM: MAGNESIUM: 1.8 mg/dL (ref 1.5–2.5)

## 2014-10-12 NOTE — Progress Notes (Signed)
Progress Note  Ralph BuckerRobert A Walker VWU:[id number] DOB: 10/01/1961 DOA: 10/02/2014 PCP: Jeoffrey MassedMCGOWEN,PHILIP H, MD  Brief Narrative: 53yo WM PMHx non - ischemic cardiomyopathy EF 35-40% (Not on Hm O2), cirrhosis, DM type 2, cholelithiasis presented 10/15 after arrest at work with EMS trained bystander CPR/defib. Pt suddenly collapsed, fell backward and hit his head. AED advised shockable rhythm. Approx 10 minutes CPR with ROSC before EMS arrival. [facility] He underwent hypothermic protocol, and placed on pressors. Patient was also placed on CRRT due to high potassium. On 10/06/2014 he was extubated. Patient was evaluated by nephrology during this hospitalization. His Acute kidney injury in setting of cardiac arrest has resolved.  By 10/07/2014 CVVH was discontinued. He was given Lasix. On 10/10/2014 he underwent cardiac catheterization which showed severe three-vessel obstructive coronary artery disease, did not undergo percutaneous intervention. He was seen by cardiothoracic surgery but CABG was not considered a good option secondary to comorbidities. Case discussed with cardiology and question of PCI will be reconsidered in the future if the thrombocytopenia resolves.   Subjective: Patient feels well. Denies any shortness of breath. Still with some pain over chest from when he underwent CPR.   Assessment/Plan: Cardiac arrest presumably from ischemic cardiomyopathy - EF 35-40% per echo 07/2014, but 19% per nuclear study  -On 10/10/2014 patient undergoing cardiac catheterization which showed severe three-vessel obstructive coronary artery disease. He did not undergo percutaneous intervention given low platelets. Coronary artery bypass grafting not a good option given multiple comorbidities. - EP consulted was consulted and not a ICD candidate either. Nor a life vest candidate. Palliative medicine input has been sought. -Continue Lasix 40 mg by mouth twice a day, carvedilol 3.125 mg  twice a day       - Possibility of PCI would be readdressed in the future if thrombocytopenia improves                                   Acute respiratory failure, post arrest  - resolved   Apical left pneumothax noted on CXR 10/15, -resolved  -O2 as needed for O2 saturation > 92%   Pulmonary edema/pleural effusion/Pedal Edema -Strict in and out -Daily weight -Day Lasix 40 mg by mouth twice a day   Bradycardia, junctional - resolved with rewarming  Hyperkalemia -resolved   AKI  -Likely secondary to cardiac arrest - Improved  Hx cholelithiasis  -deemed not to be a surgical candidate for cholecystectomy recently.  Hx cirrhosis/?NASH v ETOH  -not felt to be a surgical liver transplant candidate both due to multiple comorbidites  -Dysphagia diet/carbohydrate modified  -Continue Change PPI daily   Severe thrombocytopenia due to acute illness and cirrhosis (splenomegally?, clumping)  -stable.   Anemia - stable - no clear source of bleeding  - PRBC for goal hgb > 8gm%   DM type II -Continue Lantus and SSI  DVT Prophylaxis: SCD's Code Status: FULL Family Communication: Discussed with patient Disposition Plan: Per cardiology   Consultants: Cardiology, EP, Nephrology, PMT   Procedure/Significant Events: 10/15 hyperkalemia (on aldactone & lisinopril) requiring CRRT  CT head 10/15>>>neg, Small air-fluid level in the left maxillary sinus and right sphenoid sinus  10/03/14 : : Cooled to 33  Bradycardic , on max pressors  Anuric  Sedated , paralysed  On CRRT   10/04/14  On CRRT for high K., on pressors., Just rewarmed: following all commands, received 1 U PRBC   10/18  still on pressors, received 2 U PRBC  10/19 > extubated, off all pressors, following commands  10/20> passed swallow eval, CVVHD off, given lasix made great urine 10/22 CXR Moderate CHF with bilateral pleural effusions and mild pulmonary vascular congestion.   Culture 10/16 blood > neg    Antibiotics: 10/16 unasyn > 10/19   LINES / TUBES:  CVL R IJ CVL 10/15   Objective: VITAL SIGNS:  BP 125/60  Pulse 76  Temp(Src) 97.7 F (36.5 C) (Oral)  Resp 18  Ht 5\' 10"  (1.778 m)  Wt 118 kg (260 lb 2.3 oz)  BMI 37.33 kg/m2  SpO2 90%   Exam: General: A./O x4, NAD,  Lungs: Improved air entry bilaterally. Few crackles at bases. No wheezing.  Cardiovascular: Regular rate and rhythm without murmur gallop or rub normal S1 and S2 Abdomen: Nontender, nondistended, soft, bowel sounds positive, no rebound, no ascites, no appreciable mass Extremities: 2+ pitting edema bilateral LE  Data Reviewed: Basic Metabolic Panel:  Recent Labs Lab 10/07/14 1600 10/08/14 0445 10/09/14 0410 10/10/14 0230 10/11/14 0430 10/11/14 0500 10/12/14 0812  NA 137 139 140 139 139  --   --   K 4.6 4.5 4.2 4.1 3.8  --   --   CL 101 103 104 101 101  --   --   CO2 25 26 28 29 31   --   --   GLUCOSE 160* 159* 171* 145* 92  --   --   BUN 45* 52* 51* 42* 33*  --   --   CREATININE 1.53* 1.62* 1.37* 1.17 1.09  --   --   CALCIUM 7.8* 8.1* 8.1* 8.3* 8.3*  --   --   MG  --  2.5 2.7* 2.3  --  2.0 1.8  PHOS 3.0 3.4 3.3 4.3 4.8*  --   --    Liver Function Tests:  Recent Labs Lab 10/07/14 0442  10/07/14 1600 10/08/14 0445 10/09/14 0410 10/10/14 0230 10/11/14 0430  AST 67*  --   --   --   --   --   --   ALT 44  --   --   --   --   --   --   ALKPHOS 112  --   --   --   --   --   --   BILITOT 1.4*  --   --   --   --   --   --   PROT 4.8*  --   --   --   --   --   --   ALBUMIN 2.2*  < > 2.3* 2.4* 2.3* 2.3* 2.3*  < > = values in this interval not displayed.  CBC:  Recent Labs Lab 10/07/14 0442  10/08/14 0445 10/09/14 0410 10/10/14 0200 10/11/14 0500 10/12/14 0812  WBC 3.4*  --  4.6 4.7 4.8 4.1 3.9*  NEUTROABS 2.5  --   --   --  3.7 3.1 3.0  HGB 8.1*  --  8.3* 8.1* 8.5* 8.4* 9.2*  HCT 23.8*  --  24.7* 24.4* 25.2* 25.6* 28.3*  MCV 89.1  --  90.1 89.4 90.0 90.8 90.1  PLT 19*  < > 34*  44* 58* 58* 58*  < > = values in this interval not displayed.  CBG:  Recent Labs Lab 10/11/14 1625 10/11/14 2043 10/11/14 2209 10/12/14 0014 10/12/14 0410  GLUCAP 233* 126* 105* 73 74    Recent Results (from the past 240 hour(s))  MRSA  PCR SCREENING     Status: None   Collection Time    10/02/14  4:46 PM      Result Value Ref Range Status   MRSA by PCR NEGATIVE  NEGATIVE Final   Comment:            The GeneXpert MRSA Assay (FDA     approved for NASAL specimens     only), is one component of a     comprehensive MRSA colonization     surveillance program. It is not     intended to diagnose MRSA     infection nor to guide or     monitor treatment for     MRSA infections.  CULTURE, BLOOD (ROUTINE X 2)     Status: None   Collection Time    10/03/14 11:15 AM      Result Value Ref Range Status   Specimen Description BLOOD LEFT ARM   Final   Special Requests BOTTLES DRAWN AEROBIC ONLY 5 CC   Final   Culture  Setup Time     Final   Value: 10/03/2014 17:15     Performed at [facility]   Culture     Final   Value: NO GROWTH 5 DAYS     Performed at [facility]   Report Status 10/09/2014 FINAL   Final  CULTURE, BLOOD (ROUTINE X 2)     Status: None   Collection Time    10/03/14 11:25 AM      Result Value Ref Range Status   Specimen Description BLOOD LEFT HAND   Final   Special Requests BOTTLES DRAWN AEROBIC ONLY 2 CC   Final   Culture  Setup Time     Final   Value: 10/03/2014 17:13     Performed at [facility]   Culture     Final   Value: NO GROWTH 5 DAYS     Performed at [facility]   Report Status 10/09/2014 FINAL   Final     Scheduled Meds:  Scheduled Meds: . sodium chloride   Intravenous Once  . carvedilol  3.125 mg Oral BID WC  . feeding supplement (ENSURE COMPLETE)  237 mL Oral BID BM  . furosemide  40 mg Oral BID  . heparin subcutaneous  5,000 Units Subcutaneous 3 times per day  . insulin aspart  0-20 Units Subcutaneous 6  times per day  . insulin glargine  16 Units Subcutaneous Daily  . lisinopril  5 mg Oral Daily  . pantoprazole  40 mg Oral BID  . sodium chloride  10-40 mL Intracatheter Q12H    Time spent on care of this patient: 35 mins   Osvaldo Shipper , MD   [facility] Office  [phone] Pager - [phone]  On-Call/Text Page:      [email or web address]      password [email or web address]  If 7PM-7AM, please contact night-coverage [email or web address] Password [email or web address] 10/12/2014, 9:11 AM   LOS: 10 days

## 2014-10-12 NOTE — Progress Notes (Signed)
Subjective:  Anxious, no chest pain or shortness of breath. Multiple concerns regarding his treatment options and diagnoses that were discussed with him.  Objective:  Vital Signs in the last 24 hours: BP 125/60  Pulse 76  Temp(Src) 97.7 F (36.5 C) (Oral)  Resp 18  Ht 5\' 10"  (1.778 m)  Wt 118 kg (260 lb 2.3 oz)  BMI 37.33 kg/m2  SpO2 90%  Physical Exam: Obese white male pleasant, somewhat anxious in no acute distress Lungs:  Clear Cardiac:  Regular rhythm, normal S1 and S2, no S3 Extremities:  No edema present Central catheter present in neck  Intake/Output from previous day: 10/24 0701 - 10/25 0700 In: 240 [P.O.:240] Out: 400 [Urine:400]  Weight Filed Weights   10/10/14 0344 10/11/14 0500 10/12/14 0410  Weight: 117.7 kg (259 lb 7.7 oz) 118.1 kg (260 lb 5.8 oz) 118 kg (260 lb 2.3 oz)    Lab Results: Basic Metabolic Panel:  Recent Labs  22/63/33 0230 10/11/14 0430  NA 139 139  K 4.1 3.8  CL 101 101  CO2 29 31  GLUCOSE 145* 92  BUN 42* 33*  CREATININE 1.17 1.09   CBC:  Recent Labs  10/11/14 0500 10/12/14 0812  WBC 4.1 3.9*  NEUTROABS 3.1 3.0  HGB 8.4* 9.2*  HCT 25.6* 28.3*  MCV 90.8 90.1  PLT 58* 58*   Telemetry: Sinus  Assessment/Plan:  1. Recent out of hospital cardiac arrest due to ischemic cardiomyopathy 2. Ischemic cardiomyopathy with severe three-vessel disease 3. Liver disease with thrombocytopenia and cirrhosis 4. Anemia and thrombocytopenia  Recommendations:  Difficult situation. EP has seen and question about whether he is a candidate for a LifeVest or not. Also decision needs to be made about whether he would be a potential candidate for revascularization, if so per Dr. Lubertha Basque note LifeVest may be an option. Try to get central line out. Suggest joint conference with interventional, EP and general cardiology tomorrow to determine future course.    Darden Palmer  MD [facility]  10/12/2014, 10:03 AM

## 2014-10-12 NOTE — Progress Notes (Signed)
Palliative medicine team meeting with patient and his sister scheduled for tomorrow 10/25 at 2:30PM. Full consult and recommendations to follow.  Anderson Malta, DO Palliative Medicine

## 2014-10-13 ENCOUNTER — Inpatient Hospital Stay (HOSPITAL_COMMUNITY): Payer: 59

## 2014-10-13 DIAGNOSIS — I255 Ischemic cardiomyopathy: Secondary | ICD-10-CM

## 2014-10-13 DIAGNOSIS — I2510 Atherosclerotic heart disease of native coronary artery without angina pectoris: Secondary | ICD-10-CM

## 2014-10-13 DIAGNOSIS — I2589 Other forms of chronic ischemic heart disease: Secondary | ICD-10-CM

## 2014-10-13 DIAGNOSIS — I5023 Acute on chronic systolic (congestive) heart failure: Secondary | ICD-10-CM

## 2014-10-13 LAB — BASIC METABOLIC PANEL
Anion gap: 10 (ref 5–15)
BUN: 31 mg/dL — ABNORMAL HIGH (ref 6–23)
CHLORIDE: 101 meq/L (ref 96–112)
CO2: 29 mEq/L (ref 19–32)
CREATININE: 1.17 mg/dL (ref 0.50–1.35)
Calcium: 8.5 mg/dL (ref 8.4–10.5)
GFR, EST AFRICAN AMERICAN: 81 mL/min — AB (ref >90)
GFR, EST NON AFRICAN AMERICAN: 70 mL/min — AB (ref >90)
Glucose, Bld: 83 mg/dL (ref 70–99)
Potassium: 4.3 mEq/L (ref 3.7–5.3)
Sodium: 140 mEq/L (ref 137–147)

## 2014-10-13 LAB — CBC
HCT: 27.4 % — ABNORMAL LOW (ref 39.0–52.0)
HEMOGLOBIN: 9 g/dL — AB (ref 13.0–17.0)
MCH: 29.7 pg (ref 26.0–34.0)
MCHC: 32.8 g/dL (ref 30.0–36.0)
MCV: 90.4 fL (ref 78.0–100.0)
Platelets: 50 10*3/uL — ABNORMAL LOW (ref 150–400)
RBC: 3.03 MIL/uL — ABNORMAL LOW (ref 4.22–5.81)
RDW: 17.1 % — AB (ref 11.5–15.5)
WBC: 4 10*3/uL (ref 4.0–10.5)

## 2014-10-13 LAB — GLUCOSE, CAPILLARY
GLUCOSE-CAPILLARY: 165 mg/dL — AB (ref 70–99)
GLUCOSE-CAPILLARY: 202 mg/dL — AB (ref 70–99)
GLUCOSE-CAPILLARY: 92 mg/dL (ref 70–99)
Glucose-Capillary: 105 mg/dL — ABNORMAL HIGH (ref 70–99)
Glucose-Capillary: 125 mg/dL — ABNORMAL HIGH (ref 70–99)
Glucose-Capillary: 60 mg/dL — ABNORMAL LOW (ref 70–99)
Glucose-Capillary: 81 mg/dL (ref 70–99)

## 2014-10-13 MED ORDER — ATORVASTATIN CALCIUM 20 MG PO TABS
20.0000 mg | ORAL_TABLET | Freq: Every day | ORAL | Status: DC
  Administered 2014-10-13 – 2014-10-17 (×5): 20 mg via ORAL
  Filled 2014-10-13 (×7): qty 1

## 2014-10-13 MED ORDER — ATORVASTATIN CALCIUM 40 MG PO TABS
40.0000 mg | ORAL_TABLET | Freq: Every day | ORAL | Status: DC
  Filled 2014-10-13: qty 1

## 2014-10-13 MED ORDER — AMIODARONE HCL 200 MG PO TABS
200.0000 mg | ORAL_TABLET | Freq: Two times a day (BID) | ORAL | Status: DC
  Filled 2014-10-13 (×2): qty 1

## 2014-10-13 MED ORDER — GADOBENATE DIMEGLUMINE 529 MG/ML IV SOLN
20.0000 mL | Freq: Once | INTRAVENOUS | Status: AC | PRN
  Administered 2014-10-13: 37 mL via INTRAVENOUS

## 2014-10-13 MED ORDER — SPIRONOLACTONE 12.5 MG HALF TABLET
12.5000 mg | ORAL_TABLET | Freq: Every day | ORAL | Status: DC
  Administered 2014-10-13: 12.5 mg via ORAL
  Filled 2014-10-13 (×2): qty 1

## 2014-10-13 MED ORDER — FUROSEMIDE 10 MG/ML IJ SOLN
40.0000 mg | Freq: Two times a day (BID) | INTRAMUSCULAR | Status: DC
  Administered 2014-10-13 (×2): 40 mg via INTRAVENOUS
  Filled 2014-10-13 (×4): qty 4

## 2014-10-13 MED ORDER — LIDOCAINE 5 % EX PTCH
1.0000 | MEDICATED_PATCH | CUTANEOUS | Status: DC
  Administered 2014-10-13 – 2014-10-17 (×5): 1 via TRANSDERMAL
  Filled 2014-10-13 (×7): qty 1

## 2014-10-13 MED ORDER — ASPIRIN 81 MG PO CHEW
81.0000 mg | CHEWABLE_TABLET | Freq: Every day | ORAL | Status: DC
  Administered 2014-10-13 – 2014-10-18 (×6): 81 mg via ORAL
  Filled 2014-10-13 (×7): qty 1

## 2014-10-13 NOTE — Progress Notes (Signed)
Physical Therapy Treatment Patient Details Name: [name] MRN: [id number] DOB: 04/05/1961 Today's Date: 10/13/2014    History of Present Illness Pt adm with cardiac arrest. Underwent artic sun. Pt with resp failure and intubated and acute kidney injury. 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 Not operative candidate. May need life vest. PMH - ischemic cardiomyopathy EF 35-40%, cirrhosis, DM, lt toe amputation, peripheral neuropathy.    PT Comments    Patient progressing well towards physical therapy goals; ambulating up to 225 feet with a rolling walker under supervision for safety, and no loss of balance noted during bout. Pt reports he will have 1 step to ascend in order to enter his house and therefore goals have been updated. Plan to complete this training tomorrow. He is eager to return home and will have supervision from his brother and sister as needed.  Follow Up Recommendations  Home health PT;Supervision/Assistance - 24 hour     Equipment Recommendations  3in1 (PT)    Recommendations for Other Services OT consult     Precautions / Restrictions Precautions Precautions: Fall Restrictions Weight Bearing Restrictions: No    Mobility  Bed Mobility                  Transfers Overall transfer level: Needs assistance Equipment used: Rolling walker (2 wheeled) Transfers: Sit to/from Stand Sit to Stand: Supervision         General transfer comment: Supervision for safety with VC for hand placement and technique to reduce stress over broken ribs. Decreased control with descent into chair but safely positions himself prior to sitting. Performed from reclining chair today.  Ambulation/Gait Ambulation/Gait assistance: Supervision Ambulation Distance (Feet): 225 Feet Assistive device: Rolling walker (2 wheeled) Gait Pattern/deviations: Step-through pattern Gait velocity: decreased   General Gait Details: Supervision for safety. No loss of  balance noted while patient using a rolling walker. No complaints of dizziness or shortness of breat. SpO2 maintained 91% and greater during bout. Instructions for pursed lip breathing to increase SpO2 while on room air. HR in low 90s.   Stairs            Wheelchair Mobility    Modified Rankin (Stroke Patients Only)       Balance                                    Cognition Arousal/Alertness: Awake/alert Behavior During Therapy: WFL for tasks assessed/performed Overall Cognitive Status: Within Functional Limits for tasks assessed                      Exercises      General Comments General comments (skin integrity, edema, etc.): Feet edematous. Discussed d/c planning with pt, his sister, and MD. Goals he will need to achieve in order to return home safely.      Pertinent Vitals/Pain Pain Assessment: 0-10 Pain Score:  ("ribs hurt only when I try to get out of a chair" no value) Pain Location: Lt ribs near sternum Pain Intervention(s): Monitored during session;Repositioned    Home Living                      Prior Function            PT Goals (current goals can now be found in the care plan section) Acute Rehab PT Goals Patient Stated Goal: Return home PT  Goal Formulation: With patient Time For Goal Achievement: 10/16/14 Potential to Achieve Goals: Good Progress towards PT goals: Progressing toward goals    Frequency  Min 3X/week    PT Plan Current plan remains appropriate    Co-evaluation             End of Session Equipment Utilized During Treatment: Gait belt Activity Tolerance: Patient tolerated treatment well Patient left: in chair;with call bell/phone within reach;with family/visitor present (MD in room)     Time: [id number] PT Time Calculation (min): 24 min  Charges:  $Gait Training: 8-22 mins $Self Care/Home Management: 8-22                    G Codes:      Charlsie MerlesLogan Secor Irish Breisch,  [place]PT [phone]  Berton MountBarbour, Rosette Bellavance S 10/13/2014, 4:45 PM

## 2014-10-13 NOTE — Progress Notes (Signed)
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  Pt extubated 10/19 and passed swallow evaluation 10/20.   Pt transferred to [facility]Cardiac from [facility] 10/24.  Continues on a Dysphagia 3, Carbohydrate Modified diet.  PO intake variable at 50-100% per flowsheet records.  Receiving Ensure Complete supplements BID.  Meeting with Palliative Care Team held 10/25.  Height: Ht Readings from Last 1 Encounters:  10/02/14 5\' 10"  (1.778 m)    Weight: Wt Readings from Last 1 Encounters:  10/13/14 255 lb 12.8 oz (116.03 kg)    BMI:  Body mass index is 36.7 kg/(m^2).  Estimated Nutritional Needs: Kcal: 2200-2400 Protein: 110-120 gm Fluid: per MD  Skin: laceration on head  Diet Order: Dysphagia 3, thin liquids   Intake/Output Summary (Last 24 hours) at 10/13/14 1117 Last data filed at 10/13/14 1023  Gross per 24 hour  Intake    246 ml  Output    400 ml  Net   -154 ml  \ Labs:   Recent Labs Lab 10/09/14 0410 10/10/14 0230 10/11/14 0430 10/11/14 0500 10/12/14 0812 10/13/14 0335  NA 140 139 139  --   --  140  K 4.2 4.1 3.8  --   --  4.3  CL 104 101 101  --   --  101  CO2 28 29 31   --   --  29  BUN 51* 42* 33*  --   --  31*  CREATININE 1.37* 1.17 1.09  --   --  1.17  CALCIUM 8.1* 8.3* 8.3*  --   --  8.5  MG 2.7* 2.3  --  2.0 1.8  --   PHOS 3.3 4.3 4.8*  --   --   --   GLUCOSE 171* 145* 92  --   --  83    CBG (last 3)   Recent Labs  10/12/14 2351 10/13/14 0411 10/13/14 0755  GLUCAP 92 81 125*    Scheduled Meds: .  sodium chloride   Intravenous Once  . aspirin  81 mg Oral Daily  . atorvastatin  20 mg Oral q1800  . carvedilol  3.125 mg Oral BID WC  . feeding supplement (ENSURE COMPLETE)  237 mL Oral BID BM  . furosemide  40 mg Intravenous BID  . insulin aspart  0-20 Units Subcutaneous 6 times per day  . insulin glargine  16 Units Subcutaneous Daily  . lisinopril  5 mg Oral Daily  . pantoprazole  40 mg Oral BID  . sodium chloride  10-40 mL Intracatheter Q12H  . spironolactone  12.5 mg Oral Daily    Continuous Infusions: . sodium chloride 10 mL/hr at 10/09/14 0945    Maureen Chatters, RD, LDN Pager #: [phone] After-Hours Pager #: [phone]

## 2014-10-13 NOTE — Progress Notes (Signed)
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 Pt suddenly collapsed, fell backward and hit his head. AED advised shockable rhythm. Approx 10 minutes CPR with ROSC before EMS arrival. Patient was admitted to [facility] service. He underwent hypothermic protocol, and placed on pressors. Patient was also placed on CRRT due to high potassium. On 10/06/2014 he was extubated. Patient was evaluated by nephrology during this hospitalization. His Acute kidney injury in setting of cardiac arrest has resolved.  By 10/07/2014 CVVH was discontinued. He was given Lasix. On 10/10/2014 he underwent cardiac catheterization which showed severe three-vessel obstructive coronary artery disease, did not undergo percutaneous intervention. He was seen by cardiothoracic surgery but CABG was not considered a good option secondary to comorbidities. Case discussed with cardiology and question of PCI will be reconsidered in the future if the thrombocytopenia improves. PMT to meet with patient and family today.  Subjective: Patient continues to feel well. Denies any shortness of breath. Still with some pain over chest from when he underwent CPR.   Assessment/Plan: Cardiac arrest presumably from ischemic cardiomyopathy - EF 35-40% per echo 07/2014, but 19% per nuclear study  -On 10/10/2014 patient undergoing cardiac catheterization which showed severe three-vessel obstructive coronary artery disease. He did not undergo percutaneous intervention due to low platelets. Coronary artery bypass grafting not a good option given multiple comorbidities. - EP consulted was consulted and might not be a ICD nor a life vest candidate. Cardiac MRI ordered. Palliative medicine input has been  sought. -Continue Lasix which has been changed to IV by cardiology. Carvedilol 3.125 mg twice a day       - Possibility of PCI would be readdressed in the future if thrombocytopenia improves                                   Acute respiratory failure, post arrest  - resolved   Apical left pneumothax noted on CXR 10/15, -resolved  -O2 as needed for O2 saturation > 92%   Pulmonary edema/pleural effusion/Pedal Edema -Strict in and out -Daily weight -On IV Lasix.  Bradycardia, junctional - resolved with rewarming  Hyperkalemia -resolved   AKI  -Likely secondary to cardiac arrest - Improved  Hx cholelithiasis  -deemed not to be a surgical candidate for cholecystectomy recently.  Hx cirrhosis/?NASH v ETOH  -not felt to be a surgical liver transplant candidate both due to multiple comorbidites  -Dysphagia diet/carbohydrate modified  -Continue Change PPI daily   Severe thrombocytopenia due to acute illness and cirrhosis (splenomegally?, clumping)  -stable.   Anemia - stable - no clear source of bleeding  - PRBC for goal hgb > 8gm%   DM type II -Continue Lantus and SSI  DVT Prophylaxis: SCD's Code Status: FULL Family Communication: Discussed with patient Disposition Plan: Per cardiology   Consultants: Cardiology, EP, Nephrology, PMT   Procedure/Significant Events: 10/15 hyperkalemia (on aldactone & lisinopril) requiring CRRT  CT head 10/15>>>neg, Small air-fluid level in the left maxillary sinus and right sphenoid sinus  10/03/14 : : Cooled to 33  Bradycardic , on max pressors  Anuric  Sedated , paralysed  On CRRT   10/04/14  On CRRT for high K., on pressors., Just rewarmed: following all commands,  received 1 U PRBC   10/18 still on pressors, received 2 U PRBC  10/19 > extubated, off all pressors, following commands  10/20> passed swallow eval, CVVHD off, given lasix made great urine 10/22 CXR Moderate CHF with bilateral pleural effusions and mild  pulmonary vascular congestion.   Culture 10/16 blood > neg   Antibiotics: 10/16 unasyn > 10/19   LINES / TUBES:  CVL R IJ CVL 10/15   Objective: VITAL SIGNS:  BP 123/61  Pulse 81  Temp(Src) 98.1 F (36.7 C) (Oral)  Resp 18  Ht 5\' 10"  (1.778 m)  Wt 116.03 kg (255 lb 12.8 oz)  BMI 36.70 kg/m2  SpO2 91%   Exam: General: A./O x4, NAD,  Lungs: Improved air entry bilaterally. Very few crackles at bases. No wheezing.  Cardiovascular: Regular rate and rhythm without murmur gallop or rub normal S1 and S2 Abdomen: Nontender, nondistended, soft, bowel sounds positive, no rebound, no ascites, no appreciable mass Extremities: 2+ pitting edema bilateral LE  Data Reviewed: Basic Metabolic Panel:  Recent Labs Lab 10/07/14 1600 10/08/14 0445 10/09/14 0410 10/10/14 0230 10/11/14 0430 10/11/14 0500 10/12/14 0812 10/13/14 0335  NA 137 139 140 139 139  --   --  140  K 4.6 4.5 4.2 4.1 3.8  --   --  4.3  CL 101 103 104 101 101  --   --  101  CO2 25 26 28 29 31   --   --  29  GLUCOSE 160* 159* 171* 145* 92  --   --  83  BUN 45* 52* 51* 42* 33*  --   --  31*  CREATININE 1.53* 1.62* 1.37* 1.17 1.09  --   --  1.17  CALCIUM 7.8* 8.1* 8.1* 8.3* 8.3*  --   --  8.5  MG  --  2.5 2.7* 2.3  --  2.0 1.8  --   PHOS 3.0 3.4 3.3 4.3 4.8*  --   --   --    Liver Function Tests:  Recent Labs Lab 10/07/14 0442  10/07/14 1600 10/08/14 0445 10/09/14 0410 10/10/14 0230 10/11/14 0430  AST 67*  --   --   --   --   --   --   ALT 44  --   --   --   --   --   --   ALKPHOS 112  --   --   --   --   --   --   BILITOT 1.4*  --   --   --   --   --   --   PROT 4.8*  --   --   --   --   --   --   ALBUMIN 2.2*  < > 2.3* 2.4* 2.3* 2.3* 2.3*  < > = values in this interval not displayed.  CBC:  Recent Labs Lab 10/07/14 0442  10/09/14 0410 10/10/14 0200 10/11/14 0500 10/12/14 0812 10/13/14 0335  WBC 3.4*  < > 4.7 4.8 4.1 3.9* 4.0  NEUTROABS 2.5  --   --  3.7 3.1 3.0  --   HGB 8.1*  < > 8.1*  8.5* 8.4* 9.2* 9.0*  HCT 23.8*  < > 24.4* 25.2* 25.6* 28.3* 27.4*  MCV 89.1  < > 89.4 90.0 90.8 90.1 90.4  PLT 19*  < > 44* 58* 58* 58* 50*  < > = values in this interval not displayed.  CBG:  Recent Labs Lab 10/12/14 1701 10/12/14 1938 10/12/14  2351 10/13/14 0411 10/13/14 0755  GLUCAP 134* 173* 92 81 125*    Recent Results (from the past 240 hour(s))  CULTURE, BLOOD (ROUTINE X 2)     Status: None   Collection Time    10/03/14 11:15 AM      Result Value Ref Range Status   Specimen Description BLOOD LEFT ARM   Final   Special Requests BOTTLES DRAWN AEROBIC ONLY 5 CC   Final   Culture  Setup Time     Final   Value: 10/03/2014 17:15     Performed at [facility]   Culture     Final   Value: NO GROWTH 5 DAYS     Performed at [facility]   Report Status 10/09/2014 FINAL   Final  CULTURE, BLOOD (ROUTINE X 2)     Status: None   Collection Time    10/03/14 11:25 AM      Result Value Ref Range Status   Specimen Description BLOOD LEFT HAND   Final   Special Requests BOTTLES DRAWN AEROBIC ONLY 2 CC   Final   Culture  Setup Time     Final   Value: 10/03/2014 17:13     Performed at [facility]   Culture     Final   Value: NO GROWTH 5 DAYS     Performed at [facility]   Report Status 10/09/2014 FINAL   Final     Scheduled Meds:  Scheduled Meds: . sodium chloride   Intravenous Once  . aspirin  81 mg Oral Daily  . atorvastatin  20 mg Oral q1800  . carvedilol  3.125 mg Oral BID WC  . feeding supplement (ENSURE COMPLETE)  237 mL Oral BID BM  . furosemide  40 mg Intravenous BID  . insulin aspart  0-20 Units Subcutaneous 6 times per day  . insulin glargine  16 Units Subcutaneous Daily  . lisinopril  5 mg Oral Daily  . pantoprazole  40 mg Oral BID  . sodium chloride  10-40 mL Intracatheter Q12H  . spironolactone  12.5 mg Oral Daily    Time spent on care of this patient: 35 mins   Osvaldo ShipperKRISHNAN,Husna Krone , MD   [facility] Office   [phone] Pager - [phone]  On-Call/Text Page:      [email or web address]      password [email or web address]  If 7PM-7AM, please contact night-coverage [email or web address] Password [email or web address] 10/13/2014, 10:15 AM   LOS: 11 days

## 2014-10-13 NOTE — Progress Notes (Signed)
Patient ID: [name], male   DOB: [birth year], 53 y.o.   MRN: [id number]   Patient with history of ETOH cirrhosis and recent episode of biliary sepsis/gallstone pancreatitis in 8/15.  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  Cardiomyopathy with EF 35-40% by echo in 8/15.  Cardiolite 8/15 with inferior and apical scar.   Admitted 10/02/14 after cardiac arrest (likely vfib) terminated by defibrillation x 2 by AED.  Initially underwent hypothermia protocol, has recovered well, now awake/alert, ambulating, etc.  Platelets remain low.    LHC (10/10/14) with 80-90% proximal ramus, 90% mLAD, subtotalled dLAD, 90% D1, 90% dRCA/proxPDA, LVEDP 27. No intervention with low plts.   Patient is doing well today.  No dyspnea walking with walker.  Has a lot of lower extremity swelling.  Pain with deep breathing due to rib fractures with CPR.   Scheduled Meds: . sodium chloride   Intravenous Once  . amiodarone  200 mg Oral BID  . aspirin  81 mg Oral Daily  . atorvastatin  40 mg Oral q1800  . carvedilol  3.125 mg Oral BID WC  . feeding supplement (ENSURE COMPLETE)  237 mL Oral BID BM  . furosemide  40 mg Intravenous BID  . insulin aspart  0-20 Units Subcutaneous 6 times per day  . insulin glargine  16 Units Subcutaneous Daily  . lisinopril  5 mg Oral Daily  . pantoprazole  40 mg Oral BID  . sodium chloride  10-40 mL Intracatheter Q12H  . spironolactone  12.5 mg Oral Daily   Continuous Infusions: . sodium chloride 10 mL/hr at 10/09/14 0945   PRN Meds:.albuterol, fentaNYL, labetalol, ondansetron (ZOFRAN) IV, sodium chloride, traMADol    Filed Vitals:   10/11/14 2045 10/12/14 0410 10/12/14 1944 10/13/14 0559  BP: 121/62 125/60 120/63 123/61  Pulse: 75 76 70 81  Temp: 97.9 F (36.6 C) 97.7 F (36.5 C) 98.4 F (36.9 C) 98.1 F (36.7 C)  TempSrc: Oral Oral Oral Oral  Resp: 18  18 18   Height:      Weight:  260 lb 2.3 oz (118 kg)  255 lb 12.8 oz (116.03 kg)    SpO2: 95% 90% 96% 91%    Intake/Output Summary (Last 24 hours) at 10/13/14 0825 Last data filed at 10/13/14 0604  Gross per 24 hour  Intake      3 ml  Output    400 ml  Net   -397 ml    LABS: Basic Metabolic Panel:  Recent Labs  37/10/62 0430 10/11/14 0500 10/12/14 0812 10/13/14 0335  NA 139  --   --  140  K 3.8  --   --  4.3  CL 101  --   --  101  CO2 31  --   --  29  GLUCOSE 92  --   --  83  BUN 33*  --   --  31*  CREATININE 1.09  --   --  1.17  CALCIUM 8.3*  --   --  8.5  MG  --  2.0 1.8  --   PHOS 4.8*  --   --   --    Liver Function Tests:  Recent Labs  10/11/14 0430  ALBUMIN 2.3*   No results found for this basename: LIPASE, AMYLASE,  in the last 72 hours CBC:  Recent Labs  10/11/14 0500 10/12/14 0812 10/13/14 0335  WBC 4.1 3.9* 4.0  NEUTROABS 3.1 3.0  --   HGB 8.4* 9.2* 9.0*  HCT 25.6* 28.3* 27.4*  MCV 90.8 90.1 90.4  PLT 58* 58* 50*   Cardiac Enzymes: No results found for this basename: CKTOTAL, CKMB, CKMBINDEX, TROPONINI,  in the last 72 hours BNP: No components found with this basename: POCBNP,  D-Dimer: No results found for this basename: DDIMER,  in the last 72 hours Hemoglobin A1C: No results found for this basename: HGBA1C,  in the last 72 hours Fasting Lipid Panel: No results found for this basename: CHOL, HDL, LDLCALC, TRIG, CHOLHDL, LDLDIRECT,  in the last 72 hours Thyroid Function Tests: No results found for this basename: TSH, T4TOTAL, FREET3, T3FREE, THYROIDAB,  in the last 72 hours Anemia Panel: No results found for this basename: VITAMINB12, FOLATE, FERRITIN, TIBC, IRON, RETICCTPCT,  in the last 72 hours  RADIOLOGY: Ct Head Wo Contrast  10/02/2014   CLINICAL DATA:  53 year old male with history of trauma after after a fall during a syncopal episode during which the patient fell backward and struck his head. Status post CPR.  EXAM: CT HEAD WITHOUT CONTRAST  TECHNIQUE: Contiguous axial images were obtained from the base of  the skull through the vertex without intravenous contrast.  COMPARISON:  No priors.  FINDINGS: No acute displaced skull fractures are identified. No acute intracranial abnormality. Specifically, no evidence of acute post-traumatic intracranial hemorrhage, no definite regions of acute/subacute cerebral ischemia, no focal mass, mass effect, hydrocephalus or abnormal intra or extra-axial fluid collections. The visualized paranasal sinuses and mastoids are well pneumatized, with exception of a small air-fluid level in the posterior aspect of the left maxillary sinus and right sphenoid sinus.  IMPRESSION: 1. Small amount of soft tissue swelling in the occipital scalp, without underlying displaced skull fracture or acute intracranial abnormalities. 2. Small air-fluid level in the left maxillary sinus and right sphenoid sinus. This is nonspecific, but correlation for signs of sinusitis is recommended.   Electronically Signed   By: Trudie Reed M.D.   On: 10/02/2014 16:36   PHYSICAL EXAM General: NAD Neck: Thick, JVP difficult, no thyromegaly or thyroid nodule.  Lungs: Decreased breath sounds at bases bilaterally.  CV: Nondisplaced PMI.  Heart regular S1/S2, no S3/S4, 2/6 early SEM RUSB.  2+ edema to knees bilaterally.   Abdomen: Soft, nontender, no hepatosplenomegaly, mild distention.  Neurologic: Alert and oriented x 3.  Psych: Normal affect. Extremities: No clubbing or cyanosis.   TELEMETRY: Reviewed telemetry pt in NSR  ASSESSMENT AND PLAN: 53 yo with history of ETOH cirrhosis, gallstone pancreatitis, chronic thrombocytopenia, and cardiomyopathy presented with presumed vfib arrest s/p defibrillation with AED.  He was cooled and recovered well neurologically.  LHC showed severe 2 VD, no intervention given thrombocytopenia.  1. Cardiac arrest: Suspect ventricular fibrillation, likely scar-mediated in the setting of ischemic cardiomyopathy (troponin did not go particularly high).  EF 35-40% by echo in  8/15, not repeated yet this admission. Difficult situation: turned down for the time being for ICD given life expectancy estimation < 1 year.  Could consider Lifevest, but if there is no plan for revascularization there would be no end point. As he already has cirrhosis, I would like to avoid amiodarone use.  Will gradually titrate up beta blocker.  2. CAD: See cath report above.  Too high risk for CABG.  Mid LAD and distal RCA/proximal PDA are potential targets for PCI.  However, there are two issues with this.  There may not be viable tissue, and thrombocytopenia will make use of DAPT difficult.  - I will arrange for cardiac MRI  to look for viability in LAD and RCA territories.   If territory is viable, will need to follow platelets for a while.  Historically, plt count has ranged 36-120.  If we could keep plts in the 75+ range, could consider PCI with BMS.  Will review with interventional colleagues.   - With plts 50+, I am going to start him on ASA 81 mg daily.  - Will add low dose statin given stable LFTs (atorvastatin 20).  3. Acute on chronic systolic CHF: EF 29-56%35-40% in 8/15.  Will reassess LV function on cardiac MRI today.  He is volume overloaded on exam.  - Continue current Coreg and lisinopril.  - Stop po Lasix, use Lasix 40 mg IV bid.  - Add spironolactone 12.5 mg daily.  4. Cirrhosis: Related to prior heavy ETOH.  He has now quit drinking.  Transaminases have only been mildly elevated.  Will check LFTs tomorrow am.  5. Chronic thrombocytopenia: Likely due to splenomegaly in the setting of cirrhosis.  Also possible ETOH-related marrow suppression.  See discussion above.  Will need to follow plts over time to see if PCI will be an option.  6. Bicuspid aortic valve: Mild AS.  7. CKD: Creatinine stable.  8. Type II diabetes: Continue insulin.  9. H/o biliary pancreatitis and sepsis: Still has gallbladder, was deemed too high risk for surgery in 8/15.  10. Ambulated with PT 11. To have  palliative care conference with patient and sister today to discuss goals of care.   Marca AnconaDalton Diyan Dave 10/13/2014 8:45 AM
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
## 2014-10-14 ENCOUNTER — Encounter: Payer: 59 | Admitting: Cardiology

## 2014-10-14 LAB — COMPREHENSIVE METABOLIC PANEL
ALBUMIN: 2.5 g/dL — AB (ref 3.5–5.2)
ALT: 16 U/L (ref 0–53)
AST: 28 U/L (ref 0–37)
Alkaline Phosphatase: 132 U/L — ABNORMAL HIGH (ref 39–117)
Anion gap: 8 (ref 5–15)
BUN: 27 mg/dL — ABNORMAL HIGH (ref 6–23)
CALCIUM: 8.8 mg/dL (ref 8.4–10.5)
CO2: 31 meq/L (ref 19–32)
CREATININE: 1.2 mg/dL (ref 0.50–1.35)
Chloride: 99 mEq/L (ref 96–112)
GFR calc Af Amer: 79 mL/min — ABNORMAL LOW (ref >90)
GFR, EST NON AFRICAN AMERICAN: 68 mL/min — AB (ref >90)
Glucose, Bld: 82 mg/dL (ref 70–99)
Potassium: 4.4 mEq/L (ref 3.7–5.3)
SODIUM: 138 meq/L (ref 137–147)
Total Bilirubin: 1.1 mg/dL (ref 0.3–1.2)
Total Protein: 5.4 g/dL — ABNORMAL LOW (ref 6.0–8.3)

## 2014-10-14 LAB — GLUCOSE, CAPILLARY
GLUCOSE-CAPILLARY: 83 mg/dL (ref 70–99)
GLUCOSE-CAPILLARY: 190 mg/dL — AB (ref 70–99)
Glucose-Capillary: 101 mg/dL — ABNORMAL HIGH (ref 70–99)
Glucose-Capillary: 104 mg/dL — ABNORMAL HIGH (ref 70–99)
Glucose-Capillary: 138 mg/dL — ABNORMAL HIGH (ref 70–99)
Glucose-Capillary: 191 mg/dL — ABNORMAL HIGH (ref 70–99)

## 2014-10-14 LAB — CBC
HCT: 28.9 % — ABNORMAL LOW (ref 39.0–52.0)
Hemoglobin: 9.3 g/dL — ABNORMAL LOW (ref 13.0–17.0)
MCH: 30 pg (ref 26.0–34.0)
MCHC: 32.2 g/dL (ref 30.0–36.0)
MCV: 93.2 fL (ref 78.0–100.0)
PLATELETS: 65 10*3/uL — AB (ref 150–400)
RBC: 3.1 MIL/uL — AB (ref 4.22–5.81)
RDW: 17.1 % — ABNORMAL HIGH (ref 11.5–15.5)
WBC: 3.6 10*3/uL — ABNORMAL LOW (ref 4.0–10.5)

## 2014-10-14 LAB — PRO B NATRIURETIC PEPTIDE: PRO B NATRI PEPTIDE: 14218 pg/mL — AB (ref 0–125)

## 2014-10-14 MED ORDER — CLOPIDOGREL BISULFATE 300 MG PO TABS
300.0000 mg | ORAL_TABLET | Freq: Once | ORAL | Status: AC
  Administered 2014-10-14: 300 mg via ORAL
  Filled 2014-10-14: qty 1

## 2014-10-14 MED ORDER — SPIRONOLACTONE 25 MG PO TABS
25.0000 mg | ORAL_TABLET | Freq: Every day | ORAL | Status: DC
  Administered 2014-10-14 – 2014-10-18 (×5): 25 mg via ORAL
  Filled 2014-10-14 (×5): qty 1

## 2014-10-14 MED ORDER — FUROSEMIDE 10 MG/ML IJ SOLN
40.0000 mg | Freq: Three times a day (TID) | INTRAMUSCULAR | Status: DC
  Administered 2014-10-14 (×3): 40 mg via INTRAVENOUS
  Filled 2014-10-14 (×5): qty 4

## 2014-10-14 MED ORDER — POTASSIUM CHLORIDE CRYS ER 20 MEQ PO TBCR
40.0000 meq | EXTENDED_RELEASE_TABLET | Freq: Once | ORAL | Status: AC
  Administered 2014-10-14: 40 meq via ORAL
  Filled 2014-10-14: qty 2

## 2014-10-14 MED ORDER — CARVEDILOL 6.25 MG PO TABS
6.2500 mg | ORAL_TABLET | Freq: Two times a day (BID) | ORAL | Status: DC
  Administered 2014-10-14 – 2014-10-18 (×9): 6.25 mg via ORAL
  Filled 2014-10-14 (×10): qty 1
  Filled 2014-10-14: qty 2
  Filled 2014-10-14 (×2): qty 1

## 2014-10-14 NOTE — Progress Notes (Signed)
Inpatient Diabetes Program Recommendations  AACE/ADA: New Consensus Statement on Inpatient Glycemic Control (2013)  Target Ranges:  Prepandial:   less than 140 mg/dL      Peak postprandial:   less than 180 mg/dL (1-2 hours)      Critically ill patients:  140 - 180 mg/dL   Results for UZZIAH, HUSER (MRN [id number]) as of 10/14/2014 09:07  Ref. Range 10/13/2014 07:55 10/13/2014 11:34 10/13/2014 16:41 10/13/2014 20:05 10/13/2014 23:54 10/14/2014 00:27 10/14/2014 04:54 10/14/2014 08:01  Glucose-Capillary Latest Range: 70-99 mg/dL 747 (H) 185 (H) 501 (H) 165 (H) 60 (L) 101 (H) 83 104 (H)   Note:  Patient experieced hypoglycemia around midnight after receiving Novolog 5 units to cover a CBG of 165 at 20:05.    Recommendation: Change timing of correction to tid with meals.   Thank you.  Queena Monrreal S. Elsie Lincoln, RN, CNS, CDE Inpatient Diabetes Program, team pager [phone]

## 2014-10-14 NOTE — Evaluation (Signed)
Clinical/Bedside Swallow Evaluation Patient Details  Name: Ralph BuckerRobert A Walker MRN: [id number] Date of Birth: 05-Feb-1961  Today's Date: 10/14/2014 Time:  -     Past Medical History:  Past Medical History  Diagnosis Date  . Heart murmur     ECHO 07/2011 showed mild AS and LVH.  TEE 09/2011 showed small PFO, bicuspid aortic valve but no stenosis or regurg.  [name]. 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  Gen surg and GI recommended flagyl and cipro x 2 wks..  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  . Cholelithiasis     u/s--no cholecystitis.  06/2014 u/s showed no stones, only GB sludge  . Peripheral neuropathy     Diabetic:  Autonomic (pelvic) and LE polyneuropathy--[facility] testing showed that it is likely from DM  . Diabetic foot ulcers     Poor healing; normal ABIs and TBIs  . Arthritis     back L3 - L5  . Hyperactive gag reflex   . History of pyelonephritis   . Type II or unspecified type diabetes mellitus with neurological manifestations, not stated as uncontrolled     IDDM: neuro, renal, and ophth complications  . Wears dentures     upper  . Osteomyelitis of toe of left foot     left 4th and 5th toes--now s/p amputation of these areas  . Diabetic retinopathy associated with type 2 diabetes mellitus     Laser tx in both eyes (Dr. [name])  . Diabetic nephropathy     Proteinuria and CrCl 55-65  . Chronic renal insufficiency, stage III (moderate)   . Lumbar spondylosis     MRI 08/25/11 with multilevel facet dz, small disc protrusion at L5-S1 to the right without impingement  . Ischemic cardiomyopathy 07/22/14    Myoview w/out ischemia but large scar (inferior/apex) w/ global hypokinesis--medical mgmt  . History of pancreatitis 2014 and 2015    Suspected biliary: passing small gallstones: surgery declined to remove GB 07/2014.  .  Aspiration pneumonia   . Thrombocytopenia   . Portal hypertension   . Liver cirrhosis secondary to NASH     ?+ alcohol?.  With hx of splenomegaly and ascities (dx approx 2012)  . History of gram negative sepsis 2015    e coli   Past Surgical History:  Past Surgical History  Procedure Laterality Date  . Knee arthroscopy  2001    Bilateral  . Amputation  10/24/2012    Procedure: AMPUTATION RAY;  Surgeon: [name] A Bednarz, MD;  Location: [facility];  Service: Orthopedics;  Laterality: Left;  left 4th toe amputation through MTP joint, 5th Ray amputation   . Knee surgery  2001  . Hida scan  06/2014    Normal  . Transthoracic echocardiogram  07/19/14    Mild LV dilation and hypertrophy, EF 35-40%, no wall motion abnormalities, grade II diast dysfxn, bicuspid aortic valve with mild AS and mild AI.  [name]. Cardiovascular stress test  07/2014    Myoview w/out ischemia but large scar (inferior/apex) w/ global hypokinesis--medical mgmt  . Eye surgery  08/2014    Retinal hemorrhage evacuation   HPI:  53 yo male with history ischemic cardiomyopathy, cirrhosis, DM, aspiration pna, perforation large intestine 2012, cholelithiasis admitted after cardiac arrest at work with EMS trained bystander CPR/defib.  Approx 10 minutes CPR.  Intubated 10/15-10/19.  CXR Stable support apparatus, No change in aeration to the left base.  RN repeorts significant nausea since extubation.   Assessment / Plan / Recommendation Clinical Impression  Pt exhibits normal chewing of solid foods, with no difficulty chewing peanut butter crackers and a [place] sandwich.  Will advance from Dys 3 diet to Regular consistency Carb Mod diet.  No f/u needd.    Aspiration Risk  Mild    Diet Recommendation Regular;Thin liquid   Liquid Administration via: Cup;Straw Medication Administration: Whole meds with liquid Supervision: Patient able to self feed;Intermittent supervision to cue for compensatory strategies Compensations:  Slow rate;Small sips/bites Postural Changes and/or Swallow Maneuvers: Seated upright 90 degrees    Other  Recommendations Oral Care Recommendations: Oral care BID   Follow Up Recommendations  None    Frequency and Duration        Pertinent Vitals/Pain No complaints        Swallow Study Prior Functional Status       General HPI: 53 yo male with history ischemic cardiomyopathy, cirrhosis, DM, aspiration pna, perforation large intestine 2012, cholelithiasis admitted after cardiac arrest at work with EMS trained bystander CPR/defib.  Approx 10 minutes CPR.  Intubated 10/15-10/19.  CXR Stable support apparatus, No change in aeration to the left base.  RN repeorts significant nausea since extubation. Type of Study: Bedside swallow evaluation Previous Swallow Assessment: BSE 10/07/14 s/p extubation Diet Prior to this Study: Dysphagia 3 (soft);Thin liquids Temperature Spikes Noted: No Respiratory Status: Room air History of Recent Intubation: Yes Length of Intubations (days): 5 days Date extubated: 10/06/14 Behavior/Cognition: Alert;Cooperative;Pleasant mood Oral Cavity - Dentition: Dentures, top;Dentures, bottom Self-Feeding Abilities: Able to feed self Patient Positioning: Upright in bed Baseline Vocal Quality: Clear Volitional Cough: Strong Volitional Swallow: Able to elicit    Oral/Motor/Sensory Function Overall Oral Motor/Sensory Function: Appears within functional limits for tasks assessed   Ice Chips     Thin Liquid      Nectar Thick     Honey Thick     Puree     Solid   GO    Solid: Within functional limits Presentation: Self [name], [name] T 10/14/2014,11:04 AM

## 2014-10-14 NOTE — Progress Notes (Signed)
Progress Note  [name] MHW:[id number] DOB: November 13, 1961 DOA: 10/02/2014 PCP: [name], MD  Brief Narrative: 53yo WM PMHx non - ischemic cardiomyopathy EF 35-40% (Not on Hm O2), cirrhosis, DM type 2, cholelithiasis presented 10/15 after arrest at work with EMS trained bystander CPR/defib. Pt suddenly collapsed, fell backward and hit his head. AED advised shockable rhythm. Approx 10 minutes CPR with ROSC before EMS arrival. Patient was admitted to [facility] service. He underwent hypothermic protocol, and placed on pressors. Patient was also placed on CRRT due to high potassium. On 10/06/2014 he was extubated. Patient was evaluated by nephrology during this hospitalization. His Acute kidney injury in setting of cardiac arrest has resolved.  By 10/07/2014 CVVH was discontinued. He was given Lasix. On 10/10/2014 he underwent cardiac catheterization which showed severe three-vessel obstructive coronary artery disease, did not undergo percutaneous intervention. He was seen by cardiothoracic surgery but CABG was not considered a good option secondary to comorbidities. Case discussed with cardiology and question of PCI will be reconsidered if the thrombocytopenia improves. PMT met with patient and family.  Subjective: Patient feels well. Denies any shortness of breath. Still with some pain over chest from when he underwent CPR.   Assessment/Plan: Cardiac arrest presumably from ischemic cardiomyopathy - EF 35-40% per echo 07/2014, but 19% per nuclear study  -On 10/10/2014 patient undergoing cardiac catheterization which showed severe three-vessel obstructive coronary artery disease. He did not undergo percutaneous intervention due to low platelets. Coronary artery bypass grafting not a good option given multiple comorbidities. - EP consulted was consulted and might not be a ICD nor a life vest candidate. Cardiac MRI done and as below. Palliative medicine input has been noted.  -Continue IV Lasix.  Carvedilol. Also on Spironolactone. - Possibility of PCI if platelet counts improve. This could happen this admission based on cardiology notes.  Acute respiratory failure, post arrest  - resolved   Apical left pneumothax noted on CXR 10/15, -resolved  -O2 as needed for O2 saturation > 92%   Pulmonary edema/pleural effusion/Pedal Edema -Strict in and out -Daily weight -On IV Lasix.  Hx Liver cirrhosis/?NASH v ETOH  -not felt to be a liver transplant candidate both due to multiple comorbidites  -Dysphagia diet/carbohydrate modified  -Continue Change PPI daily   Thrombocytopenia due to acute illness and cirrhosis (splenomegally?, clumping)  -Count somewhat improved today.  Anemia - stable - no clear source of bleeding  - PRBC for goal hgb > 8gm%   DM type II -Continue Lantus and SSI  Bradycardia, junctional - resolved with rewarming  Hyperkalemia -resolved   AKI  -Likely secondary to cardiac arrest - Improved  Hx cholelithiasis  -deemed not to be a surgical candidate for cholecystectomy recently.   DVT Prophylaxis: SCD's Code Status: FULL Family Communication: Discussed with patient Disposition Plan: Per cardiology   Consultants: Cardiology, EP, Nephrology, PMT   Procedure/Significant Events: 10/15 hyperkalemia (on aldactone & lisinopril) requiring CRRT  CT head 10/15>>>neg, Small air-fluid level in the left maxillary sinus and right sphenoid sinus  10/03/14 : : Cooled to 33  Bradycardic , on max pressors  Anuric  Sedated , paralysed  On CRRT   10/04/14  On CRRT for high K., on pressors., Just rewarmed: following all commands, received 1 U PRBC   10/18 still on pressors, received 2 U PRBC  10/19 > extubated, off all pressors, following commands  10/20> passed swallow eval, CVVHD off, given lasix made great urine 10/22 CXR Moderate CHF with bilateral pleural effusions and mild  pulmonary vascular congestion.   Culture 10/16 blood > neg    Antibiotics: 10/16 unasyn > 10/19   LINES / TUBES:  CVL R IJ CVL 10/15   Objective: VITAL SIGNS:  BP 142/68  Pulse 73  Temp(Src) 98 F (36.7 C) (Oral)  Resp 17  Ht 5' 10" (1.778 m)  Wt 113.172 kg (249 lb 8 oz)  BMI 35.80 kg/m2  SpO2 95%   Exam: General: A./O x4, NAD,  Lungs: Improved air entry bilaterally. Very few crackles at bases. No wheezing.  Cardiovascular: Regular rate and rhythm without murmur gallop or rub normal S1 and S2. Tender to palpation. Abdomen: Nontender, nondistended, soft, bowel sounds positive, no rebound, no ascites, no appreciable mass Extremities: 2+ pitting edema bilateral LE  Data Reviewed: Basic Metabolic Panel:  Recent Labs Lab 10/07/14 1600 10/08/14 0445 10/09/14 0410 10/10/14 0230 10/11/14 0430 10/11/14 0500 10/12/14 0812 10/13/14 0335 10/14/14 0545  NA 137 139 140 139 139  --   --  140 138  K 4.6 4.5 4.2 4.1 3.8  --   --  4.3 4.4  CL 101 103 104 101 101  --   --  101 99  CO2 25 26 28 29 31  --   --  29 31  GLUCOSE 160* 159* 171* 145* 92  --   --  83 82  BUN 45* 52* 51* 42* 33*  --   --  31* 27*  CREATININE 1.53* 1.62* 1.37* 1.17 1.09  --   --  1.17 1.20  CALCIUM 7.8* 8.1* 8.1* 8.3* 8.3*  --   --  8.5 8.8  MG  --  2.5 2.7* 2.3  --  2.0 1.8  --   --   PHOS 3.0 3.4 3.3 4.3 4.8*  --   --   --   --    Liver Function Tests:  Recent Labs Lab 10/08/14 0445 10/09/14 0410 10/10/14 0230 10/11/14 0430 10/14/14 0545  AST  --   --   --   --  28  ALT  --   --   --   --  16  ALKPHOS  --   --   --   --  132*  BILITOT  --   --   --   --  1.1  PROT  --   --   --   --  5.4*  ALBUMIN 2.4* 2.3* 2.3* 2.3* 2.5*    CBC:  Recent Labs Lab 10/10/14 0200 10/11/14 0500 10/12/14 0812 10/13/14 0335 10/14/14 0545  WBC 4.8 4.1 3.9* 4.0 3.6*  NEUTROABS 3.7 3.1 3.0  --   --   HGB 8.5* 8.4* 9.2* 9.0* 9.3*  HCT 25.2* 25.6* 28.3* 27.4* 28.9*  MCV 90.0 90.8 90.1 90.4 93.2  PLT 58* 58* 58* 50* 65*    CBG:  Recent Labs Lab  10/13/14 2005 10/13/14 2354 10/14/14 0027 10/14/14 0454 10/14/14 0801  GLUCAP 165* 60* 101* 83 104*    No results found for this or any previous visit (from the past 240 hour(s)).   Scheduled Meds:  Scheduled Meds: . sodium chloride   Intravenous Once  . aspirin  81 mg Oral Daily  . atorvastatin  20 mg Oral q1800  . carvedilol  6.25 mg Oral BID WC  . feeding supplement (ENSURE COMPLETE)  237 mL Oral BID BM  . furosemide  40 mg Intravenous Q8H  . insulin aspart  0-20 Units Subcutaneous 6 times per day  . insulin glargine  16   Units Subcutaneous Daily  . lidocaine  1 patch Transdermal Q24H  . lisinopril  5 mg Oral Daily  . pantoprazole  40 mg Oral BID  . sodium chloride  10-40 mL Intracatheter Q12H  . spironolactone  25 mg Oral Daily    Time spent on care of this patient: 66 mins   Bonnielee Haff , MD   [facility] Office  [phone] Pager - [phone]  On-Call/Text Page:      [email or web address]      password [email or web address]  If 7PM-7AM, please contact night-coverage [email or web address] Password [email or web address] 10/14/2014, 10:26 AM   LOS: 12 days

## 2014-10-14 NOTE — Progress Notes (Addendum)
Patient ZO:XWRUEA ULICE CZECH      DOB: 1961/01/05      VWU:[id number]   Palliative Medicine Team at [facility] Progress Note    Subjective: Bejamin looks stronger today.  He had a nose bleed which scared him but it did stop.  He understands that if his platelets continue to improve that he may be eligible for BMs. Patient hopeful for this intervention.  He states tearfully " obviously I still want to live if I can".  Back pain improved with use of lidocaine patch over night.   Filed Vitals:   10/14/14 1429  BP: 115/63  Pulse: 73  Temp: 98.7 F (37.1 C)  Resp: 18   Physical exam:  Defered.   Assessment and plan: 53 yr old with SCD s/p resuscitation course complicated by chronic liver disease and acute on chronic thrombocytopenia.  Platelets are improving but still not great 65 K today.  Cardiology using watchful waiting to see if BMS is an option.  1.  Full code for now. Patient understands that he can elect a DNR at any time.  He feels that he wants to try to come back but would not want to live on vent which he was able to explain to his sister who is likely his closets NOK and will be appointed MPOA.  1. Back Pain: continue lidocaine patch and prn oral meds.  2.  SCD: awaiting word on BMS feasibility  3.  Chronic liver disease not a lot of options was hooked into [facility] as outpt.  Emotional support provided.  Total time 15 min   Shiane Wenberg L. Ladona Ridgel, MD MBA The Palliative Medicine Team at [facility] Phone: [phone] Pager: [phone] ( Use team phone after hours)

## 2014-10-14 NOTE — Progress Notes (Signed)
Chaplain responded to consult from doctor to assist patient with advance directives.  Patient requested that someone come tomorrow after 3 pm when his family is present to assist. I left form with patient for his review.    10/14/14 1400  Clinical Encounter Type  Visited With Patient  Visit Type Initial;Other (Comment)  Referral From Physician  Spiritual Encounters  Spiritual Needs Other (Comment) (Assist with AD)  Stress Factors  Patient Stress Factors Exhausted  Advance Directives (For Healthcare)  Does patient have an advance directive? No  Venida Jarvis, Chaplain,pager [phone]

## 2014-10-14 NOTE — Progress Notes (Addendum)
Patient ID: [name], male   DOB: Sep 27, 1961, 53 y.o.   MRN: [id number]   Patient with history of ETOH cirrhosis and recent episode of biliary sepsis/gallstone pancreatitis in 8/15.  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  Cardiomyopathy with EF 35-40% by echo in 8/15.  Cardiolite 8/15 with inferior and apical scar.   Admitted 10/02/14 after cardiac arrest (likely vfib) terminated by defibrillation x 2 by AED.  Initially underwent hypothermia protocol, has recovered well, now awake/alert, ambulating, etc.  Platelets remain low.    LHC (10/10/14) with 80-90% proximal ramus, 90% mLAD, subtotalled dLAD, 90% D1, 90% dRCA/proxPDA, LVEDP 27. No intervention with low plts.   Cardiac MRI (10/16) showed EF 40% with regional wall motion abnormalities, normal RV size and systolic function.  Delayed enhancement suggested a mixed picture of viability.  The mid-apical anteroseptal wall and true apex did not appear viable.   Patient is doing well today.  He is walking with PT, not much dyspnea.  Still has a lot of lower extremity swelling, he diuresed some yesterday.  Pain with deep breathing due to rib fractures with CPR. Platelets trending up.   Scheduled Meds: . sodium chloride   Intravenous Once  . aspirin  81 mg Oral Daily  . atorvastatin  20 mg Oral q1800  . carvedilol  6.25 mg Oral BID WC  . feeding supplement (ENSURE COMPLETE)  237 mL Oral BID BM  . furosemide  40 mg Intravenous Q8H  . insulin aspart  0-20 Units Subcutaneous 6 times per day  . insulin glargine  16 Units Subcutaneous Daily  . lidocaine  1 patch Transdermal Q24H  . lisinopril  5 mg Oral Daily  . pantoprazole  40 mg Oral BID  . sodium chloride  10-40 mL Intracatheter Q12H  . spironolactone  25 mg Oral Daily   Continuous Infusions: . sodium chloride 10 mL/hr at 10/09/14 0945   PRN Meds:.albuterol, fentaNYL, labetalol, ondansetron (ZOFRAN) IV, sodium chloride, traMADol    Filed Vitals:   10/13/14 1023 10/13/14 1722 10/13/14 2017 10/14/14 0457  BP: 116/57 114/48 120/58 142/68  Pulse:  75 67 73  Temp:  99 F (37.2 C) 98.6 F (37 C) 98 F (36.7 C)  TempSrc:  Oral Oral Oral  Resp:  18 17 17   Height:      Weight:    249 lb 8 oz (113.172 kg)  SpO2:  95% 98% 95%    Intake/Output Summary (Last 24 hours) at 10/14/14 1042 Last data filed at 10/14/14 0900  Gross per 24 hour  Intake    720 ml  Output   2050 ml  Net  -1330 ml    LABS: Basic Metabolic Panel:  Recent Labs  40/98/11 0812 10/13/14 0335 10/14/14 0545  NA  --  140 138  K  --  4.3 4.4  CL  --  101 99  CO2  --  29 31  GLUCOSE  --  83 82  BUN  --  31* 27*  CREATININE  --  1.17 1.20  CALCIUM  --  8.5 8.8  MG 1.8  --   --    Liver Function Tests:  Recent Labs  10/14/14 0545  AST 28  ALT 16  ALKPHOS 132*  BILITOT 1.1  PROT 5.4*  ALBUMIN 2.5*   No results found for this basename: LIPASE, AMYLASE,  in the last 72 hours CBC:  Recent Labs  10/12/14 0812 10/13/14 0335 10/14/14 0545  WBC 3.9* 4.0  3.6*  NEUTROABS 3.0  --   --   HGB 9.2* 9.0* 9.3*  HCT 28.3* 27.4* 28.9*  MCV 90.1 90.4 93.2  PLT 58* 50* 65*   Cardiac Enzymes: No results found for this basename: CKTOTAL, CKMB, CKMBINDEX, TROPONINI,  in the last 72 hours BNP: No components found with this basename: POCBNP,  D-Dimer: No results found for this basename: DDIMER,  in the last 72 hours Hemoglobin A1C: No results found for this basename: HGBA1C,  in the last 72 hours Fasting Lipid Panel: No results found for this basename: CHOL, HDL, LDLCALC, TRIG, CHOLHDL, LDLDIRECT,  in the last 72 hours Thyroid Function Tests: No results found for this basename: TSH, T4TOTAL, FREET3, T3FREE, THYROIDAB,  in the last 72 hours Anemia Panel: No results found for this basename: VITAMINB12, FOLATE, FERRITIN, TIBC, IRON, RETICCTPCT,  in the last 72 hours  RADIOLOGY: Ct Head Wo Contrast  10/02/2014   CLINICAL DATA:  53 year old male with history  of trauma after after a fall during a syncopal episode during which the patient fell backward and struck his head. Status post CPR.  EXAM: CT HEAD WITHOUT CONTRAST  TECHNIQUE: Contiguous axial images were obtained from the base of the skull through the vertex without intravenous contrast.  COMPARISON:  No priors.  FINDINGS: No acute displaced skull fractures are identified. No acute intracranial abnormality. Specifically, no evidence of acute post-traumatic intracranial hemorrhage, no definite regions of acute/subacute cerebral ischemia, no focal mass, mass effect, hydrocephalus or abnormal intra or extra-axial fluid collections. The visualized paranasal sinuses and mastoids are well pneumatized, with exception of a small air-fluid level in the posterior aspect of the left maxillary sinus and right sphenoid sinus.  IMPRESSION: 1. Small amount of soft tissue swelling in the occipital scalp, without underlying displaced skull fracture or acute intracranial abnormalities. 2. Small air-fluid level in the left maxillary sinus and right sphenoid sinus. This is nonspecific, but correlation for signs of sinusitis is recommended.   Electronically Signed   By: Trudie Reed M.D.   On: 10/02/2014 16:36   PHYSICAL EXAM General: NAD Neck: Thick, JVP difficult, no thyromegaly or thyroid nodule.  Lungs: Decreased breath sounds at bases bilaterally.  CV: Nondisplaced PMI.  Heart regular S1/S2, no S3/S4, 2/6 early SEM RUSB.  2+ edema to knees bilaterally.   Abdomen: Soft, nontender, no hepatosplenomegaly, mild distention.  Neurologic: Alert and oriented x 3.  Psych: Normal affect. Extremities: No clubbing or cyanosis.   TELEMETRY: Reviewed telemetry pt in NSR  ASSESSMENT AND PLAN: 53 yo with history of ETOH cirrhosis, gallstone pancreatitis, chronic thrombocytopenia, and cardiomyopathy presented with presumed vfib arrest s/p defibrillation with AED.  He was cooled and recovered well neurologically.  LHC showed  severe 2 VD, no intervention given thrombocytopenia.  1. Cardiac arrest: Suspect ventricular fibrillation, likely scar-mediated in the setting of ischemic cardiomyopathy (troponin did not go particularly high).  EF 35-40% by echo in 8/15, not repeated yet this admission. Difficult situation: turned down for the time being for ICD given life expectancy estimation < 1 year.  Could consider Lifevest, but if there is no plan for revascularization there would be no end point. As he already has cirrhosis, I would like to avoid amiodarone use.  Will gradually titrate up beta blocker => increase Coreg to 6.25 mg bid today.  2. CAD: See cath report above.  Too high risk for CABG.  Mid LAD and distal RCA/proximal PDA are potential targets for PCI.  However, there are two issues with  this.  There may not be viable tissue, and thrombocytopenia will make use of DAPT difficult. I did a cardiac MRI yesterday (see above) which showed a mixed picture for viability.  The mid to apical anteroseptal wall and the true apex probably are not viable.  Platelets are heading up.  -  If we could keep plts in the 70+ range, could consider PCI with BMS.  Will see what plts look like tomorrow (up to 65K today) and will review films with interventional colleagues.   - He is now back on ASA 81.   - Added low dose statin given stable LFTs (atorvastatin 20).  3. Acute on chronic systolic CHF: EF 91-47%35-40% in 8/15.  EF 40% by cardiac MRI 10/26. He remains volume overloaded on exam.  - Coreg increased to 6.25 mg bid and spironolactone to 25 daily.  Continue lisinopril.  - Increase IV lasix to q8 hrs  4. Cirrhosis: Related to prior heavy ETOH.  He has now quit drinking.  Transaminases have only been mildly elevated and are normal today.  5. Chronic thrombocytopenia: Likely due to splenomegaly in the setting of cirrhosis.  Also possible ETOH-related marrow suppression.  See discussion above.  Will need to follow plts over time to see if PCI will  be an option (rising now).  6. Bicuspid aortic valve: Mild AS.  7. CKD: Creatinine stable.  8. Type II diabetes: Continue insulin.  9. H/o biliary pancreatitis and sepsis: Still has gallbladder, was deemed too high risk for surgery in 8/15.  10. Ambulated with PT  Marca AnconaDalton Jewels Langone 10/14/2014 10:42 AM  Discussed with Dr. Excell Seltzerooper.  I am going to give Mr Petra KubaVogel a bolus of Plavix 300 mg x 1 and get P2Y12 in am.  If he responds, we could envision doing PCI to LAD and RCA and leaving him on single agent Plavix for antiplatelet treatment.  Will reassess plts and P2Y12 in am, possible PCI tomorrow.  Keep NPO.   Marca AnconaDalton Cailynn Bodnar 10/14/2014 5:28 PM

## 2014-10-14 NOTE — Progress Notes (Signed)
Physical Therapy Treatment Patient Details Name: Ralph BuckerRobert A Walker MRN: [id number] DOB: October 27, 1961 Today's Date: 10/14/2014    History of Present Illness Pt adm with cardiac arrest. Underwent artic sun. Pt with resp failure and intubated and acute kidney injury. 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 Not operative candidate. May need life vest. PMH - ischemic cardiomyopathy EF 35-40%, cirrhosis, DM, lt toe amputation, peripheral neuropathy.    PT Comments    Patient is progressing well towards physical therapy goals. Able to safely complete stair training this AM and tolerating therapeutic exercises well. Ambulates up to 225 feet with supervision and use of a rolling Walker. HR low 80s at rest and low 90s while ambulating with SpO2 90-92% while on room air throughout therapy session. Will continue to follow acutely.   Follow Up Recommendations  Home health PT;Supervision/Assistance - 24 hour     Equipment Recommendations  3in1 (PT)    Recommendations for Other Services OT consult     Precautions / Restrictions Precautions Precautions: Fall Restrictions Weight Bearing Restrictions: No    Mobility  Bed Mobility Overal bed mobility: Needs Assistance Bed Mobility: Supine to Sit;Sit to Supine;Rolling Rolling: Supervision   Supine to sit: Supervision Sit to supine: Supervision   General bed mobility comments: No physical assist needed. Educated on log roll technique for back comfort and to protect ribs. Pt reports improved symptoms when performing this technique.  Transfers Overall transfer level: Needs assistance Equipment used: Rolling Walker (2 wheeled) Transfers: Sit to/from Stand Sit to Stand: Supervision         General transfer comment: Supervision for safety. Performed from recliner and lowest bed setting. No physical assist needed, improved control with descent today.  Ambulation/Gait Ambulation/Gait assistance: Supervision Ambulation Distance  (Feet): 250 Feet Assistive device: Rolling Walker (2 wheeled) Gait Pattern/deviations: Step-through pattern;Decreased dorsiflexion - right Gait velocity: decreased   General Gait Details: No loss of balance noted, instructed to continue with pursed lip breathing during ambulation and to rest if feeling short of breath. SpO2 maintained 90% while on room air. Possible foot drop on Rt difficult to assess due to large socks worn today. If so, demonstrates good clearance of Rt foot.  HR maintained low 90s high 80s throughout bout.   Stairs Stairs: Yes Stairs assistance: Min guard Stair Management: One rail Right;Step to pattern;Forwards;Sideways Number of Stairs: 1 (x3) General stair comments: Pt with decreased RLE strength, unable to ascend leading with this foot. He was able to step up with Lt foot with use of rail on Rt similar to home environment. Also educated on sideways technique holding onto rail with both hands and was able to safely complete this task. Min guard for safety. Verbalizes understanding and has no further questions  Wheelchair Mobility    Modified Rankin (Stroke Patients Only)       Balance                                    Cognition Arousal/Alertness: Awake/alert Behavior During Therapy: WFL for tasks assessed/performed Overall Cognitive Status: Within Functional Limits for tasks assessed                      Exercises Other Exercises Other Exercises: Supine, glute max stretch using sheet to pull single knee to chest. x 1min each leg  Other Exercises: Supine, piriformis stretch x 1min each leg Other Exercises: supine, hooklying position  allowing knees to fall to side for low back stretch. x3/30 seconds each side    General Comments General comments (skin integrity, edema, etc.): Reviewed exercises, positioning and stretches due to intermittent low back pain from previous injury.      Pertinent Vitals/Pain Pain Assessment: No/denies  pain Pain Location:  (Only when I try to stand, my ribs hurt) Pain Intervention(s): Monitored during session    Home Living                      Prior Function            PT Goals (current goals can now be found in the care plan section) Acute Rehab PT Goals PT Goal Formulation: With patient Time For Goal Achievement: 10/16/14 Potential to Achieve Goals: Good Progress towards PT goals: Progressing toward goals    Frequency  Min 3X/week    PT Plan Current plan remains appropriate    Co-evaluation             End of Session Equipment Utilized During Treatment: Gait belt Activity Tolerance: Patient tolerated treatment well Patient left: in chair;with call bell/phone within reach     Time: 0855-0925 PT Time Calculation (min): 30 min  Charges:  $Gait Training: 8-22 mins $Therapeutic Exercise: 8-22 mins                    G Codes:      [employer],  [phone]  Berton Mount 10/14/2014, 10:13 AM

## 2014-10-15 ENCOUNTER — Encounter (HOSPITAL_COMMUNITY)
Admission: EM | Disposition: A | Discharge status: Home Health | Payer: 59 | Source: Home / Self Care | Attending: Pulmonary Disease

## 2014-10-15 DIAGNOSIS — I2510 Atherosclerotic heart disease of native coronary artery without angina pectoris: Secondary | ICD-10-CM

## 2014-10-15 HISTORY — PX: LEFT HEART CATHETERIZATION WITH CORONARY ANGIOGRAM: SHX5451

## 2014-10-15 LAB — BASIC METABOLIC PANEL
ANION GAP: 10 (ref 5–15)
BUN: 29 mg/dL — AB (ref 6–23)
CALCIUM: 8.6 mg/dL (ref 8.4–10.5)
CHLORIDE: 100 meq/L (ref 96–112)
CO2: 30 meq/L (ref 19–32)
CREATININE: 1.24 mg/dL (ref 0.50–1.35)
GFR calc Af Amer: 76 mL/min — ABNORMAL LOW (ref >90)
GFR calc non Af Amer: 65 mL/min — ABNORMAL LOW (ref >90)
Glucose, Bld: 115 mg/dL — ABNORMAL HIGH (ref 70–99)
Potassium: 4.3 mEq/L (ref 3.7–5.3)
Sodium: 140 mEq/L (ref 137–147)

## 2014-10-15 LAB — CBC
HEMATOCRIT: 27.8 % — AB (ref 39.0–52.0)
Hemoglobin: 8.9 g/dL — ABNORMAL LOW (ref 13.0–17.0)
MCH: 29.7 pg (ref 26.0–34.0)
MCHC: 32 g/dL (ref 30.0–36.0)
MCV: 92.7 fL (ref 78.0–100.0)
Platelets: 61 10*3/uL — ABNORMAL LOW (ref 150–400)
RBC: 3 MIL/uL — ABNORMAL LOW (ref 4.22–5.81)
RDW: 16.8 % — ABNORMAL HIGH (ref 11.5–15.5)
WBC: 3.9 10*3/uL — ABNORMAL LOW (ref 4.0–10.5)

## 2014-10-15 LAB — GLUCOSE, CAPILLARY
GLUCOSE-CAPILLARY: 178 mg/dL — AB (ref 70–99)
Glucose-Capillary: 134 mg/dL — ABNORMAL HIGH (ref 70–99)
Glucose-Capillary: 95 mg/dL (ref 70–99)
Glucose-Capillary: 107 mg/dL — ABNORMAL HIGH (ref 70–99)
Glucose-Capillary: 174 mg/dL — ABNORMAL HIGH (ref 70–99)
Glucose-Capillary: 190 mg/dL — ABNORMAL HIGH (ref 70–99)

## 2014-10-15 LAB — POCT ACTIVATED CLOTTING TIME: ACTIVATED CLOTTING TIME: 388 s

## 2014-10-15 LAB — PLATELET INHIBITION P2Y12: Platelet Function  P2Y12: 270 [PRU] (ref 194–418)

## 2014-10-15 SURGERY — LEFT HEART CATHETERIZATION WITH CORONARY ANGIOGRAM
Anesthesia: LOCAL

## 2014-10-15 MED ORDER — SODIUM CHLORIDE 0.9 % IV SOLN
0.2500 mg/kg/h | INTRAVENOUS | Status: DC
  Filled 2014-10-15: qty 250

## 2014-10-15 MED ORDER — NITROGLYCERIN 1 MG/10 ML FOR IR/CATH LAB
INTRA_ARTERIAL | Status: AC
  Filled 2014-10-15: qty 10

## 2014-10-15 MED ORDER — MIDAZOLAM HCL 2 MG/2ML IJ SOLN
INTRAMUSCULAR | Status: AC
  Filled 2014-10-15: qty 2

## 2014-10-15 MED ORDER — SODIUM CHLORIDE 0.9 % IV SOLN: INTRAVENOUS | Status: AC

## 2014-10-15 MED ORDER — FENTANYL CITRATE 0.05 MG/ML IJ SOLN
INTRAMUSCULAR | Status: AC
  Filled 2014-10-15: qty 2

## 2014-10-15 MED ORDER — BIVALIRUDIN 250 MG IV SOLR
INTRAVENOUS | Status: AC
  Filled 2014-10-15: qty 250

## 2014-10-15 MED ORDER — INSULIN ASPART 100 UNIT/ML ~~LOC~~ SOLN
0.0000 [IU] | Freq: Every day | SUBCUTANEOUS | Status: DC
Start: 2014-10-15 — End: 2014-10-18

## 2014-10-15 MED ORDER — LIDOCAINE HCL (PF) 1 % IJ SOLN
INTRAMUSCULAR | Status: AC
  Filled 2014-10-15: qty 30

## 2014-10-15 MED ORDER — INSULIN ASPART 100 UNIT/ML ~~LOC~~ SOLN
0.0000 [IU] | Freq: Three times a day (TID) | SUBCUTANEOUS | Status: DC
  Administered 2014-10-16: 08:00:00 3 [IU] via SUBCUTANEOUS
  Administered 2014-10-16 – 2014-10-17 (×4): 4 [IU] via SUBCUTANEOUS

## 2014-10-15 MED ORDER — VERAPAMIL HCL 2.5 MG/ML IV SOLN
INTRAVENOUS | Status: AC
  Filled 2014-10-15: qty 2

## 2014-10-15 MED ORDER — HEPARIN SODIUM (PORCINE) 1000 UNIT/ML IJ SOLN
INTRAMUSCULAR | Status: AC
  Filled 2014-10-15: qty 1

## 2014-10-15 MED ORDER — INSULIN ASPART 100 UNIT/ML ~~LOC~~ SOLN: 0.0000 [IU] | Freq: Three times a day (TID) | SUBCUTANEOUS | Status: DC

## 2014-10-15 MED ORDER — CLOPIDOGREL BISULFATE 75 MG PO TABS: 75.0000 mg | ORAL_TABLET | Freq: Every day | ORAL | Status: DC

## 2014-10-15 MED ORDER — HEPARIN (PORCINE) IN NACL 2-0.9 UNIT/ML-% IJ SOLN
INTRAMUSCULAR | Status: AC
  Filled 2014-10-15: qty 1500

## 2014-10-15 MED ORDER — CLOPIDOGREL BISULFATE 75 MG PO TABS
75.0000 mg | ORAL_TABLET | Freq: Every day | ORAL | Status: DC
  Administered 2014-10-15 – 2014-10-18 (×4): 75 mg via ORAL
  Filled 2014-10-15 (×4): qty 1

## 2014-10-15 NOTE — Progress Notes (Signed)
   10/15/14 1700  Clinical Encounter Type  Visited With Patient and family together  Visit Type Follow-up;Spiritual support;Social support  Referral From Physician  Consult/Referral To Social work  Recommendations (Patient would like to see a Child psychotherapist)  Spiritual Encounters  Spiritual Needs Emotional  Stress Factors  Patient Stress Factors Health changes  Family Stress Factors Major life changes   Chaplain visited with patient and one of the patient's daughters. Chaplain was notified by another chaplain that the patient wanted a chaplain to come by after 3PM today to discuss an advanced directive. Chaplain went through the advanced directive form with the patient and the patient's daughter, explaining each portion of the document. Patient's daughter admitted that this was a tough conversation to have. Patient's daughter also mentioned that they would like to visit with a social worker to discuss the patient's insurance situation. Patient and patient's daughter are still reflecting on the advanced directive and will notify a nurse when they are ready for the document to be signed. Chaplain will follow up with patient at a later time. Cranston Neighbor, Chaplain 5:17 PM

## 2014-10-15 NOTE — H&P (View-Only) (Signed)
Patient ID: [name], male   DOB: 08/14/1961, 53 y.o.   MRN: [id number]   Patient with history of ETOH cirrhosis and recent episode of biliary sepsis/gallstone pancreatitis in 8/15.  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  Cardiomyopathy with EF 35-40% by echo in 8/15.  Cardiolite 8/15 with inferior and apical scar.   Admitted 10/02/14 after cardiac arrest (likely vfib) terminated by defibrillation x 2 by AED.  Initially underwent hypothermia protocol, has recovered well, now awake/alert, ambulating, etc.  Platelets remain low.    LHC (10/10/14) with 80-90% proximal ramus, 90% mLAD, subtotalled dLAD, 90% D1, 90% dRCA/proxPDA, LVEDP 27. No intervention with low plts.   Cardiac MRI (10/26) showed EF 40% with regional wall motion abnormalities, normal RV size and systolic function.  Delayed enhancement suggested a mixed picture of viability.  The mid-apical anteroseptal wall and true apex did not appear viable.   Patient is doing well today.  He is walking with PT, not much dyspnea.  Still has a lot of lower extremity swelling, he diuresed well yesterday.  Pain with deep breathing due to rib fractures with CPR. Platelets have trended up.   Scheduled Meds: . sodium chloride   Intravenous Once  . aspirin  81 mg Oral Daily  . atorvastatin  20 mg Oral q1800  . carvedilol  6.25 mg Oral BID WC  . clopidogrel  75 mg Oral Daily  . feeding supplement (ENSURE COMPLETE)  237 mL Oral BID BM  . furosemide  40 mg Intravenous Q8H  . insulin aspart  0-20 Units Subcutaneous 6 times per day  . insulin glargine  16 Units Subcutaneous Daily  . lidocaine  1 patch Transdermal Q24H  . lisinopril  5 mg Oral Daily  . pantoprazole  40 mg Oral BID  . sodium chloride  10-40 mL Intracatheter Q12H  . spironolactone  25 mg Oral Daily   Continuous Infusions: . sodium chloride 10 mL/hr at 10/09/14 0945   PRN Meds:.albuterol, fentaNYL, labetalol, ondansetron (ZOFRAN) IV, sodium  chloride, traMADol    Filed Vitals:   10/14/14 1429 10/14/14 2014 10/15/14 0416 10/15/14 0419  BP: 115/63 116/47  137/67  Pulse: 73 67  78  Temp: 98.7 F (37.1 C) 98.3 F (36.8 C)  98.5 F (36.9 C)  TempSrc: Oral Oral  Oral  Resp: 18 18  18  Height:      Weight:   244 lb 14.9 oz (111.1 kg)   SpO2: 96% 96%  96%    Intake/Output Summary (Last 24 hours) at 10/15/14 0808 Last data filed at 10/15/14 0649  Gross per 24 hour  Intake    960 ml  Output   3550 ml  Net  -2590 ml    LABS: Basic Metabolic Panel:  Recent Labs  10/12/14 0812  10/14/14 0545 10/15/14 0552  NA  --   < > 138 140  K  --   < > 4.4 4.3  CL  --   < > 99 100  CO2  --   < > 31 30  GLUCOSE  --   < > 82 115*  BUN  --   < > 27* 29*  CREATININE  --   < > 1.20 1.24  CALCIUM  --   < > 8.8 8.6  MG 1.8  --   --   --   < > = values in this interval not displayed. Liver Function Tests:  Recent Labs  10/14/14 0545  AST 28    ALT 16  ALKPHOS 132*  BILITOT 1.1  PROT 5.4*  ALBUMIN 2.5*   No results found for this basename: LIPASE, AMYLASE,  in the last 72 hours CBC:  Recent Labs  10/12/14 0812  10/14/14 0545 10/15/14 0552  WBC 3.9*  < > 3.6* 3.9*  NEUTROABS 3.0  --   --   --   HGB 9.2*  < > 9.3* 8.9*  HCT 28.3*  < > 28.9* 27.8*  MCV 90.1  < > 93.2 92.7  PLT 58*  < > 65* 61*  < > = values in this interval not displayed. Cardiac Enzymes: No results found for this basename: CKTOTAL, CKMB, CKMBINDEX, TROPONINI,  in the last 72 hours BNP: No components found with this basename: POCBNP,  D-Dimer: No results found for this basename: DDIMER,  in the last 72 hours Hemoglobin A1C: No results found for this basename: HGBA1C,  in the last 72 hours Fasting Lipid Panel: No results found for this basename: CHOL, HDL, LDLCALC, TRIG, CHOLHDL, LDLDIRECT,  in the last 72 hours Thyroid Function Tests: No results found for this basename: TSH, T4TOTAL, FREET3, T3FREE, THYROIDAB,  in the last 72 hours Anemia  Panel: No results found for this basename: VITAMINB12, FOLATE, FERRITIN, TIBC, IRON, RETICCTPCT,  in the last 72 hours  RADIOLOGY: Ct Head Wo Contrast  10/02/2014   CLINICAL DATA:  53-year-old male with history of trauma after after a fall during a syncopal episode during which the patient fell backward and struck his head. Status post CPR.  EXAM: CT HEAD WITHOUT CONTRAST  TECHNIQUE: Contiguous axial images were obtained from the base of the skull through the vertex without intravenous contrast.  COMPARISON:  No priors.  FINDINGS: No acute displaced skull fractures are identified. No acute intracranial abnormality. Specifically, no evidence of acute post-traumatic intracranial hemorrhage, no definite regions of acute/subacute cerebral ischemia, no focal mass, mass effect, hydrocephalus or abnormal intra or extra-axial fluid collections. The visualized paranasal sinuses and mastoids are well pneumatized, with exception of a small air-fluid level in the posterior aspect of the left maxillary sinus and right sphenoid sinus.  IMPRESSION: 1. Small amount of soft tissue swelling in the occipital scalp, without underlying displaced skull fracture or acute intracranial abnormalities. 2. Small air-fluid level in the left maxillary sinus and right sphenoid sinus. This is nonspecific, but correlation for signs of sinusitis is recommended.   Electronically Signed   By: Daniel  Entrikin M.D.   On: 10/02/2014 16:36   PHYSICAL EXAM General: NAD Neck: Thick, JVP 8-9 cm, no thyromegaly or thyroid nodule.  Lungs: Decreased breath sounds at bases bilaterally.  CV: Nondisplaced PMI.  Heart regular S1/S2, no S3/S4, 2/6 early SEM RUSB.  2+ edema 1/2 to knees bilaterally.   Abdomen: Soft, nontender, no hepatosplenomegaly, mild distention.  Neurologic: Alert and oriented x 3.  Psych: Normal affect. Extremities: No clubbing or cyanosis.   TELEMETRY: Reviewed telemetry pt in NSR  ASSESSMENT AND PLAN: 53 yo with history  of ETOH cirrhosis, gallstone pancreatitis, chronic thrombocytopenia, and cardiomyopathy presented with presumed vfib arrest s/p defibrillation with AED.  He was cooled and recovered well neurologically.  LHC showed severe 2 VD, no intervention given thrombocytopenia.  1. Cardiac arrest: Suspect ventricular fibrillation, likely scar-mediated in the setting of ischemic cardiomyopathy (troponin did not go particularly high).  EF 35-40% by echo in 8/15, not repeated yet this admission. Difficult situation: turned down for the time being for ICD given life expectancy estimation < 1 year.  Could consider   Lifevest, but if there is no plan for revascularization there would be no end point. As he already has cirrhosis, I would like to avoid amiodarone use.  Will gradually titrate up beta blocker. See below, now that platelets have stabilized, think PCI LAD and RCA are options and may be done today.  2. CAD: See cath report above.  Too high risk for CABG.  Mid LAD and distal RCA/proximal PDA are potential targets for PCI.  However, there are two issues with this.  There may not be viable tissue, and thrombocytopenia will make use of DAPT difficult. I did a cardiac MRI (see above) which showed a mixed picture for viability.  The mid to apical anteroseptal wall and the true apex probably are not viable.  Platelets have now stabilized, 61K today.   - I reviewed films yesterday with Dr Cooper.  Now that platelets stabilized, our best option to avoid recurrent cardiac arrest/improve cardiac function is probably going to be PCI to RCA/LAD.  Possibly today.  - He is now back on ASA 81 and has been given Plavix, PRU 270.  Possible PCI today to RCA and LAD.  If PRU < 230 eventually on Plavix, could discontinue ASA and treat only with single agent Plavix.   - Added low dose statin given stable LFTs (atorvastatin 20).  3. Acute on chronic systolic CHF: EF 35-40% in 8/15.  EF 40% by cardiac MRI 10/26. He remains volume overloaded  on exam but improved.  - Coreg increased to 6.25 mg bid and spironolactone to 25 daily.  Continue lisinopril.  - Hold am Lasix for possible PCI.  4. Cirrhosis: Related to prior heavy ETOH.  He has now quit drinking.  Transaminases have only been mildly elevated and were normal when last checked.  5. Chronic thrombocytopenia: Likely due to splenomegaly in the setting of cirrhosis.  Also possible ETOH-related marrow suppression.  See discussion above.  Platelets seem to have stabilized. 6. Bicuspid aortic valve: Mild AS.  7. CKD: Creatinine stable.  8. Type II diabetes: Continue insulin.  9. H/o biliary pancreatitis and sepsis: Still has gallbladder, was deemed too high risk for surgery in 8/15.  10. Ambulated with PT  Rosangelica Pevehouse 10/15/2014 8:08 AM   

## 2014-10-15 NOTE — CV Procedure (Signed)
   CARDIAC CATH NOTE  Name: [name] MRN: [id number] DOB: 01/20/1961  Procedure: PTCA and stenting of the mid LAD and distal RCA into the PDA  Indication: Non-ST elevation myocardial infarction with recent cardiac arrest  Medications:  Sedation:  0.5 mg IV Versed, 25 mcg IV Fentanyl  Contrast:  170 mL Omnipaque  Procedural Details: The right wrist was prepped, draped, and anesthetized with 1% lidocaine. Using the modified Seldinger technique, a 6 Fr sheath was introduced into the radial artery. 3 mg verapamil was administered through the radial sheath. Weight-based bivalirudin was given for anticoagulation. Once a therapeutic ACT was achieved, a 6 [place] Ikari left 3.5 guide catheter was inserted into the left main.  A Runthrough coronary guidewire was used to cross the lesion in mid LAD.  The lesion was predilated with a 2.5 X12 mm balloon.  The lesion was then stented with a 3.0 X 15 mm Xience Albine drug eluting stent.  The stent was postdilated with a 3.25 X 12 mm noncompliant balloon.  Following PCI, there was 0% residual stenosis and TIMI-3 flow. Final angiography confirmed an excellent result.  A 6 [place] JR 4 guide catheter was inserted into the RCA.  A Runthrough coronary guidewire was used to cross the lesion .  The lesion was predilated with a 2.0 X 20  mm balloon.  The lesion was then stented with a 2.5 X 33 mm Xience Albine drug eluting stent. This stent extended into the proximal PDA.  The stent was postdilated with a 2.75 X 20 mm noncompliant balloon.  Following PCI, there was 0% residual stenosis and TIMI-3 flow. Final angiography confirmed an excellent result. The patient tolerated the procedure well. There were no immediate procedural complications. A TR band was used for radial hemostasis. The patient was transferred to the post catheterization recovery area for further monitoring.  PCI Data: Vessel - LAD/Segment - mid Percent Stenosis (pre)  99%  TIMI-flow :2  Stent :  3.0 X 15 mm Xience Albine drug eluting stent Percent Stenosis (post) :0%  TIMI-flow (post) : 3  Vessel - RCA/Segment - distal into RPDA Percent Stenosis (pre)  90%  TIMI-flow :3 Stent :  2.5 X 33 mm Xience Albine drug eluting stent Percent Stenosis (post) :0%  TIMI-flow (post) : 3  Final Conclusions:  Successful angioplasty and drug-eluting stent placement to both mid LAD and distal RCA into the right PDA.  Recommendations:  Dual antiplatelet therapy for one year.  Lorine Bears MD, [facility] 10/15/2014, 12:26 PM

## 2014-10-15 NOTE — Interval H&P Note (Signed)
Cath Lab Visit (complete for each Cath Lab visit)  Clinical Evaluation Leading to the Procedure:   ACS: Yes.    Non-ACS:    Anginal Classification: CCS IV  Anti-ischemic medical therapy: Minimal Therapy (1 class of medications)  Non-Invasive Test Results: No non-invasive testing performed  Prior CABG: No previous CABG      History and Physical Interval Note:  10/15/2014 11:20 AM  [name]  has presented today for surgery, with the diagnosis of cad  The various methods of treatment have been discussed with the patient and family. After consideration of risks, benefits and other options for treatment, the patient has consented to  Procedure(s): LEFT HEART CATHETERIZATION WITH CORONARY ANGIOGRAM (N/A) as a surgical intervention .  The patient's history has been reviewed, patient examined, no change in status, stable for surgery.  I have reviewed the patient's chart and labs.  Questions were answered to the patient's satisfaction.     Lorine Bears

## 2014-10-15 NOTE — Progress Notes (Signed)
TR BAND REMOVAL  LOCATION:    right radial  DEFLATED PER PROTOCOL:    Yes.    TIME BAND OFF / DRESSING APPLIED:    1545   SITE UPON ARRIVAL:    Level 0  SITE AFTER BAND REMOVAL:    Level 0  REVERSE ALLEN'S TEST:     positive  CIRCULATION SENSATION AND MOVEMENT:    Within Normal Limits   Yes.    COMMENTS:   Tolerated procedure well 

## 2014-10-15 NOTE — Progress Notes (Signed)
Patient ID: [name], male   DOB: 01-Jun-1961, 53 y.o.   MRN: [id number]   Patient with history of ETOH cirrhosis and recent episode of biliary sepsis/gallstone pancreatitis in 8/15.  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  Cardiomyopathy with EF 35-40% by echo in 8/15.  Cardiolite 8/15 with inferior and apical scar.   Admitted 10/02/14 after cardiac arrest (likely vfib) terminated by defibrillation x 2 by AED.  Initially underwent hypothermia protocol, has recovered well, now awake/alert, ambulating, etc.  Platelets remain low.    LHC (10/10/14) with 80-90% proximal ramus, 90% mLAD, subtotalled dLAD, 90% D1, 90% dRCA/proxPDA, LVEDP 27. No intervention with low plts.   Cardiac MRI (10/26) showed EF 40% with regional wall motion abnormalities, normal RV size and systolic function.  Delayed enhancement suggested a mixed picture of viability.  The mid-apical anteroseptal wall and true apex did not appear viable.   Patient is doing well today.  He is walking with PT, not much dyspnea.  Still has a lot of lower extremity swelling, he diuresed well yesterday.  Pain with deep breathing due to rib fractures with CPR. Platelets have trended up.   Scheduled Meds: . sodium chloride   Intravenous Once  . aspirin  81 mg Oral Daily  . atorvastatin  20 mg Oral q1800  . carvedilol  6.25 mg Oral BID WC  . clopidogrel  75 mg Oral Daily  . feeding supplement (ENSURE COMPLETE)  237 mL Oral BID BM  . furosemide  40 mg Intravenous Q8H  . insulin aspart  0-20 Units Subcutaneous 6 times per day  . insulin glargine  16 Units Subcutaneous Daily  . lidocaine  1 patch Transdermal Q24H  . lisinopril  5 mg Oral Daily  . pantoprazole  40 mg Oral BID  . sodium chloride  10-40 mL Intracatheter Q12H  . spironolactone  25 mg Oral Daily   Continuous Infusions: . sodium chloride 10 mL/hr at 10/09/14 0945   PRN Meds:.albuterol, fentaNYL, labetalol, ondansetron (ZOFRAN) IV, sodium  chloride, traMADol    Filed Vitals:   10/14/14 1429 10/14/14 2014 10/15/14 0416 10/15/14 0419  BP: 115/63 116/47  137/67  Pulse: 73 67  78  Temp: 98.7 F (37.1 C) 98.3 F (36.8 C)  98.5 F (36.9 C)  TempSrc: Oral Oral  Oral  Resp: 18 18  18   Height:      Weight:   244 lb 14.9 oz (111.1 kg)   SpO2: 96% 96%  96%    Intake/Output Summary (Last 24 hours) at 10/15/14 0808 Last data filed at 10/15/14 0649  Gross per 24 hour  Intake    960 ml  Output   3550 ml  Net  -2590 ml    LABS: Basic Metabolic Panel:  Recent Labs  40/98/11 0812  10/14/14 0545 10/15/14 0552  NA  --   < > 138 140  K  --   < > 4.4 4.3  CL  --   < > 99 100  CO2  --   < > 31 30  GLUCOSE  --   < > 82 115*  BUN  --   < > 27* 29*  CREATININE  --   < > 1.20 1.24  CALCIUM  --   < > 8.8 8.6  MG 1.8  --   --   --   < > = values in this interval not displayed. Liver Function Tests:  Recent Labs  10/14/14 0545  AST 28  ALT 16  ALKPHOS 132*  BILITOT 1.1  PROT 5.4*  ALBUMIN 2.5*   No results found for this basename: LIPASE, AMYLASE,  in the last 72 hours CBC:  Recent Labs  10/12/14 0812  10/14/14 0545 10/15/14 0552  WBC 3.9*  < > 3.6* 3.9*  NEUTROABS 3.0  --   --   --   HGB 9.2*  < > 9.3* 8.9*  HCT 28.3*  < > 28.9* 27.8*  MCV 90.1  < > 93.2 92.7  PLT 58*  < > 65* 61*  < > = values in this interval not displayed. Cardiac Enzymes: No results found for this basename: CKTOTAL, CKMB, CKMBINDEX, TROPONINI,  in the last 72 hours BNP: No components found with this basename: POCBNP,  D-Dimer: No results found for this basename: DDIMER,  in the last 72 hours Hemoglobin A1C: No results found for this basename: HGBA1C,  in the last 72 hours Fasting Lipid Panel: No results found for this basename: CHOL, HDL, LDLCALC, TRIG, CHOLHDL, LDLDIRECT,  in the last 72 hours Thyroid Function Tests: No results found for this basename: TSH, T4TOTAL, FREET3, T3FREE, THYROIDAB,  in the last 72 hours Anemia  Panel: No results found for this basename: VITAMINB12, FOLATE, FERRITIN, TIBC, IRON, RETICCTPCT,  in the last 72 hours  RADIOLOGY: Ct Head Wo Contrast  10/02/2014   CLINICAL DATA:  53 year old male with history of trauma after after a fall during a syncopal episode during which the patient fell backward and struck his head. Status post CPR.  EXAM: CT HEAD WITHOUT CONTRAST  TECHNIQUE: Contiguous axial images were obtained from the base of the skull through the vertex without intravenous contrast.  COMPARISON:  No priors.  FINDINGS: No acute displaced skull fractures are identified. No acute intracranial abnormality. Specifically, no evidence of acute post-traumatic intracranial hemorrhage, no definite regions of acute/subacute cerebral ischemia, no focal mass, mass effect, hydrocephalus or abnormal intra or extra-axial fluid collections. The visualized paranasal sinuses and mastoids are well pneumatized, with exception of a small air-fluid level in the posterior aspect of the left maxillary sinus and right sphenoid sinus.  IMPRESSION: 1. Small amount of soft tissue swelling in the occipital scalp, without underlying displaced skull fracture or acute intracranial abnormalities. 2. Small air-fluid level in the left maxillary sinus and right sphenoid sinus. This is nonspecific, but correlation for signs of sinusitis is recommended.   Electronically Signed   By: Trudie Reedaniel  Entrikin M.D.   On: 10/02/2014 16:36   PHYSICAL EXAM General: NAD Neck: Thick, JVP 8-9 cm, no thyromegaly or thyroid nodule.  Lungs: Decreased breath sounds at bases bilaterally.  CV: Nondisplaced PMI.  Heart regular S1/S2, no S3/S4, 2/6 early SEM RUSB.  2+ edema 1/2 to knees bilaterally.   Abdomen: Soft, nontender, no hepatosplenomegaly, mild distention.  Neurologic: Alert and oriented x 3.  Psych: Normal affect. Extremities: No clubbing or cyanosis.   TELEMETRY: Reviewed telemetry pt in NSR  ASSESSMENT AND PLAN: 53 yo with history  of ETOH cirrhosis, gallstone pancreatitis, chronic thrombocytopenia, and cardiomyopathy presented with presumed vfib arrest s/p defibrillation with AED.  He was cooled and recovered well neurologically.  LHC showed severe 2 VD, no intervention given thrombocytopenia.  1. Cardiac arrest: Suspect ventricular fibrillation, likely scar-mediated in the setting of ischemic cardiomyopathy (troponin did not go particularly high).  EF 35-40% by echo in 8/15, not repeated yet this admission. Difficult situation: turned down for the time being for ICD given life expectancy estimation < 1 year.  Could consider  Lifevest, but if there is no plan for revascularization there would be no end point. As he already has cirrhosis, I would like to avoid amiodarone use.  Will gradually titrate up beta blocker. See below, now that platelets have stabilized, think PCI LAD and RCA are options and may be done today.  2. CAD: See cath report above.  Too high risk for CABG.  Mid LAD and distal RCA/proximal PDA are potential targets for PCI.  However, there are two issues with this.  There may not be viable tissue, and thrombocytopenia will make use of DAPT difficult. I did a cardiac MRI (see above) which showed a mixed picture for viability.  The mid to apical anteroseptal wall and the true apex probably are not viable.  Platelets have now stabilized, 61K today.   - I reviewed films yesterday with Dr Excell Seltzer.  Now that platelets stabilized, our best option to avoid recurrent cardiac arrest/improve cardiac function is probably going to be PCI to RCA/LAD.  Possibly today.  - He is now back on ASA 81 and has been given Plavix, PRU 270.  Possible PCI today to RCA and LAD.  If PRU < 230 eventually on Plavix, could discontinue ASA and treat only with single agent Plavix.   - Added low dose statin given stable LFTs (atorvastatin 20).  3. Acute on chronic systolic CHF: EF 49-82% in 8/15.  EF 40% by cardiac MRI 10/26. He remains volume overloaded  on exam but improved.  - Coreg increased to 6.25 mg bid and spironolactone to 25 daily.  Continue lisinopril.  - Hold am Lasix for possible PCI.  4. Cirrhosis: Related to prior heavy ETOH.  He has now quit drinking.  Transaminases have only been mildly elevated and were normal when last checked.  5. Chronic thrombocytopenia: Likely due to splenomegaly in the setting of cirrhosis.  Also possible ETOH-related marrow suppression.  See discussion above.  Platelets seem to have stabilized. 6. Bicuspid aortic valve: Mild AS.  7. CKD: Creatinine stable.  8. Type II diabetes: Continue insulin.  9. H/o biliary pancreatitis and sepsis: Still has gallbladder, was deemed too high risk for surgery in 8/15.  10. Ambulated with PT  Marca Ancona 10/15/2014 8:08 AM

## 2014-10-15 NOTE — Progress Notes (Signed)
Progress Note  [name] TIR:[id number] DOB: 1961-04-19 DOA: 10/02/2014 PCP: [name], MD  Brief Narrative: 53yo WM PMHx non - ischemic cardiomyopathy EF 35-40% (Not on Hm O2), cirrhosis, DM type 2, cholelithiasis presented 10/15 after arrest at work with EMS trained bystander CPR/defib. Pt suddenly collapsed, fell backward and hit his head. AED advised shockable rhythm. Approx 10 minutes CPR with ROSC before EMS arrival. Patient was admitted to [facility] service. He underwent hypothermic protocol, and placed on pressors. Patient was also placed on CRRT due to high potassium. On 10/06/2014 he was extubated. Patient was evaluated by nephrology during this hospitalization. His Acute kidney injury in setting of cardiac arrest has resolved.  By 10/07/2014 CVVH was discontinued. He was given Lasix. On 10/10/2014 he underwent cardiac catheterization which showed severe three-vessel obstructive coronary artery disease, did not undergo percutaneous intervention. He was seen by cardiothoracic surgery but CABG was not considered a good option secondary to comorbidities. Case discussed with cardiology and question of PCI will be reconsidered if the thrombocytopenia improves. PMT met with patient and family.  Subjective: comfortable  Assessment/Plan: Cardiac arrest presumably from ischemic cardiomyopathy - EF 35-40% per echo 07/2014, but 19% per nuclear study  -On 10/10/2014 patient undergoing cardiac catheterization which showed severe three-vessel obstructive coronary artery disease. He did not undergo percutaneous intervention due to low platelets. Coronary artery bypass grafting not a good option given multiple comorbidities. - EP consulted was consulted and might not be a ICD nor a life vest candidate. Cardiac MRI done and as below. Palliative medicine input has been noted.  -Continue IV Lasix. Carvedilol. Also on Spironolactone. Plan for cath today.  Acute respiratory failure, post arrest  -  resolved   Apical left pneumothax noted on CXR 10/15, -resolved  -O2 as needed for O2 saturation > 92%   Pulmonary edema/pleural effusion/Pedal Edema -Strict in and out -Daily weight -On IV Lasix.  Hx Liver cirrhosis/?NASH v ETOH  -not felt to be a liver transplant candidate both due to multiple comorbidites  -Dysphagia diet/carbohydrate modified  -Continue Change PPI daily   Thrombocytopenia due to acute illness and cirrhosis (splenomegally?, clumping)  -Count somewhat improved today.  Anemia - stable - no clear source of bleeding  - PRBC for goal hgb > 8gm%   DM type II -Continue Lantus and SSI  Bradycardia, junctional - resolved with rewarming  Hyperkalemia -resolved   AKI  -Likely secondary to cardiac arrest - Improved  Hx cholelithiasis  -deemed not to be a surgical candidate for cholecystectomy recently.   DVT Prophylaxis: SCD's Code Status: FULL Family Communication: Discussed with patient Disposition Plan: Per cardiology   Consultants: Cardiology, EP, Nephrology, PMT   Procedure/Significant Events: 10/15 hyperkalemia (on aldactone & lisinopril) requiring CRRT  CT head 10/15>>>neg, Small air-fluid level in the left maxillary sinus and right sphenoid sinus  10/03/14 : : Cooled to 33  Bradycardic , on max pressors  Anuric  Sedated , paralysed  On CRRT   10/04/14  On CRRT for high K., on pressors., Just rewarmed: following all commands, received 1 U PRBC   10/18 still on pressors, received 2 U PRBC  10/19 > extubated, off all pressors, following commands  10/20> passed swallow eval, CVVHD off, given lasix made great urine 10/22 CXR Moderate CHF with bilateral pleural effusions and mild pulmonary vascular congestion.   Culture 10/16 blood > neg   Antibiotics: 10/16 unasyn > 10/19   LINES / TUBES:  CVL R IJ CVL 10/15   Objective: VITAL  SIGNS:  BP 143/58  Pulse 72  Temp(Src) 98.2 F (36.8 C) (Oral)  Resp 16  Ht 5' 10"  (1.778 m)   Wt 111.1 kg (244 lb 14.9 oz)  BMI 35.14 kg/m2  SpO2 96%   Exam: General: A./O x4, NAD,  Lungs: Improved air entry bilaterally. Very few crackles at bases. No wheezing.  Cardiovascular: Regular rate and rhythm without murmur gallop or rub normal S1 and S2. Tender to palpation. Abdomen: Nontender, nondistended, soft, bowel sounds positive, no rebound, no ascites, no appreciable mass Extremities: 2+ pitting edema bilateral LE  Data Reviewed: Basic Metabolic Panel:  Recent Labs Lab 10/09/14 0410 10/10/14 0230 10/11/14 0430 10/11/14 0500 10/12/14 0812 10/13/14 0335 10/14/14 0545 10/15/14 0552  NA 140 139 139  --   --  140 138 140  K 4.2 4.1 3.8  --   --  4.3 4.4 4.3  CL 104 101 101  --   --  101 99 100  CO2 28 29 31   --   --  29 31 30   GLUCOSE 171* 145* 92  --   --  83 82 115*  BUN 51* 42* 33*  --   --  31* 27* 29*  CREATININE 1.37* 1.17 1.09  --   --  1.17 1.20 1.24  CALCIUM 8.1* 8.3* 8.3*  --   --  8.5 8.8 8.6  MG 2.7* 2.3  --  2.0 1.8  --   --   --   PHOS 3.3 4.3 4.8*  --   --   --   --   --    Liver Function Tests:  Recent Labs Lab 10/09/14 0410 10/10/14 0230 10/11/14 0430 10/14/14 0545  AST  --   --   --  28  ALT  --   --   --  16  ALKPHOS  --   --   --  132*  BILITOT  --   --   --  1.1  PROT  --   --   --  5.4*  ALBUMIN 2.3* 2.3* 2.3* 2.5*    CBC:  Recent Labs Lab 10/10/14 0200 10/11/14 0500 10/12/14 0812 10/13/14 0335 10/14/14 0545 10/15/14 0552  WBC 4.8 4.1 3.9* 4.0 3.6* 3.9*  NEUTROABS 3.7 3.1 3.0  --   --   --   HGB 8.5* 8.4* 9.2* 9.0* 9.3* 8.9*  HCT 25.2* 25.6* 28.3* 27.4* 28.9* 27.8*  MCV 90.0 90.8 90.1 90.4 93.2 92.7  PLT 58* 58* 58* 50* 65* 61*    CBG:  Recent Labs Lab 10/14/14 2005 10/15/14 0009 10/15/14 0354 10/15/14 0809 10/15/14 1250  GLUCAP 191* 178* 134* 95 107*    No results found for this or any previous visit (from the past 240 hour(s)).   Scheduled Meds:  Scheduled Meds: . sodium chloride   Intravenous Once  .  aspirin  81 mg Oral Daily  . atorvastatin  20 mg Oral q1800  . carvedilol  6.25 mg Oral BID WC  . clopidogrel  75 mg Oral Daily  . feeding supplement (ENSURE COMPLETE)  237 mL Oral BID BM  . insulin aspart  0-20 Units Subcutaneous 6 times per day  . insulin glargine  16 Units Subcutaneous Daily  . lidocaine  1 patch Transdermal Q24H  . lisinopril  5 mg Oral Daily  . pantoprazole  40 mg Oral BID  . sodium chloride  10-40 mL Intracatheter Q12H  . spironolactone  25 mg Oral Daily    Time spent on  care of this patient: 42 mins   Loreen Bankson , MD   [facility] Office  [phone] Pager - [phone]  On-Call/Text Page:      [email or web address]      password [email or web address]  If 7PM-7AM, please contact night-coverage [email or web address] Password [email or web address] 10/15/2014, 6:18 PM   LOS: 13 days

## 2014-10-15 NOTE — Progress Notes (Signed)
UR completed [place] Koranda K. Kordell Jafri, RN, BSN, MSHL, CCM  10/15/2014 2:21 PM

## 2014-10-16 ENCOUNTER — Telehealth: Payer: Self-pay | Admitting: Family Medicine

## 2014-10-16 DIAGNOSIS — I214 Non-ST elevation (NSTEMI) myocardial infarction: Secondary | ICD-10-CM

## 2014-10-16 LAB — BASIC METABOLIC PANEL
Anion gap: 9 (ref 5–15)
BUN: 24 mg/dL — ABNORMAL HIGH (ref 6–23)
CALCIUM: 8.4 mg/dL (ref 8.4–10.5)
CO2: 28 mEq/L (ref 19–32)
Chloride: 103 mEq/L (ref 96–112)
Creatinine, Ser: 1.23 mg/dL (ref 0.50–1.35)
GFR calc Af Amer: 76 mL/min — ABNORMAL LOW (ref >90)
GFR, EST NON AFRICAN AMERICAN: 66 mL/min — AB (ref >90)
Glucose, Bld: 146 mg/dL — ABNORMAL HIGH (ref 70–99)
POTASSIUM: 4.4 meq/L (ref 3.7–5.3)
SODIUM: 140 meq/L (ref 137–147)

## 2014-10-16 LAB — GLUCOSE, CAPILLARY
GLUCOSE-CAPILLARY: 152 mg/dL — AB (ref 70–99)
GLUCOSE-CAPILLARY: 172 mg/dL — AB (ref 70–99)
Glucose-Capillary: 125 mg/dL — ABNORMAL HIGH (ref 70–99)
Glucose-Capillary: 159 mg/dL — ABNORMAL HIGH (ref 70–99)

## 2014-10-16 LAB — CBC
HCT: 25.8 % — ABNORMAL LOW (ref 39.0–52.0)
Hemoglobin: 8.4 g/dL — ABNORMAL LOW (ref 13.0–17.0)
MCH: 29.8 pg (ref 26.0–34.0)
MCHC: 32.6 g/dL (ref 30.0–36.0)
MCV: 91.5 fL (ref 78.0–100.0)
PLATELETS: 62 10*3/uL — AB (ref 150–400)
RBC: 2.82 MIL/uL — AB (ref 4.22–5.81)
RDW: 16.8 % — ABNORMAL HIGH (ref 11.5–15.5)
WBC: 3.7 10*3/uL — AB (ref 4.0–10.5)

## 2014-10-16 LAB — PRO B NATRIURETIC PEPTIDE: Pro B Natriuretic peptide (BNP): 16361 pg/mL — ABNORMAL HIGH (ref 0–125)

## 2014-10-16 MED ORDER — GLUCERNA SHAKE PO LIQD
237.0000 mL | Freq: Two times a day (BID) | ORAL | Status: DC
  Administered 2014-10-16 – 2014-10-18 (×4): 237 mL via ORAL
  Filled 2014-10-16 (×7): qty 237

## 2014-10-16 MED ORDER — LISINOPRIL 5 MG PO TABS
5.0000 mg | ORAL_TABLET | Freq: Two times a day (BID) | ORAL | Status: DC
  Administered 2014-10-16 – 2014-10-18 (×5): 5 mg via ORAL
  Filled 2014-10-16 (×8): qty 1

## 2014-10-16 MED ORDER — FUROSEMIDE 10 MG/ML IJ SOLN
40.0000 mg | Freq: Three times a day (TID) | INTRAMUSCULAR | Status: DC
  Administered 2014-10-16 – 2014-10-17 (×4): 40 mg via INTRAVENOUS
  Filled 2014-10-16 (×7): qty 4

## 2014-10-16 MED ORDER — INSULIN GLARGINE 100 UNIT/ML ~~LOC~~ SOLN
16.0000 [IU] | Freq: Every day | SUBCUTANEOUS | Status: DC
  Administered 2014-10-16 – 2014-10-17 (×2): 16 [IU] via SUBCUTANEOUS
  Filled 2014-10-16 (×3): qty 0.16

## 2014-10-16 MED FILL — Sodium Chloride IV Soln 0.9%: INTRAVENOUS | Qty: 50 | Status: AC

## 2014-10-16 NOTE — Progress Notes (Signed)
NUTRITION FOLLOW UP  INTERVENTION:  D/C Ensure Complete po BID, each supplement provides 350 kcal and 13 grams of protein Glucerna Shake po TID, each supplement provides 220 kcal and 10 grams of protein  NUTRITION DIAGNOSIS: Inadequate oral intake, resolved.   Goal: Pt to meet >/= 90% of their estimated nutrition needs, met.   Monitor:  PO & supplemental intake, weight, labs, I/O's  ASSESSMENT: Pt with hx non - ischemic cardiomyopathy EF 35-40%, cirrhosis, DM, cholelithiasis presented 10/15 after arrest at work with EMS trained bystander CPR/defib.  Pt extubated 10/19 and passed swallow evaluation 10/20.   Pt s/p PTCA and stenting of the mid LAD and distal RCA into the PDA on 10/28. Plan for d/c home with [facility] 10/30.   PO intake has been 100%. Pt concerned about Ensure Complete causing elevated blood sugars. Pt agrees to [employer] Discussed with RN.  CBG's: 125-152  Height: Ht Readings from Last 1 Encounters:  10/02/14 5' 10"  (1.778 m)    Weight: Wt Readings from Last 1 Encounters:  10/16/14 246 lb 14.6 oz (112 kg)  admission weight: 243 lb (110.4 kg) 10/15  BMI:  Body mass index is 35.43 kg/(m^2).  Estimated Nutritional Needs: Kcal: 2200-2400 Protein: 110-120 gm Fluid: per MD  Skin: laceration on head  Diet Order:   CHO Modified/Heart Healthy Meal Completion: 100%   Intake/Output Summary (Last 24 hours) at 10/16/14 1432 Last data filed at 10/16/14 1154  Gross per 24 hour  Intake    490 ml  Output   1025 ml  Net   -535 ml   Last BM: 10/28  Labs:   Recent Labs Lab 10/10/14 0230 10/11/14 0430 10/11/14 0500 10/12/14 0812  10/14/14 0545 10/15/14 0552 10/16/14 0323  NA 139 139  --   --   < > 138 140 140  K 4.1 3.8  --   --   < > 4.4 4.3 4.4  CL 101 101  --   --   < > 99 100 103  CO2 29 31  --   --   < > 31 30 28   BUN 42* 33*  --   --   < > 27* 29* 24*  CREATININE 1.17 1.09  --   --   < > 1.20 1.24 1.23  CALCIUM 8.3* 8.3*  --   --   < > 8.8  8.6 8.4  MG 2.3  --  2.0 1.8  --   --   --   --   PHOS 4.3 4.8*  --   --   --   --   --   --   GLUCOSE 145* 92  --   --   < > 82 115* 146*  < > = values in this interval not displayed.  CBG (last 3)   Recent Labs  10/15/14 2112 10/16/14 0709 10/16/14 1315  GLUCAP 190* 125* 152*    Scheduled Meds: . sodium chloride   Intravenous Once  . aspirin  81 mg Oral Daily  . atorvastatin  20 mg Oral q1800  . carvedilol  6.25 mg Oral BID WC  . clopidogrel  75 mg Oral Daily  . feeding supplement (ENSURE COMPLETE)  237 mL Oral BID BM  . furosemide  40 mg Intravenous Q8H  . insulin aspart  0-20 Units Subcutaneous TID AC  . insulin aspart  0-5 Units Subcutaneous QHS  . insulin glargine  16 Units Subcutaneous QHS  . lidocaine  1 patch Transdermal Q24H  .  lisinopril  5 mg Oral BID  . pantoprazole  40 mg Oral BID  . sodium chloride  10-40 mL Intracatheter Q12H  . spironolactone  25 mg Oral Daily    Continuous Infusions: . sodium chloride Stopped (10/15/14 1815)    [place], LDN, CNSC [phone] Pager [phone] After Hours Pager

## 2014-10-16 NOTE — Progress Notes (Signed)
CARDIAC REHAB PHASE I   PRE:  Rate/Rhythm: 69 SR    BP: sitting 128/52    SaO2: 100 RA  MODE:  Ambulation: 420 ft   POST:  Rate/Rhythm: 90 SR    BP: sitting 133/55     SaO2: 97 RA  Tolerated very well. Walked 50 ft without RW but pt felt more supported (c/o back pain) with RW. Steady, no c/o. Felt good. To recliner with VSS. Planned to do education however pt received visitor from work. Will f/u in am.  [id number]   Harriet Masson CES, ACSM 10/16/2014 9:41 AM

## 2014-10-16 NOTE — Progress Notes (Signed)
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 Pt suddenly collapsed, fell backward and hit his head. AED advised shockable rhythm. Approx 10 minutes CPR with ROSC before EMS arrival. Patient was admitted to [facility] service. He underwent hypothermic protocol, and placed on pressors. Patient was also placed on CRRT due to high potassium. On 10/06/2014 he was extubated. Patient was evaluated by nephrology during this hospitalization. His Acute kidney injury in setting of cardiac arrest has resolved.  By 10/07/2014 CVVH was discontinued. He was given Lasix. On 10/10/2014 he underwent cardiac catheterization which showed severe three-vessel obstructive coronary artery disease, did not undergo percutaneous intervention. He was seen by cardiothoracic surgery but CABG was not considered a good option secondary to comorbidities. Case discussed with cardiology and question of PCI will be reconsidered if the thrombocytopenia improves. PMT met with patient and family.He underwent cardiac cath on 10/28 and had  PCI to  mLAD and dRCA . He is currently on IV lasix.    Subjective: Comfortable, no chest pain orsob.   Assessment/Plan: Cardiac arrest presumably from ischemic cardiomyopathy - EF 35-40% per echo 07/2014, but 19% per nuclear study  -On 10/10/2014 patient undergoing cardiac catheterization which showed severe three-vessel obstructive coronary artery disease. He did not undergo percutaneous intervention due to low platelets. Coronary artery bypass grafting not a good option given multiple comorbidities. - EP consulted was consulted and might not be a ICD nor a life vest candidate. Cardiac MRI done and as below. Palliative medicine input has been noted.    -Continue IV Lasix. Carvedilol. Also on Spironolactone. - He underwent cardiac cath on 10/28 and had  PCI to  mLAD and dRCA  Acute respiratory failure, post arrest  - resolved   Apical left pneumothax noted on CXR 10/15, -resolved  -O2 as needed for O2 saturation > 92%   Pulmonary edema/pleural effusion/Pedal Edema -Strict in and out -Daily weight -On IV Lasix.  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   Anemia - stable - no clear source of bleeding  - PRBC for goal hgb > 8gm%   DM type II -Continue Lantus and SSI - CBG (last 3)   Recent Labs  10/16/14 0709 10/16/14 1315 10/16/14 1753  GLUCAP 125* 152* 172*      Bradycardia, junctional - resolved with rewarming. HR between 60 to 70's  Hyperkalemia -resolved   AKI  -Likely secondary to cardiac arrest - Improved  Hx cholelithiasis  -deemed not to be a surgical candidate for cholecystectomy recently.   DVT Prophylaxis: SCD's Code Status: FULL Family Communication: Discussed with patient Disposition Plan: Per cardiology   Consultants: Cardiology, EP, Nephrology, PMT   Procedure/Significant Events: 10/15 hyperkalemia (on aldactone & lisinopril) requiring CRRT  CT head 10/15>>>neg, Small air-fluid level in the left maxillary sinus and right sphenoid sinus  10/03/14 : : Cooled to 33  Bradycardic , on max pressors  Anuric  Sedated , paralysed  On CRRT   10/04/14  On CRRT for high K., on pressors., Just rewarmed: following all commands, received 1 U PRBC  10/18 still on pressors, received 2 U PRBC  10/19 > extubated, off all pressors, following commands  10/20> passed swallow eval, CVVHD off, given lasix made great urine 10/22 CXR Moderate CHF with  bilateral pleural effusions and mild pulmonary vascular congestion.   Culture 10/16 blood > neg   Antibiotics: 10/16 unasyn > 10/19   LINES / TUBES:  CVL R IJ CVL 10/15   Objective: VITAL SIGNS:  BP 112/56  Pulse 64  Temp(Src) 98 F (36.7 C) (Oral)  Resp 20  Ht 5' 10"  (1.778 m)  Wt 112 kg (246 lb 14.6 oz)  BMI 35.43 kg/m2  SpO2 100%   Exam: General: A./O x4, NAD,  Lungs: Improved air entry bilaterally. Very few crackles at bases. No wheezing.  Cardiovascular: Regular rate and rhythm without murmur gallop or rub normal S1 and S2. Tender to palpation. Abdomen: Nontender, nondistended, soft, bowel sounds positive, no rebound, no ascites, no appreciable mass Extremities: 2+ pitting edema bilateral LE  Data Reviewed: Basic Metabolic Panel:  Recent Labs Lab 10/10/14 0230 10/11/14 0430 10/11/14 0500 10/12/14 9735 10/13/14 0335 10/14/14 0545 10/15/14 0552 10/16/14 0323  NA 139 139  --   --  140 138 140 140  K 4.1 3.8  --   --  4.3 4.4 4.3 4.4  CL 101 101  --   --  101 99 100 103  CO2 29 31  --   --  29 31 30 28   GLUCOSE 145* 92  --   --  83 82 115* 146*  BUN 42* 33*  --   --  31* 27* 29* 24*  CREATININE 1.17 1.09  --   --  1.17 1.20 1.24 1.23  CALCIUM 8.3* 8.3*  --   --  8.5 8.8 8.6 8.4  MG 2.3  --  2.0 1.8  --   --   --   --   PHOS 4.3 4.8*  --   --   --   --   --   --    Liver Function Tests:  Recent Labs Lab 10/10/14 0230 10/11/14 0430 10/14/14 0545  AST  --   --  28  ALT  --   --  16  ALKPHOS  --   --  132*  BILITOT  --   --  1.1  PROT  --   --  5.4*  ALBUMIN 2.3* 2.3* 2.5*    CBC:  Recent Labs Lab 10/10/14 0200 10/11/14 0500 10/12/14 0812 10/13/14 0335 10/14/14 0545 10/15/14 0552 10/16/14 0323  WBC 4.8 4.1 3.9* 4.0 3.6* 3.9* 3.7*  NEUTROABS 3.7 3.1 3.0  --   --   --   --   HGB 8.5* 8.4* 9.2* 9.0* 9.3* 8.9* 8.4*  HCT 25.2* 25.6* 28.3* 27.4* 28.9* 27.8* 25.8*  MCV 90.0 90.8 90.1 90.4 93.2 92.7 91.5  PLT 58* 58* 58* 50* 65* 61* 62*     CBG:  Recent Labs Lab 10/15/14 1744 10/15/14 2112 10/16/14 0709 10/16/14 1315 10/16/14 1753  GLUCAP 174* 190* 125* 152* 172*    No results found for this or any previous visit (from the past 240 hour(s)).   Scheduled Meds:  Scheduled Meds: . sodium chloride   Intravenous Once  . aspirin  81 mg Oral Daily  . atorvastatin  20 mg Oral q1800  . carvedilol  6.25 mg Oral BID WC  . clopidogrel  75 mg Oral Daily  . feeding supplement (GLUCERNA SHAKE)  237 mL Oral BID BM  .  furosemide  40 mg Intravenous Q8H  . insulin aspart  0-20 Units Subcutaneous TID AC  . insulin aspart  0-5 Units Subcutaneous QHS  . insulin glargine  16 Units Subcutaneous QHS  . lidocaine  1 patch Transdermal Q24H  . lisinopril  5 mg Oral BID  . pantoprazole  40 mg Oral BID  . sodium chloride  10-40 mL Intracatheter Q12H  . spironolactone  25 mg Oral Daily    Time spent on care of this patient: 15 mins   Psalms Olarte , MD   [facility] Office  [phone] Pager - [phone]  On-Call/Text Page:      [email or web address]      password [email or web address]  If 7PM-7AM, please contact night-coverage [email or web address] Password [email or web address] 10/16/2014, 6:39 PM   LOS: 14 days

## 2014-10-16 NOTE — Progress Notes (Signed)
Patient ID: [name], male   DOB: 07-Jun-1961, 53 y.o.   MRN: [id number]   Patient with history of ETOH cirrhosis and recent episode of biliary sepsis/gallstone pancreatitis in 8/15.  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  Cardiomyopathy with EF 35-40% by echo in 8/15.  Cardiolite 8/15 with inferior and apical scar.   Admitted 10/02/14 after cardiac arrest (likely vfib) terminated by defibrillation x 2 by AED.  Initially underwent hypothermia protocol, has recovered well, now awake/alert, ambulating, etc.  Platelets remain low.    LHC (10/10/14) with 80-90% proximal ramus, 90% mLAD, subtotalled dLAD, 90% D1, 90% dRCA/proxPDA, LVEDP 27. No intervention with low plts.   Cardiac MRI (10/26) showed EF 40% with regional wall motion abnormalities, normal RV size and systolic function.  Delayed enhancement suggested a mixed picture of viability.  The mid-apical anteroseptal wall and true apex did not appear viable.   PCI to mLAD and dRCA/PDA with DES x 2 yesterday.  He did not get Lasix yesterday, still a lot of leg swelling.  Pain with deep breathing due to rib fractures with CPR. Platelets stable.   Scheduled Meds: . sodium chloride   Intravenous Once  . aspirin  81 mg Oral Daily  . atorvastatin  20 mg Oral q1800  . carvedilol  6.25 mg Oral BID WC  . clopidogrel  75 mg Oral Daily  . feeding supplement (ENSURE COMPLETE)  237 mL Oral BID BM  . furosemide  40 mg Intravenous Q8H  . insulin aspart  0-20 Units Subcutaneous TID AC  . insulin aspart  0-5 Units Subcutaneous QHS  . insulin glargine  16 Units Subcutaneous Daily  . lidocaine  1 patch Transdermal Q24H  . lisinopril  5 mg Oral BID  . pantoprazole  40 mg Oral BID  . sodium chloride  10-40 mL Intracatheter Q12H  . spironolactone  25 mg Oral Daily   Continuous Infusions: . sodium chloride Stopped (10/15/14 1815)   PRN Meds:.albuterol, fentaNYL, labetalol, ondansetron (ZOFRAN) IV, sodium chloride,  traMADol    Filed Vitals:   10/15/14 2328 10/16/14 0004 10/16/14 0500 10/16/14 0530  BP: 154/69   137/58  Pulse: 78   75  Temp: 98.1 F (36.7 C)   98.4 F (36.9 C)  TempSrc: Oral   Oral  Resp: 18   20  Height:      Weight:  246 lb 14.6 oz (112 kg) 246 lb 14.6 oz (112 kg)   SpO2: 94%   94%    Intake/Output Summary (Last 24 hours) at 10/16/14 0758 Last data filed at 10/16/14 0537  Gross per 24 hour  Intake    490 ml  Output    550 ml  Net    -60 ml    LABS: Basic Metabolic Panel:  Recent Labs  40/98/1110/28/15 0552 10/16/14 0323  NA 140 140  K 4.3 4.4  CL 100 103  CO2 30 28  GLUCOSE 115* 146*  BUN 29* 24*  CREATININE 1.24 1.23  CALCIUM 8.6 8.4   Liver Function Tests:  Recent Labs  10/14/14 0545  AST 28  ALT 16  ALKPHOS 132*  BILITOT 1.1  PROT 5.4*  ALBUMIN 2.5*   No results found for this basename: LIPASE, AMYLASE,  in the last 72 hours CBC:  Recent Labs  10/15/14 0552 10/16/14 0323  WBC 3.9* 3.7*  HGB 8.9* 8.4*  HCT 27.8* 25.8*  MCV 92.7 91.5  PLT 61* 62*   Cardiac Enzymes: No results found  for this basename: CKTOTAL, CKMB, CKMBINDEX, TROPONINI,  in the last 72 hours BNP: No components found with this basename: POCBNP,  D-Dimer: No results found for this basename: DDIMER,  in the last 72 hours Hemoglobin A1C: No results found for this basename: HGBA1C,  in the last 72 hours Fasting Lipid Panel: No results found for this basename: CHOL, HDL, LDLCALC, TRIG, CHOLHDL, LDLDIRECT,  in the last 72 hours Thyroid Function Tests: No results found for this basename: TSH, T4TOTAL, FREET3, T3FREE, THYROIDAB,  in the last 72 hours Anemia Panel: No results found for this basename: VITAMINB12, FOLATE, FERRITIN, TIBC, IRON, RETICCTPCT,  in the last 72 hours  RADIOLOGY: Ct Head Wo Contrast  10/02/2014   CLINICAL DATA:  53 year old male with history of trauma after after a fall during a syncopal episode during which the patient fell backward and struck his  head. Status post CPR.  EXAM: CT HEAD WITHOUT CONTRAST  TECHNIQUE: Contiguous axial images were obtained from the base of the skull through the vertex without intravenous contrast.  COMPARISON:  No priors.  FINDINGS: No acute displaced skull fractures are identified. No acute intracranial abnormality. Specifically, no evidence of acute post-traumatic intracranial hemorrhage, no definite regions of acute/subacute cerebral ischemia, no focal mass, mass effect, hydrocephalus or abnormal intra or extra-axial fluid collections. The visualized paranasal sinuses and mastoids are well pneumatized, with exception of a small air-fluid level in the posterior aspect of the left maxillary sinus and right sphenoid sinus.  IMPRESSION: 1. Small amount of soft tissue swelling in the occipital scalp, without underlying displaced skull fracture or acute intracranial abnormalities. 2. Small air-fluid level in the left maxillary sinus and right sphenoid sinus. This is nonspecific, but correlation for signs of sinusitis is recommended.   Electronically Signed   By: Trudie Reed M.D.   On: 10/02/2014 16:36   PHYSICAL EXAM General: NAD Neck: Thick, JVP 8-9 cm, no thyromegaly or thyroid nodule.  Lungs: Decreased breath sounds at bases bilaterally.  CV: Nondisplaced PMI.  Heart regular S1/S2, no S3/S4, 2/6 early SEM RUSB.  2+ edema 1/2 to knees bilaterally.   Abdomen: Soft, nontender, no hepatosplenomegaly, mild distention.  Neurologic: Alert and oriented x 3.  Psych: Normal affect. Extremities: No clubbing or cyanosis.   TELEMETRY: Reviewed telemetry pt in NSR  ASSESSMENT AND PLAN: 53 yo with history of ETOH cirrhosis, gallstone pancreatitis, chronic thrombocytopenia, and cardiomyopathy presented with presumed vfib arrest s/p defibrillation with AED.  He was cooled and recovered well neurologically.  LHC showed severe 2 VD, no intervention given thrombocytopenia.  1. Cardiac arrest: Suspect ventricular fibrillation.  EF  40% by cardiac MRI.  The 95% mLAD stenosis noted at cath was probably an acute finding and may have been trigger for event.  Now s/p PCI mLAD and dRCA. Will gradually titrate up beta blocker.  Per EP, not ICD candidate at this time, so will hold off on Lifevest as no endpoint present (and he was revascularized).  2. CAD: See cath report above.  Too high risk for CABG.   I did a cardiac MRI (see above) which showed a mixed picture for viability.  The mid to apical anteroseptal wall and the true apex probably are not viable. Mid LAD and distal RCA/proximal PDA PCI done on 10/28.  - Will repeat P2Y12 test in 1 week, could envision stopping ASA 81 given thrombocytopenia if platelets well-inhibited with Plavix.  - Added low dose statin given stable LFTs (atorvastatin 20).  3. Acute on chronic systolic CHF:  EF 35-40% in 8/15.  EF 40% by cardiac MRI 10/26. He remains volume overloaded on exam but improved. He did not get Lasix yesterday, BNP still quite high.  - Continue current Coreg and spironolactone. - Increase lisinopril to 5 mg bid.  - Lasix 40 mg IV every 8 hours today, hopefully to po tomorrow.   4. Cirrhosis: Related to prior heavy ETOH.  He has now quit drinking.  Transaminases have only been mildly elevated and were normal when last checked. Will repeat in am.  5. Chronic thrombocytopenia: Likely due to splenomegaly in the setting of cirrhosis.  Also possible ETOH-related marrow suppression.  See discussion above.  Platelets seem to have stabilized > 60K now. 6. Bicuspid aortic valve: Mild AS.  7. CKD: Creatinine stable.  8. Type II diabetes: Continue insulin.  9. H/o biliary pancreatitis and sepsis: Still has gallbladder, was deemed too high risk for surgery in 8/15.  10. Ambulate with PT and cardiac rehab.   Marca Ancona 10/16/2014 7:58 AM

## 2014-10-16 NOTE — Telephone Encounter (Signed)
[facility] called to let us know that [name] was d/c from hospital and will be needing home health services. They have Dr. [name] listed as his PCP and will be contacting him for orders.[name]

## 2014-10-16 NOTE — Progress Notes (Signed)
Patient YY:TKPTWS A Yam      DOB: April 02, 1961      FKC:[id number]  Patient seen this am for emotional support only.  He remains hesitant to address his goals of care through advanced directives.  He tolerated cardiac cath yesterday without issue.  He is anxious to get home.  He will not commit to further discussion of his code status or living will.  We would be glad to see him if he is hospitalized again and more importantly we would be glad to assist his sister if he returns in a condition where he is not able to advocate for himself.  Palliative Medicine will sign off at this time. Please call to reconsult if needed.  Hajira Verhagen L. Ladona Ridgel, MD MBA The Palliative Medicine Team at [facility] Phone: [phone] Pager: [phone] ( Use team phone after hours)

## 2014-10-17 LAB — CBC
HCT: 27.5 % — ABNORMAL LOW (ref 39.0–52.0)
HEMOGLOBIN: 9.1 g/dL — AB (ref 13.0–17.0)
MCH: 30.2 pg (ref 26.0–34.0)
MCHC: 33.1 g/dL (ref 30.0–36.0)
MCV: 91.4 fL (ref 78.0–100.0)
Platelets: 66 10*3/uL — ABNORMAL LOW (ref 150–400)
RBC: 3.01 MIL/uL — ABNORMAL LOW (ref 4.22–5.81)
RDW: 16.5 % — ABNORMAL HIGH (ref 11.5–15.5)
WBC: 3.3 10*3/uL — ABNORMAL LOW (ref 4.0–10.5)

## 2014-10-17 LAB — BASIC METABOLIC PANEL
Anion gap: 9 (ref 5–15)
BUN: 24 mg/dL — ABNORMAL HIGH (ref 6–23)
CHLORIDE: 103 meq/L (ref 96–112)
CO2: 29 mEq/L (ref 19–32)
CREATININE: 1.3 mg/dL (ref 0.50–1.35)
Calcium: 8.8 mg/dL (ref 8.4–10.5)
GFR calc Af Amer: 71 mL/min — ABNORMAL LOW (ref >90)
GFR calc non Af Amer: 62 mL/min — ABNORMAL LOW (ref >90)
Glucose, Bld: 141 mg/dL — ABNORMAL HIGH (ref 70–99)
POTASSIUM: 4.3 meq/L (ref 3.7–5.3)
Sodium: 141 mEq/L (ref 137–147)

## 2014-10-17 LAB — GLUCOSE, CAPILLARY
GLUCOSE-CAPILLARY: 169 mg/dL — AB (ref 70–99)
GLUCOSE-CAPILLARY: 158 mg/dL — AB (ref 70–99)
GLUCOSE-CAPILLARY: 182 mg/dL — AB (ref 70–99)
Glucose-Capillary: 112 mg/dL — ABNORMAL HIGH (ref 70–99)

## 2014-10-17 MED ORDER — FUROSEMIDE 10 MG/ML IJ SOLN
80.0000 mg | Freq: Two times a day (BID) | INTRAMUSCULAR | Status: DC
  Administered 2014-10-17 – 2014-10-18 (×2): 80 mg via INTRAVENOUS
  Filled 2014-10-17 (×3): qty 8

## 2014-10-17 MED ORDER — METOLAZONE 2.5 MG PO TABS
2.5000 mg | ORAL_TABLET | Freq: Every day | ORAL | Status: DC
  Administered 2014-10-17 – 2014-10-18 (×2): 2.5 mg via ORAL
  Filled 2014-10-17 (×2): qty 1

## 2014-10-17 NOTE — Progress Notes (Signed)
CARDIAC REHAB PHASE I   PRE:  Rate/Rhythm: 70 SR    BP: sitting 118/53    SaO2:   MODE:  Ambulation: 900 ft   POST:  Rate/Rhythm: 92 SR    BP: sitting 126/59     SaO2:   Pt donned tennis shoes and walked with RW. Feels better with shoes on due to neuropathy. Able to walk incline as well (slight LOB). Pt tired after walk but feels good. Ed completed, pt with many diet questions as he tries to maintain his DM diet. Nervous about adding low sodium/heart healthy. Interested in CRPII and will send referral to [place]. Very receptive pt. Discussed quitting dipping tobacco.  [id number]  Elissa Lovett [place] CES, ACSM 10/17/2014 10:01 AM

## 2014-10-17 NOTE — Progress Notes (Signed)
[place] TEAM 1 - Stepdown/ICU TEAM  [name] UJW:[id number] DOB: 09-08-1961 DOA: 10/02/2014 PCP: Ralph Massed, MD  Brief Narrative: 53yo M Hx nonischemic cardiomyopathy EF 35-40%, cirrhosis, DM type 2, and cholelithiasis who presented 10/15 after arrest at work with EMS/trained bystander CPR/defib. Pt suddenly collapsed, fell backward and hit his head. AED advised shockable rhythm. Approx 10 minutes CPR with ROSC before EMS arrival.   Patient was admitted to [facility] service. He underwent hypothermic protocol, and was placed on pressors. Patient was also placed on CRRT due to high potassium. On 10/06/2014 he was extubated. Patient was evaluated by nephrology during this hospitalization. His Acute kidney injury in setting of cardiac arrest resolved.  By 10/07/2014 CVVH was discontinued. He was given Lasix.   On 10/10/2014 he underwent cardiac catheterization which showed severe three-vessel obstructive coronary artery disease but he did not undergo percutaneous intervention. He was seen by cardiothoracic surgery but CABG was not considered a good option secondary to comorbidities. He underwent cardiac cath on 10/28 and had  PCI to LAD and RCA .   Subjective:  No new complaints today.  Is anxious to d/c home asap.    Assessment/Plan:  Cardiac arrest - suspected VFib  -EF 35-40% per echo 07/2014 -On 10/10/2014 patient undergoing cardiac catheterization which showed severe three-vessel obstructive coronary artery disease. He did not undergo percutaneous intervention due to low platelets. Coronary artery bypass grafting not a good option given multiple comorbidities. - EP consulted was consulted and might not be an ICD nor a life vest candidate. Cardiac MRI done and as below. Palliative medicine input has been noted.  -Continue IV Lasix, Carvedilol, Spironolactone. 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 -not felt to be a liver transplant candidate both due to multiple comorbidites   Thrombocytopenia due to acute illness and cirrhosis   -plt count slowly rising - follow   Anemia -stable  DM type II -Continue Lantus and SSI - CBG reasonably controlled   Bradycardia, junctional -resolved with rewarming  Hyperkalemia -resolved   AKI  -secondary to cardiac arrest -Improved  Hx cholelithiasis  -deemed not to be a surgical candidate for cholecystectomy recently  DVT Prophylaxis: SCD's Code Status: FULL Family Communication: Discussed with patient and sister  Disposition Plan: SDU  Consultants: Cardiology EP Nephrology PMT   Procedure/Significant Events: 10/15 hyperkalemia (on aldactone & lisinopril) requiring CRRT - CT head 10/15>>>neg, Small air-fluid level in the left maxillary sinus and right sphenoid sinus  10/03/14 :Cooled to 33 - Bradycardic , on max pressors - Anuric - Sedated , paralysed - On CRRT   10/04/14  On CRRT for high K., on pressors., Just rewarmed: following all commands, received 1 U PRBC   10/18 still on pressors, received 2 U PRBC   10/19 > extubated, off all pressors, following commands   10/20> passed swallow eval, CVVHD off, given lasix made great urine  10/22 CXR Moderate CHF with bilateral pleural effusions and mild pulmonary vascular congestion.   Antibiotics: 10/16 unasyn > 10/19    Objective: BP 105/45  Pulse 66  Temp(Src) 98.8 F (37.1 C) (Oral)  Resp 18  Ht 5\' 10"  (1.778 m)  Wt 110.3 kg (243  lb 2.7 oz)  BMI 34.89 kg/m2  SpO2 97%   Exam: General: no acute disease   Lungs: CTA B w/o wheeze  Cardiovascular: Regular rate and rhythm without murmur gallop or rub  Abdomen: Nontender, nondistended, soft, bowel sounds  positive, no rebound, no ascites, no appreciable mass Extremities: 2+ pitting pedal edema bilateral LE  Data Reviewed: Basic Metabolic Panel:  Recent Labs Lab 10/11/14 0430 10/11/14 0500 10/12/14 [id number] 10/13/14 0335 10/14/14 0545 10/15/14 0552 10/16/14 0323 10/17/14 0355  NA 139  --   --  140 138 140 140 141  K 3.8  --   --  4.3 4.4 4.3 4.4 4.3  CL 101  --   --  101 99 100 103 103  CO2 31  --   --  29 31 30 28 29   GLUCOSE 92  --   --  83 82 115* 146* 141*  BUN 33*  --   --  31* 27* 29* 24* 24*  CREATININE 1.09  --   --  1.17 1.20 1.24 1.23 1.30  CALCIUM 8.3*  --   --  8.5 8.8 8.6 8.4 8.8  MG  --  2.0 1.8  --   --   --   --   --   PHOS 4.8*  --   --   --   --   --   --   --    Liver Function Tests:  Recent Labs Lab 10/11/14 0430 10/14/14 0545  AST  --  28  ALT  --  16  ALKPHOS  --  132*  BILITOT  --  1.1  PROT  --  5.4*  ALBUMIN 2.3* 2.5*    CBC:  Recent Labs Lab 10/11/14 0500 10/12/14 0812 10/13/14 0335 10/14/14 0545 10/15/14 0552 10/16/14 0323 10/17/14 0355  WBC 4.1 3.9* 4.0 3.6* 3.9* 3.7* 3.3*  NEUTROABS 3.1 3.0  --   --   --   --   --   HGB 8.4* 9.2* 9.0* 9.3* 8.9* 8.4* 9.1*  HCT 25.6* 28.3* 27.4* 28.9* 27.8* 25.8* 27.5*  MCV 90.8 90.1 90.4 93.2 92.7 91.5 91.4  PLT 58* 58* 50* 65* 61* 62* 66*    CBG:  Recent Labs Lab 10/16/14 1315 10/16/14 1753 10/16/14 2144 10/17/14 0632 10/17/14 1230  GLUCAP 152* 172* 159* 112* 169*    Scheduled Meds:  Scheduled Meds: . sodium chloride   Intravenous Once  . aspirin  81 mg Oral Daily  . atorvastatin  20 mg Oral q1800  . carvedilol  6.25 mg Oral BID WC  . clopidogrel  75 mg Oral Daily  . feeding supplement (GLUCERNA SHAKE)  237 mL Oral BID BM  . furosemide  80 mg Intravenous BID  . insulin aspart  0-20 Units Subcutaneous TID AC  . insulin aspart  0-5 Units Subcutaneous QHS  . insulin glargine  16 Units Subcutaneous QHS  . lidocaine  1 patch Transdermal Q24H  . lisinopril  5 mg Oral BID  .  metolazone  2.5 mg Oral Daily  . pantoprazole  40 mg Oral BID  . sodium chloride  10-40 mL Intracatheter Q12H  . spironolactone  25 mg Oral Daily    Time spent on care of this patient: 25 mins  Lonia BloodJeffrey T. Patriciann Becht, MD [facility] For Consults/Admissions - Flow Manager - [phone] Office  [phone] Pager [phone]  On-Call/Text Page:      [email or web address]      password [email or web address]  10/17/2014, 5:59 PM   LOS:  15 days

## 2014-10-17 NOTE — Progress Notes (Signed)
Patient ID: [name], male   DOB: 1961/07/09, 53 y.o.   MRN: [id number]   Patient with history of ETOH cirrhosis and recent episode of biliary sepsis/gallstone pancreatitis in 8/15.  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  Cardiomyopathy with EF 35-40% by echo in 8/15.  Cardiolite 8/15 with inferior and apical scar.   Admitted 10/02/14 after cardiac arrest (likely vfib) terminated by defibrillation x 2 by AED.  Initially underwent hypothermia protocol, has recovered well, now awake/alert, ambulating, etc.  Platelets remain low.    LHC (10/10/14) with 80-90% proximal ramus, 90% mLAD, subtotalled dLAD, 90% D1, 90% dRCA/proxPDA, LVEDP 27. No intervention with low plts.   Cardiac MRI (10/26) showed EF 40% with regional wall motion abnormalities, normal RV size and systolic function.  Delayed enhancement suggested a mixed picture of viability.  The mid-apical anteroseptal wall and true apex did not appear viable.   PCI to mLAD and dRCA/PDA with DES x 2 yesterday.    On lasix 40 iv tid. I/Os +. Weight unchanged. No dyspnea or orthopnea. Ambulating.  Pain with deep breathing due to rib fractures with CPR.  Scheduled Meds: . sodium chloride   Intravenous Once  . aspirin  81 mg Oral Daily  . atorvastatin  20 mg Oral q1800  . carvedilol  6.25 mg Oral BID WC  . clopidogrel  75 mg Oral Daily  . feeding supplement (GLUCERNA SHAKE)  237 mL Oral BID BM  . furosemide  40 mg Intravenous Q8H  . insulin aspart  0-20 Units Subcutaneous TID AC  . insulin aspart  0-5 Units Subcutaneous QHS  . insulin glargine  16 Units Subcutaneous QHS  . lidocaine  1 patch Transdermal Q24H  . lisinopril  5 mg Oral BID  . pantoprazole  40 mg Oral BID  . sodium chloride  10-40 mL Intracatheter Q12H  . spironolactone  25 mg Oral Daily   Continuous Infusions: . sodium chloride Stopped (10/15/14 1815)   PRN Meds:.albuterol, fentaNYL, labetalol, ondansetron (ZOFRAN) IV, sodium chloride,  traMADol    Filed Vitals:   10/17/14 0357 10/17/14 0500 10/17/14 0806 10/17/14 1231  BP: 117/56  118/53 117/56  Pulse: 70  67 63  Temp: 98.9 F (37.2 C)  98.8 F (37.1 C) 98.7 F (37.1 C)  TempSrc: Oral  Oral Oral  Resp: 18  18 18   Height:      Weight:  110.3 kg (243 lb 2.7 oz)    SpO2: 95%  95% 96%    Intake/Output Summary (Last 24 hours) at 10/17/14 1354 Last data filed at 10/17/14 1100  Gross per 24 hour  Intake    560 ml  Output   3700 ml  Net  -3140 ml    LABS: Basic Metabolic Panel:  Recent Labs  40/98/11 0323 10/17/14 0355  NA 140 141  K 4.4 4.3  CL 103 103  CO2 28 29  GLUCOSE 146* 141*  BUN 24* 24*  CREATININE 1.23 1.30  CALCIUM 8.4 8.8   Liver Function Tests: No results found for this basename: AST, ALT, ALKPHOS, BILITOT, PROT, ALBUMIN,  in the last 72 hours No results found for this basename: LIPASE, AMYLASE,  in the last 72 hours CBC:  Recent Labs  10/16/14 0323 10/17/14 0355  WBC 3.7* 3.3*  HGB 8.4* 9.1*  HCT 25.8* 27.5*  MCV 91.5 91.4  PLT 62* 66*   Cardiac Enzymes: No results found for this basename: CKTOTAL, CKMB, CKMBINDEX, TROPONINI,  in the last 72  hours BNP: No components found with this basename: POCBNP,  D-Dimer: No results found for this basename: DDIMER,  in the last 72 hours Hemoglobin A1C: No results found for this basename: HGBA1C,  in the last 72 hours Fasting Lipid Panel: No results found for this basename: CHOL, HDL, LDLCALC, TRIG, CHOLHDL, LDLDIRECT,  in the last 72 hours Thyroid Function Tests: No results found for this basename: TSH, T4TOTAL, FREET3, T3FREE, THYROIDAB,  in the last 72 hours Anemia Panel: No results found for this basename: VITAMINB12, FOLATE, FERRITIN, TIBC, IRON, RETICCTPCT,  in the last 72 hours  RADIOLOGY: Ct Head Wo Contrast  10/02/2014   CLINICAL DATA:  53 year old male with history of trauma after after a fall during a syncopal episode during which the patient fell backward and struck  his head. Status post CPR.  EXAM: CT HEAD WITHOUT CONTRAST  TECHNIQUE: Contiguous axial images were obtained from the base of the skull through the vertex without intravenous contrast.  COMPARISON:  No priors.  FINDINGS: No acute displaced skull fractures are identified. No acute intracranial abnormality. Specifically, no evidence of acute post-traumatic intracranial hemorrhage, no definite regions of acute/subacute cerebral ischemia, no focal mass, mass effect, hydrocephalus or abnormal intra or extra-axial fluid collections. The visualized paranasal sinuses and mastoids are well pneumatized, with exception of a small air-fluid level in the posterior aspect of the left maxillary sinus and right sphenoid sinus.  IMPRESSION: 1. Small amount of soft tissue swelling in the occipital scalp, without underlying displaced skull fracture or acute intracranial abnormalities. 2. Small air-fluid level in the left maxillary sinus and right sphenoid sinus. This is nonspecific, but correlation for signs of sinusitis is recommended.   Electronically Signed   By: Trudie Reedaniel  Entrikin M.D.   On: 10/02/2014 16:36   PHYSICAL EXAM General: NAD Neck: Thick, JVP 8-9 cm, no thyromegaly or thyroid nodule.  Lungs: Decreased breath sounds at bases bilaterally.  CV: Nondisplaced PMI.  Heart regular S1/S2, no S3/S4, 2/6 early SEM RUSB.  2+ edema 1/2 to knees bilaterally.   Abdomen: Soft, nontender, no hepatosplenomegaly, mild distention.  Neurologic: Alert and oriented x 3.  Psych: Normal affect. Extremities: No clubbing or cyanosis.   TELEMETRY: Reviewed telemetry pt in NSR  ASSESSMENT AND PLAN: 53 yo with history of ETOH cirrhosis, gallstone pancreatitis, chronic thrombocytopenia, and cardiomyopathy presented with presumed vfib arrest s/p defibrillation with AED.  He was cooled and recovered well neurologically.  LHC showed severe 2 VD, no intervention given thrombocytopenia.  1. Cardiac arrest: Suspect ventricular fibrillation.   EF 40% by cardiac MRI.  The 95% mLAD stenosis noted at cath was probably an acute finding and may have been trigger for event.  Now s/p PCI mLAD and dRCA. Will gradually titrate up beta blocker.  Per EP, not ICD candidate at this time, so will hold off on Lifevest as no endpoint present (and he was revascularized). No driving x 6 months.  2. CAD: See cath report above.  Too high risk for CABG.   I did a cardiac MRI (see above) which showed a mixed picture for viability.  The mid to apical anteroseptal wall and the true apex probably are not viable. Mid LAD and distal RCA/proximal PDA PCI done on 10/28.  - Will repeat P2Y12 test in 1 week, could envision stopping ASA 81 given thrombocytopenia if platelets well-inhibited with Plavix.  - Added low dose statin given stable LFTs (atorvastatin 20).  3. Acute on chronic systolic CHF: EF 57-84%35-40% in 8/15.  EF 40%  by cardiac MRI 10/26. He remains volume overloaded on exam but improved. Will need further diuresis (probably 2 -3 days) prior to d/c. Switch to lasix 80 iv bid with metolazone.  - Continue current Coreg and spironolactone. - Continue lisinopril to 5 mg bid.  4. Cirrhosis: Related to prior heavy ETOH.  He has now quit drinking.  Transaminases have only been mildly elevated and were normal when last checked. Will repeat in am.  5. Chronic thrombocytopenia: Likely due to splenomegaly in the setting of cirrhosis.  Also possible ETOH-related marrow suppression.  See discussion above.  Platelets seem to have stabilized > 60K now. 6. Bicuspid aortic valve: Mild AS.  7. CKD: Creatinine stable.  8. Type II diabetes: Continue insulin.  9. H/o biliary pancreatitis and sepsis: Still has gallbladder, was deemed too high risk for surgery in 8/15.  10. Ambulate with PT and cardiac rehab.   Arvilla Meres MD 10/17/2014 1:54 PM

## 2014-10-17 NOTE — Progress Notes (Signed)
Physical Therapy Treatment Patient Details Name: Ralph BuckerRobert A Malinak MRN: [id number] DOB: Mar 17, 1961 Today's Date: 10/17/2014    History of Present Illness Pt adm with cardiac arrest. Underwent artic sun. Pt with resp failure and intubated and acute kidney injury. 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 Not operative candidate. May need life vest. PMH - ischemic cardiomyopathy EF 35-40%, cirrhosis, DM, lt toe amputation, peripheral neuropathy.    PT Comments    Pt progressing well towards physical therapy goals. Pt was able to ambulate with RW well. Pt was educated on progression of ambulation from walker to cane to no AD. Pt anxious to not use walker, however feel it is the safest option for him at this time. Pt anticipates d/c home this afternoon.   Follow Up Recommendations  Home health PT;Supervision/Assistance - 24 hour     Equipment Recommendations  3in1 (PT)    Recommendations for Other Services       Precautions / Restrictions Precautions Precautions: Fall Restrictions Weight Bearing Restrictions: No    Mobility  Bed Mobility               General bed mobility comments: Pt was sitting up in recliner chair upon PT arrival.   Transfers Overall transfer level: Needs assistance Equipment used: Rolling walker (2 wheeled) Transfers: Sit to/from Stand Sit to Stand: Supervision         General transfer comment: Supervision for safety. Pt stood to pull up pants (donned partly in sitting) and had slight LOB posteriorly. No assist required to recover.   Ambulation/Gait Ambulation/Gait assistance: Min guard;Supervision Ambulation Distance (Feet): 300 Feet Assistive device: Rolling walker (2 wheeled) Gait Pattern/deviations: Step-through pattern;Decreased stride length;Trunk flexed Gait velocity: decreased Gait velocity interpretation: Below normal speed for age/gender General Gait Details: Pt was able to ambulate with RW well and progressed to  supervision quickly. Pt was able to accomodate slight grade in hall well.   Stairs         General stair comments: Pt declined stair training this session.   Wheelchair Mobility    Modified Rankin (Stroke Patients Only)       Balance Overall balance assessment: Needs assistance Sitting-balance support: Feet supported;No upper extremity supported Sitting balance-Leahy Scale: Good     Standing balance support: No upper extremity supported;During functional activity Standing balance-Leahy Scale: Fair                      Cognition Arousal/Alertness: Awake/alert Behavior During Therapy: WFL for tasks assessed/performed Overall Cognitive Status: Within Functional Limits for tasks assessed                      Exercises      General Comments        Pertinent Vitals/Pain Pain Assessment: No/denies pain    Home Living                      Prior Function            PT Goals (current goals can now be found in the care plan section) Acute Rehab PT Goals Patient Stated Goal: Return home today. PT Goal Formulation: With patient Time For Goal Achievement: 10/20/14 Potential to Achieve Goals: Good Progress towards PT goals: Progressing toward goals    Frequency  Min 3X/week    PT Plan Current plan remains appropriate    Co-evaluation  End of Session   Activity Tolerance: Patient tolerated treatment well Patient left: in chair;with call bell/phone within reach     Time: 1140-1156 PT Time Calculation (min): 16 min  Charges:  $Gait Training: 8-22 mins                    G Codes:      Conni Slipper 10-28-2014, 12:23 PM  Conni Slipper, PT, DPT Acute Rehabilitation Services Pager: [phone]

## 2014-10-18 DIAGNOSIS — I4901 Ventricular fibrillation: Principal | ICD-10-CM

## 2014-10-18 LAB — GLUCOSE, CAPILLARY: Glucose-Capillary: 110 mg/dL — ABNORMAL HIGH (ref 70–99)

## 2014-10-18 LAB — CBC
HCT: 26.9 % — ABNORMAL LOW (ref 39.0–52.0)
Hemoglobin: 8.8 g/dL — ABNORMAL LOW (ref 13.0–17.0)
MCH: 30.2 pg (ref 26.0–34.0)
MCHC: 32.7 g/dL (ref 30.0–36.0)
MCV: 92.4 fL (ref 78.0–100.0)
PLATELETS: 67 10*3/uL — AB (ref 150–400)
RBC: 2.91 MIL/uL — ABNORMAL LOW (ref 4.22–5.81)
RDW: 16.4 % — AB (ref 11.5–15.5)
WBC: 3.2 10*3/uL — AB (ref 4.0–10.5)

## 2014-10-18 LAB — BASIC METABOLIC PANEL
ANION GAP: 10 (ref 5–15)
BUN: 28 mg/dL — ABNORMAL HIGH (ref 6–23)
CALCIUM: 8.5 mg/dL (ref 8.4–10.5)
CO2: 29 mEq/L (ref 19–32)
Chloride: 102 mEq/L (ref 96–112)
Creatinine, Ser: 1.37 mg/dL — ABNORMAL HIGH (ref 0.50–1.35)
GFR, EST AFRICAN AMERICAN: 67 mL/min — AB (ref >90)
GFR, EST NON AFRICAN AMERICAN: 58 mL/min — AB (ref >90)
Glucose, Bld: 118 mg/dL — ABNORMAL HIGH (ref 70–99)
Potassium: 4.6 mEq/L (ref 3.7–5.3)
SODIUM: 141 meq/L (ref 137–147)

## 2014-10-18 MED ORDER — ASPIRIN 81 MG PO CHEW: 81.0000 mg | CHEWABLE_TABLET | Freq: Every day | ORAL | Status: DC

## 2014-10-18 MED ORDER — LIDOCAINE 5 % EX PTCH: 1.0000 | MEDICATED_PATCH | CUTANEOUS | Status: DC

## 2014-10-18 MED ORDER — INSULIN GLARGINE 100 UNIT/ML ~~LOC~~ SOLN: 22.0000 [IU] | Freq: Every day | SUBCUTANEOUS | Status: DC

## 2014-10-18 MED ORDER — NITROGLYCERIN 0.4 MG SL SUBL: 0.4000 mg | SUBLINGUAL_TABLET | SUBLINGUAL | Status: DC | PRN

## 2014-10-18 MED ORDER — CLOPIDOGREL BISULFATE 75 MG PO TABS: 75.0000 mg | ORAL_TABLET | Freq: Every day | ORAL | Status: DC

## 2014-10-18 MED ORDER — LISINOPRIL 5 MG PO TABS: 5.0000 mg | ORAL_TABLET | Freq: Two times a day (BID) | ORAL | Status: DC

## 2014-10-18 MED ORDER — ATORVASTATIN CALCIUM 20 MG PO TABS: 20.0000 mg | ORAL_TABLET | Freq: Every day | ORAL | Status: DC

## 2014-10-18 MED ORDER — FUROSEMIDE 80 MG PO TABS: 80.0000 mg | ORAL_TABLET | Freq: Two times a day (BID) | ORAL | Status: DC

## 2014-10-18 MED ORDER — METOLAZONE 2.5 MG PO TABS: 2.5000 mg | ORAL_TABLET | Freq: Every day | ORAL | Status: DC

## 2014-10-18 MED ORDER — FUROSEMIDE 80 MG PO TABS
80.0000 mg | ORAL_TABLET | Freq: Two times a day (BID) | ORAL | Status: DC
  Filled 2014-10-18 (×2): qty 1

## 2014-10-18 MED ORDER — SPIRONOLACTONE 25 MG PO TABS: 25.0000 mg | ORAL_TABLET | Freq: Every day | ORAL | Status: DC

## 2014-10-18 MED ORDER — TRAMADOL HCL 50 MG PO TABS: 50.0000 mg | ORAL_TABLET | Freq: Four times a day (QID) | ORAL | Status: DC | PRN

## 2014-10-18 MED ORDER — CARVEDILOL 6.25 MG PO TABS: 6.2500 mg | ORAL_TABLET | Freq: Two times a day (BID) | ORAL | Status: DC

## 2014-10-18 NOTE — Discharge Instructions (Signed)
No driving for 6 months  No strenuous activity until seen at [facility]  Weigh daily Call [phone] if weight climbs more than 3 pounds in a day or 5 pounds in a week. No salt to very little salt in your diet.  No more than 2000 mg in a day. Call if increased shortness of breath or increased swelling.  Do not stop Plavix and aspirin, they are for your new drug eluding stents   Acute Coronary Syndrome  Acute coronary syndrome (ACS) is an urgent problem in which the blood and oxygen supply to the heart is critically deficient. ACS requires hospitalization because one or more coronary arteries may be blocked. ACS represents a range of conditions including:  Previous angina that is now unstable, lasts longer, happens at rest, or is more intense.  A heart attack, with heart muscle cell injury and death. There are three vital coronary arteries that supply the heart muscle with blood and oxygen so that it can pump blood effectively. If blockages to these arteries develop, blood flow to the heart muscle is reduced. If the heart does not get enough blood, angina may occur as the first warning sign. SYMPTOMS   The most common signs of angina include:  Tightness or squeezing in the chest.  Feeling of heaviness on the chest.  Discomfort in the arms, neck, back, or jaw.  Shortness of breath and nausea.  Cold, wet skin.  Angina is usually brought on by physical effort or excitement which increase the oxygen needs of the heart. These states increase the blood flow needs of the heart beyond what can be delivered.  Other symptoms that are not as common include:  Fatigue  Unexplained feelings of nervousness or anxiety  Weakness  Diarrhea  Sometimes, you may not have noticed any symptoms at all but still suffered a cardiac injury. TREATMENT   Medicines to help discomfort may include nitroglycerin (nitro) in the form of tablets or a spray for rapid relief, or longer-acting  forms such as cream, patches, or capsules. (Be aware that there are many side effects and possible interactions with other drugs).  Other medicines may be used to help the heart pump better.  Procedures to open blocked arteries including angioplasty or stent placement to keep the arteries open.  Open heart surgery may be needed when there are many blockages or they are in critical locations that are best treated with surgery. HOME CARE INSTRUCTIONS   Do not use any tobacco products including cigarettes, chewing tobacco, or electronic cigarettes.  Take one baby or adult aspirin daily, if your health care provider advises. This helps reduce the risk of a heart attack.  It is very important that you follow the angina treatment prescribed by your health care provider. Make arrangements for proper follow-up care.  Eat a heart healthy diet with salt and fat restrictions as advised.  Regular exercise is good for you as long as it does not cause discomfort. Do not begin any new type of exercise until you check with your health care provider.  If you are overweight, you should lose weight.  Try to maintain normal blood lipid levels.  Keep your blood pressure under control as recommended by your health care provider.  You should tell your health care provider right away about any increase in the severity or frequency of your chest discomfort or angina attacks. When you have angina, you should stop what you are doing and sit down. This may bring relief in  3 to 5 minutes. If your health care provider has prescribed nitro, take it as directed.  If your health care provider has given you a follow-up appointment, it is very important to keep that appointment. Not keeping the appointment could result in a chronic or permanent injury, pain, and disability. If there is any problem keeping the appointment, you must call back to this facility for assistance. SEEK IMMEDIATE MEDICAL CARE IF:   You develop  nausea, vomiting, or shortness of breath.  You feel faint, lightheaded, or pass out.  Your chest discomfort gets worse.  You are sweating or experience sudden profound fatigue.  You do not get relief of your chest pain after 3 doses of nitro.  Your discomfort lasts longer than 15 minutes. MAKE SURE YOU:   Understand these instructions.  Will watch your condition.  Will get help right away if you are not doing well or get worse.  Take all medicines as directed by your health care provider. Document Released: 12/05/2005 Document Revised: 12/10/2013 Document Reviewed: 04/08/2014 [facility] Patient Information 2015 [place], [place]. This information is not intended to replace advice given to you by your health care provider. Make sure you discuss any questions you have with your health care provider.

## 2014-10-18 NOTE — Discharge Summary (Addendum)
DISCHARGE SUMMARY  COSBY PROBY  MR#: [id number]  DOB:09/17/1961  Date of Admission: 10/02/2014 Date of Discharge: 10/18/2014  Attending Physician:Gregg Winchell T  Patient's WUJ:WJXBJYN,WGNFAO H, MD  Consults: Cardiology  EP  Nephrology  PMT  Disposition: D/C home   Follow-up Appts:     Follow-up Information   Follow up with [facility]. (Registered Nurse and Physical Therapy Services to start within 24 - 48 hours of discharge)    Contact information:   [address] [place] [address] [phone]       Follow up with [facility] On 10/23/2014. (@ 10:30 am; Heart Failure Clinic located in the hospital in [facility] and V[facility] Building; [address], Please bring all your medications to your visit. )    Specialty:  Cardiology   Contact information:   [address] [id number] [place] [address] [phone]      Follow up with [facility]. (3n1 bedside commode to be delivered to patient's room prior to discharge)    Contact information:   [address] [place] [address] [phone]       Follow up with Lewayne Bunting, MD On 11/17/2014. (at 10:15 Am)    Specialty:  Cardiology   Contact information:   [address] [place] [address] [phone]       Follow up with [name], MD. Schedule an appointment as soon as possible for a visit in 3 days.   Specialty:  Family Medicine   Contact information:   [address] 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 Pt suddenly collapsed, fell backward and hit his head. AED advised shockable rhythm. Approx 10 minutes CPR with ROSC before EMS arrival.   Hospital Course: Patient was admitted to [facility] service. He underwent hypothermic protocol, and was placed on pressors. Patient was also placed on CRRT due to high potassium. On 10/06/2014 he was extubated. Patient was evaluated by Nephrology during this hospitalization. His Acute kidney injury in setting of cardiac arrest resolved. By 10/07/2014 CVVH was discontinued. He was given Lasix.   On 10/10/2014 he underwent cardiac catheterization which showed severe three-vessel obstructive coronary artery disease but he did not undergo percutaneous intervention at that time due to thrombocytopenia. He was seen by cardiothoracic surgery but CABG was not considered a good option secondary to comorbidities. After his plt count improved he underwent cardiac cath on 10/28 and had PCI to LAD and RCA.   Cardiac arrest - suspected VFib - CAD -He was cooled and recovered well neurologically -EF 35-40% per echo 07/2014  -On  10/10/2014 patient undergoing cardiac catheterization which showed severe three-vessel obstructive coronary artery disease (80-90% proximal ramus, 90% mLAD, subtotalled dLAD, 90% D1, 90% dRCA/proxPDA, LVEDP 27). He did not undergo percutaneous intervention due to low platelets. Coronary artery bypass grafting not a good option given multiple comorbidities.  -95% mLAD stenosis noted at cath was probably an  acute finding and may have been trigger for event -Cardiac MRI (10/26) showed EF 40% with regional wall motion abnormalities, normal RV size and systolic function. Delayed enhancement suggested a mixed picture of viability. The mid-apical anteroseptal wall and true apex did not appear viable.  - EP consulted was consulted and he was felt to not be an ICD nor a life vest candidate.   -He underwent repeat cardiac cath on 10/28 w/ PCI to mLAD and dRCA/PDA with DES x 2 -No driving x 6 months per Cardiology  -repeat P2Y12 test in 1 week, could envision stopping ASA 81 given thrombocytopenia if platelets well-inhibited with Plavix  Acute on chronic systolic CHF  -care as per Cardiology:      - EF 35-40% in 8/15 - EF 40% by cardiac MRI 10/26      - lasix 80 bid with metolazone       - coreg and spironolactone       - lisinopril to 5 mg bid  Acute respiratory failure, post arrest  -resolved   Apical left pneumothax noted on CXR 10/15  -resolved   Bicuspid aortic valve  -Mild AS   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  -not felt to be a liver transplant candidate both due to multiple comorbidites - LFTs stable   Thrombocytopenia due to acute illness and cirrhosis  -plt count slowly rising - likely due to splenomegaly in the setting of cirrhosis - also possible ETOH-related marrow suppression - Platelets have stabilized > 60K   Anemia  -stable   DM type II  -Continue Lantus and SSI - CBG reasonably controlled   Bradycardia, junctional  -resolved with rewarming   Hyperkalemia  -resolved   AKI  -secondary to cardiac arrest  -improved but will need to be followed in setting of ongoing diuresis   Hx cholelithiasis  -deemed not to be a surgical candidate for cholecystectomy August 2015  Obesity - Body mass index is 34.13 kg/(m^2).     Medication List         acetaminophen 500 MG tablet  Commonly known as:  TYLENOL  Take 500 mg by mouth every 6 (six) hours as needed  for moderate pain.     aspirin 81 MG chewable tablet  Chew 1 tablet (81 mg total) by mouth daily.     atorvastatin 20 MG tablet  Commonly known as:  LIPITOR  Take 1 tablet (20 mg total) by mouth daily at 6 PM.     carvedilol 6.25 MG tablet  Commonly known as:  COREG  Take 1 tablet (6.25 mg total) by mouth 2 (two) times daily with a meal.     clopidogrel 75 MG tablet  Commonly known as:  PLAVIX  Take 1 tablet (75 mg total) by mouth daily.     furosemide 80 MG tablet  Commonly known as:  LASIX  Take 1 tablet (80 mg total) by mouth 2 (two) times daily.     glucagon 1 MG injection  Inject 1 mg into the vein once as needed. For hypoglycemia     insulin aspart 100 UNIT/ML injection  Commonly known as:  novoLOG  Inject 0-10 Units into the skin 3 (three) times daily before meals. Sliding scale     insulin glargine 100 UNIT/ML injection  Commonly known as:  LANTUS  - Inject 0.22 mLs (22 Units total) into the skin daily. Patient advised to take insulin in the morning after working nights;   - If working first or second shifts, takes insulin at bedtime     lidocaine 5 %  Commonly known as:  LIDODERM  Place 1 patch onto the skin daily. Remove & Discard patch within 12 hours or as directed by MD     lisinopril 5 MG tablet  Commonly known as:  PRINIVIL,ZESTRIL  Take 1 tablet (5 mg total) by mouth 2 (two) times daily.     metolazone 2.5 MG tablet  Commonly known as:  ZAROXOLYN  Take 1 tablet (2.5 mg total) by mouth daily.     nitroGLYCERIN 0.4 MG SL tablet  Commonly known as:  NITROSTAT  Place 1 tablet (0.4 mg total) under the tongue every 5 (five) minutes as needed for chest pain.     pantoprazole 40 MG tablet  Commonly known as:  PROTONIX  Take 1 tablet (40 mg total) by mouth daily.     ranitidine 150 MG tablet  Commonly known as:  ZANTAC  Take 150 mg by mouth daily as needed for heartburn.     spironolactone 25 MG tablet  Commonly known as:  ALDACTONE  Take 1 tablet  (25 mg total) by mouth daily.     traMADol 50 MG tablet  Commonly known as:  ULTRAM  Take 1 tablet (50 mg total) by mouth every 6 (six) hours as needed for moderate pain.       Day of Discharge BP 111/58  Pulse 64  Temp(Src) 98.1 F (36.7 C) (Oral)  Resp 18  Ht 5\' 10"  (1.778 m)  Wt 107.9 kg (237 lb 14 oz)  BMI 34.13 kg/m2  SpO2 94%  Physical Exam: General: No acute respiratory distress Lungs: Clear to auscultation bilaterally without wheezes or crackles Cardiovascular: Regular rate and rhythm without murmur gallop or rub  Abdomen: Nontender, morbidly obese, soft, bowel sounds positive, no rebound, no ascites, no appreciable mass Extremities: No significant cyanosis, clubbing;  1+ pedal edema bilateral lower extremities  Basic Metabolic Panel:  Recent Labs Lab 10/12/14 0812  10/14/14 0545 10/15/14 0552 10/16/14 0323 10/17/14 0355 10/18/14 0529  NA  --   < > 138 140 140 141 141  K  --   < > 4.4 4.3 4.4 4.3 4.6  CL  --   < > 99 100 103 103 102  CO2  --   < > 31 30 28 29 29   GLUCOSE  --   < > 82 115* 146* 141* 118*  BUN  --   < > 27* 29* 24* 24* 28*  CREATININE  --   < > 1.20 1.24 1.23 1.30 1.37*  CALCIUM  --   < > 8.8 8.6 8.4 8.8 8.5  MG 1.8  --   --   --   --   --   --   < > = values in this interval not displayed.  Liver Function Tests:  Recent Labs Lab 10/14/14 0545  AST 28  ALT 16  ALKPHOS 132*  BILITOT 1.1  PROT 5.4*  ALBUMIN 2.5*   CBC:  Recent Labs Lab 10/12/14 0812  10/14/14 0545 10/15/14 0552 10/16/14 0323 10/17/14 0355 10/18/14 0529  WBC 3.9*  < > 3.6*  3.9* 3.7* 3.3* 3.2*  NEUTROABS 3.0  --   --   --   --   --   --   HGB 9.2*  < > 9.3* 8.9* 8.4* 9.1* 8.8*  HCT 28.3*  < > 28.9* 27.8* 25.8* 27.5* 26.9*  MCV 90.1  < > 93.2 92.7 91.5 91.4 92.4  PLT 58*  < > 65* 61* 62* 66* 67*  < > = values in this interval not displayed.  BNP (last 3 results)  Recent Labs  10/14/14 0545 10/16/14 0323  PROBNP 14218.0* 16361.0*    CBG:  Recent  Labs Lab 10/17/14 0632 10/17/14 1230 10/17/14 1814 10/17/14 2052 10/18/14 0702  GLUCAP 112* 169* 158* 182* 110*    Time spent in discharge (includes decision making & examination of pt): >30 minutes  10/18/2014, 12:40 PM   Lonia Blood, MD [facility] Office  [phone] Pager [phone]  On-Call/Text Page:      [email or web address]      password [facility]

## 2014-10-18 NOTE — Progress Notes (Signed)
Patient ID: [name], male   DOB: May 23, 1961, 53 y.o.   MRN: [id number]   Patient Name: [name] Date of Encounter: 10/18/2014     Principal Problem:   Cardiogenic shock Active Problems:   Cardiac arrest   Anemia due to other cause   Acute respiratory failure with hypoxia   AKI (acute kidney injury)    SUBJECTIVE  No chest pain or sob. Edema improved.  CURRENT MEDS . aspirin  81 mg Oral Daily  . atorvastatin  20 mg Oral q1800  . carvedilol  6.25 mg Oral BID WC  . clopidogrel  75 mg Oral Daily  . feeding supplement (GLUCERNA SHAKE)  237 mL Oral BID BM  . furosemide  80 mg Intravenous BID  . insulin aspart  0-20 Units Subcutaneous TID AC  . insulin aspart  0-5 Units Subcutaneous QHS  . insulin glargine  16 Units Subcutaneous QHS  . lidocaine  1 patch Transdermal Q24H  . lisinopril  5 mg Oral BID  . metolazone  2.5 mg Oral Daily  . pantoprazole  40 mg Oral BID  . spironolactone  25 mg Oral Daily    OBJECTIVE  Filed Vitals:   10/17/14 2350 10/18/14 0419 10/18/14 0552 10/18/14 0726  BP: 103/52 106/56  111/58  Pulse: 69 68  64  Temp: 97.8 F (36.6 C) 98.1 F (36.7 C)  98.1 F (36.7 C)  TempSrc: Oral Oral  Oral  Resp: 16 18  18   Height:      Weight:   237 lb 14 oz (107.9 kg)   SpO2: 94% 92%  94%    Intake/Output Summary (Last 24 hours) at 10/18/14 0742 Last data filed at 10/18/14 0726  Gross per 24 hour  Intake   1620 ml  Output   3675 ml  Net  -2055 ml   Filed Weights   10/17/14 0035 10/17/14 0500 10/18/14 0552  Weight: 243 lb 2.7 oz (110.3 kg) 243 lb 2.7 oz (110.3 kg) 237 lb 14 oz (107.9 kg)    PHYSICAL EXAM  General: Pleasant, NAD. Neuro: Alert and oriented X 3. Moves all extremities spontaneously. Psych: Normal affect. HEENT:  Normal  Neck: Supple without bruits or JVD. Lungs:  Resp regular and unlabored, CTA. Heart: RRR no s3, s4, or murmurs. Abdomen: Soft, non-tender, non-distended, BS + x 4.  Extremities: No clubbing, cyanosis or  edema. DP/PT/Radials 2+ and equal bilaterally.  Accessory Clinical Findings  CBC  Recent Labs  10/17/14 0355 10/18/14 0529  WBC 3.3* 3.2*  HGB 9.1* 8.8*  HCT 27.5* 26.9*  MCV 91.4 92.4  PLT 66* 67*   Basic Metabolic Panel  Recent Labs  10/17/14 0355 10/18/14 0529  NA 141 141  K 4.3 4.6  CL 103 102  CO2 29 29  GLUCOSE 141* 118*  BUN 24* 28*  CREATININE 1.30 1.37*  CALCIUM 8.8 8.5   Liver Function Tests No results found for this basename: AST, ALT, ALKPHOS, BILITOT, PROT, ALBUMIN,  in the last 72 hours No results found for this basename: LIPASE, AMYLASE,  in the last 72 hours Cardiac Enzymes No results found for this basename: CKTOTAL, CKMB, CKMBINDEX, TROPONINI,  in the last 72 hours BNP No components found with this basename: POCBNP,  D-Dimer No results found for this basename: DDIMER,  in the last 72 hours Hemoglobin A1C No results found for this basename: HGBA1C,  in the last 72 hours Fasting Lipid Panel No results found for this basename: CHOL, HDL, LDLCALC, TRIG,  CHOLHDL, LDLDIRECT,  in the last 72 hours Thyroid Function Tests No results found for this basename: TSH, T4TOTAL, FREET3, T3FREE, THYROIDAB,  in the last 72 hours  TELE  NSR  Radiology/Studies  Ct Head Wo Contrast  10/02/2014   CLINICAL DATA:  53 year old male with history of trauma after after a fall during a syncopal episode during which the patient fell backward and struck his head. Status post CPR.  EXAM: CT HEAD WITHOUT CONTRAST  TECHNIQUE: Contiguous axial images were obtained from the base of the skull through the vertex without intravenous contrast.  COMPARISON:  No priors.  FINDINGS: No acute displaced skull fractures are identified. No acute intracranial abnormality. Specifically, no evidence of acute post-traumatic intracranial hemorrhage, no definite regions of acute/subacute cerebral ischemia, no focal mass, mass effect, hydrocephalus or abnormal intra or extra-axial fluid collections.  The visualized paranasal sinuses and mastoids are well pneumatized, with exception of a small air-fluid level in the posterior aspect of the left maxillary sinus and right sphenoid sinus.  IMPRESSION: 1. Small amount of soft tissue swelling in the occipital scalp, without underlying displaced skull fracture or acute intracranial abnormalities. 2. Small air-fluid level in the left maxillary sinus and right sphenoid sinus. This is nonspecific, but correlation for signs of sinusitis is recommended.   Electronically Signed   By: Trudie Reed M.D.   On: 10/02/2014 16:36   Dg Chest Port 1 View  10/09/2014   CLINICAL DATA:  History of cardiac arrest last week ; persistent chest soreness and shortness of breath ; history of CHF and diabetes  EXAM: PORTABLE CHEST - 1 VIEW  COMPARISON:  Portable chest x-ray of October 05, 2014  FINDINGS: The lung volumes remain low. The left hemidiaphragm remains obscured. There is partial obscuration of the right hemidiaphragm today. The cardiopericardial silhouette remains enlarged. The pulmonary vascularity is mildly engorged but has improved somewhat since the previous study. The trachea and esophagus have been extubated. A right internal jugular venous catheter tip projects over the midportion of the SVC.  IMPRESSION: Moderate CHF with bilateral pleural effusions and mild pulmonary vascular congestion. The findings are accentuated by hypoinflation. When the patient can tolerate the procedure, a PA and lateral chest x-ray with deep inspiration would be useful.   Electronically Signed   By: David  [place]   On: 10/09/2014 15:06   Dg Chest Port 1 View  10/05/2014   CLINICAL DATA:  Acute respiratory disease.  History of diabetes  EXAM: PORTABLE CHEST - 1 VIEW  COMPARISON:  10/04/2014  FINDINGS: There is a a right IJ catheter with tip in the projection of the SVC. The endotracheal tube tip is just above the carina. Nasogastric tube tip is in the stomach. The heart size appears  enlarged. No change and suspected left pleural effusion with overlying atelectasis/consolidation. Right lung is clear.  IMPRESSION: 1. Stable support apparatus. 2. No change in aeration to the left base.   Electronically Signed   By: Signa Kell M.D.   On: 10/05/2014 08:42   Dg Chest Port 1 View  10/04/2014   CLINICAL DATA:  Acute respiratory failure  EXAM: PORTABLE CHEST - 1 VIEW  COMPARISON:  Radiograph 10/03/2014  FINDINGS: Endotracheal tube, NG tube, right central venous line are unchanged. Stable cardiac silhouette. The is dense left basilar atelectasis unchanged. Mild right basilar airspace disease. No pneumothorax  IMPRESSION: 1. Stable support apparatus. 2. No change in dense left basilar atelectasis/airspace disease. 3. Mild airspace disease on the right. 4. Overall no  interval change   Electronically Signed   By: Genevive Bi M.D.   On: 10/04/2014 07:22   Dg Chest Port 1 View  10/03/2014   CLINICAL DATA:  Respiratory failure/hypoxia  EXAM: PORTABLE CHEST - 1 VIEW  COMPARISON:  October 02, 2014  FINDINGS: Endotracheal tube tip is 2.7 cm above the carina. Nasogastric tube tip and side port are below the diaphragm. Central catheter tip is in the superior vena cava. The questionable trace pneumothorax on the left seen 1 day prior is not appreciable on this examination. No pneumothorax is apparent currently.  There is persistent consolidation in the left lower lobe. Hazy opacity throughout much of the right lung is slightly less apparent currently. No new opacity is seen. Heart is enlarged with pulmonary vascularity within normal limits. No adenopathy.  IMPRESSION: Tube and catheter positions as described without appreciable pneumothorax. Infiltrate bilaterally, more on the right than on the left, is again noted. There is slightly less consolidation on the right and no appreciable change in the left lower lobe. No new opacity.   Electronically Signed   By: Bretta Bang M.D.   On: 10/03/2014  07:56   Dg Chest Port 1 View  10/02/2014   CLINICAL DATA:  Unsuccessful attempted left-sided central line placement. Subsequent encounter.  EXAM: PORTABLE CHEST - 1 VIEW  COMPARISON:  10/02/2014 (multiple examinations)  FINDINGS: Grossly unchanged enlarged cardiac silhouette and mediastinal contours. Stable positioning of support apparatus. Query trace left apical pneumothorax. Pulmonary vasculature remains indistinct with cephalization of flow with relative area of confluence about the right mid lung. Grossly unchanged left basilar/retrocardiac consolidative opacities. Trace of sided effusion is not excluded. Unchanged bones.  IMPRESSION: 1.  Stable positioning of support apparatus. 2. Query trace left apical pneumothorax. Continued attention on follow-up is recommended. 3. Similar findings of asymmetric pulmonary edema worse within the right mid and left lower lung. Critical Value/emergent results were called by telephone at the time of interpretation on 10/02/2014 at 10:23 pm to [facility] [place], who verbally acknowledged these results.   Electronically Signed   By: Simonne Come M.D.   On: 10/02/2014 22:24   Dg Chest Portable 1 View  10/02/2014   CLINICAL DATA:  Readjustment of endotracheal tube.  EXAM: PORTABLE CHEST - 1 VIEW  COMPARISON:  Same day.  FINDINGS: Stable cardiomediastinal silhouette. Endotracheal tube is now seen with distal tip 3 cm above the carina in grossly good position. Right internal jugular catheter remains with distal tip overlying expected position of the SVC. Stable bilateral lung opacities are noted. No pneumothorax or pleural effusion is noted.  IMPRESSION: Stable bilateral lung opacities. Endotracheal tube in grossly good position.   Electronically Signed   By: Roque Lias M.D.   On: 10/02/2014 17:04   Dg Chest Portable 1 View  10/02/2014   CLINICAL DATA:  Status post intubation and central line placement.  EXAM: PORTABLE CHEST - 1 VIEW  COMPARISON:  July 18, 2014.  FINDINGS:  Right internal jugular catheter line is noted with distal tip in expected position of the SVC. Endotracheal tube is directed in the right mainstem bronchus. It is recommended at be withdrawn approximately 4 cm. Diffuse airspace opacities are noted in the right lung in left upper lobe, as well as large left retrocardiac opacity concerning for bilateral pneumonia. No pneumothorax or significant pleural effusion is noted. Bony thorax is intact.  IMPRESSION: Significantly increased bilateral lung opacities are noted concerning for pneumonia or possibly edema. Endotracheal tube is directed in the  right mainstem bronchus; it is recommended to be withdrawn at least 4 cm. Critical Value/emergent results were called by telephone at the time of interpretation on 10/02/2014 at 4:15 pm to Dr. Zadie RhineNALD WICKLINE , who verbally acknowledged these results.   Electronically Signed   By: Roque LiasJames  Green M.D.   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  Normal bowel gas pattern.  No bowel dilatation or bowel wall thickening.  Osseous structures unremarkable.  No urinary tract calcification.  IMPRESSION: Normal bowel gas pattern.   Electronically Signed   By: Ulyses SouthwardMark  Boles M.D.   On: 10/07/2014 09:40   Dg Abd Portable 1v  10/02/2014   CLINICAL DATA:  OG tube insertion  EXAM: PORTABLE ABDOMEN - 1 VIEW  COMPARISON:  None.  FINDINGS: Enteric tube tip and side port projects over the expected location of the gastric fundus.  Paucity of bowel gas without definite evidence of obstruction. No supine evidence of pneumoperitoneum. No definite pneumatosis or portal venous gas.   Limited visualization the lower thorax demonstrates enlargement of the cardiac silhouette and epicardial pacer pads overlying the left ventricular apex.  IMPRESSION: Enteric tube tip and side port overlie the gastric fundus.   Electronically Signed   By: Simonne ComeJohn  Watts M.D.   On: 10/02/2014 22:29   Mr Card Morphology Wo/w Cm  10/13/2014   CLINICAL DATA:  History of cardiac arrest/VT, ischemic cardiomyopathy, assess for viability.  EXAM: CARDIAC MRI  TECHNIQUE: The patient was scanned on a 1.5 Tesla GE magnet. A dedicated cardiac coil was used. Functional imaging was done using Fiesta sequences. 2,3, and 4 chamber views were done to assess for RWMA's. Modified Simpson's rule using a short axis stack was used to calculate an ejection fraction on a dedicated work [employer] officer, trade [employer]station using Circle software. The patient received 37 cc of Multihance. After 10 minutes inversion recovery sequences were used to assess for infiltration and scar tissue.  CONTRAST:  37 mL Multihance  FINDINGS: Limited views of the lung fields showed bilateral small L>R pleural effusions. There was adjacent atelectasis on the left. There was a small pericardial effusion.  The left ventricle was moderately dilated. Normal wall thickness. There was akinesis of the mid to apical anteroseptal and inferoseptal walls, the true apex, the apical inferior wall, and the apical lateral wall. EF was calculated to be 40%. Normal right ventricular size and systolic function. Normal right and left atrial sizes. The aortic valve was bicuspid with probably mild aortic stenosis (this is better assessed by echo). The ascending aorta was not fully viewed but did not appear to be significantly dilated. There was no significant mitral stenosis noted.  On delayed enhancement imaging, there was 76-99% wall thickness subendocardial delayed enhancement in the mid to apical anteroseptal wall and in the apex. There did not appear to be significant delayed enhancement in other wall  segments.  MEASUREMENTS: LV EDV 284 mL  LV SV 115 mL  LV EF 40%  IMPRESSION: 1. Moderately dilated LV with EF 40%. Wall motion abnormalities as noted above.  2.  Normal RV size and systolic function.  3. Bicuspid aortic valve without significant dilatation of the ascending aorta. Probably mild aortic  stenosis.  4. Delayed enhancement pattern suggesting that the mid to apical anteroseptal wall and the apex are unlikely to improve significantly in function with revascularization (nonviable). There did not appear to be significant scar in the remainder of the LV wall segments.  Dalton Mclean   Electronically Signed   By: Marca Ancona M.D.   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 S/p PCI 3. Cirrhosis 4. Thrombocytopenia 5. Ischemic cardiomyopathy with scar by MRI Rec: From cardiac perspective he is ok for discharge. I will see him back in a month. He is currently not an ICD candidate but will reevaluate.  Calleen Alvis,M.D.  Gena Laski,M.D.  10/18/2014 7:42 AM

## 2014-10-18 NOTE — Progress Notes (Signed)
[facility]  [facility] is providing the following services: HHRN, HHPT  If patient discharges after hours, please call [phone].   Renard Hamper 10/18/2014, 2:36 PM

## 2014-10-20 ENCOUNTER — Encounter: Payer: Self-pay | Admitting: Family Medicine

## 2014-10-20 ENCOUNTER — Ambulatory Visit (INDEPENDENT_AMBULATORY_CARE_PROVIDER_SITE_OTHER): Payer: 59 | Admitting: Family Medicine

## 2014-10-20 VITALS — BP 94/60 | HR 62 | Temp 97.9°F | Ht 70.0 in | Wt 222.0 lb

## 2014-10-20 DIAGNOSIS — I255 Ischemic cardiomyopathy: Secondary | ICD-10-CM

## 2014-10-20 DIAGNOSIS — N179 Acute kidney failure, unspecified: Secondary | ICD-10-CM

## 2014-10-20 DIAGNOSIS — I5041 Acute combined systolic (congestive) and diastolic (congestive) heart failure: Secondary | ICD-10-CM

## 2014-10-20 DIAGNOSIS — I214 Non-ST elevation (NSTEMI) myocardial infarction: Secondary | ICD-10-CM

## 2014-10-20 DIAGNOSIS — D696 Thrombocytopenia, unspecified: Secondary | ICD-10-CM

## 2014-10-20 LAB — CBC WITH DIFFERENTIAL/PLATELET
BASOS PCT: 1.7 % (ref 0.0–3.0)
Basophils Absolute: 0.1 10*3/uL (ref 0.0–0.1)
EOS PCT: 3.5 % (ref 0.0–5.0)
Eosinophils Absolute: 0.1 10*3/uL (ref 0.0–0.7)
HCT: 31.4 % — ABNORMAL LOW (ref 39.0–52.0)
Hemoglobin: 10.2 g/dL — ABNORMAL LOW (ref 13.0–17.0)
Lymphocytes Relative: 14.6 % (ref 12.0–46.0)
Lymphs Abs: 0.6 10*3/uL — ABNORMAL LOW (ref 0.7–4.0)
MCHC: 32.6 g/dL (ref 30.0–36.0)
MCV: 91.6 fl (ref 78.0–100.0)
MONO ABS: 0.4 10*3/uL (ref 0.1–1.0)
MONOS PCT: 10.8 % (ref 3.0–12.0)
Neutro Abs: 2.8 10*3/uL (ref 1.4–7.7)
Neutrophils Relative %: 69.4 % (ref 43.0–77.0)
Platelets: 66 10*3/uL — ABNORMAL LOW (ref 150.0–400.0)
RBC: 3.43 Mil/uL — ABNORMAL LOW (ref 4.22–5.81)
RDW: 16.7 % — AB (ref 11.5–15.5)
WBC: 4 10*3/uL (ref 4.0–10.5)

## 2014-10-20 LAB — BASIC METABOLIC PANEL
BUN: 43 mg/dL — AB (ref 6–23)
CO2: 32 meq/L (ref 19–32)
Calcium: 8.6 mg/dL (ref 8.4–10.5)
Chloride: 97 mEq/L (ref 96–112)
Creatinine, Ser: 2.1 mg/dL — ABNORMAL HIGH (ref 0.4–1.5)
GFR: 35.79 mL/min — AB (ref >60.00)
GLUCOSE: 133 mg/dL — AB (ref 70–99)
Potassium: 4.7 mEq/L (ref 3.5–5.1)
SODIUM: 136 meq/L (ref 135–145)

## 2014-10-20 NOTE — Assessment & Plan Note (Signed)
Recent hospitalization for VFIB/cardiac arrest. He is now s/p DES to mid LAD and RCA, needs dual antiplatelet therapy x 1 yr (vs plavix alone if repeat P2Y12 testing shows that plavix metabolized normally). Repeat testing is to be done by his cardiologist. Currently pt is asymptomatic. He has plans for f/u with EP MD for ongoing consideration of ICD--something he is not a candidate for at this time.

## 2014-10-20 NOTE — Assessment & Plan Note (Signed)
Currently on Coreg 6.25mg  bid, lasix 80mg  bid, lisinopril 5mg  bid, aldactone 25mg  bid, and metolazone 2.5mg  qd (in addition to his anti-platelet meds). Has appt with CHF clinic later this week. Rechecking BMET today.

## 2014-10-20 NOTE — Assessment & Plan Note (Signed)
His baseline Cr may indeed be higher than pre-hospital value given his cardiogenic shock/AKI. Needs close f/u of K, Cr in setting of this recent injury + CRI PLUS diuresis increase due to CHF/cardiomyopathy. BMET today.

## 2014-10-20 NOTE — Assessment & Plan Note (Signed)
Acute-on-chronic: acute illness superimposed on cirrhosis with portal HTN/splenomegaly. Per pt he got several platelet transfusions in hosp. Recheck CBC today.

## 2014-10-20 NOTE — Progress Notes (Signed)
Pre visit review using our clinic review tool, if applicable. No additional management support is needed unless otherwise documented below in the visit note. 

## 2014-10-20 NOTE — Progress Notes (Signed)
OFFICE VISIT  10/20/2014   CC:  Chief Complaint  Patient presents with  . Hospitalization Follow-up   HPI:    Patient is a 53 y.o. Caucasian male who presents for hospital f/u: admitted to [facility] 10/15 to 10/18/14 for cardiac arrest/Vfib, resuscitated, supportive care for cardiogenic shock/resp failure in ICU, had acute oliguric renal failure from cardiogenic shock and required CRRT.  He eventually recovered enough to consider pursuing status of his coronaries, although he did require multiple platelet transfusions prior to his cath due to acute-on-chronic thrombocytopenia.  Cardiac MRI showed EF 40%.  Cath showed severe 3V CAD and he got DES to RCA and mid LAD.  He was evaluated by EP MD and determined to NOT be candidate for ICD or life-vest at this time.  His lasix dosing was increased from 40mg  bid pre-hosp to 80mg  bid and metolazone 2.5mg  qd was added in hosp and continued at d/c.  Also stayed on lisinopril 5mg  bid but aldactone dose decreased from 100 mg qd to 25 mg bid.  Coreg was increased from 3.125 mg bid to 6.25 mg bid.  He was set up as outpt with CHF clinic. His kidneys recovered nicely, due for repeat BMET today.  Denies exertional chest pain (he has some soreness in sternum from CPR), SOB, palpitations, or orthostatic dizziness.  He is fatigued, as expected.  No n/v/d.  No HA's.  No focal weakness.  Of note, he is currently utilizing short term disability.  He was told not to drive x 6 mo by cardiology.  Past Medical History  Diagnosis Date  . Heart murmur     ECHO 07/2011 showed mild AS and LVH.  TEE 09/2011 showed small PFO, bicuspid aortic valve but no stenosis or regurg.  [name]. 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  Gen surg and GI recommended flagyl and cipro x 2 wks..  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  .  Cholelithiasis     u/s--no cholecystitis.  06/2014 u/s showed no stones, only GB sludge  . Peripheral neuropathy     Diabetic:  Autonomic (pelvic) and LE polyneuropathy--WFUB testing showed that it is likely from DM  . Diabetic foot ulcers     Poor healing; normal ABIs and TBIs  . Arthritis     back L3 - L5  . Hyperactive gag reflex   . History of pyelonephritis   . Type II or unspecified type diabetes mellitus with neurological manifestations, not stated as uncontrolled     IDDM: neuro, renal, and ophth complications  . Wears dentures     upper  . Osteomyelitis of toe of left foot     left 4th and 5th toes--now s/p amputation of these areas  . Diabetic retinopathy associated with type 2 diabetes mellitus     Laser tx in both eyes (Dr. [name])  . Diabetic nephropathy     Proteinuria and CrCl 55-65  . Chronic renal insufficiency, stage III (moderate)     CrCl 50s-80s 2014 to pre-VFIB arrest 10/02/14, at which time he sustained AKI.  . Lumbar spondylosis     MRI 08/25/11 with multilevel facet dz, small disc protrusion at L5-S1 to the right without impingement  . Ischemic cardiomyopathy 07/22/14    Myoview w/out ischemia but large scar (inferior/apex) w/ global hypokinesis--medical mgmt  . History of pancreatitis 2014 and 2015  Suspected biliary: passing small gallstones: surgery declined to remove GB 07/2014.  . Aspiration pneumonia   . Thrombocytopenia   . Portal hypertension   . Liver cirrhosis secondary to NASH     ?+ alcohol?.  With hx of splenomegaly and ascities (dx approx 2012)  . History of gram negative sepsis 2015    e coli  . Ventricular fibrillation 10/02/14    Resuscitated, hospitalized, 3 V CAD detected, DES stent to mid LAD and to RCA were placed.    Past Surgical History  Procedure Laterality Date  . Knee arthroscopy  2001    Bilateral  . Amputation  10/24/2012    Procedure: AMPUTATION RAY;  Surgeon: Sherri Rad, MD;  Location: [facility];   Service: Orthopedics;  Laterality: Left;  left 4th toe amputation through MTP joint, 5th Ray amputation   . Knee surgery  2001  . Hida scan  06/2014    Normal  . Transthoracic echocardiogram  07/19/14    Mild LV dilation and hypertrophy, EF 35-40%, no wall motion abnormalities, grade II diast dysfxn, bicuspid aortic valve with mild AS and mild AI.  [name] Cardiovascular stress test  07/2014    Myoview w/out ischemia but large scar (inferior/apex) w/ global hypokinesis--medical mgmt  . Eye surgery  08/2014    Retinal hemorrhage evacuation  . Coronary angioplasty with stent placement  09/2014    DES to mid LAD and RCA; EF 40% by cardiac MRI  . Cardiac mri  09/2014    Moderately dilated LV.  EF 40%, multiple wall motion abnormalities, normal RV fxn.  Some areas of scarring.    Outpatient Prescriptions Prior to Visit  Medication Sig Dispense Refill  . acetaminophen (TYLENOL) 500 MG tablet Take 500 mg by mouth every 6 (six) hours as needed for moderate pain.    [name] aspirin 81 MG chewable tablet Chew 1 tablet (81 mg total) by mouth daily.    [name] atorvastatin (LIPITOR) 20 MG tablet Take 1 tablet (20 mg total) by mouth daily at 6 PM. 30 tablet 0  . carvedilol (COREG) 6.25 MG tablet Take 1 tablet (6.25 mg total) by mouth 2 (two) times daily with a meal. 60 tablet 0  . clopidogrel (PLAVIX) 75 MG tablet Take 1 tablet (75 mg total) by mouth daily. 30 tablet 0  . furosemide (LASIX) 80 MG tablet Take 1 tablet (80 mg total) by mouth 2 (two) times daily. 60 tablet 0  . glucagon 1 MG injection Inject 1 mg into the vein once as needed. For hypoglycemia 1 each 2  . insulin aspart (NOVOLOG) 100 UNIT/ML injection Inject 0-10 Units into the skin 3 (three) times daily before meals. Sliding scale    . insulin glargine (LANTUS) 100 UNIT/ML injection Inject 0.22 mLs (22 Units total) into the skin daily. Patient advised to take insulin in the morning after working nights;  If working first or second shifts, takes insulin at  bedtime 10 mL 11  . lidocaine (LIDODERM) 5 % Place 1 patch onto the skin daily. Remove & Discard patch within 12 hours or as directed by MD 30 patch 0  . lisinopril (PRINIVIL,ZESTRIL) 5 MG tablet Take 1 tablet (5 mg total) by mouth 2 (two) times daily. 60 tablet 0  . metolazone (ZAROXOLYN) 2.5 MG tablet Take 1 tablet (2.5 mg total) by mouth daily. 30 tablet 0  . nitroGLYCERIN (NITROSTAT) 0.4 MG SL tablet Place 1 tablet (0.4 mg total) under the tongue every 5 (five) minutes as  needed for chest pain. 25 tablet 0  . pantoprazole (PROTONIX) 40 MG tablet Take 1 tablet (40 mg total) by mouth daily. 30 tablet 3  . ranitidine (ZANTAC) 150 MG tablet Take 150 mg by mouth daily as needed for heartburn.    . spironolactone (ALDACTONE) 25 MG tablet Take 1 tablet (25 mg total) by mouth daily. 30 tablet 0  . traMADol (ULTRAM) 50 MG tablet Take 1 tablet (50 mg total) by mouth every 6 (six) hours as needed for moderate pain. 30 tablet 0   No facility-administered medications prior to visit.    Allergies  Allergen Reactions  . Iodine Other (See Comments)    Family history of anaphylaxis, unsure if patient has reaction  . Other Shortness Of Breath, Itching and Other (See Comments)    PERFUMES - SNEEZING  . Hydralazine Hcl Hives  . Naproxen Nausea And Vomiting    ROS As per HPI  PE: Blood pressure 94/60, pulse 62, temperature 97.9 F (36.6 C), temperature source Temporal, height 5\' 10"  (1.778 m), weight 222 lb (100.699 kg), SpO2 97 %. Gen: Alert, well appearing, walking with the assistance of 4 point walker today.  Patient is oriented to person, place, time, and situation. AFFECT: pleasant, lucid thought and speech. No icterus, no jaundice. CV: RRR, no obvious murmur.  No rub or gallop. Chest is clear, no wheezing or rales. Normal symmetric air entry throughout both lung fields.  EXT: 1+ pitting edema in both LE's.  No cyanosis or clubbing.  LABS:  None today. Cr at d/c was 1.37  IMPRESSION AND  PLAN:  NSTEMI (non-ST elevated myocardial infarction) Recent hospitalization for VFIB/cardiac arrest. He is now s/p DES to mid LAD and RCA, needs dual antiplatelet therapy x 1 yr (vs plavix alone if repeat P2Y12 testing shows that plavix metabolized normally). Repeat testing is to be done by his cardiologist. Currently pt is asymptomatic. He has plans for f/u with EP MD for ongoing consideration of ICD--something he is not a candidate for at this time.  Cardiomyopathy, ischemic Currently on Coreg 6.25mg  bid, lasix 80mg  bid, lisinopril 5mg  bid, aldactone 25mg  bid, and metolazone 2.5mg  qd (in addition to his anti-platelet meds). Has appt with CHF clinic later this week. Rechecking BMET today.  AKI (acute kidney injury) His baseline Cr may indeed be higher than pre-hospital value given his cardiogenic shock/AKI. Needs close f/u of K, Cr in setting of this recent injury + CRI PLUS diuresis increase due to CHF/cardiomyopathy. BMET today.  Thrombocytopenia Acute-on-chronic: acute illness superimposed on cirrhosis with portal HTN/splenomegaly. Per pt he got several platelet transfusions in hosp. Recheck CBC today.  An After Visit Summary was printed and given to the patient.  FOLLOW UP: No Follow-up on file.

## 2014-10-22 ENCOUNTER — Telehealth: Payer: Self-pay | Admitting: Internal Medicine

## 2014-10-22 NOTE — Telephone Encounter (Signed)
New Message  [name] with [facility] called to give BP readings    106/60 sittiing  82/50 standing   Having dizziness when he stands up. Please call back to discuss

## 2014-10-22 NOTE — Telephone Encounter (Signed)
I spoke with Meredith Staggers RN // heart failure clinic, pt will be seen their this week, I will forward to her to address.

## 2014-10-22 NOTE — Telephone Encounter (Signed)
Per Ulla Potash, NP have pt stop Metolazone and hold Lasix for 2 days, keep f/u as sch on Fri, attempted to call Pattie and left her a VM with instructions, called and spoke w/pt he is aware and agreeable and states his pcp stopped his metolazone on Mon

## 2014-10-23 ENCOUNTER — Encounter (HOSPITAL_COMMUNITY): Payer: 59

## 2014-10-24 ENCOUNTER — Ambulatory Visit (HOSPITAL_BASED_OUTPATIENT_CLINIC_OR_DEPARTMENT_OTHER)
Admission: RE | Admit: 2014-10-24 | Discharge: 2014-10-24 | Disposition: A | Discharge status: Home / Self Care | Payer: 59 | Source: Ambulatory Visit | Attending: Cardiology | Admitting: Cardiology

## 2014-10-24 ENCOUNTER — Encounter (HOSPITAL_COMMUNITY): Payer: 59

## 2014-10-24 ENCOUNTER — Encounter (HOSPITAL_BASED_OUTPATIENT_CLINIC_OR_DEPARTMENT_OTHER): Payer: Self-pay | Admitting: *Deleted

## 2014-10-24 ENCOUNTER — Other Ambulatory Visit: Payer: Self-pay

## 2014-10-24 ENCOUNTER — Telehealth (HOSPITAL_COMMUNITY): Payer: Self-pay | Admitting: *Deleted

## 2014-10-24 ENCOUNTER — Emergency Department (HOSPITAL_BASED_OUTPATIENT_CLINIC_OR_DEPARTMENT_OTHER): Payer: 59

## 2014-10-24 ENCOUNTER — Inpatient Hospital Stay (HOSPITAL_BASED_OUTPATIENT_CLINIC_OR_DEPARTMENT_OTHER)
Admission: EM | Admit: 2014-10-24 | Discharge: 2014-10-25 | DRG: 641 | Disposition: A | Discharge status: Home / Self Care | Payer: 59 | Attending: Internal Medicine | Admitting: Internal Medicine

## 2014-10-24 ENCOUNTER — Ambulatory Visit (HOSPITAL_COMMUNITY)
Admission: RE | Admit: 2014-10-24 | Discharge: 2014-10-24 | Disposition: A | Discharge status: Home / Self Care | Payer: 59 | Source: Ambulatory Visit | Attending: Cardiology | Admitting: Cardiology

## 2014-10-24 ENCOUNTER — Encounter (HOSPITAL_COMMUNITY): Payer: Self-pay

## 2014-10-24 VITALS — BP 104/60 | HR 64 | Wt 220.8 lb

## 2014-10-24 DIAGNOSIS — K766 Portal hypertension: Secondary | ICD-10-CM | POA: Diagnosis present

## 2014-10-24 DIAGNOSIS — M479 Spondylosis, unspecified: Secondary | ICD-10-CM | POA: Diagnosis present

## 2014-10-24 DIAGNOSIS — K7581 Nonalcoholic steatohepatitis (NASH): Secondary | ICD-10-CM | POA: Diagnosis present

## 2014-10-24 DIAGNOSIS — E11319 Type 2 diabetes mellitus with unspecified diabetic retinopathy without macular edema: Secondary | ICD-10-CM | POA: Diagnosis present

## 2014-10-24 DIAGNOSIS — I2510 Atherosclerotic heart disease of native coronary artery without angina pectoris: Secondary | ICD-10-CM | POA: Diagnosis present

## 2014-10-24 DIAGNOSIS — Z6830 Body mass index (BMI) 30.0-30.9, adult: Secondary | ICD-10-CM | POA: Diagnosis not present

## 2014-10-24 DIAGNOSIS — I255 Ischemic cardiomyopathy: Secondary | ICD-10-CM

## 2014-10-24 DIAGNOSIS — E669 Obesity, unspecified: Secondary | ICD-10-CM | POA: Diagnosis present

## 2014-10-24 DIAGNOSIS — Z888 Allergy status to other drugs, medicaments and biological substances status: Secondary | ICD-10-CM | POA: Diagnosis not present

## 2014-10-24 DIAGNOSIS — K7460 Unspecified cirrhosis of liver: Secondary | ICD-10-CM | POA: Diagnosis present

## 2014-10-24 DIAGNOSIS — D696 Thrombocytopenia, unspecified: Secondary | ICD-10-CM | POA: Diagnosis present

## 2014-10-24 DIAGNOSIS — J90 Pleural effusion, not elsewhere classified: Secondary | ICD-10-CM

## 2014-10-24 DIAGNOSIS — M1990 Unspecified osteoarthritis, unspecified site: Secondary | ICD-10-CM | POA: Diagnosis present

## 2014-10-24 DIAGNOSIS — I5023 Acute on chronic systolic (congestive) heart failure: Secondary | ICD-10-CM

## 2014-10-24 DIAGNOSIS — E875 Hyperkalemia: Secondary | ICD-10-CM | POA: Diagnosis not present

## 2014-10-24 DIAGNOSIS — K7030 Alcoholic cirrhosis of liver without ascites: Secondary | ICD-10-CM

## 2014-10-24 DIAGNOSIS — Z7982 Long term (current) use of aspirin: Secondary | ICD-10-CM | POA: Diagnosis not present

## 2014-10-24 DIAGNOSIS — N179 Acute kidney failure, unspecified: Secondary | ICD-10-CM

## 2014-10-24 DIAGNOSIS — E118 Type 2 diabetes mellitus with unspecified complications: Secondary | ICD-10-CM

## 2014-10-24 DIAGNOSIS — E1121 Type 2 diabetes mellitus with diabetic nephropathy: Secondary | ICD-10-CM | POA: Diagnosis present

## 2014-10-24 DIAGNOSIS — E869 Volume depletion, unspecified: Secondary | ICD-10-CM

## 2014-10-24 DIAGNOSIS — I952 Hypotension due to drugs: Secondary | ICD-10-CM

## 2014-10-24 DIAGNOSIS — N183 Chronic kidney disease, stage 3 (moderate): Secondary | ICD-10-CM | POA: Diagnosis present

## 2014-10-24 DIAGNOSIS — E11621 Type 2 diabetes mellitus with foot ulcer: Secondary | ICD-10-CM | POA: Diagnosis present

## 2014-10-24 DIAGNOSIS — Z794 Long term (current) use of insulin: Secondary | ICD-10-CM

## 2014-10-24 LAB — CBC
HEMATOCRIT: 34.3 % — AB (ref 39.0–52.0)
HEMOGLOBIN: 11.1 g/dL — AB (ref 13.0–17.0)
MCH: 30.1 pg (ref 26.0–34.0)
MCHC: 32.4 g/dL (ref 30.0–36.0)
MCV: 93 fL (ref 78.0–100.0)
Platelets: 69 10*3/uL — ABNORMAL LOW (ref 150–400)
RBC: 3.69 MIL/uL — ABNORMAL LOW (ref 4.22–5.81)
RDW: 14.9 % (ref 11.5–15.5)
WBC: 5.9 10*3/uL (ref 4.0–10.5)

## 2014-10-24 LAB — COMPREHENSIVE METABOLIC PANEL
ALK PHOS: 242 U/L — AB (ref 39–117)
ALT: 16 U/L (ref 0–53)
ANION GAP: 15 (ref 5–15)
AST: 28 U/L (ref 0–37)
Albumin: 3.5 g/dL (ref 3.5–5.2)
BUN: 74 mg/dL — AB (ref 6–23)
CO2: 26 mEq/L (ref 19–32)
Calcium: 9.6 mg/dL (ref 8.4–10.5)
Chloride: 96 mEq/L (ref 96–112)
Creatinine, Ser: 2.25 mg/dL — ABNORMAL HIGH (ref 0.50–1.35)
GFR calc non Af Amer: 32 mL/min — ABNORMAL LOW (ref >90)
GFR, EST AFRICAN AMERICAN: 37 mL/min — AB (ref >90)
Glucose, Bld: 116 mg/dL — ABNORMAL HIGH (ref 70–99)
POTASSIUM: 7.2 meq/L — AB (ref 3.7–5.3)
Sodium: 137 mEq/L (ref 137–147)
TOTAL PROTEIN: 6.9 g/dL (ref 6.0–8.3)
Total Bilirubin: 1 mg/dL (ref 0.3–1.2)

## 2014-10-24 LAB — CBC WITH DIFFERENTIAL/PLATELET
Basophils Absolute: 0.1 10*3/uL (ref 0.0–0.1)
Basophils Relative: 2 % — ABNORMAL HIGH (ref 0–1)
Eosinophils Absolute: 0.2 10*3/uL (ref 0.0–0.7)
Eosinophils Relative: 3 % (ref 0–5)
HCT: 31.2 % — ABNORMAL LOW (ref 39.0–52.0)
Hemoglobin: 10.2 g/dL — ABNORMAL LOW (ref 13.0–17.0)
LYMPHS ABS: 0.5 10*3/uL — AB (ref 0.7–4.0)
Lymphocytes Relative: 11 % — ABNORMAL LOW (ref 12–46)
MCH: 30.5 pg (ref 26.0–34.0)
MCHC: 32.7 g/dL (ref 30.0–36.0)
MCV: 93.4 fL (ref 78.0–100.0)
MONO ABS: 0.4 10*3/uL (ref 0.1–1.0)
MONOS PCT: 8 % (ref 3–12)
NEUTROS ABS: 3.5 10*3/uL (ref 1.7–7.7)
Neutrophils Relative %: 75 % (ref 43–77)
Platelets: 62 10*3/uL — ABNORMAL LOW (ref 150–400)
RBC: 3.34 MIL/uL — ABNORMAL LOW (ref 4.22–5.81)
RDW: 14.6 % (ref 11.5–15.5)
WBC: 4.7 10*3/uL (ref 4.0–10.5)

## 2014-10-24 LAB — BASIC METABOLIC PANEL
Anion gap: 16 — ABNORMAL HIGH (ref 5–15)
BUN: 74 mg/dL — AB (ref 6–23)
CHLORIDE: 94 meq/L — AB (ref 96–112)
CO2: 25 meq/L (ref 19–32)
Calcium: 9.2 mg/dL (ref 8.4–10.5)
Creatinine, Ser: 2.3 mg/dL — ABNORMAL HIGH (ref 0.50–1.35)
GFR calc non Af Amer: 31 mL/min — ABNORMAL LOW (ref >90)
GFR, EST AFRICAN AMERICAN: 36 mL/min — AB (ref >90)
GLUCOSE: 123 mg/dL — AB (ref 70–99)
POTASSIUM: 5.8 meq/L — AB (ref 3.7–5.3)
Sodium: 135 mEq/L — ABNORMAL LOW (ref 137–147)

## 2014-10-24 LAB — CBG MONITORING, ED: Glucose-Capillary: 121 mg/dL — ABNORMAL HIGH (ref 70–99)

## 2014-10-24 LAB — PRO B NATRIURETIC PEPTIDE: PRO B NATRI PEPTIDE: 5640 pg/mL — AB (ref 0–125)

## 2014-10-24 LAB — TROPONIN I

## 2014-10-24 LAB — POTASSIUM: Potassium: 5.2 mEq/L (ref 3.7–5.3)

## 2014-10-24 LAB — APTT: aPTT: 31 seconds (ref 24–37)

## 2014-10-24 LAB — GLUCOSE, CAPILLARY: Glucose-Capillary: 178 mg/dL — ABNORMAL HIGH (ref 70–99)

## 2014-10-24 LAB — PLATELET INHIBITION P2Y12: Platelet Function  P2Y12: 241 [PRU] (ref 194–418)

## 2014-10-24 MED ORDER — DEXTROSE 50 % IV SOLN: 25.0000 mL | Freq: Once | INTRAVENOUS | Status: AC | PRN

## 2014-10-24 MED ORDER — PANTOPRAZOLE SODIUM 40 MG PO TBEC
40.0000 mg | DELAYED_RELEASE_TABLET | Freq: Every day | ORAL | Status: DC
Start: 2014-10-25 — End: 2014-10-25
  Administered 2014-10-25: 40 mg via ORAL
  Filled 2014-10-24: qty 1

## 2014-10-24 MED ORDER — SODIUM CHLORIDE 0.9 % IV BOLUS (SEPSIS)
500.0000 mL | Freq: Once | INTRAVENOUS | Status: AC
  Administered 2014-10-24: 500 mL via INTRAVENOUS

## 2014-10-24 MED ORDER — SODIUM CHLORIDE 0.9 % IV SOLN
INTRAVENOUS | Status: DC
  Administered 2014-10-24: 21:00:00 via INTRAVENOUS

## 2014-10-24 MED ORDER — SODIUM BICARBONATE 8.4 % IV SOLN
50.0000 meq | Freq: Once | INTRAVENOUS | Status: AC
  Administered 2014-10-24: 50 meq via INTRAVENOUS
  Filled 2014-10-24: qty 50

## 2014-10-24 MED ORDER — DEXTROSE 50 % IV SOLN: 50.0000 mL | Freq: Once | INTRAVENOUS | Status: AC | PRN

## 2014-10-24 MED ORDER — ASPIRIN 81 MG PO CHEW
81.0000 mg | CHEWABLE_TABLET | Freq: Every day | ORAL | Status: DC
  Administered 2014-10-25: 81 mg via ORAL
  Filled 2014-10-24 (×2): qty 1

## 2014-10-24 MED ORDER — DEXTROSE 5 % IV SOLN: INTRAVENOUS | Status: DC

## 2014-10-24 MED ORDER — DEXTROSE 50 % IV SOLN
1.0000 | Freq: Once | INTRAVENOUS | Status: DC
  Filled 2014-10-24: qty 50

## 2014-10-24 MED ORDER — LISINOPRIL 5 MG PO TABS: 2.5000 mg | ORAL_TABLET | Freq: Two times a day (BID) | ORAL | Status: DC

## 2014-10-24 MED ORDER — INSULIN GLARGINE 100 UNIT/ML ~~LOC~~ SOLN
22.0000 [IU] | Freq: Every day | SUBCUTANEOUS | Status: DC
  Administered 2014-10-25: 22 [IU] via SUBCUTANEOUS
  Filled 2014-10-24: qty 0.22

## 2014-10-24 MED ORDER — ONDANSETRON HCL 4 MG/2ML IJ SOLN
4.0000 mg | Freq: Once | INTRAMUSCULAR | Status: AC
  Administered 2014-10-24: 4 mg via INTRAVENOUS

## 2014-10-24 MED ORDER — SODIUM POLYSTYRENE SULFONATE 15 GM/60ML PO SUSP
30.0000 g | Freq: Once | ORAL | Status: AC
  Administered 2014-10-24: 30 g via ORAL
  Filled 2014-10-24: qty 120

## 2014-10-24 MED ORDER — ONDANSETRON HCL 4 MG/2ML IJ SOLN
INTRAMUSCULAR | Status: AC
  Filled 2014-10-24: qty 2

## 2014-10-24 MED ORDER — HYDROMORPHONE HCL 1 MG/ML IJ SOLN: 0.5000 mg | INTRAMUSCULAR | Status: DC | PRN

## 2014-10-24 MED ORDER — SODIUM CHLORIDE 0.9 % IJ SOLN
3.0000 mL | Freq: Two times a day (BID) | INTRAMUSCULAR | Status: DC
Start: 2014-10-24 — End: 2014-10-25
  Administered 2014-10-24: 3 mL via INTRAVENOUS

## 2014-10-24 MED ORDER — ACETAMINOPHEN 650 MG RE SUPP: 650.0000 mg | Freq: Four times a day (QID) | RECTAL | Status: DC | PRN

## 2014-10-24 MED ORDER — SODIUM BICARBONATE 8.4 % IV SOLN
INTRAVENOUS | Status: AC
  Filled 2014-10-24: qty 50

## 2014-10-24 MED ORDER — ATORVASTATIN CALCIUM 20 MG PO TABS
20.0000 mg | ORAL_TABLET | Freq: Every day | ORAL | Status: DC
  Administered 2014-10-24: 20 mg via ORAL
  Filled 2014-10-24 (×2): qty 1

## 2014-10-24 MED ORDER — FUROSEMIDE 80 MG PO TABS: 40.0000 mg | ORAL_TABLET | Freq: Two times a day (BID) | ORAL | Status: DC

## 2014-10-24 MED ORDER — INSULIN ASPART 100 UNIT/ML ~~LOC~~ SOLN
0.0000 [IU] | SUBCUTANEOUS | Status: DC
  Administered 2014-10-25: 7 [IU] via SUBCUTANEOUS
  Administered 2014-10-25: 1 [IU] via SUBCUTANEOUS

## 2014-10-24 MED ORDER — ACETAMINOPHEN 325 MG PO TABS: 650.0000 mg | ORAL_TABLET | Freq: Four times a day (QID) | ORAL | Status: DC | PRN

## 2014-10-24 MED ORDER — INSULIN ASPART 100 UNIT/ML IV SOLN
10.0000 [IU] | Freq: Once | INTRAVENOUS | Status: AC
  Administered 2014-10-24: 14:00:00 via INTRAVENOUS
  Filled 2014-10-24: qty 0.1

## 2014-10-24 MED ORDER — CARVEDILOL 6.25 MG PO TABS
6.2500 mg | ORAL_TABLET | Freq: Two times a day (BID) | ORAL | Status: DC
  Administered 2014-10-25: 6.25 mg via ORAL
  Filled 2014-10-24 (×3): qty 1

## 2014-10-24 MED ORDER — CLOPIDOGREL BISULFATE 75 MG PO TABS
75.0000 mg | ORAL_TABLET | Freq: Every day | ORAL | Status: DC
  Administered 2014-10-25: 75 mg via ORAL
  Filled 2014-10-24: qty 1

## 2014-10-24 MED ORDER — INSULIN ASPART 100 UNIT/ML ~~LOC~~ SOLN
SUBCUTANEOUS | Status: AC
  Filled 2014-10-24: qty 1

## 2014-10-24 MED ORDER — OXYCODONE HCL 5 MG PO TABS: 5.0000 mg | ORAL_TABLET | ORAL | Status: DC | PRN

## 2014-10-24 MED ORDER — INSULIN REGULAR BOLUS VIA INFUSION
10.0000 [IU] | Freq: Once | INTRAVENOUS | Status: AC
  Administered 2014-10-24: 10 [IU] via INTRAVENOUS
  Filled 2014-10-24: qty 10

## 2014-10-24 NOTE — Progress Notes (Signed)
Patient ID: [name], male   DOB: Apr 23, 1961, 53 y.o.   MRN: [id number] PCP: Dr. [name]  53 yo with history of CAD s/p recent PCI, cirrhosis with thrombocytopenia, recent cardiac arrest, and ischemic cardiomyopathy presents for cardiology followup.  Patient was admitted in 7/15-8/15 with gallstone pancreatitis.  It was decided not to do a cholecystectomy due to high surgical risk.  He was sent home and went back to work.  In 10/15, he had a cardiac arrest at work and was defibrillated by AED.  He was cooled and recovered well.  Platelets were initially very low but recovered to his baseline in the 60K range.  Cardiac cath was done showing severe 2 vessel disease.  Cardiac MRI was done, showing EF 40% with a mixed viability picture.  With recovery of platelets, he had Xience DES to the LAD and the distal RCA.  He had acute on chronic systolic CHF with volume overload and was diuresed and eventually discharge.  Unfortunately, he was sent home on daily metolazone.    Patient was seen by PCP not long after discharge and BMET was done, showing AKI in the setting of daily metolazone.  Metolazone was stopped and Lasix decreased.  Spironolactone and lisinopril were continued.  Today, patient says that he is getting lightheaded with rapid standing.  No chest pain. He has lost a lot of weight.  No edema now.  He is not getting short of breath with exertion.  No problems with bleeding. He is doing home PT.  BP is 104/60.   Labs were drawn today.  K is up to 7.2 and creatinine to 2.25.  I have called the patient and asked him to go to the ER.   Labs (11/15): K 4.7 => 7.2, creatinine 1.37 => 2.1 => 2.25 with BUN 94, HCT 26.9, plts 67  ECG: NSR, old anterior MI  PMH: 1. 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  2. Thrombocytopenia: Chronic.  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   3. Gallstone pancreatitis: He did not have cholecystectomy as deemed too high a surgical risk.   4. CKD 5. Type II diabetes 6. Bicuspid aortic valve: With mild AS and mild AI on last echo in 8/15.  7. H/o perforated diverticulum.  8. CAD: Cardiac arrest 10/15.  LHC (10/15) with 80-90% proximal ramus stenosis, 90% mLAD, subtotaled distal LAD, 90% D1, 90% dRCA/proximal PDA.  Patient had Xience DES to mLAD, dRCA.  9. Cardiac arrest: 10/15, defibrillated with AED and cooled.  10. Ischemic cardiomyopathy: Echo (8/15) with EF 35-40%, bicuspid aortic valve with mild AS/AI.  Cardiac MRI (10/15) with EF 40%, regional wall motion abnormalities, normal RV, delayed enhancement pattern suggesting mixed picture of viability.   SH: Never smoked.  Prior ETOH abuse, now stopped.  Works at [employer] and [employer].  Lives with sister in [place].   FH: No premature CAD.   ROS: All systems reviewed and negative except as per HPI.   Current Outpatient Prescriptions  Medication Sig Dispense Refill  . acetaminophen (TYLENOL) 500 MG tablet Take 500 mg by mouth every 6 (six) hours as needed for moderate pain.    [name] aspirin 81 MG chewable tablet Chew 1 tablet (81 mg total) by mouth daily.    [name] atorvastatin (LIPITOR) 20 MG tablet Take 1 tablet (20 mg total) by mouth daily at 6 PM. 30 tablet 0  . carvedilol (COREG) 6.25 MG tablet Take 1 tablet (6.25 mg total) by mouth 2 (two) times  daily with a meal. 60 tablet 0  . clopidogrel (PLAVIX) 75 MG tablet Take 1 tablet (75 mg total) by mouth daily. 30 tablet 0  . furosemide (LASIX) 80 MG tablet Take 0.5 tablets (40 mg total) by mouth 2 (two) times daily. 60 tablet 0  . glucagon 1 MG injection Inject 1 mg into the vein once as needed. For hypoglycemia 1 each 2  . insulin aspart (NOVOLOG) 100 UNIT/ML injection Inject 0-10 Units into the skin 3 (three) times daily before meals. Sliding scale    . insulin glargine (LANTUS) 100 UNIT/ML injection Inject 0.22 mLs (22 Units total) into the skin daily. Patient advised to take insulin in the morning after working nights;  If working  first or second shifts, takes insulin at bedtime 10 mL 11  . lidocaine (LIDODERM) 5 % Place 1 patch onto the skin daily. Remove & Discard patch within 12 hours or as directed by MD 30 patch 0  . lisinopril (PRINIVIL,ZESTRIL) 5 MG tablet Take 0.5 tablets (2.5 mg total) by mouth 2 (two) times daily. 60 tablet 0  . nitroGLYCERIN (NITROSTAT) 0.4 MG SL tablet Place 1 tablet (0.4 mg total) under the tongue every 5 (five) minutes as needed for chest pain. 25 tablet 0  . pantoprazole (PROTONIX) 40 MG tablet Take 1 tablet (40 mg total) by mouth daily. 30 tablet 3  . ranitidine (ZANTAC) 150 MG tablet Take 150 mg by mouth daily as needed for heartburn.    . spironolactone (ALDACTONE) 25 MG tablet Take 1 tablet (25 mg total) by mouth daily. 30 tablet 0  . traMADol (ULTRAM) 50 MG tablet Take 1 tablet (50 mg total) by mouth every 6 (six) hours as needed for moderate pain. 30 tablet 0   No current facility-administered medications for this encounter.   BP 104/60 mmHg  Pulse 64  Wt 220 lb 12.8 oz (100.154 kg)  SpO2 97% General: NAD Neck: No JVD, no thyromegaly or thyroid nodule.  Lungs: Decreased breath sounds at bases bilaterally.  CV: Nondisplaced PMI.  Heart regular S1/S2, no S3/S4, 2/6 early SEM RUSB.  No peripheral edema.  No carotid bruit.  Normal pedal pulses.  Abdomen: Soft, nontender, no hepatosplenomegaly, no distention.  Skin: Intact without lesions or rashes.  Neurologic: Alert and oriented x 3.  Psych: Normal affect. Extremities: No clubbing or cyanosis.  HEENT: Normal.   Assessment/Plan: 1. Chronic systolic CHF: EF 96% by cardiac MRI, ischemic cardiomyopathy.  He is not volume overloaded on exam, probably somewhat dry.  He was sent home from the hospital on daily metolazone 2.5 mg daily with Lasix 80 mg bid.  He has been off metolazone for several days but continued spironolactone and lisinopril so now is hyperkalemic as well as in renal failure.  - Stay off metolazone and hold Lasix until  further notice.  - Stop lisinopril and spironolactone for now, would like to restart eventually.  - Continue current Coreg.  - Will need echo in 3 months, if EF < 35% will need ICD (hopefully will not be).  2. CAD: Status post Xience DES to LAD and RCA after cardiac arrest.  Will need to follow closely due to bleeding risk on DAPT with chronic thrombocytopenia.  His platelets recently have been stable in the 60K range.  - Continue statin, transaminases have not been elevated.  - Continue ASA 81 and Plavix for now.  Will sent P2Y12 test, and if platelets are well-inhibited, will stop aspirin and leave him on single agent Plavix.  3. Cirrhosis: Likely related to ETOH.  Has associated splenomegaly and chronic thrombocytopenia.  4. Thrombocytopenia: Chronic, likely due to splenomegaly. Will need to follow plts/hemoglobin over time closely.  5. AKI/hyperkalemia: Unfortunately this is likely iatrogenic based on patient going home on daily metolazone.  He stopped this a couple of days ago but kept taking lisinopril and spironolactone.  K is now very high and not hemolyzed.   - We have asked him to proceed to the ER for treatment of hyperkalemia.  - He will stop Lasix, lisinopril, and spironolactone for now and followup with us in the office next week to make a decision on what do with his meds.   Marca AnconaDalton Keora Eccleston 10/24/2014 12:52 PM

## 2014-10-24 NOTE — Telephone Encounter (Signed)
Received call from lab, pt's K was 7.2 per Dr Shirlee Latch pt needs to report to ER and hold Lisinopril, Cleda Daub and Lasix for now sch f/u with [place] early next week, called and spoke w/pt he is aware and agreeable he will go to [facility] as it is closer to him, f/u appt sch for 11/9 at 3:20

## 2014-10-24 NOTE — ED Notes (Signed)
[facility] contacted for transport.

## 2014-10-24 NOTE — Patient Instructions (Signed)
Decrease Lisinopril 2.5 mg twice daily Lasix 40 mg twice daily--Do not begin taking until Sunday No more Metalozone Return for follow- up in 2 weeks to the [facility] You have been referred for a dietician consult

## 2014-10-24 NOTE — ED Notes (Signed)
No ETA for [facility] at this time, pt updated on plan of care.

## 2014-10-24 NOTE — ED Notes (Signed)
EDP made aware of abnormal lab results and vital signs. No new orders noted.

## 2014-10-24 NOTE — H&P (Addendum)
[facility] Admission History and Physical       RONY HOEFER YQM:[id number] DOB: 1961/01/30 DOA: 10/24/2014  Referring physician: EDP PCP: [name], MD  Specialists:   Chief Complaint: Elevated Potassium Level  HPI: BOONE CUEBAS is a 53 y.o. male with a history of NASH, CAD with recent PCI and Stents to LAD and RCA who was seen for follow-up in his PCP's office this and and had labs done which returned with an elevated potassium level.  He was contacted and instructed to go to the nearest ED for evaluation.    He was seen at [facility] and  A repeat potassium level was done and found to be 7.2, he was administered medications including Kayexalate and referred for admission.       Review of Systems:  Constitutional: No Weight Loss, No Weight Gain, Night Sweats, Fevers, Chills, Dizziness, Fatigue, or Generalized Weakness HEENT: No Headaches, Difficulty Swallowing,Tooth/Dental Problems,Sore Throat,  No Sneezing, Rhinitis, Ear Ache, Nasal Congestion, or Post Nasal Drip,  Cardio-vascular:  No Chest pain, Orthopnea, PND, Edema in Lower Extremities, Anasarca, Dizziness, Palpitations  Resp: No Dyspnea, No DOE, No Productive Cough, No Non-Productive Cough, No Hemoptysis, No Wheezing.    GI: No Heartburn, Indigestion, Abdominal Pain, Nausea, Vomiting, Diarrhea, Hematemesis, Hematochezia, Melena, Change in Bowel Habits,  Loss of Appetite  GU: No Dysuria, Change in Color of Urine, No Urgency or Frequency, No Flank pain.  Musculoskeletal: No Joint Pain or Swelling, No Decreased Range of Motion, No Back Pain.  Neurologic: No Syncope, No Seizures, Muscle Weakness, Paresthesia, Vision Disturbance or Loss, No Diplopia, No Vertigo, No Difficulty Walking,  Skin: No Rash or Lesions. Psych: No Change in Mood or Affect, No Depression or Anxiety, No Memory loss, No Confusion, or Hallucinations   Past Medical History  Diagnosis Date  . Heart murmur     ECHO 07/2011 showed mild AS and LVH.   TEE 09/2011 showed small PFO, bicuspid aortic valve but no stenosis or regurg.  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  Gen surg and GI recommended flagyl and cipro x 2 wks..  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  . Cholelithiasis     u/s--no cholecystitis.  06/2014 u/s showed no stones, only GB sludge  . Peripheral neuropathy     Diabetic:  Autonomic (pelvic) and LE polyneuropathy--[facility] testing showed that it is likely from DM  . Diabetic foot ulcers     Poor healing; normal ABIs and TBIs  . Arthritis     back L3 - L5  . Hyperactive gag reflex   . History of pyelonephritis   . Type II or unspecified type diabetes mellitus with neurological manifestations, not stated as uncontrolled     IDDM: neuro, renal, and ophth complications  . Wears dentures     upper  . Osteomyelitis of toe of left foot     left 4th and 5th toes--now s/p amputation of these areas  . Diabetic retinopathy associated with type 2 diabetes mellitus     Laser tx in both eyes (Dr. Allyne Gee)  . Diabetic nephropathy     Proteinuria and CrCl 55-65  . Chronic renal insufficiency, stage III (moderate)     CrCl 50s-80s 2014 to pre-VFIB arrest 10/02/14, at which time he sustained AKI.  . Lumbar spondylosis     MRI 08/25/11 with  multilevel facet dz, small disc protrusion at L5-S1 to the right without impingement  . Ischemic cardiomyopathy 07/22/14    Myoview w/out ischemia but large scar (inferior/apex) w/ global hypokinesis--medical mgmt  . History of pancreatitis 2014 and 2015    Suspected biliary: passing small gallstones: surgery declined to remove GB 07/2014.  . Aspiration pneumonia   . Thrombocytopenia   . Portal hypertension   . Liver cirrhosis secondary to NASH     ?+ alcohol?.  With hx of splenomegaly and ascities (dx approx 2012)  . History of gram  negative sepsis 2015    e coli  . Ventricular fibrillation 10/02/14    Resuscitated, hospitalized, 3 V CAD detected, DES stent to mid LAD and to RCA were placed.     Past Surgical History  Procedure Laterality Date  . Knee arthroscopy  2001    Bilateral  . Amputation  10/24/2012    Procedure: AMPUTATION RAY;  Surgeon: Sherri Rad, MD;  Location: [facility];  Service: Orthopedics;  Laterality: Left;  left 4th toe amputation through MTP joint, 5th Ray amputation   . Knee surgery  2001  . Hida scan  06/2014    Normal  . Transthoracic echocardiogram  07/19/14    Mild LV dilation and hypertrophy, EF 35-40%, no wall motion abnormalities, grade II diast dysfxn, bicuspid aortic valve with mild AS and mild AI.  [name] Cardiovascular stress test  07/2014    Myoview w/out ischemia but large scar (inferior/apex) w/ global hypokinesis--medical mgmt  . Eye surgery  08/2014    Retinal hemorrhage evacuation  . Coronary angioplasty with stent placement  09/2014    DES to mid LAD and RCA; EF 40% by cardiac MRI  . Cardiac mri  09/2014    Moderately dilated LV.  EF 40%, multiple wall motion abnormalities, normal RV fxn.  Some areas of scarring.      Prior to Admission medications   Medication Sig Start Date End Date Taking? Authorizing Provider  acetaminophen (TYLENOL) 500 MG tablet Take 500 mg by mouth every 6 (six) hours as needed for moderate pain.    Historical Provider, MD  aspirin 81 MG chewable tablet Chew 1 tablet (81 mg total) by mouth daily. 10/18/14   Lonia Blood, MD  atorvastatin (LIPITOR) 20 MG tablet Take 1 tablet (20 mg total) by mouth daily at 6 PM. 10/18/14   Lonia Blood, MD  carvedilol (COREG) 6.25 MG tablet Take 1 tablet (6.25 mg total) by mouth 2 (two) times daily with a meal. 10/18/14   Lonia Blood, MD  clopidogrel (PLAVIX) 75 MG tablet Take 1 tablet (75 mg total) by mouth daily. 10/18/14   Lonia Blood, MD  furosemide (LASIX) 80 MG tablet Take 0.5  tablets (40 mg total) by mouth 2 (two) times daily. 10/24/14   Laurey Morale, MD  glucagon 1 MG injection Inject 1 mg into the vein once as needed. For hypoglycemia 02/25/14   [name], MD  insulin aspart (NOVOLOG) 100 UNIT/ML injection Inject 0-10 Units into the skin 3 (three) times daily before meals. Sliding scale    Historical Provider, MD  insulin glargine (LANTUS) 100 UNIT/ML injection Inject 0.22 mLs (22 Units total) into the skin daily. Patient advised to take insulin in the morning after working nights;  If working first or second shifts, takes insulin at bedtime 10/18/14   Lonia Blood, MD  lidocaine (LIDODERM) 5 % Place 1 patch onto the skin daily.  Remove & Discard patch within 12 hours or as directed by MD 10/18/14   Lonia BloodJeffrey T McClung, MD  lisinopril (PRINIVIL,ZESTRIL) 5 MG tablet Take 0.5 tablets (2.5 mg total) by mouth 2 (two) times daily. 10/24/14   Laurey Moralealton S McLean, MD  nitroGLYCERIN (NITROSTAT) 0.4 MG SL tablet Place 1 tablet (0.4 mg total) under the tongue every 5 (five) minutes as needed for chest pain. 10/18/14   Lonia BloodJeffrey T McClung, MD  pantoprazole (PROTONIX) 40 MG tablet Take 1 tablet (40 mg total) by mouth daily. 07/04/14   Jeoffrey MassedPhilip H McGowen, MD  ranitidine (ZANTAC) 150 MG tablet Take 150 mg by mouth daily as needed for heartburn.    Historical Provider, MD  spironolactone (ALDACTONE) 25 MG tablet Take 1 tablet (25 mg total) by mouth daily. 10/18/14   Lonia BloodJeffrey T McClung, MD  traMADol (ULTRAM) 50 MG tablet Take 1 tablet (50 mg total) by mouth every 6 (six) hours as needed for moderate pain. 10/18/14   Lonia BloodJeffrey T McClung, MD     Allergies  Allergen Reactions  . Iodine Other (See Comments)    Family history of anaphylaxis, unsure if patient has reaction  . Other Shortness Of Breath, Itching and Other (See Comments)    PERFUMES - SNEEZING  . Hydralazine Hcl Hives  . Naproxen Nausea And Vomiting     Social History:  reports that he has never smoked. His smokeless  tobacco use includes Chew. He reports that he does not drink alcohol or use illicit drugs.     Family History  Problem Relation Age of Onset  . Heart disease Father   . Diabetes type II Sister        Physical Exam:  GEN:  Pleasant Obese 53 y.o. Caucasian male examined and in no acute distress; cooperative with exam Filed Vitals:   10/24/14 1600 10/24/14 1700 10/24/14 1730 10/24/14 1847  BP: 97/31 110/46 105/44 118/61  Pulse: 68 74 78 75  Temp:    98.3 F (36.8 C)  TempSrc:    Oral  Resp: 14 15 15 16   Height:      Weight:      SpO2: 95% 93% 97% 100%   Blood pressure 118/61, pulse 75, temperature 98.3 F (36.8 C), temperature source Oral, resp. rate 16, height 5\' 10"  (1.778 m), weight 96.616 kg (213 lb), SpO2 100 %. PSYCH: He is alert and oriented x4; does not appear anxious does not appear depressed; affect is normal HEENT: Normocephalic and Atraumatic, Mucous membranes pink; PERRLA; EOM intact; Fundi:  Benign;  No scleral icterus, Nares: Patent, Oropharynx: Clear, Fair Dentition,    Neck:  FROM, No Cervical Lymphadenopathy nor Thyromegaly or Carotid Bruit; No JVD; Breasts:: Not examined CHEST WALL: No tenderness CHEST: Normal respiration, clear to auscultation bilaterally HEART: Regular rate and rhythm; no murmurs rubs or gallops BACK: No kyphosis or scoliosis; No CVA tenderness ABDOMEN: Positive Bowel Sounds, Obese, Soft Non-Tender; No Masses, No Organomegaly. Rectal Exam: Not done EXTREMITIES: No Cyanosis, Clubbing, or Edema; No Ulcerations. Genitalia: not examined PULSES: 2+ and symmetric SKIN: Normal hydration no rash or ulceration CNS:  Alert and Oriented x4, No focal Deficits  Vascular: pulses palpable throughout    Labs on Admission:  Basic Metabolic Panel:  Recent Labs Lab 10/18/14 0529 10/20/14 1459 10/24/14 1100 10/24/14 1300  NA 141 136 137 135*  K 4.6 4.7 7.2* 5.8*  CL 102 97 96 94*  CO2 29 32 26 25  GLUCOSE 118* 133* 116* 123*  BUN 28* 43* 74*  74*  CREATININE 1.37* 2.1* 2.25* 2.30*  CALCIUM 8.5 8.6 9.6 9.2   Liver Function Tests:  Recent Labs Lab 10/24/14 1100  AST 28  ALT 16  ALKPHOS 242*  BILITOT 1.0  PROT 6.9  ALBUMIN 3.5   No results for input(s): LIPASE, AMYLASE in the last 168 hours. No results for input(s): AMMONIA in the last 168 hours. CBC:  Recent Labs Lab 10/18/14 0529 10/20/14 1459 10/24/14 1100 10/24/14 1300  WBC 3.2* 4.0 5.9 4.7  NEUTROABS  --  2.8  --  3.5  HGB 8.8* 10.2* 11.1* 10.2*  HCT 26.9* 31.4* 34.3* 31.2*  MCV 92.4 91.6 93.0 93.4  PLT 67* 66.0* 69* 62*   Cardiac Enzymes:  Recent Labs Lab 10/24/14 1300  TROPONINI <0.30    BNP (last 3 results)  Recent Labs  10/14/14 0545 10/16/14 0323 10/24/14 1041  PROBNP 14218.0* 16361.0* 5640.0*   CBG:  Recent Labs Lab 10/17/14 2052 10/18/14 0702 10/24/14 1516 10/24/14 1851  GLUCAP 182* 110* 121* 178*    Radiological Exams on Admission: Dg Chest 2 View  10/24/2014   CLINICAL DATA:  MRI 2 weeks ago.  Dizziness.  EXAM: CHEST  2 VIEW  COMPARISON:  10/09/2014.  FINDINGS: Interim removal of right IJ line. Mediastinum and hilar structures are normal. The lungs are clear. Mild cardiomegaly. No pulmonary venous congestion. Previously identified changes congestive heart failure have cleared. Tiny residual left pleural effusion. No pneumothorax. Degenerative changes thoracic spine.  IMPRESSION: Interim scratch clearing of congestive heart failure and pulmonary edema with minimal residual left pleural effusion.   Electronically Signed   By: Maisie Fus  Register   On: 10/24/2014 11:50     EKG: Independently reviewed. Normal sinus Rhythm rate 62   Assessment/Plan:   53 y.o. male with   Principal Problem:   1.    Hyperkalemia due to Meds   Telemetry Monitoring   ReCheck K+ level =  5.2 at 2030.       Repeat administration of Kayexalate if K+ > 5.2   Hold Spironolactone and Lisinopril Rx      Active Problems:   2.   AKI (acute kidney  injury)   Hold Lasix Rx   Gentle Rehydratiion      3.   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   Diabetes mellitus type 2, uncontrolled, with complications   Continue Lantus Insulin and SSI coverage PRN     5.   Liver cirrhosis secondary to NASH- Chronic     6.   Cardiomyopathy, ischemic-    Monitor for Signs and Sxs of Fluid Overload     7.   CAD (coronary artery disease)   Continue Plavix and ASA and Coreg Rx     8. DVT Prophylaxis   Lovenox   Code Status:   FULL CODE Family Communication:  Sister at Bedside   Disposition Plan:       Inpatient  Time spent: 42 Minutes  Ron Parker [facility] Pager [phone]   If 7AM -7PM Please Contact the Day Rounding Team MD for [facility]  If 7PM-7AM, Please Contact Night-Floor Coverage  [email or web address] Password [facility] 10/24/2014, 8:27 PM

## 2014-10-24 NOTE — ED Notes (Signed)
He was sent here after a check up with his MD and his potassium was high. Hx cardiac stent last week.

## 2014-10-24 NOTE — ED Notes (Signed)
EMS is coming to transport patient

## 2014-10-24 NOTE — ED Provider Notes (Signed)
CSN: [id number]     Arrival date & time 10/24/14  1233 History   First MD Initiated Contact with Patient 10/24/14 1246     Chief Complaint  Patient presents with  . Hyperkalemia      (Consider location/radiation/quality/duration/timing/severity/associated sxs/prior Treatment) HPI 53 y.o. Male s/p out of hospital cardiac arrest  probable v fib arrest on October 15 s/p stent and hypothermia protocolnow with renal failure and hyperkalemia.  he had acute renal injury during event. He was discharged on metolazone and Lasix. He was seen in follow-up his cardiologist's office today and noted to be hyperkalemic. He was called and asked to come to the hospital for further treatment. He denies any generalized weakness or chest pain. He has been somewhat lightheaded with standing up. His weight has gone from 237 2 113 pounds from discharge last Saturday until today.he appears to have some orthostatic symptoms. The report from the cardiologist's office is that his potassium was 7.2 and it was not hemolyzed. Past Medical History  Diagnosis Date  . Heart murmur     ECHO 07/2011 showed mild AS and LVH.  TEE 09/2011 showed small PFO, bicuspid aortic valve but no stenosis or regurg.  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  Gen surg and GI recommended flagyl and cipro x 2 wks..  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  . Cholelithiasis     u/s--no cholecystitis.  06/2014 u/s showed no stones, only GB sludge  . Peripheral neuropathy     Diabetic:  Autonomic (pelvic) and LE polyneuropathy--[facility] testing showed that it is likely from DM  . Diabetic foot ulcers     Poor healing; normal ABIs and TBIs  . Arthritis     back L3 - L5  . Hyperactive gag reflex   . History of pyelonephritis   . Type II or unspecified type diabetes mellitus with  neurological manifestations, not stated as uncontrolled     IDDM: neuro, renal, and ophth complications  . Wears dentures     upper  . Osteomyelitis of toe of left foot     left 4th and 5th toes--now s/p amputation of these areas  . Diabetic retinopathy associated with type 2 diabetes mellitus     Laser tx in both eyes (Dr. Allyne Gee)  . Diabetic nephropathy     Proteinuria and CrCl 55-65  . Chronic renal insufficiency, stage III (moderate)     CrCl 50s-80s 2014 to pre-VFIB arrest 10/02/14, at which time he sustained AKI.  . Lumbar spondylosis     MRI 08/25/11 with multilevel facet dz, small disc protrusion at L5-S1 to the right without impingement  . Ischemic cardiomyopathy 07/22/14    Myoview w/out ischemia but large scar (inferior/apex) w/ global hypokinesis--medical mgmt  . History of pancreatitis 2014 and 2015    Suspected biliary: passing small gallstones: surgery declined to remove GB 07/2014.  . Aspiration pneumonia   . Thrombocytopenia   . Portal hypertension   . Liver cirrhosis secondary to NASH     ?+ alcohol?.  With hx of splenomegaly and ascities (dx approx 2012)  . History of gram negative sepsis 2015    e coli  . Ventricular fibrillation 10/02/14    Resuscitated, hospitalized, 3 V CAD detected, DES stent to mid LAD and to RCA were placed.   Past Surgical History  Procedure  Laterality Date  . Knee arthroscopy  2001    Bilateral  . Amputation  10/24/2012    Procedure: AMPUTATION Lorenz Donley;  Surgeon: Sherri Rad, MD;  Location: [facility];  Service: Orthopedics;  Laterality: Left;  left 4th toe amputation through MTP joint, 5th Kleo Dungee amputation   . Knee surgery  2001  . Hida scan  06/2014    Normal  . Transthoracic echocardiogram  07/19/14    Mild LV dilation and hypertrophy, EF 35-40%, no wall motion abnormalities, grade II diast dysfxn, bicuspid aortic valve with mild AS and mild AI.  [name] Cardiovascular stress test  07/2014    Myoview w/out ischemia but large scar  (inferior/apex) w/ global hypokinesis--medical mgmt  . Eye surgery  08/2014    Retinal hemorrhage evacuation  . Coronary angioplasty with stent placement  09/2014    DES to mid LAD and RCA; EF 40% by cardiac MRI  . Cardiac mri  09/2014    Moderately dilated LV.  EF 40%, multiple wall motion abnormalities, normal RV fxn.  Some areas of scarring.   Family History  Problem Relation Age of Onset  . Heart disease Father   . Diabetes type II Sister    History  Substance Use Topics  . Smoking status: Never Smoker   . Smokeless tobacco: Current User    Types: Chew  . Alcohol Use: No     Comment: Quit drinking alcohol 08/20/2011    Review of Systems  All other systems reviewed and are negative.     Allergies  Iodine; Other; Hydralazine hcl; and Naproxen  Home Medications   Prior to Admission medications   Medication Sig Start Date End Date Taking? Authorizing Provider  acetaminophen (TYLENOL) 500 MG tablet Take 500 mg by mouth every 6 (six) hours as needed for moderate pain.    Historical Provider, MD  aspirin 81 MG chewable tablet Chew 1 tablet (81 mg total) by mouth daily. 10/18/14   Lonia Blood, MD  atorvastatin (LIPITOR) 20 MG tablet Take 1 tablet (20 mg total) by mouth daily at 6 PM. 10/18/14   Lonia Blood, MD  carvedilol (COREG) 6.25 MG tablet Take 1 tablet (6.25 mg total) by mouth 2 (two) times daily with a meal. 10/18/14   Lonia Blood, MD  clopidogrel (PLAVIX) 75 MG tablet Take 1 tablet (75 mg total) by mouth daily. 10/18/14   Lonia Blood, MD  furosemide (LASIX) 80 MG tablet Take 0.5 tablets (40 mg total) by mouth 2 (two) times daily. 10/24/14   Laurey Morale, MD  glucagon 1 MG injection Inject 1 mg into the vein once as needed. For hypoglycemia 02/25/14   [name], MD  insulin aspart (NOVOLOG) 100 UNIT/ML injection Inject 0-10 Units into the skin 3 (three) times daily before meals. Sliding scale    Historical Provider, MD  insulin glargine  (LANTUS) 100 UNIT/ML injection Inject 0.22 mLs (22 Units total) into the skin daily. Patient advised to take insulin in the morning after working nights;  If working first or second shifts, takes insulin at bedtime 10/18/14   Lonia Blood, MD  lidocaine (LIDODERM) 5 % Place 1 patch onto the skin daily. Remove & Discard patch within 12 hours or as directed by MD 10/18/14   Lonia Blood, MD  lisinopril (PRINIVIL,ZESTRIL) 5 MG tablet Take 0.5 tablets (2.5 mg total) by mouth 2 (two) times daily. 10/24/14   Laurey Morale, MD  nitroGLYCERIN (NITROSTAT) 0.4 MG  SL tablet Place 1 tablet (0.4 mg total) under the tongue every 5 (five) minutes as needed for chest pain. 10/18/14   Lonia Blood, MD  pantoprazole (PROTONIX) 40 MG tablet Take 1 tablet (40 mg total) by mouth daily. 07/04/14   [name], MD  ranitidine (ZANTAC) 150 MG tablet Take 150 mg by mouth daily as needed for heartburn.    Historical Provider, MD  spironolactone (ALDACTONE) 25 MG tablet Take 1 tablet (25 mg total) by mouth daily. 10/18/14   Lonia Blood, MD  traMADol (ULTRAM) 50 MG tablet Take 1 tablet (50 mg total) by mouth every 6 (six) hours as needed for moderate pain. 10/18/14   Lonia Blood, MD   BP 90/40 mmHg  Pulse 63  Temp(Src) 98.6 F (37 C) (Oral)  Resp 18  Ht 5\' 10"  (1.778 m)  Wt 213 lb (96.616 kg)  BMI 30.56 kg/m2  SpO2 100% Physical Exam  Constitutional: He is oriented to person, place, and time. He appears well-developed and well-nourished.  pale  HENT:  Head: Normocephalic and atraumatic.  Nose: Nose normal.  Mouth/Throat: Oropharynx is clear and moist.  Eyes: Conjunctivae and EOM are normal. Pupils are equal, round, and reactive to light.  Neck: Normal range of motion. Neck supple.  Cardiovascular: Normal rate, regular rhythm, normal heart sounds and intact distal pulses.   Pulmonary/Chest: Effort normal and breath sounds normal.  Abdominal: Soft. Bowel sounds are normal.   Musculoskeletal: Normal range of motion.  Neurological: He is alert and oriented to person, place, and time. He has normal reflexes.  Skin: Skin is warm. There is pallor.  Psychiatric: He has a normal mood and affect. His behavior is normal. Judgment and thought content normal.  Nursing note and vitals reviewed.   ED Course  Procedures (including critical care time) Labs Review Labs Reviewed  CBC WITH DIFFERENTIAL - Abnormal; Notable for the following:    RBC 3.34 (*)    Hemoglobin 10.2 (*)    HCT 31.2 (*)    Platelets 62 (*)    Lymphocytes Relative 11 (*)    Lymphs Abs 0.5 (*)    Basophils Relative 2 (*)    All other components within normal limits  BASIC METABOLIC PANEL - Abnormal; Notable for the following:    Sodium 135 (*)    Potassium 5.8 (*)    Chloride 94 (*)    Glucose, Bld 123 (*)    BUN 74 (*)    Creatinine, Ser 2.30 (*)    GFR calc non Af Amer 31 (*)    GFR calc Af Amer 36 (*)    Anion gap 16 (*)    All other components within normal limits  APTT  GLUCOSE, RANDOM  TROPONIN I    Imaging Review Dg Chest 2 View  10/24/2014   CLINICAL DATA:  MRI 2 weeks ago.  Dizziness.  EXAM: CHEST  2 VIEW  COMPARISON:  10/09/2014.  FINDINGS: Interim removal of right IJ line. Mediastinum and hilar structures are normal. The lungs are clear. Mild cardiomegaly. No pulmonary venous congestion. Previously identified changes congestive heart failure have cleared. Tiny residual left pleural effusion. No pneumothorax. Degenerative changes thoracic spine.  IMPRESSION: Interim scratch clearing of congestive heart failure and pulmonary edema with minimal residual left pleural effusion.   Electronically Signed   By: Maisie Fus  Register   On: 10/24/2014 11:50   Date: 10/24/2014  Rate: 62  Rhythm: normal sinus rhythm  QRS Axis: normal  Intervals: normal  ST/T Wave abnormalities: nonspecific ST/T changes  Conduction Disutrbances:none  Narrative Interpretation:   Old EKG Reviewed:  unchanged Anterior q waves.   MDM   Final diagnoses:  Hyperkalemia  Volume depletion  Hypotension due to drugs   53 year old male status post out of hospital cardiac arrest now 1 week post discharge with known acute kidney injury who presents today with hyperkalemia. Potassium from office was 7.2 but is 5.8 here. EKG is unchanged from prior without any changes of precaval anemia such as hyperacute T waves. He is orthostatic with blood pressure going from 104/60 on laying down to 80/40 on standing with some lightheadedness associated. I suspect he has been over diuresed. He will receive some IV fluids and stabilization of his hyperkalemia with 1 and calcium here. [facility]. Prior to coming in to the ED he was told to stop his metolazone and Lasix had an chest x-Huda Petrey done today prior to evaluation the ED.  Discussed with Dr.Rai and plan obs to telemetry.   Hilario Quarryanielle S Reniyah Gootee, MD 10/24/14 [phone]

## 2014-10-25 LAB — BASIC METABOLIC PANEL
Anion gap: 12 (ref 5–15)
BUN: 67 mg/dL — ABNORMAL HIGH (ref 6–23)
CHLORIDE: 95 meq/L — AB (ref 96–112)
CO2: 29 mEq/L (ref 19–32)
CREATININE: 2.18 mg/dL — AB (ref 0.50–1.35)
Calcium: 8.6 mg/dL (ref 8.4–10.5)
GFR, EST AFRICAN AMERICAN: 38 mL/min — AB (ref >90)
GFR, EST NON AFRICAN AMERICAN: 33 mL/min — AB (ref >90)
Glucose, Bld: 145 mg/dL — ABNORMAL HIGH (ref 70–99)
Potassium: 4.4 mEq/L (ref 3.7–5.3)
Sodium: 136 mEq/L — ABNORMAL LOW (ref 137–147)

## 2014-10-25 LAB — CBC
HCT: 30.2 % — ABNORMAL LOW (ref 39.0–52.0)
Hemoglobin: 9.7 g/dL — ABNORMAL LOW (ref 13.0–17.0)
MCH: 29.2 pg (ref 26.0–34.0)
MCHC: 32.1 g/dL (ref 30.0–36.0)
MCV: 91 fL (ref 78.0–100.0)
PLATELETS: 51 10*3/uL — AB (ref 150–400)
RBC: 3.32 MIL/uL — ABNORMAL LOW (ref 4.22–5.81)
RDW: 14.8 % (ref 11.5–15.5)
WBC: 3.2 10*3/uL — ABNORMAL LOW (ref 4.0–10.5)

## 2014-10-25 LAB — GLUCOSE, CAPILLARY
GLUCOSE-CAPILLARY: 150 mg/dL — AB (ref 70–99)
Glucose-Capillary: 148 mg/dL — ABNORMAL HIGH (ref 70–99)
Glucose-Capillary: 131 mg/dL — ABNORMAL HIGH (ref 70–99)
Glucose-Capillary: 318 mg/dL — ABNORMAL HIGH (ref 70–99)

## 2014-10-25 MED ORDER — FUROSEMIDE 80 MG PO TABS: 40.0000 mg | ORAL_TABLET | Freq: Every day | ORAL | Status: DC | PRN

## 2014-10-25 NOTE — Discharge Summary (Signed)
Physician Discharge Summary  Ralph BuckerRobert A Grotz ZOX:[id number] DOB: 1961-01-08 DOA: 10/24/2014  PCP: Jeoffrey MassedMCGOWEN,PHILIP H, MD  Admit date: 10/24/2014 Discharge date: 10/25/2014  Time spent: 45 minutes  Recommendations for Outpatient Follow-up:  1. Follow up in Heart failure clinic in 2 days on 11/9 at 3:40 pm   Recommendations for primary care physician for things to follow:  Repeat CBC, CMP, patient's weight  Discharge Diagnoses:  Principal Problem:   Hyperkalemia Active Problems:   Thrombocytopenia   Diabetes mellitus type 2, uncontrolled, with complications   Liver cirrhosis secondary to NASH   AKI (acute kidney injury)   Cardiomyopathy, ischemic   CAD (coronary artery disease)   Discharge Condition: stable  Diet recommendation: low salt, fluid restricted, heart healthy  Filed Weights   10/24/14 1240  Weight: 96.616 kg (213 lb)   History of present illness:  [name] is a 53 y.o. male with a history of NASH, CAD with recent PCI and Stents to LAD and RCA who was seen for follow-up in his PCP's office this and and had labs done which returned with an elevated potassium level. He was contacted and instructed to go to the nearest ED for evaluation. [facility] and A repeat potassium level was done and found to be 7.2, he was administered medications including Kayexalate and referred for admission.  Hospital Course:  Patient was admitted to the hospital with hyperkalemia and acute renal failure likely due to his home furosemide, metolazone, spironolactone and lisinopril. Patient was discharged about a week ago and was placed on diuretics, and he endorses significant urination and a 25 pound weight loss in the last 6 days. He was seen yesterday and had his BMP checked which showed hyperkalemia potassium of 7.2 and was directed to the emergency room. Patient was admitted, he received immediate treatment in the ED, as well as Kayexalate and IV fluids. Patient's  potassium improved normal range of 4.4 on 11/7, and his renal function as well as started to improve with hydration. Patient was completely asymptomatic throughout all this. Patient was discharged home on 11/7 in stable condition, his potassium and renal function improving, he is quite dry at this point and will discontinue his spironolactone and lisinopril on discharge and will make Lasix available when necessary but not daily, and he was advised to continue daily weights, salt and fluid restriction. He has close follow-up in 2 days in clinic, where I recommend that he get some repeat CMP and a CBC as well as his weight checked. He is to continue home health RN and home health PT as per heart failure. On the day of discharge patient had no lightheadedness or dizziness, he was able to ambulate in the unit without any chest pain or breathing difficulties.  Procedures:  none   Consultations:  none  Discharge Exam: Filed Vitals:   10/24/14 2108 10/25/14 0354 10/25/14 0803 10/25/14 1300  BP: 150/34 122/62 122/61 108/51  Pulse:  70 75 65  Temp:  98.1 F (36.7 C)  98.8 F (37.1 C)  TempSrc:  Oral  Oral  Resp:  15  16  Height:      Weight:      SpO2:  99%  98%    General: distress Cardiovascular: regular rate and rhythm, 2+ pulses, no peripheral edema Respiratory: clear to auscultation bilaterally  Discharge Instructions  Discharge Instructions    Face-to-face encounter (required for Medicare/Medicaid patients)    Complete by:  As directed   I  Priyansh Pry certify that this patient is under my care and that I, or a nurse practitioner or physician's assistant working with me, had a face-to-face encounter that meets the physician face-to-face encounter requirements with this patient on 10/25/2014. The encounter with the patient was in whole, or in part for the following medical condition(s) which is the primary reason for home health care (List medical condition): CHF  The encounter with  the patient was in whole, or in part, for the following medical condition, which is the primary reason for home health care:  CHF  I certify that, based on my findings, the following services are medically necessary home health services:   NursingPhysical therapy    Reason for Medically Necessary Home Health Services:  Therapy- Investment banker, operational, Patent examiner  My clinical findings support the need for the above services:  OTHER SEE COMMENTS  Further, I certify that my clinical findings support that this patient is homebound due to:  Unable to leave home safely without assistance     Heart failure home health orders    Complete by:  As directed   Heart Failure Follow-up Care:  Verify follow-up appointments per Patient Discharge Instructions. Confirm transportation arranged. Reconcile home medications with discharge medication list. Remove discontinued medications from use. Assist patient/caregiver to manage medications using pill box. Reinforce low sodium food selection Assessments: Vital signs and oxygen saturation at each visit. Assess home environment for safety concerns, caregiver support and availability of low-sodium foods. Consult Child psychotherapist, PT/OT, Dietitian, and CNA based on assessments. Perform comprehensive cardiopulmonary assessment. Notify MD for any change in condition or weight gain of 3 pounds in one day or 5 pounds in one week with symptoms. Daily Weights and Symptom Monitoring: Ensure patient has access to scales. Teach patient/caregiver to weigh daily before breakfast and after voiding using same scale and record.    Teach patient/caregiver to track weight and symptoms and when to notify Provider. Activity: Develop individualized activity plan with patient/caregiver.  Heart Failure Follow-up Care:  [facility] at [phone]  Obtain the following labs:  Basic Metabolic Panel  Lab frequency:  Weekly  Fax lab results to:  [facility]inic at [phone]  Diet:  Low Sodium Heart Healthy  Fluid restrictions:  1200 mL Fluid  Consult:  Case manager            Medication List    STOP taking these medications        lisinopril 5 MG tablet  Commonly known as:  PRINIVIL,ZESTRIL     spironolactone 25 MG tablet  Commonly known as:  ALDACTONE      TAKE these medications        acetaminophen 500 MG tablet  Commonly known as:  TYLENOL  Take 500 mg by mouth every 6 (six) hours as needed for moderate pain.     aspirin 81 MG chewable tablet  Chew 1 tablet (81 mg total) by mouth daily.     atorvastatin 20 MG tablet  Commonly known as:  LIPITOR  Take 1 tablet (20 mg total) by mouth daily at 6 PM.     carvedilol 6.25 MG tablet  Commonly known as:  COREG  Take 1 tablet (6.25 mg total) by mouth 2 (two) times daily with a meal.     clopidogrel 75 MG tablet  Commonly known as:  PLAVIX  Take 1 tablet (75 mg total) by mouth daily.     furosemide 80 MG tablet  Commonly known as:  LASIX  Take 0.5 tablets (40 mg total) by mouth daily as needed for fluid or edema.     glucagon 1 MG injection  Inject 1 mg into the vein once as needed. For hypoglycemia     insulin aspart 100 UNIT/ML injection  Commonly known as:  novoLOG  Inject 0-10 Units into the skin 3 (three) times daily before meals. Sliding scale     insulin glargine 100 UNIT/ML injection  Commonly known as:  LANTUS  - Inject 0.22 mLs (22 Units total) into the skin daily. Patient advised to take insulin in the morning after working nights;   - If working first or second shifts, takes insulin at bedtime     lidocaine 5 %  Commonly known as:  LIDODERM  Place 1 patch onto the skin daily. Remove & Discard patch within 12 hours or as directed by MD     nitroGLYCERIN 0.4 MG SL tablet  Commonly known as:  NITROSTAT  Place 1 tablet (0.4 mg total) under the tongue every 5 (five) minutes as needed for chest pain.     pantoprazole 40 MG tablet  Commonly known as:   PROTONIX  Take 1 tablet (40 mg total) by mouth daily.     ranitidine 150 MG tablet  Commonly known as:  ZANTAC  Take 150 mg by mouth daily as needed for heartburn.     traMADol 50 MG tablet  Commonly known as:  ULTRAM  Take 1 tablet (50 mg total) by mouth every 6 (six) hours as needed for moderate pain.           Follow-up Information    Follow up with [facility].   Why:  nurse and physical therapy   Contact information:   [address] [place] [address] [phone]       The results of significant diagnostics from this hospitalization (including imaging, microbiology, ancillary and laboratory) are listed below for reference.    Significant Diagnostic Studies: Dg Chest 2 View  10/24/2014   CLINICAL DATA:  MRI 2 weeks ago.  Dizziness.  EXAM: CHEST  2 VIEW  COMPARISON:  10/09/2014.  FINDINGS: Interim removal of right IJ line. Mediastinum and hilar structures are normal. The lungs are clear. Mild cardiomegaly. No pulmonary venous congestion. Previously identified changes congestive heart failure have cleared. Tiny residual left pleural effusion. No pneumothorax. Degenerative changes thoracic spine.  IMPRESSION: Interim scratch clearing of congestive heart failure and pulmonary edema with minimal residual left pleural effusion.   Electronically Signed   By: Maisie Fus  Register   On: 10/24/2014 11:50   Ct Head Wo Contrast  10/02/2014   CLINICAL DATA:  53 year old male with history of trauma after after a fall during a syncopal episode during which the patient fell backward and struck his head. Status post CPR.  EXAM: CT HEAD WITHOUT CONTRAST  TECHNIQUE: Contiguous axial images were obtained from the base of the skull through the vertex without intravenous contrast.  COMPARISON:  No priors.  FINDINGS: No acute displaced skull fractures are identified. No acute intracranial abnormality. Specifically, no evidence of acute post-traumatic intracranial hemorrhage,  no definite regions of acute/subacute cerebral ischemia, no focal mass, mass effect, hydrocephalus or abnormal intra or extra-axial fluid collections. The visualized paranasal sinuses and mastoids are well pneumatized, with exception of a small air-fluid level in the posterior aspect of the left maxillary sinus and right sphenoid sinus.  IMPRESSION: 1. Small amount of soft tissue swelling in  the occipital scalp, without underlying displaced skull fracture or acute intracranial abnormalities. 2. Small air-fluid level in the left maxillary sinus and right sphenoid sinus. This is nonspecific, but correlation for signs of sinusitis is recommended.   Electronically Signed   By: Trudie Reed M.D.   On: 10/02/2014 16:36   Dg Chest Port 1 View  10/09/2014   CLINICAL DATA:  History of cardiac arrest last week ; persistent chest soreness and shortness of breath ; history of CHF and diabetes  EXAM: PORTABLE CHEST - 1 VIEW  COMPARISON:  Portable chest x-ray of October 05, 2014  FINDINGS: The lung volumes remain low. The left hemidiaphragm remains obscured. There is partial obscuration of the right hemidiaphragm today. The cardiopericardial silhouette remains enlarged. The pulmonary vascularity is mildly engorged but has improved somewhat since the previous study. The trachea and esophagus have been extubated. A right internal jugular venous catheter tip projects over the midportion of the SVC.  IMPRESSION: Moderate CHF with bilateral pleural effusions and mild pulmonary vascular congestion. The findings are accentuated by hypoinflation. When the patient can tolerate the procedure, a PA and lateral chest x-ray with deep inspiration would be useful.   Electronically Signed   By: David  [place]   On: 10/09/2014 15:06   Dg Chest Port 1 View  10/05/2014   CLINICAL DATA:  Acute respiratory disease.  History of diabetes  EXAM: PORTABLE CHEST - 1 VIEW  COMPARISON:  10/04/2014  FINDINGS: There is a a right IJ catheter with  tip in the projection of the SVC. The endotracheal tube tip is just above the carina. Nasogastric tube tip is in the stomach. The heart size appears enlarged. No change and suspected left pleural effusion with overlying atelectasis/consolidation. Right lung is clear.  IMPRESSION: 1. Stable support apparatus. 2. No change in aeration to the left base.   Electronically Signed   By: Signa Kell M.D.   On: 10/05/2014 08:42   Dg Chest Port 1 View  10/04/2014   CLINICAL DATA:  Acute respiratory failure  EXAM: PORTABLE CHEST - 1 VIEW  COMPARISON:  Radiograph 10/03/2014  FINDINGS: Endotracheal tube, NG tube, right central venous line are unchanged. Stable cardiac silhouette. The is dense left basilar atelectasis unchanged. Mild right basilar airspace disease. No pneumothorax  IMPRESSION: 1. Stable support apparatus. 2. No change in dense left basilar atelectasis/airspace disease. 3. Mild airspace disease on the right. 4. Overall no interval change   Electronically Signed   By: Genevive Bi M.D.   On: 10/04/2014 07:22   Dg Chest Port 1 View  10/03/2014   CLINICAL DATA:  Respiratory failure/hypoxia  EXAM: PORTABLE CHEST - 1 VIEW  COMPARISON:  October 02, 2014  FINDINGS: Endotracheal tube tip is 2.7 cm above the carina. Nasogastric tube tip and side port are below the diaphragm. Central catheter tip is in the superior vena cava. The questionable trace pneumothorax on the left seen 1 day prior is not appreciable on this examination. No pneumothorax is apparent currently.  There is persistent consolidation in the left lower lobe. Hazy opacity throughout much of the right lung is slightly less apparent currently. No new opacity is seen. Heart is enlarged with pulmonary vascularity within normal limits. No adenopathy.  IMPRESSION: Tube and catheter positions as described without appreciable pneumothorax. Infiltrate bilaterally, more on the right than on the left, is again noted. There is slightly less consolidation  on the right and no appreciable change in the left lower lobe. No new opacity.  Electronically Signed   By: Bretta Bang M.D.   On: 10/03/2014 07:56   Dg Chest Port 1 View  10/02/2014   CLINICAL DATA:  Unsuccessful attempted left-sided central line placement. Subsequent encounter.  EXAM: PORTABLE CHEST - 1 VIEW  COMPARISON:  10/02/2014 (multiple examinations)  FINDINGS: Grossly unchanged enlarged cardiac silhouette and mediastinal contours. Stable positioning of support apparatus. Query trace left apical pneumothorax. Pulmonary vasculature remains indistinct with cephalization of flow with relative area of confluence about the right mid lung. Grossly unchanged left basilar/retrocardiac consolidative opacities. Trace of sided effusion is not excluded. Unchanged bones.  IMPRESSION: 1.  Stable positioning of support apparatus. 2. Query trace left apical pneumothorax. Continued attention on follow-up is recommended. 3. Similar findings of asymmetric pulmonary edema worse within the right mid and left lower lung. Critical Value/emergent results were called by telephone at the time of interpretation on 10/02/2014 at 10:23 pm to [facility] [place], who verbally acknowledged these results.   Electronically Signed   By: Simonne Come M.D.   On: 10/02/2014 22:24   Dg Chest Portable 1 View  10/02/2014   CLINICAL DATA:  Readjustment of endotracheal tube.  EXAM: PORTABLE CHEST - 1 VIEW  COMPARISON:  Same day.  FINDINGS: Stable cardiomediastinal silhouette. Endotracheal tube is now seen with distal tip 3 cm above the carina in grossly good position. Right internal jugular catheter remains with distal tip overlying expected position of the SVC. Stable bilateral lung opacities are noted. No pneumothorax or pleural effusion is noted.  IMPRESSION: Stable bilateral lung opacities. Endotracheal tube in grossly good position.   Electronically Signed   By: Roque Lias M.D.   On: 10/02/2014 17:04   Dg Chest Portable 1  View  10/02/2014   CLINICAL DATA:  Status post intubation and central line placement.  EXAM: PORTABLE CHEST - 1 VIEW  COMPARISON:  July 18, 2014.  FINDINGS: Right internal jugular catheter line is noted with distal tip in expected position of the SVC. Endotracheal tube is directed in the right mainstem bronchus. It is recommended at be withdrawn approximately 4 cm. Diffuse airspace opacities are noted in the right lung in left upper lobe, as well as large left retrocardiac opacity concerning for bilateral pneumonia. No pneumothorax or significant pleural effusion is noted. Bony thorax is intact.  IMPRESSION: Significantly increased bilateral lung opacities are noted concerning for pneumonia or possibly edema. Endotracheal tube is directed in the right mainstem bronchus; it is recommended to be withdrawn at least 4 cm. Critical Value/emergent results were called by telephone at the time of interpretation on 10/02/2014 at 4:15 pm to Dr. Zadie Rhine , who verbally acknowledged these results.   Electronically Signed   By: Roque Lias M.D.   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  Normal bowel gas pattern.  No bowel dilatation or bowel wall thickening.  Osseous structures unremarkable.  No urinary tract calcification.  IMPRESSION: Normal bowel gas pattern.   Electronically Signed   By: Ulyses Southward M.D.   On: 10/07/2014 09:40   Dg Abd Portable 1v  10/02/2014   CLINICAL DATA:  OG tube insertion  EXAM: PORTABLE ABDOMEN - 1 VIEW  COMPARISON:  None.  FINDINGS: Enteric tube tip and side port projects over the expected location of the gastric  fundus.  Paucity of bowel gas without definite evidence of obstruction. No supine evidence of pneumoperitoneum. No definite pneumatosis or portal venous gas.  Limited visualization the lower thorax demonstrates enlargement of the cardiac silhouette and epicardial pacer pads overlying the left ventricular apex.  IMPRESSION: Enteric tube tip and side port overlie the gastric fundus.   Electronically Signed   By: Simonne Come M.D.   On: 10/02/2014 22:29   Mr Card Morphology Wo/w Cm  10/13/2014   CLINICAL DATA:  History of cardiac arrest/VT, ischemic cardiomyopathy, assess for viability.  EXAM: CARDIAC MRI  TECHNIQUE: The patient was scanned on a 1.5 Tesla GE magnet. A dedicated cardiac coil was used. Functional imaging was done using Fiesta sequences. 2,3, and 4 chamber views were done to assess for RWMA's. Modified Simpson's rule using a short axis stack was used to calculate an ejection fraction on a dedicated work Research officer, trade [employer]. The patient received 37 cc of Multihance. After 10 minutes inversion recovery sequences were used to assess for infiltration and scar tissue.  CONTRAST:  37 mL Multihance  FINDINGS: Limited views of the lung fields showed bilateral small L>R pleural effusions. There was adjacent atelectasis on the left. There was a small pericardial effusion.  The left ventricle was moderately dilated. Normal wall thickness. There was akinesis of the mid to apical anteroseptal and inferoseptal walls, the true apex, the apical inferior wall, and the apical lateral wall. EF was calculated to be 40%. Normal right ventricular size and systolic function. Normal right and left atrial sizes. The aortic valve was bicuspid with probably mild aortic stenosis (this is better assessed by echo). The ascending aorta was not fully viewed but did not appear to be significantly dilated. There was no significant mitral stenosis noted.  On delayed enhancement imaging, there was 76-99% wall thickness  subendocardial delayed enhancement in the mid to apical anteroseptal wall and in the apex. There did not appear to be significant delayed enhancement in other wall segments.  MEASUREMENTS: LV EDV 284 mL  LV SV 115 mL  LV EF 40%  IMPRESSION: 1. Moderately dilated LV with EF 40%. Wall motion abnormalities as noted above.  2.  Normal RV size and systolic function.  3. Bicuspid aortic valve without significant dilatation of the ascending aorta. Probably mild aortic stenosis.  4. Delayed enhancement pattern suggesting that the mid to apical anteroseptal wall and the apex are unlikely to improve significantly in function with revascularization (nonviable). There did not appear to be significant scar in the remainder of the LV wall segments.  Dalton Mclean   Electronically Signed   By: Marca Ancona M.D.   On: 10/13/2014 18:06    Labs: Basic Metabolic Panel:  Recent Labs Lab 10/20/14 1459 10/24/14 1100 10/24/14 1300 10/24/14 2028 10/25/14 0310  NA 136 137 135*  --  136*  K 4.7 7.2* 5.8* 5.2 4.4  CL 97 96 94*  --  95*  CO2 32 26 25  --  29  GLUCOSE 133* 116* 123*  --  145*  BUN 43* 74* 74*  --  67*  CREATININE 2.1* 2.25* 2.30*  --  2.18*  CALCIUM 8.6 9.6 9.2  --  8.6   Liver Function Tests:  Recent Labs Lab 10/24/14 1100  AST 28  ALT 16  ALKPHOS 242*  BILITOT 1.0  PROT 6.9  ALBUMIN 3.5   CBC:  Recent Labs Lab 10/20/14 1459 10/24/14 1100 10/24/14 1300 10/25/14 0310  WBC 4.0 5.9 4.7 3.2*  NEUTROABS 2.8  --  3.5  --   HGB 10.2* 11.1* 10.2* 9.7*  HCT 31.4* 34.3* 31.2* 30.2*  MCV 91.6 93.0 93.4 91.0  PLT 66.0* 69* 62* 51*   Cardiac Enzymes:  Recent Labs Lab 10/24/14 1300  TROPONINI <0.30   BNP: BNP (last 3 results)  Recent Labs  10/14/14 0545 10/16/14 0323 10/24/14 1041  PROBNP 14218.0* 16361.0* 5640.0*   CBG:  Recent Labs Lab 10/24/14 1851 10/25/14 0142 10/25/14 0354 10/25/14 0748 10/25/14 1151  GLUCAP 178* 150* 148* 131* 318*        Signed:  Devonne Kitchen  [facility] 10/25/2014, 3:57 PM

## 2014-10-25 NOTE — Discharge Instructions (Signed)
You were cared for by a hospitalist during your hospital stay. If you have any questions about your discharge medications or the care you received while you were in the hospital after you are discharged, you can call the unit and asked to speak with the hospitalist on call if the hospitalist that took care of you is not available. Once you are discharged, your primary care physician will handle any further medical issues. Please note that NO REFILLS for any discharge medications will be authorized once you are discharged, as it is imperative that you return to your primary care physician (or establish a relationship with a primary care physician if you do not have one) for your aftercare needs so that they can reassess your need for medications and monitor your lab values.     If you do not have a primary care physician, you can call [phone] for a physician referral.  Follow with Primary [name] [name],[name], [name] in 5-7 days   Follow up     Get CBC, CMP checked by your doctor and again as further instructed.  Get a 2 view Chest X ray done next visit if you had Pneumonia of Lung problems at the Hospital.  Get Medicines reviewed and adjusted.  Please request your Prim.[name] to go over all Hospital Tests and Procedure/Radiological results at the follow up, please get all Hospital records sent to your Prim [name] by signing hospital release before you go home.  Activity: As tolerated with Full fall precautions use walker/cane & assistance as needed  Diet: heart healthy, low sodium, fluid restriction  CHF  Discharge instructions  DIET  Low sodium Heart Healthy Diet with fluid restriction 1500cc/day  ACTIVITY  Avoid strenuous activity  WEIGH DAILY AT SAME TIME . CALL YOUR DOCTOR IF WEIGHT INCREASES BY MORE THAN 3  POUNDS IN 2 DAYS  CALL YOUR DOCTOR OR COME TO EMERGENCY ROOM IF WORSENING SHORTNESS OF BREATH OR  CHEST PAIN OR SWELLING  F/U with PMD in 1 week to recheck labs and adjust fluid pills as  needed  If you smoke cigarettes or use any tobacco products you are advised to stop. Please ask nurse for any written materials  Or additional information you want regarding smoking cessation   For Heart failure patients - Check your Weight same time everyday, if you gain over 2 pounds, or you develop in leg swelling, experience more shortness of breath or chest pain, call your Primary [name] immediately. Follow Cardiac Low Salt Diet and 1.8 lit/day fluid restriction.  Disposition Home  If you experience worsening of your admission symptoms, develop shortness of breath, life threatening emergency, suicidal or homicidal thoughts you must seek medical attention immediately by calling 911 or calling your [name] immediately  if symptoms less severe.  You Must read complete instructions/literature along with all the possible adverse reactions/side effects for all the Medicines you take and that have been prescribed to you. Take any new Medicines after you have completely understood and accpet all the possible adverse reactions/side effects.   Do not drive and provide baby sitting services if your were admitted for syncope or siezures until you have seen by Primary [name] or a Neurologist and advised to do so again.  Do not drive when taking Pain medications.   Do not take more than prescribed Pain, Sleep and Anxiety Medications  Special Instructions: If you have smoked or chewed Tobacco  in the last 2 yrs please stop smoking, stop any regular Alcohol  and or  any Recreational drug use.  Wear Seat belts while driving.   Follow up with Heart Clinic on Monday 11/9 at 3:20 pm Weigh daily, if you gain more than 3 lbs in a day please call heart clinic right away Repeat blood work on Monday

## 2014-10-25 NOTE — Progress Notes (Signed)
[name] to be D/C'd Home per MD order.  Discussed with the patient and all questions fully answered.    Medication List    STOP taking these medications        lisinopril 5 MG tablet  Commonly known as:  PRINIVIL,ZESTRIL     spironolactone 25 MG tablet  Commonly known as:  ALDACTONE      TAKE these medications        acetaminophen 500 MG tablet  Commonly known as:  TYLENOL  Take 500 mg by mouth every 6 (six) hours as needed for moderate pain.     aspirin 81 MG chewable tablet  Chew 1 tablet (81 mg total) by mouth daily.     atorvastatin 20 MG tablet  Commonly known as:  LIPITOR  Take 1 tablet (20 mg total) by mouth daily at 6 PM.     carvedilol 6.25 MG tablet  Commonly known as:  COREG  Take 1 tablet (6.25 mg total) by mouth 2 (two) times daily with a meal.     clopidogrel 75 MG tablet  Commonly known as:  PLAVIX  Take 1 tablet (75 mg total) by mouth daily.     furosemide 80 MG tablet  Commonly known as:  LASIX  Take 0.5 tablets (40 mg total) by mouth daily as needed for fluid or edema.     glucagon 1 MG injection  Inject 1 mg into the vein once as needed. For hypoglycemia     insulin aspart 100 UNIT/ML injection  Commonly known as:  novoLOG  Inject 0-10 Units into the skin 3 (three) times daily before meals. Sliding scale     insulin glargine 100 UNIT/ML injection  Commonly known as:  LANTUS  - Inject 0.22 mLs (22 Units total) into the skin daily. Patient advised to take insulin in the morning after working nights;   - If working first or second shifts, takes insulin at bedtime     lidocaine 5 %  Commonly known as:  LIDODERM  Place 1 patch onto the skin daily. Remove & Discard patch within 12 hours or as directed by MD     nitroGLYCERIN 0.4 MG SL tablet  Commonly known as:  NITROSTAT  Place 1 tablet (0.4 mg total) under the tongue every 5 (five) minutes as needed for chest pain.     pantoprazole 40 MG tablet  Commonly known as:  PROTONIX  Take 1  tablet (40 mg total) by mouth daily.     ranitidine 150 MG tablet  Commonly known as:  ZANTAC  Take 150 mg by mouth daily as needed for heartburn.     traMADol 50 MG tablet  Commonly known as:  ULTRAM  Take 1 tablet (50 mg total) by mouth every 6 (six) hours as needed for moderate pain.        VVS, Skin clean, dry and intact without evidence of skin break down, no evidence of skin tears noted. IV catheter discontinued intact. Site without signs and symptoms of complications. Dressing and pressure applied.  An After Visit Summary was printed and given to the patient.  D/c education completed with patient/family including follow up instructions, medication list, d/c activities limitations if indicated, with other d/c instructions as indicated by MD - patient able to verbalize understanding, all questions fully answered.   Patient instructed to return to ED, call 911, or call MD for any changes in condition.   Patient escorted via WC, and D/C home via private auto.  [name] D 10/25/2014 2:43 PM

## 2014-10-25 NOTE — Plan of Care (Signed)
Problem: Food- and Nutrition-Related Knowledge Deficit (NB-1.1) Goal: Nutrition education Formal process to instruct or train a patient/client in a skill or to impart knowledge to help patients/clients voluntarily manage or modify food choices and eating behavior to maintain or improve health. Outcome: Completed/Met Date Met:  10/25/14 Nutrition Education Note  RD consulted for nutrition education regarding a Heart Healthy diet.   Lipid Panel     Component Value Date/Time    CHOL 106 10/10/2014 0430    TRIG 96 10/10/2014 0430    HDL 20* 10/10/2014 0430    CHOLHDL 5.3 10/10/2014 0430    VLDL 19 10/10/2014 0430    LDLCALC 67 10/10/2014 0430    RD provided "Heart Healthy Nutrition Therapy" handout from the Academy of [employer]etics. Reviewed patient's dietary recall. Provided examples on ways to decrease sodium and fat intake in diet. Discouraged intake of processed foods and use of salt shaker. Encouraged fresh fruits and vegetables as well as whole grain sources of carbohydrates to maximize fiber intake. Teach back method used.  Expect good compliance.  Body mass index is 30.56 kg/(m^2). Pt meets criteria for obesity based on current BMI.  Current diet order is carbohydrate modified/heart healthy, patient is consuming approximately 100% of meals at this time. Labs and medications reviewed. No further nutrition interventions warranted at this time. RD contact information provided. If additional nutrition issues arise, please re-consult RD.  Laurette Schimke RD, LDN

## 2014-10-25 NOTE — Care Management Note (Signed)
    Page 1 of 1   10/25/2014     4:21:44 PM CARE MANAGEMENT NOTE 10/25/2014  Patient:  [name], [name]   Account Number:  [id number]  Date Initiated:  10/25/2014  Documentation initiated by:  [facility]  Subjective/Objective Assessment:   adm: Hyperkalemia     Action/Plan:   dishcarge planning   Anticipated DC Date:  10/25/2014   Anticipated DC Plan:  HOME W HOME HEALTH SERVICES      DC Planning Services  CM consult      [facility] Choice  Resumption Of Svcs/PTA Provider   Choice offered to / List presented to:          [facility] arranged  HH-1 RN  HH-2 PT      Status of service:  Completed, signed off Medicare Important Message given?   (If response is "NO", the following Medicare IM given date fields will be blank) Date Medicare IM given:   Medicare IM given by:   Date Additional Medicare IM given:   Additional Medicare IM given by:    Discharge Disposition:  HOME W HOME HEALTH SERVICES  Per UR Regulation:    If discussed at Long Length of Stay Meetings, dates discussed:    Comments:  10/25/14 16:00 CM received call from RN stating pt active with [facility] for HHRN/PT.  CM requested HH resumption orders be placed.  CM called referral to [facility] rep, Miranda stating orders and face to face to follow.  CM now notes orders and F2F placed.  Cm called [facility] rep, Miranda to confirm arrangements.  miranda confirms.  No other CM needs were communicated.  Freddy Jaksch, BSN, CM [phone].

## 2014-10-27 ENCOUNTER — Encounter (HOSPITAL_COMMUNITY): Payer: Self-pay

## 2014-10-27 ENCOUNTER — Ambulatory Visit (HOSPITAL_COMMUNITY)
Admission: RE | Admit: 2014-10-27 | Discharge: 2014-10-27 | Disposition: A | Discharge status: Home / Self Care | Payer: 59 | Source: Ambulatory Visit | Attending: Internal Medicine | Admitting: Internal Medicine

## 2014-10-27 VITALS — BP 134/76 | HR 60 | Wt 222.5 lb

## 2014-10-27 DIAGNOSIS — I2510 Atherosclerotic heart disease of native coronary artery without angina pectoris: Secondary | ICD-10-CM | POA: Diagnosis not present

## 2014-10-27 DIAGNOSIS — Z794 Long term (current) use of insulin: Secondary | ICD-10-CM | POA: Insufficient documentation

## 2014-10-27 DIAGNOSIS — S37009A Unspecified injury of unspecified kidney, initial encounter: Secondary | ICD-10-CM | POA: Diagnosis not present

## 2014-10-27 DIAGNOSIS — Q231 Congenital insufficiency of aortic valve: Secondary | ICD-10-CM | POA: Diagnosis not present

## 2014-10-27 DIAGNOSIS — D696 Thrombocytopenia, unspecified: Secondary | ICD-10-CM | POA: Insufficient documentation

## 2014-10-27 DIAGNOSIS — E119 Type 2 diabetes mellitus without complications: Secondary | ICD-10-CM | POA: Insufficient documentation

## 2014-10-27 DIAGNOSIS — N179 Acute kidney failure, unspecified: Secondary | ICD-10-CM | POA: Insufficient documentation

## 2014-10-27 DIAGNOSIS — Z8674 Personal history of sudden cardiac arrest: Secondary | ICD-10-CM | POA: Diagnosis not present

## 2014-10-27 DIAGNOSIS — I255 Ischemic cardiomyopathy: Secondary | ICD-10-CM | POA: Insufficient documentation

## 2014-10-27 DIAGNOSIS — Z955 Presence of coronary angioplasty implant and graft: Secondary | ICD-10-CM | POA: Insufficient documentation

## 2014-10-27 DIAGNOSIS — Z7902 Long term (current) use of antithrombotics/antiplatelets: Secondary | ICD-10-CM | POA: Insufficient documentation

## 2014-10-27 DIAGNOSIS — E875 Hyperkalemia: Secondary | ICD-10-CM | POA: Diagnosis not present

## 2014-10-27 DIAGNOSIS — K7460 Unspecified cirrhosis of liver: Secondary | ICD-10-CM | POA: Diagnosis not present

## 2014-10-27 DIAGNOSIS — I5022 Chronic systolic (congestive) heart failure: Secondary | ICD-10-CM | POA: Diagnosis present

## 2014-10-27 DIAGNOSIS — N189 Chronic kidney disease, unspecified: Secondary | ICD-10-CM | POA: Diagnosis not present

## 2014-10-27 DIAGNOSIS — R161 Splenomegaly, not elsewhere classified: Secondary | ICD-10-CM | POA: Insufficient documentation

## 2014-10-27 DIAGNOSIS — Z79899 Other long term (current) drug therapy: Secondary | ICD-10-CM | POA: Diagnosis not present

## 2014-10-27 DIAGNOSIS — X58XXXA Exposure to other specified factors, initial encounter: Secondary | ICD-10-CM | POA: Insufficient documentation

## 2014-10-27 DIAGNOSIS — I10 Essential (primary) hypertension: Secondary | ICD-10-CM

## 2014-10-27 DIAGNOSIS — Z7982 Long term (current) use of aspirin: Secondary | ICD-10-CM | POA: Diagnosis not present

## 2014-10-27 LAB — HEMOGLOBIN A1C
HEMOGLOBIN A1C: 5.8 % — AB (ref <5.7)
Mean Plasma Glucose: 120 mg/dL — ABNORMAL HIGH (ref <117)

## 2014-10-27 MED ORDER — CLOPIDOGREL BISULFATE 75 MG PO TABS: 75.0000 mg | ORAL_TABLET | Freq: Every day | ORAL | Status: DC

## 2014-10-27 MED ORDER — LISINOPRIL 10 MG PO TABS: 10.0000 mg | ORAL_TABLET | Freq: Every day | ORAL | Status: DC

## 2014-10-27 MED ORDER — FUROSEMIDE 40 MG PO TABS: 40.0000 mg | ORAL_TABLET | Freq: Every day | ORAL | Status: DC | PRN

## 2014-10-27 NOTE — Progress Notes (Signed)
Patient ID: [name], male   DOB: 09-23-1961, 53 y.o.   MRN: [id number] PCP: Dr. [name]  53 yo with history of CAD s/p recent PCI, cirrhosis with thrombocytopenia, recent cardiac arrest, and ischemic cardiomyopathy presents for cardiology followup.  Patient was admitted in 7/15-8/15 with gallstone pancreatitis.  It was decided not to do a cholecystectomy due to high surgical risk.  He was sent home and went back to work.  In 10/15, he had a cardiac arrest at work and was defibrillated by AED.  He was cooled and recovered well.  Platelets were initially very low but recovered to his baseline in the 60K range.  Cardiac cath was done showing severe 2 vessel disease.  Cardiac MRI was done, showing EF 40% with a mixed viability picture.  With recovery of platelets, he had Xience DES to the LAD and the distal RCA.  He had acute on chronic systolic CHF with volume overload and was diuresed and eventually discharge.  Unfortunately, he was sent home on daily metolazone.    Follow up for Heart Failure: Patient was seen last week in clinic and K+ was 7.2 and sent to [facility]. They gave him kayexalate and admitted him to [facility]. He received IV fluids. His lisinopril and spironolactone were stopped and lasix placed on hold. Denies SOB, dizziness, orthopnea or CP. Weight at home 216 lbs. Following a low salt diet and drinking less than 2L a day. When originally discharged weight at home was 233 lbs.   Labs (11/15): K 4.7 => 7.2, creatinine 1.37 => 2.1 => 2.25 with BUN 94, HCT 26.9, plts 67  ECG: NSR, old anterior MI  PMH: 1. 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  2. Thrombocytopenia: Chronic.  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   3. Gallstone pancreatitis: He did not have cholecystectomy as deemed too high a surgical risk.  4. CKD 5. Type II diabetes 6. Bicuspid aortic valve: With mild AS and mild AI on last echo in 8/15.  7. H/o perforated diverticulum.  8.  CAD: Cardiac arrest 10/15.  LHC (10/15) with 80-90% proximal ramus stenosis, 90% mLAD, subtotaled distal LAD, 90% D1, 90% dRCA/proximal PDA.  Patient had Xience DES to mLAD, dRCA.  9. Cardiac arrest: 10/15, defibrillated with AED and cooled.  10. Ischemic cardiomyopathy: Echo (8/15) with EF 35-40%, bicuspid aortic valve with mild AS/AI.  Cardiac MRI (10/15) with EF 40%, regional wall motion abnormalities, normal RV, delayed enhancement pattern suggesting mixed picture of viability.   SH: Never smoked.  Prior ETOH abuse, now stopped.  Works at [employer] and [employer].  Lives with sister in [place].   FH: No premature CAD.   ROS: All systems reviewed and negative except as per HPI.   Current Outpatient Prescriptions  Medication Sig Dispense Refill  . acetaminophen (TYLENOL) 500 MG tablet Take 500 mg by mouth every 6 (six) hours as needed for moderate pain.    [name] aspirin 81 MG chewable tablet Chew 1 tablet (81 mg total) by mouth daily.    [name] atorvastatin (LIPITOR) 20 MG tablet Take 1 tablet (20 mg total) by mouth daily at 6 PM. 30 tablet 0  . carvedilol (COREG) 6.25 MG tablet Take 1 tablet (6.25 mg total) by mouth 2 (two) times daily with a meal. 60 tablet 0  . clopidogrel (PLAVIX) 75 MG tablet Take 1 tablet (75 mg total) by mouth daily. 30 tablet 0  . glucagon 1 MG injection Inject 1 mg into the  vein once as needed. For hypoglycemia 1 each 2  . insulin aspart (NOVOLOG) 100 UNIT/ML injection Inject 0-10 Units into the skin 3 (three) times daily before meals. Sliding scale    . insulin glargine (LANTUS) 100 UNIT/ML injection Inject 0.22 mLs (22 Units total) into the skin daily. Patient advised to take insulin in the morning after working nights;  If working first or second shifts, takes insulin at bedtime 10 mL 11  . lidocaine (LIDODERM) 5 % Place 1 patch onto the skin daily. Remove & Discard patch within 12 hours or as directed by MD 30 patch 0  . nitroGLYCERIN (NITROSTAT) 0.4 MG SL tablet Place 1  tablet (0.4 mg total) under the tongue every 5 (five) minutes as needed for chest pain. 25 tablet 0  . pantoprazole (PROTONIX) 40 MG tablet Take 1 tablet (40 mg total) by mouth daily. 30 tablet 3  . ranitidine (ZANTAC) 150 MG tablet Take 150 mg by mouth daily as needed for heartburn.    . traMADol (ULTRAM) 50 MG tablet Take 1 tablet (50 mg total) by mouth every 6 (six) hours as needed for moderate pain. 30 tablet 0   No current facility-administered medications for this encounter.   BP 134/76 mmHg  Pulse 60  Wt 222 lb 8 oz (100.925 kg)  SpO2 99% General: NAD Neck: No JVD, no thyromegaly or thyroid nodule.  Lungs: Decreased breath sounds at bases bilaterally.  CV: Nondisplaced PMI.  Heart regular S1/S2, no S3/S4, 2/6 early SEM RUSB.  No peripheral edema.  No carotid bruit.  Normal pedal pulses.  Abdomen: Soft, nontender, no hepatosplenomegaly, no distention.  Skin: Intact without lesions or rashes.  Neurologic: Alert and oriented x 3.  Psych: Normal affect. Extremities: No clubbing or cyanosis.  HEENT: Normal.   Assessment/Plan: 1. Chronic systolic CHF: EF 16%40% by cardiac MRI, ischemic cardiomyopathy.  He is not volume overloaded on exam, probably still somewhat dry.  He was sent home from the hospital on daily metolazone 2.5 mg daily with Lasix 80 mg bid.  He developed volume depletion, renal failure and hyperkalemia. Remains off all diuretics, spiro and lisinopril. - Will restart lisinopril at low dose - 10 mg daily. Recheck BMET on Friday - Take lasix 40 mg daily only for weight of 225 or greater - Continue to hold spiro - Continue current Coreg.  - Will need echo in 3 months, if EF < 35% will need ICD (hopefully will not be).  2. CAD: Status post Xience DES to LAD and RCA after cardiac arrest.  Will need to follow closely due to bleeding risk on DAPT with chronic thrombocytopenia.  His platelets recently have been stable in the 60K range.  - Continue statin, transaminases have not  been elevated.  - Continue ASA 81 and Plavix for now.  P2Y12 was 241 will continue ASA 81 and Plavix 75 daily.  - Refuses cardiac rehab due to transportation. 3. Cirrhosis: Likely related to ETOH.  Has associated splenomegaly and chronic thrombocytopenia.  4. Thrombocytopenia: Chronic, likely due to splenomegaly. Will need to follow plts/hemoglobin over time closely.  5. AKI/hyperkalemia: Improving. See changes as above. BMET on Friday with [facility].   Ulla PotashCosgrove, Ali B 10/27/2014 4:05 PM  Patient seen and examined with Ulla PotashAli Cosgrove, NP. We discussed all aspects of the encounter. I agree with the assessment and plan as stated above. I have edited the note fully with my changes. Restart lisinopril.   Peggy Monk,MD 4:18 PM

## 2014-10-27 NOTE — Patient Instructions (Signed)
CHANGE Lasix to 40 mg for weight greater than 225lbs START Lisinopril 10 mg daily  Your physician recommends that you schedule a follow-up appointment in: 1 month  Do the following things EVERYDAY: 1) Weigh yourself in the morning before breakfast. Write it down and keep it in a log. 2) Take your medicines as prescribed 3) Eat low salt foods-Limit salt (sodium) to 2000 mg per day.  4) Stay as active as you can everyday 5) Limit all fluids for the day to less than 2 liters 6)

## 2014-10-31 ENCOUNTER — Other Ambulatory Visit: Payer: Self-pay | Admitting: Family Medicine

## 2014-10-31 MED ORDER — GLUCOSE BLOOD VI STRP: ORAL_STRIP | Status: DC

## 2014-11-03 ENCOUNTER — Telehealth (HOSPITAL_COMMUNITY): Payer: Self-pay

## 2014-11-03 DIAGNOSIS — I5022 Chronic systolic (congestive) heart failure: Secondary | ICD-10-CM | POA: Insufficient documentation

## 2014-11-03 NOTE — Telephone Encounter (Signed)
Lab results reviewed with patient.  Instructed to stop taking lisinopril.  Aware and agreeable.  Ave Filter

## 2014-11-04 DIAGNOSIS — I5023 Acute on chronic systolic (congestive) heart failure: Secondary | ICD-10-CM

## 2014-11-04 DIAGNOSIS — I2510 Atherosclerotic heart disease of native coronary artery without angina pectoris: Secondary | ICD-10-CM

## 2014-11-04 DIAGNOSIS — I222 Subsequent non-ST elevation (NSTEMI) myocardial infarction: Secondary | ICD-10-CM

## 2014-11-04 DIAGNOSIS — I10 Essential (primary) hypertension: Secondary | ICD-10-CM

## 2014-11-05 ENCOUNTER — Telehealth: Payer: Self-pay | Admitting: Family Medicine

## 2014-11-05 MED ORDER — GLUCOSE BLOOD VI STRP: ORAL_STRIP | Status: DC

## 2014-11-05 NOTE — Telephone Encounter (Signed)
Strips sent into [facility].  Pt aware.

## 2014-11-05 NOTE — Telephone Encounter (Signed)
Needs refill on test strips. Please call to let him know they have been called in./DH

## 2014-11-07 ENCOUNTER — Encounter (HOSPITAL_COMMUNITY): Payer: 59

## 2014-11-11 ENCOUNTER — Other Ambulatory Visit: Payer: Self-pay | Admitting: Family Medicine

## 2014-11-11 DIAGNOSIS — E875 Hyperkalemia: Secondary | ICD-10-CM

## 2014-11-11 LAB — BASIC METABOLIC PANEL
BUN: 43 mg/dL — ABNORMAL HIGH (ref 6–23)
CALCIUM: 9 mg/dL (ref 8.4–10.5)
CHLORIDE: 109 meq/L (ref 96–112)
CO2: 23 meq/L (ref 19–32)
Creat: 1.2 mg/dL (ref 0.50–1.35)
Glucose, Bld: 129 mg/dL — ABNORMAL HIGH (ref 70–99)
POTASSIUM: 5.2 meq/L (ref 3.5–5.3)
SODIUM: 137 meq/L (ref 135–145)

## 2014-11-12 ENCOUNTER — Encounter: Payer: Self-pay | Admitting: Nurse Practitioner

## 2014-11-12 ENCOUNTER — Ambulatory Visit (INDEPENDENT_AMBULATORY_CARE_PROVIDER_SITE_OTHER): Payer: 59 | Admitting: Nurse Practitioner

## 2014-11-12 VITALS — BP 122/70 | HR 64 | Temp 98.5°F | Resp 18 | Ht 70.0 in | Wt 219.0 lb

## 2014-11-12 DIAGNOSIS — L03113 Cellulitis of right upper limb: Secondary | ICD-10-CM

## 2014-11-12 MED ORDER — CEPHALEXIN 500 MG PO CAPS: 500.0000 mg | ORAL_CAPSULE | Freq: Two times a day (BID) | ORAL | Status: DC

## 2014-11-12 NOTE — Progress Notes (Signed)
Pre visit review using our clinic review tool, if applicable. No additional management support is needed unless otherwise documented below in the visit note. 

## 2014-11-12 NOTE — Progress Notes (Signed)
   Subjective:    Patient ID: [name], male    DOB: Dec 06, 1961, 53 y.o.   MRN: [id number]  HPI Comments: Pt treated 1 mo ago for cardiac arrest. Cardiac cath showed 3 vessel disease. He is accompanied by sister today.  Rash This is a new problem. The current episode started in the past 7 days (3d). The problem has been gradually improving since onset. The affected locations include the right wrist. The rash is characterized by pain, redness and swelling. Associated with: pt had cardiac cath via radial artery 1 mo ago. Pt thinks rash is at cath entry. Pertinent negatives include no cough, fatigue, fever or shortness of breath. Past treatments include antibiotic cream. The treatment provided mild (warmth resolved 24 hrs after using neosporin) relief.      Review of Systems  Constitutional: Negative for fever, chills and fatigue.  Respiratory: Negative for cough, chest tightness and shortness of breath.   Gastrointestinal: Negative for abdominal pain.  Skin: Positive for rash.  Neurological: Negative for headaches.  Hematological: Negative for adenopathy.       Objective:   Physical Exam  Constitutional: He is oriented to person, place, and time. He appears well-developed. No distress.  HENT:  Head: Normocephalic and atraumatic.  Eyes: Conjunctivae are normal. Right eye exhibits no discharge. Left eye exhibits no discharge.  Pulmonary/Chest: Effort normal. No respiratory distress.  Neurological: He is alert and oriented to person, place, and time.  Skin: Skin is warm and dry. Rash noted.  2 mm scab volar R wrist w/ surrounding erythema about .5 cm diam. Lifted scab off easily with 23 ga needle bevel. No foreign body observed. Small central, likely was catheter entry wound. No apparent phlebitis. Infection very localized.  Psychiatric: He has a normal mood and affect. His behavior is normal. Thought content normal.  Vitals reviewed.         Assessment & Plan:  1. Cellulitis  of right upper extremity [L03.113] Appears to be site of cardiac cath. - cephALEXin (KEFLEX) 500 MG capsule; Take 1 capsule (500 mg total) by mouth 2 (two) times daily.  Dispense: 14 capsule; Refill: 0  See patient instructions for complete plan. F/u 1 week

## 2014-11-12 NOTE — Patient Instructions (Addendum)
  Start antibiotic. Eat yogurt daily to help prevent antibiotic -associated diarrhea. Wound care: wash daily with mild soap-dove bar soap. Pat dry. Apply thin layer vaseline or neosporin & cover with band-aid. Let us know if you have signs of increasing infection: red area gets bigger, more pain, drainage, red streaks, fever. Please follow up in 1 week.   Cellulitis Cellulitis is an infection of the skin and the tissue beneath it. The infected area is usually red and tender. Cellulitis occurs most often in the arms and lower legs.  CAUSES  Cellulitis is caused by bacteria that enter the skin through cracks or cuts in the skin. The most common types of bacteria that cause cellulitis are staphylococci and streptococci. SIGNS AND SYMPTOMS   Redness and warmth.  Swelling.  Tenderness or pain.  Fever. DIAGNOSIS  Your health care provider can usually determine what is wrong based on a physical exam. Blood tests may also be done. TREATMENT  Treatment usually involves taking an antibiotic medicine. HOME CARE INSTRUCTIONS   Take your antibiotic medicine as directed by your health care provider. Finish the antibiotic even if you start to feel better.  Keep the infected arm or leg elevated to reduce swelling.  Apply a warm cloth to the affected area up to 4 times per day to relieve pain.  Take medicines only as directed by your health care provider.  Keep all follow-up visits as directed by your health care provider. SEEK MEDICAL CARE IF:   You notice red streaks coming from the infected area.  Your red area gets larger or turns dark in color.  Your bone or joint underneath the infected area becomes painful after the skin has healed.  Your infection returns in the same area or another area.  You notice a swollen bump in the infected area.  You develop new symptoms.  You have a fever. SEEK IMMEDIATE MEDICAL CARE IF:   You feel very sleepy.  You develop vomiting or  diarrhea.  You have a general ill feeling (malaise) with muscle aches and pains. MAKE SURE YOU:   Understand these instructions.  Will watch your condition.  Will get help right away if you are not doing well or get worse. Document Released: 09/14/2005 Document Revised: 04/21/2014 Document Reviewed: 02/20/2012 [facility] Patient Information 2015 [place], [place]. This information is not intended to replace advice given to you by your health care provider. Make sure you discuss any questions you have with your health care provider.

## 2014-11-17 ENCOUNTER — Encounter: Payer: 59 | Admitting: Internal Medicine

## 2014-11-17 ENCOUNTER — Telehealth: Payer: Self-pay | Admitting: Family Medicine

## 2014-11-17 DIAGNOSIS — I2510 Atherosclerotic heart disease of native coronary artery without angina pectoris: Secondary | ICD-10-CM

## 2014-11-17 DIAGNOSIS — L03113 Cellulitis of right upper limb: Secondary | ICD-10-CM

## 2014-11-17 MED ORDER — CLOPIDOGREL BISULFATE 75 MG PO TABS: 75.0000 mg | ORAL_TABLET | Freq: Every day | ORAL | Status: DC

## 2014-11-17 MED ORDER — CARVEDILOL 6.25 MG PO TABS: 6.2500 mg | ORAL_TABLET | Freq: Two times a day (BID) | ORAL | Status: DC

## 2014-11-17 MED ORDER — ATORVASTATIN CALCIUM 20 MG PO TABS: 20.0000 mg | ORAL_TABLET | Freq: Every day | ORAL | Status: DC

## 2014-11-17 NOTE — Telephone Encounter (Signed)
Patient called requesting RF of tramadol.  Last RF was 10/18/14 with no refills.  Please advise.

## 2014-11-17 NOTE — Telephone Encounter (Signed)
This med was ordered by Leotis Shames McLung-hospitalist. I would ask Dr [name] if he wants to continue med. It was probably given for post-procedure pain.

## 2014-11-17 NOTE — Telephone Encounter (Signed)
Please advise 

## 2014-11-18 MED ORDER — TRAMADOL HCL 50 MG PO TABS: 50.0000 mg | ORAL_TABLET | Freq: Four times a day (QID) | ORAL | Status: DC | PRN

## 2014-11-18 NOTE — Telephone Encounter (Signed)
Rx faxed. Patient aware.  

## 2014-11-18 NOTE — Telephone Encounter (Signed)
Tramadol rx printed. 

## 2014-11-19 ENCOUNTER — Ambulatory Visit: Payer: 59 | Admitting: Family Medicine

## 2014-11-20 ENCOUNTER — Encounter (HOSPITAL_COMMUNITY): Payer: Self-pay

## 2014-11-20 ENCOUNTER — Ambulatory Visit (HOSPITAL_COMMUNITY)
Admission: RE | Admit: 2014-11-20 | Discharge: 2014-11-20 | Disposition: A | Discharge status: Home / Self Care | Payer: 59 | Source: Ambulatory Visit | Attending: Cardiology | Admitting: Cardiology

## 2014-11-20 VITALS — BP 128/72 | HR 72 | Wt 218.0 lb

## 2014-11-20 DIAGNOSIS — I255 Ischemic cardiomyopathy: Secondary | ICD-10-CM | POA: Diagnosis not present

## 2014-11-20 DIAGNOSIS — Z7901 Long term (current) use of anticoagulants: Secondary | ICD-10-CM | POA: Diagnosis not present

## 2014-11-20 DIAGNOSIS — N189 Chronic kidney disease, unspecified: Secondary | ICD-10-CM | POA: Diagnosis not present

## 2014-11-20 DIAGNOSIS — Z794 Long term (current) use of insulin: Secondary | ICD-10-CM | POA: Insufficient documentation

## 2014-11-20 DIAGNOSIS — G8929 Other chronic pain: Secondary | ICD-10-CM | POA: Insufficient documentation

## 2014-11-20 DIAGNOSIS — I252 Old myocardial infarction: Secondary | ICD-10-CM | POA: Insufficient documentation

## 2014-11-20 DIAGNOSIS — R5383 Other fatigue: Secondary | ICD-10-CM | POA: Diagnosis not present

## 2014-11-20 DIAGNOSIS — M545 Low back pain: Secondary | ICD-10-CM | POA: Diagnosis not present

## 2014-11-20 DIAGNOSIS — Z7982 Long term (current) use of aspirin: Secondary | ICD-10-CM | POA: Diagnosis not present

## 2014-11-20 DIAGNOSIS — I5022 Chronic systolic (congestive) heart failure: Secondary | ICD-10-CM

## 2014-11-20 DIAGNOSIS — K851 Biliary acute pancreatitis: Secondary | ICD-10-CM | POA: Diagnosis not present

## 2014-11-20 DIAGNOSIS — I2510 Atherosclerotic heart disease of native coronary artery without angina pectoris: Secondary | ICD-10-CM

## 2014-11-20 DIAGNOSIS — I10 Essential (primary) hypertension: Secondary | ICD-10-CM

## 2014-11-20 DIAGNOSIS — Z79899 Other long term (current) drug therapy: Secondary | ICD-10-CM | POA: Diagnosis not present

## 2014-11-20 DIAGNOSIS — E119 Type 2 diabetes mellitus without complications: Secondary | ICD-10-CM | POA: Diagnosis not present

## 2014-11-20 DIAGNOSIS — Q231 Congenital insufficiency of aortic valve: Secondary | ICD-10-CM | POA: Insufficient documentation

## 2014-11-20 DIAGNOSIS — R161 Splenomegaly, not elsewhere classified: Secondary | ICD-10-CM | POA: Insufficient documentation

## 2014-11-20 DIAGNOSIS — D696 Thrombocytopenia, unspecified: Secondary | ICD-10-CM | POA: Diagnosis not present

## 2014-11-20 DIAGNOSIS — Z8674 Personal history of sudden cardiac arrest: Secondary | ICD-10-CM | POA: Insufficient documentation

## 2014-11-20 DIAGNOSIS — E875 Hyperkalemia: Secondary | ICD-10-CM | POA: Diagnosis not present

## 2014-11-20 DIAGNOSIS — F1021 Alcohol dependence, in remission: Secondary | ICD-10-CM | POA: Insufficient documentation

## 2014-11-20 DIAGNOSIS — K7030 Alcoholic cirrhosis of liver without ascites: Secondary | ICD-10-CM

## 2014-11-20 DIAGNOSIS — K7460 Unspecified cirrhosis of liver: Secondary | ICD-10-CM | POA: Diagnosis not present

## 2014-11-20 LAB — CBC
HEMATOCRIT: 33.2 % — AB (ref 39.0–52.0)
HEMOGLOBIN: 10.9 g/dL — AB (ref 13.0–17.0)
MCH: 29.5 pg (ref 26.0–34.0)
MCHC: 32.8 g/dL (ref 30.0–36.0)
MCV: 89.7 fL (ref 78.0–100.0)
Platelets: 45 10*3/uL — ABNORMAL LOW (ref 150–400)
RBC: 3.7 MIL/uL — ABNORMAL LOW (ref 4.22–5.81)
RDW: 14 % (ref 11.5–15.5)
WBC: 4.3 10*3/uL (ref 4.0–10.5)

## 2014-11-20 LAB — BASIC METABOLIC PANEL
Anion gap: 12 (ref 5–15)
BUN: 40 mg/dL — ABNORMAL HIGH (ref 6–23)
CO2: 21 meq/L (ref 19–32)
CREATININE: 1.16 mg/dL (ref 0.50–1.35)
Calcium: 9.4 mg/dL (ref 8.4–10.5)
Chloride: 106 mEq/L (ref 96–112)
GFR calc Af Amer: 82 mL/min — ABNORMAL LOW (ref >90)
GFR calc non Af Amer: 71 mL/min — ABNORMAL LOW (ref >90)
GLUCOSE: 109 mg/dL — AB (ref 70–99)
Potassium: 6.2 mEq/L — ABNORMAL HIGH (ref 3.7–5.3)
Sodium: 139 mEq/L (ref 137–147)

## 2014-11-20 MED ORDER — NITROGLYCERIN 0.4 MG SL SUBL: 0.4000 mg | SUBLINGUAL_TABLET | SUBLINGUAL | Status: DC | PRN

## 2014-11-20 MED ORDER — SPIRONOLACTONE 25 MG PO TABS: 12.5000 mg | ORAL_TABLET | Freq: Every day | ORAL | Status: DC

## 2014-11-20 MED ORDER — FUROSEMIDE 40 MG PO TABS: 40.0000 mg | ORAL_TABLET | Freq: Every day | ORAL | Status: DC | PRN

## 2014-11-20 MED ORDER — LISINOPRIL 5 MG PO TABS: 5.0000 mg | ORAL_TABLET | Freq: Every day | ORAL | Status: DC

## 2014-11-20 NOTE — Patient Instructions (Signed)
Decrease Spironolactone to 1/2 tab (12.5 mg) daily  Start Lisinopril 5 mg daily  Labs today  Lab in 2 weeks (bmet)  Your physician recommends that you schedule a follow-up appointment in: END OF January WITH ECHOCARDIOGRAM

## 2014-11-21 ENCOUNTER — Telehealth (HOSPITAL_COMMUNITY): Payer: Self-pay

## 2014-11-21 NOTE — Progress Notes (Signed)
Patient ID: [name], male   DOB: 06-23-1961, 53 y.o.   MRN: [id number] PCP: Dr. [name]  53 yo with history of CAD s/p recent PCI, cirrhosis with thrombocytopenia, recent cardiac arrest, and ischemic cardiomyopathy presents for cardiology followup.  Patient was admitted in 7/15-8/15 with gallstone pancreatitis.  It was decided not to do a cholecystectomy due to high surgical risk.  He was sent home and went back to work.  In 10/15, he had a cardiac arrest at work and was defibrillated by AED.  He was cooled and recovered well.  Platelets were initially very low but recovered to his baseline in the 60K range.  Cardiac cath was done showing severe 2 vessel disease.  Cardiac MRI was done, showing EF 40% with a mixed viability picture.  With recovery of platelets, he had Xience DES to the LAD and the distal RCA.  He had acute on chronic systolic CHF with volume overload and was diuresed and eventually discharge.  Unfortunately, he was sent home on daily metolazone and developed AKI.  Diuretics were held and this has gradually improved.  Spironolactone and lisinopril were stopped due to hyperkalemia, but apparently he has still been taking spironolactone.     Symptomatically, he is doing well. No exertional dyspnea or chest pain.  He has had some fatigue.  He is walking daily.  Main complaint has been chronic low back pain.  I planned to try to restart him on a low dose of lisinopril, but K came back high again (?hemolyzed sample).  No melena or BRBPR.   Labs (11/15): K 4.7 => 7.2, creatinine 1.37 => 2.1 => 2.25 with BUN 94, HCT 26.9, plts 67, P2Y12 241 Labs (12/15): K 6.2, creatinine 1.1, HCT 33.2, plts 45  ECG: NSR, old anterior MI  PMH: 1. 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  2. Thrombocytopenia: Chronic.  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   3. Gallstone pancreatitis: He did not have cholecystectomy as deemed too high a surgical risk.  4. CKD 5. Type II  diabetes 6. Bicuspid aortic valve: With mild AS and mild AI on last echo in 8/15.  7. H/o perforated diverticulum.  8. CAD: Cardiac arrest 10/15.  LHC (10/15) with 80-90% proximal ramus stenosis, 90% mLAD, subtotaled distal LAD, 90% D1, 90% dRCA/proximal PDA.  Patient had Xience DES to mLAD, dRCA.  9. Cardiac arrest: 10/15, defibrillated with AED and cooled.  10. Ischemic cardiomyopathy: Echo (8/15) with EF 35-40%, bicuspid aortic valve with mild AS/AI.  Cardiac MRI (10/15) with EF 40%, regional wall motion abnormalities, normal RV, delayed enhancement pattern suggesting mixed picture of viability.   SH: Never smoked.  Prior ETOH abuse, now stopped.  Works at [employer] and [employer].  Lives with sister in [place].   FH: No premature CAD.   ROS: All systems reviewed and negative except as per HPI.   Current Outpatient Prescriptions  Medication Sig Dispense Refill  . acetaminophen (TYLENOL) 500 MG tablet Take 500 mg by mouth every 6 (six) hours as needed for moderate pain.    [name] aspirin 81 MG chewable tablet Chew 1 tablet (81 mg total) by mouth daily.    [name] atorvastatin (LIPITOR) 20 MG tablet Take 1 tablet (20 mg total) by mouth daily at 6 PM. 30 tablet 3  . carvedilol (COREG) 6.25 MG tablet Take 1 tablet (6.25 mg total) by mouth 2 (two) times daily with a meal. 60 tablet 3  . clopidogrel (PLAVIX) 75 MG tablet Take  1 tablet (75 mg total) by mouth daily. 30 tablet 3  . glucagon 1 MG injection Inject 1 mg into the vein once as needed. For hypoglycemia 1 each 2  . glucose blood (FREESTYLE TEST STRIPS) test strip Use as instructed 100 each 6  . insulin aspart (NOVOLOG) 100 UNIT/ML injection Inject 0-10 Units into the skin 3 (three) times daily before meals. Sliding scale    . insulin glargine (LANTUS) 100 UNIT/ML injection Inject 0.22 mLs (22 Units total) into the skin daily. Patient advised to take insulin in the morning after working nights;  If working first or second shifts, takes insulin at  bedtime 10 mL 11  . lidocaine (LIDODERM) 5 % Place 1 patch onto the skin daily. Remove & Discard patch within 12 hours or as directed by MD 30 patch 0  . ranitidine (ZANTAC) 150 MG tablet Take 150 mg by mouth daily as needed for heartburn.    . spironolactone (ALDACTONE) 25 MG tablet Take 0.5 tablets (12.5 mg total) by mouth daily.    . traMADol (ULTRAM) 50 MG tablet Take 1 tablet (50 mg total) by mouth every 6 (six) hours as needed for moderate pain. 30 tablet 0  . furosemide (LASIX) 40 MG tablet Take 1 tablet (40 mg total) by mouth daily as needed for fluid or edema. For weight greater than 225 30 tablet 6  . lisinopril (PRINIVIL,ZESTRIL) 5 MG tablet Take 1 tablet (5 mg total) by mouth daily. 30 tablet 3  . nitroGLYCERIN (NITROSTAT) 0.4 MG SL tablet Place 1 tablet (0.4 mg total) under the tongue every 5 (five) minutes as needed for chest pain. 25 tablet 0   No current facility-administered medications for this encounter.   BP 128/72 mmHg  Pulse 72  Wt 218 lb (98.884 kg)  SpO2 96% General: NAD Neck: No JVD, no thyromegaly or thyroid nodule.  Lungs: Decreased breath sounds at bases bilaterally.  CV: Nondisplaced PMI.  Heart regular S1/S2, no S3/S4, 2/6 early SEM RUSB.  No peripheral edema.  No carotid bruit.  Normal pedal pulses.  Abdomen: Soft, nontender, no hepatosplenomegaly, no distention.  Skin: Intact without lesions or rashes.  Neurologic: Alert and oriented x 3.  Psych: Normal affect. Extremities: No clubbing or cyanosis.  HEENT: Normal.   Assessment/Plan: 1. Chronic systolic CHF: EF 21% by cardiac MRI, ischemic cardiomyopathy.  He is not volume overloaded on exam and has not been taking Lasix.  He has had problems with hyperkalemia but is still taking spironolactone 25 mg daily.  K was 6.2 today but sample may have been hemolyzed.  - Will not restart lisinopril and will have him stop spironolactone.  He will need a repeat BMET to confirm K.  - Take lasix 40 mg daily only for  weight of 225 or greater - Continue current Coreg.  - Will need echo in 1/16, if EF < 35% will need ICD (hopefully will not be).  2. CAD: Status post Xience DES to LAD and RCA after cardiac arrest.  Will need to follow closely due to bleeding risk on DAPT with chronic thrombocytopenia.  Platelets are lower today at 45K.  He is currently on both ASA 81 and Plavix as P2Y12 test has not shown robust platelet inhibition with Plavix (most recently 241 PRU).  No evidence for bleeding, HCT is trending higher.  - Continue statin, transaminases have not been elevated.  - Continue ASA 81 and Plavix for now.  P2Y12 was 241 will continue ASA 81 and Plavix 75  daily.  - Will see if he can get transportation for cardiac rehab. 3. Cirrhosis: Likely related to ETOH.  Has associated splenomegaly and chronic thrombocytopenia.  4. Thrombocytopenia: Chronic, likely due to splenomegaly. Will need to follow plts/hemoglobin over time closely.   Marca AnconaDalton McLean 11/21/2014

## 2014-11-21 NOTE — Telephone Encounter (Signed)
Patient lab results from 11/20/2014 showed K level of 6.2, per Dr. Shirlee Latch instructed patient to go to nearest lab center [facility] in his case) and have STAT BMET drawn and faxed to [place].  Called nurse at lab center and gave her head's up.  Patient aware and agreeable.  Will await results to report to MD.  Ave Filter

## 2014-11-27 ENCOUNTER — Encounter (HOSPITAL_COMMUNITY): Payer: Self-pay | Admitting: Cardiology

## 2014-11-27 ENCOUNTER — Encounter: Payer: Self-pay | Admitting: Internal Medicine

## 2014-12-01 ENCOUNTER — Encounter (HOSPITAL_COMMUNITY): Payer: 59

## 2014-12-02 ENCOUNTER — Telehealth: Payer: Self-pay

## 2014-12-02 ENCOUNTER — Telehealth (HOSPITAL_COMMUNITY): Payer: Self-pay | Admitting: Vascular Surgery

## 2014-12-02 NOTE — Telephone Encounter (Signed)
All I did was sign/agree with what the other doctor had written. Tell him he may go back to work with the restrictions that the heart doctor wrote down.

## 2014-12-02 NOTE — Telephone Encounter (Signed)
Keep lifting to < 50 lbs.

## 2014-12-02 NOTE — Telephone Encounter (Signed)
Will send to Dr McLean for review 

## 2014-12-02 NOTE — Telephone Encounter (Signed)
Pt received a call from his job stating Dr. Shirlee Latch released him to go back to work.. Pt wants to know if there are any restrictions.. Please advise

## 2014-12-02 NOTE — Telephone Encounter (Signed)
Pt called stating he was unaware that he was released to go to work. He states his heart doctor approved this and is really confused about the restrictions. He would like to know what is going on with this and if Dr. [name] is ok wit him going back to work. Please call patient at [phone].

## 2014-12-03 ENCOUNTER — Encounter: Payer: Self-pay | Admitting: Anesthesiology

## 2014-12-03 NOTE — Telephone Encounter (Signed)
Spoke wit pt and wadvised message from Dr. [name]. Pt will call his doctor to discuss.

## 2014-12-08 ENCOUNTER — Telehealth (HOSPITAL_COMMUNITY): Payer: Self-pay | Admitting: Vascular Surgery

## 2014-12-08 NOTE — Telephone Encounter (Signed)
See phone note 12/21

## 2014-12-08 NOTE — Telephone Encounter (Signed)
Discussed w/Dr Shirlee Latch, he feels that pt could go back to work, however due to cardiac arrest pt can not drive for 6 months which will not be until April so if pt has no transportation he will have to stay out until then.  Spoke w/pt, he states he does not have any transportation and to catch the bus he would have to walk about 3 miles to the bus stop, he has discussed this with his employer and they have told him if he can not drive he should remain out on short term disability, form for [employer] stating pt is out of work until April has been completed and signed by Dr Shirlee Latch and faxed to them along with ov notes at [phone]

## 2014-12-08 NOTE — Telephone Encounter (Signed)
Pt said he stands 8 to 12 hours Mon- Friday, a lot of lifting 50 lbs or less. 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 Temp changes, hot cold and wet some times.. Pt want to know if he can drive.. Please advise

## 2014-12-15 ENCOUNTER — Other Ambulatory Visit: Payer: Self-pay

## 2014-12-15 ENCOUNTER — Telehealth: Payer: Self-pay | Admitting: Family Medicine

## 2014-12-15 NOTE — Telephone Encounter (Signed)
Pls notify pt that I did not RF his lisinopril b/c as of cardiology's last notes from early this month, he is no longer supposed to be taking this med.  Also, I don't recall him having any problems with pain so I did not RF his tramadol.  Pls RF the insulin if you have not done so already.-thx

## 2014-12-15 NOTE — Telephone Encounter (Signed)
Pt called wanting a refill on Lisinopril, Lantus pens, and Tramadol. Please advise.

## 2014-12-16 NOTE — Telephone Encounter (Signed)
Poke with pt, advised message from Dr. [name]. Pt understood.

## 2014-12-16 NOTE — Telephone Encounter (Signed)
Spoke with pt, advised message from Dr Mcgowen. Pt understood. 

## 2014-12-18 ENCOUNTER — Ambulatory Visit (HOSPITAL_BASED_OUTPATIENT_CLINIC_OR_DEPARTMENT_OTHER)
Admission: RE | Admit: 2014-12-18 | Discharge: 2014-12-18 | Disposition: A | Discharge status: Home / Self Care | Payer: 59 | Source: Ambulatory Visit | Attending: Family Medicine | Admitting: Family Medicine

## 2014-12-18 ENCOUNTER — Ambulatory Visit (INDEPENDENT_AMBULATORY_CARE_PROVIDER_SITE_OTHER): Payer: 59 | Admitting: Family Medicine

## 2014-12-18 ENCOUNTER — Encounter: Payer: Self-pay | Admitting: Family Medicine

## 2014-12-18 VITALS — BP 151/88 | HR 72 | Temp 97.8°F | Resp 18 | Ht 70.0 in | Wt 220.0 lb

## 2014-12-18 DIAGNOSIS — N2889 Other specified disorders of kidney and ureter: Secondary | ICD-10-CM

## 2014-12-18 DIAGNOSIS — S46012D Strain of muscle(s) and tendon(s) of the rotator cuff of left shoulder, subsequent encounter: Secondary | ICD-10-CM

## 2014-12-18 DIAGNOSIS — M25512 Pain in left shoulder: Secondary | ICD-10-CM | POA: Diagnosis present

## 2014-12-18 DIAGNOSIS — D61818 Other pancytopenia: Secondary | ICD-10-CM

## 2014-12-18 DIAGNOSIS — Z23 Encounter for immunization: Secondary | ICD-10-CM

## 2014-12-18 DIAGNOSIS — E875 Hyperkalemia: Secondary | ICD-10-CM

## 2014-12-18 DIAGNOSIS — N182 Chronic kidney disease, stage 2 (mild): Secondary | ICD-10-CM

## 2014-12-18 DIAGNOSIS — K7581 Nonalcoholic steatohepatitis (NASH): Secondary | ICD-10-CM

## 2014-12-18 LAB — COMPREHENSIVE METABOLIC PANEL
ALT: 27 U/L (ref 0–53)
AST: 32 U/L (ref 0–37)
Albumin: 3.8 g/dL (ref 3.5–5.2)
Alkaline Phosphatase: 165 U/L — ABNORMAL HIGH (ref 39–117)
BUN: 39 mg/dL — ABNORMAL HIGH (ref 6–23)
CHLORIDE: 107 meq/L (ref 96–112)
CO2: 26 mEq/L (ref 19–32)
Calcium: 9.2 mg/dL (ref 8.4–10.5)
Creat: 1.07 mg/dL (ref 0.50–1.35)
Glucose, Bld: 114 mg/dL — ABNORMAL HIGH (ref 70–99)
POTASSIUM: 6.1 meq/L — AB (ref 3.5–5.3)
SODIUM: 138 meq/L (ref 135–145)
TOTAL PROTEIN: 6.1 g/dL (ref 6.0–8.3)
Total Bilirubin: 0.7 mg/dL (ref 0.2–1.2)

## 2014-12-18 NOTE — Progress Notes (Signed)
OFFICE NOTE  12/18/2014  CC:  Chief Complaint  Patient presents with  . Follow-up   HPI: Patient is a 53 y.o. Caucasian male who is here for 1 mo f/u NASH, cardiomyopathy, DM 2. He walks his neighborhood during the day, walks the mall, says he feels great doing this now but only does this for 30 min at any given time.  Does no lifting or strenuous work such as even going up/down stairs.  He was never able to fit CHF clinic/rehab into his schedule--transportation issues were the main problem.  Cardiology has released him to return to work but he is not released to drive until 6 mo from the date of his cardiac arrest. No abd pain or digestion problems.  Stools are brown, formed.  No melena or hematochezia.  Says glucoses have been fine, has f/u with Dr. Elvera Lennox again early Jan 2016.  Pt has had pain in left shoulder since falling on it during his cardiac arrest about 2 mo ago, hurts esp with raising shoulder up/reaching for things and when he sleeps on it.  He has hx of left shoulder traumatic strain and very small supraspinatous tear in 2013 and went through some successful rehab.  No neck pain.  No radiation of pain or numbness/tingling down arm.  Pertinent PMH:  Past medical, surgical, social, and family history reviewed and no changes are noted since last office visit.  MEDS: Currently holding aldactone and lisinopril listed below.  He is not on ultram listed below Outpatient Prescriptions Prior to Visit  Medication Sig Dispense Refill  . aspirin 81 MG chewable tablet Chew 1 tablet (81 mg total) by mouth daily.    [name] atorvastatin (LIPITOR) 20 MG tablet Take 1 tablet (20 mg total) by mouth daily at 6 PM. 30 tablet 3  . carvedilol (COREG) 6.25 MG tablet Take 1 tablet (6.25 mg total) by mouth 2 (two) times daily with a meal. 60 tablet 3  . clopidogrel (PLAVIX) 75 MG tablet Take 1 tablet (75 mg total) by mouth daily. 30 tablet 3  . insulin aspart (NOVOLOG) 100 UNIT/ML injection Inject 0-10  Units into the skin 3 (three) times daily before meals. Sliding scale    . insulin glargine (LANTUS) 100 UNIT/ML injection Inject 0.22 mLs (22 Units total) into the skin daily. Patient advised to take insulin in the morning after working nights;  If working first or second shifts, takes insulin at bedtime 10 mL 11  . lidocaine (LIDODERM) 5 % Place 1 patch onto the skin daily. Remove & Discard patch within 12 hours or as directed by MD 30 patch 0  . ranitidine (ZANTAC) 150 MG tablet Take 150 mg by mouth daily as needed for heartburn.    [name] acetaminophen (TYLENOL) 500 MG tablet Take 500 mg by mouth every 6 (six) hours as needed for moderate pain.    . furosemide (LASIX) 40 MG tablet Take 1 tablet (40 mg total) by mouth daily as needed for fluid or edema. For weight greater than 225 (Patient not taking: Reported on 12/18/2014) 30 tablet 6  . glucagon 1 MG injection Inject 1 mg into the vein once as needed. For hypoglycemia (Patient not taking: Reported on 12/18/2014) 1 each 2  . glucose blood (FREESTYLE TEST STRIPS) test strip Use as instructed 100 each 6  . lisinopril (PRINIVIL,ZESTRIL) 5 MG tablet Take 1 tablet (5 mg total) by mouth daily. (Patient not taking: Reported on 12/18/2014) 30 tablet 3  . nitroGLYCERIN (NITROSTAT) 0.4 MG SL  tablet Place 1 tablet (0.4 mg total) under the tongue every 5 (five) minutes as needed for chest pain. 25 tablet 0  . spironolactone (ALDACTONE) 25 MG tablet Take 0.5 tablets (12.5 mg total) by mouth daily. (Patient not taking: Reported on 12/18/2014)    . traMADol (ULTRAM) 50 MG tablet Take 1 tablet (50 mg total) by mouth every 6 (six) hours as needed for moderate pain. (Patient not taking: Reported on 12/18/2014) 30 tablet 0   No facility-administered medications prior to visit.    PE: Blood pressure 151/88, pulse 72, temperature 97.8 F (36.6 C), temperature source Temporal, resp. rate 18, height 5\' 10"  (1.778 m), weight 220 lb (99.791 kg), SpO2 100 %. Gen: Alert,  well appearing.  Patient is oriented to person, place, time, and situation. No jaundice or pallor. CV: RRR, no m/r/g.   LUNGS: CTA bilat, nonlabored resps, good aeration in all lung fields. ABD: soft, NT/ND EXT: no clubbing, cyanosis, or edema.  Left shoulder: diffusely tender along bony shoulder girdle, particularly the AC joint and under acromion hook.  Pain with ER and aBduction.  +Impingement signs.  Neg drop sign.  UE strength 5/5 bilat.    LABS:  Lab Results  Component Value Date   WBC 4.3 11/20/2014   HGB 10.9* 11/20/2014   HCT 33.2* 11/20/2014   MCV 89.7 11/20/2014   PLT 45* 11/20/2014     Chemistry      Component Value Date/Time   NA 139 11/20/2014 1112   K 6.2* 11/20/2014 1112   CL 106 11/20/2014 1112   CO2 21 11/20/2014 1112   BUN 40* 11/20/2014 1112   CREATININE 1.16 11/20/2014 1112   CREATININE 1.20 11/11/2014 1452      Component Value Date/Time   CALCIUM 9.4 11/20/2014 1112   ALKPHOS 242* 10/24/2014 1100   AST 28 10/24/2014 1100   ALT 16 10/24/2014 1100   BILITOT 1.0 10/24/2014 1100     Lab Results  Component Value Date   CHOL 106 10/10/2014   HDL 20* 10/10/2014   LDLCALC 67 10/10/2014   TRIG 96 10/10/2014   CHOLHDL 5.3 10/10/2014   Lab Results  Component Value Date   TSH 1.900 07/19/2014   IMPRESSION AND PLAN:  1) Ischemic cardiomyopathy/CAD: stable/recovering well but he is not really stressing himself any. He reports he has a stress test coming up with cardiology and another visit with EP MD planned. Continue Coreg, dual platelet inhibition, statin.  Holding off on ACE-I and aldactone at this time due to recent problems with hyperkalemia. Recheck lytes/cr today.  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 Check CBC today and liver panel today. Hep B #2 today.  3) CRI, stage II.  His acute renal injury in hosp recently resolved.  He is not in scheduled  diuretics. Checking BUN/Cr today.  4) DM 2, good control lately.  Continue current meds and follow up with endocrinologist, Dr. Elvera LennoxGherghe.  5) Left shoulder pain: suspect he sustained another contusion when he fell during his cardiac arrest PLUS he likely re-injured his rotator cuff.  Also suspect he has some AC joint strain vs arthritis. Check X-ray of left shoulder today.  If not significant bony abnormality, he can return at his convenience for subacromial steroid injection.  An After Visit Summary was printed and given to the patient.  FOLLOW UP: 42mo

## 2014-12-18 NOTE — Addendum Note (Signed)
Addended by: Cydney Ok on: 12/18/2014 10:12 AM   Modules accepted: Orders

## 2014-12-18 NOTE — Progress Notes (Signed)
Pre visit review using our clinic review tool, if applicable. No additional management support is needed unless otherwise documented below in the visit note. 

## 2014-12-19 LAB — CBC WITH DIFFERENTIAL/PLATELET
BASOS ABS: 0.1 10*3/uL (ref 0.0–0.1)
Basophils Relative: 1 % (ref 0–1)
EOS ABS: 0.3 10*3/uL (ref 0.0–0.7)
Eosinophils Relative: 5 % (ref 0–5)
HCT: 31.2 % — ABNORMAL LOW (ref 39.0–52.0)
Hemoglobin: 10.6 g/dL — ABNORMAL LOW (ref 13.0–17.0)
LYMPHS ABS: 0.8 10*3/uL (ref 0.7–4.0)
LYMPHS PCT: 15 % (ref 12–46)
MCH: 30.2 pg (ref 26.0–34.0)
MCHC: 34 g/dL (ref 30.0–36.0)
MCV: 88.9 fL (ref 78.0–100.0)
MPV: 11.9 fL (ref 8.6–12.4)
Monocytes Absolute: 0.5 10*3/uL (ref 0.1–1.0)
Monocytes Relative: 9 % (ref 3–12)
Neutro Abs: 3.6 10*3/uL (ref 1.7–7.7)
Neutrophils Relative %: 70 % (ref 43–77)
PLATELETS: 38 10*3/uL — AB (ref 150–400)
RBC: 3.51 MIL/uL — ABNORMAL LOW (ref 4.22–5.81)
RDW: 14.3 % (ref 11.5–15.5)
WBC: 5.1 10*3/uL (ref 4.0–10.5)

## 2014-12-22 ENCOUNTER — Other Ambulatory Visit: Payer: Self-pay | Admitting: Family Medicine

## 2014-12-22 DIAGNOSIS — E875 Hyperkalemia: Secondary | ICD-10-CM

## 2014-12-24 ENCOUNTER — Other Ambulatory Visit: Payer: Self-pay | Admitting: *Deleted

## 2014-12-24 ENCOUNTER — Encounter: Payer: Self-pay | Admitting: Internal Medicine

## 2014-12-24 ENCOUNTER — Ambulatory Visit (INDEPENDENT_AMBULATORY_CARE_PROVIDER_SITE_OTHER): Payer: 59 | Admitting: Internal Medicine

## 2014-12-24 VITALS — BP 122/80 | HR 68 | Temp 97.9°F | Resp 12 | Wt 227.0 lb

## 2014-12-24 DIAGNOSIS — E1165 Type 2 diabetes mellitus with hyperglycemia: Secondary | ICD-10-CM

## 2014-12-24 DIAGNOSIS — IMO0002 Reserved for concepts with insufficient information to code with codable children: Secondary | ICD-10-CM

## 2014-12-24 DIAGNOSIS — E118 Type 2 diabetes mellitus with unspecified complications: Principal | ICD-10-CM

## 2014-12-24 LAB — GLUCOSE, POCT (MANUAL RESULT ENTRY): POC Glucose: 68 mg/dl — AB (ref 70–99)

## 2014-12-24 MED ORDER — INSULIN GLARGINE 100 UNIT/ML ~~LOC~~ SOLN: 18.0000 [IU] | Freq: Every day | SUBCUTANEOUS | Status: DC

## 2014-12-24 NOTE — Patient Instructions (Signed)
Please decrease Lantus to 18 units at bedtime. Please decrease NovoLog as follows: - 6 units with a smaller meal - 10 units with a larger meal Please change the NovoLog SSI  - target: 130 - insulin sensitivity factor: 30  If you start work, please continue the following schedule:  am lunch bedtime        Night Lantus          Night... Lantus          Night  Lantus      PM   Lantus     PM...   Lantus     PM   Lantus  AM   Lantus  AM...   Lantus  AM  Lantus        Night Lantus         Night... Lantus         Night  Lantus    Please return in 1.5 months with your sugar log.

## 2014-12-24 NOTE — Progress Notes (Signed)
Patient ID: [name], male   DOB: 02-Dec-1961, 54 y.o.   MRN: [id number]  HPI: [name] is a 54 y.o.-year-old male, returning for f/u for DM2, dx 2012, insulin-dependent since dx, uncontrolled, with complications (CRI stage 2, CAD, ischemic cardiomyopathy, PN, DR, L 4 and 5th toes amputated 2/2 osteomyelitis - 09/2012). Last visit 3 mo ago. Pt is here with his wife who offers part of the hx.  Since last visit, he had cardio-respiratory arrest 10/02/2014 (hypotension, hyperkalemia) >> resuscitated.  He had stents placed for CAD.  He had HD.   Last hemoglobin A1c was: Lab Results  Component Value Date   HGBA1C 5.8* 10/25/2014   HGBA1C 5.8* 10/10/2014   HGBA1C 7.2* 07/19/2014   Pt is on a regimen of: - Lantus 38 units qhs >> 22 units qhs - Novolog 10 units tid ac  - NovoLog SSI - target 130, ISF 10 Previously on Victoza.  At last visit, we continued same doses of insulin but switched the Lantus to am when works nights - in the following way:   am lunch bedtime        Night Lantus          Night... Lantus          Night  Lantus      PM   Lantus     PM...   Lantus     PM   Lantus  AM   Lantus  AM...   Lantus  AM  Lantus        Night Lantus         Night... Lantus         Night  Lantus    He works 2 weeks shifts: - nights 11 pm - 7:30 am - pm: 3 pm - 11:30 pm - am: 7 am - 3:30 pm  Pt checks his sugars 3x a day and they are great after his discharge - highest 137: - am: 86-126 >> 90-100 - 2h after b'fast: n/c - before lunch: 68 (when works nights)-136 >> 120s - 2h after lunch: n/c - before dinner: 126-137 >> 120s - 2h after dinner: n/c - bedtime: 120-130s >> 100-110 - nighttime: n/c Lowest sugar was 80s; he has hypoglycemia awareness at 80.   Pt's meals are: - Breakfast: yoghurt + fruit; PB + apple; eggs + toast - Lunch: leftovers or sandwich - Dinner: meat + starch (sometimes) + vegetables - Snacks: PB crackers, no soft drinks  - Has CKD, last  BUN/creatinine:  Lab Results  Component Value Date   BUN 39* 12/18/2014   CREATININE 1.07 12/18/2014  On Lisinopril - last set of lipids: Lab Results  Component Value Date   CHOL 106 10/10/2014   HDL 20* 10/10/2014   LDLCALC 67 10/10/2014   TRIG 96 10/10/2014   CHOLHDL 5.3 10/10/2014   - last eye exam was in 08/28/2014. + DR. He had surgery in 08/2014. - + numbness and tingling in his feet.  Pt has a h/o pancreatitis (gall bladder stones), cirrhosis (NASH).  ROS: Constitutional: no weight gain/loss, no fatigue, no subjective hypo-/hyperthermia Eyes: no blurry vision, no xerophthalmia ENT: no sore throat, no nodules palpated in throat, no dysphagia/odynophagia, no hoarseness Cardiovascular: no CP/SOB/no palpitations/leg swelling Respiratory: no cough/SOB Gastrointestinal: no N/V/D/C Musculoskeletal: no muscle/+ joint aches Skin: no rashes Neurological: no tremors/numbness/tingling/dizziness  I reviewed pt's medications, allergies, PMH, social hx, family hx, and changes were documented in the history of present illness. Otherwise, unchanged from  my initial visit note: Past Medical History  Diagnosis Date  . Heart murmur     ECHO 07/2011 showed mild AS and LVH.  TEE 09/2011 showed small PFO, bicuspid aortic valve but no stenosis or regurg.  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  Gen surg and GI recommended flagyl and cipro x 2 wks..  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  . Cholelithiasis     u/s--no cholecystitis.  06/2014 u/s showed no stones, only GB sludge  . Peripheral neuropathy     Diabetic:  Autonomic (pelvic) and LE polyneuropathy--[facility] testing showed that it is likely from DM  . Diabetic foot ulcers     Poor healing; normal ABIs and TBIs  . Arthritis     back L3 - L5  . Hyperactive gag reflex   .  History of pyelonephritis   . Type II or unspecified type diabetes mellitus with neurological manifestations, not stated as uncontrolled     IDDM: neuro, renal, and ophth complications  . Wears dentures     upper  . Osteomyelitis of toe of left foot     left 4th and 5th toes--now s/p amputation of these areas  . Diabetic retinopathy associated with type 2 diabetes mellitus     Laser tx in both eyes (Dr. Allyne Gee)  . Diabetic nephropathy     Proteinuria and CrCl 55-65  . Chronic renal insufficiency, stage III (moderate)     CrCl 50s-80s 2014 to pre-VFIB arrest 10/02/14, at which time he sustained AKI.  . Lumbar spondylosis     MRI 08/25/11 with multilevel facet dz, small disc protrusion at L5-S1 to the right without impingement  . Ischemic cardiomyopathy 07/22/14    Myoview w/out ischemia but large scar (inferior/apex) w/ global hypokinesis--medical mgmt  . History of pancreatitis 2014 and 2015    Suspected biliary: passing small gallstones: surgery declined to remove GB 07/2014.  . Aspiration pneumonia   . Thrombocytopenia   . Portal hypertension   . Liver cirrhosis secondary to NASH     ?+ alcohol?.  With hx of splenomegaly and ascities (dx approx 2012)  . History of gram negative sepsis 2015    e coli  . Ventricular fibrillation 10/02/14    Resuscitated, hospitalized, 3 V CAD detected, DES stent to mid LAD and to RCA were placed.   Past Surgical History  Procedure Laterality Date  . Knee arthroscopy  2001    Bilateral  . Amputation  10/24/2012    Procedure: AMPUTATION RAY;  Surgeon: Sherri Rad, MD;  Location: [facility];  Service: Orthopedics;  Laterality: Left;  left 4th toe amputation through MTP joint, 5th Ray amputation   . Knee surgery  2001  . Hida scan  06/2014    Normal  . Transthoracic echocardiogram  07/19/14    Mild LV dilation and hypertrophy, EF 35-40%, no wall motion abnormalities, grade II diast dysfxn, bicuspid aortic valve with mild AS and mild AI.   [name] Cardiovascular stress test  07/2014    Myoview w/out ischemia but large scar (inferior/apex) w/ global hypokinesis--medical mgmt  . Eye surgery  08/2014    Retinal hemorrhage evacuation  . Coronary angioplasty with stent placement  09/2014    DES to mid LAD and RCA; EF 40% by cardiac MRI  . Cardiac mri  09/2014  Moderately dilated LV.  EF 40%, multiple wall motion abnormalities, normal RV fxn.  Some areas of scarring.  . Left heart catheterization with coronary angiogram N/A 10/10/2014    Procedure: LEFT HEART CATHETERIZATION WITH CORONARY ANGIOGRAM;  Surgeon: Peter M Swaziland, MD;  Location: [facility] CATH LAB;  Service: Cardiovascular;  Laterality: N/A;  . Left heart catheterization with coronary angiogram N/A 10/15/2014    Procedure: LEFT HEART CATHETERIZATION WITH CORONARY ANGIOGRAM;  Surgeon: Iran Ouch, MD;  Location: [facility] CATH LAB;  Service: Cardiovascular;  Laterality: N/A;   History   Social History  . Marital Status: Single    Spouse Name: N/A    Number of Children: 0   Occupational History  . worker   Social History Main Topics  . Smoking status: Never Smoker   . Smokeless tobacco: Current User    Types: Chew  . Alcohol Use: No     Comment: Quit drinking alcohol 08/20/2011  . Drug Use: No   Social History Narrative   Single, no children.  Lives in [place], relocated from [place], [place] in 2008.   Chews tobacco but does not smoke.   Drinks 12 Beers daily---patient did not initially report/admit this--as of 2013 he has quit this.   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   Current Outpatient Prescriptions on File Prior to Visit  Medication Sig Dispense Refill  . aspirin 81 MG chewable tablet Chew 1 tablet (81 mg total) by mouth daily.    [name] atorvastatin (LIPITOR) 20 MG tablet Take 1 tablet (20 mg total) by mouth daily at 6 PM. 30 tablet 3  . carvedilol (COREG) 6.25 MG tablet Take 1 tablet (6.25 mg total) by mouth 2 (two) times daily with  a meal. 60 tablet 3  . clopidogrel (PLAVIX) 75 MG tablet Take 1 tablet (75 mg total) by mouth daily. 30 tablet 3  . glucagon 1 MG injection Inject 1 mg into the vein once as needed. For hypoglycemia 1 each 2  . glucose blood (FREESTYLE TEST STRIPS) test strip Use as instructed 100 each 6  . insulin aspart (NOVOLOG) 100 UNIT/ML injection Inject 0-10 Units into the skin 3 (three) times daily before meals. Sliding scale    . insulin glargine (LANTUS) 100 UNIT/ML injection Inject 0.22 mLs (22 Units total) into the skin daily. Patient advised to take insulin in the morning after working nights;  If working first or second shifts, takes insulin at bedtime 10 mL 11  . lidocaine (LIDODERM) 5 % Place 1 patch onto the skin daily. Remove & Discard patch within 12 hours or as directed by MD 30 patch 0  . nitroGLYCERIN (NITROSTAT) 0.4 MG SL tablet Place 1 tablet (0.4 mg total) under the tongue every 5 (five) minutes as needed for chest pain. 25 tablet 0  . ranitidine (ZANTAC) 150 MG tablet Take 150 mg by mouth daily as needed for heartburn.    [name] acetaminophen (TYLENOL) 500 MG tablet Take 500 mg by mouth every 6 (six) hours as needed for moderate pain.    . furosemide (LASIX) 40 MG tablet Take 1 tablet (40 mg total) by mouth daily as needed for fluid or edema. For weight greater than 225 (Patient not taking: Reported on 12/24/2014) 30 tablet 6  . lisinopril (PRINIVIL,ZESTRIL) 5 MG tablet Take 1 tablet (5 mg total) by mouth daily. (Patient not taking: Reported on 12/24/2014) 30 tablet 3  . spironolactone (ALDACTONE) 25 MG tablet Take 0.5 tablets (12.5  mg total) by mouth daily. (Patient not taking: Reported on 12/24/2014)    . traMADol (ULTRAM) 50 MG tablet Take 1 tablet (50 mg total) by mouth every 6 (six) hours as needed for moderate pain. (Patient not taking: Reported on 12/24/2014) 30 tablet 0   No current facility-administered medications on file prior to visit.   Allergies  Allergen Reactions  . Iodine Other (See  Comments)    Family history of anaphylaxis, unsure if patient has reaction  . Other Shortness Of Breath, Itching and Other (See Comments)    PERFUMES - SNEEZING  . Hydralazine Hcl Hives  . Naproxen Nausea And Vomiting   Family History  Problem Relation Age of Onset  . Heart disease Father   . Diabetes type II Sister    PE: BP 122/80 mmHg  Pulse 68  Temp(Src) 97.9 F (36.6 C) (Oral)  Resp 12  Wt 227 lb (102.967 kg)  SpO2 98% Wt Readings from Last 3 Encounters:  12/24/14 227 lb (102.967 kg)  12/18/14 220 lb (99.791 kg)  11/20/14 218 lb (98.884 kg)   Constitutional: overweight, in NAD Eyes: PERRLA, EOMI, no exophthalmos ENT: moist mucous membranes, no thyromegaly, no cervical lymphadenopathy Cardiovascular: RRR, No MRG Respiratory: CTA B Gastrointestinal: abdomen soft, NT, ND, BS+ Musculoskeletal: no deformities, strength intact in all 4 Skin: moist, warm, no rashes Neurological: no tremor with outstretched hands, DTR normal in all 4  ASSESSMENT: 1. DM2, insulin-dependent, controlled, with complications - CRI stage 2 - Cardiomyopathy - PN - DR - L 4 and 5th toes amputated 2/2 osteomyelitis - 09/2012  PLAN:  1. Patient with long-standing, controlled diabetes, on basal-bolus regimen, now with low CBGs despite reducing his insulin (after his stent placement). Will reduce both insulins. Sugar in the office: 68 today.  Patient Instructions  Please decrease Lantus to 18 units at bedtime. Please decrease NovoLog as follows: - 6 units with a smaller meal - 10 units with a larger meal Please change the NovoLog SSI  - target: 130 - insulin sensitivity factor: 30  If you start work, please continue the following schedule:  am lunch bedtime        Night Lantus          Night... Lantus          Night  Lantus      PM   Lantus     PM...   Lantus     PM   Lantus  AM   Lantus  AM...   Lantus  AM  Lantus        Night Lantus         Night... Lantus         Night  Lantus     Please return in 1.5 months with your sugar log.   - continue checking sugars at different times of the day - check 2 times a day, rotating checks - advised for yearly eye exams >> he is up to date - had the flu vaccine through work this season - Return to clinic in 1.5 mo with sugar log   - time spent with the patient: 25 min, of which >50% was spent in reviewing his sugars, discussing his hypo- and hyper-glycemic episodes, reviewing  previous labs and insulin doses and developing a plan to avoid hypo- and hyper-glycemia.

## 2014-12-31 ENCOUNTER — Other Ambulatory Visit (HOSPITAL_COMMUNITY): Payer: Self-pay | Admitting: Cardiology

## 2014-12-31 DIAGNOSIS — I5022 Chronic systolic (congestive) heart failure: Secondary | ICD-10-CM

## 2015-01-07 ENCOUNTER — Ambulatory Visit (HOSPITAL_BASED_OUTPATIENT_CLINIC_OR_DEPARTMENT_OTHER)
Admission: RE | Admit: 2015-01-07 | Discharge: 2015-01-07 | Disposition: A | Discharge status: Home / Self Care | Payer: 59 | Source: Ambulatory Visit | Attending: Internal Medicine | Admitting: Internal Medicine

## 2015-01-07 ENCOUNTER — Ambulatory Visit (HOSPITAL_COMMUNITY)
Admission: RE | Admit: 2015-01-07 | Discharge: 2015-01-07 | Disposition: A | Discharge status: Home / Self Care | Payer: 59 | Source: Ambulatory Visit | Attending: Family Medicine | Admitting: Family Medicine

## 2015-01-07 VITALS — BP 148/78 | HR 64 | Wt 226.0 lb

## 2015-01-07 DIAGNOSIS — E118 Type 2 diabetes mellitus with unspecified complications: Secondary | ICD-10-CM

## 2015-01-07 DIAGNOSIS — I5022 Chronic systolic (congestive) heart failure: Secondary | ICD-10-CM | POA: Insufficient documentation

## 2015-01-07 DIAGNOSIS — K7581 Nonalcoholic steatohepatitis (NASH): Secondary | ICD-10-CM

## 2015-01-07 DIAGNOSIS — K7460 Unspecified cirrhosis of liver: Secondary | ICD-10-CM

## 2015-01-07 DIAGNOSIS — E875 Hyperkalemia: Secondary | ICD-10-CM

## 2015-01-07 DIAGNOSIS — E1165 Type 2 diabetes mellitus with hyperglycemia: Secondary | ICD-10-CM

## 2015-01-07 DIAGNOSIS — I359 Nonrheumatic aortic valve disorder, unspecified: Secondary | ICD-10-CM

## 2015-01-07 DIAGNOSIS — I255 Ischemic cardiomyopathy: Secondary | ICD-10-CM

## 2015-01-07 DIAGNOSIS — IMO0002 Reserved for concepts with insufficient information to code with codable children: Secondary | ICD-10-CM

## 2015-01-07 LAB — BASIC METABOLIC PANEL
Anion gap: 6 (ref 5–15)
BUN: 33 mg/dL — ABNORMAL HIGH (ref 6–23)
CO2: 23 mmol/L (ref 19–32)
Calcium: 8.8 mg/dL (ref 8.4–10.5)
Chloride: 108 mEq/L (ref 96–112)
Creatinine, Ser: 1.12 mg/dL (ref 0.50–1.35)
GFR calc Af Amer: 86 mL/min — ABNORMAL LOW (ref >90)
GFR calc non Af Amer: 74 mL/min — ABNORMAL LOW (ref >90)
GLUCOSE: 132 mg/dL — AB (ref 70–99)
Potassium: 5.2 mmol/L — ABNORMAL HIGH (ref 3.5–5.1)
Sodium: 137 mmol/L (ref 135–145)

## 2015-01-07 MED ORDER — HYDRALAZINE HCL 25 MG PO TABS: 12.5000 mg | ORAL_TABLET | Freq: Three times a day (TID) | ORAL | Status: DC

## 2015-01-07 MED ORDER — ISOSORBIDE MONONITRATE ER 30 MG PO TB24: 30.0000 mg | ORAL_TABLET | Freq: Every day | ORAL | Status: DC

## 2015-01-07 NOTE — Progress Notes (Signed)
  Echocardiogram 2D Echocardiogram has been performed.  Delcie Roch 01/07/2015, 2:53 PM

## 2015-01-07 NOTE — Progress Notes (Signed)
Patient ID: [name], male   DOB: [birth year], 54 y.o.   MRN: [id number] PCP: Dr. [name]  54 yo with history of CAD s/p recent PCI, cirrhosis with thrombocytopenia, recent cardiac arrest, and ischemic cardiomyopathy presents for cardiology followup.  Patient was admitted in 7/15-8/15 with gallstone pancreatitis.  It was decided not to do a cholecystectomy due to high surgical risk.  He was sent home and went back to work.  In 10/15, he had a cardiac arrest at work and was defibrillated by AED.  He was cooled and recovered well.  Platelets were initially very low but recovered to his baseline in the 60K range.  Cardiac cath was done showing severe 2 vessel disease.  Cardiac MRI was done, showing EF 40% with a mixed viability picture.  With recovery of platelets, he had Xience DES to the LAD and the distal RCA.  He had acute on chronic systolic CHF with volume overload and was diuresed and eventually discharge.  Unfortunately, he was sent home on daily metolazone and developed AKI.  Diuretics were held and this has gradually improved.  Spironolactone and lisinopril were stopped due to hyperkalemia, but apparently he has still been taking spironolactone.     He returns for follow up. Since last visit spiro and ace have been stopped due to hyperkalemia. SBP at home 120-130s. Denies SOB/PND/Orthopnea/CP.  Having fatigue when walking around. Limited activity . Taking all medications. Not currently working. Prior cardiac arrest he was working full time at [employer].    Labs (11/15): K 4.7 => 7.2, creatinine 1.37 => 2.1 => 2.25 with BUN 94, HCT 26.9, plts 67, P2Y12 241 Labs (11/20/14): K 6.2, creatinine 1.1, HCT 33.2, plts 45 Labs  12/18/14: K 6.1 Creatinine 1.07 off spiro and lisinopril.   PMH: 1. 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  2. Thrombocytopenia: Chronic.  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   3. Gallstone pancreatitis: He did not have cholecystectomy as deemed too  high a surgical risk.  4. CKD 5. Type II diabetes 6. Bicuspid aortic valve: With mild AS and mild AI on last echo in 8/15.  7. H/o perforated diverticulum.  8. CAD: Cardiac arrest 10/15.  LHC (10/15) with 80-90% proximal ramus stenosis, 90% mLAD, subtotaled distal LAD, 90% D1, 90% dRCA/proximal PDA.  Patient had Xience DES to mLAD, dRCA.  9. Cardiac arrest: 10/15, defibrillated with AED and cooled.  10. Ischemic cardiomyopathy: Echo (8/15) with EF 35-40%, bicuspid aortic valve with mild AS/AI.  Cardiac MRI (10/15) with EF 40%, regional wall motion abnormalities, normal RV, delayed enhancement pattern suggesting mixed picture of viability.   SH: Never smoked.  Prior ETOH abuse, now stopped.  Works at [employer] and [employer].  Lives with sister in [place].   FH: No premature CAD.   ROS: All systems reviewed and negative except as per HPI.   Current Outpatient Prescriptions  Medication Sig Dispense Refill  . acetaminophen (TYLENOL) 500 MG tablet Take 500 mg by mouth every 6 (six) hours as needed for moderate pain.    [name] aspirin 81 MG chewable tablet Chew 1 tablet (81 mg total) by mouth daily.    [name] atorvastatin (LIPITOR) 20 MG tablet Take 1 tablet (20 mg total) by mouth daily at 6 PM. 30 tablet 3  . carvedilol (COREG) 6.25 MG tablet Take 1 tablet (6.25 mg total) by mouth 2 (two) times daily with a meal. 60 tablet 3  . clopidogrel (PLAVIX) 75 MG tablet Take 1 tablet (75  mg total) by mouth daily. 30 tablet 3  . glucose blood (FREESTYLE TEST STRIPS) test strip Use as instructed 100 each 6  . insulin aspart (NOVOLOG) 100 UNIT/ML injection Inject 0-10 Units into the skin 3 (three) times daily before meals. Sliding scale    . insulin glargine (LANTUS) 100 UNIT/ML injection Inject 0.18 mLs (18 Units total) into the skin daily. Patient advised to take insulin in the morning after working nights;  If working first or second shifts, takes insulin at bedtime 10 mL 11  . lidocaine (LIDODERM) 5 % Place 1 patch  onto the skin daily. Remove & Discard patch within 12 hours or as directed by MD 30 patch 0  . nitroGLYCERIN (NITROSTAT) 0.4 MG SL tablet Place 1 tablet (0.4 mg total) under the tongue every 5 (five) minutes as needed for chest pain. 25 tablet 0  . ranitidine (ZANTAC) 150 MG tablet Take 150 mg by mouth daily as needed for heartburn.     No current facility-administered medications for this encounter.   BP 148/78 mmHg  Pulse 64  Wt 226 lb (102.513 kg)  SpO2 100% General: NAD Neck: No JVD, no thyromegaly or thyroid nodule.  Lungs: Clear   CV: Nondisplaced PMI.  Heart regular S1/S2, no S3/S4, 2/6 early SEM RUSB.  No peripheral edema.  No carotid bruit.  Normal pedal pulses.  Abdomen: Soft, nontender, no hepatosplenomegaly, no distention.  Skin: Intact without lesions or rashes.  Neurologic: Alert and oriented x 3.  Psych: Normal affect. Extremities: No clubbing or cyanosis.  HEENT: Normal.   Assessment/Plan: 1. Chronic systolic CHF: EF 87/68/1157 40% by cardiac MRI, ischemic cardiomyopathy.  He is not volume overloaded on exam and has not been taking Lasix.  Continue lasix  40 mg daily only for weight of 225 or greater - Not on Ace or spiro due to hyperkalemia. Check BMET today -- Add hydralazine  12.5 mg tid and imdur 30 mg daily  - ECHO today EF 35-40% but poorly visualized--> ECHO reviewed by Dr Shirlee Latch. Plan to repeat ECHO in 3 months with contrast.  For now will keep out of work until he has repeat ECHO in 3 months and he completes cardiac rehab. Encouraged to walk at least 15 minutes a day. I suspect he very deconditioned.  2. CAD: Status post Xience DES to LAD and RCA after cardiac arrest.  Will need to follow closely due to bleeding risk on DAPT with chronic thrombocytopenia.   He is currently on both ASA 81 and Plavix as P2Y12 test has not shown robust platelet inhibition with Plavix (most recently 241 PRU).  No evidence for bleeding.  - Continue statin, transaminases have not been  elevated.  - Continue ASA 81 and Plavix for now.  P2Y12 was 241 will continue ASA 81 and Plavix 75 daily.  Initially unable to attend cardiac rehab but would like to try cardiac rehab severe deconditioning. Refer to cardiac rehab.  3. Cirrhosis: Likely related to ETOH.  Has associated splenomegaly and chronic thrombocytopenia.  4. Thrombocytopenia: Chronic, likely due to splenomegaly. Will need to follow plts/hemoglobin over time closely.  5. DM- Per PCP. Hgb A1C 5.8. On insulin 6. Hyperkalemia- off ace and spiro. Check BMET today    Follow up in 3 weeks.   CLEGG,AMY NP-C 01/07/2015

## 2015-01-07 NOTE — Patient Instructions (Signed)
START Hydralazine 1/2 tablet (12.5mg ) three times daily  START Imdur 1 tablet (30mg ) once daily.  We will refer you to Cardiac Rehab.  They will contact you by phone for your initial appointment.  Follow up 4 weeks.  Do the following things EVERYDAY: 1) Weigh yourself in the morning before breakfast. Write it down and keep it in a log. 2) Take your medicines as prescribed 3) Eat low salt foods-Limit salt (sodium) to 2000 mg per day.  4) Stay as active as you can everyday 5) Limit all fluids for the day to less than 2 liters

## 2015-01-13 ENCOUNTER — Encounter (HOSPITAL_COMMUNITY): Payer: 59

## 2015-01-13 ENCOUNTER — Other Ambulatory Visit (HOSPITAL_COMMUNITY): Payer: 59

## 2015-01-21 ENCOUNTER — Ambulatory Visit: Payer: 59 | Admitting: Family Medicine

## 2015-01-22 ENCOUNTER — Encounter (HOSPITAL_COMMUNITY)
Admission: RE | Admit: 2015-01-22 | Discharge: 2015-01-22 | Disposition: A | Discharge status: Home / Self Care | Payer: 59 | Source: Ambulatory Visit | Attending: Cardiology | Admitting: Cardiology

## 2015-01-22 DIAGNOSIS — Z79899 Other long term (current) drug therapy: Secondary | ICD-10-CM | POA: Insufficient documentation

## 2015-01-22 DIAGNOSIS — I252 Old myocardial infarction: Secondary | ICD-10-CM | POA: Insufficient documentation

## 2015-01-22 DIAGNOSIS — R040 Epistaxis: Secondary | ICD-10-CM | POA: Insufficient documentation

## 2015-01-22 DIAGNOSIS — I509 Heart failure, unspecified: Secondary | ICD-10-CM | POA: Insufficient documentation

## 2015-01-22 NOTE — Progress Notes (Signed)
Cardiac Rehab Medication Review by a Pharmacist  Does the patient  feel that his/her medications are working for him/her?  yes  Has the patient been experiencing any side effects to the medications prescribed?  no  Does the patient measure his/her own blood pressure or blood glucose at home?  yes - blood pressure and blood glucose  Does the patient have any problems obtaining medications due to transportation or finances?   no  Understanding of regimen: good Understanding of indications: fair Potential of compliance: good  Pharmacist comments: Patient has no barriers to obtaining medications.  He states he cannot drive until April, but his sister picks up his medications for him. He measures his blood pressure and blood glucose at home.  He is not having any side effects and feels his medications are working well for him.  Cassie L. Roseanne Reno, PharmD Clinical Pharmacy Resident Pager: [phone] 01/22/2015 8:30 AM

## 2015-01-26 ENCOUNTER — Other Ambulatory Visit (HOSPITAL_COMMUNITY): Payer: 59

## 2015-01-26 ENCOUNTER — Encounter (HOSPITAL_COMMUNITY): Payer: Self-pay

## 2015-01-26 ENCOUNTER — Encounter (HOSPITAL_COMMUNITY)
Admission: RE | Admit: 2015-01-26 | Discharge: 2015-01-26 | Disposition: A | Discharge status: Home / Self Care | Payer: 59 | Source: Ambulatory Visit | Attending: Cardiology | Admitting: Cardiology

## 2015-01-26 DIAGNOSIS — R040 Epistaxis: Secondary | ICD-10-CM | POA: Diagnosis not present

## 2015-01-26 DIAGNOSIS — I509 Heart failure, unspecified: Secondary | ICD-10-CM | POA: Diagnosis present

## 2015-01-26 DIAGNOSIS — I252 Old myocardial infarction: Secondary | ICD-10-CM | POA: Diagnosis not present

## 2015-01-26 DIAGNOSIS — Z79899 Other long term (current) drug therapy: Secondary | ICD-10-CM | POA: Diagnosis not present

## 2015-01-26 NOTE — Progress Notes (Signed)
Pt started cardiac rehab today.  Pt tolerated light exercise without difficulty.  VSS,telemetry-sinus rhythm, non specific ST-T wave changes. Asymptomatic.  Pt CBG-220 pre exercise down to 85 post exercise. Pt reports eating peanut butter toast and apple > 1 hour before exercise. Pt given lemonade and graham crackers. 15 minute recheck 135.  Pt instructed to add more protein to breakfast meal prior to exercise. Pt is currently using smokeless tobacco occasionally, dips 1 can every 2 weeks. Pt admits this is stress related.  Pt is interested in quitting this use. Pt counseling provided.  PHQ-1.  Pt reports being discouraged with disabling results of his cardiac event, inability to drive and work at this time.  Pt is eager to restore his health to return to these activities. Pt has transportation barrier to rehab participation.  Public transportation unavailable near pt home. Pt sister has limited ability to provide transportation due to her work schedule.  Pt is awaiting response from his church congregation for transportation assistance.   [facility] transportation services contacted on pt behalf, however only provides services for [facility] patients.  [facility] charges additional surcharge to transport outside [place] city limits to pt home in [place].     Pt enjoys working, baseball (former Armed forces technical officer) and being outdoors.  Pt is eager to return to work and considers this his primary cardiac rehabilitation goal to be able to return to work and previous activities.  Pt oriented to exercise equipment and routine.  Understanding verbalized.

## 2015-01-26 NOTE — Progress Notes (Signed)
[name] 54 y.o. male  Nutrition Note Spoke with pt. Pt interested in Nutrition Class information, but is unable to attend Tuesday classes. Pt requested information re: sodium and potassium. Hyperkalemia in hospital noted. Hyperkalemia improved. Pt educated re: potassium in foods. Per discussion, pt is going to review nutrition class materials and ask questions/set-up an appointment prn. Pt expressed understanding of the information reviewed.  BMET    Component Value Date/Time   NA 137 01/07/2015 1602   K 5.2* 01/07/2015 1602   CL 108 01/07/2015 1602   CO2 23 01/07/2015 1602   GLUCOSE 132* 01/07/2015 1602   BUN 33* 01/07/2015 1602   CREATININE 1.12 01/07/2015 1602   CREATININE 1.07 12/18/2014 1016   CALCIUM 8.8 01/07/2015 1602   GFRNONAA 74* 01/07/2015 1602   GFRAA 86* 01/07/2015 1602   Nutrition Diagnosis ? Food-and nutrition-related knowledge deficit related to lack of exposure to information as related to diagnosis of: ? CVD ? DM   Nutrition Intervention ? Pt's individual nutrition plan reviewed with pt. ? Pt given handouts for: ? Nutrition I class ? Nutrition II class ? Diabetes Blitz  ? Low Sodium Nutrition therapy ? Potassium Content of Foods ? Continue client-centered nutrition education by RD, as part of interdisciplinary care. Goal(s) ? Pt to identify and limit food sources of saturated fat, trans fat, sodium and potassium Monitor and Evaluate progress toward nutrition goal with team. Mickle Plumb, M.Ed, RD, LDN, CDE 01/26/2015 10:09 AM

## 2015-01-27 LAB — GLUCOSE, CAPILLARY
GLUCOSE-CAPILLARY: 88 mg/dL (ref 70–99)
Glucose-Capillary: 220 mg/dL — ABNORMAL HIGH (ref 70–99)
Glucose-Capillary: 135 mg/dL — ABNORMAL HIGH (ref 70–99)

## 2015-01-28 ENCOUNTER — Encounter (HOSPITAL_COMMUNITY)
Admission: RE | Admit: 2015-01-28 | Discharge: 2015-01-28 | Disposition: A | Discharge status: Home / Self Care | Payer: 59 | Source: Ambulatory Visit | Attending: Cardiology | Admitting: Cardiology

## 2015-01-28 DIAGNOSIS — I509 Heart failure, unspecified: Secondary | ICD-10-CM | POA: Diagnosis not present

## 2015-01-28 LAB — GLUCOSE, CAPILLARY
GLUCOSE-CAPILLARY: 117 mg/dL — AB (ref 70–99)
Glucose-Capillary: 109 mg/dL — ABNORMAL HIGH (ref 70–99)

## 2015-01-30 ENCOUNTER — Encounter (HOSPITAL_COMMUNITY)
Admission: RE | Admit: 2015-01-30 | Discharge: 2015-01-30 | Disposition: A | Discharge status: Home / Self Care | Payer: 59 | Source: Ambulatory Visit | Attending: Cardiology | Admitting: Cardiology

## 2015-01-30 DIAGNOSIS — I509 Heart failure, unspecified: Secondary | ICD-10-CM | POA: Diagnosis not present

## 2015-01-30 LAB — GLUCOSE, CAPILLARY: GLUCOSE-CAPILLARY: 129 mg/dL — AB (ref 70–99)

## 2015-02-02 ENCOUNTER — Encounter (HOSPITAL_COMMUNITY): Payer: 59

## 2015-02-02 LAB — GLUCOSE, CAPILLARY: GLUCOSE-CAPILLARY: 223 mg/dL — AB (ref 70–99)

## 2015-02-03 ENCOUNTER — Ambulatory Visit: Payer: 59 | Admitting: Internal Medicine

## 2015-02-04 ENCOUNTER — Encounter (HOSPITAL_COMMUNITY)
Admission: RE | Admit: 2015-02-04 | Discharge: 2015-02-04 | Disposition: A | Discharge status: Home / Self Care | Payer: 59 | Source: Ambulatory Visit | Attending: Cardiology | Admitting: Cardiology

## 2015-02-04 DIAGNOSIS — I509 Heart failure, unspecified: Secondary | ICD-10-CM | POA: Diagnosis not present

## 2015-02-04 LAB — GLUCOSE, CAPILLARY: Glucose-Capillary: 290 mg/dL — ABNORMAL HIGH (ref 70–99)

## 2015-02-06 ENCOUNTER — Encounter (HOSPITAL_COMMUNITY)
Admission: RE | Admit: 2015-02-06 | Discharge: 2015-02-06 | Disposition: A | Discharge status: Home / Self Care | Payer: 59 | Source: Ambulatory Visit | Attending: Cardiology | Admitting: Cardiology

## 2015-02-06 DIAGNOSIS — I509 Heart failure, unspecified: Secondary | ICD-10-CM | POA: Diagnosis not present

## 2015-02-06 LAB — GLUCOSE, CAPILLARY: Glucose-Capillary: 95 mg/dL (ref 70–99)

## 2015-02-09 ENCOUNTER — Encounter (HOSPITAL_COMMUNITY)
Admission: RE | Admit: 2015-02-09 | Discharge: 2015-02-09 | Disposition: A | Discharge status: Home / Self Care | Payer: 59 | Source: Ambulatory Visit | Attending: Cardiology | Admitting: Cardiology

## 2015-02-09 ENCOUNTER — Ambulatory Visit (HOSPITAL_COMMUNITY)
Admission: RE | Admit: 2015-02-09 | Discharge: 2015-02-09 | Disposition: A | Discharge status: Home / Self Care | Payer: 59 | Source: Ambulatory Visit | Attending: Cardiology | Admitting: Cardiology

## 2015-02-09 VITALS — BP 142/70 | HR 75 | Wt 228.0 lb

## 2015-02-09 DIAGNOSIS — Z7982 Long term (current) use of aspirin: Secondary | ICD-10-CM | POA: Insufficient documentation

## 2015-02-09 DIAGNOSIS — Z794 Long term (current) use of insulin: Secondary | ICD-10-CM | POA: Diagnosis not present

## 2015-02-09 DIAGNOSIS — D696 Thrombocytopenia, unspecified: Secondary | ICD-10-CM | POA: Diagnosis not present

## 2015-02-09 DIAGNOSIS — Z79899 Other long term (current) drug therapy: Secondary | ICD-10-CM | POA: Diagnosis not present

## 2015-02-09 DIAGNOSIS — I5022 Chronic systolic (congestive) heart failure: Secondary | ICD-10-CM | POA: Diagnosis not present

## 2015-02-09 DIAGNOSIS — I255 Ischemic cardiomyopathy: Secondary | ICD-10-CM | POA: Diagnosis not present

## 2015-02-09 DIAGNOSIS — N189 Chronic kidney disease, unspecified: Secondary | ICD-10-CM | POA: Insufficient documentation

## 2015-02-09 DIAGNOSIS — E119 Type 2 diabetes mellitus without complications: Secondary | ICD-10-CM | POA: Diagnosis not present

## 2015-02-09 DIAGNOSIS — Q231 Congenital insufficiency of aortic valve: Secondary | ICD-10-CM | POA: Insufficient documentation

## 2015-02-09 DIAGNOSIS — Z7902 Long term (current) use of antithrombotics/antiplatelets: Secondary | ICD-10-CM | POA: Insufficient documentation

## 2015-02-09 DIAGNOSIS — E875 Hyperkalemia: Secondary | ICD-10-CM | POA: Diagnosis not present

## 2015-02-09 DIAGNOSIS — K7460 Unspecified cirrhosis of liver: Secondary | ICD-10-CM | POA: Insufficient documentation

## 2015-02-09 DIAGNOSIS — I2510 Atherosclerotic heart disease of native coronary artery without angina pectoris: Secondary | ICD-10-CM | POA: Insufficient documentation

## 2015-02-09 DIAGNOSIS — I509 Heart failure, unspecified: Secondary | ICD-10-CM | POA: Diagnosis not present

## 2015-02-09 LAB — LIPID PANEL
CHOL/HDL RATIO: 3.2 ratio
Cholesterol: 132 mg/dL (ref 0–200)
HDL: 41 mg/dL (ref >39)
LDL CALC: 59 mg/dL (ref 0–99)
TRIGLYCERIDES: 160 mg/dL — AB (ref <150)
VLDL: 32 mg/dL (ref 0–40)

## 2015-02-09 LAB — CBC
HEMATOCRIT: 32.4 % — AB (ref 39.0–52.0)
Hemoglobin: 10.9 g/dL — ABNORMAL LOW (ref 13.0–17.0)
MCH: 29.8 pg (ref 26.0–34.0)
MCHC: 33.6 g/dL (ref 30.0–36.0)
MCV: 88.5 fL (ref 78.0–100.0)
Platelets: 47 10*3/uL — ABNORMAL LOW (ref 150–400)
RBC: 3.66 MIL/uL — ABNORMAL LOW (ref 4.22–5.81)
RDW: 12.8 % (ref 11.5–15.5)
WBC: 3.9 10*3/uL — AB (ref 4.0–10.5)

## 2015-02-09 LAB — BASIC METABOLIC PANEL
Anion gap: 3 — ABNORMAL LOW (ref 5–15)
BUN: 43 mg/dL — ABNORMAL HIGH (ref 6–23)
CO2: 24 mmol/L (ref 19–32)
Calcium: 8.5 mg/dL (ref 8.4–10.5)
Chloride: 110 mmol/L (ref 96–112)
Creatinine, Ser: 1.08 mg/dL (ref 0.50–1.35)
GFR calc Af Amer: 89 mL/min — ABNORMAL LOW (ref >90)
GFR calc non Af Amer: 77 mL/min — ABNORMAL LOW (ref >90)
Glucose, Bld: 152 mg/dL — ABNORMAL HIGH (ref 70–99)
Potassium: 4.8 mmol/L (ref 3.5–5.1)
SODIUM: 137 mmol/L (ref 135–145)

## 2015-02-09 MED ORDER — HYDRALAZINE HCL 25 MG PO TABS: 25.0000 mg | ORAL_TABLET | Freq: Three times a day (TID) | ORAL | Status: DC

## 2015-02-09 MED ORDER — CARVEDILOL 12.5 MG PO TABS: 12.5000 mg | ORAL_TABLET | Freq: Two times a day (BID) | ORAL | Status: DC

## 2015-02-09 NOTE — Progress Notes (Signed)
Patient ID: [name], male   DOB: 1960-12-20, 54 y.o.   MRN: [id number] PCP: Dr. [name]  54 yo with history of CAD s/p recent PCI, cirrhosis with thrombocytopenia, cardiac arrest, and ischemic cardiomyopathy presents for cardiology followup.  Patient was admitted in 7/15-8/15 with gallstone pancreatitis.  It was decided not to do a cholecystectomy due to high surgical risk.  He was sent home and went back to work.  In 10/15, he had a cardiac arrest at work and was defibrillated by AED.  He was cooled and recovered well.  Platelets were initially very low but recovered to his baseline in the 60K range.  Cardiac cath was done showing severe 2 vessel disease.  Cardiac MRI was done, showing EF 40% with a mixed viability picture.  With recovery of platelets, he had Xience DES to the LAD and the distal RCA.  He had acute on chronic systolic CHF with volume overload and was diuresed and eventually discharge.  Unfortunately, he was sent home on daily metolazone and developed AKI.  Diuretics were held and this has gradually improved.  Spironolactone and lisinopril have been stopped due to hyperkalemia.     He returns for follow up. Generally doing well.  No significant exertional dyspnea though not particularly active.  No chest pain.  No lightheadedness/syncope.  No palpitations. Weight is up 2 lbs.  He is going to cardiac rehab.  Echo was done in 1/16, showing EF 35-40%, stable.   Labs (11/15): K 4.7 => 7.2, creatinine 1.37 => 2.1 => 2.25 with BUN 94, HCT 26.9, plts 67, P2Y12 241 Labs (11/20/14): K 6.2, creatinine 1.1, HCT 33.2, plts 45 Labs (12/18/14): K 6.1 Creatinine 1.07 off spiro and lisinopril.  Labs (1/16): K 5.2, creatinine 1.12  PMH: 1. 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  2. Thrombocytopenia: Chronic.  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   3. Gallstone pancreatitis: He did not have cholecystectomy as deemed too high a surgical risk.  4. CKD 5. Type II  diabetes 6. Bicuspid aortic valve: With no AS and mild AI on echo in 1/16.   7. H/o perforated diverticulum.  8. CAD: Cardiac arrest 10/15.  LHC (10/15) with 80-90% proximal ramus stenosis, 90% mLAD, subtotaled distal LAD, 90% D1, 90% dRCA/proximal PDA.  Patient had Xience DES to mLAD, dRCA.  9. Cardiac arrest: 10/15, defibrillated with AED and cooled.  10. Ischemic cardiomyopathy: Echo (8/15) with EF 35-40%, bicuspid aortic valve with mild AS/AI.  Cardiac MRI (10/15) with EF 40%, regional wall motion abnormalities, normal RV, delayed enhancement pattern suggesting mixed picture of viability. Echo (1/16) with EF 35-40%, hypokinesis of the mid to apical septum and the true apex, bicuspid aortic valve, mild AI.   SH: Never smoked.  Prior ETOH abuse, now stopped.  Works at [employer] and [employer].  Lives with sister in [place].   FH: No premature CAD.   ROS: All systems reviewed and negative except as per HPI.   Current Outpatient Prescriptions  Medication Sig Dispense Refill  . acetaminophen (TYLENOL) 500 MG tablet Take 500 mg by mouth every 6 (six) hours as needed for moderate pain.    [name] aspirin 81 MG chewable tablet Chew 1 tablet (81 mg total) by mouth daily.    [name] atorvastatin (LIPITOR) 20 MG tablet Take 1 tablet (20 mg total) by mouth daily at 6 PM. 30 tablet 3  . carvedilol (COREG) 12.5 MG tablet Take 1 tablet (12.5 mg total) by mouth 2 (two) times  daily with a meal. 60 tablet 3  . clopidogrel (PLAVIX) 75 MG tablet Take 1 tablet (75 mg total) by mouth daily. 30 tablet 3  . glucose blood (FREESTYLE TEST STRIPS) test strip Use as instructed 100 each 6  . hydrALAZINE (APRESOLINE) 25 MG tablet Take 1 tablet (25 mg total) by mouth 3 (three) times daily. 90 tablet 3  . insulin aspart (NOVOLOG) 100 UNIT/ML injection Inject 0-10 Units into the skin 3 (three) times daily before meals. Sliding scale    . insulin glargine (LANTUS) 100 UNIT/ML injection Inject 0.18 mLs (18 Units total) into the skin  daily. Patient advised to take insulin in the morning after working nights;  If working first or second shifts, takes insulin at bedtime 10 mL 11  . isosorbide mononitrate (IMDUR) 30 MG 24 hr tablet Take 1 tablet (30 mg total) by mouth daily. 30 tablet 3  . lidocaine (LIDODERM) 5 % Place 1 patch onto the skin daily. Remove & Discard patch within 12 hours or as directed by MD 30 patch 0  . nitroGLYCERIN (NITROSTAT) 0.4 MG SL tablet Place 1 tablet (0.4 mg total) under the tongue every 5 (five) minutes as needed for chest pain. 25 tablet 0   No current facility-administered medications for this encounter.   BP 142/70 mmHg  Pulse 75  Wt 228 lb (103.42 kg)  SpO2 99% General: NAD Neck: No JVD, no thyromegaly or thyroid nodule.  Lungs: Clear   CV: Nondisplaced PMI.  Heart regular S1/S2, no S3/S4, 2/6 early SEM RUSB.  No peripheral edema.  No carotid bruit.  Normal pedal pulses.  Abdomen: Soft, nontender, no hepatosplenomegaly, no distention.  Skin: Intact without lesions or rashes.  Neurologic: Alert and oriented x 3.  Psych: Normal affect. Extremities: No clubbing or cyanosis.  HEENT: Normal.   Assessment/Plan: 1. Chronic systolic CHF: EF 16/09/9603 40% by cardiac MRI and 35-40% by 1/16 echo, ischemic cardiomyopathy.  He is not volume overloaded on exam and has not been taking Lasix.  NYHA class II symptoms. - Continue lasix  40 mg daily only for weight of 225 or greater - Not on ACEI or spironolactone due to hyperkalemia. Check BMET today - Increase hydralazine to 25 mg tid, continue Imdur.  - Increase Coreg to 12.5 mg bid.  - Will remain out of work until 4/16 when he is able to drive again.  Would continue cardiac rehab until then.  2. CAD: Status post Xience DES to LAD and RCA after cardiac arrest.  Will need to follow closely due to bleeding risk on DAPT with chronic thrombocytopenia.   He is currently on both ASA 81 and Plavix as P2Y12 test has not shown robust platelet inhibition with  Plavix (most recently 241 PRU).  No evidence for bleeding.  - Continue statin, check lipids today. - Continue ASA 81 and Plavix.  - Continue cardiac rehab.  - May drive 6 months post-arrest if no further events (4/16).  3. Cirrhosis: Likely related to ETOH.  Has associated splenomegaly and chronic thrombocytopenia.  4. Thrombocytopenia: Chronic, likely due to splenomegaly. Will need to follow plts/hemoglobin over time closely. CBC today.  5. DM: Per PCP.  6. Hyperkalemia: Off ACEI and spironolactone.  Check BMET today.   Followup in 2 months.   Marca Ancona 02/09/2015

## 2015-02-09 NOTE — Patient Instructions (Signed)
Increase Carvedilol to 12.5 mg Twice daily, you can take 2 of your 6.25 mg tablets until you run out then we have sent you in a new prescription for 12.5 mg tablets  Increase Hydralazine to 25 mg (1 tab) Three times a day   Labs today  Your physician recommends that you schedule a follow-up appointment in: 2 months

## 2015-02-11 ENCOUNTER — Encounter (HOSPITAL_COMMUNITY)
Admission: RE | Admit: 2015-02-11 | Discharge: 2015-02-11 | Disposition: A | Discharge status: Home / Self Care | Payer: 59 | Source: Ambulatory Visit | Attending: Cardiology | Admitting: Cardiology

## 2015-02-11 DIAGNOSIS — I509 Heart failure, unspecified: Secondary | ICD-10-CM | POA: Diagnosis not present

## 2015-02-11 NOTE — Progress Notes (Signed)
PSYCHOSOCIAL ASSESSMENT  Pt psychosocial assessment reveals no barriers to rehab participation.  Pt quality of life is slightly altered by his physical constraints which limits his ability to perform tasks as prior to his illness. Pt has been in declining health since 2012 but most debilitating was his recent cardiac event which has prohibited his driving.  This causes pt to feel isolated and homebound.  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  Pt also misses his ability to visit relatives in [place].    Pt exhibits positive coping skills and has supportive family, he lives with his sister who is his primary caregiver.  Pt has recently obtained transportation assistance from friends and neighbors which has decreased his burden.  Pt denies recent alcohol use.  Offered emotional support and reassurance.  Will continue to monitor.

## 2015-02-13 ENCOUNTER — Encounter (HOSPITAL_COMMUNITY)
Admission: RE | Admit: 2015-02-13 | Discharge: 2015-02-13 | Disposition: A | Discharge status: Home / Self Care | Payer: 59 | Source: Ambulatory Visit | Attending: Cardiology | Admitting: Cardiology

## 2015-02-13 NOTE — Progress Notes (Signed)
Patient presented to Cardiac rehab at approximately 7:45am notifying us he had a nose bleed. Patient reported that he blew his nose prior to coming into the building and said it was bloody and clot formation was present.  Upon assessment noted frank bleeding from right nostril only. Did not see any clot formation, the blood was bright red.Had patient apply pressure to right nostril with washcloth in hand. BP assessed and was 134/65.  He had no complaints of dizziness, no nausea, vision was normal and no shortness of breath. He said he felt fine other than concerns about the nose bleed. He reports Hydralazine and Carvedilol doses were increased on Monday. He reports prior nose bleed about 2 weeks ago as he was arriving to cardiac rehab. Had patient go to treatment room. Put in supine position with wedges elevating his torso/head at about 45 degree angle, supported the neck with towel and this writer applied pressure to the outside area of the right nostril. Pressure holding began at about 7:50am.  At about 8:30am bleeding had slowed but not stopped.  Applied ice-pack to nose area and maintained pressure to the right nostril. After 15 minutes alternated ice-pack and pressure every 5 minutes. Noted at 0900am the bleeding had slowed significantly. Continued with alternating ice-pack and pressure. At 0925am noted no further bleeding. Stopped pressure and applied ice-pack 5 minutes longer. BP was 144/66, HR 60 bpm.  At 0932 patient sat upright with pressure applied to right nostril for about 5 minutes. Released pressure. Continued to have no further bleeding. At 9:45am patient ambulated a few laps in gym. No bleeding. Instructions given to patient to follow-up with Physician regarding the nose bleeds. Also provided instructions to patient steps to follow if nose-bleed recurs.  Final blood pressure before patient left department was 143/68, HR 65. Patient had a ride arriving at 10am.

## 2015-02-16 ENCOUNTER — Encounter (HOSPITAL_COMMUNITY)
Admission: RE | Admit: 2015-02-16 | Discharge: 2015-02-16 | Disposition: A | Discharge status: Home / Self Care | Payer: 59 | Source: Ambulatory Visit | Attending: Cardiology | Admitting: Cardiology

## 2015-02-16 DIAGNOSIS — I509 Heart failure, unspecified: Secondary | ICD-10-CM | POA: Diagnosis not present

## 2015-02-16 LAB — GLUCOSE, CAPILLARY: GLUCOSE-CAPILLARY: 125 mg/dL — AB (ref 70–99)

## 2015-02-17 ENCOUNTER — Encounter: Payer: Self-pay | Admitting: Family Medicine

## 2015-02-17 ENCOUNTER — Ambulatory Visit (INDEPENDENT_AMBULATORY_CARE_PROVIDER_SITE_OTHER): Payer: 59 | Admitting: Family Medicine

## 2015-02-17 VITALS — BP 133/73 | HR 60 | Temp 98.1°F | Resp 18 | Ht 70.0 in | Wt 233.0 lb

## 2015-02-17 DIAGNOSIS — I5022 Chronic systolic (congestive) heart failure: Secondary | ICD-10-CM

## 2015-02-17 DIAGNOSIS — M12812 Other specific arthropathies, not elsewhere classified, left shoulder: Secondary | ICD-10-CM

## 2015-02-17 DIAGNOSIS — M12512 Traumatic arthropathy, left shoulder: Secondary | ICD-10-CM

## 2015-02-17 DIAGNOSIS — I2510 Atherosclerotic heart disease of native coronary artery without angina pectoris: Secondary | ICD-10-CM

## 2015-02-17 DIAGNOSIS — K7460 Unspecified cirrhosis of liver: Secondary | ICD-10-CM

## 2015-02-17 DIAGNOSIS — K7469 Other cirrhosis of liver: Secondary | ICD-10-CM

## 2015-02-17 DIAGNOSIS — K7581 Nonalcoholic steatohepatitis (NASH): Secondary | ICD-10-CM

## 2015-02-17 LAB — GLUCOSE, CAPILLARY: Glucose-Capillary: 156 mg/dL — ABNORMAL HIGH (ref 70–99)

## 2015-02-17 MED ORDER — FUROSEMIDE 40 MG PO TABS: ORAL_TABLET | ORAL | Status: DC

## 2015-02-17 MED ORDER — METHYLPREDNISOLONE ACETATE 40 MG/ML IJ SUSP
40.0000 mg | Freq: Once | INTRAMUSCULAR | Status: AC
  Administered 2015-02-17: 40 mg via INTRA_ARTICULAR

## 2015-02-17 NOTE — Progress Notes (Signed)
OFFICE NOTE  02/17/2015  CC:  Chief Complaint  Patient presents with  . Follow-up   HPI: Patient is a [name] who is here for 2 mo f/u NASH, CAD with ischemic cardiomyopathy, DM 2, also hx of left shoulder rotator cuff issues (esp since collapse with cardiac arrest a few months back). He had f/u with cardiologist 02/09/15 and his coreg was up-titrated and hydralazine increased.  He was not volume overloaded so lasix was continued as prn with instructions to start if wt 225 lbs or greater. Recent repeat ECHO showed stable findings, EF 35-40 %. He is on DAPT for CAD with stents, recent CBC and BMET were stable (see below). Says cardiac rehab is going well.  Reviewed all recent labs and echo results with pt.  In rehab last week he had a large mucous/blood clot come out of nose and he had a trickling nose bleed for about an hour. No bleed since that time.    Some increased sodium and fluid intake in the last week and he has noticed increased LE swelling. His wt is up 5 lbs in the last week.  Has ongoing left shoulder pain, RC impingement sx's, is requesting steroid injection today.  No neck pain.  The pain is hin the left shoulder anteriolateral region plus trapezius mm area, without radiation down arm, without paresthesias or arm weakness.  Glucoses have been normal per pt.  No hypoglycemia.  Pertinent PMH:  Past medical, surgical, social, and family history reviewed and no changes are noted since last office visit.  MEDS:  Outpatient Prescriptions Prior to Visit  Medication Sig Dispense Refill  . aspirin 81 MG chewable tablet Chew 1 tablet (81 mg total) by mouth daily.    [name] atorvastatin (LIPITOR) 20 MG tablet Take 1 tablet (20 mg total) by mouth daily at 6 PM. 30 tablet 3  . carvedilol (COREG) 12.5 MG tablet Take 1 tablet (12.5 mg total) by mouth 2 (two) times daily with a meal. 60 tablet 3  . clopidogrel (PLAVIX) 75 MG tablet Take 1 tablet (75 mg total) by mouth daily.  30 tablet 3  . hydrALAZINE (APRESOLINE) 25 MG tablet Take 1 tablet (25 mg total) by mouth 3 (three) times daily. 90 tablet 3  . insulin aspart (NOVOLOG) 100 UNIT/ML injection Inject 0-10 Units into the skin 3 (three) times daily before meals. Sliding scale    . insulin glargine (LANTUS) 100 UNIT/ML injection Inject 0.18 mLs (18 Units total) into the skin daily. Patient advised to take insulin in the morning after working nights;  If working first or second shifts, takes insulin at bedtime 10 mL 11  . isosorbide mononitrate (IMDUR) 30 MG 24 hr tablet Take 1 tablet (30 mg total) by mouth daily. 30 tablet 3  . nitroGLYCERIN (NITROSTAT) 0.4 MG SL tablet Place 1 tablet (0.4 mg total) under the tongue every 5 (five) minutes as needed for chest pain. 25 tablet 0  . glucose blood (FREESTYLE TEST STRIPS) test strip Use as instructed 100 each 6  . lidocaine (LIDODERM) 5 % Place 1 patch onto the skin daily. Remove & Discard patch within 12 hours or as directed by MD (Patient not taking: Reported on 02/17/2015) 30 patch 0  . acetaminophen (TYLENOL) 500 MG tablet Take 500 mg by mouth every 6 (six) hours as needed for moderate pain.     No facility-administered medications prior to visit.    PE: Blood pressure 133/73, pulse 60, temperature 98.1 F (36.7 C),  temperature source Temporal, resp. rate 18, height 5\' 10"  (1.778 m), weight 233 lb (105.688 kg), SpO2 99 %. Gen: Alert, well appearing.  Patient is oriented to person, place, time, and situation. CV: RRR LUNGS: CTA bilat, nonlabored resps. EXT; 2-3+ pitting edema in both LL's from mid tibia down into feet.   Left shouder: pain and decreased ROm with aBduction/IR/ER, also TTP around hook of acromion and over superior aspect of left trapezius.  +Impingement signs in left shoulder.  Neg drop sign.  UE strength 5/5 prox/dist bilat.  LAB: none today RECENT:  Lab Results  Component Value Date   WBC 3.9* 02/09/2015   HGB 10.9* 02/09/2015   HCT 32.4*  02/09/2015   MCV 88.5 02/09/2015   PLT 47* 02/09/2015     Chemistry      Component Value Date/Time   NA 137 02/09/2015 1615   K 4.8 02/09/2015 1615   CL 110 02/09/2015 1615   CO2 24 02/09/2015 1615   BUN 43* 02/09/2015 1615   CREATININE 1.08 02/09/2015 1615   CREATININE 1.07 12/18/2014 1016      Component Value Date/Time   CALCIUM 8.5 02/09/2015 1615   ALKPHOS 165* 12/18/2014 1016   AST 32 12/18/2014 1016   ALT 27 12/18/2014 1016   BILITOT 0.7 12/18/2014 1016     Lab Results  Component Value Date   CHOL 132 02/09/2015   HDL 41 02/09/2015   LDLCALC 59 02/09/2015   TRIG 160* 02/09/2015   CHOLHDL 3.2 02/09/2015     IMPRESSION AND PLAN:  1) CAD with ischemic cardiomyopathy: asymptomatic.  However, his LE edema and wt gain indicate fluid overload. Reminded pt of importance of sodium and fluid restrictions, also will restart lasix at 40mg  bid x 3d and recheck BMET at that time.  He'll change to 40mg  lasix qd at that time and we'll see how he is responding from an edema and wt standpoint after a few days of the 40mg  qd dosing. He is continuing DUPT, statin, coreg, and imdur.  2) Cirrhosis secondary to NASH: fluid and Na limitations.  Pancytopenia stable.  LFTs recently normal.  No sign of ascites at this time.  3) Left shoulder rotator cuff tendonitis/impingement syndrome: injection today.  Procedure: Therapeutic shoulder injection.  The patient's clinical condition is marked by substantial pain and/or significant functional disability.  Other conservative therapy has not provided relief, is contraindicated, or not appropriate.  There is a reasonable likelihood that injection will significantly improve the patient's pain and/or functional disability. Cleaned skin with alcohol swab, used posterolateral approach, Injected 1 ml 40mg /ml depo medrol + 2 ml of 1% lido w/out epi into subacromial space without resistance.  No immediate complications.  Patient tolerated procedure well.   Post-injection care discussed, including 20 min of icing 1-2 times in the next 4-8 hours, frequent non weight-bearing ROM exercises over the next few days, and general pain medication management.  4) DM 2, control much improved. He is doing well with Dr. Elvera Lennox managing his diabetes.  An After Visit Summary was printed and given to the patient.  FOLLOW UP: 2 mo

## 2015-02-17 NOTE — Patient Instructions (Signed)
Lasix 40 mg twice a day for today, tomorrow, and Thursday.  Then decrease to 40mg  lasix ONCE daily for 3 more days, then stop lasix. Go to [facility] lab [facility] on Friday 02/20/15 for lab.

## 2015-02-17 NOTE — Progress Notes (Signed)
Pre visit review using our clinic review tool, if applicable. No additional management support is needed unless otherwise documented below in the visit note. 

## 2015-02-18 ENCOUNTER — Encounter (HOSPITAL_COMMUNITY): Payer: Self-pay | Admitting: *Deleted

## 2015-02-18 ENCOUNTER — Encounter (HOSPITAL_COMMUNITY)
Admission: RE | Admit: 2015-02-18 | Discharge: 2015-02-18 | Disposition: A | Discharge status: Home / Self Care | Payer: 59 | Source: Ambulatory Visit | Attending: Cardiology | Admitting: Cardiology

## 2015-02-18 DIAGNOSIS — Z79899 Other long term (current) drug therapy: Secondary | ICD-10-CM | POA: Diagnosis not present

## 2015-02-18 DIAGNOSIS — I509 Heart failure, unspecified: Secondary | ICD-10-CM | POA: Insufficient documentation

## 2015-02-18 DIAGNOSIS — I252 Old myocardial infarction: Secondary | ICD-10-CM | POA: Diagnosis not present

## 2015-02-18 DIAGNOSIS — R040 Epistaxis: Secondary | ICD-10-CM | POA: Diagnosis not present

## 2015-02-18 NOTE — Progress Notes (Signed)
Reviewed home exercise guidelines with patient including endpoints, temperature precautions, target heart rate and rate of perceived exertion. Pt is walking as his mode of home exercise. Pt voices understanding of instructions given.

## 2015-02-18 NOTE — Progress Notes (Signed)
Pt arrived at cardiac rehab reporting the addition of lasix 40mg  BID x3 days per his PCP.  Pt started this yesterday.  Med list reconciled

## 2015-02-19 ENCOUNTER — Ambulatory Visit (INDEPENDENT_AMBULATORY_CARE_PROVIDER_SITE_OTHER): Payer: 59 | Admitting: Internal Medicine

## 2015-02-19 ENCOUNTER — Encounter: Payer: Self-pay | Admitting: Internal Medicine

## 2015-02-19 VITALS — BP 128/64 | HR 62 | Temp 97.8°F | Resp 12 | Wt 226.0 lb

## 2015-02-19 DIAGNOSIS — E1165 Type 2 diabetes mellitus with hyperglycemia: Secondary | ICD-10-CM

## 2015-02-19 DIAGNOSIS — E118 Type 2 diabetes mellitus with unspecified complications: Secondary | ICD-10-CM

## 2015-02-19 DIAGNOSIS — IMO0002 Reserved for concepts with insufficient information to code with codable children: Secondary | ICD-10-CM

## 2015-02-19 LAB — HEMOGLOBIN A1C: Hgb A1c MFr Bld: 6.3 % (ref 4.6–6.5)

## 2015-02-19 MED ORDER — INSULIN DEGLUDEC 100 UNIT/ML ~~LOC~~ SOPN: 18.0000 [IU] | PEN_INJECTOR | Freq: Every day | SUBCUTANEOUS | Status: DC

## 2015-02-19 NOTE — Patient Instructions (Signed)
Please stop Lantus and start Tresiba 18 units at bedtime. NovoLog mealtime: - 6 units with a smaller meal - 8 units with a regular meal - 10 units with a larger meal NovoLog SSI: - target: 130 - insulin sensitivity factor: 30 130-160: + 1 unit 161-190: + 2 units 191-210: + 3 units  >210: + 4 units   Please stop at the lab.   Please come back for a follow-up appointment in 3 months with your sugar log.

## 2015-02-19 NOTE — Progress Notes (Signed)
Patient ID: [name], male   DOB: 08/30/1961, 54 y.o.   MRN: [id number]  HPI: [name] is a 54 y.o.-year-old male, returning for f/u for DM2, dx 2012, insulin-dependent since dx, uncontrolled, with complications (CRI stage 2, CAD, ischemic cardiomyopathy, PN, DR, L 4 and 5th toes amputated 2/2 osteomyelitis - 09/2012). Last visit 2 mo ago. Pt is here with his wife who offers part of the hx.  He had cardio-respiratory arrest 10/02/2014 (hypotension, hyperkalemia) >> resuscitated.  He had stents placed for CAD.   Last hemoglobin A1c was: Lab Results  Component Value Date   HGBA1C 5.8* 10/25/2014   HGBA1C 5.8* 10/10/2014   HGBA1C 7.2* 07/19/2014   Pt is on a regimen of: Lantus 18 units at bedtime. NovoLog mealtime - not following the directions: - 6 units with a smaller meal - 10 units with a larger meal He uses 8 units for all meals. NovoLog SSI - not following the directions: - target: 130 - insulin sensitivity factor: 30 He uses a target of 130 and an ISF of 10. Previously on Victoza.  At last visits, we switched the Lantus to am when works nights - in the following way:  am lunch bedtime        Night Lantus          Night... Lantus          Night  Lantus      PM   Lantus     PM...   Lantus     PM   Lantus  AM   Lantus  AM...   Lantus  AM  Lantus        Night Lantus         Night... Lantus         Night  Lantus    He works 2 weeks shifts - but not back to work until 03/2015: - nights 11 pm - 7:30 am - pm: 3 pm - 11:30 pm - am: 7 am - 3:30 pm  Pt checks his sugars 3x a day and they are great after his discharge - highest 253 x1: - am: 86-126 >> 90-100 >> 90-124 - 2h after b'fast: n/c >> 154-164, 185, 253x1 - before lunch: 68 (when works nights)-136 >> 120s >> 128 - 2h after lunch: n/c >> 132 - before dinner: 126-137 >> 120s - 2h after dinner: n/c - bedtime: 120-130s >> 100-110 >> 134 - nighttime: n/c Lowest sugar was 80s; he has hypoglycemia awareness  at 80.   Meter: Freestyle Lite.  Pt's meals are: - Breakfast: yoghurt + fruit; PB + apple; eggs + toast - Lunch: leftovers or sandwich - Dinner: meat + starch (sometimes) + vegetables - Snacks: PB crackers, no soft drinks  He goes to cardiac rehabilitation now Monday Wednesday Friday.  - Has CKD, last BUN/creatinine:  Lab Results  Component Value Date   BUN 43* 02/09/2015   CREATININE 1.08 02/09/2015  On Lisinopril - last set of lipids: Lab Results  Component Value Date   CHOL 132 02/09/2015   HDL 41 02/09/2015   LDLCALC 59 02/09/2015   TRIG 160* 02/09/2015   CHOLHDL 3.2 02/09/2015   - last eye exam was in 08/28/2014. + DR. He had surgery in 08/2014. - + numbness and tingling in his feet.  Pt has a h/o pancreatitis (gall bladder stones), cirrhosis (NASH).  ROS: Constitutional: + weight gain, no fatigue, no subjective hypo-/hyperthermia Eyes: no blurry vision, no xerophthalmia ENT: no sore  throat, no nodules palpated in throat, no dysphagia/odynophagia, no hoarseness Cardiovascular: no CP/SOB/no palpitations/leg swelling Respiratory: no cough/SOB Gastrointestinal: no N/V/D/C Musculoskeletal: no muscle/+ joint aches Skin: no rashes Neurological: no tremors/numbness/tingling/dizziness  I reviewed pt's medications, allergies, PMH, social hx, family hx, and changes were documented in the history of present illness. Otherwise, unchanged from my initial visit note. He change the dose of hydralazine and carvedilol. Past Medical History  Diagnosis Date  . Heart murmur     ECHO 07/2011 showed mild AS and LVH.  TEE 09/2011 showed small PFO, bicuspid aortic valve but no stenosis or regurg.  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  Gen surg and GI recommended flagyl and cipro x 2 wks..  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 Juanda Chance 12/01/11 showed HEALED fistula, no other abnormality, recommended next colonoscopy  10 yrs.  . Cholelithiasis     u/s--no cholecystitis.  06/2014 u/s showed no stones, only GB sludge  . Peripheral neuropathy     Diabetic:  Autonomic (pelvic) and LE polyneuropathy--[facility] testing showed that it is likely from DM  . Diabetic foot ulcers     Poor healing; normal ABIs and TBIs  . Arthritis     back L3 - L5  . Hyperactive gag reflex   . History of pyelonephritis   . Type II or unspecified type diabetes mellitus with neurological manifestations, not stated as uncontrolled     IDDM: neuro, renal, and ophth complications  . Wears dentures     upper  . Osteomyelitis of toe of left foot     left 4th and 5th toes--now s/p amputation of these areas  . Diabetic retinopathy associated with type 2 diabetes mellitus     Laser tx in both eyes (Dr. Allyne Gee)  . Diabetic nephropathy     Proteinuria and CrCl 55-65  . Chronic renal insufficiency, stage III (moderate)     CrCl 50s-80s 2014 to pre-VFIB arrest 10/02/14, at which time he sustained AKI.  . Lumbar spondylosis     MRI 08/25/11 with multilevel facet dz, small disc protrusion at L5-S1 to the right without impingement  . Ischemic cardiomyopathy 07/22/14    Myoview w/out ischemia but large scar (inferior/apex) w/ global hypokinesis--medical mgmt  . History of pancreatitis 2014 and 2015    Suspected biliary: passing small gallstones: surgery declined to remove GB 07/2014.  . Aspiration pneumonia   . Thrombocytopenia   . Portal hypertension   . Liver cirrhosis secondary to NASH     ?+ alcohol?.  With hx of splenomegaly and ascities (dx approx 2012)  . History of gram negative sepsis 2015    e coli  . Ventricular fibrillation 10/02/14    Resuscitated, hospitalized, 3 V CAD detected, DES stent to mid LAD and to RCA were placed.   Past Surgical History  Procedure Laterality Date  . Knee arthroscopy  2001    Bilateral  . Amputation  10/24/2012    Procedure: AMPUTATION RAY;  Surgeon: Sherri Rad, MD;  Location: [facility];  Service: Orthopedics;  Laterality: Left;  left 4th toe amputation through MTP joint, 5th Ray amputation   . Knee surgery  2001  . Hida scan  06/2014    Normal  . Transthoracic echocardiogram  07/19/14;01/07/15    Mild LV dilation and hypertrophy, EF 35-40%, no wall motion abnormalities, grade II diast dysfxn, bicuspid aortic valve with mild AS and mild  AI.  No change on 12/2014 echo.  . Cardiovascular stress test  07/2014    Myoview w/out ischemia but large scar (inferior/apex) w/ global hypokinesis--medical mgmt  . Eye surgery  08/2014    Retinal hemorrhage evacuation  . Coronary angioplasty with stent placement  09/2014    DES to mid LAD and RCA; EF 40% by cardiac MRI  . Cardiac mri  09/2014    Moderately dilated LV.  EF 40%, multiple wall motion abnormalities, normal RV fxn.  Some areas of scarring.  . Left heart catheterization with coronary angiogram N/A 10/10/2014    Procedure: LEFT HEART CATHETERIZATION WITH CORONARY ANGIOGRAM;  Surgeon: Peter M Swaziland, MD;  Location: [facility];  Service: Cardiovascular;  Laterality: N/A;  . Left heart catheterization with coronary angiogram N/A 10/15/2014    Procedure: LEFT HEART CATHETERIZATION WITH CORONARY ANGIOGRAM;  Surgeon: Iran Ouch, MD;  Location: [facility] CATH LAB;  Service: Cardiovascular;  Laterality: N/A;   History   Social History  . Marital Status: Single    Spouse Name: N/A    Number of Children: 0   Occupational History  . worker   Social History Main Topics  . Smoking status: Never Smoker   . Smokeless tobacco: Current User    Types: Chew  . Alcohol Use: No     Comment: Quit drinking alcohol 08/20/2011  . Drug Use: No   Social History Narrative   Single, no children.  Lives in [place], relocated from [place], [place] in 2008.   Chews tobacco but does not smoke.   Drinks 12 Beers daily---patient did not initially report/admit this--as of 2013 he has quit this.   Works for [employer] in [place] (they make  [employer] products).   Current Outpatient Prescriptions on File Prior to Visit  Medication Sig Dispense Refill  . aspirin 81 MG chewable tablet Chew 1 tablet (81 mg total) by mouth daily.    [name] atorvastatin (LIPITOR) 20 MG tablet Take 1 tablet (20 mg total) by mouth daily at 6 PM. 30 tablet 3  . carvedilol (COREG) 12.5 MG tablet Take 1 tablet (12.5 mg total) by mouth 2 (two) times daily with a meal. 60 tablet 3  . clopidogrel (PLAVIX) 75 MG tablet Take 1 tablet (75 mg total) by mouth daily. 30 tablet 3  . furosemide (LASIX) 40 MG tablet 1-2 tabs po bid prn, as directed by MD 60 tablet 6  . glucose blood (FREESTYLE TEST STRIPS) test strip Use as instructed 100 each 6  . hydrALAZINE (APRESOLINE) 25 MG tablet Take 1 tablet (25 mg total) by mouth 3 (three) times daily. 90 tablet 3  . insulin aspart (NOVOLOG) 100 UNIT/ML injection Inject 0-10 Units into the skin 3 (three) times daily before meals. Sliding scale    . insulin glargine (LANTUS) 100 UNIT/ML injection Inject 0.18 mLs (18 Units total) into the skin daily. Patient advised to take insulin in the morning after working nights;  If working first or second shifts, takes insulin at bedtime 10 mL 11  . isosorbide mononitrate (IMDUR) 30 MG 24 hr tablet Take 1 tablet (30 mg total) by mouth daily. 30 tablet 3  . lidocaine (LIDODERM) 5 % Place 1 patch onto the skin daily. Remove & Discard patch within 12 hours or as directed by MD 30 patch 0  . nitroGLYCERIN (NITROSTAT) 0.4 MG SL tablet Place 1 tablet (0.4 mg total) under the tongue every 5 (five) minutes as needed for chest pain. 25 tablet  0   No current facility-administered medications on file prior to visit.   Allergies  Allergen Reactions  . Iodine Other (See Comments)    Family history of anaphylaxis, unsure if patient has reaction  . Other Shortness Of Breath, Itching and Other (See Comments)    PERFUMES - SNEEZING  . Hydralazine Hcl Hives  . Naproxen Nausea And Vomiting   Family History   Problem Relation Age of Onset  . Heart disease Father   . Diabetes type II Sister    PE: BP 128/64 mmHg  Pulse 62  Temp(Src) 97.8 F (36.6 C) (Oral)  Resp 12  Wt 226 lb (102.513 kg)  SpO2 96% Body mass index is 32.43 kg/(m^2).  Wt Readings from Last 3 Encounters:  02/19/15 226 lb (102.513 kg)  02/17/15 233 lb (105.688 kg)  02/09/15 228 lb (103.42 kg)   Constitutional: overweight, in NAD Eyes: PERRLA, EOMI, no exophthalmos ENT: moist mucous membranes, no thyromegaly, no cervical lymphadenopathy Cardiovascular: RRR, No MRG Respiratory: CTA B Gastrointestinal: abdomen soft, NT, ND, BS+ Musculoskeletal: no deformities, strength intact in all 4 Skin: moist, warm, no rashes Neurological: no tremor with outstretched hands, DTR normal in all 4  ASSESSMENT: 1. DM2, insulin-dependent, controlled, with complications - CRI stage 2 - Cardiomyopathy - PN - DR - L 4 and 5th toes amputated 2/2 osteomyelitis - 09/2012  PLAN:  1. Patient with long-standing, controlled diabetes, on basal-bolus regimen, with good CBG control. We will try to start Tresiba  instead of Lantus, since he will start shift work in a month. This is more flexible, and he can take it at different times of day. If this is not covered by his insurance, we can stay with Lantus. I changed the doses of his NovoLog so that he has more dose flexibility with his meals, also. I advised him to inject only 6 units with a meal if he plans to go exercise (cardiac rehabilitation) after that meal. I also relaxed his sliding scale, since he was still using his old one.  Patient Instructions  Please stop Lantus and start Tresiba 18 units at bedtime. NovoLog mealtime: - 6 units with a smaller meal - 8 units with a regular meal - 10 units with a larger meal NovoLog SSI: - target: 130 - insulin sensitivity factor: 30 130-160: + 1 unit 161-190: + 2 units 191-210: + 3 units  >210: + 4 units   Please stop at the lab.   Please  come back for a follow-up appointment in 3 months with your sugar log.  - continue checking sugars at different times of the day - check 2 times a day, rotating checks - advised for yearly eye exams >> he is up to date - had the flu vaccine through work this season - will check HbA1c today. - Return to clinic in 3 mo with sugar log   Office Visit on 02/19/2015  Component Date Value Ref Range Status  . Hgb A1c MFr Bld 02/19/2015 6.3  4.6 - 6.5 % Final   Glycemic Control Guidelines for People with Diabetes:Non Diabetic:  <6%Goal of Therapy: <7%Additional Action Suggested:  >8%    HbA1c a little higher, but still very good.

## 2015-02-20 ENCOUNTER — Encounter (HOSPITAL_COMMUNITY)
Admission: RE | Admit: 2015-02-20 | Discharge: 2015-02-20 | Disposition: A | Discharge status: Home / Self Care | Payer: 59 | Source: Ambulatory Visit | Attending: Cardiology | Admitting: Cardiology

## 2015-02-20 DIAGNOSIS — I509 Heart failure, unspecified: Secondary | ICD-10-CM | POA: Diagnosis not present

## 2015-02-20 LAB — BASIC METABOLIC PANEL
BUN: 53 mg/dL — ABNORMAL HIGH (ref 6–23)
CO2: 25 mEq/L (ref 19–32)
Calcium: 8.4 mg/dL (ref 8.4–10.5)
Chloride: 105 mEq/L (ref 96–112)
Creat: 1.27 mg/dL (ref 0.50–1.35)
Glucose, Bld: 122 mg/dL — ABNORMAL HIGH (ref 70–99)
POTASSIUM: 5.4 meq/L — AB (ref 3.5–5.3)
SODIUM: 139 meq/L (ref 135–145)

## 2015-02-20 LAB — GLUCOSE, CAPILLARY: GLUCOSE-CAPILLARY: 236 mg/dL — AB (ref 70–99)

## 2015-02-22 ENCOUNTER — Encounter: Payer: Self-pay | Admitting: Family Medicine

## 2015-02-23 ENCOUNTER — Encounter (HOSPITAL_COMMUNITY)
Admission: RE | Admit: 2015-02-23 | Discharge: 2015-02-23 | Disposition: A | Discharge status: Home / Self Care | Payer: 59 | Source: Ambulatory Visit | Attending: Cardiology | Admitting: Cardiology

## 2015-02-23 DIAGNOSIS — I509 Heart failure, unspecified: Secondary | ICD-10-CM | POA: Diagnosis not present

## 2015-02-23 LAB — GLUCOSE, CAPILLARY
Glucose-Capillary: 60 mg/dL — ABNORMAL LOW (ref 70–99)
Glucose-Capillary: 184 mg/dL — ABNORMAL HIGH (ref 70–99)
Glucose-Capillary: 89 mg/dL (ref 70–99)

## 2015-02-23 NOTE — Progress Notes (Signed)
Pt arrived at cardiac rehab c/o feeling weak, shaky and diaphoretic.  CBG-60, BP- 155/71, telemetry-NSR, HR-70.  Pt given lemonade and banana.  10 minutes later pt c/o cold sweat and feeling lightheaded. Pt denies chest pain, dyspnea.   Pt given glucose gel and gingerale.  15 minute recheck CBG- 89.   Pt reports resolution of symptoms, therefore given peanut butter crackers.  Pt walked around track to assess symptoms with activity.     Pt denies change in regimen and ate oatmeal this morning which is often usual for him.  Pt has been prescribed new insulin, triseba by Dr. Elvera Lennox however he reports he has not started this yet.   He is currently using the remainder of his lantus before starting the new medication.   Pt did not exercise.  Recheck CBG- 189. BP- 104/60.  HR-74.  Pt able to walk track without difficulty, no return to symptoms.  Will continue to monitor.    Pt reports he currently drinks 2 34 oz bottles of water daily in addition to morning coffee and evening iced tea.  Pt educated on appropriate daily fluid intake.   Pt also reports he is unsure about his physical ability to return to his current job.  Pt states his job requires frequent stair climbing, walking, standing and heavy lifting for 8 hours.  Vocational rehab packet given.

## 2015-02-25 ENCOUNTER — Encounter (HOSPITAL_COMMUNITY)
Admission: RE | Admit: 2015-02-25 | Discharge: 2015-02-25 | Disposition: A | Discharge status: Home / Self Care | Payer: 59 | Source: Ambulatory Visit | Attending: Cardiology | Admitting: Cardiology

## 2015-02-25 DIAGNOSIS — I509 Heart failure, unspecified: Secondary | ICD-10-CM | POA: Diagnosis not present

## 2015-02-25 NOTE — Progress Notes (Signed)
[name] 54 y.o. male Nutrition Note Spoke with pt. Nutrition Plan including most recent lipids and Nutrition Survey goals reviewed with pt. Pt is following Step 2 of the Therapeutic Lifestyle Changes diet. Pt is watching sodium intake by choosing more fresh/frozen foods and using Mrs. Dash to season foods. Pt states he was unaware of his 2L fluid restriction prior to the other day "when my weight was up and I had to take lasix." Pt reports he is currently following his fluid restriction and monitoring his potassium due to hyperkalemia. Potassium Content of foods discussed. Pt is diabetic. Last A1c indicates blood glucose well-controlled. Pt checks CBG's before each meal and after breakfast on exercise days. Pt reports post-prandial CBG 154 mg/dL this morning. This Clinical research associate went over Diabetes Education test results. Pt expressed understanding of the information reviewed. Pt aware of nutrition education classes offered and plans on attending nutrition classes. Lab Results  Component Value Date   HGBA1C 6.3 02/19/2015   Lab Results  Component Value Date   K 5.4* 02/20/2015   Lab Results  Component Value Date   CHOL 132 02/09/2015   HDL 41 02/09/2015   LDLCALC 59 02/09/2015   TRIG 160* 02/09/2015   CHOLHDL 3.2 02/09/2015    Nutrition Diagnosis ? Food-and nutrition-related knowledge deficit related to lack of exposure to information as related to diagnosis of: ? CVD ? DM  ? Obesity related to excessive energy intake as evidenced by a BMI of 33.9  Nutrition RX/ Estimated Daily Nutrition Needs for: wt loss  1850-2350 Kcal, 50-65 gm fat, 12-14 gm sat fat, 1.8-2.3 gm trans-fat, <1500 mg sodium, 250-325 gm CHO   Nutrition Intervention ? Pt's individual nutrition plan reviewed with pt. ? Benefits of adopting Therapeutic Lifestyle Changes discussed when Medficts reviewed. ? Pt to attend the Portion Distortion class ? Pt to attend the  ? Nutrition I class                     ? Nutrition II  class        ? Diabetes Blitz class       ? Diabetes Q & A class ? Pt given handouts for: ? Potassium Content of Foods ? Continue client-centered nutrition education by RD, as part of interdisciplinary care. Goal(s) ? Pt to identify food quantities necessary to achieve: ? wt loss to a goal wt of 206-224 lb (94-102.2 kg) at graduation from cardiac rehab.  ? Pt to describe the benefit of including fruits, vegetables, whole grains, and low-fat dairy products in a heart healthy meal plan. ? Use pre-meal and post-meal CBG's and A1c to determine whether adjustments in food/meal planning will be beneficial or if any meds need to be combined with nutrition therapy. Monitor and Evaluate progress toward nutrition goal with team. Nutrition Risk: Change to Moderate [name], M.Ed, RD, LDN, CDE 02/25/2015 8:37 AM

## 2015-02-26 LAB — GLUCOSE, CAPILLARY: Glucose-Capillary: 109 mg/dL — ABNORMAL HIGH (ref 70–99)

## 2015-02-27 ENCOUNTER — Encounter (HOSPITAL_COMMUNITY)
Admission: RE | Admit: 2015-02-27 | Discharge: 2015-02-27 | Disposition: A | Discharge status: Home / Self Care | Payer: 59 | Source: Ambulatory Visit | Attending: Cardiology | Admitting: Cardiology

## 2015-02-27 DIAGNOSIS — I509 Heart failure, unspecified: Secondary | ICD-10-CM | POA: Diagnosis not present

## 2015-02-27 LAB — GLUCOSE, CAPILLARY: Glucose-Capillary: 102 mg/dL — ABNORMAL HIGH (ref 70–99)

## 2015-03-02 ENCOUNTER — Encounter (HOSPITAL_COMMUNITY)
Admission: RE | Admit: 2015-03-02 | Discharge: 2015-03-02 | Disposition: A | Discharge status: Home / Self Care | Payer: 59 | Source: Ambulatory Visit | Attending: Cardiology | Admitting: Cardiology

## 2015-03-02 DIAGNOSIS — I509 Heart failure, unspecified: Secondary | ICD-10-CM | POA: Diagnosis not present

## 2015-03-02 LAB — GLUCOSE, CAPILLARY: Glucose-Capillary: 221 mg/dL — ABNORMAL HIGH (ref 70–99)

## 2015-03-04 ENCOUNTER — Encounter (HOSPITAL_COMMUNITY)
Admission: RE | Admit: 2015-03-04 | Discharge: 2015-03-04 | Disposition: A | Discharge status: Home / Self Care | Payer: 59 | Source: Ambulatory Visit | Attending: Cardiology | Admitting: Cardiology

## 2015-03-04 DIAGNOSIS — I509 Heart failure, unspecified: Secondary | ICD-10-CM | POA: Diagnosis not present

## 2015-03-05 ENCOUNTER — Telehealth: Payer: Self-pay | Admitting: Family Medicine

## 2015-03-05 MED ORDER — INSULIN PEN NEEDLE 32G X 6 MM MISC: Status: DC

## 2015-03-05 NOTE — Telephone Encounter (Signed)
Patient aware.

## 2015-03-05 NOTE — Telephone Encounter (Signed)
Rx for novofine needles sent to pharmacy.

## 2015-03-05 NOTE — Telephone Encounter (Signed)
Patient is needing Rx for 22mm Nova fine pen needles. Please advise?

## 2015-03-05 NOTE — Telephone Encounter (Signed)
Pt contacted Pharmacy for refill on his needles and they instructed him to call us and have a RX sent in.

## 2015-03-06 ENCOUNTER — Encounter (HOSPITAL_COMMUNITY)
Admission: RE | Admit: 2015-03-06 | Discharge: 2015-03-06 | Disposition: A | Discharge status: Home / Self Care | Payer: 59 | Source: Ambulatory Visit | Attending: Cardiology | Admitting: Cardiology

## 2015-03-06 ENCOUNTER — Telehealth (HOSPITAL_COMMUNITY): Payer: Self-pay | Admitting: Vascular Surgery

## 2015-03-06 NOTE — Progress Notes (Addendum)
Pt lost balance at cardiac rehab during functional fitness class hitting his right wrist on wall.  Right wrist hematoma present.  Approximate size half dollar.  Ice pack applied.   Pt denies pain, full wrist rom, no swelling elsewhere.  Pt denies injury to other areas of body.   Pt states his feet were tangled when he was rising up from seated position on floor.  Pt  did not use heavy object to steady himself upon rising as instructed.  Dr. Alford Highland nurse Herbert Seta made aware.  Pt instructed apply ice at home and seek medical care from PCP or Urgent care if swelling worsens, pain or swelling in other areas of arm/wrist occur.  Pt also reminded to use assistive devices/steady objects to get off floor at all times for safety.  Understanding verbalized.    Vocational rehab contacted on pt behalf.  Pt spoke to vocational rehab representative.  Orientation appointment scheduled.  Pt verbalized understanding of date and time. Voc rehab will mail packet to pt home with information.

## 2015-03-06 NOTE — Telephone Encounter (Signed)
Spoke w/Joanne she states pt lost balance and tumbled over during stretches and bumped his arm, she states he was not dizzy and did not pass out.  She states he has a place the size of a silver dollar on his arm they have iced it and it seems to be decreasing in size.  Advised if needs x-ray or if pain continue and he is concerned should see urgent care of pcp, she will advise pt

## 2015-03-06 NOTE — Telephone Encounter (Signed)
Nurse from cardiac rehab called pt fell against the wall during stretches this morning he has a hematoma on his arm she was calling to report it and she wanted Amy to take a look at it .[name] Please advise

## 2015-03-09 ENCOUNTER — Encounter (HOSPITAL_COMMUNITY)
Admission: RE | Admit: 2015-03-09 | Discharge: 2015-03-09 | Disposition: A | Discharge status: Home / Self Care | Payer: 59 | Source: Ambulatory Visit | Attending: Cardiology | Admitting: Cardiology

## 2015-03-09 DIAGNOSIS — I509 Heart failure, unspecified: Secondary | ICD-10-CM | POA: Diagnosis not present

## 2015-03-09 LAB — GLUCOSE, CAPILLARY: Glucose-Capillary: 209 mg/dL — ABNORMAL HIGH (ref 70–99)

## 2015-03-10 MED FILL — Glucose Gel 40%: ORAL | Qty: 1 | Status: AC

## 2015-03-11 ENCOUNTER — Encounter (HOSPITAL_COMMUNITY)
Admission: RE | Admit: 2015-03-11 | Discharge: 2015-03-11 | Disposition: A | Discharge status: Home / Self Care | Payer: 59 | Source: Ambulatory Visit | Attending: Cardiology | Admitting: Cardiology

## 2015-03-11 DIAGNOSIS — I509 Heart failure, unspecified: Secondary | ICD-10-CM | POA: Diagnosis not present

## 2015-03-11 LAB — GLUCOSE, CAPILLARY
GLUCOSE-CAPILLARY: 148 mg/dL — AB (ref 70–99)
GLUCOSE-CAPILLARY: 85 mg/dL (ref 70–99)

## 2015-03-13 ENCOUNTER — Encounter (HOSPITAL_COMMUNITY)
Admission: RE | Admit: 2015-03-13 | Discharge: 2015-03-13 | Disposition: A | Discharge status: Home / Self Care | Payer: 59 | Source: Ambulatory Visit | Attending: Cardiology | Admitting: Cardiology

## 2015-03-13 DIAGNOSIS — I509 Heart failure, unspecified: Secondary | ICD-10-CM | POA: Diagnosis not present

## 2015-03-13 LAB — GLUCOSE, CAPILLARY: Glucose-Capillary: 81 mg/dL (ref 70–99)

## 2015-03-16 ENCOUNTER — Encounter (HOSPITAL_COMMUNITY)
Admission: RE | Admit: 2015-03-16 | Discharge: 2015-03-16 | Disposition: A | Discharge status: Home / Self Care | Payer: 59 | Source: Ambulatory Visit | Attending: Cardiology | Admitting: Cardiology

## 2015-03-16 DIAGNOSIS — I509 Heart failure, unspecified: Secondary | ICD-10-CM | POA: Diagnosis not present

## 2015-03-16 LAB — GLUCOSE, CAPILLARY
Glucose-Capillary: 103 mg/dL — ABNORMAL HIGH (ref 70–99)
Glucose-Capillary: 87 mg/dL (ref 70–99)

## 2015-03-18 ENCOUNTER — Encounter (HOSPITAL_COMMUNITY)
Admission: RE | Admit: 2015-03-18 | Discharge: 2015-03-18 | Disposition: A | Discharge status: Home / Self Care | Payer: 59 | Source: Ambulatory Visit | Attending: Cardiology | Admitting: Cardiology

## 2015-03-18 DIAGNOSIS — I509 Heart failure, unspecified: Secondary | ICD-10-CM | POA: Diagnosis not present

## 2015-03-18 LAB — GLUCOSE, CAPILLARY: Glucose-Capillary: 96 mg/dL (ref 70–99)

## 2015-03-20 ENCOUNTER — Encounter (HOSPITAL_COMMUNITY)
Admission: RE | Admit: 2015-03-20 | Discharge: 2015-03-20 | Disposition: A | Discharge status: Home / Self Care | Payer: 59 | Source: Ambulatory Visit | Attending: Cardiology | Admitting: Cardiology

## 2015-03-20 ENCOUNTER — Telehealth (HOSPITAL_COMMUNITY): Payer: Self-pay | Admitting: *Deleted

## 2015-03-20 DIAGNOSIS — I252 Old myocardial infarction: Secondary | ICD-10-CM | POA: Diagnosis not present

## 2015-03-20 DIAGNOSIS — I509 Heart failure, unspecified: Secondary | ICD-10-CM | POA: Insufficient documentation

## 2015-03-20 DIAGNOSIS — Z79899 Other long term (current) drug therapy: Secondary | ICD-10-CM | POA: Diagnosis not present

## 2015-03-20 DIAGNOSIS — R040 Epistaxis: Secondary | ICD-10-CM | POA: Diagnosis not present

## 2015-03-20 NOTE — Telephone Encounter (Signed)
Pt made aware of medication changes.  He had already taken one 40 mg tablet today per Cardiac Rehab and was instructed to take another dose tomorrow Saturday and Sunday.  Pt agreed.  Pt educated to decrease salt intake. Pt stated he has not had any salt he used fresh and bagged veggies and Ms. Dash.  Pt made aware to call clinic if there are not changes in his weight or SOB. Pt agreed and not additional questions.

## 2015-03-20 NOTE — Telephone Encounter (Signed)
Spoke with [name] with cardiac rehab advised pt to take two lasix tablets today (per megan) and to call if that does not help.  Pt weight up 3.5lbs ankle edema sob on exertion and had abdominal tightness. Pt not currently taking lasix last chart note advised pt to take 1-2 tablets of lasix for weight gain.

## 2015-03-20 NOTE — Telephone Encounter (Signed)
Take Lasix 40 mg daily x 3 days then stop.  Cut back on sodium in diet.

## 2015-03-20 NOTE — Progress Notes (Addendum)
Pt arrived at cardiac rehab with 3.5lb weight gain since 03/18/15.  Pt c/o dyspnea on exertion and  abdominal bloating.  Denies dyspnea at rest or orthopnea.  Lungs clear, O2 sat-98%.  Trace ankle edema Left > right.  PC to Dr. Alford Highland office.  Per [place], CMA new order per Dr. Shirlee Latch for pt to take lasix 2 tabs today.  Pt verbalized understanding.

## 2015-03-20 NOTE — Telephone Encounter (Signed)
[name] with cardiac rehab called sated pt weight up 3.5lbs, ankle edema, SOB on exertion, and some abdominal tightness. Please advise

## 2015-03-23 ENCOUNTER — Encounter (HOSPITAL_COMMUNITY): Payer: 59

## 2015-03-25 ENCOUNTER — Encounter (HOSPITAL_COMMUNITY)
Admission: RE | Admit: 2015-03-25 | Discharge: 2015-03-25 | Disposition: A | Discharge status: Home / Self Care | Payer: 59 | Source: Ambulatory Visit | Attending: Cardiology | Admitting: Cardiology

## 2015-03-25 DIAGNOSIS — I509 Heart failure, unspecified: Secondary | ICD-10-CM | POA: Diagnosis not present

## 2015-03-25 LAB — GLUCOSE, CAPILLARY
GLUCOSE-CAPILLARY: 77 mg/dL (ref 70–99)
Glucose-Capillary: 108 mg/dL — ABNORMAL HIGH (ref 70–99)

## 2015-03-27 ENCOUNTER — Encounter (HOSPITAL_COMMUNITY)
Admission: RE | Admit: 2015-03-27 | Discharge: 2015-03-27 | Disposition: A | Discharge status: Home / Self Care | Payer: 59 | Source: Ambulatory Visit | Attending: Cardiology | Admitting: Cardiology

## 2015-03-27 ENCOUNTER — Telehealth (HOSPITAL_COMMUNITY): Payer: Self-pay | Admitting: *Deleted

## 2015-03-27 DIAGNOSIS — I509 Heart failure, unspecified: Secondary | ICD-10-CM | POA: Diagnosis not present

## 2015-03-27 LAB — GLUCOSE, CAPILLARY
Glucose-Capillary: 65 mg/dL — ABNORMAL LOW (ref 70–99)
Glucose-Capillary: 140 mg/dL — ABNORMAL HIGH (ref 70–99)

## 2015-03-27 NOTE — Progress Notes (Signed)
Pt asked rehab staff to check his blood glucose during the third station.  Pt felt clammy and that his blood glucose was dropping.  Post exercise check 65.  Pt given lemonade to drink.  Talked with pt about his breakfast this morning. Pt blood glucose this am at home was 140. Pt took 8 units of humolog insulin.  Pt ate oatmeal and a piece of fruit.  Pt blood glucose rechecked 140.  Pt given protein snack to take with him.  Reviewed last office note with endocrinologist. Pt was instructed to only take 6 units of humolog insulin on exercise days. Instructed pt on the regimen for his insulin administration with the change in the sliding scale - noted HgA1C was 5.8 10/2014.  Gave pt a written copy of instructions as well for him to refer to at home.  Pt stated that he forgot he was only to take 6 units of insulin on exercise days.  Pt instructed to monitor his blood glucose today and to eat a meal when he arrives home.  Pt aware he can begin driving on April 02HE.  Pt concerned about going back to work on the 22nd.  Pt asked rehab staff to call to see if he needed to be seen in May. Pt was unclear if he was suppose to and had not receive a call.  [facility]ed [facility].  Pt will need to be seen in April.  Appointment made for April 18th at 9:40.  This was relayed to pt who is in agreement and verbalizes understanding. Alanson Aly, BSN

## 2015-03-27 NOTE — Telephone Encounter (Signed)
Pt inquired about being able to fly to [place]. Advised pt it was ok to go as long as symptoms were stable (per DM)

## 2015-03-30 ENCOUNTER — Encounter (HOSPITAL_COMMUNITY)
Admission: RE | Admit: 2015-03-30 | Discharge: 2015-03-30 | Disposition: A | Discharge status: Home / Self Care | Payer: 59 | Source: Ambulatory Visit | Attending: Cardiology | Admitting: Cardiology

## 2015-03-30 DIAGNOSIS — I509 Heart failure, unspecified: Secondary | ICD-10-CM | POA: Diagnosis not present

## 2015-03-30 LAB — GLUCOSE, CAPILLARY
GLUCOSE-CAPILLARY: 309 mg/dL — AB (ref 70–99)
Glucose-Capillary: 317 mg/dL — ABNORMAL HIGH (ref 70–99)

## 2015-04-01 ENCOUNTER — Encounter (HOSPITAL_COMMUNITY)
Admission: RE | Admit: 2015-04-01 | Discharge: 2015-04-01 | Disposition: A | Discharge status: Home / Self Care | Payer: 59 | Source: Ambulatory Visit | Attending: Cardiology | Admitting: Cardiology

## 2015-04-01 DIAGNOSIS — I509 Heart failure, unspecified: Secondary | ICD-10-CM | POA: Diagnosis not present

## 2015-04-01 LAB — GLUCOSE, CAPILLARY
Glucose-Capillary: 110 mg/dL — ABNORMAL HIGH (ref 70–99)
Glucose-Capillary: 81 mg/dL (ref 70–99)
Glucose-Capillary: 121 mg/dL — ABNORMAL HIGH (ref 70–99)

## 2015-04-01 NOTE — Progress Notes (Signed)
(  late entry for 03/30/15 8:30am)  Pt CBG - 317 upon arrival to cardiac rehab.  Pt reports he ate honey nut cheerios this morning for breakfast and took humalog insulin 6 units as  Instructed.  Urine negative for ketones.  Pt drank H20.  Per departmental protocol, pt did not exercise with CBG >300.  At pt request, 15 minute recheck CBG obtained to verify inability to exercise.  Repeat CBG-309.   Pt instructed to continue current regimen with exception of avoiding concentrated sweets such as honey nut cheerios. Pt verbalized understanding.

## 2015-04-03 ENCOUNTER — Encounter (HOSPITAL_COMMUNITY)
Admission: RE | Admit: 2015-04-03 | Discharge: 2015-04-03 | Disposition: A | Discharge status: Home / Self Care | Payer: 59 | Source: Ambulatory Visit | Attending: Cardiology | Admitting: Cardiology

## 2015-04-03 DIAGNOSIS — I509 Heart failure, unspecified: Secondary | ICD-10-CM | POA: Diagnosis not present

## 2015-04-03 LAB — GLUCOSE, CAPILLARY
Glucose-Capillary: 76 mg/dL (ref 70–99)
Glucose-Capillary: 98 mg/dL (ref 70–99)

## 2015-04-03 NOTE — Progress Notes (Signed)
Pt c/o feeling hot and diaphoretic after functional fitness education session.  CBG-72.  Pt given gingerale and apple.  Pt reports he ate usual breakfast of oatmeal and coffee.  He then took novalog 6 units.  Per departmental policy, pt did not exercise.  Pt did meet with Mickle Plumb, RD diabetes educator.  15 minute recheck CBG-98.  Pt has scheduled appt next week with his endocrinologist.

## 2015-04-03 NOTE — Progress Notes (Signed)
[name] 54 y.o. male Nutrition Note Spoke with pt. Pt ate oatmeal with honey, cinnamon, and berries and a cup of coffee with a splash of milk. Post-prandial CBG at home reportedly 147 mg/dL. Pt gave himself 6 units of Novolog after eating. Pre-exercise CBG 72 mg/dL. Pt agreeable to try holding am Novolog on exercise days over the next week and bring his Novolog with him to exercise. Pt reports he has an appointment with his endocrinologist Monday 4/18 to discuss DM management. Last A1c indicates blood glucose well-controlled. Continue client-centered nutrition education by RD as part of interdisciplinary care.  Monitor and evaluate progress toward nutrition goal of good blood glucose control with team.  Mickle Plumb, M.Ed, RD, LDN, CDE 04/03/2015 9:19 AM Lab Results  Component Value Date   HGBA1C 6.3 02/19/2015

## 2015-04-06 ENCOUNTER — Encounter (HOSPITAL_COMMUNITY): Payer: Self-pay

## 2015-04-06 ENCOUNTER — Ambulatory Visit (HOSPITAL_COMMUNITY)
Admission: RE | Admit: 2015-04-06 | Discharge: 2015-04-06 | Disposition: A | Discharge status: Home / Self Care | Payer: 59 | Source: Ambulatory Visit | Attending: Cardiology | Admitting: Cardiology

## 2015-04-06 ENCOUNTER — Encounter (HOSPITAL_COMMUNITY)
Admission: RE | Admit: 2015-04-06 | Discharge: 2015-04-06 | Disposition: A | Discharge status: Home / Self Care | Payer: 59 | Source: Ambulatory Visit | Attending: Cardiology | Admitting: Cardiology

## 2015-04-06 VITALS — BP 130/78 | HR 65 | Wt 228.8 lb

## 2015-04-06 DIAGNOSIS — Z7982 Long term (current) use of aspirin: Secondary | ICD-10-CM | POA: Insufficient documentation

## 2015-04-06 DIAGNOSIS — I509 Heart failure, unspecified: Secondary | ICD-10-CM | POA: Diagnosis not present

## 2015-04-06 DIAGNOSIS — E119 Type 2 diabetes mellitus without complications: Secondary | ICD-10-CM | POA: Diagnosis not present

## 2015-04-06 DIAGNOSIS — Z794 Long term (current) use of insulin: Secondary | ICD-10-CM | POA: Diagnosis not present

## 2015-04-06 DIAGNOSIS — I5022 Chronic systolic (congestive) heart failure: Secondary | ICD-10-CM | POA: Diagnosis not present

## 2015-04-06 DIAGNOSIS — Z7902 Long term (current) use of antithrombotics/antiplatelets: Secondary | ICD-10-CM | POA: Diagnosis not present

## 2015-04-06 DIAGNOSIS — Z79899 Other long term (current) drug therapy: Secondary | ICD-10-CM | POA: Insufficient documentation

## 2015-04-06 DIAGNOSIS — I255 Ischemic cardiomyopathy: Secondary | ICD-10-CM | POA: Diagnosis not present

## 2015-04-06 DIAGNOSIS — Q231 Congenital insufficiency of aortic valve: Secondary | ICD-10-CM | POA: Insufficient documentation

## 2015-04-06 DIAGNOSIS — D696 Thrombocytopenia, unspecified: Secondary | ICD-10-CM | POA: Insufficient documentation

## 2015-04-06 DIAGNOSIS — K7460 Unspecified cirrhosis of liver: Secondary | ICD-10-CM | POA: Diagnosis not present

## 2015-04-06 DIAGNOSIS — E875 Hyperkalemia: Secondary | ICD-10-CM | POA: Insufficient documentation

## 2015-04-06 DIAGNOSIS — Z8674 Personal history of sudden cardiac arrest: Secondary | ICD-10-CM | POA: Diagnosis not present

## 2015-04-06 DIAGNOSIS — N189 Chronic kidney disease, unspecified: Secondary | ICD-10-CM | POA: Insufficient documentation

## 2015-04-06 DIAGNOSIS — I2510 Atherosclerotic heart disease of native coronary artery without angina pectoris: Secondary | ICD-10-CM | POA: Diagnosis not present

## 2015-04-06 DIAGNOSIS — R161 Splenomegaly, not elsewhere classified: Secondary | ICD-10-CM | POA: Insufficient documentation

## 2015-04-06 DIAGNOSIS — R0989 Other specified symptoms and signs involving the circulatory and respiratory systems: Secondary | ICD-10-CM | POA: Insufficient documentation

## 2015-04-06 LAB — BASIC METABOLIC PANEL
Anion gap: 8 (ref 5–15)
BUN: 51 mg/dL — ABNORMAL HIGH (ref 6–23)
CALCIUM: 8.8 mg/dL (ref 8.4–10.5)
CO2: 23 mmol/L (ref 19–32)
CREATININE: 1.29 mg/dL (ref 0.50–1.35)
Chloride: 106 mmol/L (ref 96–112)
GFR calc Af Amer: 72 mL/min — ABNORMAL LOW (ref >90)
GFR calc non Af Amer: 62 mL/min — ABNORMAL LOW (ref >90)
GLUCOSE: 110 mg/dL — AB (ref 70–99)
Potassium: 5.2 mmol/L — ABNORMAL HIGH (ref 3.5–5.1)
Sodium: 137 mmol/L (ref 135–145)

## 2015-04-06 LAB — CBC
HEMATOCRIT: 32.9 % — AB (ref 39.0–52.0)
Hemoglobin: 11.2 g/dL — ABNORMAL LOW (ref 13.0–17.0)
MCH: 30.3 pg (ref 26.0–34.0)
MCHC: 34 g/dL (ref 30.0–36.0)
MCV: 88.9 fL (ref 78.0–100.0)
Platelets: 47 10*3/uL — ABNORMAL LOW (ref 150–400)
RBC: 3.7 MIL/uL — ABNORMAL LOW (ref 4.22–5.81)
RDW: 13 % (ref 11.5–15.5)
WBC: 5 10*3/uL (ref 4.0–10.5)

## 2015-04-06 LAB — GLUCOSE, CAPILLARY
GLUCOSE-CAPILLARY: 72 mg/dL (ref 70–99)
Glucose-Capillary: 135 mg/dL — ABNORMAL HIGH (ref 70–99)

## 2015-04-06 MED ORDER — CARVEDILOL 12.5 MG PO TABS: 18.7500 mg | ORAL_TABLET | Freq: Two times a day (BID) | ORAL | Status: DC

## 2015-04-06 NOTE — Progress Notes (Addendum)
Patient ID: [name], male   DOB: August 23, 1961, 54 y.o.   MRN: [id number] PCP: Dr. [name]  54 yo with history of CAD s/p recent PCI, cirrhosis with thrombocytopenia, cardiac arrest, and ischemic cardiomyopathy presents for cardiology followup.  Patient was admitted in 7/15-8/15 with gallstone pancreatitis.  It was decided not to do a cholecystectomy due to high surgical risk.  He was sent home and went back to work.  In 10/15, he had a cardiac arrest at work and was defibrillated by AED.  He was cooled and recovered well.  Platelets were initially very low but recovered to his baseline in the 60K range.  Cardiac cath was done showing severe 2 vessel disease.  Cardiac MRI was done, showing EF 40% with a mixed viability picture.  With recovery of platelets, he had Xience DES to the LAD and the distal RCA.  He had acute on chronic systolic CHF with volume overload and was diuresed and eventually discharge.  Unfortunately, he was sent home on daily metolazone and developed AKI.  Diuretics were held and this has gradually improved.  Spironolactone and lisinopril have been stopped due to hyperkalemia.     He returns for follow up. Generally doing well.  No significant exertional dyspnea.  He does tire easily when trying to lift heavy weight.  No chest pain.  No lightheadedness/syncope.  No palpitations. Weight is stable.  He is going to cardiac rehab.  Echo was done in 1/16, showing EF 35-40%, stable.  Blood glucose still difficult to control, followed by endocrinology. Some difficulty with balance.   Labs (11/15): K 4.7 => 7.2, creatinine 1.37 => 2.1 => 2.25 with BUN 94, HCT 26.9, plts 67, P2Y12 241 Labs (11/20/14): K 6.2, creatinine 1.1, HCT 33.2, plts 45 Labs (12/18/14): K 6.1 creatinine 1.07 off spiro and lisinopril.  Labs (1/16): K 5.2, creatinine 1.12 Labs (2/16): LDL 59, HDL 41, HCT 32.4 Labs (3/16): K 5.4, creatinine 1.27  PMH: 1. 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  2.  Thrombocytopenia: Chronic.  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   3. Gallstone pancreatitis: He did not have cholecystectomy as deemed too high a surgical risk.  4. CKD 5. Type II diabetes 6. Bicuspid aortic valve: With no AS and mild AI on echo in 1/16.   7. H/o perforated diverticulum.  8. CAD: Cardiac arrest 10/15.  LHC (10/15) with 80-90% proximal ramus stenosis, 90% mLAD, subtotaled distal LAD, 90% D1, 90% dRCA/proximal PDA.  Patient had Xience DES to mLAD, dRCA.  9. Cardiac arrest: 10/15, defibrillated with AED and cooled.  10. Ischemic cardiomyopathy: Echo (8/15) with EF 35-40%, bicuspid aortic valve with mild AS/AI.  Cardiac MRI (10/15) with EF 40%, regional wall motion abnormalities, normal RV, delayed enhancement pattern suggesting mixed picture of viability. Echo (1/16) with EF 35-40%, hypokinesis of the mid to apical septum and the true apex, bicuspid aortic valve, mild AI.   SH: Never smoked.  Prior ETOH abuse, now stopped.  Works at [employer] and [employer].  Lives with sister in [place].   FH: No premature CAD.   ROS: All systems reviewed and negative except as per HPI.   Current Outpatient Prescriptions  Medication Sig Dispense Refill  . aspirin 81 MG chewable tablet Chew 1 tablet (81 mg total) by mouth daily.    [name] atorvastatin (LIPITOR) 20 MG tablet Take 1 tablet (20 mg total) by mouth daily at 6 PM. 30 tablet 3  . carvedilol (COREG) 12.5 MG tablet Take  1.5 tablets (18.75 mg total) by mouth 2 (two) times daily with a meal. 90 tablet 3  . clopidogrel (PLAVIX) 75 MG tablet Take 1 tablet (75 mg total) by mouth daily. 30 tablet 3  . furosemide (LASIX) 40 MG tablet 1-2 tabs po bid prn, as directed by MD 60 tablet 6  . glucose blood (FREESTYLE TEST STRIPS) test strip Use as instructed 100 each 6  . hydrALAZINE (APRESOLINE) 25 MG tablet Take 1 tablet (25 mg total) by mouth 3 (three) times daily. 90 tablet 3  . insulin aspart (NOVOLOG) 100 UNIT/ML injection Inject  0-10 Units into the skin 3 (three) times daily before meals. Sliding scale    . Insulin Degludec (TRESIBA FLEXTOUCH) 100 UNIT/ML SOPN Inject 18 Units into the skin at bedtime. 5 pen 2  . insulin glargine (LANTUS) 100 UNIT/ML injection Inject 0.18 mLs (18 Units total) into the skin daily. Patient advised to take insulin in the morning after working nights;  If working first or second shifts, takes insulin at bedtime 10 mL 11  . Insulin Pen Needle 32G X 6 MM MISC Use for insulin injections 4 times per day 150 each 12  . isosorbide mononitrate (IMDUR) 30 MG 24 hr tablet Take 1 tablet (30 mg total) by mouth daily. 30 tablet 3  . lidocaine (LIDODERM) 5 % Place 1 patch onto the skin daily. Remove & Discard patch within 12 hours or as directed by MD (Patient not taking: Reported on 04/06/2015) 30 patch 0  . nitroGLYCERIN (NITROSTAT) 0.4 MG SL tablet Place 1 tablet (0.4 mg total) under the tongue every 5 (five) minutes as needed for chest pain. (Patient not taking: Reported on 04/06/2015) 25 tablet 0   No current facility-administered medications for this encounter.   BP 130/78 mmHg  Pulse 65  Wt 228 lb 12 oz (103.76 kg)  SpO2 99% General: NAD Neck: No JVD, no thyromegaly or thyroid nodule.  Lungs: Clear   CV: Nondisplaced PMI.  Heart regular S1/S2, no S3/S4, 2/6 early SEM RUSB.  No peripheral edema.  Left carotid bruit.  Normal pedal pulses.  Abdomen: Soft, nontender, no hepatosplenomegaly, no distention.  Skin: Intact without lesions or rashes.  Neurologic: Alert and oriented x 3.  Psych: Normal affect. Extremities: No clubbing or cyanosis.  HEENT: Normal.   Assessment/Plan: 1. Chronic systolic CHF: EF 40/98/1191 40% by cardiac MRI and 35-40% by 1/16 echo, ischemic cardiomyopathy.  He is not volume overloaded on exam and has not been taking Lasix.  NYHA class II symptoms. - Continue lasix  40 mg daily only for weight of 225 or greater - Not on ACEI or spironolactone due to hyperkalemia. Check  BMET today - Continue hydralazine/Imdur.   - Increase Coreg to 18.75 mg bid.  - He can resume driving.  2. CAD: Status post Xience DES to LAD and RCA after cardiac arrest.  Will need to follow closely due to bleeding risk on DAPT with chronic thrombocytopenia.   He is currently on both ASA 81 and Plavix as P2Y12 test has not shown robust platelet inhibition with Plavix (most recently 241 PRU).  No evidence for bleeding.  - Continue statin - Continue ASA 81 and Plavix.  - Continue cardiac rehab.  3. Cirrhosis: Likely related to ETOH.  Has associated splenomegaly and chronic thrombocytopenia.  4. Thrombocytopenia: Chronic, likely due to splenomegaly. Will need to follow plts/hemoglobin over time closely. CBC today.  5. DM: Per endocrinology.  6. Hyperkalemia: Off ACEI and spironolactone.  Check BMET  today.  7. Carotid bruit: On left, check carotid dopplers.   Followup in 3 months.   Marca Ancona 04/06/2015

## 2015-04-06 NOTE — Progress Notes (Signed)
Pt post exercise CBG-72.  Pt asymptomatic. However slight blood tinged nasal drip  noticed. Pt able to stop with applying light pressure momentarily.  Pt has appt today with Dr. Shirlee Latch. Rehab report sent with patient to take to this appt.

## 2015-04-06 NOTE — Patient Instructions (Signed)
Increase Carvedilol to 18.75 mg (1 and 1/2 tabs) Twice daily  Labs today  Your physician has requested that you have a carotid duplex. This test is an ultrasound of the carotid arteries in your neck. It looks at blood flow through these arteries that supply the brain with blood. Allow one hour for this exam. There are no restrictions or special instructions.  Your physician has requested that you have an echocardiogram. Echocardiography is a painless test that uses sound waves to create images of your heart. It provides your doctor with information about the size and shape of your heart and how well your heart's chambers and valves are working. This procedure takes approximately one hour. There are no restrictions for this procedure.  We will contact you in 3 months to schedule your next appointment.

## 2015-04-06 NOTE — Addendum Note (Signed)
Encounter addended by: Laurey Morale, MD on: 04/06/2015 10:14 PM<BR>     Documentation filed: Notes Section

## 2015-04-08 ENCOUNTER — Encounter (HOSPITAL_COMMUNITY)
Admission: RE | Admit: 2015-04-08 | Discharge: 2015-04-08 | Disposition: A | Discharge status: Home / Self Care | Payer: 59 | Source: Ambulatory Visit | Attending: Cardiology | Admitting: Cardiology

## 2015-04-08 DIAGNOSIS — I509 Heart failure, unspecified: Secondary | ICD-10-CM | POA: Diagnosis not present

## 2015-04-08 LAB — GLUCOSE, CAPILLARY: Glucose-Capillary: 208 mg/dL — ABNORMAL HIGH (ref 70–99)

## 2015-04-08 NOTE — Progress Notes (Signed)
Pt arrived at cardiac rehab reporting increase Coreg 18.75 mg BID per Dr Shirlee Latch.  Medication list reconciled.

## 2015-04-10 ENCOUNTER — Ambulatory Visit (HOSPITAL_COMMUNITY): Payer: 59 | Attending: Cardiology | Admitting: Radiology

## 2015-04-10 ENCOUNTER — Ambulatory Visit (HOSPITAL_BASED_OUTPATIENT_CLINIC_OR_DEPARTMENT_OTHER): Payer: 59 | Admitting: Cardiology

## 2015-04-10 ENCOUNTER — Other Ambulatory Visit (HOSPITAL_COMMUNITY): Payer: Self-pay | Admitting: Cardiology

## 2015-04-10 ENCOUNTER — Other Ambulatory Visit (HOSPITAL_COMMUNITY): Payer: Self-pay | Admitting: Radiology

## 2015-04-10 ENCOUNTER — Encounter (HOSPITAL_COMMUNITY)
Admission: RE | Admit: 2015-04-10 | Discharge: 2015-04-10 | Disposition: A | Discharge status: Home / Self Care | Payer: 59 | Source: Ambulatory Visit | Attending: Cardiology | Admitting: Cardiology

## 2015-04-10 DIAGNOSIS — I10 Essential (primary) hypertension: Secondary | ICD-10-CM | POA: Insufficient documentation

## 2015-04-10 DIAGNOSIS — I255 Ischemic cardiomyopathy: Secondary | ICD-10-CM

## 2015-04-10 DIAGNOSIS — E669 Obesity, unspecified: Secondary | ICD-10-CM | POA: Insufficient documentation

## 2015-04-10 DIAGNOSIS — I6523 Occlusion and stenosis of bilateral carotid arteries: Secondary | ICD-10-CM

## 2015-04-10 DIAGNOSIS — R0989 Other specified symptoms and signs involving the circulatory and respiratory systems: Secondary | ICD-10-CM

## 2015-04-10 DIAGNOSIS — I509 Heart failure, unspecified: Secondary | ICD-10-CM | POA: Diagnosis not present

## 2015-04-10 DIAGNOSIS — Z794 Long term (current) use of insulin: Secondary | ICD-10-CM | POA: Diagnosis not present

## 2015-04-10 DIAGNOSIS — E119 Type 2 diabetes mellitus without complications: Secondary | ICD-10-CM | POA: Diagnosis not present

## 2015-04-10 DIAGNOSIS — I5022 Chronic systolic (congestive) heart failure: Secondary | ICD-10-CM

## 2015-04-10 HISTORY — PX: OTHER SURGICAL HISTORY: SHX169

## 2015-04-10 LAB — GLUCOSE, CAPILLARY: GLUCOSE-CAPILLARY: 111 mg/dL — AB (ref 70–99)

## 2015-04-10 MED ORDER — PERFLUTREN LIPID MICROSPHERE
3.0000 mL | Freq: Once | INTRAVENOUS | Status: AC
  Administered 2015-04-10: 3 mL via INTRAVENOUS

## 2015-04-10 MED ORDER — PERFLUTREN LIPID MICROSPHERE: 3.0000 mL | Freq: Once | INTRAVENOUS | Status: DC

## 2015-04-10 NOTE — Progress Notes (Signed)
Carotid duplex performed 

## 2015-04-10 NOTE — Addendum Note (Signed)
Addended by: Oren Bracket on: 04/10/2015 03:00 PM   Modules accepted: Orders

## 2015-04-10 NOTE — Progress Notes (Signed)
Echocardiogram performed with Definity.  

## 2015-04-13 ENCOUNTER — Encounter (HOSPITAL_COMMUNITY)
Admission: RE | Admit: 2015-04-13 | Discharge: 2015-04-13 | Disposition: A | Discharge status: Home / Self Care | Payer: 59 | Source: Ambulatory Visit | Attending: Cardiology | Admitting: Cardiology

## 2015-04-13 ENCOUNTER — Telehealth (HOSPITAL_COMMUNITY): Payer: Self-pay | Admitting: *Deleted

## 2015-04-13 DIAGNOSIS — I509 Heart failure, unspecified: Secondary | ICD-10-CM | POA: Diagnosis not present

## 2015-04-13 LAB — GLUCOSE, CAPILLARY
Glucose-Capillary: 151 mg/dL — ABNORMAL HIGH (ref 70–99)
Glucose-Capillary: 141 mg/dL — ABNORMAL HIGH (ref 70–99)
Glucose-Capillary: 142 mg/dL — ABNORMAL HIGH (ref 70–99)
Glucose-Capillary: 63 mg/dL — ABNORMAL LOW (ref 70–99)

## 2015-04-13 MED FILL — Glucose Gel 40%: ORAL | Qty: 1 | Status: AC

## 2015-04-13 NOTE — Telephone Encounter (Signed)
Called pt with results from echo and carotid u/s

## 2015-04-13 NOTE — Progress Notes (Signed)
Pt arrived at cardiac rehab with skin tear that occurred at home yesterday. Pt is concerned because it has been oozing since. bandaid removed, approximately 1 cm skin tear present on left forearm, slow bloody oozing.  Cleaned with peroxide, firm pressure applied with gauze pads.  Oozing stopped.  telfa dressing covered with gauze and paper tape applied.  Pt able to exercise without recurrence of bleeding.  Pt instructed to avoid weight lifting today.   Post exercise CBG-63.  Pt c/o diaphoresis and visual changes.   Pt given gingerale and glucose gel.  15 minute recheck:  142.  Preexercise CBG-141.  Pt reports he ate oatmeal, apple with peanut butter at 6:30am then waited until 7:30am to take insulin 6 units.  Pt instructed if fasting AM CBG <150 on exercise day to hold AM Insulin, eat usual breakfast and bring insulin with him to class. Pt verbalized understanding.

## 2015-04-15 ENCOUNTER — Encounter (HOSPITAL_COMMUNITY)
Admission: RE | Admit: 2015-04-15 | Discharge: 2015-04-15 | Disposition: A | Discharge status: Home / Self Care | Payer: 59 | Source: Ambulatory Visit | Attending: Cardiology | Admitting: Cardiology

## 2015-04-15 DIAGNOSIS — I509 Heart failure, unspecified: Secondary | ICD-10-CM | POA: Diagnosis not present

## 2015-04-15 LAB — GLUCOSE, CAPILLARY: Glucose-Capillary: 176 mg/dL — ABNORMAL HIGH (ref 70–99)

## 2015-04-15 NOTE — Progress Notes (Signed)
Upon arrival to cardiac rehab pt had episode of nausea and vomiting.  Otherwise asymptomatic. Pt felt relief of nausea after vomiting. Pt wearing telemetry monitor at time of episode with no EKG abnormalities identified.  BP-116/70.  CBG-175.  Pt did not exercise.  Dr. Alford Highland office notified.  Pt advised to take medications as prescribed follow sick day instructions for insulin administration. Pt also instructed to eat bland, nonspicy, non fatty foods today.  Pt verbalized understanding.   Pt plans to be absent next two scheduled sessions for vacation.

## 2015-04-16 ENCOUNTER — Telehealth (HOSPITAL_COMMUNITY): Payer: Self-pay | Admitting: *Deleted

## 2015-04-16 NOTE — Telephone Encounter (Signed)
Received mess from cardiac rehab that pt had n/v yesterday at his session there.  I called pt to check on him today, he states he just threw up that 1 time yesterday and then just felt weak, he states today he feels fine and he has eaten and is normal today.  He will let us know if it happens again.

## 2015-04-17 ENCOUNTER — Encounter (HOSPITAL_COMMUNITY): Payer: 59

## 2015-04-20 ENCOUNTER — Encounter (HOSPITAL_COMMUNITY): Payer: 59

## 2015-04-20 ENCOUNTER — Other Ambulatory Visit: Payer: Self-pay | Admitting: Family Medicine

## 2015-04-20 MED ORDER — ATORVASTATIN CALCIUM 20 MG PO TABS: 20.0000 mg | ORAL_TABLET | Freq: Every day | ORAL | Status: DC

## 2015-04-21 ENCOUNTER — Encounter: Payer: Self-pay | Admitting: Family Medicine

## 2015-04-21 ENCOUNTER — Ambulatory Visit (INDEPENDENT_AMBULATORY_CARE_PROVIDER_SITE_OTHER): Payer: 59 | Admitting: Family Medicine

## 2015-04-21 VITALS — BP 132/82 | HR 66 | Temp 97.7°F | Resp 16 | Wt 230.0 lb

## 2015-04-21 DIAGNOSIS — E875 Hyperkalemia: Secondary | ICD-10-CM

## 2015-04-21 DIAGNOSIS — I10 Essential (primary) hypertension: Secondary | ICD-10-CM

## 2015-04-21 DIAGNOSIS — N189 Chronic kidney disease, unspecified: Secondary | ICD-10-CM

## 2015-04-21 DIAGNOSIS — K7581 Nonalcoholic steatohepatitis (NASH): Secondary | ICD-10-CM

## 2015-04-21 DIAGNOSIS — Z1211 Encounter for screening for malignant neoplasm of colon: Secondary | ICD-10-CM

## 2015-04-21 DIAGNOSIS — K7460 Unspecified cirrhosis of liver: Secondary | ICD-10-CM

## 2015-04-21 DIAGNOSIS — N182 Chronic kidney disease, stage 2 (mild): Secondary | ICD-10-CM

## 2015-04-21 DIAGNOSIS — I5022 Chronic systolic (congestive) heart failure: Secondary | ICD-10-CM | POA: Diagnosis not present

## 2015-04-21 DIAGNOSIS — I2510 Atherosclerotic heart disease of native coronary artery without angina pectoris: Secondary | ICD-10-CM

## 2015-04-21 NOTE — Progress Notes (Signed)
Pre visit review using our clinic review tool, if applicable. No additional management support is needed unless otherwise documented below in the visit note. 

## 2015-04-21 NOTE — Progress Notes (Signed)
OFFICE VISIT  05/01/2015   CC:  Chief Complaint  Patient presents with  . Follow-up   HPI:    Patient is a [name] who presents for 2 mo f/u NASH, CAD with ischemic cardiomyopathy, CRI stage 2. His DM 2 is now followed by endo (Dr. Elvera Lennox).  He has hx of hyperkalemia and therefore cannot be on an ACE or ARB. Still doing cardiac rehab but is not released to go to work yet b/c still feels off balance when he gets up from kneeling position: has decreased sensation due to DPN in both feet, also still feels somewhat weak in both legs.  Most recent repeat echo showed EF 40%-45%-improved.    His home scale shows wt 223-226 consistently, says he is following Na and fluid restrictions well. Currently taking no lasix b/c wt/fluid status has been good.    Says left shoulder injection I did 2 mo ago brought complete relief for 3 wks or so. It hurts now only when he uses left arm to push up off the floor.     Past Medical History  Diagnosis Date  . Heart murmur     ECHO 07/2011 showed mild AS and LVH.  TEE 09/2011 showed small PFO, bicuspid aortic valve but no stenosis or regurg.  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  Gen surg and GI recommended flagyl and cipro x 2 wks..  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  . Cholelithiasis     u/s--no cholecystitis.  06/2014 u/s showed no stones, only GB sludge  . Peripheral neuropathy     Diabetic:  Autonomic (pelvic) and LE polyneuropathy--[facility] testing showed that it is likely from DM  . Diabetic foot ulcers     Poor healing; normal ABIs and TBIs  . Arthritis     back L3 - L5  . Hyperactive gag reflex   . History of pyelonephritis   . Type II or unspecified type diabetes mellitus with neurological manifestations, not stated as uncontrolled     IDDM: neuro, renal, and  ophth complications  . Wears dentures     upper  . Osteomyelitis of toe of left foot     left 4th and 5th toes--now s/p amputation of these areas  . Diabetic retinopathy associated with type 2 diabetes mellitus     Laser tx in both eyes (Dr. Allyne Gee)  . Diabetic nephropathy     Proteinuria and CrCl 55-65  . Chronic renal insufficiency, stage III (moderate)     CrCl 50s-80s 2014 to pre-VFIB arrest 10/02/14, at which time he sustained AKI.  . Lumbar spondylosis     MRI 08/25/11 with multilevel facet dz, small disc protrusion at L5-S1 to the right without impingement  . Ischemic cardiomyopathy 07/22/14    Myoview w/out ischemia but large scar (inferior/apex) w/ global hypokinesis--medical mgmt  . History of pancreatitis 2014 and 2015    Suspected biliary: passing small gallstones: surgery declined to remove GB 07/2014.  . Aspiration pneumonia   . Thrombocytopenia   . Portal hypertension   . Liver cirrhosis secondary to NASH     ?+ alcohol?.  With hx of splenomegaly and ascities (dx approx 2012)  . History of gram negative sepsis 2015    e coli  . Ventricular fibrillation 10/02/14    Resuscitated, hospitalized, 3 V CAD detected,  DES stent to mid LAD and to RCA were placed.    Past Surgical History  Procedure Laterality Date  . Knee arthroscopy  2001    Bilateral  . Amputation  10/24/2012    Procedure: AMPUTATION RAY;  Surgeon: Sherri Rad, MD;  Location: [facility];  Service: Orthopedics;  Laterality: Left;  left 4th toe amputation through MTP joint, 5th Ray amputation   . Knee surgery  2001  . Hida scan  06/2014    Normal  . Transthoracic echocardiogram  07/19/14;01/07/15    Mild LV dilation and hypertrophy, EF 35-40%, no wall motion abnormalities, grade II diast dysfxn, bicuspid aortic valve with mild AS and mild AI.  No change on 12/2014 echo.  . Cardiovascular stress test  07/2014    Myoview w/out ischemia but large scar (inferior/apex) w/ global hypokinesis--medical  mgmt  . Eye surgery  08/2014    Retinal hemorrhage evacuation  . Coronary angioplasty with stent placement  09/2014    DES to mid LAD and RCA; EF 40% by cardiac MRI  . Cardiac mri  09/2014    Moderately dilated LV.  EF 40%, multiple wall motion abnormalities, normal RV fxn.  Some areas of scarring.  . Left heart catheterization with coronary angiogram N/A 10/10/2014    Procedure: LEFT HEART CATHETERIZATION WITH CORONARY ANGIOGRAM;  Surgeon: Peter M Swaziland, MD;  Location: [facility] CATH [facility];  Service: Cardiovascular;  Laterality: N/A;  . Left heart catheterization with coronary angiogram N/A 10/15/2014    Procedure: LEFT HEART CATHETERIZATION WITH CORONARY ANGIOGRAM;  Surgeon: Iran Ouch, MD;  Location: [facility] CATH LAB;  Service: Cardiovascular;  Laterality: N/A;  . Carotid dopplers  04/10/15    1-35% bilat; normal subclavians   MEDS: pt not taking lantus listed below Outpatient Prescriptions Prior to Visit  Medication Sig Dispense Refill  . aspirin 81 MG chewable tablet Chew 1 tablet (81 mg total) by mouth daily.    [name] atorvastatin (LIPITOR) 20 MG tablet Take 1 tablet (20 mg total) by mouth daily at 6 PM. 30 tablet 3  . carvedilol (COREG) 12.5 MG tablet Take 1.5 tablets (18.75 mg total) by mouth 2 (two) times daily with a meal. 90 tablet 3  . clopidogrel (PLAVIX) 75 MG tablet Take 1 tablet (75 mg total) by mouth daily. 30 tablet 3  . glucose blood (FREESTYLE TEST STRIPS) test strip Use as instructed 100 each 6  . hydrALAZINE (APRESOLINE) 25 MG tablet Take 1 tablet (25 mg total) by mouth 3 (three) times daily. 90 tablet 3  . insulin aspart (NOVOLOG) 100 UNIT/ML injection Inject 0-10 Units into the skin 3 (three) times daily before meals. Sliding scale    . Insulin Degludec (TRESIBA FLEXTOUCH) 100 UNIT/ML SOPN Inject 18 Units into the skin at bedtime. 5 pen 2  . Insulin Pen Needle 32G X 6 MM MISC Use for insulin injections 4 times per day 150 each 12  . isosorbide mononitrate (IMDUR) 30 MG 24 hr  tablet Take 1 tablet (30 mg total) by mouth daily. 30 tablet 3  . lidocaine (LIDODERM) 5 % Place 1 patch onto the skin daily. Remove & Discard patch within 12 hours or as directed by MD 30 patch 0  . nitroGLYCERIN (NITROSTAT) 0.4 MG SL tablet Place 1 tablet (0.4 mg total) under the tongue every 5 (five) minutes as needed for chest pain. 25 tablet 0  . furosemide (LASIX) 40 MG tablet 1-2 tabs po bid prn, as directed by MD 60 tablet  6  . insulin glargine (LANTUS) 100 UNIT/ML injection Inject 0.18 mLs (18 Units total) into the skin daily. Patient advised to take insulin in the morning after working nights;  If working first or second shifts, takes insulin at bedtime (Patient not taking: Reported on 04/21/2015) 10 mL 11   No facility-administered medications prior to visit.    Allergies  Allergen Reactions  . Iodine Other (See Comments)    Family history of anaphylaxis, unsure if patient has reaction  . Other Shortness Of Breath, Itching and Other (See Comments)    PERFUMES - SNEEZING  . Hydralazine Hcl Hives  . Naproxen Nausea And Vomiting    ROS As per HPI  PE: Blood pressure 132/82, pulse 66, temperature 97.7 F (36.5 C), temperature source Temporal, resp. rate 16, weight 230 lb (104.327 kg), SpO2 99 %. Wt down 3 lb from last visit. Gen: Alert, well appearing.  Patient is oriented to person, place, time, and situation. ZOX:WRUE: no injection, icteris, swelling, or exudate.  EOMI, PERRLA. Mouth: lips without lesion/swelling.  Oral mucosa pink and moist. Oropharynx without erythema, exudate, or swelling.  CV: RRR, 2/6 syst murmur heard best at Aortic valve region, radiates up into upper chest, R>L. Chest is clear, no wheezing or rales. Normal symmetric air entry throughout both lung fields. No chest wall deformities or tenderness. ABD: soft, rotund, nontender, BS normal.  No HSM or bruit or mass.  No pitting edema.   EXT; 1+ pitting on R LL, 3+ pitting on L LL.  LABS:  Lab Results   Component Value Date   WBC 5.0 04/06/2015   HGB 11.2* 04/06/2015   HCT 32.9* 04/06/2015   MCV 88.9 04/06/2015   PLT 47* 04/06/2015     Chemistry      Component Value Date/Time   NA 139 04/30/2015 0950   K 5.2* 04/30/2015 0950   CL 105 04/30/2015 0950   CO2 28 04/30/2015 0950   BUN 38* 04/30/2015 0950   CREATININE 1.23 04/30/2015 0950   CREATININE 1.27 02/20/2015 1006      Component Value Date/Time   CALCIUM 8.6* 04/30/2015 0950   ALKPHOS 165* 12/18/2014 1016   AST 32 12/18/2014 1016   ALT 27 12/18/2014 1016   BILITOT 0.7 12/18/2014 1016     Lab Results  Component Value Date   CHOL 132 02/09/2015   HDL 41 02/09/2015   LDLCALC 59 02/09/2015   TRIG 160* 02/09/2015   CHOLHDL 3.2 02/09/2015   Lab Results  Component Value Date   TSH 1.900 07/19/2014   IMPRESSION AND PLAN:  1) HTN; The current medical regimen is effective;  continue present plan and medications.  2) Liver cirrhosis secondary to NASH: hx of portal HTN and ascites but none currently. Holding lasix at this time.  Monitor wt.  3) Chronic systolic HF/Ischemic CM: continue coreg 12.5mg  tabs, 1 and 1/2 tabs po bid.  No sign of fluid overload. Continue ASA + plavix + statin for CAD s/p stent placement.  Continue cardiac rehab.    4) CRI, stage II.  Creatinine stable 2 wks ago. Hx of hyperkalemia, therefore pt unable to take ACE-I or ARB or aldactone.  5) Colon cancer screening: he has had a flex sig for diagnostic purposes 12/01/2011.  ? Double contrast BE an option?  Or does he need colonoscopy.  Will arrange f/u appt with his GI MD, Dr. Juanda Chance, to see what she recommends.  An After Visit Summary was printed and given to the patient.  FOLLOW UP: Return in about 4 months (around 08/22/2015) for 30 min f/u visit.

## 2015-04-22 ENCOUNTER — Ambulatory Visit (HOSPITAL_COMMUNITY)
Admission: RE | Admit: 2015-04-22 | Discharge: 2015-04-22 | Disposition: A | Discharge status: Home / Self Care | Payer: 59 | Source: Ambulatory Visit | Attending: Cardiology | Admitting: Cardiology

## 2015-04-22 ENCOUNTER — Telehealth (HOSPITAL_COMMUNITY): Payer: Self-pay | Admitting: Surgery

## 2015-04-22 ENCOUNTER — Other Ambulatory Visit: Payer: Self-pay | Admitting: *Deleted

## 2015-04-22 ENCOUNTER — Encounter (HOSPITAL_COMMUNITY)
Admission: RE | Admit: 2015-04-22 | Discharge: 2015-04-22 | Disposition: A | Discharge status: Home / Self Care | Payer: 59 | Source: Ambulatory Visit | Attending: Cardiology | Admitting: Cardiology

## 2015-04-22 DIAGNOSIS — Z79899 Other long term (current) drug therapy: Secondary | ICD-10-CM | POA: Diagnosis not present

## 2015-04-22 DIAGNOSIS — R040 Epistaxis: Secondary | ICD-10-CM | POA: Insufficient documentation

## 2015-04-22 DIAGNOSIS — I252 Old myocardial infarction: Secondary | ICD-10-CM | POA: Insufficient documentation

## 2015-04-22 DIAGNOSIS — I509 Heart failure, unspecified: Secondary | ICD-10-CM | POA: Diagnosis not present

## 2015-04-22 DIAGNOSIS — Z1211 Encounter for screening for malignant neoplasm of colon: Secondary | ICD-10-CM

## 2015-04-22 NOTE — Telephone Encounter (Signed)
Received a call from Cardiac Rehab concerning [name] has arrived today again with nausea and just not "feeling well".  He also complains of bloating and burping.  He did see PCP Dr. Lynford Humphrey yesterday who plans to refer him to Dr. Dickie La with GI.  Cardiac rehab will not have him exercise again today.

## 2015-04-22 NOTE — Progress Notes (Signed)
Pt arrived at cardiac rehab today c/o nausea, burping indigestion and abdominal bloating.  No vomiting. Similar symptoms to previous cardiac rehab visit, however that episode was associated with vomiting.  Pt reports only recent dietary change is addition of natural peanut butter and coffee with cocoa over past week. Pt did not exercise.  Pt was seen yesterday by Dr. [name] and is awaiting referral appointment to be seen by Dr. Juanda Chance.  pc to Dr. Samul Dada office to obtain date/time of this appointment.    Dr. Alford Highland nurse Bard Herbert informed of pt symptoms today. 12 lead EKG obtained.  No change from previous tracing.  BP: 136/61.  Pt instructed to follow low fat, low sodium diet and follow usual fluid restriction.  Will await Dr. Shirlee Latch recommendation and gastroenterology referral.

## 2015-04-24 ENCOUNTER — Encounter (HOSPITAL_COMMUNITY)
Admission: RE | Admit: 2015-04-24 | Discharge: 2015-04-24 | Disposition: A | Discharge status: Home / Self Care | Payer: 59 | Source: Ambulatory Visit | Attending: Cardiology | Admitting: Cardiology

## 2015-04-24 DIAGNOSIS — I509 Heart failure, unspecified: Secondary | ICD-10-CM | POA: Diagnosis not present

## 2015-04-24 LAB — GLUCOSE, CAPILLARY
Glucose-Capillary: 142 mg/dL — ABNORMAL HIGH (ref 70–99)
Glucose-Capillary: 101 mg/dL — ABNORMAL HIGH (ref 70–99)

## 2015-04-24 NOTE — Progress Notes (Addendum)
Pt returned to cardiac rehab today c/o mild nausea pre exercise. Pt able to exercise without difficulty with resolution of symptoms.  Pt reports he has eaten breakfast this am, however held his insulin.  Post exercise CBG-101.  Pt reports he has heard from Dr. Samul Dada office re: gastroenterology referral with voicemessage to return call to schedule the appointment. Pt plans to do that today.

## 2015-04-27 ENCOUNTER — Telehealth (HOSPITAL_COMMUNITY): Payer: Self-pay | Admitting: *Deleted

## 2015-04-27 ENCOUNTER — Encounter (HOSPITAL_COMMUNITY)
Admission: RE | Admit: 2015-04-27 | Discharge: 2015-04-27 | Disposition: A | Discharge status: Home / Self Care | Payer: 59 | Source: Ambulatory Visit | Attending: Cardiology | Admitting: Cardiology

## 2015-04-27 DIAGNOSIS — I509 Heart failure, unspecified: Secondary | ICD-10-CM | POA: Diagnosis not present

## 2015-04-27 LAB — GLUCOSE, CAPILLARY
GLUCOSE-CAPILLARY: 135 mg/dL — AB (ref 70–99)
Glucose-Capillary: 145 mg/dL — ABNORMAL HIGH (ref 70–99)

## 2015-04-27 NOTE — Telephone Encounter (Signed)
Cardiac Rehab called pts weight is up 6.5lbs since Friday.  Pt is not SOB, no chest pain but has bilateral edema up to shins with abdominal tightness.  Pt is supposed to take 40mg  of Lasix for weight over 225lbs (called to see if he is taking this dose) if he is currently taking 40mg  of lasix he can take an extra 40mg  and keep feet elevated (per megan)  No answer voicemail full. Will try patient again before noon.

## 2015-04-27 NOTE — Progress Notes (Signed)
Pt arrived at cardiac rehab with 3kg weight gain since 04/24/15.  Pt weight 107.3kg today up from 104.2kg on Friday.  Pt c/o abdominal distention and pedal edema.  Denies dyspnea. Also denies nausea today.  Lungs clear, abdomen distended, 1+ pitting edema bilateral lower extremity  to shin. Phone call to [facility], Dr. Alford Highland office.  Leavy Cella will review with Dr. Shirlee Latch and advise patient.

## 2015-04-28 ENCOUNTER — Encounter (HOSPITAL_COMMUNITY): Payer: Self-pay

## 2015-04-28 NOTE — Progress Notes (Signed)
LATE ENTRY Medical record request for date of service by our office on 04/06/15 from [employer] to consider patient for wage replacement benefits was completed and faxed on 04/16/2015.  Copy of request form scanned into patient's chart for future reference. [employer] Case # [id number]  Caryl Comes [facility]

## 2015-04-29 ENCOUNTER — Telehealth (HOSPITAL_COMMUNITY): Payer: Self-pay

## 2015-04-29 ENCOUNTER — Encounter (HOSPITAL_COMMUNITY)
Admission: RE | Admit: 2015-04-29 | Discharge: 2015-04-29 | Disposition: A | Discharge status: Home / Self Care | Payer: 59 | Source: Ambulatory Visit | Attending: Cardiology | Admitting: Cardiology

## 2015-04-29 DIAGNOSIS — I509 Heart failure, unspecified: Secondary | ICD-10-CM | POA: Diagnosis not present

## 2015-04-29 LAB — GLUCOSE, CAPILLARY
Glucose-Capillary: 167 mg/dL — ABNORMAL HIGH (ref 70–99)
Glucose-Capillary: 132 mg/dL — ABNORMAL HIGH (ref 70–99)

## 2015-04-29 NOTE — Progress Notes (Signed)
Pt arrived at cardiac rehab today with 0.4kg weight gain since 04/27/15.  Weight today 107.7kg up from 104.2kg on 04/24/15.  Unfortunately pt was not aware to take lasix 40mg  until late yesterday afternoon.  Pt did take this dose around 4:30pm yesterday afternoon and has taken lasix 40mg  this am.  Lungs clear, pitting pedal edema bilaterally to shins, left >right.  Pt c/o abdominal distention, which is slightly improved since yesterday.  PC to Dr. Alford Highland office. Spoke to [place].  appt scheduled for Pt to be seen by Dr. Gala Romney 04/30/15 @8 :40am. Pt instructed to contact [facility] clinic if symptoms worsen today.  Pt also advised to follow strict low sodium diet and fluid restriction, continue daily weights notify office for weight gain >3 lb one day, >5lb in one week. Understanding verbalized.

## 2015-04-29 NOTE — Telephone Encounter (Signed)
Randa Evens from cardiac rehab called stating patients weight was up to 236 lbs (about 10 from baesline), slight abd edema, BLEE L>R +2, SOB.  Took PRN lasix yesterday and states he had a lot of urinary output and abd distention did go down.  Advised to take another 40mg  lasix today, patient added on to our schedule tomorrow to be seen.  Also advised to call us today if he begins to feel worse, more SOB, or swelling increases.  Patient aware and agreeable.  Ave Filter

## 2015-04-30 ENCOUNTER — Encounter (HOSPITAL_COMMUNITY): Payer: Self-pay

## 2015-04-30 ENCOUNTER — Ambulatory Visit (HOSPITAL_COMMUNITY)
Admission: RE | Admit: 2015-04-30 | Discharge: 2015-04-30 | Disposition: A | Discharge status: Home / Self Care | Payer: 59 | Source: Ambulatory Visit | Attending: Internal Medicine | Admitting: Internal Medicine

## 2015-04-30 ENCOUNTER — Telehealth (HOSPITAL_COMMUNITY): Payer: Self-pay | Admitting: *Deleted

## 2015-04-30 VITALS — BP 152/74 | HR 63 | Wt 233.8 lb

## 2015-04-30 DIAGNOSIS — Z955 Presence of coronary angioplasty implant and graft: Secondary | ICD-10-CM | POA: Insufficient documentation

## 2015-04-30 DIAGNOSIS — E119 Type 2 diabetes mellitus without complications: Secondary | ICD-10-CM | POA: Diagnosis not present

## 2015-04-30 DIAGNOSIS — Z7902 Long term (current) use of antithrombotics/antiplatelets: Secondary | ICD-10-CM | POA: Diagnosis not present

## 2015-04-30 DIAGNOSIS — Z8674 Personal history of sudden cardiac arrest: Secondary | ICD-10-CM | POA: Insufficient documentation

## 2015-04-30 DIAGNOSIS — I255 Ischemic cardiomyopathy: Secondary | ICD-10-CM | POA: Insufficient documentation

## 2015-04-30 DIAGNOSIS — I5022 Chronic systolic (congestive) heart failure: Secondary | ICD-10-CM | POA: Diagnosis not present

## 2015-04-30 DIAGNOSIS — E875 Hyperkalemia: Secondary | ICD-10-CM | POA: Insufficient documentation

## 2015-04-30 DIAGNOSIS — Z794 Long term (current) use of insulin: Secondary | ICD-10-CM | POA: Diagnosis not present

## 2015-04-30 DIAGNOSIS — R0989 Other specified symptoms and signs involving the circulatory and respiratory systems: Secondary | ICD-10-CM | POA: Insufficient documentation

## 2015-04-30 DIAGNOSIS — Q231 Congenital insufficiency of aortic valve: Secondary | ICD-10-CM | POA: Insufficient documentation

## 2015-04-30 DIAGNOSIS — Z7982 Long term (current) use of aspirin: Secondary | ICD-10-CM | POA: Insufficient documentation

## 2015-04-30 DIAGNOSIS — K7460 Unspecified cirrhosis of liver: Secondary | ICD-10-CM | POA: Diagnosis not present

## 2015-04-30 DIAGNOSIS — D696 Thrombocytopenia, unspecified: Secondary | ICD-10-CM | POA: Insufficient documentation

## 2015-04-30 DIAGNOSIS — Z79899 Other long term (current) drug therapy: Secondary | ICD-10-CM | POA: Diagnosis not present

## 2015-04-30 DIAGNOSIS — I2510 Atherosclerotic heart disease of native coronary artery without angina pectoris: Secondary | ICD-10-CM | POA: Diagnosis not present

## 2015-04-30 LAB — BASIC METABOLIC PANEL
Anion gap: 6 (ref 5–15)
BUN: 38 mg/dL — ABNORMAL HIGH (ref 6–20)
CALCIUM: 8.6 mg/dL — AB (ref 8.9–10.3)
CHLORIDE: 105 mmol/L (ref 101–111)
CO2: 28 mmol/L (ref 22–32)
Creatinine, Ser: 1.23 mg/dL (ref 0.61–1.24)
GFR calc Af Amer: >60 mL/min (ref >60)
GFR calc non Af Amer: >60 mL/min (ref >60)
Glucose, Bld: 240 mg/dL — ABNORMAL HIGH (ref 65–99)
Potassium: 5.2 mmol/L — ABNORMAL HIGH (ref 3.5–5.1)
SODIUM: 139 mmol/L (ref 135–145)

## 2015-04-30 LAB — BRAIN NATRIURETIC PEPTIDE: B NATRIURETIC PEPTIDE 5: 351.9 pg/mL — AB (ref 0.0–100.0)

## 2015-04-30 MED ORDER — FUROSEMIDE 40 MG PO TABS: 40.0000 mg | ORAL_TABLET | Freq: Every day | ORAL | Status: DC

## 2015-04-30 NOTE — Patient Instructions (Signed)
Increase Furosemide (Lasix) to 40 mg Twice daily for 2 days, THEN Take 40 mg daily HOLD IF WEIGHT IS 225 LB OR LESS  Labs today  Your physician recommends that you schedule a follow-up appointment in: 2 weeks

## 2015-04-30 NOTE — Progress Notes (Signed)
Patient ID: [name], male   DOB: [birth year], 54 y.o.   MRN: [id number] PCP: Dr. [name]  54 yo with history of CAD s/p recent PCI, cirrhosis with thrombocytopenia, cardiac arrest, and ischemic cardiomyopathy presents for cardiology followup.  Patient was admitted in 7/15-8/15 with gallstone pancreatitis.  It was decided not to do a cholecystectomy due to high surgical risk.  He was sent home and went back to work.  In 10/15, he had a cardiac arrest at work and was defibrillated by AED.  He was cooled and recovered well.  Platelets were initially very low but recovered to his baseline in the 60K range.  Cardiac cath was done showing severe 2 vessel disease.  Cardiac MRI was done, showing EF 40% with a mixed viability picture.  With recovery of platelets, he had Xience DES to the LAD and the distal RCA.  He had acute on chronic systolic CHF with volume overload and was diuresed and eventually discharge.  Unfortunately, he was sent home on daily metolazone and developed AKI.  Diuretics were held and this has gradually improved.  Spironolactone and lisinopril have been stopped due to hyperkalemia.     He returns for follow up for weight gain. Last visit carvedilol was increased to 18.75 mg twice daily. Overall feels good. Denies SOB/PND/Orthopnea. Weight up 7 pounds in 4 days. He noticed weight gain on Monday after he went to eat at [employer]. Has been taking lasix for the 3 days. Weight at home had gone up to 233 but now coming down to 228 pounds. Drinking > 2 liters of fluid.  Taking all medications.    Labs (11/15): K 4.7 => 7.2, creatinine 1.37 => 2.1 => 2.25 with BUN 94, HCT 26.9, plts 67, P2Y12 241 Labs (11/20/14): K 6.2, creatinine 1.1, HCT 33.2, plts 45 Labs (12/18/14): K 6.1 creatinine 1.07 off spiro and lisinopril.  Labs (1/16): K 5.2, creatinine 1.12 Labs (2/16): LDL 59, HDL 41, HCT 32.4 Labs (3/16): K 5.4, creatinine 1.27  PMH: 1. 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  2.  Thrombocytopenia: Chronic.  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   3. Gallstone pancreatitis: He did not have cholecystectomy as deemed too high a surgical risk.  4. CKD 5. Type II diabetes 6. Bicuspid aortic valve: With no AS and mild AI on echo in 1/16.   7. H/o perforated diverticulum.  8. CAD: Cardiac arrest 10/15.  LHC (10/15) with 80-90% proximal ramus stenosis, 90% mLAD, subtotaled distal LAD, 90% D1, 90% dRCA/proximal PDA.  Patient had Xience DES to mLAD, dRCA.  9. Cardiac arrest: 10/15, defibrillated with AED and cooled.  10. Ischemic cardiomyopathy: Echo (8/15) with EF 35-40%, bicuspid aortic valve with mild AS/AI.  Cardiac MRI (10/15) with EF 40%, regional wall motion abnormalities, normal RV, delayed enhancement pattern suggesting mixed picture of viability. Echo (1/16) with EF 35-40%, hypokinesis of the mid to apical septum and the true apex, bicuspid aortic valve, mild AI.   SH: Never smoked.  Prior ETOH abuse, now stopped.  Works at [employer] and [employer].  Lives with sister in [place].   FH: No premature CAD.   ROS: All systems reviewed and negative except as per HPI.   Current Outpatient Prescriptions  Medication Sig Dispense Refill  . aspirin 81 MG chewable tablet Chew 1 tablet (81 mg total) by mouth daily.    [name] atorvastatin (LIPITOR) 20 MG tablet Take 1 tablet (20 mg total) by mouth daily at 6 PM. 30 tablet  3  . carvedilol (COREG) 12.5 MG tablet Take 1.5 tablets (18.75 mg total) by mouth 2 (two) times daily with a meal. 90 tablet 3  . clopidogrel (PLAVIX) 75 MG tablet Take 1 tablet (75 mg total) by mouth daily. 30 tablet 3  . furosemide (LASIX) 40 MG tablet 1-2 tabs po bid prn, as directed by MD 60 tablet 6  . glucose blood (FREESTYLE TEST STRIPS) test strip Use as instructed 100 each 6  . hydrALAZINE (APRESOLINE) 25 MG tablet Take 1 tablet (25 mg total) by mouth 3 (three) times daily. 90 tablet 3  . insulin aspart (NOVOLOG) 100 UNIT/ML injection Inject  0-10 Units into the skin 3 (three) times daily before meals. Sliding scale    . Insulin Degludec (TRESIBA FLEXTOUCH) 100 UNIT/ML SOPN Inject 18 Units into the skin at bedtime. 5 pen 2  . Insulin Pen Needle 32G X 6 MM MISC Use for insulin injections 4 times per day 150 each 12  . isosorbide mononitrate (IMDUR) 30 MG 24 hr tablet Take 1 tablet (30 mg total) by mouth daily. 30 tablet 3  . lidocaine (LIDODERM) 5 % Place 1 patch onto the skin daily. Remove & Discard patch within 12 hours or as directed by MD 30 patch 0  . nitroGLYCERIN (NITROSTAT) 0.4 MG SL tablet Place 1 tablet (0.4 mg total) under the tongue every 5 (five) minutes as needed for chest pain. 25 tablet 0   No current facility-administered medications for this encounter.   BP 152/74 mmHg  Pulse 63  Wt 233 lb 12.8 oz (106.051 kg)  SpO2 98% General: NAD Neck: JVD about  9-10 , no thyromegaly or thyroid nodule.  Lungs: Clear   CV: Nondisplaced PMI.  Heart regular S1/S2, no S3/S4, 2/6 early SEM RUSB.  No peripheral edema.  Left carotid bruit.  Normal pedal pulses.  Abdomen: Soft, nontender, no hepatosplenomegaly, no distention.  Skin: Intact without lesions or rashes.  Neurologic: Alert and oriented x 3.  Psych: Normal affect. Extremities: No clubbing or cyanosis.  HEENT: Normal.   Assessment/Plan: 1. Chronic systolic CHF: EF 01/3535 40-45%  ischemic cardiomyopathy.   NYHA class II symptoms. Mild volume overload noted from diet.  Increase lasix 40 mg twice a day for 2 days then he will continue lasix  40 mg daily hold lasix if weight at home less than 225 pounds.  - Not on ACEI or spironolactone due to hyperkalemia.  - Continue hydralazine/Imdur.   - Continue Coreg to 18.75 mg bid. Check BMET/BNP now.    2. CAD: Status post Xience DES to LAD and RCA after cardiac arrest.  Will need to follow closely due to bleeding risk on DAPT with chronic thrombocytopenia.   He is currently on both ASA 81 and Plavix as P2Y12 test has not shown  robust platelet inhibition with Plavix (most recently 241 PRU).  No evidence for bleeding.  - Continue statin - Continue ASA 81 and Plavix.  - Continue cardiac rehab.  3. Cirrhosis: Likely related to ETOH.  Has associated splenomegaly and chronic thrombocytopenia.  4. Thrombocytopenia: Chronic, likely due to splenomegaly. Will need to follow plts/hemoglobin over time closely. CBC today.  5. DM: Per endocrinology.  6. Hyperkalemia: Off ACEI and spironolactone.  Check BMET  7. Carotid bruit: On left,  carotid dopplers with mild plaque. Will need follow up in 2 years.    Follow up in 3 weeks. CLEGG,AMY NP-C  04/30/2015

## 2015-04-30 NOTE — Telephone Encounter (Signed)
Lupita Leash with [employer] left a message stating pt has not been taking hydralazine.  She was wondering if the medication had been discontinued. Pt is being seen this morning in clinic Tonye Becket, NP will address.

## 2015-05-01 ENCOUNTER — Encounter (HOSPITAL_COMMUNITY)
Admission: RE | Admit: 2015-05-01 | Discharge: 2015-05-01 | Disposition: A | Discharge status: Home / Self Care | Payer: 59 | Source: Ambulatory Visit | Attending: Cardiology | Admitting: Cardiology

## 2015-05-01 ENCOUNTER — Encounter: Payer: Self-pay | Admitting: Family Medicine

## 2015-05-01 DIAGNOSIS — I509 Heart failure, unspecified: Secondary | ICD-10-CM | POA: Diagnosis not present

## 2015-05-01 LAB — GLUCOSE, CAPILLARY
GLUCOSE-CAPILLARY: 79 mg/dL (ref 65–99)
Glucose-Capillary: 110 mg/dL — ABNORMAL HIGH (ref 65–99)
Glucose-Capillary: 136 mg/dL — ABNORMAL HIGH (ref 65–99)

## 2015-05-01 NOTE — Progress Notes (Signed)
Pt arrived at cardiac rehab today reporting change in medication regimen.  Lasix has been increased.  Pt is taking lasix 40mg  BID for 2 days, then lasix 40mg  once daily until weight returns to 225lb at which time pt should hold lasix. Medication list reconciled. Tonye Becket, NP note reviewed to confirm this order.  Pt verbalized understanding of these directions.

## 2015-05-04 ENCOUNTER — Other Ambulatory Visit (HOSPITAL_COMMUNITY): Payer: Self-pay | Admitting: Adult Health

## 2015-05-04 ENCOUNTER — Encounter (HOSPITAL_COMMUNITY)
Admission: RE | Admit: 2015-05-04 | Discharge: 2015-05-04 | Disposition: A | Discharge status: Home / Self Care | Payer: 59 | Source: Ambulatory Visit | Attending: Cardiology | Admitting: Cardiology

## 2015-05-04 DIAGNOSIS — I509 Heart failure, unspecified: Secondary | ICD-10-CM | POA: Diagnosis not present

## 2015-05-04 LAB — GLUCOSE, CAPILLARY
GLUCOSE-CAPILLARY: 77 mg/dL (ref 65–99)
GLUCOSE-CAPILLARY: 109 mg/dL — AB (ref 65–99)
GLUCOSE-CAPILLARY: 108 mg/dL — AB (ref 65–99)
GLUCOSE-CAPILLARY: 68 mg/dL (ref 65–99)
Glucose-Capillary: 81 mg/dL (ref 65–99)

## 2015-05-04 NOTE — Progress Notes (Signed)
Pt CBG-77 upon arrival to cardiac rehab today. Pt given gingerale, 15 minute recheck CBG:  88. Pt given lemonade and peanut butter cracker.  20 minute recheck CBG:  108.  Pt exercised during 9:45 exercise class.  Post exercise CBG: 68, pt given lemonade.  15 minute recheck:  CBG- 108.    Pt reports his fasting CBG-97 this am, pt ate usual breakfast of oatmeal and apple, took novolog 6 units. Pt is currently taking lasix 40mg  daily.   Pt instructed to only take novolog 3 units prior to exercise. Understanding verbalized.

## 2015-05-06 ENCOUNTER — Encounter (HOSPITAL_COMMUNITY)
Admission: RE | Admit: 2015-05-06 | Discharge: 2015-05-06 | Disposition: A | Discharge status: Home / Self Care | Payer: 59 | Source: Ambulatory Visit | Attending: Cardiology | Admitting: Cardiology

## 2015-05-06 DIAGNOSIS — I509 Heart failure, unspecified: Secondary | ICD-10-CM | POA: Diagnosis not present

## 2015-05-06 LAB — GLUCOSE, CAPILLARY
GLUCOSE-CAPILLARY: 89 mg/dL (ref 65–99)
Glucose-Capillary: 188 mg/dL — ABNORMAL HIGH (ref 65–99)
Glucose-Capillary: 154 mg/dL — ABNORMAL HIGH (ref 65–99)

## 2015-05-08 ENCOUNTER — Other Ambulatory Visit: Payer: Self-pay | Admitting: *Deleted

## 2015-05-08 ENCOUNTER — Encounter (HOSPITAL_COMMUNITY): Payer: Self-pay

## 2015-05-08 ENCOUNTER — Encounter (HOSPITAL_COMMUNITY)
Admission: RE | Admit: 2015-05-08 | Discharge: 2015-05-08 | Disposition: A | Discharge status: Home / Self Care | Payer: 59 | Source: Ambulatory Visit | Attending: Cardiology | Admitting: Cardiology

## 2015-05-08 DIAGNOSIS — I2510 Atherosclerotic heart disease of native coronary artery without angina pectoris: Secondary | ICD-10-CM

## 2015-05-08 DIAGNOSIS — I509 Heart failure, unspecified: Secondary | ICD-10-CM | POA: Diagnosis not present

## 2015-05-08 LAB — GLUCOSE, CAPILLARY
GLUCOSE-CAPILLARY: 208 mg/dL — AB (ref 65–99)
Glucose-Capillary: 131 mg/dL — ABNORMAL HIGH (ref 65–99)

## 2015-05-08 MED ORDER — CLOPIDOGREL BISULFATE 75 MG PO TABS: 75.0000 mg | ORAL_TABLET | Freq: Every day | ORAL | Status: DC

## 2015-05-08 NOTE — Telephone Encounter (Signed)
Pt called requesting refill for Plavix 75mg  daily. Per Dr. [name] okay to refill #30 w/ 3RF.

## 2015-05-08 NOTE — Progress Notes (Signed)
Pt will graduate 05/13/15 from cardiac rehab program  with completion of 36 exercise sessions in Phase II. Pt maintained good attendance and progressed nicely during his participation in rehab as evidenced by increased MET level.   Medication list reconciled. Repeat  PHQ score- 9.  Pt is discouraged with his continued disability.  Pt wants to be able to help others and wants to go back to work.  Vocational rehab is on hold pending physician discretion about returning to work. Pt is currently on short term disability.  Pt does not currently feel strong enough to be able to return to his current position.   Pt has made significant lifestyle changes and should be commended for his success. Pt feels he has achieved his goals during cardiac rehab with the exception of improved balance. Pt does feel he has improved stamina, although he still tires easily.   Pt plans to continue exercising on his own.

## 2015-05-11 ENCOUNTER — Encounter (HOSPITAL_COMMUNITY)
Admission: RE | Admit: 2015-05-11 | Discharge: 2015-05-11 | Disposition: A | Discharge status: Home / Self Care | Payer: 59 | Source: Ambulatory Visit | Attending: Cardiology | Admitting: Cardiology

## 2015-05-11 DIAGNOSIS — I509 Heart failure, unspecified: Secondary | ICD-10-CM | POA: Diagnosis not present

## 2015-05-11 LAB — GLUCOSE, CAPILLARY
GLUCOSE-CAPILLARY: 169 mg/dL — AB (ref 65–99)
Glucose-Capillary: 112 mg/dL — ABNORMAL HIGH (ref 65–99)
Glucose-Capillary: 89 mg/dL (ref 65–99)

## 2015-05-13 ENCOUNTER — Encounter (HOSPITAL_COMMUNITY)
Admission: RE | Admit: 2015-05-13 | Discharge: 2015-05-13 | Disposition: A | Discharge status: Home / Self Care | Payer: 59 | Source: Ambulatory Visit | Attending: Cardiology | Admitting: Cardiology

## 2015-05-13 DIAGNOSIS — I509 Heart failure, unspecified: Secondary | ICD-10-CM | POA: Diagnosis not present

## 2015-05-13 LAB — GLUCOSE, CAPILLARY
Glucose-Capillary: 157 mg/dL — ABNORMAL HIGH (ref 65–99)
Glucose-Capillary: 90 mg/dL (ref 65–99)

## 2015-05-15 ENCOUNTER — Encounter (HOSPITAL_COMMUNITY): Payer: 59

## 2015-05-15 ENCOUNTER — Ambulatory Visit (HOSPITAL_COMMUNITY)
Admission: RE | Admit: 2015-05-15 | Discharge: 2015-05-15 | Disposition: A | Discharge status: Home / Self Care | Payer: 59 | Source: Ambulatory Visit | Attending: Cardiology | Admitting: Cardiology

## 2015-05-15 VITALS — BP 128/60 | HR 63 | Wt 235.4 lb

## 2015-05-15 DIAGNOSIS — I2510 Atherosclerotic heart disease of native coronary artery without angina pectoris: Secondary | ICD-10-CM | POA: Diagnosis not present

## 2015-05-15 DIAGNOSIS — D696 Thrombocytopenia, unspecified: Secondary | ICD-10-CM | POA: Diagnosis not present

## 2015-05-15 DIAGNOSIS — R0989 Other specified symptoms and signs involving the circulatory and respiratory systems: Secondary | ICD-10-CM | POA: Insufficient documentation

## 2015-05-15 DIAGNOSIS — Z794 Long term (current) use of insulin: Secondary | ICD-10-CM | POA: Insufficient documentation

## 2015-05-15 DIAGNOSIS — I5022 Chronic systolic (congestive) heart failure: Secondary | ICD-10-CM | POA: Diagnosis not present

## 2015-05-15 DIAGNOSIS — E875 Hyperkalemia: Secondary | ICD-10-CM | POA: Insufficient documentation

## 2015-05-15 DIAGNOSIS — G629 Polyneuropathy, unspecified: Secondary | ICD-10-CM

## 2015-05-15 DIAGNOSIS — K7460 Unspecified cirrhosis of liver: Secondary | ICD-10-CM | POA: Insufficient documentation

## 2015-05-15 DIAGNOSIS — I255 Ischemic cardiomyopathy: Secondary | ICD-10-CM | POA: Diagnosis not present

## 2015-05-15 DIAGNOSIS — E119 Type 2 diabetes mellitus without complications: Secondary | ICD-10-CM | POA: Diagnosis not present

## 2015-05-15 DIAGNOSIS — R2689 Other abnormalities of gait and mobility: Secondary | ICD-10-CM | POA: Insufficient documentation

## 2015-05-15 MED ORDER — CARVEDILOL 25 MG PO TABS: 25.0000 mg | ORAL_TABLET | Freq: Two times a day (BID) | ORAL | Status: DC

## 2015-05-15 NOTE — Patient Instructions (Signed)
Increase Carvedilol to 25 mg Twice daily, we have sent you in a new prescription for 25 mg tabs  You have been referred to Physical Therapy  We will contact you in 3 months to schedule your next appointment.

## 2015-05-17 NOTE — Progress Notes (Signed)
Patient ID: [name], male   DOB: August 09, 1961, 54 y.o.   MRN: [id number] PCP: Dr. [name]  54 yo with history of CAD s/p recent PCI, cirrhosis with thrombocytopenia, cardiac arrest, and ischemic cardiomyopathy presents for cardiology followup.  Patient was admitted in 7/15-8/15 with gallstone pancreatitis.  It was decided not to do a cholecystectomy due to high surgical risk.  He was sent home and went back to work.  In 10/15, he had a cardiac arrest at work and was defibrillated by AED.  He was cooled and recovered well.  Platelets were initially very low but recovered to his baseline in the 60K range.  Cardiac cath was done showing severe 2 vessel disease.  Cardiac MRI was done, showing EF 40% with a mixed viability picture.  With recovery of platelets, he had Xience DES to the LAD and the distal RCA.  He had acute on chronic systolic CHF with volume overload and was diuresed and eventually discharge.  Unfortunately, he was sent home on daily metolazone and developed AKI.  Diuretics were held and this has gradually improved.  Spironolactone and lisinopril have been stopped due to hyperkalemia.     He returns for followup.  Taking Lasix regularly.  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  He has balance trouble that seems to be due to diabetic neuropathy => numbness in his feet.  No exertional dyspnea, no chest pain, no palpitations.  He has completed cardiac rehab.    Labs (11/15): K 4.7 => 7.2, creatinine 1.37 => 2.1 => 2.25 with BUN 94, HCT 26.9, plts 67, P2Y12 241 Labs (11/20/14): K 6.2, creatinine 1.1, HCT 33.2, plts 45 Labs (12/18/14): K 6.1 creatinine 1.07 off spiro and lisinopril.  Labs (1/16): K 5.2, creatinine 1.12 Labs (2/16): LDL 59, HDL 41, HCT 32.4 Labs (3/16): K 5.4, creatinine 1.27 Labs (4/16): HCT 32.9, plts 47 Labs (5/16): K 5.2, creatinine 1.23, BNP 352  PMH: 1. 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  2. Thrombocytopenia: Chronic.  Suspect related  to splenomegaly in setting of cirrhosis.   3. Gallstone pancreatitis: He did not have cholecystectomy as deemed too high a surgical risk.  4. CKD 5. Type II diabetes with diabetic neuropathy 6. Bicuspid aortic valve: With no AS and mild AI on echo in 1/16.   7. H/o perforated diverticulum.  8. CAD: Cardiac arrest 10/15.  LHC (10/15) with 80-90% proximal ramus stenosis, 90% mLAD, subtotaled distal LAD, 90% D1, 90% dRCA/proximal PDA.  Patient had Xience DES to mLAD, dRCA.  9. Cardiac arrest: 10/15, defibrillated with AED and cooled.  10. Ischemic cardiomyopathy: Echo (8/15) with EF 35-40%, bicuspid aortic valve with mild AS/AI.  Cardiac MRI (10/15) with EF 40%, regional wall motion abnormalities, normal RV, delayed enhancement pattern suggesting mixed picture of viability. Echo (1/16) with EF 35-40%, hypokinesis of the mid to apical septum and the true apex, bicuspid aortic valve, mild AI.  11. Carotid dopplers (4/16): Mild plaque  SH: Never smoked.  Prior ETOH abuse, now stopped.  Works at [employer] and [employer].  Lives with sister in [place].   FH: No premature CAD.   ROS: All systems reviewed and negative except as per HPI.   Current Outpatient Prescriptions  Medication Sig Dispense Refill  . aspirin 81 MG chewable tablet Chew 1 tablet (81 mg total) by mouth daily.    [name] atorvastatin (LIPITOR) 20 MG tablet Take 1 tablet (20 mg total) by mouth daily at 6 PM. 30 tablet 3  .  carvedilol (COREG) 25 MG tablet Take 1 tablet (25 mg total) by mouth 2 (two) times daily with a meal. 60 tablet 6  . clopidogrel (PLAVIX) 75 MG tablet Take 1 tablet (75 mg total) by mouth daily. 30 tablet 3  . furosemide (LASIX) 40 MG tablet Take 1 tablet (40 mg total) by mouth daily. Hold if weight is 225 lb or less 60 tablet 6  . hydrALAZINE (APRESOLINE) 25 MG tablet Take 1 tablet (25 mg total) by mouth 3 (three) times daily. 90 tablet 3  . insulin aspart (NOVOLOG) 100 UNIT/ML injection Inject 0-10 Units into the skin 3  (three) times daily before meals. Sliding scale    . Insulin Degludec (TRESIBA FLEXTOUCH) 100 UNIT/ML SOPN Inject 18 Units into the skin at bedtime. 5 pen 2  . Insulin Pen Needle 32G X 6 MM MISC Use for insulin injections 4 times per day 150 each 12  . isosorbide mononitrate (IMDUR) 30 MG 24 hr tablet TAKE 1 TABLET (30 MG TOTAL) BY MOUTH DAILY. 30 tablet 3  . nitroGLYCERIN (NITROSTAT) 0.4 MG SL tablet Place 1 tablet (0.4 mg total) under the tongue every 5 (five) minutes as needed for chest pain. 25 tablet 0  . glucose blood (FREESTYLE TEST STRIPS) test strip Use as instructed 100 each 6  . lidocaine (LIDODERM) 5 % Place 1 patch onto the skin daily. Remove & Discard patch within 12 hours or as directed by MD (Patient not taking: Reported on 05/15/2015) 30 patch 0   No current facility-administered medications for this encounter.   BP 128/60 mmHg  Pulse 63  Wt 235 lb 6.4 oz (106.777 kg)  SpO2 98% General: NAD Neck: JVD 7, no thyromegaly or thyroid nodule.  Lungs: Clear   CV: Nondisplaced PMI.  Heart regular S1/S2, no S3/S4, 2/6 early SEM RUSB.  No peripheral edema.  Left carotid bruit.  Normal pedal pulses.  Abdomen: Soft, nontender, no hepatosplenomegaly, no distention.  Skin: Intact without lesions or rashes.  Neurologic: Alert and oriented x 3.  Psych: Normal affect. Extremities: No clubbing or cyanosis.  HEENT: Normal.   Assessment/Plan: 1. Chronic systolic CHF: Echo 03/2015 with EF 40-45%, ischemic cardiomyopathy.  NYHA class II symptoms. No volume overload on exam today.  - Continue Lasix 40 mg daily and keep sodium in diet low.  - Not on ACEI or spironolactone due to hyperkalemia.  - Continue hydralazine/Imdur.   - Increase Coreg to 25 mg bid.  2. CAD: Status post Xience DES to LAD and RCA after cardiac arrest.  Will need to follow closely due to bleeding risk on DAPT with chronic thrombocytopenia.   He is currently on both ASA 81 and Plavix as P2Y12 test has not shown robust  platelet inhibition with Plavix (most recently 241 PRU).  No evidence for bleeding.  - Continue statin - Continue ASA 81 and Plavix. Stop Plavix in 10/16.  3. Cirrhosis: Likely related to ETOH.  Has associated splenomegaly and chronic thrombocytopenia.  4. Thrombocytopenia: Chronic, likely due to splenomegaly. Will need to follow plts/hemoglobin over time closely.  5. DM: Per endocrinology.  6. Hyperkalemia: Off ACEI and spironolactone.    7. Carotid bruit: On left,  carotid dopplers with mild plaque. Will need follow up in 2 years.   8. Imbalance: May be related to diabetic neuropathy.  Thinks balance trouble is going to make it hard to do his job.  Will set him up with outpatient PT for balance training.   Followup in 3 months.  Marca Ancona 05/17/2015

## 2015-05-20 ENCOUNTER — Encounter (HOSPITAL_COMMUNITY): Payer: 59

## 2015-05-21 ENCOUNTER — Encounter: Payer: Self-pay | Admitting: Cardiology

## 2015-05-22 ENCOUNTER — Ambulatory Visit (INDEPENDENT_AMBULATORY_CARE_PROVIDER_SITE_OTHER): Payer: 59 | Admitting: Internal Medicine

## 2015-05-22 ENCOUNTER — Encounter: Payer: Self-pay | Admitting: Internal Medicine

## 2015-05-22 ENCOUNTER — Encounter (HOSPITAL_COMMUNITY): Payer: 59

## 2015-05-22 VITALS — BP 124/72 | HR 61 | Temp 98.5°F | Resp 12 | Wt 235.6 lb

## 2015-05-22 DIAGNOSIS — IMO0002 Reserved for concepts with insufficient information to code with codable children: Secondary | ICD-10-CM

## 2015-05-22 DIAGNOSIS — E118 Type 2 diabetes mellitus with unspecified complications: Secondary | ICD-10-CM

## 2015-05-22 DIAGNOSIS — E1165 Type 2 diabetes mellitus with hyperglycemia: Secondary | ICD-10-CM

## 2015-05-22 LAB — HEMOGLOBIN A1C: Hgb A1c MFr Bld: 6.4 % (ref 4.6–6.5)

## 2015-05-22 NOTE — Progress Notes (Signed)
Patient ID: [name], male   DOB: [birth year], 54 y.o.   MRN: [id number]  HPI: [name] is a 54 y.o.-year-old male, returning for f/u for DM2, dx 2012, insulin-dependent since dx, uncontrolled, with complications (CRI stage 2, CAD, ischemic cardiomyopathy, PN, DR, L 4 and 5th toes amputated 2/2 osteomyelitis - 09/2012). Last visit 3 mo ago.   He has a h/o cardio-respiratory arrest 10/02/2014 (hypotension, hyperkalemia) >> resuscitated.  He had stents placed for CAD.   He does PT for neuropathy ([place]).  Last hemoglobin A1c was: Lab Results  Component Value Date   HGBA1C 6.3 02/19/2015   HGBA1C 5.8* 10/25/2014   HGBA1C 5.8* 10/10/2014   Pt is on a regimen of: Lantus 18 units at bedtime >> Tresiba 18 units at bedtime NovoLog mealtime  - 6 units with a meal before exercise >> still dropping sugars with exercise - 8 units with a larger meal NovoLog SSI - not having to use it - target: 130 - insulin sensitivity factor: 30 Previously on Victoza.  He works 2 weeks shifts - but not back to work yet: - nights 11 pm - 7:30 am - pm: 3 pm - 11:30 pm - am: 7 am - 3:30 pm  Pt checks his sugars 3x a day: - am: 86-126 >> 90-100 >> 90-124 >> 79-127 - 2h after b'fast: n/c >> 154-164, 185, 253x1 >> n/c Exercises in am - before lunch: 68 (when works nights)-136 >> 120s >> 128 >> 68, 109-157 (given juice at rehab) - 2h after lunch: n/c >> 132 >> n/c - before dinner: 126-137 >> 120s >> 147-157, 202 - 2h after dinner: n/c - bedtime: 120-130s >> 100-110 >> 134 >> 147-160 - nighttime: n/c Lowest sugar was 80s; he has hypoglycemia awareness at 80.   Meter: Freestyle Lite.  Pt's meals are: - Breakfast: yoghurt + fruit; PB + apple; eggs + toast - Lunch: leftovers or sandwich - Dinner: meat + starch (sometimes) + vegetables - Snacks: PB crackers, no soft drinks  - Has CKD, last BUN/creatinine:  Lab Results  Component Value Date   BUN 38* 04/30/2015   CREATININE 1.23 04/30/2015   On Lisinopril - last set of lipids: Lab Results  Component Value Date   CHOL 132 02/09/2015   HDL 41 02/09/2015   LDLCALC 59 02/09/2015   TRIG 160* 02/09/2015   CHOLHDL 3.2 02/09/2015   - last eye exam was in 08/28/2014. + DR. He had surgery in 08/2014. - + numbness and tingling in his feet. Has severe PN.  Pt has a h/o pancreatitis (gall bladder stones), cirrhosis (NASH).  ROS: Constitutional: + weight gain, no fatigue, no subjective hypo-/hyperthermia Eyes: no blurry vision, no xerophthalmia ENT: no sore throat, no nodules palpated in throat, no dysphagia/odynophagia, no hoarseness Cardiovascular: no CP/SOB/no palpitations/+ leg swelling Respiratory: no cough/SOB Gastrointestinal: no N/+ V/no D/C Musculoskeletal: no muscle/+ joint aches Skin: no rashes Neurological: no tremors/+ numbness (legs)/tingling/dizziness + diff with erections  I reviewed pt's medications, allergies, PMH, social hx, family hx, and changes were documented in the history of present illness. Otherwise, unchanged from my initial visit note. Changed the dose of Carvedilol and decreased Lasix. Past Medical History  Diagnosis Date  . Heart murmur     ECHO 07/2011 showed mild AS and LVH.  TEE 09/2011 showed small PFO, bicuspid aortic valve but no stenosis or regurg.  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  Gen surg and GI recommended flagyl and cipro x 2 wks..  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 Juanda Walker 12/01/11 showed HEALED fistula, no other abnormality, recommended next colonoscopy 10 yrs.  . Cholelithiasis     u/s--no cholecystitis.  06/2014 u/s showed no stones, only GB sludge  . Peripheral neuropathy     Diabetic:  Autonomic (pelvic) and LE polyneuropathy--[facility] testing showed that it is likely from DM  . Diabetic foot ulcers     Poor healing; normal ABIs and TBIs  . Arthritis     back L3 - L5  . Hyperactive gag reflex   .  History of pyelonephritis   . Type II or unspecified type diabetes mellitus with neurological manifestations, not stated as uncontrolled     IDDM: neuro, renal, and ophth complications  . Wears dentures     upper  . Osteomyelitis of toe of left foot     left 4th and 5th toes--now s/p amputation of these areas  . Diabetic retinopathy associated with type 2 diabetes mellitus     Laser tx in both eyes (Dr. Allyne Walker)  . Diabetic nephropathy     Proteinuria and CrCl 55-65  . Chronic renal insufficiency, stage III (moderate)     CrCl 50s-80s 2014 to pre-VFIB arrest 10/02/14, at which time he sustained AKI.  . Lumbar spondylosis     MRI 08/25/11 with multilevel facet dz, small disc protrusion at L5-S1 to the right without impingement  . Ischemic cardiomyopathy 07/22/14    Myoview w/out ischemia but large scar (inferior/apex) w/ global hypokinesis--medical mgmt  . History of pancreatitis 2014 and 2015    Suspected biliary: passing small gallstones: surgery declined to remove GB 07/2014.  . Aspiration pneumonia   . Thrombocytopenia   . Portal hypertension   . Liver cirrhosis secondary to NASH     ?+ alcohol?.  With hx of splenomegaly and ascities (dx approx 2012)  . History of gram negative sepsis 2015    e coli  . Ventricular fibrillation 10/02/14    Resuscitated, hospitalized, 3 V CAD detected, DES stent to mid LAD and to RCA were placed.   Past Surgical History  Procedure Laterality Date  . Knee arthroscopy  2001    Bilateral  . Amputation  10/24/2012    Procedure: AMPUTATION RAY;  Surgeon: Ralph Rad, MD;  Location: [facility];  Service: Orthopedics;  Laterality: Left;  left 4th toe amputation through MTP joint, 5th Ray amputation   . Knee surgery  2001  . Hida scan  06/2014    Normal  . Transthoracic echocardiogram  07/19/14;01/07/15    Mild LV dilation and hypertrophy, EF 35-40%, no wall motion abnormalities, grade II diast dysfxn, bicuspid aortic valve with mild AS and  mild AI.  No change on 12/2014 echo.  . Cardiovascular stress test  07/2014    Myoview w/out ischemia but large scar (inferior/apex) w/ global hypokinesis--medical mgmt  . Eye surgery  08/2014    Retinal hemorrhage evacuation  . Coronary angioplasty with stent placement  09/2014    DES to mid LAD and RCA; EF 40% by cardiac MRI  . Cardiac mri  09/2014    Moderately dilated LV.  EF 40%, multiple wall motion abnormalities, normal RV fxn.  Some areas of scarring.  . Left heart catheterization with coronary angiogram N/A 10/10/2014    Procedure: LEFT HEART CATHETERIZATION WITH CORONARY ANGIOGRAM;  Surgeon: Peter M Swaziland, MD;  Location: [facility] CATH LAB;  Service:  Cardiovascular;  Laterality: N/A;  . Left heart catheterization with coronary angiogram N/A 10/15/2014    Procedure: LEFT HEART CATHETERIZATION WITH CORONARY ANGIOGRAM;  Surgeon: Iran Ouch, MD;  Location: [facility] CATH LAB;  Service: Cardiovascular;  Laterality: N/A;  . Carotid dopplers  04/10/15    1-35% bilat; normal subclavians   History   Social History  . Marital Status: Single    Spouse Name: N/A    Number of Children: 0   Occupational History  . worker   Social History Main Topics  . Smoking status: Never Smoker   . Smokeless tobacco: Current User    Types: Chew  . Alcohol Use: No     Comment: Quit drinking alcohol 08/20/2011  . Drug Use: No   Social History Narrative   Single, no children.  Lives in [place], relocated from [place], [place] in 2008.   Chews tobacco but does not smoke.   Drinks 12 Beers daily---patient did not initially report/admit this--as of 2013 he has quit this.   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   Current Outpatient Prescriptions on File Prior to Visit  Medication Sig Dispense Refill  . aspirin 81 MG chewable tablet Chew 1 tablet (81 mg total) by mouth daily.    [name] atorvastatin (LIPITOR) 20 MG tablet Take 1 tablet (20 mg total) by mouth daily at 6 PM. 30 tablet 3   . carvedilol (COREG) 25 MG tablet Take 1 tablet (25 mg total) by mouth 2 (two) times daily with a meal. 60 tablet 6  . clopidogrel (PLAVIX) 75 MG tablet Take 1 tablet (75 mg total) by mouth daily. 30 tablet 3  . furosemide (LASIX) 40 MG tablet Take 1 tablet (40 mg total) by mouth daily. Hold if weight is 225 lb or less 60 tablet 6  . glucose blood (FREESTYLE TEST STRIPS) test strip Use as instructed 100 each 6  . hydrALAZINE (APRESOLINE) 25 MG tablet Take 1 tablet (25 mg total) by mouth 3 (three) times daily. 90 tablet 3  . insulin aspart (NOVOLOG) 100 UNIT/ML injection Inject 0-10 Units into the skin 3 (three) times daily before meals. Sliding scale    . Insulin Degludec (TRESIBA FLEXTOUCH) 100 UNIT/ML SOPN Inject 18 Units into the skin at bedtime. 5 pen 2  . Insulin Pen Needle 32G X 6 MM MISC Use for insulin injections 4 times per day 150 each 12  . isosorbide mononitrate (IMDUR) 30 MG 24 hr tablet TAKE 1 TABLET (30 MG TOTAL) BY MOUTH DAILY. 30 tablet 3  . lidocaine (LIDODERM) 5 % Place 1 patch onto the skin daily. Remove & Discard patch within 12 hours or as directed by MD 30 patch 0  . nitroGLYCERIN (NITROSTAT) 0.4 MG SL tablet Place 1 tablet (0.4 mg total) under the tongue every 5 (five) minutes as needed for chest pain. 25 tablet 0   No current facility-administered medications on file prior to visit.   Allergies  Allergen Reactions  . Iodine Other (See Comments)    Family history of anaphylaxis, unsure if patient has reaction  . Other Shortness Of Breath, Itching and Other (See Comments)    PERFUMES - SNEEZING  . Hydralazine Hcl Hives  . Naproxen Nausea And Vomiting   Family History  Problem Relation Age of Onset  . Heart disease Father   . Diabetes type II Sister    PE: BP 124/72 mmHg  Pulse 61  Temp(Src) 98.5 F (36.9 C) (Oral)  Resp 12  Wt 235 lb 9.6 oz (106.867 kg)  SpO2 96% Body mass index is 33.8 kg/(m^2).  Wt Readings from Last 3 Encounters:  05/22/15 235 lb 9.6  oz (106.867 kg)  05/15/15 235 lb 6.4 oz (106.777 kg)  04/21/15 230 lb (104.327 kg)   Constitutional: overweight, in NAD Eyes: PERRLA, EOMI, no exophthalmos ENT: moist mucous membranes, no thyromegaly, no cervical lymphadenopathy Cardiovascular: RRR, No MRG Respiratory: CTA B Gastrointestinal: abdomen soft, NT, ND, BS+ Musculoskeletal: no deformities, strength intact in all 4 Skin: moist, warm,+ stasis dermatitis Neurological: no tremor with outstretched hands, DTR normal in all 4  ASSESSMENT: 1. DM2, insulin-dependent, controlled, with complications - CRI stage 2 - Cardiomyopathy - PN - DR - L 4 and 5th toes amputated 2/2 osteomyelitis - 09/2012  PLAN:  1. Patient with long-standing, controlled diabetes, on basal-bolus regimen, with good CBG control. Sugars lower after exercise and higher before dinner >> decrease am mealtime insulin and increase insulin with lunch. We willcontinue Tresiba  instead of Lantus, since he will start shift work soon. This is more flexible, and he can take it at different times of day.  Patient Instructions  Please continueTresiba 18 units at bedtime.  Decrease: NovoLog at mealtime: - 4 units before breakfast - 10 units before lunch - 8 units before dinner Do not take insulin with breakfast if sugars before breakfast are <100.  Please stop at the lab.  Please come back for a follow-up appointment in 3 months with your sugar log.  - continue checking sugars at different times of the day - check 2 times a day, rotating checks - advised for yearly eye exams >> he is up to date - will check HbA1c today. - Return to clinic in 3 mo with sugar log   Office Visit on 05/22/2015  Component Date Value Ref Range Status  . Hgb A1c MFr Bld 05/22/2015 6.4  4.6 - 6.5 % Final   Glycemic Control Guidelines for People with Diabetes:Non Diabetic:  <6%Goal of Therapy: <7%Additional Action Suggested:  >8%    HbA1c stable, at goal.

## 2015-05-22 NOTE — Patient Instructions (Signed)
Please continueTresiba 18 units at bedtime.  Decrease: NovoLog at mealtime: - 4 units before breakfast - 10 units before lunch - 8 units before dinner Do not take insulin with breakfast if sugars before breakfast are <100.  Please stop at the lab.  Please come back for a follow-up appointment in 3 months with your sugar log.

## 2015-05-25 ENCOUNTER — Encounter (HOSPITAL_COMMUNITY): Payer: 59

## 2015-05-27 ENCOUNTER — Encounter (HOSPITAL_COMMUNITY): Payer: 59

## 2015-05-29 ENCOUNTER — Encounter (HOSPITAL_COMMUNITY): Payer: 59

## 2015-06-01 ENCOUNTER — Ambulatory Visit: Payer: 59 | Attending: Cardiology | Admitting: Physical Therapy

## 2015-06-01 DIAGNOSIS — R269 Unspecified abnormalities of gait and mobility: Secondary | ICD-10-CM | POA: Diagnosis not present

## 2015-06-01 DIAGNOSIS — M259 Joint disorder, unspecified: Secondary | ICD-10-CM | POA: Diagnosis not present

## 2015-06-01 DIAGNOSIS — R29898 Other symptoms and signs involving the musculoskeletal system: Secondary | ICD-10-CM

## 2015-06-01 NOTE — Therapy (Signed)
[facility] [address]  [address] [place], [place], [address] Phone: [phone]   Fax:  [phone]  Physical Therapy Evaluation  Patient Details  Name: [name] MRN: [id number] Date of Birth: October 03, 1961 Referring Provider:  Laurey Morale, MD  Encounter Date: 06/01/2015      PT End of Session - 06/01/15 0821    Visit Number 1   Number of Visits 12   Date for PT Re-Evaluation 07/13/15   PT Start Time 0805   PT Stop Time 0849   PT Time Calculation (min) 44 min      Past Medical History  Diagnosis Date  . Heart murmur     ECHO 07/2011 showed mild AS and LVH.  TEE 09/2011 showed small PFO, bicuspid aortic valve but no stenosis or regurg.  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  Gen surg and GI recommended flagyl and cipro x 2 wks..  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  . Cholelithiasis     u/s--no cholecystitis.  06/2014 u/s showed no stones, only GB sludge  . Peripheral neuropathy     Diabetic:  Autonomic (pelvic) and LE polyneuropathy--[facility] testing showed that it is likely from DM  . Diabetic foot ulcers     Poor healing; normal ABIs and TBIs  . Arthritis     back L3 - L5  . Hyperactive gag reflex   . History of pyelonephritis   . Type II or unspecified type diabetes mellitus with neurological manifestations, not stated as uncontrolled     IDDM: neuro, renal, and ophth complications  . Wears dentures     upper  . Osteomyelitis of toe of left foot     left 4th and 5th toes--now s/p amputation of these areas  . Diabetic retinopathy associated with type 2 diabetes mellitus     Laser tx in both eyes (Dr. Allyne Gee)  . Diabetic nephropathy     Proteinuria and CrCl 55-65  . Chronic renal insufficiency, stage III (moderate)     CrCl 50s-80s  2014 to pre-VFIB arrest 10/02/14, at which time he sustained AKI.  . Lumbar spondylosis     MRI 08/25/11 with multilevel facet dz, small disc protrusion at L5-S1 to the right without impingement  . Ischemic cardiomyopathy 07/22/14    Myoview w/out ischemia but large scar (inferior/apex) w/ global hypokinesis--medical mgmt  . History of pancreatitis 2014 and 2015    Suspected biliary: passing small gallstones: surgery declined to remove GB 07/2014.  . Aspiration pneumonia   . Thrombocytopenia   . Portal hypertension   . Liver cirrhosis secondary to NASH     ?+ alcohol?.  With hx of splenomegaly and ascities (dx approx 2012)  . History of gram negative sepsis 2015    e coli  . Ventricular fibrillation 10/02/14    Resuscitated, hospitalized, 3 V CAD detected, DES stent to mid LAD and to RCA were placed.    Past Surgical History  Procedure Laterality Date  . Knee arthroscopy  2001    Bilateral  . Amputation  10/24/2012    Procedure: AMPUTATION RAY;  Surgeon: Sherri Rad, MD;  Location: [facility];  Service: Orthopedics;  Laterality: Left;  left 4th toe amputation through MTP joint, 5th Ray amputation   . Knee surgery  2001  .  Hida scan  06/2014    Normal  . Transthoracic echocardiogram  07/19/14;01/07/15    Mild LV dilation and hypertrophy, EF 35-40%, no wall motion abnormalities, grade II diast dysfxn, bicuspid aortic valve with mild AS and mild AI.  No change on 12/2014 echo.  . Cardiovascular stress test  07/2014    Myoview w/out ischemia but large scar (inferior/apex) w/ global hypokinesis--medical mgmt  . Eye surgery  08/2014    Retinal hemorrhage evacuation  . Coronary angioplasty with stent placement  09/2014    DES to mid LAD and RCA; EF 40% by cardiac MRI  . Cardiac mri  09/2014    Moderately dilated LV.  EF 40%, multiple wall motion abnormalities, normal RV fxn.  Some areas of scarring.  . Left heart catheterization with coronary angiogram N/A 10/10/2014    Procedure:  LEFT HEART CATHETERIZATION WITH CORONARY ANGIOGRAM;  Surgeon: Peter M Swaziland, MD;  Location[facility] C[facility] LAB;  Service: Cardiovascular;  Laterality: N/A;  . Left heart catheterization with coronary angiogram N/A 10/15/2014    Procedure: LEFT HEART CATHETERIZATION WITH CORONARY ANGIOGRAM;  Surgeon: Iran Ouch, MD;  Location: [facility] CATH LAB;  Service: Cardiovascular;  Laterality: N/A;  . Carotid dopplers  04/10/15    1-35% bilat; normal subclavians    There were no vitals filed for this visit.  Visit Diagnosis:  Abnormality of gait - Plan: PT plan of care cert/re-cert  Ankle weakness - Plan: PT plan of care cert/re-cert      Subjective Assessment - 06/01/15 0811    Subjective Pt with peripheral neuropathy for past several years related to diabetes.  Pt with recent MI which resulted in 2 stent placments.  Pt states he completed cardiac rehab on 25May2016 however he continues to note impaired balance so he is now being sent to OPPT to address impaired balance.  Pt with c/o N/T L LE > R stating is from hip to foot.   Pertinent History diabetes, recent MI and stent placement   Patient Stated Goals get back to normal   Currently in Pain? No/denies            [facility] PT Assessment - 06/01/15 0001    Assessment   Medical Diagnosis peripheral neuropathy and impaired balance   Onset Date/Surgical Date 10/02/14   Precautions   Precautions Fall   Precaution Comments Pt is not to allow HR greater than 143   Balance Screen   Has the patient fallen in the past 6 months Yes   How many times? 3   Has the patient had a decrease in activity level because of a fear of falling?  Yes   Is the patient reluctant to leave their home because of a fear of falling?  No   Home Environment   Living Environment Private residence   Living Arrangements Other relatives   Type of Home House   Home Access Stairs to enter   Entrance Stairs-Number of Steps 1   Home Layout One level   Prior Function   Vocation  Full time employment  has been out of work since 10/02/14   Vocation Requirements heavy lifting, pushing/pulling   Leisure pt walking approx 2 miles / day; no other exercise   Observation/Other Assessments   Focus on Therapeutic Outcomes (FOTO)  60% limitation   ROM / Strength   AROM / PROM / Strength Strength   Strength   Strength Assessment Site Hip   Right/Left Hip Right;Left   Right Hip Flexion 4/5  Right Hip Extension 4+/5   Right Hip External Rotation  --  5/5   Right Hip Internal Rotation  --  5/5   Right Hip ABduction 4+/5   Right Hip ADduction 4+/5   Left Hip Flexion 4/5   Left Hip Extension 4/5   Left Hip External Rotation  --  5/5   Left Hip Internal Rotation  --  5/5   Left Hip ABduction 4+/5   Left Hip ADduction 4/5   Right/Left Ankle Right;Left   Right Ankle Dorsiflexion 4/5   Right Ankle Plantar Flexion 3/5   Right Ankle Inversion 4+/5   Right Ankle Eversion 4/5   Left Ankle Dorsiflexion 3+/5   Left Ankle Plantar Flexion 3/5   Left Ankle Inversion 3+/5   Left Ankle Eversion 3+/5   Standardized Balance Assessment   Standardized Balance Assessment Berg Balance Test   Berg Balance Test   Sit to Stand Able to stand without using hands and stabilize independently   Standing Unsupported Able to stand safely 2 minutes   Sitting with Back Unsupported but Feet Supported on Floor or Stool Able to sit safely and securely 2 minutes   Stand to Sit Sits safely with minimal use of hands   Transfers Able to transfer safely, minor use of hands   Standing Unsupported with Eyes Closed Able to stand 10 seconds with supervision   Standing Ubsupported with Feet Together Able to place feet together independently and stand 1 minute safely   From Standing, Reach Forward with Outstretched Arm Can reach forward >12 cm safely (5")   From Standing Position, Pick up Object from Floor Able to pick up shoe, needs supervision   From Standing Position, Turn to Look Behind Over each  Shoulder Turn sideways only but maintains balance   Turn 360 Degrees Able to turn 360 degrees safely but slowly   Standing Unsupported, Alternately Place Feet on Step/Stool Able to complete 4 steps without aid or supervision   Standing Unsupported, One Foot in Front Able to plae foot ahead of the other independently and hold 30 seconds   Standing on One Leg Tries to lift leg/unable to hold 3 seconds but remains standing independently   Total Score 43         TODAY'S TREATMENT TherEx - initial HEP instruct and perform (see HEP)                  PT Education - 06/01/15 0848    Education provided Yes   Education Details Initial HEP   Person(s) Educated Patient   Methods Explanation;Demonstration;Handout   Comprehension Verbalized understanding;Returned demonstration          PT Short Term Goals - 06/01/15 1025    PT SHORT TERM GOAL #1   Title pt independent with initial HEP by 06/10/15   Status New           PT Long Term Goals - 06/01/15 1026    PT LONG TERM GOAL #1   Title pt scores 50/56 or better with Berg assessment by 07/13/15   Status New   PT LONG TERM GOAL #2   Title pt displays B ankle MMT 4/5 or greater all planes by 07/13/15   Status New   PT LONG TERM GOAL #3   Title pt denies limiting level of activity due to fear of falling by 07/13/15   Status New   PT LONG TERM GOAL #4   Title pt able to ambulate over level and uneven terrain  with good mechanics without need for AD and distances not limited by fear of falling by 07/13/15   Status New               Plan - 06/01/15 1021    Clinical Impression Statement pt sent to OPPT due to impaired balance and fall history which is related to peripheral neuropathy (scores 43/56 with Berg assessment).  Assessment today also reveals significant weakness to B Ankles (L worse which is 3+/5 in most planes) along with mild B hip weakness.  Pt with PMHx significant for recent MI and stent placement - he  comleted caridac rehab 2 weeks ago and he has been advised to limit HRto 143 bpm with activity.   Pt will benefit from skilled therapeutic intervention in order to improve on the following deficits Decreased balance;Decreased strength;Abnormal gait   Rehab Potential Good   PT Frequency 2x / week   PT Duration 6 weeks   PT Treatment/Interventions Therapeutic exercise;Therapeutic activities;Balance training;Neuromuscular re-education;Gait training;Patient/family education;ADLs/Self Care Home Management   PT Next Visit Plan ankle and hip strengthening, gait and balance training   Consulted and Agree with Plan of Care Patient         Problem List Patient Active Problem List   Diagnosis Date Noted  . Carotid bruit present 04/30/2015  . Chronic systolic heart failure 11/03/2014  . CAD (coronary artery disease) 10/24/2014  . Hyperkalemia 10/24/2014  . NSTEMI (non-ST elevated myocardial infarction) 10/20/2014  . Cardiomyopathy, ischemic 10/20/2014  . Anemia due to other cause 10/06/2014  . Liver cirrhosis secondary to NASH 09/18/2014  . Diaphoresis 09/18/2014  . Abnormal finding on EKG 07/19/2014  . Ascites 07/19/2014  . Cardiomyopathy 07/19/2014  . Elevated troponin 07/19/2014  . Dehydration 07/18/2014  . Gastroenteritis 04/14/2014  . Postprandial bloating 04/14/2014  . Chronic renal insufficiency, stage II (mild) 01/12/2014  . History of pancreatitis 10/07/2013  . Diabetes mellitus type 2, uncontrolled, with complications 01/07/2012  . Cirrhosis 11/07/2011  . Peripheral neuropathy 09/12/2011  . Thrombocytopenia 07/14/2011  . HTN (hypertension), benign 06/30/2011    Edrie Ehrich PT, OCS 06/01/2015, 10:32 AM  [facility] [address]  [address] [place], [place], [address] Phone: [phone]   Fax:  [phone]

## 2015-06-05 ENCOUNTER — Ambulatory Visit: Payer: 59 | Admitting: Rehabilitation

## 2015-06-05 DIAGNOSIS — R29898 Other symptoms and signs involving the musculoskeletal system: Secondary | ICD-10-CM

## 2015-06-05 DIAGNOSIS — R269 Unspecified abnormalities of gait and mobility: Secondary | ICD-10-CM | POA: Diagnosis not present

## 2015-06-05 NOTE — Therapy (Signed)
[facility] [address]  [address] [place], [place], [address] Phone: [phone]   Fax:  [phone]  Physical Therapy Treatment  Patient Details  Name: [name] MRN: [id number] Date of Birth: [birth year] Referring Provider:  [name], MD  Encounter Date: 06/05/2015      PT End of Session - 06/05/15 1010    Visit Number 2   Number of Visits 12   Date for PT Re-Evaluation 07/13/15   PT Start Time 1008   PT Stop Time 1050   PT Time Calculation (min) 42 min      Past Medical History  Diagnosis Date  . Heart murmur     ECHO 07/2011 showed mild AS and LVH.  TEE 09/2011 showed small PFO, bicuspid aortic valve but no stenosis or regurg.  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  Gen surg and GI recommended flagyl and cipro x 2 wks..  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  . Cholelithiasis     u/s--no cholecystitis.  06/2014 u/s showed no stones, only GB sludge  . Peripheral neuropathy     Diabetic:  Autonomic (pelvic) and LE polyneuropathy--[facility] testing showed that it is likely from DM  . Diabetic foot ulcers     Poor healing; normal ABIs and TBIs  . Arthritis     back L3 - L5  . Hyperactive gag reflex   . History of pyelonephritis   . Type II or unspecified type diabetes mellitus with neurological manifestations, not stated as uncontrolled     IDDM: neuro, renal, and ophth complications  . Wears dentures     upper  . Osteomyelitis of toe of left foot     left 4th and 5th toes--now s/p amputation of these areas  . Diabetic retinopathy associated with type 2 diabetes mellitus     Laser tx in both eyes (Dr. Allyne Gee)  . Diabetic nephropathy     Proteinuria and CrCl 55-65  . Chronic renal insufficiency, stage III (moderate)     CrCl 50s-80s  2014 to pre-VFIB arrest 10/02/14, at which time he sustained AKI.  . Lumbar spondylosis     MRI 08/25/11 with multilevel facet dz, small disc protrusion at L5-S1 to the right without impingement  . Ischemic cardiomyopathy 07/22/14    Myoview w/out ischemia but large scar (inferior/apex) w/ global hypokinesis--medical mgmt  . History of pancreatitis 2014 and 2015    Suspected biliary: passing small gallstones: surgery declined to remove GB 07/2014.  . Aspiration pneumonia   . Thrombocytopenia   . Portal hypertension   . Liver cirrhosis secondary to NASH     ?+ alcohol?.  With hx of splenomegaly and ascities (dx approx 2012)  . History of gram negative sepsis 2015    e coli  . Ventricular fibrillation 10/02/14    Resuscitated, hospitalized, 3 V CAD detected, DES stent to mid LAD and to RCA were placed.    Past Surgical History  Procedure Laterality Date  . Knee arthroscopy  2001    Bilateral  . Amputation  10/24/2012    Procedure: AMPUTATION RAY;  Surgeon: Sherri Rad, MD;  Location: [facility];  Service: Orthopedics;  Laterality: Left;  left 4th toe amputation through MTP joint, 5th Ray amputation   . Knee surgery  2001  .  Hida scan  06/2014    Normal  . Transthoracic echocardiogram  07/19/14;01/07/15    Mild LV dilation and hypertrophy, EF 35-40%, no wall motion abnormalities, grade II diast dysfxn, bicuspid aortic valve with mild AS and mild AI.  No change on 12/2014 echo.  . Cardiovascular stress test  07/2014    Myoview w/out ischemia but large scar (inferior/apex) w/ global hypokinesis--medical mgmt  . Eye surgery  08/2014    Retinal hemorrhage evacuation  . Coronary angioplasty with stent placement  09/2014    DES to mid LAD and RCA; EF 40% by cardiac MRI  . Cardiac mri  09/2014    Moderately dilated LV.  EF 40%, multiple wall motion abnormalities, normal RV fxn.  Some areas of scarring.  . Left heart catheterization with coronary angiogram N/A 10/10/2014    Procedure:  LEFT HEART CATHETERIZATION WITH CORONARY ANGIOGRAM;  Surgeon: Peter M Swaziland, MD;  Location: [facility] CATH LAB;  Service: Cardiovascular;  Laterality: N/A;  . Left heart catheterization with coronary angiogram N/A 10/15/2014    Procedure: LEFT HEART CATHETERIZATION WITH CORONARY ANGIOGRAM;  Surgeon: Iran Ouch, MD;  Location: [facility] CATH LAB;  Service: Cardiovascular;  Laterality: N/A;  . Carotid dopplers  04/10/15    1-35% bilat; normal subclavians    There were no vitals filed for this visit.  Visit Diagnosis:  Abnormality of gait  Ankle weakness      Subjective Assessment - 06/05/15 1009    Subjective Reports some stiffness today from performing exercises but no pain.    Currently in Pain? No/denies     TherEx- Nustep level 4 x4' UE/LE (HR 82)  Neuro- Heel/Toe Raises on Blue Foam x10 (CGA-Min A with 2 pole assist, very hesitant with Toe Raises and LOB needing assist to recover) Slow March on Blue Foam with 3" hold x10 (CGA with 2 pole assist) Alt Hip Abduction on Blue Foam x10 (CGA with 2 pole assist) Tandem Stance on Blue Balance Beam 4x20" (CGA with HHA on chair PRN) SLS 3x15" (CGA with HHA on PRN)  Alt Toe Tapping to 8" step from Blue Foam (CGA with HHA on PRN)   TherEx- TRX DL Squats Z61 Standing Hip Abduction, Extension, and Flexion with Double Green TB x10 each, bilateral (2 pole assist)         PT Short Term Goals - 06/05/15 1010    PT SHORT TERM GOAL #1   Title pt independent with initial HEP by 06/10/15   Status Achieved           PT Long Term Goals - 06/05/15 1010    PT LONG TERM GOAL #1   Title pt scores 50/56 or better with Berg assessment by 07/13/15   Status On-going   PT LONG TERM GOAL #2   Title pt displays B ankle MMT 4/5 or greater all planes by 07/13/15   Status On-going   PT LONG TERM GOAL #3   Title pt denies limiting level of activity due to fear of falling by 07/13/15   Status On-going   PT LONG TERM GOAL #4   Title pt able to ambulate over  level and uneven terrain with good mechanics without need for AD and distances not limited by fear of falling by 07/13/15   Status On-going               Plan - 06/05/15 1049    Clinical Impression Statement Very difficult with blue foam exercises and pt very unsteady needing CGA  for most all exercises. One instance of LOB requiring Min A to recover. Excellent mechanics with squatting. Pt did experience one bout of vomitting but wanted to continue. This has happened in the past when he beings to workout hard.    PT Next Visit Plan ankle and hip strengthening, gait and balance training   Consulted and Agree with Plan of Care Patient        Problem List Patient Active Problem List   Diagnosis Date Noted  . Carotid bruit present 04/30/2015  . Chronic systolic heart failure 11/03/2014  . CAD (coronary artery disease) 10/24/2014  . Hyperkalemia 10/24/2014  . NSTEMI (non-ST elevated myocardial infarction) 10/20/2014  . Cardiomyopathy, ischemic 10/20/2014  . Anemia due to other cause 10/06/2014  . Liver cirrhosis secondary to NASH 09/18/2014  . Diaphoresis 09/18/2014  . Abnormal finding on EKG 07/19/2014  . Ascites 07/19/2014  . Cardiomyopathy 07/19/2014  . Elevated troponin 07/19/2014  . Dehydration 07/18/2014  . Gastroenteritis 04/14/2014  . Postprandial bloating 04/14/2014  . Chronic renal insufficiency, stage II (mild) 01/12/2014  . History of pancreatitis 10/07/2013  . Diabetes mellitus type 2, uncontrolled, with complications 01/07/2012  . Cirrhosis 11/07/2011  . Peripheral neuropathy 09/12/2011  . Thrombocytopenia 07/14/2011  . HTN (hypertension), benign 06/30/2011    Ronney Lion, PTA  06/05/2015, 10:51 AM  [facility] [address]  [address] [place], [place], [address] Phone: [phone]   Fax:  [phone]

## 2015-06-09 ENCOUNTER — Encounter: Payer: Self-pay | Admitting: Internal Medicine

## 2015-06-09 ENCOUNTER — Ambulatory Visit: Payer: 59 | Admitting: Physical Therapy

## 2015-06-09 DIAGNOSIS — R269 Unspecified abnormalities of gait and mobility: Secondary | ICD-10-CM

## 2015-06-09 DIAGNOSIS — R29898 Other symptoms and signs involving the musculoskeletal system: Secondary | ICD-10-CM

## 2015-06-09 NOTE — Therapy (Signed)
[facility]patient [facility] [address]  [address] [place], [place], [address] Phone: [phone]   Fax:  [phone]  Physical Therapy Treatment  Patient Details  Name: [name] MRN: [id number] Date of Birth: 1961-02-15 Referring Provider:  [name], MD  Encounter Date: 06/09/2015      PT End of Session - 06/09/15 1052    Visit Number 3   Number of Visits 12   Date for PT Re-Evaluation 07/13/15   PT Start Time 1009   PT Stop Time 1050   PT Time Calculation (min) 41 min   Activity Tolerance Patient tolerated treatment well   Behavior During Therapy [facility] for tasks assessed/performed      Past Medical History  Diagnosis Date  . Heart murmur     ECHO 07/2011 showed mild AS and LVH.  TEE 09/2011 showed small PFO, bicuspid aortic valve but no stenosis or regurg.  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  Gen surg and GI recommended flagyl and cipro x 2 wks..  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  . Cholelithiasis     u/s--no cholecystitis.  06/2014 u/s showed no stones, only GB sludge  . Peripheral neuropathy     Diabetic:  Autonomic (pelvic) and LE polyneuropathy--[facility] testing showed that it is likely from DM  . Diabetic foot ulcers     Poor healing; normal ABIs and TBIs  . Arthritis     back L3 - L5  . Hyperactive gag reflex   . History of pyelonephritis   . Type II or unspecified type diabetes mellitus with neurological manifestations, not stated as uncontrolled     IDDM: neuro, renal, and ophth complications  . Wears dentures     upper  . Osteomyelitis of toe of left foot     left 4th and 5th toes--now s/p amputation of these areas  . Diabetic retinopathy associated with type 2 diabetes mellitus     Laser tx in both eyes (Dr. Allyne Gee)  . Diabetic  nephropathy     Proteinuria and CrCl 55-65  . Chronic renal insufficiency, stage III (moderate)     CrCl 50s-80s 2014 to pre-VFIB arrest 10/02/14, at which time he sustained AKI.  . Lumbar spondylosis     MRI 08/25/11 with multilevel facet dz, small disc protrusion at L5-S1 to the right without impingement  . Ischemic cardiomyopathy 07/22/14    Myoview w/out ischemia but large scar (inferior/apex) w/ global hypokinesis--medical mgmt  . History of pancreatitis 2014 and 2015    Suspected biliary: passing small gallstones: surgery declined to remove GB 07/2014.  . Aspiration pneumonia   . Thrombocytopenia   . Portal hypertension   . Liver cirrhosis secondary to NASH     ?+ alcohol?.  With hx of splenomegaly and ascities (dx approx 2012)  . History of gram negative sepsis 2015    e coli  . Ventricular fibrillation 10/02/14    Resuscitated, hospitalized, 3 V CAD detected, DES stent to mid LAD and to RCA were placed.    Past Surgical History  Procedure Laterality Date  . Knee arthroscopy  2001    Bilateral  . Amputation  10/24/2012    Procedure: AMPUTATION RAY;  Surgeon: Sherri Rad, MD;  Location: [facility];  Service: Orthopedics;  Laterality: Left;  left 4th toe amputation through MTP joint, 5th Ray amputation   . Knee surgery  2001  . Hida scan  06/2014    Normal  . Transthoracic echocardiogram  07/19/14;01/07/15    Mild LV dilation and hypertrophy, EF 35-40%, no wall motion abnormalities, grade II diast dysfxn, bicuspid aortic valve with mild AS and mild AI.  No change on 12/2014 echo.  . Cardiovascular stress test  07/2014    Myoview w/out ischemia but large scar (inferior/apex) w/ global hypokinesis--medical mgmt  . Eye surgery  08/2014    Retinal hemorrhage evacuation  . Coronary angioplasty with stent placement  09/2014    DES to mid LAD and RCA; EF 40% by cardiac MRI  . Cardiac mri  09/2014    Moderately dilated LV.  EF 40%, multiple wall motion abnormalities, normal  RV fxn.  Some areas of scarring.  . Left heart catheterization with coronary angiogram N/A 10/10/2014    Procedure: LEFT HEART CATHETERIZATION WITH CORONARY ANGIOGRAM;  Surgeon: Peter M Swaziland, MD;  Location: [facility] CATH LAB;  Service: Cardiovascular;  Laterality: N/A;  . Left heart catheterization with coronary angiogram N/A 10/15/2014    Procedure: LEFT HEART CATHETERIZATION WITH CORONARY ANGIOGRAM;  Surgeon: Iran Ouch, MD;  Location: [facility] CATH LAB;  Service: Cardiovascular;  Laterality: N/A;  . Carotid dopplers  04/10/15    1-35% bilat; normal subclavians    There were no vitals filed for this visit.  Visit Diagnosis:  Abnormality of gait  Ankle weakness      Subjective Assessment - 06/09/15 1011    Subjective feeling a little tired today; but overall feeling good   Patient Stated Goals get back to normal   Currently in Pain? No/denies                         [facility] Adult PT Treatment/Exercise - 06/09/15 1012    Knee/Hip Exercises: Aerobic   Nustep Level 5 x 6 min             Balance Exercises - 06/09/15 1019    Balance Exercises: Standing   Standing Eyes Opened Head turns;Narrow base of support (BOS);Foam/compliant surface  x 10 horizontal/vertical   Standing Eyes Closed Wide (BOA);Head turns;Foam/compliant surface;5 reps;10 secs  static standing 5x10 sec; horizontal/vertical x 10   SLS Eyes open;Foam/compliant surface;Upper extremity support 2;5 reps;10 secs   Heel Raises Limitations x20 on compliant surface with 2 pole support   Toe Raise Limitations x20 on compliant surface; increased difficulty with dorsiflexion   Sit to Stand Time 2x10 from compliant surface without UE support   Other Standing Exercises single tap/double tap to cones with min A and LOB x 2             PT Short Term Goals - 06/05/15 1010    PT SHORT TERM GOAL #1   Title pt independent with initial HEP by 06/10/15   Status Achieved           PT Long Term Goals -  06/05/15 1010    PT LONG TERM GOAL #1   Title pt scores 50/56 or better with Berg assessment by 07/13/15   Status On-going   PT LONG TERM GOAL #2   Title pt displays B ankle MMT 4/5 or greater all planes by 07/13/15   Status On-going   PT LONG TERM GOAL #3   Title pt denies limiting level of activity due to fear of falling by 07/13/15  Status On-going   PT LONG TERM GOAL #4   Title pt able to ambulate over level and uneven terrain with good mechanics without need for AD and distances not limited by fear of falling by 07/13/15   Status On-going               Plan - 06/09/15 1053    Clinical Impression Statement Pt continues to demonstrate difficulty with compliant surface and single limb stance activities.  Will continue to benefit from PT to maximize function and decrease fall risk.   PT Next Visit Plan ankle and hip strengthening, gait and balance training   Consulted and Agree with Plan of Care Patient        Problem List Patient Active Problem List   Diagnosis Date Noted  . Carotid bruit present 04/30/2015  . Chronic systolic heart failure 11/03/2014  . CAD (coronary artery disease) 10/24/2014  . Hyperkalemia 10/24/2014  . NSTEMI (non-ST elevated myocardial infarction) 10/20/2014  . Cardiomyopathy, ischemic 10/20/2014  . Anemia due to other cause 10/06/2014  . Liver cirrhosis secondary to NASH 09/18/2014  . Diaphoresis 09/18/2014  . Abnormal finding on EKG 07/19/2014  . Ascites 07/19/2014  . Cardiomyopathy 07/19/2014  . Elevated troponin 07/19/2014  . Dehydration 07/18/2014  . Gastroenteritis 04/14/2014  . Postprandial bloating 04/14/2014  . Chronic renal insufficiency, stage II (mild) 01/12/2014  . History of pancreatitis 10/07/2013  . Diabetes mellitus type 2, uncontrolled, with complications 01/07/2012  . Cirrhosis 11/07/2011  . Peripheral neuropathy 09/12/2011  . Thrombocytopenia 07/14/2011  . HTN (hypertension), benign 06/30/2011   Clarita Crane,  PT, DPT 06/09/2015 10:54 AM  [facility] [address]  [address] [place], [place], [address] Phone: [phone]   Fax:  [phone]

## 2015-06-11 ENCOUNTER — Ambulatory Visit: Payer: 59 | Admitting: Rehabilitation

## 2015-06-11 DIAGNOSIS — R29898 Other symptoms and signs involving the musculoskeletal system: Secondary | ICD-10-CM

## 2015-06-11 DIAGNOSIS — R269 Unspecified abnormalities of gait and mobility: Secondary | ICD-10-CM | POA: Diagnosis not present

## 2015-06-11 NOTE — Therapy (Signed)
[facility] [address]  [place], [place], [address] Phone: [phone]   Fax:  [phone]  Physical Therapy Treatment  Patient Details  Name: [name] MRN: [id number] Date of Birth: 07/27/1961 Referring Provider:  Larey Dresser, MD  Encounter Date: 06/11/2015      PT End of Session - 06/11/15 1013    Visit Number 4   Number of Visits 12   Date for PT Re-Evaluation 07/13/15   PT Start Time 1010   PT Stop Time 1050   PT Time Calculation (min) 40 min   Activity Tolerance Patient tolerated treatment well   Behavior During Therapy [facility] for tasks assessed/performed      Past Medical History  Diagnosis Date  . Heart murmur     ECHO 07/2011 showed mild AS and LVH.  TEE 09/2011 showed small PFO, bicuspid aortic valve but no stenosis or regurg.  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  Gen surg and GI recommended flagyl and cipro x 2 wks..  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  . Cholelithiasis     u/s--no cholecystitis.  06/2014 u/s showed no stones, only GB sludge  . Peripheral neuropathy     Diabetic:  Autonomic (pelvic) and LE polyneuropathy--[facility] testing showed that it is likely from DM  . Diabetic foot ulcers     Poor healing; normal ABIs and TBIs  . Arthritis     back L3 - L5  . Hyperactive gag reflex   . History of pyelonephritis   . Type II or unspecified type diabetes mellitus with neurological manifestations, not stated as uncontrolled     IDDM: neuro, renal, and ophth complications  . Wears dentures     upper  . Osteomyelitis of toe of left foot     left 4th and 5th toes--now s/p amputation of these areas  . Diabetic retinopathy associated with type 2 diabetes mellitus     Laser tx in both eyes (Dr. Baird Cancer)  . Diabetic  nephropathy     Proteinuria and CrCl 55-65  . Chronic renal insufficiency, stage III (moderate)     CrCl 50s-80s 2014 to pre-VFIB arrest 10/02/14, at which time he sustained AKI.  . Lumbar spondylosis     MRI 08/25/11 with multilevel facet dz, small disc protrusion at L5-S1 to the right without impingement  . Ischemic cardiomyopathy 07/22/14    Myoview w/out ischemia but large scar (inferior/apex) w/ global hypokinesis--medical mgmt  . History of pancreatitis 2014 and 2015    Suspected biliary: passing small gallstones: surgery declined to remove GB 07/2014.  . Aspiration pneumonia   . Thrombocytopenia   . Portal hypertension   . Liver cirrhosis secondary to NASH     ?+ alcohol?.  With hx of splenomegaly and ascities (dx approx 2012)  . History of gram negative sepsis 2015    e coli  . Ventricular fibrillation 10/02/14    Resuscitated, hospitalized, 3 V CAD detected, DES stent to mid LAD and to RCA were placed.    Past Surgical History  Procedure Laterality Date  . Knee arthroscopy  2001    Bilateral  . Amputation  10/24/2012    Procedure: AMPUTATION RAY;  Surgeon: Colin Rhein, MD;  Location: [facility];  Service: Orthopedics;  Laterality: Left;  left 4th toe amputation through MTP joint, 5th Ray amputation   . Knee surgery  2001  . Hida scan  06/2014    Normal  . Transthoracic echocardiogram  07/19/14;01/07/15    Mild LV dilation and hypertrophy, EF 35-40%, no wall motion abnormalities, grade II diast dysfxn, bicuspid aortic valve with mild AS and mild AI.  No change on 12/2014 echo.  . Cardiovascular stress test  07/2014    Myoview w/out ischemia but large scar (inferior/apex) w/ global hypokinesis--medical mgmt  . Eye surgery  08/2014    Retinal hemorrhage evacuation  . Coronary angioplasty with stent placement  09/2014    DES to mid LAD and RCA; EF 40% by cardiac MRI  . Cardiac mri  09/2014    Moderately dilated LV.  EF 40%, multiple wall motion abnormalities, normal  RV fxn.  Some areas of scarring.  . Left heart catheterization with coronary angiogram N/A 10/10/2014    Procedure: LEFT HEART CATHETERIZATION WITH CORONARY ANGIOGRAM;  Surgeon: Peter M Martinique, MD;  Location: [facility] CATH LAB;  Service: Cardiovascular;  Laterality: N/A;  . Left heart catheterization with coronary angiogram N/A 10/15/2014    Procedure: LEFT HEART CATHETERIZATION WITH CORONARY ANGIOGRAM;  Surgeon: Wellington Hampshire, MD;  Location: [facility] CATH LAB;  Service: Cardiovascular;  Laterality: N/A;  . Carotid dopplers  04/10/15    1-35% bilat; normal subclavians    There were no vitals filed for this visit.  Visit Diagnosis:  Abnormality of gait  Ankle weakness      Subjective Assessment - 06/11/15 1013    Subjective Reports he is very sore today from doing a lot of yardwork yesterday. Also reports he sees his cardiologist tomorrow.    Currently in Pain? No/denies     TODAY'S TREATMENT TherEx- Nustep level 4 x6' UE/LE (103 HR)  MMT/SLS  Neuro- Heel/Toe Raises on Blue Foam x20 (0-1 HHA on counter as needed, CGA) Toe Tapping to 8" Step from Blue Foam x10 (0-1 HHA on counter, CGA) DL Squats on Blue Foam x15   Side-Stepping on Blue Balance Beam x3 (down and back=1) Tandem Stance on Blue Balance Beam 8x20" (CGA-Min A) Narrow Standing on Blue Foam EC 5x5-10" (CGA-Min A) Narrow Standing on Blue Foam with Head Turns (up/down, side/side) x10 each (SBA-CGA)       [facility] PT Assessment - 06/11/15 1016    Strength   Right/Left Ankle Right;Left   Right Ankle Dorsiflexion 4/5   Right Ankle Inversion 4+/5   Right Ankle Eversion 4/5   Left Ankle Dorsiflexion 4-/5   Left Ankle Inversion 3+/5   Left Ankle Eversion 3+/5   Balance   Balance Assessed --   High Level Balance   High Level Balance Comments SLS: Pt able to hold Rt LE 4-10", Lt LE 3-5" (each leg with 5 attempts          PT Short Term Goals - 06/05/15 1010    PT SHORT TERM GOAL #1   Title pt independent with initial HEP by  06/10/15   Status Achieved           PT Long Term Goals - 06/05/15 1010    PT LONG TERM GOAL #1   Title pt scores 50/56 or better with Berg assessment by 07/13/15   Status On-going   PT LONG TERM GOAL #2   Title pt displays B ankle MMT 4/5 or greater all planes by 07/13/15   Status On-going   PT LONG TERM GOAL #3  Title pt denies limiting level of activity due to fear of falling by 07/13/15   Status On-going   PT LONG TERM GOAL #4   Title pt able to ambulate over level and uneven terrain with good mechanics without need for AD and distances not limited by fear of falling by 07/13/15   Status On-going               Plan - 06/11/15 1052    Clinical Impression Statement Some improvements noted with starting therapy but pt has only been for 4 visit. Ankle MMT is still limited on the Lt and noted improvements with Rt PF. Currently no goals have been met but feel as though pt will improve with therapy.    PT Next Visit Plan ankle and hip strengthening, gait and balance training   Consulted and Agree with Plan of Care Patient        Problem List Patient Active Problem List   Diagnosis Date Noted  . Carotid bruit present 04/30/2015  . Chronic systolic heart failure 49/32/4199  . CAD (coronary artery disease) 10/24/2014  . Hyperkalemia 10/24/2014  . NSTEMI (non-ST elevated myocardial infarction) 10/20/2014  . Cardiomyopathy, ischemic 10/20/2014  . Anemia due to other cause 10/06/2014  . Liver cirrhosis secondary to NASH 09/18/2014  . Diaphoresis 09/18/2014  . Abnormal finding on EKG 07/19/2014  . Ascites 07/19/2014  . Cardiomyopathy 07/19/2014  . Elevated troponin 07/19/2014  . Dehydration 07/18/2014  . Gastroenteritis 04/14/2014  . Postprandial bloating 04/14/2014  . Chronic renal insufficiency, stage II (mild) 01/12/2014  . History of pancreatitis 10/07/2013  . Diabetes mellitus type 2, uncontrolled, with complications 14/44/5848  . Cirrhosis 11/07/2011  .  Peripheral neuropathy 09/12/2011  . Thrombocytopenia 07/14/2011  . HTN (hypertension), benign 06/30/2011    Barbette Hair 06/11/2015, 10:59 AM  [facility] [address]  [place], [place], [address] Phone: [phone]   Fax:  [phone]

## 2015-06-12 ENCOUNTER — Ambulatory Visit (HOSPITAL_COMMUNITY)
Admission: RE | Admit: 2015-06-12 | Discharge: 2015-06-12 | Disposition: A | Discharge status: Home / Self Care | Payer: 59 | Source: Ambulatory Visit | Attending: Cardiology | Admitting: Cardiology

## 2015-06-12 VITALS — BP 124/68 | HR 56 | Wt 229.2 lb

## 2015-06-12 DIAGNOSIS — E119 Type 2 diabetes mellitus without complications: Secondary | ICD-10-CM | POA: Diagnosis not present

## 2015-06-12 DIAGNOSIS — Z8674 Personal history of sudden cardiac arrest: Secondary | ICD-10-CM | POA: Diagnosis not present

## 2015-06-12 DIAGNOSIS — Z79899 Other long term (current) drug therapy: Secondary | ICD-10-CM | POA: Insufficient documentation

## 2015-06-12 DIAGNOSIS — Z794 Long term (current) use of insulin: Secondary | ICD-10-CM | POA: Diagnosis not present

## 2015-06-12 DIAGNOSIS — E1140 Type 2 diabetes mellitus with diabetic neuropathy, unspecified: Secondary | ICD-10-CM | POA: Diagnosis not present

## 2015-06-12 DIAGNOSIS — K7460 Unspecified cirrhosis of liver: Secondary | ICD-10-CM | POA: Diagnosis not present

## 2015-06-12 DIAGNOSIS — I5022 Chronic systolic (congestive) heart failure: Secondary | ICD-10-CM | POA: Diagnosis present

## 2015-06-12 DIAGNOSIS — Z7982 Long term (current) use of aspirin: Secondary | ICD-10-CM | POA: Diagnosis not present

## 2015-06-12 DIAGNOSIS — I255 Ischemic cardiomyopathy: Secondary | ICD-10-CM | POA: Diagnosis not present

## 2015-06-12 DIAGNOSIS — I2510 Atherosclerotic heart disease of native coronary artery without angina pectoris: Secondary | ICD-10-CM

## 2015-06-12 DIAGNOSIS — R2689 Other abnormalities of gait and mobility: Secondary | ICD-10-CM | POA: Diagnosis not present

## 2015-06-12 DIAGNOSIS — I6522 Occlusion and stenosis of left carotid artery: Secondary | ICD-10-CM | POA: Diagnosis not present

## 2015-06-12 DIAGNOSIS — R161 Splenomegaly, not elsewhere classified: Secondary | ICD-10-CM | POA: Insufficient documentation

## 2015-06-12 DIAGNOSIS — Z7902 Long term (current) use of antithrombotics/antiplatelets: Secondary | ICD-10-CM | POA: Diagnosis not present

## 2015-06-12 DIAGNOSIS — D696 Thrombocytopenia, unspecified: Secondary | ICD-10-CM | POA: Insufficient documentation

## 2015-06-12 LAB — CBC
HEMATOCRIT: 34.9 % — AB (ref 39.0–52.0)
Hemoglobin: 11.8 g/dL — ABNORMAL LOW (ref 13.0–17.0)
MCH: 29.8 pg (ref 26.0–34.0)
MCHC: 33.8 g/dL (ref 30.0–36.0)
MCV: 88.1 fL (ref 78.0–100.0)
Platelets: 41 10*3/uL — ABNORMAL LOW (ref 150–400)
RBC: 3.96 MIL/uL — ABNORMAL LOW (ref 4.22–5.81)
RDW: 12.5 % (ref 11.5–15.5)
WBC: 5.4 10*3/uL (ref 4.0–10.5)

## 2015-06-12 LAB — BASIC METABOLIC PANEL
Anion gap: 6 (ref 5–15)
BUN: 41 mg/dL — AB (ref 6–20)
CO2: 26 mmol/L (ref 22–32)
Calcium: 8.9 mg/dL (ref 8.9–10.3)
Chloride: 108 mmol/L (ref 101–111)
Creatinine, Ser: 1.39 mg/dL — ABNORMAL HIGH (ref 0.61–1.24)
GFR calc Af Amer: >60 mL/min (ref >60)
GFR, EST NON AFRICAN AMERICAN: 57 mL/min — AB (ref >60)
GLUCOSE: 150 mg/dL — AB (ref 65–99)
Potassium: 5 mmol/L (ref 3.5–5.1)
Sodium: 140 mmol/L (ref 135–145)

## 2015-06-12 MED ORDER — PATIROMER SORBITEX CALCIUM 8.4 G PO PACK: 8.4000 g | PACK | Freq: Every day | ORAL | Status: DC

## 2015-06-12 MED ORDER — LISINOPRIL 5 MG PO TABS: 5.0000 mg | ORAL_TABLET | Freq: Every day | ORAL | Status: DC

## 2015-06-12 NOTE — Patient Instructions (Addendum)
Start New Medication:  Veltassa 8.4 grams, this is a powder that you need to mix with 3 ounces of water to take, other important informations:  -DO NOT take at the same time as you take your other medications, best to take this at night with a light snack  -DO take with food, or light snack  -DO NOT heat or microwave VELTASSA or add to heated foods or liquids  -DO NOT take in dry form  Paperwork for the new medication has been sent to the company, they will contact you and ship you a 15 day supply free while we are getting it authorized by your insurance company  Once you get the new medication and start taking it we would like you to also START Lisinopril 5 mg daily the day after you start Veltassa  Labs 1 week after starting medications  We will contact you in 3 months to schedule your next appointment.

## 2015-06-12 NOTE — Progress Notes (Signed)
Patient ID: [name], male   DOB: [birth year], 54 y.o.   MRN: [id number] PCP: Dr. [name]  54 y.o. with history of CAD s/p recent PCI, cirrhosis with thrombocytopenia, cardiac arrest, and ischemic cardiomyopathy.  Patient was admitted in 7/15-8/15 with gallstone pancreatitis.  It was decided not to do a cholecystectomy due to high surgical risk.  He was sent home and went back to work.  In 10/15, he had a cardiac arrest at work and was defibrillated by AED.  He was cooled and recovered well.  Platelets were initially very low but recovered to his baseline in the 60K range.  Cardiac cath was done showing severe 2 vessel disease.  Cardiac MRI was done, showing EF 40% with a mixed viability picture.  With recovery of platelets, he had Xience DES to the LAD and the distal RCA.  He had acute on chronic systolic CHF with volume overload and was diuresed and eventually discharge.  Unfortunately, he was sent home on daily metolazone and developed AKI.  Diuretics were held and this has gradually improved.  Spironolactone and lisinopril have been stopped due to hyperkalemia.     He returns today for followup.  Says PT for balance is going well.  Did have a fall with a R elbow hematoma but no complications.  Did have some dyspnea with digging holes in his yard and walking up hills, but otherwise has no problem. Taking Lasix regularly.  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  No chest pain, no palpitations.      Labs (11/15): K 4.7 => 7.2, creatinine 1.37 => 2.1 => 2.25 with BUN 94, HCT 26.9, plts 67, P2Y12 241 Labs (11/20/14): K 6.2, creatinine 1.1, HCT 33.2, plts 45 Labs (12/18/14): K 6.1 creatinine 1.07 off spiro and lisinopril.  Labs (1/16): K 5.2, creatinine 1.12 Labs (2/16): LDL 59, HDL 41, HCT 32.4 Labs (3/16): K 5.4, creatinine 1.27 Labs (4/16): HCT 32.9, plts 47 Labs (5/16): K 5.2, creatinine 1.23, BNP 352  PMH: 1. 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  2.  Thrombocytopenia: Chronic.  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   3. Gallstone pancreatitis: He did not have cholecystectomy as deemed too high a surgical risk.  4. CKD 5. Type II diabetes with diabetic neuropathy 6. Bicuspid aortic valve: With no AS and mild AI on echo in 1/16.   7. H/o perforated diverticulum.  8. CAD: Cardiac arrest 10/15.  LHC (10/15) with 80-90% proximal ramus stenosis, 90% mLAD, subtotaled distal LAD, 90% D1, 90% dRCA/proximal PDA.  Patient had Xience DES to mLAD, dRCA.  9. Cardiac arrest: 10/15, defibrillated with AED and cooled.  10. Ischemic cardiomyopathy: Echo (8/15) with EF 35-40%, bicuspid aortic valve with mild AS/AI.  Cardiac MRI (10/15) with EF 40%, regional wall motion abnormalities, normal RV, delayed enhancement pattern suggesting mixed picture of viability. Echo (1/16) with EF 35-40%, hypokinesis of the mid to apical septum and the true apex, bicuspid aortic valve, mild AI.  11. Carotid dopplers (4/16): Mild plaque  SH: Never smoked.  Prior ETOH abuse, now stopped.  Works at [employer] and [employer].  Lives with sister in [place].   FH: No premature CAD.   ROS: All systems reviewed and negative except as per HPI.   Current Outpatient Prescriptions  Medication Sig Dispense Refill  . aspirin 81 MG chewable tablet Chew 1 tablet (81 mg total) by mouth daily.    [name] atorvastatin (LIPITOR) 20 MG tablet Take  1 tablet (20 mg total) by mouth daily at 6 PM. 30 tablet 3  . carvedilol (COREG) 25 MG tablet Take 1 tablet (25 mg total) by mouth 2 (two) times daily with a meal. 60 tablet 6  . clopidogrel (PLAVIX) 75 MG tablet Take 1 tablet (75 mg total) by mouth daily. 30 tablet 3  . furosemide (LASIX) 40 MG tablet Take 1 tablet (40 mg total) by mouth daily. Hold if weight is 225 lb or less 60 tablet 6  . glucose blood (FREESTYLE TEST STRIPS) test strip Use as instructed 100 each 6  . hydrALAZINE (APRESOLINE) 25 MG tablet Take 1 tablet (25 mg total) by  mouth 3 (three) times daily. 90 tablet 3  . insulin aspart (NOVOLOG) 100 UNIT/ML injection Inject 0-10 Units into the skin 3 (three) times daily before meals. Sliding scale    . Insulin Degludec (TRESIBA FLEXTOUCH) 100 UNIT/ML SOPN Inject 18 Units into the skin at bedtime. 5 pen 2  . Insulin Pen Needle 32G X 6 MM MISC Use for insulin injections 4 times per day 150 each 12  . isosorbide mononitrate (IMDUR) 30 MG 24 hr tablet TAKE 1 TABLET (30 MG TOTAL) BY MOUTH DAILY. 30 tablet 3  . lidocaine (LIDODERM) 5 % Place 1 patch onto the skin daily. Remove & Discard patch within 12 hours or as directed by MD 30 patch 0  . nitroGLYCERIN (NITROSTAT) 0.4 MG SL tablet Place 1 tablet (0.4 mg total) under the tongue every 5 (five) minutes as needed for chest pain. 25 tablet 0   No current facility-administered medications for this encounter.   BP 124/68 mmHg  Pulse 56  Wt 229 lb 4 oz (103.987 kg)  SpO2 97% General: NAD Neck: JVD 7, no thyromegaly or thyroid nodule.  Lungs: Clear   CV: Nondisplaced PMI.  Heart regular S1/S2, no S3/S4, 2/6 early SEM RUSB. Trace ankle edema.  Left carotid bruit.  Normal pedal pulses.  Abdomen: Soft, nontender, no hepatosplenomegaly, no distention.  Skin: Intact without lesions or rashes.  Neurologic: Alert and oriented x 3.  Psych: Normal affect. Extremities: No clubbing or cyanosis.  HEENT: Normal.   Assessment/Plan: 1. Chronic systolic CHF: Echo 03/2015 with EF 40-45%, ischemic cardiomyopathy.  NYHA class II symptoms. No volume overload on exam today.  - Continue Lasix 40 mg daily and keep sodium in diet low.  - Will try to control hyperkalemia with Veltassa 8.4 g/day and add lisinopril 5 mg daily with BMET today and again in 1 week. - Continue hydralazine/Imdur.   - Continue Coreg to 25 mg bid.  2. CAD: Status post Xience DES to LAD and RCA after cardiac arrest.  Will need to follow closely due to bleeding risk on DAPT with chronic thrombocytopenia.   He is currently  on both ASA 81 and Plavix as P2Y12 test has not shown robust platelet inhibition with Plavix (most recently 241 PRU).  No evidence for bleeding.  - Continue statin - Continue ASA 81 and Plavix. Stop Plavix in 10/16.  3. Cirrhosis: Likely related to ETOH, no longer drinking.  Has associated splenomegaly and chronic thrombocytopenia.  4. Thrombocytopenia: Chronic, likely due to splenomegaly. Will need to follow plts/hemoglobin over time closely.  CBC today.   5. DM: Per endocrinology.  6. Hyperkalemia: Off ACEI and spironolactone.    - Will try to put him on Veltassa to see if this allows [place] to start ACEI.  7. Carotid bruit: On left,  carotid dopplers with mild plaque. Will  need follow up in 4/18.  8. Imbalance: May be related to diabetic neuropathy. Still worried about balance and doing his job.  Is going to PT for same.  Follow up in 3 months. Labs today and Follow up labs in 7 days after veltessa start  Marca Ancona 06/13/2015

## 2015-06-16 ENCOUNTER — Ambulatory Visit: Payer: 59 | Admitting: Rehabilitation

## 2015-06-16 DIAGNOSIS — R269 Unspecified abnormalities of gait and mobility: Secondary | ICD-10-CM

## 2015-06-16 DIAGNOSIS — R29898 Other symptoms and signs involving the musculoskeletal system: Secondary | ICD-10-CM

## 2015-06-16 NOTE — Therapy (Signed)
[facility] [address]  [address] [place], [place], [address] Phone: [phone]   Fax:  [phone]  Physical Therapy Treatment  Patient Details  Name: [name] MRN: [id number] Date of Birth: 1961/04/01 Referring Provider:  Laurey Morale, MD  Encounter Date: 06/16/2015      PT End of Session - 06/16/15 1013    Visit Number 5   Number of Visits 12   Date for PT Re-Evaluation 07/13/15   PT Start Time 1015   PT Stop Time 1058   PT Time Calculation (min) 43 min   Activity Tolerance Patient tolerated treatment well   Behavior During Therapy [facility] for tasks assessed/performed      Past Medical History  Diagnosis Date  . Heart murmur     ECHO 07/2011 showed mild AS and LVH.  TEE 09/2011 showed small PFO, bicuspid aortic valve but no stenosis or regurg.  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  Gen surg and GI recommended flagyl and cipro x 2 wks..  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  . Cholelithiasis     u/s--no cholecystitis.  06/2014 u/s showed no stones, only GB sludge  . Peripheral neuropathy     Diabetic:  Autonomic (pelvic) and LE polyneuropathy--[facility] testing showed that it is likely from DM  . Diabetic foot ulcers     Poor healing; normal ABIs and TBIs  . Arthritis     back L3 - L5  . Hyperactive gag reflex   . History of pyelonephritis   . Type II or unspecified type diabetes mellitus with neurological manifestations, not stated as uncontrolled     IDDM: neuro, renal, and ophth complications  . Wears dentures     upper  . Osteomyelitis of toe of left foot     left 4th and 5th toes--now s/p amputation of these areas  . Diabetic retinopathy associated with type 2 diabetes mellitus     Laser tx in both eyes (Dr. Allyne Gee)  . Diabetic  nephropathy     Proteinuria and CrCl 55-65  . Chronic renal insufficiency, stage III (moderate)     CrCl 50s-80s 2014 to pre-VFIB arrest 10/02/14, at which time he sustained AKI.  . Lumbar spondylosis     MRI 08/25/11 with multilevel facet dz, small disc protrusion at L5-S1 to the right without impingement  . Ischemic cardiomyopathy 07/22/14    Myoview w/out ischemia but large scar (inferior/apex) w/ global hypokinesis--medical mgmt  . History of pancreatitis 2014 and 2015    Suspected biliary: passing small gallstones: surgery declined to remove GB 07/2014.  . Aspiration pneumonia   . Thrombocytopenia   . Portal hypertension   . Liver cirrhosis secondary to NASH     ?+ alcohol?.  With hx of splenomegaly and ascities (dx approx 2012)  . History of gram negative sepsis 2015    e coli  . Ventricular fibrillation 10/02/14    Resuscitated, hospitalized, 3 V CAD detected, DES stent to mid LAD and to RCA were placed.    Past Surgical History  Procedure Laterality Date  . Knee arthroscopy  2001    Bilateral  . Amputation  10/24/2012    Procedure: AMPUTATION RAY;  Surgeon: Sherri Rad, MD;  Location: [facility];  Service: Orthopedics;  Laterality: Left;  left 4th toe amputation through MTP joint, 5th Ray amputation   . Knee surgery  2001  . Hida scan  06/2014    Normal  . Transthoracic echocardiogram  07/19/14;01/07/15    Mild LV dilation and hypertrophy, EF 35-40%, no wall motion abnormalities, grade II diast dysfxn, bicuspid aortic valve with mild AS and mild AI.  No change on 12/2014 echo.  . Cardiovascular stress test  07/2014    Myoview w/out ischemia but large scar (inferior/apex) w/ global hypokinesis--medical mgmt  . Eye surgery  08/2014    Retinal hemorrhage evacuation  . Coronary angioplasty with stent placement  09/2014    DES to mid LAD and RCA; EF 40% by cardiac MRI  . Cardiac mri  09/2014    Moderately dilated LV.  EF 40%, multiple wall motion abnormalities, normal  RV fxn.  Some areas of scarring.  . Left heart catheterization with coronary angiogram N/A 10/10/2014    Procedure: LEFT HEART CATHETERIZATION WITH CORONARY ANGIOGRAM;  Surgeon: Peter M Swaziland, MD;  Location: [facility] CATH LAB;  Service: Cardiovascular;  Laterality: N/A;  . Left heart catheterization with coronary angiogram N/A 10/15/2014    Procedure: LEFT HEART CATHETERIZATION WITH CORONARY ANGIOGRAM;  Surgeon: Iran Ouch, MD;  Location: [facility] CATH LAB;  Service: Cardiovascular;  Laterality: N/A;  . Carotid dopplers  04/10/15    1-35% bilat; normal subclavians    There were no vitals filed for this visit.  Visit Diagnosis:  Ankle weakness  Abnormality of gait      Subjective Assessment - 06/16/15 1017    Subjective Reports MD put him on new medication (already entered into system) and stated that if physical therapy thinks he can do his job, then he can go back to work. But MD does believe there is still a balance deficit. States that he has been doing more walking at the mall when he can.    Currently in Pain? No/denies      TODAY'S TREATMENT TherEx- Nustep level 4 x6' UE/LE (93 HR) Fwd/Bwd walking with cook band 176ft x1 each way  Neuro-Toe Tapping to 8" Step from Blue Foam x15 (0 HHA, CGA)  Heel/Toe Raises on Blue Foam x20 (0-1 HHA on counter as needed, CGA) Lateral Step ups/over onto Blue Foam x10   DL Squats on Blue Foam S96  Side-stepping on Blue Balance Beam x4 (down and back=1) with pt keeping eyes forward (very hard with several occasions of LOB, CGA-Min A) Tandem Stand on Blue Foam 2x20" each (HHA on poles PRN, CGA)  TherEx- Seated 4 way ankle Green TB x10 each 6" Alt Step ups x15       PT Short Term Goals - 06/05/15 1010    PT SHORT TERM GOAL #1   Title pt independent with initial HEP by 06/10/15   Status Achieved           PT Long Term Goals - 06/05/15 1010    PT LONG TERM GOAL #1   Title pt scores 50/56 or better with Berg assessment by 07/13/15   Status  On-going   PT LONG TERM GOAL #2   Title pt displays B ankle MMT 4/5 or greater all planes by 07/13/15   Status On-going   PT LONG TERM GOAL #3   Title pt denies limiting level of activity due to fear of falling by 07/13/15   Status On-going   PT LONG TERM GOAL #4   Title pt able to ambulate over level and uneven terrain  with good mechanics without need for AD and distances not limited by fear of falling by 07/13/15   Status On-going               Plan - 06/16/15 1058    Clinical Impression Statement Very difficult time with side-stepping while trying to keep his head up but good tolerance with fwd and bwd walking. Pt was hesitant with bwd walking but able to complete well with small steps.    PT Next Visit Plan ankle and hip strengthening, gait and balance training   Consulted and Agree with Plan of Care Patient        Problem List Patient Active Problem List   Diagnosis Date Noted  . Carotid bruit present 04/30/2015  . Chronic systolic heart failure 11/03/2014  . CAD (coronary artery disease) 10/24/2014  . Hyperkalemia 10/24/2014  . NSTEMI (non-ST elevated myocardial infarction) 10/20/2014  . Cardiomyopathy, ischemic 10/20/2014  . Anemia due to other cause 10/06/2014  . Liver cirrhosis secondary to NASH 09/18/2014  . Diaphoresis 09/18/2014  . Abnormal finding on EKG 07/19/2014  . Ascites 07/19/2014  . Cardiomyopathy 07/19/2014  . Elevated troponin 07/19/2014  . Dehydration 07/18/2014  . Gastroenteritis 04/14/2014  . Postprandial bloating 04/14/2014  . Chronic renal insufficiency, stage II (mild) 01/12/2014  . History of pancreatitis 10/07/2013  . Diabetes mellitus type 2, uncontrolled, with complications 01/07/2012  . Cirrhosis 11/07/2011  . Peripheral neuropathy 09/12/2011  . Thrombocytopenia 07/14/2011  . HTN (hypertension), benign 06/30/2011    Ronney Lion, PTA 06/16/2015, 11:29 AM  [facility] [address]  [address] [place], [place], [address] Phone: [phone]   Fax:  [phone]

## 2015-06-17 ENCOUNTER — Telehealth (HOSPITAL_COMMUNITY): Payer: Self-pay | Admitting: *Deleted

## 2015-06-17 ENCOUNTER — Ambulatory Visit: Payer: 59 | Admitting: Rehabilitation

## 2015-06-17 DIAGNOSIS — R29898 Other symptoms and signs involving the musculoskeletal system: Secondary | ICD-10-CM

## 2015-06-17 DIAGNOSIS — R269 Unspecified abnormalities of gait and mobility: Secondary | ICD-10-CM | POA: Diagnosis not present

## 2015-06-17 NOTE — Telephone Encounter (Signed)
Received mess from [employer], pt's insurance does not require PA for Veltassa pt will be responsible for copay of $90.10 however they have enrolled him in their co-pay program so pt will get medication for $25, it will need to come from [facility] and they have forwarded prescription there

## 2015-06-17 NOTE — Therapy (Signed)
[facility] [address]  [address] [place], [place], [address] Phone: [phone]   Fax:  [phone]  Physical Therapy Treatment  Patient Details  Name: [name] MRN: [id number] Date of Birth: February 05, 1961 Referring Provider:  Laurey Morale, MD  Encounter Date: 06/17/2015      PT End of Session - 06/17/15 1519    Visit Number 6   Number of Visits 12   Date for PT Re-Evaluation 07/13/15   PT Start Time 1517  Pt early   PT Stop Time 1558   PT Time Calculation (min) 41 min   Activity Tolerance Patient tolerated treatment well   Behavior During Therapy [facility] for tasks assessed/performed      Past Medical History  Diagnosis Date  . Heart murmur     ECHO 07/2011 showed mild AS and LVH.  TEE 09/2011 showed small PFO, bicuspid aortic valve but no stenosis or regurg.  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  Gen surg and GI recommended flagyl and cipro x 2 wks..  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  . Cholelithiasis     u/s--no cholecystitis.  06/2014 u/s showed no stones, only GB sludge  . Peripheral neuropathy     Diabetic:  Autonomic (pelvic) and LE polyneuropathy--[facility] testing showed that it is likely from DM  . Diabetic foot ulcers     Poor healing; normal ABIs and TBIs  . Arthritis     back L3 - L5  . Hyperactive gag reflex   . History of pyelonephritis   . Type II or unspecified type diabetes mellitus with neurological manifestations, not stated as uncontrolled     IDDM: neuro, renal, and ophth complications  . Wears dentures     upper  . Osteomyelitis of toe of left foot     left 4th and 5th toes--now s/p amputation of these areas  . Diabetic retinopathy associated with type 2 diabetes mellitus     Laser tx in both eyes (Dr. Allyne Gee)  .  Diabetic nephropathy     Proteinuria and CrCl 55-65  . Chronic renal insufficiency, stage III (moderate)     CrCl 50s-80s 2014 to pre-VFIB arrest 10/02/14, at which time he sustained AKI.  . Lumbar spondylosis     MRI 08/25/11 with multilevel facet dz, small disc protrusion at L5-S1 to the right without impingement  . Ischemic cardiomyopathy 07/22/14    Myoview w/out ischemia but large scar (inferior/apex) w/ global hypokinesis--medical mgmt  . History of pancreatitis 2014 and 2015    Suspected biliary: passing small gallstones: surgery declined to remove GB 07/2014.  . Aspiration pneumonia   . Thrombocytopenia   . Portal hypertension   . Liver cirrhosis secondary to NASH     ?+ alcohol?.  With hx of splenomegaly and ascities (dx approx 2012)  . History of gram negative sepsis 2015    e coli  . Ventricular fibrillation 10/02/14    Resuscitated, hospitalized, 3 V CAD detected, DES stent to mid LAD and to RCA were placed.    Past Surgical History  Procedure Laterality Date  . Knee arthroscopy  2001    Bilateral  . Amputation  10/24/2012    Procedure: AMPUTATION RAY;  Surgeon: Sherri Rad, MD;  Location: [facility];  Service: Orthopedics;  Laterality: Left;  left 4th toe amputation through MTP joint, 5th Ray amputation   . Knee surgery  2001  . Hida scan  06/2014    Normal  . Transthoracic echocardiogram  07/19/14;01/07/15    Mild LV dilation and hypertrophy, EF 35-40%, no wall motion abnormalities, grade II diast dysfxn, bicuspid aortic valve with mild AS and mild AI.  No change on 12/2014 echo.  . Cardiovascular stress test  07/2014    Myoview w/out ischemia but large scar (inferior/apex) w/ global hypokinesis--medical mgmt  . Eye surgery  08/2014    Retinal hemorrhage evacuation  . Coronary angioplasty with stent placement  09/2014    DES to mid LAD and RCA; EF 40% by cardiac MRI  . Cardiac mri  09/2014    Moderately dilated LV.  EF 40%, multiple wall motion abnormalities,  normal RV fxn.  Some areas of scarring.  . Left heart catheterization with coronary angiogram N/A 10/10/2014    Procedure: LEFT HEART CATHETERIZATION WITH CORONARY ANGIOGRAM;  Surgeon: Peter M Swaziland, MD;  Location: [facility] CATH LAB;  Service: Cardiovascular;  Laterality: N/A;  . Left heart catheterization with coronary angiogram N/A 10/15/2014    Procedure: LEFT HEART CATHETERIZATION WITH CORONARY ANGIOGRAM;  Surgeon: Iran Ouch, MD;  Location: [facility] CATH LAB;  Service: Cardiovascular;  Laterality: N/A;  . Carotid dopplers  04/10/15    1-35% bilat; normal subclavians    There were no vitals filed for this visit.  Visit Diagnosis:  Abnormality of gait  Ankle weakness      Subjective Assessment - 06/17/15 1520    Subjective Reports no pain or problems. Felt wobbly after last time but then got better.    Currently in Pain? No/denies         TODAY'S TREATMENT TherEx- Nustep level 5 x6' UE/LE (95 HR) Side-Stepping with cook band 37ftx2 each way 6" Alt Step ups x15  Neuro-Lateral Step ups/over onto Blue Foam x10 Heel/Toe Raises on Blue Foam x15 (2 HHA on poles)  TherEx- Squat lift from plinth Box: box + 0# x10, box + 10# x10 Bridges x10 Hooklying Hip Abd Black TB x10, bilateral Hooklying Hip Add Black TB x10, bilateral  Bridges with Alt Knee Ext x10 each side        PT Short Term Goals - 06/05/15 1010    PT SHORT TERM GOAL #1   Title pt independent with initial HEP by 06/10/15   Status Achieved           PT Long Term Goals - 06/05/15 1010    PT LONG TERM GOAL #1   Title pt scores 50/56 or better with Berg assessment by 07/13/15   Status On-going   PT LONG TERM GOAL #2   Title pt displays B ankle MMT 4/5 or greater all planes by 07/13/15   Status On-going   PT LONG TERM GOAL #3   Title pt denies limiting level of activity due to fear of falling by 07/13/15   Status On-going   PT LONG TERM GOAL #4   Title pt able to ambulate over level and uneven terrain with good  mechanics without need for AD and distances not limited by fear of falling by 07/13/15   Status On-going               Plan - 06/17/15 1557    Clinical Impression Statement Good tolerance to all exercises, no LOB. Focused on TherEx today to help with hip strength. Excellent mechanics with squat  lifts and will progress weight as able.    PT Next Visit Plan ankle and hip strengthening, gait and balance training   Consulted and Agree with Plan of Care Patient        Problem List Patient Active Problem List   Diagnosis Date Noted  . Carotid bruit present 04/30/2015  . Chronic systolic heart failure 11/03/2014  . CAD (coronary artery disease) 10/24/2014  . Hyperkalemia 10/24/2014  . NSTEMI (non-ST elevated myocardial infarction) 10/20/2014  . Cardiomyopathy, ischemic 10/20/2014  . Anemia due to other cause 10/06/2014  . Liver cirrhosis secondary to NASH 09/18/2014  . Diaphoresis 09/18/2014  . Abnormal finding on EKG 07/19/2014  . Ascites 07/19/2014  . Cardiomyopathy 07/19/2014  . Elevated troponin 07/19/2014  . Dehydration 07/18/2014  . Gastroenteritis 04/14/2014  . Postprandial bloating 04/14/2014  . Chronic renal insufficiency, stage II (mild) 01/12/2014  . History of pancreatitis 10/07/2013  . Diabetes mellitus type 2, uncontrolled, with complications 01/07/2012  . Cirrhosis 11/07/2011  . Peripheral neuropathy 09/12/2011  . Thrombocytopenia 07/14/2011  . HTN (hypertension), benign 06/30/2011    Ronney Lion, PTA 06/17/2015, 3:59 PM  [facility] [address]  [address] [place], [place], [address] Phone: [phone]   Fax:  [phone]

## 2015-06-18 ENCOUNTER — Ambulatory Visit: Payer: 59 | Admitting: Physical Therapy

## 2015-06-25 ENCOUNTER — Ambulatory Visit: Payer: 59 | Attending: Cardiology | Admitting: Rehabilitation

## 2015-06-25 DIAGNOSIS — M259 Joint disorder, unspecified: Secondary | ICD-10-CM | POA: Insufficient documentation

## 2015-06-25 DIAGNOSIS — R29898 Other symptoms and signs involving the musculoskeletal system: Secondary | ICD-10-CM

## 2015-06-25 DIAGNOSIS — R269 Unspecified abnormalities of gait and mobility: Secondary | ICD-10-CM | POA: Insufficient documentation

## 2015-06-25 NOTE — Therapy (Signed)
[facility] Outpatient Rehabilitation [facility] [address]  [address] [place], [place], [address] Phone: [phone]   Fax:  [phone]  Physical Therapy Treatment  Patient Details  Name: [name] MRN: [id number] Date of Birth: 1961/02/02 Referring Provider:  Laurey Morale, MD  Encounter Date: 06/25/2015      PT End of Session - 06/25/15 1103    Visit Number 7   Number of Visits 12   Date for PT Re-Evaluation 07/13/15   PT Start Time 1100   PT Stop Time 1141   PT Time Calculation (min) 41 min   Activity Tolerance Patient tolerated treatment well   Behavior During Therapy [facility] for tasks assessed/performed      Past Medical History  Diagnosis Date  . Heart murmur     ECHO 07/2011 showed mild AS and LVH.  TEE 09/2011 showed small PFO, bicuspid aortic valve but no stenosis or regurg.  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  Gen surg and GI recommended flagyl and cipro x 2 wks..  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  . Cholelithiasis     u/s--no cholecystitis.  06/2014 u/s showed no stones, only GB sludge  . Peripheral neuropathy     Diabetic:  Autonomic (pelvic) and LE polyneuropathy--[facility] testing showed that it is likely from DM  . Diabetic foot ulcers     Poor healing; normal ABIs and TBIs  . Arthritis     back L3 - L5  . Hyperactive gag reflex   . History of pyelonephritis   . Type II or unspecified type diabetes mellitus with neurological manifestations, not stated as uncontrolled     IDDM: neuro, renal, and ophth complications  . Wears dentures     upper  . Osteomyelitis of toe of left foot     left 4th and 5th toes--now s/p amputation of these areas  . Diabetic retinopathy associated with type 2 diabetes mellitus     Laser tx in both eyes (Dr. Allyne Gee)  . Diabetic  nephropathy     Proteinuria and CrCl 55-65  . Chronic renal insufficiency, stage III (moderate)     CrCl 50s-80s 2014 to pre-VFIB arrest 10/02/14, at which time he sustained AKI.  . Lumbar spondylosis     MRI 08/25/11 with multilevel facet dz, small disc protrusion at L5-S1 to the right without impingement  . Ischemic cardiomyopathy 07/22/14    Myoview w/out ischemia but large scar (inferior/apex) w/ global hypokinesis--medical mgmt  . History of pancreatitis 2014 and 2015    Suspected biliary: passing small gallstones: surgery declined to remove GB 07/2014.  . Aspiration pneumonia   . Thrombocytopenia   . Portal hypertension   . Liver cirrhosis secondary to NASH     ?+ alcohol?.  With hx of splenomegaly and ascities (dx approx 2012)  . History of gram negative sepsis 2015    e coli  . Ventricular fibrillation 10/02/14    Resuscitated, hospitalized, 3 V CAD detected, DES stent to mid LAD and to RCA were placed.    Past Surgical History  Procedure Laterality Date  . Knee arthroscopy  2001    Bilateral  . Amputation  10/24/2012    Procedure: AMPUTATION RAY;  Surgeon: Sherri Rad, MD;  Location: [facility];  Service: Orthopedics;  Laterality: Left;  left 4th toe amputation through MTP joint, 5th Ray amputation   . Knee surgery  2001  . Hida scan  06/2014    Normal  . Transthoracic echocardiogram  07/19/14;01/07/15    Mild LV dilation and hypertrophy, EF 35-40%, no wall motion abnormalities, grade II diast dysfxn, bicuspid aortic valve with mild AS and mild AI.  No change on 12/2014 echo.  . Cardiovascular stress test  07/2014    Myoview w/out ischemia but large scar (inferior/apex) w/ global hypokinesis--medical mgmt  . Eye surgery  08/2014    Retinal hemorrhage evacuation  . Coronary angioplasty with stent placement  09/2014    DES to mid LAD and RCA; EF 40% by cardiac MRI  . Cardiac mri  09/2014    Moderately dilated LV.  EF 40%, multiple wall motion abnormalities, normal  RV fxn.  Some areas of scarring.  . Left heart catheterization with coronary angiogram N/A 10/10/2014    Procedure: LEFT HEART CATHETERIZATION WITH CORONARY ANGIOGRAM;  Surgeon: Peter M Swaziland, MD;  Location: [facility] CATH LAB;  Service: Cardiovascular;  Laterality: N/A;  . Left heart catheterization with coronary angiogram N/A 10/15/2014    Procedure: LEFT HEART CATHETERIZATION WITH CORONARY ANGIOGRAM;  Surgeon: Iran Ouch, MD;  Location: [facility] CATH LAB;  Service: Cardiovascular;  Laterality: N/A;  . Carotid dopplers  04/10/15    1-35% bilat; normal subclavians    There were no vitals filed for this visit.  Visit Diagnosis:  Abnormality of gait  Ankle weakness      Subjective Assessment - 06/25/15 1102    Subjective Continues to report no pain but has been wobbly recently, especially at night. Also notes more fatigue recently for unknown reason.   Currently in Pain? No/denies      TODAY'S TREATMENT TherEx- Nustep level 6 x6' UE/LE (106 HR) Fwd/Bwd walking with cook band 180ft x1 each way Side-Stepping with cook band 112ftx1 each way Squat lift from plinth with Box: Box + 10# x10, Box +  20# x10 Squat lift from plinth then carry to bench (approx 109ft) and back: Box + 15# x10  Neuro- Heel/Toe Raises on Mini Tramp x20 (2 HHA on mini tramp handle) Slow March on Mini Tramp x15 (1 HHA on mini tramp handle) Lateral Step up/over x10       PT Short Term Goals - 06/05/15 1010    PT SHORT TERM GOAL #1   Title pt independent with initial HEP by 06/10/15   Status Achieved           PT Long Term Goals - 06/05/15 1010    PT LONG TERM GOAL #1   Title pt scores 50/56 or better with Berg assessment by 07/13/15   Status On-going   PT LONG TERM GOAL #2   Title pt displays B ankle MMT 4/5 or greater all planes by 07/13/15   Status On-going   PT LONG TERM GOAL #3   Title pt denies limiting level of activity due to fear of falling by 07/13/15   Status On-going   PT LONG TERM GOAL #4   Title  pt able to ambulate over level and uneven terrain with good mechanics without need for AD and distances not limited by fear of falling by 07/13/15   Status On-going               Plan - 06/25/15 1141    Clinical Impression Statement No LOB with exercises but noted very unsteady when finishing cook band exercises requiring  him to hold the wall for a minute. Good tolerance with box carry exercises noting unsteady when turning to the Lt side. More rest breaks today due to more standing/walking endurance exercises.    PT Next Visit Plan ankle and hip strengthening, gait and balance training   Consulted and Agree with Plan of Care Patient        Problem List Patient Active Problem List   Diagnosis Date Noted  . Carotid bruit present 04/30/2015  . Chronic systolic heart failure 11/03/2014  . CAD (coronary artery disease) 10/24/2014  . Hyperkalemia 10/24/2014  . NSTEMI (non-ST elevated myocardial infarction) 10/20/2014  . Cardiomyopathy, ischemic 10/20/2014  . Anemia due to other cause 10/06/2014  . Liver cirrhosis secondary to NASH 09/18/2014  . Diaphoresis 09/18/2014  . Abnormal finding on EKG 07/19/2014  . Ascites 07/19/2014  . Cardiomyopathy 07/19/2014  . Elevated troponin 07/19/2014  . Dehydration 07/18/2014  . Gastroenteritis 04/14/2014  . Postprandial bloating 04/14/2014  . Chronic renal insufficiency, stage II (mild) 01/12/2014  . History of pancreatitis 10/07/2013  . Diabetes mellitus type 2, uncontrolled, with complications 01/07/2012  . Cirrhosis 11/07/2011  . Peripheral neuropathy 09/12/2011  . Thrombocytopenia 07/14/2011  . HTN (hypertension), benign 06/30/2011    Ronney Lion, PTA 06/25/2015, 11:44 AM  [facility] [address]  [address] [place], [place], [address] Phone: [phone]   Fax:  [phone]

## 2015-06-26 ENCOUNTER — Telehealth (HOSPITAL_COMMUNITY): Payer: Self-pay | Admitting: *Deleted

## 2015-06-26 ENCOUNTER — Ambulatory Visit: Payer: 59 | Admitting: Rehabilitation

## 2015-06-26 ENCOUNTER — Encounter: Payer: Self-pay | Admitting: Rehabilitation

## 2015-06-26 DIAGNOSIS — R269 Unspecified abnormalities of gait and mobility: Secondary | ICD-10-CM | POA: Diagnosis not present

## 2015-06-26 DIAGNOSIS — R29898 Other symptoms and signs involving the musculoskeletal system: Secondary | ICD-10-CM

## 2015-06-26 NOTE — Therapy (Signed)
[facility] Outpatient Rehabilitation [facility] [address]  [address] [place], [place], [address] Phone: [phone]   Fax:  [phone]  Physical Therapy Treatment  Patient Details  Name: [name] MRN: [id number] Date of Birth: [birth year] Referring Provider:  Laurey Morale, MD  Encounter Date: 06/26/2015      PT End of Session - 06/26/15 1022    Visit Number 8   Number of Visits 12   Date for PT Re-Evaluation 07/13/15   PT Start Time 1018      Past Medical History  Diagnosis Date  . Heart murmur     ECHO 07/2011 showed mild AS and LVH.  TEE 09/2011 showed small PFO, bicuspid aortic valve but no stenosis or regurg.  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  Gen surg and GI recommended flagyl and cipro x 2 wks..  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  . Cholelithiasis     u/s--no cholecystitis.  06/2014 u/s showed no stones, only GB sludge  . Peripheral neuropathy     Diabetic:  Autonomic (pelvic) and LE polyneuropathy--[facility] testing showed that it is likely from DM  . Diabetic foot ulcers     Poor healing; normal ABIs and TBIs  . Arthritis     back L3 - L5  . Hyperactive gag reflex   . History of pyelonephritis   . Type II or unspecified type diabetes mellitus with neurological manifestations, not stated as uncontrolled     IDDM: neuro, renal, and ophth complications  . Wears dentures     upper  . Osteomyelitis of toe of left foot     left 4th and 5th toes--now s/p amputation of these areas  . Diabetic retinopathy associated with type 2 diabetes mellitus     Laser tx in both eyes (Dr. Allyne Gee)  . Diabetic nephropathy     Proteinuria and CrCl 55-65  . Chronic renal insufficiency, stage III (moderate)     CrCl 50s-80s 2014 to pre-VFIB arrest 10/02/14, at which time he sustained  AKI.  . Lumbar spondylosis     MRI 08/25/11 with multilevel facet dz, small disc protrusion at L5-S1 to the right without impingement  . Ischemic cardiomyopathy 07/22/14    Myoview w/out ischemia but large scar (inferior/apex) w/ global hypokinesis--medical mgmt  . History of pancreatitis 2014 and 2015    Suspected biliary: passing small gallstones: surgery declined to remove GB 07/2014.  . Aspiration pneumonia   . Thrombocytopenia   . Portal hypertension   . Liver cirrhosis secondary to NASH     ?+ alcohol?.  With hx of splenomegaly and ascities (dx approx 2012)  . History of gram negative sepsis 2015    e coli  . Ventricular fibrillation 10/02/14    Resuscitated, hospitalized, 3 V CAD detected, DES stent to mid LAD and to RCA were placed.    Past Surgical History  Procedure Laterality Date  . Knee arthroscopy  2001    Bilateral  . Amputation  10/24/2012    Procedure: AMPUTATION RAY;  Surgeon: Sherri Rad, MD;  Location: [facility];  Service: Orthopedics;  Laterality: Left;  left 4th toe amputation through MTP joint, 5th Ray amputation   . Knee surgery  2001  . Hida scan  06/2014    Normal  . Transthoracic echocardiogram  07/19/14;01/07/15    Mild LV dilation and hypertrophy, EF 35-40%, no wall motion abnormalities, grade II diast dysfxn, bicuspid aortic valve with mild AS and mild AI.  No change on 12/2014 echo.  . Cardiovascular stress test  07/2014    Myoview w/out ischemia but large scar (inferior/apex) w/ global hypokinesis--medical mgmt  . Eye surgery  08/2014    Retinal hemorrhage evacuation  . Coronary angioplasty with stent placement  09/2014    DES to mid LAD and RCA; EF 40% by cardiac MRI  . Cardiac mri  09/2014    Moderately dilated LV.  EF 40%, multiple wall motion abnormalities, normal RV fxn.  Some areas of scarring.  . Left heart catheterization with coronary angiogram N/A 10/10/2014    Procedure: LEFT HEART CATHETERIZATION WITH CORONARY ANGIOGRAM;   Surgeon: Peter M Swaziland, MD;  Location: [facility] CATH LAB;  Service: Cardiovascular;  Laterality: N/A;  . Left heart catheterization with coronary angiogram N/A 10/15/2014    Procedure: LEFT HEART CATHETERIZATION WITH CORONARY ANGIOGRAM;  Surgeon: Iran Ouch, MD;  Location: [facility] CATH LAB;  Service: Cardiovascular;  Laterality: N/A;  . Carotid dopplers  04/10/15    1-35% bilat; normal subclavians    There were no vitals filed for this visit.  Visit Diagnosis:  Abnormality of gait  Ankle weakness      Subjective Assessment - 06/26/15 1022    Subjective not too sore today. no new complaints   Currently in Pain? No/denies      TODAY'S TREATMENT TherEx- Nustep level 5 x6' UE/LE  Neuro-Lateral Step ups/over onto Blue Foam x10 Heel/Toe Raises on Blue Foam x15 (2 HHA on poles) In corner tandem stance work 3x30" bil on foam  SL stance work R on foam 3xmax and L on [employer] 48ft to cone with R turn and walking again x 95ft with full R turn return; repeated again to the L x 2 with and without looking at the floor  TherEx-  BAPS PF with 7.5# at P x 20,  Inv with 5# at PL and ev with 5# and PM, circles without resistance x 10 each Cook band gait R/L 21ft x 2 each                              PT Short Term Goals - 06/05/15 1010    PT SHORT TERM GOAL #1   Title pt independent with initial HEP by 06/10/15   Status Achieved           PT Long Term Goals - 06/05/15 1010    PT LONG TERM GOAL #1   Title pt scores 50/56 or better with Berg assessment by 07/13/15   Status On-going   PT LONG TERM GOAL #2   Title pt displays B ankle MMT 4/5 or greater all planes by 07/13/15   Status On-going   PT LONG TERM GOAL #3   Title pt denies limiting level of activity due to fear of falling by 07/13/15   Status On-going   PT LONG TERM GOAL #4   Title pt able to ambulate over level and uneven terrain with good mechanics without need for AD and distances not limited  by fear of falling by 07/13/15   Status On-going               Plan - 06/25/15 1141    Clinical Impression Statement No LOB with exercises but noted very unsteady  when finishing cook band exercises requiring him to hold the wall for a minute. Good tolerance with box carry exercises noting unsteady when turning to the Lt side. More rest breaks today due to more standing/walking endurance exercises.    PT Next Visit Plan ankle and hip strengthening, gait and balance training   Consulted and Agree with Plan of Care Patient        Problem List Patient Active Problem List   Diagnosis Date Noted  . Carotid bruit present 04/30/2015  . Chronic systolic heart failure 11/03/2014  . CAD (coronary artery disease) 10/24/2014  . Hyperkalemia 10/24/2014  . NSTEMI (non-ST elevated myocardial infarction) 10/20/2014  . Cardiomyopathy, ischemic 10/20/2014  . Anemia due to other cause 10/06/2014  . Liver cirrhosis secondary to NASH 09/18/2014  . Diaphoresis 09/18/2014  . Abnormal finding on EKG 07/19/2014  . Ascites 07/19/2014  . Cardiomyopathy 07/19/2014  . Elevated troponin 07/19/2014  . Dehydration 07/18/2014  . Gastroenteritis 04/14/2014  . Postprandial bloating 04/14/2014  . Chronic renal insufficiency, stage II (mild) 01/12/2014  . History of pancreatitis 10/07/2013  . Diabetes mellitus type 2, uncontrolled, with complications 01/07/2012  . Cirrhosis 11/07/2011  . Peripheral neuropathy 09/12/2011  . Thrombocytopenia 07/14/2011  . HTN (hypertension), benign 06/30/2011    Idamae Lusher, DPT, CMP 06/26/2015, 10:24 AM  [facility] [address]  [address] [place], [place], [address] Phone: [phone]   Fax:  [phone]

## 2015-06-26 NOTE — Telephone Encounter (Signed)
[employer] form completed with attached requested documentation.  In physician;s folder waiting to be signed.  Ave Filter

## 2015-06-26 NOTE — Telephone Encounter (Signed)
Pt called to let us know he had been on new meds Valtessa and Lisinopril for a week now and needs labs, will come Mon 7/11 at 9 am, pt also ask about STD paperwork, he states the company told him the note we sent didn't say anything about remaining out of work, will send to [facility] to f/u on this

## 2015-06-29 ENCOUNTER — Ambulatory Visit (HOSPITAL_COMMUNITY)
Admission: RE | Admit: 2015-06-29 | Discharge: 2015-06-29 | Disposition: A | Discharge status: Home / Self Care | Payer: 59 | Source: Ambulatory Visit | Attending: Cardiology | Admitting: Cardiology

## 2015-06-29 ENCOUNTER — Ambulatory Visit (INDEPENDENT_AMBULATORY_CARE_PROVIDER_SITE_OTHER): Payer: 59 | Admitting: Physician Assistant

## 2015-06-29 ENCOUNTER — Encounter: Payer: Self-pay | Admitting: Physician Assistant

## 2015-06-29 ENCOUNTER — Ambulatory Visit: Payer: 59 | Admitting: Rehabilitation

## 2015-06-29 VITALS — BP 98/68 | HR 58 | Temp 97.0°F | Resp 20 | Wt 238.0 lb

## 2015-06-29 DIAGNOSIS — I5022 Chronic systolic (congestive) heart failure: Secondary | ICD-10-CM | POA: Diagnosis present

## 2015-06-29 DIAGNOSIS — R55 Syncope and collapse: Secondary | ICD-10-CM | POA: Insufficient documentation

## 2015-06-29 DIAGNOSIS — R5383 Other fatigue: Secondary | ICD-10-CM

## 2015-06-29 LAB — BASIC METABOLIC PANEL
Anion gap: 2 — ABNORMAL LOW (ref 5–15)
BUN: 26 mg/dL — ABNORMAL HIGH (ref 6–20)
CO2: 26 mmol/L (ref 22–32)
Calcium: 8.4 mg/dL — ABNORMAL LOW (ref 8.9–10.3)
Chloride: 109 mmol/L (ref 101–111)
Creatinine, Ser: 1.19 mg/dL (ref 0.61–1.24)
GFR calc non Af Amer: >60 mL/min (ref >60)
Glucose, Bld: 165 mg/dL — ABNORMAL HIGH (ref 65–99)
Potassium: 4.7 mmol/L (ref 3.5–5.1)
SODIUM: 137 mmol/L (ref 135–145)

## 2015-06-29 LAB — GLUCOSE, POCT (MANUAL RESULT ENTRY): POC GLUCOSE: 192 mg/dL — AB (ref 70–99)

## 2015-06-29 LAB — MAGNESIUM: Magnesium: 1.9 mg/dL (ref 1.7–2.4)

## 2015-06-29 NOTE — Assessment & Plan Note (Signed)
EKG without acute abnormality. Glucose at 192. Symptoms and history consistent with vasovagal episode. Symptoms resolved. Recent blood work reviewed and no concerns noted. Encouraged increased hydration and rest for the rest of the day. Continue medications as directed. Handout given so patient can identify triggers for episodes. Follow-up if anything recurs.

## 2015-06-29 NOTE — Patient Instructions (Signed)
Your EKG is consistent with prior cardiac history. No acute findings. Your labs obtained by Cardiology this morning all look good. Renal function is returning to normal.  Your symptoms sound most consistent with a vasovagal episode due to recent blood draw and overexertion in PT.   I want you to go home and rest. Drink plenty of fluids.  Hopefully this is a one-time episode due to blood draw today. Continue medications as directed. If anything recurs please call 911. Follow-up with Cardiologist as scheduled.  Vasovagal Syncope, Adult Syncope, commonly known as fainting, is a temporary loss of consciousness. It occurs when the blood flow to the brain is reduced. Vasovagal syncope (also called neurocardiogenic syncope) is a fainting spell in which the blood flow to the brain is reduced because of a sudden drop in heart rate and blood pressure. Vasovagal syncope occurs when the brain and the cardiovascular system (blood vessels) do not adequately communicate and respond to each other. This is the most common cause of fainting. It often occurs in response to fear or some other type of emotional or physical stress. The body has a reaction in which the heart starts beating too slowly or the blood vessels expand, reducing blood pressure. This type of fainting spell is generally considered harmless. However, injuries can occur if a person takes a sudden fall during a fainting spell.  CAUSES  Vasovagal syncope occurs when a person's blood pressure and heart rate decrease suddenly, usually in response to a trigger. Many things and situations can trigger an episode. Some of these include:   Pain.   Fear.   The sight of blood or medical procedures, such as blood being drawn from a vein.   Common activities, such as coughing, swallowing, stretching, or going to the bathroom.   Emotional stress.   Prolonged standing, especially in a warm environment.   Lack of sleep or rest.   Prolonged lack  of food.   Prolonged lack of fluids.   Recent illness.  The use of certain drugs that affect blood pressure, such as cocaine, alcohol, marijuana, inhalants, and opiates.  SYMPTOMS  Before the fainting episode, you may:   Feel dizzy or light headed.   Become pale.  Sense that you are going to faint.   Feel like the room is spinning.   Have tunnel vision, only seeing directly in front of you.   Feel sick to your stomach (nauseous).   See spots or slowly lose vision.   Hear ringing in your ears.   Have a headache.   Feel warm and sweaty.   Feel a sensation of pins and needles. During the fainting spell, you will generally be unconscious for no longer than a couple minutes before waking up and returning to normal. If you get up too quickly before your body can recover, you may faint again. Some twitching or jerky movements may occur during the fainting spell.  DIAGNOSIS  Your caregiver will ask about your symptoms, take a medical history, and perform a physical exam. Various tests may be done to rule out other causes of fainting. These may include blood tests and tests to check the heart, such as electrocardiography, echocardiography, and possibly an electrophysiology study. When other causes have been ruled out, a test may be done to check the body's response to changes in position (tilt table test). TREATMENT  Most cases of vasovagal syncope do not require treatment. Your caregiver may recommend ways to avoid fainting triggers and may provide home strategies  for preventing fainting. If you must be exposed to a possible trigger, you can drink additional fluids to help reduce your chances of having an episode of vasovagal syncope. If you have warning signs of an oncoming episode, you can respond by positioning yourself favorably (lying down). If your fainting spells continue, you may be given medicines to prevent fainting. Some medicines may help make you more resistant to  repeated episodes of vasovagal syncope. Special exercises or compression stockings may be recommended. In rare cases, the surgical placement of a pacemaker is considered. HOME CARE INSTRUCTIONS   Learn to identify the warning signs of vasovagal syncope.   Sit or lie down at the first warning sign of a fainting spell. If sitting, put your head down between your legs. If you lie down, swing your legs up in the air to increase blood flow to the brain.   Avoid hot tubs and saunas.  Avoid prolonged standing.  Drink enough fluids to keep your urine clear or pale yellow. Avoid caffeine.  Increase salt in your diet as directed by your caregiver.   If you have to stand for a long time, perform movements such as:   Crossing your legs.   Flexing and stretching your leg muscles.   Squatting.   Moving your legs.   Bending over.   Only take over-the-counter or prescription medicines as directed by your caregiver. Do not suddenly stop any medicines without asking your caregiver first. SEEK MEDICAL CARE IF:   Your fainting spells continue or happen more frequently in spite of treatment.   You lose consciousness for more than a couple minutes.  You have fainting spells during or after exercising or after being startled.   You have new symptoms that occur with the fainting spells, such as:   Shortness of breath.  Chest pain.   Irregular heartbeat.   You have episodes of twitching or jerky movements that last longer than a few seconds.  You have episodes of twitching or jerky movements without obvious fainting. SEEK IMMEDIATE MEDICAL CARE IF:   You have injuries or bleeding after a fainting spell.   You have episodes of twitching or jerky movements that last longer than 5 minutes.   You have more than one spell of twitching or jerky movements before returning to consciousness after fainting. MAKE SURE YOU:   Understand these instructions.  Will watch your  condition.  Will get help right away if you are not doing well or get worse. Document Released: 11/21/2012 Document Reviewed: 11/21/2012 [facility] Patient Information 2015 [place], [place]. This information is not intended to replace advice given to you by your health care provider. Make sure you discuss any questions you have with your health care provider.

## 2015-06-29 NOTE — Progress Notes (Signed)
Pre visit review using our clinic review tool, if applicable. No additional management support is needed unless otherwise documented below in the visit note. 

## 2015-06-29 NOTE — Therapy (Signed)
[facility] Outpatient Rehabilitation [facility] [address]  [address] [place], [place], [address] Phone: [phone]   Fax:  [phone]  Patient Details  Name: [name] MRN: [id number] Date of Birth: May 04, 1961 Referring Provider:  Laurey Morale, MD  Encounter Date: 06/29/2015  Pt came in to therapy with complaint of high HR, light headed feeling, and blurry vision. Pt stated this improved with rest but then occurred again when attempting Nustep machine. Stopped therapy due this and referred him to his MD. Will cancel today's treatment and resume as pt is able at next treatment.    Nestor Ramp [place], [place] 06/29/2015, 11:52 AM  [facility] [address]  [address] [place], [place], [address] Phone: [phone]   Fax:  [phone]

## 2015-06-29 NOTE — Progress Notes (Signed)
Patient presents to clinic today c/o episode of racing heart this morning while climbing stairs to PT appointment. Rested once he got to office. During PT while on treadmill, endorses becoming nauseated, hot and lightheaded. Thought might be his sugar so took a piece of candy. Blood sugar normal. Has one episode of non-bilious, non-bloody emesis due to nausea.Denies chest pain or SOB. Was sent to clinic from PT for assessment. Endorses symptoms have resolved. Has been drinking water and feeling better. States before PT appointment he was at his Cardiologist and gave blood for labs.  Past Medical History  Diagnosis Date  . Heart murmur     ECHO 07/2011 showed mild AS and LVH.  TEE 09/2011 showed small PFO, bicuspid aortic valve but no stenosis or regurg.  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  Gen surg and GI recommended flagyl and cipro x 2 wks..  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  . Cholelithiasis     u/s--no cholecystitis.  06/2014 u/s showed no stones, only GB sludge  . Peripheral neuropathy     Diabetic:  Autonomic (pelvic) and LE polyneuropathy--[facility] testing showed that it is likely from DM  . Diabetic foot ulcers     Poor healing; normal ABIs and TBIs  . Arthritis     back L3 - L5  . Hyperactive gag reflex   . History of pyelonephritis   . Type II or unspecified type diabetes mellitus with neurological manifestations, not stated as uncontrolled     IDDM: neuro, renal, and ophth complications  . Wears dentures     upper  . Osteomyelitis of toe of left foot     left 4th and 5th toes--now s/p amputation of these areas  . Diabetic retinopathy associated with type 2 diabetes mellitus     Laser tx in both eyes (Dr. Baird Cancer)  . Diabetic nephropathy     Proteinuria and CrCl 55-65  . Chronic renal insufficiency, stage  III (moderate)     CrCl 50s-80s 2014 to pre-VFIB arrest 10/02/14, at which time he sustained AKI.  . Lumbar spondylosis     MRI 08/25/11 with multilevel facet dz, small disc protrusion at L5-S1 to the right without impingement  . Ischemic cardiomyopathy 07/22/14    Myoview w/out ischemia but large scar (inferior/apex) w/ global hypokinesis--medical mgmt  . History of pancreatitis 2014 and 2015    Suspected biliary: passing small gallstones: surgery declined to remove GB 07/2014.  . Aspiration pneumonia   . Thrombocytopenia   . Portal hypertension   . Liver cirrhosis secondary to NASH     ?+ alcohol?.  With hx of splenomegaly and ascities (dx approx 2012)  . History of gram negative sepsis 2015    e coli  . Ventricular fibrillation 10/02/14    Resuscitated, hospitalized, 3 V CAD detected, DES stent to mid LAD and to RCA were placed.    Current Outpatient Prescriptions on File Prior to Visit  Medication Sig Dispense Refill  . aspirin 81 MG chewable tablet Chew 1 tablet (81 mg total) by mouth daily.    [name] atorvastatin (LIPITOR) 20 MG tablet Take 1 tablet (20 mg total) by mouth daily at 6 PM. 30 tablet 3  . carvedilol (COREG) 25 MG tablet Take 1 tablet (25 mg total) by mouth 2 (two) times daily with a meal. 60  tablet 6  . clopidogrel (PLAVIX) 75 MG tablet Take 1 tablet (75 mg total) by mouth daily. 30 tablet 3  . furosemide (LASIX) 40 MG tablet Take 1 tablet (40 mg total) by mouth daily. Hold if weight is 225 lb or less 60 tablet 6  . glucose blood (FREESTYLE TEST STRIPS) test strip Use as instructed 100 each 6  . hydrALAZINE (APRESOLINE) 25 MG tablet Take 1 tablet (25 mg total) by mouth 3 (three) times daily. 90 tablet 3  . insulin aspart (NOVOLOG) 100 UNIT/ML injection Inject 0-10 Units into the skin 3 (three) times daily before meals. Sliding scale    . Insulin Degludec (TRESIBA FLEXTOUCH) 100 UNIT/ML SOPN Inject 18 Units into the skin at bedtime. 5 pen 2  . Insulin Pen Needle 32G X 6 MM MISC  Use for insulin injections 4 times per day 150 each 12  . isosorbide mononitrate (IMDUR) 30 MG 24 hr tablet TAKE 1 TABLET (30 MG TOTAL) BY MOUTH DAILY. 30 tablet 3  . lidocaine (LIDODERM) 5 % Place 1 patch onto the skin daily. Remove & Discard patch within 12 hours or as directed by MD (Patient taking differently: Place 1 patch onto the skin as needed. Remove & Discard patch within 12 hours or as directed by MD) 30 patch 0  . lisinopril (PRINIVIL,ZESTRIL) 5 MG tablet Take 1 tablet (5 mg total) by mouth daily. 30 tablet 3  . nitroGLYCERIN (NITROSTAT) 0.4 MG SL tablet Place 1 tablet (0.4 mg total) under the tongue every 5 (five) minutes as needed for chest pain. 25 tablet 0  . patiromer (VELTASSA) 8.4 G packet Take 1 packet (8.4 g total) by mouth daily. 30 packet    No current facility-administered medications on file prior to visit.    Allergies  Allergen Reactions  . Iodine Other (See Comments)    Family history of anaphylaxis, unsure if patient has reaction  . Other Shortness Of Breath, Itching and Other (See Comments)    PERFUMES - SNEEZING  . Hydralazine Hcl Hives  . Naproxen Nausea And Vomiting    Family History  Problem Relation Age of Onset  . Heart disease Father   . Diabetes type II Sister     History   Social History  . Marital Status: Single    Spouse Name: N/A  . Number of Children: N/A  . Years of Education: N/A   Social History Main Topics  . Smoking status: Never Smoker   . Smokeless tobacco: Current User    Types: Chew     Comment: occasional 1 can per 2 weeks  . Alcohol Use: No     Comment: Quit drinking alcohol 08/20/2011  . Drug Use: No  . Sexual Activity: Not on file   Other Topics Concern  . Not on file   Social History Narrative   Single, no children.  Lives in [place], relocated from [place], [place] in 2008.   Chews tobacco but does not smoke.   Drinks 12 Beers daily---patient did not initially report/admit this--as of 2013 he has quit this.   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   Review of Systems - See HPI.  All other ROS are negative.  BP 98/68 mmHg  Pulse 58  Temp(Src) 97 F (36.1 C) (Oral)  Resp 20  Wt 238 lb (107.956 kg)  SpO2 98%  Physical Exam  Constitutional: He is oriented to person, place, and time and well-developed, well-nourished, and in  no distress.  HENT:  Head: Normocephalic and atraumatic.  Eyes: Conjunctivae are normal.  Neck: Neck supple.  Cardiovascular: Normal rate, regular rhythm, normal heart sounds and intact distal pulses.   Pulmonary/Chest: Effort normal and breath sounds normal. No respiratory distress. He has no wheezes. He has no rales. He exhibits no tenderness.  Neurological: He is alert and oriented to person, place, and time.  Skin: Skin is warm and dry.  Psychiatric: Affect normal.  Vitals reviewed.   Recent Results (from the past 2160 hour(s))  Glucose, capillary     Status: None   Collection Time: 04/01/15  8:48 AM  Result Value Ref Range   Glucose-Capillary 81 70 - 99 mg/dL  Glucose, capillary     Status: Abnormal   Collection Time: 04/01/15  9:07 AM  Result Value Ref Range   Glucose-Capillary 110 (H) 70 - 99 mg/dL  Glucose, capillary     Status: Abnormal   Collection Time: 04/01/15  9:35 AM  Result Value Ref Range   Glucose-Capillary 121 (H) 70 - 99 mg/dL  Glucose, capillary     Status: None   Collection Time: 04/03/15  8:18 AM  Result Value Ref Range   Glucose-Capillary 76 70 - 99 mg/dL  Glucose, capillary     Status: None   Collection Time: 04/03/15  9:05 AM  Result Value Ref Range   Glucose-Capillary 98 70 - 99 mg/dL  Glucose, capillary     Status: Abnormal   Collection Time: 04/06/15  8:18 AM  Result Value Ref Range   Glucose-Capillary 135 (H) 70 - 99 mg/dL  Glucose, capillary     Status: None   Collection Time: 04/06/15  9:11 AM  Result Value Ref Range   Glucose-Capillary 72 70 - 99 mg/dL  Basic metabolic panel     Status:  Abnormal   Collection Time: 04/06/15 10:15 AM  Result Value Ref Range   Sodium 137 135 - 145 mmol/L   Potassium 5.2 (H) 3.5 - 5.1 mmol/L   Chloride 106 96 - 112 mmol/L   CO2 23 19 - 32 mmol/L   Glucose, Bld 110 (H) 70 - 99 mg/dL   BUN 51 (H) 6 - 23 mg/dL   Creatinine, Ser 1.29 0.50 - 1.35 mg/dL   Calcium 8.8 8.4 - 10.5 mg/dL   GFR calc non Af Amer 62 (L) >90 mL/min   GFR calc Af Amer 72 (L) >90 mL/min    Comment: (NOTE) The eGFR has been calculated using the CKD EPI equation. This calculation has not been validated in all clinical situations. eGFR's persistently <90 mL/min signify possible Chronic Kidney Disease.    Anion gap 8 5 - 15  CBC     Status: Abnormal   Collection Time: 04/06/15 10:15 AM  Result Value Ref Range   WBC 5.0 4.0 - 10.5 K/uL   RBC 3.70 (L) 4.22 - 5.81 MIL/uL   Hemoglobin 11.2 (L) 13.0 - 17.0 g/dL   HCT 32.9 (L) 39.0 - 52.0 %   MCV 88.9 78.0 - 100.0 fL   MCH 30.3 26.0 - 34.0 pg   MCHC 34.0 30.0 - 36.0 g/dL   RDW 13.0 11.5 - 15.5 %   Platelets 47 (L) 150 - 400 K/uL    Comment: SPECIMEN CHECKED FOR CLOTS REPEATED TO VERIFY PLATELET COUNT CONFIRMED BY SMEAR   Glucose, capillary     Status: Abnormal   Collection Time: 04/08/15  8:33 AM  Result Value Ref Range   Glucose-Capillary 208 (H) 70 -  99 mg/dL  Glucose, capillary     Status: Abnormal   Collection Time: 04/10/15  6:55 AM  Result Value Ref Range   Glucose-Capillary 151 (H) 70 - 99 mg/dL  Glucose, capillary     Status: Abnormal   Collection Time: 04/10/15  7:58 AM  Result Value Ref Range   Glucose-Capillary 111 (H) 70 - 99 mg/dL  Glucose, capillary     Status: Abnormal   Collection Time: 04/13/15  7:57 AM  Result Value Ref Range   Glucose-Capillary 141 (H) 70 - 99 mg/dL  Glucose, capillary     Status: Abnormal   Collection Time: 04/13/15  9:02 AM  Result Value Ref Range   Glucose-Capillary 63 (L) 70 - 99 mg/dL  Glucose, capillary     Status: Abnormal   Collection Time: 04/13/15  9:25 AM    Result Value Ref Range   Glucose-Capillary 142 (H) 70 - 99 mg/dL  Glucose, capillary     Status: Abnormal   Collection Time: 04/15/15  8:39 AM  Result Value Ref Range   Glucose-Capillary 176 (H) 70 - 99 mg/dL  Glucose, capillary     Status: Abnormal   Collection Time: 04/24/15  8:27 AM  Result Value Ref Range   Glucose-Capillary 142 (H) 70 - 99 mg/dL  Glucose, capillary     Status: Abnormal   Collection Time: 04/24/15  9:26 AM  Result Value Ref Range   Glucose-Capillary 101 (H) 70 - 99 mg/dL  Glucose, capillary     Status: Abnormal   Collection Time: 04/27/15  8:19 AM  Result Value Ref Range   Glucose-Capillary 145 (H) 70 - 99 mg/dL  Glucose, capillary     Status: Abnormal   Collection Time: 04/27/15  9:30 AM  Result Value Ref Range   Glucose-Capillary 135 (H) 70 - 99 mg/dL  Glucose, capillary     Status: Abnormal   Collection Time: 04/29/15  8:34 AM  Result Value Ref Range   Glucose-Capillary 167 (H) 70 - 99 mg/dL  Glucose, capillary     Status: Abnormal   Collection Time: 04/29/15  9:34 AM  Result Value Ref Range   Glucose-Capillary 132 (H) 70 - 99 mg/dL  Basic metabolic panel     Status: Abnormal   Collection Time: 04/30/15  9:50 AM  Result Value Ref Range   Sodium 139 135 - 145 mmol/L   Potassium 5.2 (H) 3.5 - 5.1 mmol/L   Chloride 105 101 - 111 mmol/L   CO2 28 22 - 32 mmol/L   Glucose, Bld 240 (H) 65 - 99 mg/dL   BUN 38 (H) 6 - 20 mg/dL   Creatinine, Ser 1.23 0.61 - 1.24 mg/dL   Calcium 8.6 (L) 8.9 - 10.3 mg/dL   GFR calc non Af Amer >60 >60 mL/min   GFR calc Af Amer >60 >60 mL/min    Comment: (NOTE) The eGFR has been calculated using the CKD EPI equation. This calculation has not been validated in all clinical situations. eGFR's persistently <60 mL/min signify possible Chronic Kidney Disease.    Anion gap 6 5 - 15  B Nat Peptide     Status: Abnormal   Collection Time: 04/30/15  9:50 AM  Result Value Ref Range   B Natriuretic Peptide 351.9 (H) 0.0 - 100.0  pg/mL  Glucose, capillary     Status: Abnormal   Collection Time: 05/01/15  8:34 AM  Result Value Ref Range   Glucose-Capillary 110 (H) 65 - 99 mg/dL  Glucose, capillary  Status: None   Collection Time: 05/01/15  9:24 AM  Result Value Ref Range   Glucose-Capillary 79 65 - 99 mg/dL  Glucose, capillary     Status: Abnormal   Collection Time: 05/01/15  9:48 AM  Result Value Ref Range   Glucose-Capillary 136 (H) 65 - 99 mg/dL  Glucose, capillary     Status: None   Collection Time: 05/04/15  8:21 AM  Result Value Ref Range   Glucose-Capillary 77 65 - 99 mg/dL  Glucose, capillary     Status: None   Collection Time: 05/04/15  8:44 AM  Result Value Ref Range   Glucose-Capillary 81 65 - 99 mg/dL  Glucose, capillary     Status: Abnormal   Collection Time: 05/04/15  9:32 AM  Result Value Ref Range   Glucose-Capillary 109 (H) 65 - 99 mg/dL  Glucose, capillary     Status: None   Collection Time: 05/04/15 10:38 AM  Result Value Ref Range   Glucose-Capillary 68 65 - 99 mg/dL  Glucose, capillary     Status: Abnormal   Collection Time: 05/04/15 11:05 AM  Result Value Ref Range   Glucose-Capillary 108 (H) 65 - 99 mg/dL  Glucose, capillary     Status: Abnormal   Collection Time: 05/06/15  8:42 AM  Result Value Ref Range   Glucose-Capillary 188 (H) 65 - 99 mg/dL  Glucose, capillary     Status: None   Collection Time: 05/06/15  9:27 AM  Result Value Ref Range   Glucose-Capillary 89 65 - 99 mg/dL  Glucose, capillary     Status: Abnormal   Collection Time: 05/06/15 10:05 AM  Result Value Ref Range   Glucose-Capillary 154 (H) 65 - 99 mg/dL  Glucose, capillary     Status: Abnormal   Collection Time: 05/08/15  8:07 AM  Result Value Ref Range   Glucose-Capillary 208 (H) 65 - 99 mg/dL  Glucose, capillary     Status: Abnormal   Collection Time: 05/08/15  9:31 AM  Result Value Ref Range   Glucose-Capillary 131 (H) 65 - 99 mg/dL  Glucose, capillary     Status: Abnormal   Collection Time:  05/11/15  8:18 AM  Result Value Ref Range   Glucose-Capillary 112 (H) 65 - 99 mg/dL  Glucose, capillary     Status: None   Collection Time: 05/11/15  9:10 AM  Result Value Ref Range   Glucose-Capillary 89 65 - 99 mg/dL  Glucose, capillary     Status: Abnormal   Collection Time: 05/11/15  9:47 AM  Result Value Ref Range   Glucose-Capillary 169 (H) 65 - 99 mg/dL  Glucose, capillary     Status: Abnormal   Collection Time: 05/13/15  8:19 AM  Result Value Ref Range   Glucose-Capillary 157 (H) 65 - 99 mg/dL  Glucose, capillary     Status: None   Collection Time: 05/13/15  9:07 AM  Result Value Ref Range   Glucose-Capillary 90 65 - 99 mg/dL  HgB A1c     Status: None   Collection Time: 05/22/15  9:25 AM  Result Value Ref Range   Hgb A1c MFr Bld 6.4 4.6 - 6.5 %    Comment: Glycemic Control Guidelines for People with Diabetes:Non Diabetic:  <6%Goal of Therapy: <7%Additional Action Suggested:  >8%   Basic metabolic panel     Status: Abnormal   Collection Time: 06/12/15  9:36 AM  Result Value Ref Range   Sodium 140 135 - 145 mmol/L   Potassium  5.0 3.5 - 5.1 mmol/L   Chloride 108 101 - 111 mmol/L   CO2 26 22 - 32 mmol/L   Glucose, Bld 150 (H) 65 - 99 mg/dL   BUN 41 (H) 6 - 20 mg/dL   Creatinine, Ser 1.39 (H) 0.61 - 1.24 mg/dL   Calcium 8.9 8.9 - 10.3 mg/dL   GFR calc non Af Amer 57 (L) >60 mL/min   GFR calc Af Amer >60 >60 mL/min    Comment: (NOTE) The eGFR has been calculated using the CKD EPI equation. This calculation has not been validated in all clinical situations. eGFR's persistently <60 mL/min signify possible Chronic Kidney Disease.    Anion gap 6 5 - 15  CBC     Status: Abnormal   Collection Time: 06/12/15  9:36 AM  Result Value Ref Range   WBC 5.4 4.0 - 10.5 K/uL   RBC 3.96 (L) 4.22 - 5.81 MIL/uL   Hemoglobin 11.8 (L) 13.0 - 17.0 g/dL   HCT 34.9 (L) 39.0 - 52.0 %   MCV 88.1 78.0 - 100.0 fL   MCH 29.8 26.0 - 34.0 pg   MCHC 33.8 30.0 - 36.0 g/dL   RDW 12.5 11.5 -  15.5 %   Platelets 41 (L) 150 - 400 K/uL    Comment: PLATELET COUNT CONFIRMED BY SMEAR  Basic metabolic panel     Status: Abnormal   Collection Time: 06/29/15  9:18 AM  Result Value Ref Range   Sodium 137 135 - 145 mmol/L   Potassium 4.7 3.5 - 5.1 mmol/L   Chloride 109 101 - 111 mmol/L   CO2 26 22 - 32 mmol/L   Glucose, Bld 165 (H) 65 - 99 mg/dL   BUN 26 (H) 6 - 20 mg/dL   Creatinine, Ser 1.19 0.61 - 1.24 mg/dL   Calcium 8.4 (L) 8.9 - 10.3 mg/dL   GFR calc non Af Amer >60 >60 mL/min   GFR calc Af Amer >60 >60 mL/min    Comment: (NOTE) The eGFR has been calculated using the CKD EPI equation. This calculation has not been validated in all clinical situations. eGFR's persistently <60 mL/min signify possible Chronic Kidney Disease.    Anion gap 2 (L) 5 - 15    Comment: REPEATED TO VERIFY  Magnesium     Status: None   Collection Time: 06/29/15  9:18 AM  Result Value Ref Range   Magnesium 1.9 1.7 - 2.4 mg/dL  POCT Glucose (CBG)     Status: Abnormal   Collection Time: 06/29/15 10:26 AM  Result Value Ref Range   POC Glucose 192 (A) 70 - 99 mg/dl    Comment: pt last ate breakfast (oatmeal)    Assessment/Plan: Vasovagal near syncope EKG without acute abnormality. Glucose at 192. Symptoms and history consistent with vasovagal episode. Symptoms resolved. Recent blood work reviewed and no concerns noted. Encouraged increased hydration and rest for the rest of the day. Continue medications as directed. Handout given so patient can identify triggers for episodes. Follow-up if anything recurs.

## 2015-07-02 ENCOUNTER — Ambulatory Visit: Payer: 59 | Admitting: Physical Therapy

## 2015-07-02 DIAGNOSIS — R269 Unspecified abnormalities of gait and mobility: Secondary | ICD-10-CM | POA: Diagnosis not present

## 2015-07-02 DIAGNOSIS — R29898 Other symptoms and signs involving the musculoskeletal system: Secondary | ICD-10-CM

## 2015-07-02 NOTE — Therapy (Addendum)
[facility] [address]  [address] [place], [place], [address] Phone: [phone]   Fax:  [phone]  Physical Therapy Treatment  Patient Details  Name: [name] MRN: [id number] Date of Birth: 07-Oct-1961 Referring Provider:  Laurey Morale, MD  Encounter Date: 07/02/2015      PT End of Session - 07/02/15 1023    Visit Number 9   Number of Visits 12   Date for PT Re-Evaluation 07/13/15   PT Start Time 1021   PT Stop Time 1105   PT Time Calculation (min) 44 min      Past Medical History  Diagnosis Date  . Heart murmur     ECHO 07/2011 showed mild AS and LVH.  TEE 09/2011 showed small PFO, bicuspid aortic valve but no stenosis or regurg.  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  Gen surg and GI recommended flagyl and cipro x 2 wks..  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  . Cholelithiasis     u/s--no cholecystitis.  06/2014 u/s showed no stones, only GB sludge  . Peripheral neuropathy     Diabetic:  Autonomic (pelvic) and LE polyneuropathy--[facility] testing showed that it is likely from DM  . Diabetic foot ulcers     Poor healing; normal ABIs and TBIs  . Arthritis     back L3 - L5  . Hyperactive gag reflex   . History of pyelonephritis   . Type II or unspecified type diabetes mellitus with neurological manifestations, not stated as uncontrolled     IDDM: neuro, renal, and ophth complications  . Wears dentures     upper  . Osteomyelitis of toe of left foot     left 4th and 5th toes--now s/p amputation of these areas  . Diabetic retinopathy associated with type 2 diabetes mellitus     Laser tx in both eyes (Dr. Allyne Gee)  . Diabetic nephropathy     Proteinuria and CrCl 55-65  . Chronic renal insufficiency, stage III (moderate)     CrCl 50s-80s 2014  to pre-VFIB arrest 10/02/14, at which time he sustained AKI.  . Lumbar spondylosis     MRI 08/25/11 with multilevel facet dz, small disc protrusion at L5-S1 to the right without impingement  . Ischemic cardiomyopathy 07/22/14    Myoview w/out ischemia but large scar (inferior/apex) w/ global hypokinesis--medical mgmt  . History of pancreatitis 2014 and 2015    Suspected biliary: passing small gallstones: surgery declined to remove GB 07/2014.  . Aspiration pneumonia   . Thrombocytopenia   . Portal hypertension   . Liver cirrhosis secondary to NASH     ?+ alcohol?.  With hx of splenomegaly and ascities (dx approx 2012)  . History of gram negative sepsis 2015    e coli  . Ventricular fibrillation 10/02/14    Resuscitated, hospitalized, 3 V CAD detected, DES stent to mid LAD and to RCA were placed.    Past Surgical History  Procedure Laterality Date  . Knee arthroscopy  2001    Bilateral  . Amputation  10/24/2012    Procedure: AMPUTATION RAY;  Surgeon: Sherri Rad, MD;  Location: [facility];  Service: Orthopedics;  Laterality: Left;  left 4th toe amputation through MTP joint, 5th Ray amputation   . Knee surgery  2001  .  Hida scan  06/2014    Normal  . Transthoracic echocardiogram  07/19/14;01/07/15    Mild LV dilation and hypertrophy, EF 35-40%, no wall motion abnormalities, grade II diast dysfxn, bicuspid aortic valve with mild AS and mild AI.  No change on 12/2014 echo.  . Cardiovascular stress test  07/2014    Myoview w/out ischemia but large scar (inferior/apex) w/ global hypokinesis--medical mgmt  . Eye surgery  08/2014    Retinal hemorrhage evacuation  . Coronary angioplasty with stent placement  09/2014    DES to mid LAD and RCA; EF 40% by cardiac MRI  . Cardiac mri  09/2014    Moderately dilated LV.  EF 40%, multiple wall motion abnormalities, normal RV fxn.  Some areas of scarring.  . Left heart catheterization with coronary angiogram N/A 10/10/2014    Procedure: LEFT  HEART CATHETERIZATION WITH CORONARY ANGIOGRAM;  Surgeon: Peter M Swaziland, MD;  Location: [facility] CATH LAB;  Service: Cardiovascular;  Laterality: N/A;  . Left heart catheterization with coronary angiogram N/A 10/15/2014    Procedure: LEFT HEART CATHETERIZATION WITH CORONARY ANGIOGRAM;  Surgeon: Iran Ouch, MD;  Location: [facility] CATH LAB;  Service: Cardiovascular;  Laterality: N/A;  . Carotid dopplers  04/10/15    1-35% bilat; normal subclavians    There were no vitals filed for this visit.  Visit Diagnosis:  Abnormality of gait  Ankle weakness      Subjective Assessment - 07/02/15 1026    Subjective States balance is improving, no stumbling lately but states L ankle still feels unstable.  Went to MD Monday due to nausea and was told was dehydrated.   Currently in Pain? No/denies          TODAY'S TREATMENT TherEx - NuStep lvl 7, 3' MiniTramp Heel/Toe 20x with B fingertip A MiniTramp March in place 48" with intermittent UE A Foam Beam side stepping 3 laps, note large L ankle IV with each step  Due to poor ankle stability, advised pt he needs ankle brace.  We searched [employer] and found good ASO brace.  Info give to pt to purchase.  TherEx - Seated BAPS lvl 4, PF/DF w/ 5# P, PL, PM, AL, and AM 20x each Tandem Standing Rotation stability with red TB  5x5" each                    PT Education - 07/02/15 1331    Education provided Yes   Education Details instructed in need for ankle brace to L ankle and looked on [email or web address] and found one, pt to order; added tandem standing rotation stability training to HEP with Red TB   Person(s) Educated Patient   Methods Explanation;Demonstration   Comprehension Verbalized understanding;Returned demonstration          PT Short Term Goals - 06/05/15 1010    PT SHORT TERM GOAL #1   Title pt independent with initial HEP by 06/10/15   Status Achieved           PT Long Term Goals - 07/02/15 1339    PT LONG TERM GOAL #1    Title pt scores 50/56 or better with Berg assessment by 07/13/15   Status On-going   PT LONG TERM GOAL #2   Title pt displays B ankle MMT 4/5 or greater all planes by 07/13/15   Status On-going   PT LONG TERM GOAL #3   Title pt denies limiting level of activity due to fear of falling by 07/13/15  Status On-going   PT LONG TERM GOAL #4   Title pt able to ambulate over level and uneven terrain with good mechanics without need for AD and distances not limited by fear of falling by 07/13/15   Status On-going               Plan - 07/02/15 1332    Clinical Impression Statement pt continued very weak L ankle and note ankle instability with balance training on noncompliant surface today so advised pt to purchase ankle brace.  we looked up appropriate brace on [employer].com and pt is to order.  updated HEP since has not been changed since initial eval.   PT Next Visit Plan ankle and hip strengthening, gait and balance training; make certain pt purchases ankle brace and is using appropriately   Consulted and Agree with Plan of Care Patient        Problem List Patient Active Problem List   Diagnosis Date Noted  . Vasovagal near syncope 06/29/2015  . Carotid bruit present 04/30/2015  . Chronic systolic heart failure 11/03/2014  . CAD (coronary artery disease) 10/24/2014  . Hyperkalemia 10/24/2014  . NSTEMI (non-ST elevated myocardial infarction) 10/20/2014  . Cardiomyopathy, ischemic 10/20/2014  . Anemia due to other cause 10/06/2014  . Liver cirrhosis secondary to NASH 09/18/2014  . Diaphoresis 09/18/2014  . Abnormal finding on EKG 07/19/2014  . Ascites 07/19/2014  . Cardiomyopathy 07/19/2014  . Elevated troponin 07/19/2014  . Dehydration 07/18/2014  . Gastroenteritis 04/14/2014  . Postprandial bloating 04/14/2014  . Chronic renal insufficiency, stage II (mild) 01/12/2014  . History of pancreatitis 10/07/2013  . Diabetes mellitus type 2, uncontrolled, with complications 01/07/2012   . Cirrhosis 11/07/2011  . Peripheral neuropathy 09/12/2011  . Thrombocytopenia 07/14/2011  . HTN (hypertension), benign 06/30/2011    Lydon Vansickle PT, OCS 07/02/2015, 1:43 PM  [facility] [address]  [address] [place], [place], [address] Phone: [phone]   Fax:  [phone]

## 2015-07-03 ENCOUNTER — Encounter (HOSPITAL_COMMUNITY): Payer: Self-pay | Admitting: Cardiology

## 2015-07-03 NOTE — Progress Notes (Signed)
Pt's form to remain out of work along with 6/24 OV note faxed to [employer] at [phone]

## 2015-07-06 ENCOUNTER — Ambulatory Visit: Payer: 59 | Admitting: Rehabilitation

## 2015-07-06 DIAGNOSIS — R269 Unspecified abnormalities of gait and mobility: Secondary | ICD-10-CM

## 2015-07-06 DIAGNOSIS — R29898 Other symptoms and signs involving the musculoskeletal system: Secondary | ICD-10-CM

## 2015-07-06 NOTE — Therapy (Signed)
[facility] Outpatient Rehabilitation [facility] [address]  [address] [place], [place], [address] Phone: [phone]   Fax:  [phone]  Physical Therapy Treatment  Patient Details  Name: [name] MRN: [id number] Date of Birth: [birth year] Referring Provider:  Laurey Morale, MD  Encounter Date: 07/06/2015      PT End of Session - 07/06/15 1012    Visit Number 10   Number of Visits 12   Date for PT Re-Evaluation 07/13/15   PT Start Time 1011   PT Stop Time 1052   PT Time Calculation (min) 41 min      Past Medical History  Diagnosis Date  . Heart murmur     ECHO 07/2011 showed mild AS and LVH.  TEE 09/2011 showed small PFO, bicuspid aortic valve but no stenosis or regurg.  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  Gen surg and GI recommended flagyl and cipro x 2 wks..  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  . Cholelithiasis     u/s--no cholecystitis.  06/2014 u/s showed no stones, only GB sludge  . Peripheral neuropathy     Diabetic:  Autonomic (pelvic) and LE polyneuropathy--[facility] testing showed that it is likely from DM  . Diabetic foot ulcers     Poor healing; normal ABIs and TBIs  . Arthritis     back L3 - L5  . Hyperactive gag reflex   . History of pyelonephritis   . Type II or unspecified type diabetes mellitus with neurological manifestations, not stated as uncontrolled     IDDM: neuro, renal, and ophth complications  . Wears dentures     upper  . Osteomyelitis of toe of left foot     left 4th and 5th toes--now s/p amputation of these areas  . Diabetic retinopathy associated with type 2 diabetes mellitus     Laser tx in both eyes (Dr. Allyne Gee)  . Diabetic nephropathy     Proteinuria and CrCl 55-65  . Chronic renal insufficiency, stage III (moderate)     CrCl 50s-80s  2014 to pre-VFIB arrest 10/02/14, at which time he sustained AKI.  . Lumbar spondylosis     MRI 08/25/11 with multilevel facet dz, small disc protrusion at L5-S1 to the right without impingement  . Ischemic cardiomyopathy 07/22/14    Myoview w/out ischemia but large scar (inferior/apex) w/ global hypokinesis--medical mgmt  . History of pancreatitis 2014 and 2015    Suspected biliary: passing small gallstones: surgery declined to remove GB 07/2014.  . Aspiration pneumonia   . Thrombocytopenia   . Portal hypertension   . Liver cirrhosis secondary to NASH     ?+ alcohol?.  With hx of splenomegaly and ascities (dx approx 2012)  . History of gram negative sepsis 2015    e coli  . Ventricular fibrillation 10/02/14    Resuscitated, hospitalized, 3 V CAD detected, DES stent to mid LAD and to RCA were placed.    Past Surgical History  Procedure Laterality Date  . Knee arthroscopy  2001    Bilateral  . Amputation  10/24/2012    Procedure: AMPUTATION RAY;  Surgeon: Sherri Rad, MD;  Location: [facility];  Service: Orthopedics;  Laterality: Left;  left 4th toe amputation through MTP joint, 5th Ray amputation   . Knee surgery  2001  .  Hida scan  06/2014    Normal  . Transthoracic echocardiogram  07/19/14;01/07/15    Mild LV dilation and hypertrophy, EF 35-40%, no wall motion abnormalities, grade II diast dysfxn, bicuspid aortic valve with mild AS and mild AI.  No change on 12/2014 echo.  . Cardiovascular stress test  07/2014    Myoview w/out ischemia but large scar (inferior/apex) w/ global hypokinesis--medical mgmt  . Eye surgery  08/2014    Retinal hemorrhage evacuation  . Coronary angioplasty with stent placement  09/2014    DES to mid LAD and RCA; EF 40% by cardiac MRI  . Cardiac mri  09/2014    Moderately dilated LV.  EF 40%, multiple wall motion abnormalities, normal RV fxn.  Some areas of scarring.  . Left heart catheterization with coronary angiogram N/A 10/10/2014    Procedure:  LEFT HEART CATHETERIZATION WITH CORONARY ANGIOGRAM;  Surgeon: Peter M Swaziland, MD;  Location: [facility] CATH LAB;  Service: Cardiovascular;  Laterality: N/A;  . Left heart catheterization with coronary angiogram N/A 10/15/2014    Procedure: LEFT HEART CATHETERIZATION WITH CORONARY ANGIOGRAM;  Surgeon: Iran Ouch, MD;  Location: [facility] CATH LAB;  Service: Cardiovascular;  Laterality: N/A;  . Carotid dopplers  04/10/15    1-35% bilat; normal subclavians    There were no vitals filed for this visit.  Visit Diagnosis:  Abnormality of gait  Ankle weakness      Subjective Assessment - 07/06/15 1013    Subjective Recieved his ankle brace this weekend and has been wearing it with his work shoes. States he hasn't stumbled at all since wearing it.    Currently in Pain? No/denies        TODAY'S TREATMENT TherEx- Side Stepping on Foam Beam x4 laps (continued IV on Lt but improved with brace) (intermittent HHA) Tandem walking on Foam Beam x4 laps (intermittent HHA) Standing on Blue Foam Toe Tapping to 8" Step x20 each foot Bil Heel/Toe Raises on Blue Foam with fingertip A x20 Fwd/Bwd walking in hallway with cook band x165 ft each way Side-stepping with green TB at ankles 20 ft x2 each way (pt attempting to keep Lt toes facing forward rather than turning out) Slow Alt Hip Abd with fingertip assist x10 on the floor, x10 on the blue foam Seated BAPS lvl 4, PF/DF, IV/EV w/ 5# PL, PM, AL, and AM 10x each       [facility] PT Assessment - 07/06/15 0001    Observation/Other Assessments   Focus on Therapeutic Outcomes (FOTO)  44% limitation CK          PT Short Term Goals - 06/05/15 1010    PT SHORT TERM GOAL #1   Title pt independent with initial HEP by 06/10/15   Status Achieved           PT Long Term Goals - 07/02/15 1339    PT LONG TERM GOAL #1   Title pt scores 50/56 or better with Berg assessment by 07/13/15   Status On-going   PT LONG TERM GOAL #2   Title pt displays B ankle MMT 4/5 or  greater all planes by 07/13/15   Status On-going   PT LONG TERM GOAL #3   Title pt denies limiting level of activity due to fear of falling by 07/13/15   Status On-going   PT LONG TERM GOAL #4   Title pt able to ambulate over level and uneven terrain with good mechanics without need for AD and distances not limited by  fear of falling by 07/13/15   Status On-going               Plan - 07/06/15 1050    Clinical Impression Statement Improved ankle stability with brace during balance exercises but ankle still tenders to IV with side-stepping on balance beam.    PT Next Visit Plan ankle and hip strengthening, gait and balance training; make certain pt purchases ankle brace and is using appropriately   Consulted and Agree with Plan of Care Patient        Problem List Patient Active Problem List   Diagnosis Date Noted  . Vasovagal near syncope 06/29/2015  . Carotid bruit present 04/30/2015  . Chronic systolic heart failure 11/03/2014  . CAD (coronary artery disease) 10/24/2014  . Hyperkalemia 10/24/2014  . NSTEMI (non-ST elevated myocardial infarction) 10/20/2014  . Cardiomyopathy, ischemic 10/20/2014  . Anemia due to other cause 10/06/2014  . Liver cirrhosis secondary to NASH 09/18/2014  . Diaphoresis 09/18/2014  . Abnormal finding on EKG 07/19/2014  . Ascites 07/19/2014  . Cardiomyopathy 07/19/2014  . Elevated troponin 07/19/2014  . Dehydration 07/18/2014  . Gastroenteritis 04/14/2014  . Postprandial bloating 04/14/2014  . Chronic renal insufficiency, stage II (mild) 01/12/2014  . History of pancreatitis 10/07/2013  . Diabetes mellitus type 2, uncontrolled, with complications 01/07/2012  . Cirrhosis 11/07/2011  . Peripheral neuropathy 09/12/2011  . Thrombocytopenia 07/14/2011  . HTN (hypertension), benign 06/30/2011    Ronney Lion, PTA 07/06/2015, 11:50 AM  [facility] [address]  [address] [place], [place], [address] Phone: [phone]   Fax:  [phone]

## 2015-07-09 ENCOUNTER — Ambulatory Visit: Payer: 59 | Admitting: Physical Therapy

## 2015-07-09 ENCOUNTER — Telehealth: Payer: Self-pay | Admitting: Internal Medicine

## 2015-07-09 DIAGNOSIS — R269 Unspecified abnormalities of gait and mobility: Secondary | ICD-10-CM

## 2015-07-09 DIAGNOSIS — R29898 Other symptoms and signs involving the musculoskeletal system: Secondary | ICD-10-CM

## 2015-07-09 NOTE — Telephone Encounter (Signed)
Spoke with the [facility] and let her know he has an appointment with the CHF clinic on 8/01.  She says the patient will not call her back.  She is going to call CHF to discuss his edema

## 2015-07-09 NOTE — Therapy (Signed)
[facility] [address]  [place], [place], [address] Phone: [phone]   Fax:  [phone]  Physical Therapy Treatment  Patient Details  Name: [name] MRN: [id number] Date of Birth: July 11, 1961 Referring Provider:  Larey Dresser, MD  Encounter Date: 07/09/2015      PT End of Session - 07/09/15 1022    Visit Number 11   Number of Visits 19   Date for PT Re-Evaluation 08/07/15   PT Start Time 106   PT Stop Time 1106   PT Time Calculation (min) 47 min      Past Medical History  Diagnosis Date  . Heart murmur     ECHO 07/2011 showed mild AS and LVH.  TEE 09/2011 showed small PFO, bicuspid aortic valve but no stenosis or regurg.  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  Gen surg and GI recommended flagyl and cipro x 2 wks..  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  . Cholelithiasis     u/s--no cholecystitis.  06/2014 u/s showed no stones, only GB sludge  . Peripheral neuropathy     Diabetic:  Autonomic (pelvic) and LE polyneuropathy--[facility]UB testing showed that it is likely from DM  . Diabetic foot ulcers     Poor healing; normal ABIs and TBIs  . Arthritis     back L3 - L5  . Hyperactive gag reflex   . History of pyelonephritis   . Type II or unspecified type diabetes mellitus with neurological manifestations, not stated as uncontrolled     IDDM: neuro, renal, and ophth complications  . Wears dentures     upper  . Osteomyelitis of toe of left foot     left 4th and 5th toes--now s/p amputation of these areas  . Diabetic retinopathy associated with type 2 diabetes mellitus     Laser tx in both eyes (Dr. Baird Cancer)  . Diabetic nephropathy     Proteinuria and CrCl 55-65  . Chronic renal insufficiency, stage III (moderate)     CrCl 50s-80s  2014 to pre-VFIB arrest 10/02/14, at which time he sustained AKI.  . Lumbar spondylosis     MRI 08/25/11 with multilevel facet dz, small disc protrusion at L5-S1 to the right without impingement  . Ischemic cardiomyopathy 07/22/14    Myoview w/out ischemia but large scar (inferior/apex) w/ global hypokinesis--medical mgmt  . History of pancreatitis 2014 and 2015    Suspected biliary: passing small gallstones: surgery declined to remove GB 07/2014.  . Aspiration pneumonia   . Thrombocytopenia   . Portal hypertension   . Liver cirrhosis secondary to NASH     ?+ alcohol?.  With hx of splenomegaly and ascities (dx approx 2012)  . History of gram negative sepsis 2015    e coli  . Ventricular fibrillation 10/02/14    Resuscitated, hospitalized, 3 V CAD detected, DES stent to mid LAD and to RCA were placed.    Past Surgical History  Procedure Laterality Date  . Knee arthroscopy  2001    Bilateral  . Amputation  10/24/2012    Procedure: AMPUTATION RAY;  Surgeon: Colin Rhein, MD;  Location: [facility];  Service: Orthopedics;  Laterality: Left;  left 4th toe amputation through MTP joint, 5th Ray amputation   . Knee surgery  2001  .  Hida scan  06/2014    Normal  . Transthoracic echocardiogram  07/19/14;01/07/15    Mild LV dilation and hypertrophy, EF 35-40%, no wall motion abnormalities, grade II diast dysfxn, bicuspid aortic valve with mild AS and mild AI.  No change on 12/2014 echo.  . Cardiovascular stress test  07/2014    Myoview w/out ischemia but large scar (inferior/apex) w/ global hypokinesis--medical mgmt  . Eye surgery  08/2014    Retinal hemorrhage evacuation  . Coronary angioplasty with stent placement  09/2014    DES to mid LAD and RCA; EF 40% by cardiac MRI  . Cardiac mri  09/2014    Moderately dilated LV.  EF 40%, multiple wall motion abnormalities, normal RV fxn.  Some areas of scarring.  . Left heart catheterization with coronary angiogram N/A 10/10/2014    Procedure:  LEFT HEART CATHETERIZATION WITH CORONARY ANGIOGRAM;  Surgeon: Peter M Martinique, MD;  Location: [facility] CATH LAB;  Service: Cardiovascular;  Laterality: N/A;  . Left heart catheterization with coronary angiogram N/A 10/15/2014    Procedure: LEFT HEART CATHETERIZATION WITH CORONARY ANGIOGRAM;  Surgeon: Wellington Hampshire, MD;  Location: [facility] CATH LAB;  Service: Cardiovascular;  Laterality: N/A;  . Carotid dopplers  04/10/15    1-35% bilat; normal subclavians    There were no vitals filed for this visit.  Visit Diagnosis:  Abnormality of gait  Ankle weakness      Subjective Assessment - 07/09/15 1020    Subjective states walks at mall daily for aprox 1 hour (1.7 miles per fitbit) and goes to gym for resistance training 3x/wk   Currently in Pain? No/denies            [facility] PT Assessment - 07/09/15 0001    Strength   Right Hip Flexion 4+/5   Right Hip Extension 5/5   Right Hip ABduction 4+/5   Right Hip ADduction 5/5   Left Hip Flexion 4/5   Left Hip Extension 4+/5   Left Hip ABduction 4/5   Left Hip ADduction 4+/5   Right Ankle Dorsiflexion 4/5   Right Ankle Plantar Flexion 3/5   Right Ankle Inversion 4+/5   Right Ankle Eversion 4/5   Left Ankle Dorsiflexion 4-/5   Left Ankle Plantar Flexion 3/5   Left Ankle Inversion 3+/5   Left Ankle Eversion 3-/5   Berg Balance Test   Sit to Stand Able to stand without using hands and stabilize independently   Standing Unsupported Able to stand safely 2 minutes   Sitting with Back Unsupported but Feet Supported on Floor or Stool Able to sit safely and securely 2 minutes   Stand to Sit Sits safely with minimal use of hands   Transfers Able to transfer safely, minor use of hands   Standing Unsupported with Eyes Closed Able to stand 10 seconds safely   Standing Ubsupported with Feet Together Able to place feet together independently and stand 1 minute safely   From Standing, Reach Forward with Outstretched Arm Can reach confidently >25 cm (10")    From Standing Position, Pick up Object from Floor Able to pick up shoe safely and easily   From Standing Position, Turn to Look Behind Over each Shoulder Turn sideways only but maintains balance   Turn 360 Degrees Able to turn 360 degrees safely in 4 seconds or less   Standing Unsupported, Alternately Place Feet on Step/Stool Able to stand independently and safely and complete 8 steps in 20 seconds   Standing Unsupported, One Foot in  Front Able to plae foot ahead of the other independently and hold 30 seconds   Standing on One Leg Tries to lift leg/unable to hold 3 seconds but remains standing independently  nearly able to hold 3" on R   Total Score 50         TODAY'S TREATMENT Berg Assessment MMT Assessment  TherAct - Addressing pt's inability to get up from floor:  Lunge training: B Pole A, Knee tapping to FP and Blue disc 3x each then with FP only 2x 2 tap B (initially unable with R but able in 2nd set) Lunge without Pole A 5x tapping L knee to FP and Blue disc with CGA Lunge with Single Pole A R knee tapping to FP and Blue Disc 2x5 with CGA Floor->Knuckle lift Box + 0# 5x, + 10# 5x  TherEx - TRX DL Squat 10x TRX Squat DL down SL up 4x each TRX Squat SL down DL up 4x each TRX Squat hold at bottom with ALT leg lift 3x3" each                   PT Short Term Goals - 06/05/15 1010    PT SHORT TERM GOAL #1   Title pt independent with initial HEP by 06/10/15   Status Achieved           PT Long Term Goals - 07/09/15 1101    PT LONG TERM GOAL #1   Title pt scores 50/56 or better with Merrilee Jansky assessment   Status Achieved   PT LONG TERM GOAL #2   Title pt displays B ankle MMT 4/5 or greater all planes by 08/07/15   Status On-going   PT LONG TERM GOAL #3   Title pt denies limiting level of activity due to fear of falling by 08/07/15  good improvement, fear now is with inability to get up/down from ground without pulling self up.   Status Partially Met   PT LONG TERM  GOAL #4   Title pt able to ambulate over level and uneven terrain with good mechanics without need for AD and distances not limited by fear of falling by 08/07/15  states feels fine over level terrain but still fearful over uneven terrain, does not require AD   Status Partially Met   PT LONG TERM GOAL #5   Title pt able to get up/down from ground without need for UE on furniture or any other AD by 08/07/15   Status New               Plan - 07/09/15 1115    Clinical Impression Statement pt displays improved balance per Merrilee Jansky (improved to 50/56 from 43/56) and also with improved B hip MMT in some planes.  No changes noted in Ankle MMT.  Pt's level of fear/anxiety regarding falling has improved significantly with ambulation and many functional activities, however, is still an issue with walking over uneven terrain and with floor-level transfers.  Pt currently unable to get up/down from the floor or even from 1/2 kneeling positiong without heavy use of UE on furniture or other elevated surface.  He is able to ambulate safely without need for AD since he purchased ankle brace for L ankle.  B Ankle strength quite limited and has not improved.  This may not improve in the future due to peripheral neurapathy which will likely prevent normal level of balance.  Pt is concerned about ability to return to work.  He states his job requires  frequent lifing up to 50# and also requires him to be able to to get up/down from kneeling position.  We have recently progressed pt's POC to address these limitations.  He has attended 11 treatments to date and I am hopeful that an additional 4 weeks of PT at 2x/wk along with HEP performance should allow pt to acheive remaining goals.   Pt will benefit from skilled therapeutic intervention in order to improve on the following deficits Decreased balance;Decreased strength;Abnormal gait   Rehab Potential Good   PT Frequency 2x / week   PT Duration 4 weeks   PT  Treatment/Interventions Therapeutic exercise;Therapeutic activities;Balance training;Neuromuscular re-education;Gait training;Patient/family education;ADLs/Self Care Home Management   PT Next Visit Plan ankle and hip strengthening, gait and balance training; work on floor level transfers and box lifting   Consulted and Agree with Plan of Care Patient        Problem List Patient Active Problem List   Diagnosis Date Noted  . Vasovagal near syncope 06/29/2015  . Carotid bruit present 04/30/2015  . Chronic systolic heart failure 52/84/1324  . CAD (coronary artery disease) 10/24/2014  . Hyperkalemia 10/24/2014  . NSTEMI (non-ST elevated myocardial infarction) 10/20/2014  . Cardiomyopathy, ischemic 10/20/2014  . Anemia due to other cause 10/06/2014  . Liver cirrhosis secondary to NASH 09/18/2014  . Diaphoresis 09/18/2014  . Abnormal finding on EKG 07/19/2014  . Ascites 07/19/2014  . Cardiomyopathy 07/19/2014  . Elevated troponin 07/19/2014  . Dehydration 07/18/2014  . Gastroenteritis 04/14/2014  . Postprandial bloating 04/14/2014  . Chronic renal insufficiency, stage II (mild) 01/12/2014  . History of pancreatitis 10/07/2013  . Diabetes mellitus type 2, uncontrolled, with complications 40/09/2724  . Cirrhosis 11/07/2011  . Peripheral neuropathy 09/12/2011  . Thrombocytopenia 07/14/2011  . HTN (hypertension), benign 06/30/2011    Kiowa Hollar PT, OCS 07/09/2015, 11:30 AM  [facility] [address]  [place], [place], [address] Phone: [phone]   Fax:  [phone]

## 2015-07-09 NOTE — Telephone Encounter (Signed)
New Message      Calling to find out if pt has been following up w/ Dr. Mayford Knife regularly. Offered to transfer to medical records so she could request that information and she stated that she couldn't wait for a medical records request and she needed to speak with a nurse now. Call transferred to [facility].

## 2015-07-13 ENCOUNTER — Ambulatory Visit: Payer: 59 | Admitting: Rehabilitation

## 2015-07-13 DIAGNOSIS — R29898 Other symptoms and signs involving the musculoskeletal system: Secondary | ICD-10-CM

## 2015-07-13 DIAGNOSIS — R269 Unspecified abnormalities of gait and mobility: Secondary | ICD-10-CM

## 2015-07-13 NOTE — Therapy (Signed)
[facility] [address]  [place], [place], [address] Phone: [phone]   Fax:  [phone]  Physical Therapy Treatment  Patient Details  Name: [name] MRN: [id number] Date of Birth: [birth year] Referring Provider:  Larey Dresser, MD  Encounter Date: 07/13/2015      PT End of Session - 07/13/15 0929    Visit Number 12   Number of Visits 19   Date for PT Re-Evaluation 08/07/15   PT Start Time 0925   PT Stop Time 1007   PT Time Calculation (min) 42 min   Activity Tolerance Patient tolerated treatment well   Behavior During Therapy [facility] for tasks assessed/performed      Past Medical History  Diagnosis Date  . Heart murmur     ECHO 07/2011 showed mild AS and LVH.  TEE 09/2011 showed small PFO, bicuspid aortic valve but no stenosis or regurg.  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  Gen surg and GI recommended flagyl and cipro x 2 wks..  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  . Cholelithiasis     u/s--no cholecystitis.  06/2014 u/s showed no stones, only GB sludge  . Peripheral neuropathy     Diabetic:  Autonomic (pelvic) and LE polyneuropathy--[facility] testing showed that it is likely from DM  . Diabetic foot ulcers     Poor healing; normal ABIs and TBIs  . Arthritis     back L3 - L5  . Hyperactive gag reflex   . History of pyelonephritis   . Type II or unspecified type diabetes mellitus with neurological manifestations, not stated as uncontrolled     IDDM: neuro, renal, and ophth complications  . Wears dentures     upper  . Osteomyelitis of toe of left foot     left 4th and 5th toes--now s/p amputation of these areas  . Diabetic retinopathy associated with type 2 diabetes mellitus     Laser tx in both eyes (Dr. Baird Cancer)  . Diabetic  nephropathy     Proteinuria and CrCl 55-65  . Chronic renal insufficiency, stage III (moderate)     CrCl 50s-80s 2014 to pre-VFIB arrest 10/02/14, at which time he sustained AKI.  . Lumbar spondylosis     MRI 08/25/11 with multilevel facet dz, small disc protrusion at L5-S1 to the right without impingement  . Ischemic cardiomyopathy 07/22/14    Myoview w/out ischemia but large scar (inferior/apex) w/ global hypokinesis--medical mgmt  . History of pancreatitis 2014 and 2015    Suspected biliary: passing small gallstones: surgery declined to remove GB 07/2014.  . Aspiration pneumonia   . Thrombocytopenia   . Portal hypertension   . Liver cirrhosis secondary to NASH     ?+ alcohol?.  With hx of splenomegaly and ascities (dx approx 2012)  . History of gram negative sepsis 2015    e coli  . Ventricular fibrillation 10/02/14    Resuscitated, hospitalized, 3 V CAD detected, DES stent to mid LAD and to RCA were placed.    Past Surgical History  Procedure Laterality Date  . Knee arthroscopy  2001    Bilateral  . Amputation  10/24/2012    Procedure: AMPUTATION RAY;  Surgeon: Colin Rhein, MD;  Location: [facility];  Service: Orthopedics;  Laterality: Left;  left 4th toe amputation through MTP joint, 5th Ray amputation   . Knee surgery  2001  . Hida scan  06/2014    Normal  . Transthoracic echocardiogram  07/19/14;01/07/15    Mild LV dilation and hypertrophy, EF 35-40%, no wall motion abnormalities, grade II diast dysfxn, bicuspid aortic valve with mild AS and mild AI.  No change on 12/2014 echo.  . Cardiovascular stress test  07/2014    Myoview w/out ischemia but large scar (inferior/apex) w/ global hypokinesis--medical mgmt  . Eye surgery  08/2014    Retinal hemorrhage evacuation  . Coronary angioplasty with stent placement  09/2014    DES to mid LAD and RCA; EF 40% by cardiac MRI  . Cardiac mri  09/2014    Moderately dilated LV.  EF 40%, multiple wall motion abnormalities, normal  RV fxn.  Some areas of scarring.  . Left heart catheterization with coronary angiogram N/A 10/10/2014    Procedure: LEFT HEART CATHETERIZATION WITH CORONARY ANGIOGRAM;  Surgeon: Peter M Martinique, MD;  Location: [facility] CATH LAB;  Service: Cardiovascular;  Laterality: N/A;  . Left heart catheterization with coronary angiogram N/A 10/15/2014    Procedure: LEFT HEART CATHETERIZATION WITH CORONARY ANGIOGRAM;  Surgeon: Wellington Hampshire, MD;  Location: [facility] CATH LAB;  Service: Cardiovascular;  Laterality: N/A;  . Carotid dopplers  04/10/15    1-35% bilat; normal subclavians    There were no vitals filed for this visit.  Visit Diagnosis:  Abnormality of gait  Ankle weakness      Subjective Assessment - 07/13/15 0929    Subjective Reports he is very sore today from last week. Noted he worked out at [employer] last week and thinks that combined with the therapy made him super sore. Had to rest all day Saturday due to legs feeling weak/wanting to give out.    Currently in Pain? No/denies       TODAY'S TREATMENT TherEx- Nustep level 5x6' UE/LE   TherAct- Floor-> Plinth lift Box + 10# 5x, + 15# 5x, + 20# 5x  Plinth-> counter lift/carry Box + 15# 5x  TherEx- TRX DL Squat 10x TRX Partial SL Squat 10x Side Stepping with Green TB 20 ft each way Heel Raises at UBE: DL 15x, SL on Rt 10x, Eccentric lower on Lt 10x Doorway Lunges 10x each leg Slow March on Mini Tramp 15x with 1 HHA       PT Short Term Goals - 06/05/15 1010    PT SHORT TERM GOAL #1   Title pt independent with initial HEP by 06/10/15   Status Achieved           PT Long Term Goals - 07/09/15 1101    PT LONG TERM GOAL #1   Title pt scores 50/56 or better with Merrilee Jansky assessment   Status Achieved   PT LONG TERM GOAL #2   Title pt displays B ankle MMT 4/5 or greater all planes by 08/07/15   Status On-going   PT LONG TERM GOAL #3   Title pt denies limiting level of activity due to fear of falling by 08/07/15  good improvement, fear now  is with inability to get up/down from ground without pulling self up.   Status Partially Met   PT LONG TERM GOAL #4   Title pt able to ambulate over level and uneven terrain with good mechanics without need for AD and distances not limited by fear of falling by 08/07/15  states feels fine over level terrain but still  fearful over uneven terrain, does not require AD   Status Partially Met   PT LONG TERM GOAL #5   Title pt able to get up/down from ground without need for UE on furniture or any other AD by 08/07/15   Status New               Plan - 07/13/15 1007    Clinical Impression Statement Continues to fatigue quickly with lifting exercises requiring more rest breaks but demonstrated good mechanics with squat lifts and carry from plinth to table. Difficulty noted with doorway lunges and partial SL squats (pt unable to perform SL).    PT Next Visit Plan ankle and hip strengthening, gait and balance training; work on floor level transfers and box lifting   Consulted and Agree with Plan of Care Patient        Problem List Patient Active Problem List   Diagnosis Date Noted  . Vasovagal near syncope 06/29/2015  . Carotid bruit present 04/30/2015  . Chronic systolic heart failure 63/84/5364  . CAD (coronary artery disease) 10/24/2014  . Hyperkalemia 10/24/2014  . NSTEMI (non-ST elevated myocardial infarction) 10/20/2014  . Cardiomyopathy, ischemic 10/20/2014  . Anemia due to other cause 10/06/2014  . Liver cirrhosis secondary to NASH 09/18/2014  . Diaphoresis 09/18/2014  . Abnormal finding on EKG 07/19/2014  . Ascites 07/19/2014  . Cardiomyopathy 07/19/2014  . Elevated troponin 07/19/2014  . Dehydration 07/18/2014  . Gastroenteritis 04/14/2014  . Postprandial bloating 04/14/2014  . Chronic renal insufficiency, stage II (mild) 01/12/2014  . History of pancreatitis 10/07/2013  . Diabetes mellitus type 2, uncontrolled, with complications 68/02/2121  . Cirrhosis 11/07/2011   . Peripheral neuropathy 09/12/2011  . Thrombocytopenia 07/14/2011  . HTN (hypertension), benign 06/30/2011    Barbette Hair, PTA 07/13/2015, 10:09 AM  [facility] [address]  [place], [place], [address] Phone: [phone]   Fax:  [phone]

## 2015-07-16 ENCOUNTER — Ambulatory Visit: Payer: 59 | Admitting: Physical Therapy

## 2015-07-16 DIAGNOSIS — R269 Unspecified abnormalities of gait and mobility: Secondary | ICD-10-CM | POA: Diagnosis not present

## 2015-07-16 DIAGNOSIS — R29898 Other symptoms and signs involving the musculoskeletal system: Secondary | ICD-10-CM

## 2015-07-16 NOTE — Therapy (Signed)
[facility] [address]  [place], [place], [address] Phone: [phone]   Fax:  [phone]  Physical Therapy Treatment  Patient Details  Name: [name] MRN: [id number] Date of Birth: 1961-05-25 Referring Provider:  Larey Dresser, MD  Encounter Date: 07/16/2015      PT End of Session - 07/16/15 1004    Visit Number 13   Number of Visits 19   Date for PT Re-Evaluation 08/07/15   PT Start Time 1000   PT Stop Time 1050   PT Time Calculation (min) 50 min      Past Medical History  Diagnosis Date  . Heart murmur     ECHO 07/2011 showed mild AS and LVH.  TEE 09/2011 showed small PFO, bicuspid aortic valve but no stenosis or regurg.  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  Gen surg and GI recommended flagyl and cipro x 2 wks..  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  . Cholelithiasis     u/s--no cholecystitis.  06/2014 u/s showed no stones, only GB sludge  . Peripheral neuropathy     Diabetic:  Autonomic (pelvic) and LE polyneuropathy--WFUB testing showed that it is likely from DM  . Diabetic foot ulcers     Poor healing; normal ABIs and TBIs  . Arthritis     back L3 - L5  . Hyperactive gag reflex   . History of pyelonephritis   . Type II or unspecified type diabetes mellitus with neurological manifestations, not stated as uncontrolled     IDDM: neuro, renal, and ophth complications  . Wears dentures     upper  . Osteomyelitis of toe of left foot     left 4th and 5th toes--now s/p amputation of these areas  . Diabetic retinopathy associated with type 2 diabetes mellitus     Laser tx in both eyes (Dr. Baird Cancer)  . Diabetic nephropathy     Proteinuria and CrCl 55-65  . Chronic renal insufficiency, stage III (moderate)     CrCl 50s-80s  2014 to pre-VFIB arrest 10/02/14, at which time he sustained AKI.  . Lumbar spondylosis     MRI 08/25/11 with multilevel facet dz, small disc protrusion at L5-S1 to the right without impingement  . Ischemic cardiomyopathy 07/22/14    Myoview w/out ischemia but large scar (inferior/apex) w/ global hypokinesis--medical mgmt  . History of pancreatitis 2014 and 2015    Suspected biliary: passing small gallstones: surgery declined to remove GB 07/2014.  . Aspiration pneumonia   . Thrombocytopenia   . Portal hypertension   . Liver cirrhosis secondary to NASH     ?+ alcohol?.  With hx of splenomegaly and ascities (dx approx 2012)  . History of gram negative sepsis 2015    e coli  . Ventricular fibrillation 10/02/14    Resuscitated, hospitalized, 3 V CAD detected, DES stent to mid LAD and to RCA were placed.    Past Surgical History  Procedure Laterality Date  . Knee arthroscopy  2001    Bilateral  . Amputation  10/24/2012    Procedure: AMPUTATION RAY;  Surgeon: Colin Rhein, MD;  Location: [facility];  Service: Orthopedics;  Laterality: Left;  left 4th toe amputation through MTP joint, 5th Ray amputation   . Knee surgery  2001  .  Hida scan  06/2014    Normal  . Transthoracic echocardiogram  07/19/14;01/07/15    Mild LV dilation and hypertrophy, EF 35-40%, no wall motion abnormalities, grade II diast dysfxn, bicuspid aortic valve with mild AS and mild AI.  No change on 12/2014 echo.  . Cardiovascular stress test  07/2014    Myoview w/out ischemia but large scar (inferior/apex) w/ global hypokinesis--medical mgmt  . Eye surgery  08/2014    Retinal hemorrhage evacuation  . Coronary angioplasty with stent placement  09/2014    DES to mid LAD and RCA; EF 40% by cardiac MRI  . Cardiac mri  09/2014    Moderately dilated LV.  EF 40%, multiple wall motion abnormalities, normal RV fxn.  Some areas of scarring.  . Left heart catheterization with coronary angiogram N/A 10/10/2014    Procedure:  LEFT HEART CATHETERIZATION WITH CORONARY ANGIOGRAM;  Surgeon: Peter M Martinique, MD;  Location: [facility] CATH LAB;  Service: Cardiovascular;  Laterality: N/A;  . Left heart catheterization with coronary angiogram N/A 10/15/2014    Procedure: LEFT HEART CATHETERIZATION WITH CORONARY ANGIOGRAM;  Surgeon: Wellington Hampshire, MD;  Location: [facility] CATH LAB;  Service: Cardiovascular;  Laterality: N/A;  . Carotid dopplers  04/10/15    1-35% bilat; normal subclavians    There were no vitals filed for this visit.  Visit Diagnosis:  Abnormality of gait  Ankle weakness      Subjective Assessment - 07/16/15 1001    Subjective pt states he wanted to attend water aerobics class at [employer] but gym staff wouldn't let him until he obtains MD note stating he is safe to do so.  States notes some mild B LE soreness today due to going to gym yesterday (on stationary bike for 20 minutes and performed LE resistance training exercises).   Currently in Pain? No/denies              TODAY'S TREATMENT TherEx- Nustep level 6x5' UE/LE (HR up to 128) TRX DL Squat 12x Standing HS and Quad stretches TRX SL Squat with contra knee tapping on Blue disc on 9" stool 3x5 each (very difficult with L LE, difficult with R but able to perform at slower pace and without tapping L toes each rep) Wall sits with ALT Knee Ext 3x3" each for 2 sets Bridge on Heels 10x5" Bridge with Alt Knee Ext 3x3" each for 3 sets Side Bridge (Elbow and Knees) 5x3" each Quadruped UE/LE 10x3" Slow March on Mini Tramp 20x without A (exp'd one LOB requiring B hands on bar assist) Side-Stepping in grapevine pattern 2x20' each                   PT Short Term Goals - 06/05/15 1010    PT SHORT TERM GOAL #1   Title pt independent with initial HEP by 06/10/15   Status Achieved           PT Long Term Goals - 07/16/15 1110    PT LONG TERM GOAL #1   Title pt scores 50/56 or better with Merrilee Jansky assessment   Status Achieved   PT LONG TERM  GOAL #2   Title pt displays B ankle MMT 4/5 or greater all planes by 08/07/15   Status On-going   PT LONG TERM GOAL #3   Title pt denies limiting level of activity due to fear of falling by 08/07/15   Status On-going   PT LONG TERM GOAL #4   Title pt able to ambulate over  level and uneven terrain with good mechanics without need for AD and distances not limited by fear of falling by 08/07/15   Status Partially Met   PT LONG TERM GOAL #5   Title pt able to get up/down from ground without need for UE on furniture or any other AD by 08/07/15   Status On-going               Plan - 07/16/15 1050    Clinical Impression Statement Pt states continues to have difficulty with ascending stairs stating LEs fatigue by top of one flight of stairs and feel wobbly.  Pt unable to get up/down from floor and this has been focus of treatments lately.  Issue is combination of LE weakness as well as instability in this position requiring heavy UE assist.  Pt's job prior to MI requires performance of kneeling type activities as well as great deal of lifting, pushing, pulling, etc.  Pt is making good progress in level of function and balance but still quite restricted by LE weakness/rapid fatigue along with L ankle instability.  Pt has been wearing ankle brace to L ankle which provides support and pt notes less ankle rolling with ambulation.  Pt has attended 13 treatments and has 6 remaining on his POC.  I'm not confident that pt will be at prior level of function by that time but I do feel he should be able to perform floor-level transfers and should be independent with continued progress by that time.   Pt will benefit from skilled therapeutic intervention in order to improve on the following deficits Decreased balance;Decreased strength;Abnormal gait   Rehab Potential Good   PT Frequency 2x / week   PT Duration 3 weeks   PT Treatment/Interventions Therapeutic exercise;Therapeutic activities;Balance  training;Neuromuscular re-education;Gait training;Patient/family education;ADLs/Self Care Home Management   PT Next Visit Plan ankle and hip strengthening, gait and balance training; work on floor level transfers and box lifting   Consulted and Agree with Plan of Care Patient        Problem List Patient Active Problem List   Diagnosis Date Noted  . Vasovagal near syncope 06/29/2015  . Carotid bruit present 04/30/2015  . Chronic systolic heart failure 61/53/7943  . CAD (coronary artery disease) 10/24/2014  . Hyperkalemia 10/24/2014  . NSTEMI (non-ST elevated myocardial infarction) 10/20/2014  . Cardiomyopathy, ischemic 10/20/2014  . Anemia due to other cause 10/06/2014  . Liver cirrhosis secondary to NASH 09/18/2014  . Diaphoresis 09/18/2014  . Abnormal finding on EKG 07/19/2014  . Ascites 07/19/2014  . Cardiomyopathy 07/19/2014  . Elevated troponin 07/19/2014  . Dehydration 07/18/2014  . Gastroenteritis 04/14/2014  . Postprandial bloating 04/14/2014  . Chronic renal insufficiency, stage II (mild) 01/12/2014  . History of pancreatitis 10/07/2013  . Diabetes mellitus type 2, uncontrolled, with complications 27/61/4709  . Cirrhosis 11/07/2011  . Peripheral neuropathy 09/12/2011  . Thrombocytopenia 07/14/2011  . HTN (hypertension), benign 06/30/2011    Husna Krone PT, OCS 07/16/2015, 11:12 AM  [facility] [address]  [place], [place], [address] Phone: [phone]   Fax:  [phone]

## 2015-07-20 ENCOUNTER — Encounter (HOSPITAL_COMMUNITY): Payer: Self-pay | Admitting: *Deleted

## 2015-07-20 ENCOUNTER — Ambulatory Visit (HOSPITAL_COMMUNITY)
Admission: RE | Admit: 2015-07-20 | Discharge: 2015-07-20 | Disposition: A | Discharge status: Home / Self Care | Payer: 59 | Source: Ambulatory Visit | Attending: Cardiology | Admitting: Cardiology

## 2015-07-20 ENCOUNTER — Ambulatory Visit: Payer: 59 | Attending: Cardiology | Admitting: Rehabilitation

## 2015-07-20 ENCOUNTER — Encounter (HOSPITAL_COMMUNITY): Payer: Self-pay

## 2015-07-20 VITALS — BP 140/71 | HR 67 | Resp 18 | Wt 232.8 lb

## 2015-07-20 DIAGNOSIS — Z7982 Long term (current) use of aspirin: Secondary | ICD-10-CM | POA: Diagnosis not present

## 2015-07-20 DIAGNOSIS — R2689 Other abnormalities of gait and mobility: Secondary | ICD-10-CM | POA: Diagnosis not present

## 2015-07-20 DIAGNOSIS — K7460 Unspecified cirrhosis of liver: Secondary | ICD-10-CM | POA: Insufficient documentation

## 2015-07-20 DIAGNOSIS — Z955 Presence of coronary angioplasty implant and graft: Secondary | ICD-10-CM | POA: Insufficient documentation

## 2015-07-20 DIAGNOSIS — I5022 Chronic systolic (congestive) heart failure: Secondary | ICD-10-CM | POA: Insufficient documentation

## 2015-07-20 DIAGNOSIS — N189 Chronic kidney disease, unspecified: Secondary | ICD-10-CM | POA: Insufficient documentation

## 2015-07-20 DIAGNOSIS — Z794 Long term (current) use of insulin: Secondary | ICD-10-CM | POA: Diagnosis not present

## 2015-07-20 DIAGNOSIS — Z7902 Long term (current) use of antithrombotics/antiplatelets: Secondary | ICD-10-CM | POA: Insufficient documentation

## 2015-07-20 DIAGNOSIS — R161 Splenomegaly, not elsewhere classified: Secondary | ICD-10-CM | POA: Diagnosis not present

## 2015-07-20 DIAGNOSIS — I255 Ischemic cardiomyopathy: Secondary | ICD-10-CM | POA: Insufficient documentation

## 2015-07-20 DIAGNOSIS — E875 Hyperkalemia: Secondary | ICD-10-CM | POA: Insufficient documentation

## 2015-07-20 DIAGNOSIS — R269 Unspecified abnormalities of gait and mobility: Secondary | ICD-10-CM | POA: Insufficient documentation

## 2015-07-20 DIAGNOSIS — R29898 Other symptoms and signs involving the musculoskeletal system: Secondary | ICD-10-CM

## 2015-07-20 DIAGNOSIS — Z8674 Personal history of sudden cardiac arrest: Secondary | ICD-10-CM | POA: Diagnosis not present

## 2015-07-20 DIAGNOSIS — E1140 Type 2 diabetes mellitus with diabetic neuropathy, unspecified: Secondary | ICD-10-CM | POA: Diagnosis not present

## 2015-07-20 DIAGNOSIS — M259 Joint disorder, unspecified: Secondary | ICD-10-CM | POA: Insufficient documentation

## 2015-07-20 DIAGNOSIS — E1122 Type 2 diabetes mellitus with diabetic chronic kidney disease: Secondary | ICD-10-CM | POA: Diagnosis not present

## 2015-07-20 DIAGNOSIS — D696 Thrombocytopenia, unspecified: Secondary | ICD-10-CM | POA: Diagnosis not present

## 2015-07-20 DIAGNOSIS — I2510 Atherosclerotic heart disease of native coronary artery without angina pectoris: Secondary | ICD-10-CM | POA: Diagnosis not present

## 2015-07-20 DIAGNOSIS — Q231 Congenital insufficiency of aortic valve: Secondary | ICD-10-CM | POA: Insufficient documentation

## 2015-07-20 DIAGNOSIS — R0989 Other specified symptoms and signs involving the circulatory and respiratory systems: Secondary | ICD-10-CM | POA: Insufficient documentation

## 2015-07-20 LAB — BASIC METABOLIC PANEL
Anion gap: 5 (ref 5–15)
BUN: 42 mg/dL — AB (ref 6–20)
CO2: 25 mmol/L (ref 22–32)
Calcium: 8.6 mg/dL — ABNORMAL LOW (ref 8.9–10.3)
Chloride: 106 mmol/L (ref 101–111)
Creatinine, Ser: 1.72 mg/dL — ABNORMAL HIGH (ref 0.61–1.24)
GFR calc non Af Amer: 44 mL/min — ABNORMAL LOW (ref >60)
GFR, EST AFRICAN AMERICAN: 51 mL/min — AB (ref >60)
GLUCOSE: 225 mg/dL — AB (ref 65–99)
Potassium: 4.5 mmol/L (ref 3.5–5.1)
SODIUM: 136 mmol/L (ref 135–145)

## 2015-07-20 LAB — CBC
HCT: 33.3 % — ABNORMAL LOW (ref 39.0–52.0)
HEMOGLOBIN: 11.6 g/dL — AB (ref 13.0–17.0)
MCH: 30.9 pg (ref 26.0–34.0)
MCHC: 34.8 g/dL (ref 30.0–36.0)
MCV: 88.8 fL (ref 78.0–100.0)
Platelets: 42 10*3/uL — ABNORMAL LOW (ref 150–400)
RBC: 3.75 MIL/uL — ABNORMAL LOW (ref 4.22–5.81)
RDW: 12.7 % (ref 11.5–15.5)
WBC: 5.5 10*3/uL (ref 4.0–10.5)

## 2015-07-20 MED ORDER — LISINOPRIL 5 MG PO TABS: 5.0000 mg | ORAL_TABLET | Freq: Two times a day (BID) | ORAL | Status: DC

## 2015-07-20 NOTE — Therapy (Signed)
[facility] [address]  [place], [place], [address] Phone: [phone]   Fax:  [phone]  Physical Therapy Treatment  Patient Details  Name: [name] MRN: [id number] Date of Birth: 03-22-1961 Referring Provider:  Larey Dresser, MD  Encounter Date: 07/20/2015      PT End of Session - 07/20/15 1004    Visit Number 14   Number of Visits 19   Date for PT Re-Evaluation 08/07/15   PT Start Time 1000   PT Stop Time 1045   PT Time Calculation (min) 45 min      Past Medical History  Diagnosis Date  . Heart murmur     ECHO 07/2011 showed mild AS and LVH.  TEE 09/2011 showed small PFO, bicuspid aortic valve but no stenosis or regurg.  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  Gen surg and GI recommended flagyl and cipro x 2 wks..  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  . Cholelithiasis     u/s--no cholecystitis.  06/2014 u/s showed no stones, only GB sludge  . Peripheral neuropathy     Diabetic:  Autonomic (pelvic) and LE polyneuropathy--[facility] testing showed that it is likely from DM  . Diabetic foot ulcers     Poor healing; normal ABIs and TBIs  . Arthritis     back L3 - L5  . Hyperactive gag reflex   . History of pyelonephritis   . Type II or unspecified type diabetes mellitus with neurological manifestations, not stated as uncontrolled     IDDM: neuro, renal, and ophth complications  . Wears dentures     upper  . Osteomyelitis of toe of left foot     left 4th and 5th toes--now s/p amputation of these areas  . Diabetic retinopathy associated with type 2 diabetes mellitus     Laser tx in both eyes (Dr. Baird Cancer)  . Diabetic nephropathy     Proteinuria and CrCl 55-65  . Chronic renal insufficiency, stage III (moderate)     CrCl 50s-80s 2014  to pre-VFIB arrest 10/02/14, at which time he sustained AKI.  . Lumbar spondylosis     MRI 08/25/11 with multilevel facet dz, small disc protrusion at L5-S1 to the right without impingement  . Ischemic cardiomyopathy 07/22/14    Myoview w/out ischemia but large scar (inferior/apex) w/ global hypokinesis--medical mgmt  . History of pancreatitis 2014 and 2015    Suspected biliary: passing small gallstones: surgery declined to remove GB 07/2014.  . Aspiration pneumonia   . Thrombocytopenia   . Portal hypertension   . Liver cirrhosis secondary to NASH     ?+ alcohol?.  With hx of splenomegaly and ascities (dx approx 2012)  . History of gram negative sepsis 2015    e coli  . Ventricular fibrillation 10/02/14    Resuscitated, hospitalized, 3 V CAD detected, DES stent to mid LAD and to RCA were placed.    Past Surgical History  Procedure Laterality Date  . Knee arthroscopy  2001    Bilateral  . Amputation  10/24/2012    Procedure: AMPUTATION RAY;  Surgeon: Colin Rhein, MD;  Location: [facility];  Service: Orthopedics;  Laterality: Left;  left 4th toe amputation through MTP joint, 5th Ray amputation   . Knee surgery  2001  .  Hida scan  06/2014    Normal  . Transthoracic echocardiogram  07/19/14;01/07/15    Mild LV dilation and hypertrophy, EF 35-40%, no wall motion abnormalities, grade II diast dysfxn, bicuspid aortic valve with mild AS and mild AI.  No change on 12/2014 echo.  . Cardiovascular stress test  07/2014    Myoview w/out ischemia but large scar (inferior/apex) w/ global hypokinesis--medical mgmt  . Eye surgery  08/2014    Retinal hemorrhage evacuation  . Coronary angioplasty with stent placement  09/2014    DES to mid LAD and RCA; EF 40% by cardiac MRI  . Cardiac mri  09/2014    Moderately dilated LV.  EF 40%, multiple wall motion abnormalities, normal RV fxn.  Some areas of scarring.  . Left heart catheterization with coronary angiogram N/A 10/10/2014    Procedure: LEFT  HEART CATHETERIZATION WITH CORONARY ANGIOGRAM;  Surgeon: Peter M Martinique, MD;  Location: [facility] CATH LAB;  Service: Cardiovascular;  Laterality: N/A;  . Left heart catheterization with coronary angiogram N/A 10/15/2014    Procedure: LEFT HEART CATHETERIZATION WITH CORONARY ANGIOGRAM;  Surgeon: Wellington Hampshire, MD;  Location: [facility] CATH LAB;  Service: Cardiovascular;  Laterality: N/A;  . Carotid dopplers  04/10/15    1-35% bilat; normal subclavians    There were no vitals filed for this visit.  Visit Diagnosis:  Abnormality of gait  Ankle weakness      Subjective Assessment - 07/20/15 1003    Subjective Reports being sore from doing yardwork and working out over the weekend. Notes he becomes lightheaded when he works outside which causes him to stop and take breaks. Pt states that he wants to go back to work but is unsure if he can do all his duties/regular shift.   Currently in Pain? No/denies      TODAY'S TREATMENT TherEx- Nustep level 6x5' UE/LE (HR up to 135) Wall sits with ALT Knee Ext 4x3" each for 2 sets Lunges with Single Pole A with back knee to FP and Blue Disk 2x10  TherAct- Floor-> Plinth lift Box + 15# 5x, + 20# 2x5  TherEx- TRX DL Squat 13x (pt unable to complete 15x due to leg fatigue) Bridge on Heels 15x5"       PT Short Term Goals - 06/05/15 1010    PT SHORT TERM GOAL #1   Title pt independent with initial HEP by 06/10/15   Status Achieved           PT Long Term Goals - 07/16/15 1110    PT LONG TERM GOAL #1   Title pt scores 50/56 or better with Merrilee Jansky assessment   Status Achieved   PT LONG TERM GOAL #2   Title pt displays B ankle MMT 4/5 or greater all planes by 08/07/15   Status On-going   PT LONG TERM GOAL #3   Title pt denies limiting level of activity due to fear of falling by 08/07/15   Status On-going   PT LONG TERM GOAL #4   Title pt able to ambulate over level and uneven terrain with good mechanics without need for AD and distances not limited by fear  of falling by 08/07/15   Status Partially Met   PT LONG TERM GOAL #5   Title pt able to get up/down from ground without need for UE on furniture or any other AD by 08/07/15   Status On-going               Plan - 07/20/15  1041    Clinical Impression Statement Improved lunge mechanics and tolerance today, still requires one pole assist but not as much hesitation today. Still limited tolerance to exercises due to fatigue and LE's wanting to give out. Pt is very motivated  to return to work but question if he can tolerate full duty/full days at this point. Less exercises performed today due to pt needing more rest breaks between exercises.    PT Next Visit Plan ankle and hip strengthening, gait and balance training; work on floor level transfers and box lifting   Consulted and Agree with Plan of Care Patient        Problem List Patient Active Problem List   Diagnosis Date Noted  . Vasovagal near syncope 06/29/2015  . Carotid bruit present 04/30/2015  . Chronic systolic heart failure 73/53/2992  . CAD (coronary artery disease) 10/24/2014  . Hyperkalemia 10/24/2014  . NSTEMI (non-ST elevated myocardial infarction) 10/20/2014  . Cardiomyopathy, ischemic 10/20/2014  . Anemia due to other cause 10/06/2014  . Liver cirrhosis secondary to NASH 09/18/2014  . Diaphoresis 09/18/2014  . Abnormal finding on EKG 07/19/2014  . Ascites 07/19/2014  . Cardiomyopathy 07/19/2014  . Elevated troponin 07/19/2014  . Dehydration 07/18/2014  . Gastroenteritis 04/14/2014  . Postprandial bloating 04/14/2014  . Chronic renal insufficiency, stage II (mild) 01/12/2014  . History of pancreatitis 10/07/2013  . Diabetes mellitus type 2, uncontrolled, with complications 42/68/3419  . Cirrhosis 11/07/2011  . Peripheral neuropathy 09/12/2011  . Thrombocytopenia 07/14/2011  . HTN (hypertension), benign 06/30/2011    Barbette Hair, PTA 07/20/2015, 10:45 AM  [facility] [address]  [place], [place], [address] Phone: [phone]   Fax:  [phone]

## 2015-07-20 NOTE — Patient Instructions (Addendum)
STOP Hydralazine.   STOP Imdur.  INCREASE Lisinopril to 5mg  (1 tablet) TWICE daily.  LABS today (cbc bmet).Marland KitchenMarland KitchenREPEAT BMET IN 10DAYS.  FOLLOW UP in 1 month.

## 2015-07-21 NOTE — Progress Notes (Signed)
Patient ID: [name], male   DOB: [birth year], 54 y.o.   MRN: [id number] PCP: Dr. [name]  54 y.o. with history of CAD s/p recent PCI, cirrhosis with thrombocytopenia, cardiac arrest, and ischemic cardiomyopathy.  Patient was admitted in 7/15-8/15 with gallstone pancreatitis.  It was decided not to do a cholecystectomy due to high surgical risk.  He was sent home and went back to work.  In 10/15, he had a cardiac arrest at work and was defibrillated by AED.  He was cooled and recovered well.  Platelets were initially very low but recovered to his baseline in the 60K range.  Cardiac cath was done showing severe 2 vessel disease.  Cardiac MRI was done, showing EF 40% with a mixed viability picture.  With recovery of platelets, he had Xience DES to the LAD and the distal RCA.  He had acute on chronic systolic CHF with volume overload and was diuresed and eventually discharge.  Unfortunately, he was sent home on daily metolazone and developed AKI.  Diuretics were held and this has gradually improved.  Spironolactone and lisinopril were stopped due to hyperkalemia.  However, he was able to restart lisinopril with simultaneous use of patiromer.    He returns today for followup.  He is still doing PT for balance.  This seems to be improving.  No dyspnea walking on flat ground or up a flight of steps.  He will sometimes feel lightheaded/dizzy when lifting and carrying heavier objects.  BP has not been low (when checked at this office or at rehab). He was not orthostatic when checked today.  No orthopnea/PND. Taking Lasix regularly.  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  No chest pain, no palpitations.    Labs (11/15): K 4.7 => 7.2, creatinine 1.37 => 2.1 => 2.25 with BUN 94, HCT 26.9, plts 67, P2Y12 241 Labs (11/20/14): K 6.2, creatinine 1.1, HCT 33.2, plts 45 Labs (12/18/14): K 6.1 creatinine 1.07 off spiro and lisinopril.  Labs (1/16): K 5.2, creatinine 1.12 Labs (2/16): LDL 59, HDL 41, HCT  32.4 Labs (3/16): K 5.4, creatinine 1.27 Labs (4/16): HCT 32.9, plts 47 Labs (5/16): K 5.2, creatinine 1.23, BNP 352 Labs (6/16): hgb 11.8, plts 41  Labs (7/16): K 4.7, creatinine 1.19  PMH: 1. 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  2. Thrombocytopenia: Chronic.  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   3. Gallstone pancreatitis: He did not have cholecystectomy as deemed too high a surgical risk.  4. CKD 5. Type II diabetes with diabetic neuropathy 6. Bicuspid aortic valve: Moderate AS with mean gradient 25 mmHg by echo in 4/16.  7. H/o perforated diverticulum.  8. CAD: Cardiac arrest 10/15.  LHC (10/15) with 80-90% proximal ramus stenosis, 90% mLAD, subtotaled distal LAD, 90% D1, 90% dRCA/proximal PDA.  Patient had Xience DES to mLAD, dRCA.  9. Cardiac arrest: 10/15, defibrillated with AED and cooled.  10. Ischemic cardiomyopathy: Echo (8/15) with EF 35-40%, bicuspid aortic valve with mild AS/AI.  Cardiac MRI (10/15) with EF 40%, regional wall motion abnormalities, normal RV, delayed enhancement pattern suggesting mixed picture of viability. Echo (1/16) with EF 35-40%, hypokinesis of the mid to apical septum and the true apex, bicuspid aortic valve, mild AI.  Echo (4/16) with EF 40-45%, bicuspid aortic valve with moderate AS (mean gradient 25 mmHg) 11. Carotid dopplers (4/16): Mild plaque  SH: Never smoked.  Prior ETOH abuse, now stopped.  Works at [employer] and [employer].  Lives  with sister in [place].   FH: No premature CAD.   ROS: All systems reviewed and negative except as per HPI.   Current Outpatient Prescriptions  Medication Sig Dispense Refill  . aspirin 81 MG chewable tablet Chew 1 tablet (81 mg total) by mouth daily.    [name] atorvastatin (LIPITOR) 20 MG tablet Take 1 tablet (20 mg total) by mouth daily at 6 PM. 30 tablet 3  . carvedilol (COREG) 25 MG tablet Take 1 tablet (25 mg total) by mouth 2 (two) times daily with a meal. 60 tablet  6  . clopidogrel (PLAVIX) 75 MG tablet Take 1 tablet (75 mg total) by mouth daily. 30 tablet 3  . furosemide (LASIX) 40 MG tablet Take 1 tablet (40 mg total) by mouth daily. Hold if weight is 225 lb or less 60 tablet 6  . glucose blood (FREESTYLE TEST STRIPS) test strip Use as instructed 100 each 6  . insulin aspart (NOVOLOG) 100 UNIT/ML injection Inject 0-10 Units into the skin 3 (three) times daily before meals. Sliding scale    . Insulin Degludec (TRESIBA FLEXTOUCH) 100 UNIT/ML SOPN Inject 18 Units into the skin at bedtime. 5 pen 2  . Insulin Pen Needle 32G X 6 MM MISC Use for insulin injections 4 times per day 150 each 12  . lidocaine (LIDODERM) 5 % Place 1 patch onto the skin daily. Remove & Discard patch within 12 hours or as directed by MD (Patient taking differently: Place 1 patch onto the skin as needed. Remove & Discard patch within 12 hours or as directed by MD) 30 patch 0  . lisinopril (PRINIVIL,ZESTRIL) 5 MG tablet Take 1 tablet (5 mg total) by mouth 2 (two) times daily. 60 tablet 3  . nitroGLYCERIN (NITROSTAT) 0.4 MG SL tablet Place 1 tablet (0.4 mg total) under the tongue every 5 (five) minutes as needed for chest pain. 25 tablet 0  . patiromer (VELTASSA) 8.4 G packet Take 1 packet (8.4 g total) by mouth daily. 30 packet    No current facility-administered medications for this encounter.   BP 140/71 mmHg  Pulse 67  Resp 18  Wt 232 lb 12 oz (105.575 kg)  SpO2 97% General: NAD Neck: JVD 7, no thyromegaly or thyroid nodule. Left carotid bruit.  Lungs: Clear   CV: Nondisplaced PMI.  Heart regular S1/S2, no S3/S4, 2/6 early SEM RUSB. Trace ankle edema.  Normal pedal pulses.  Abdomen: Soft, nontender, no hepatosplenomegaly, no distention.  Skin: Intact without lesions or rashes.  Neurologic: Alert and oriented x 3.  Psych: Normal affect. Extremities: No clubbing or cyanosis.  HEENT: Normal.   Assessment/Plan: 1. Chronic systolic CHF: Echo 03/2015 with EF 40-45%, ischemic  cardiomyopathy.  NYHA class II symptoms. No volume overload on exam today. Some dizziness with lifting and carrying, not orthostatic today.   - Continue Lasix 40 mg daily and keep sodium in diet low.  - Controlling hyperkalemia with patiromer 8.4 g/day.  I have been able to add lisinopril 5 mg daily. - I will increase lisinopril to 5 mg bid.  I will also stop hydralazine/Imdur to see if this will help with dizziness.  BMET today and repeat in 10 days.  - Continue Coreg to 25 mg bid.  2. CAD: Status post Xience DES to LAD and RCA after cardiac arrest.  Will need to follow closely due to bleeding risk on DAPT with chronic thrombocytopenia.   He is currently on both ASA 81 and Plavix as P2Y12 test has  not shown robust platelet inhibition with Plavix (most recently 241 PRU).  No evidence for bleeding.  - Continue statin - Continue ASA 81 and Plavix. Stop Plavix in 10/16.  3. Cirrhosis: Likely related to ETOH, no longer drinking.  Has associated splenomegaly and chronic thrombocytopenia.  4. Thrombocytopenia: Chronic, likely due to splenomegaly. Will need to follow plts/hemoglobin over time closely.  CBC today.   5. DM: Per endocrinology.  6. Hyperkalemia: Tolerating ACEI so far with use of patiromer.   7. Carotid bruit: On left,  carotid dopplers with mild plaque. Will need follow up in 4/18.  8. Imbalance: May be related to diabetic neuropathy. Still worried about balance and doing his job.  Is going to PT for same.  Marca Ancona 07/21/2015

## 2015-07-22 ENCOUNTER — Other Ambulatory Visit (HOSPITAL_COMMUNITY): Payer: Self-pay | Admitting: *Deleted

## 2015-07-22 MED ORDER — LISINOPRIL 5 MG PO TABS: 5.0000 mg | ORAL_TABLET | Freq: Every day | ORAL | Status: DC

## 2015-07-22 MED ORDER — FUROSEMIDE 40 MG PO TABS: 20.0000 mg | ORAL_TABLET | Freq: Every day | ORAL | Status: DC

## 2015-07-23 ENCOUNTER — Ambulatory Visit: Payer: 59 | Admitting: Rehabilitation

## 2015-07-23 DIAGNOSIS — R29898 Other symptoms and signs involving the musculoskeletal system: Secondary | ICD-10-CM

## 2015-07-23 DIAGNOSIS — R269 Unspecified abnormalities of gait and mobility: Secondary | ICD-10-CM

## 2015-07-23 NOTE — Therapy (Signed)
[facility] [address]  [place], [place], [address] Phone: [phone]   Fax:  [phone]  Physical Therapy Treatment  Patient Details  Name: [name] MRN: [id number] Date of Birth: [birth year] Referring Provider:  Larey Dresser, MD  Encounter Date: 07/23/2015      PT End of Session - 07/23/15 1028    Visit Number 15   Number of Visits 19   Date for PT Re-Evaluation 08/07/15   PT Start Time 1020   PT Stop Time 1100   PT Time Calculation (min) 40 min      Past Medical History  Diagnosis Date  . Heart murmur     ECHO 07/2011 showed mild AS and LVH.  TEE 09/2011 showed small PFO, bicuspid aortic valve but no stenosis or regurg.  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  Gen surg and GI recommended flagyl and cipro x 2 wks..  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  . Cholelithiasis     u/s--no cholecystitis.  06/2014 u/s showed no stones, only GB sludge  . Peripheral neuropathy     Diabetic:  Autonomic (pelvic) and LE polyneuropathy--[facility] testing showed that it is likely from DM  . Diabetic foot ulcers     Poor healing; normal ABIs and TBIs  . Arthritis     back L3 - L5  . Hyperactive gag reflex   . History of pyelonephritis   . Type II or unspecified type diabetes mellitus with neurological manifestations, not stated as uncontrolled     IDDM: neuro, renal, and ophth complications  . Wears dentures     upper  . Osteomyelitis of toe of left foot     left 4th and 5th toes--now s/p amputation of these areas  . Diabetic retinopathy associated with type 2 diabetes mellitus     Laser tx in both eyes (Dr. Baird Cancer)  . Diabetic nephropathy     Proteinuria and CrCl 55-65  . Chronic renal insufficiency, stage III (moderate)     CrCl 50s-80s 2014  to pre-VFIB arrest 10/02/14, at which time he sustained AKI.  . Lumbar spondylosis     MRI 08/25/11 with multilevel facet dz, small disc protrusion at L5-S1 to the right without impingement  . Ischemic cardiomyopathy 07/22/14    Myoview w/out ischemia but large scar (inferior/apex) w/ global hypokinesis--medical mgmt  . History of pancreatitis 2014 and 2015    Suspected biliary: passing small gallstones: surgery declined to remove GB 07/2014.  . Aspiration pneumonia   . Thrombocytopenia   . Portal hypertension   . Liver cirrhosis secondary to NASH     ?+ alcohol?.  With hx of splenomegaly and ascities (dx approx 2012)  . History of gram negative sepsis 2015    e coli  . Ventricular fibrillation 10/02/14    Resuscitated, hospitalized, 3 V CAD detected, DES stent to mid LAD and to RCA were placed.    Past Surgical History  Procedure Laterality Date  . Knee arthroscopy  2001    Bilateral  . Amputation  10/24/2012    Procedure: AMPUTATION RAY;  Surgeon: Colin Rhein, MD;  Location: [facility];  Service: Orthopedics;  Laterality: Left;  left 4th toe amputation through MTP joint, 5th Ray amputation   . Knee surgery  2001  .  Hida scan  06/2014    Normal  . Transthoracic echocardiogram  07/19/14;01/07/15    Mild LV dilation and hypertrophy, EF 35-40%, no wall motion abnormalities, grade II diast dysfxn, bicuspid aortic valve with mild AS and mild AI.  No change on 12/2014 echo.  . Cardiovascular stress test  07/2014    Myoview w/out ischemia but large scar (inferior/apex) w/ global hypokinesis--medical mgmt  . Eye surgery  08/2014    Retinal hemorrhage evacuation  . Coronary angioplasty with stent placement  09/2014    DES to mid LAD and RCA; EF 40% by cardiac MRI  . Cardiac mri  09/2014    Moderately dilated LV.  EF 40%, multiple wall motion abnormalities, normal RV fxn.  Some areas of scarring.  . Left heart catheterization with coronary angiogram N/A 10/10/2014    Procedure: LEFT  HEART CATHETERIZATION WITH CORONARY ANGIOGRAM;  Surgeon: Peter M Martinique, MD;  Location: [facility] CATH LAB;  Service: Cardiovascular;  Laterality: N/A;  . Left heart catheterization with coronary angiogram N/A 10/15/2014    Procedure: LEFT HEART CATHETERIZATION WITH CORONARY ANGIOGRAM;  Surgeon: Wellington Hampshire, MD;  Location: [facility] CATH LAB;  Service: Cardiovascular;  Laterality: N/A;  . Carotid dopplers  04/10/15    1-35% bilat; normal subclavians    There were no vitals filed for this visit.  Visit Diagnosis:  Abnormality of gait  Ankle weakness      Subjective Assessment - 07/23/15 1025    Subjective States MD ok'd him to return to light duty and not to lift over 25#. No complaint of pain and noted fatigue after last time but nothing severe. MD also took him off of hydrozine which could be causing his lightheadedness and fatigue   Currently in Pain? No/denies      TODAY'S TREATMENT TherEx- Nustep level 6x5' UE/LE (HR up to 135) Fwd/Bwd walking in hallway with cook band 66f x2 Sideways walking in hallway with cook band [id number] each way TRX DL Squat 10x TRX SL Squat with contra knee tapping on Blue disc on 9" stool 10x each (very difficult with L LE, difficult with R but able to perform at slower pace and without tapping L toes each rep)  TherAct- Floor-> Plinth lift: Box + 20# x10 Plinth -> Counter carry -> Plinth Box + 20#    TherEx- Side-Stepping in grapevine pattern 2x20' each Wall sits with Alt Knee Ext 4x3" each for 2 sets       PT Short Term Goals - 06/05/15 1010    PT SHORT TERM GOAL #1   Title pt independent with initial HEP by 06/10/15   Status Achieved           PT Long Term Goals - 07/16/15 1110    PT LONG TERM GOAL #1   Title pt scores 50/56 or better with BeMerrilee Janskyssessment   Status Achieved   PT LONG TERM GOAL #2   Title pt displays B ankle MMT 4/5 or greater all planes by 08/07/15   Status On-going   PT LONG TERM GOAL #3   Title pt denies limiting level of  activity due to fear of falling by 08/07/15   Status On-going   PT LONG TERM GOAL #4   Title pt able to ambulate over level and uneven terrain with good mechanics without need for AD and distances not limited by fear of falling by 08/07/15   Status Partially Met   PT LONG TERM GOAL #5   Title pt able  to get up/down from ground without need for UE on furniture or any other AD by 08/07/15   Status On-going               Plan - 07/23/15 1059    Clinical Impression Statement Continues to report fatigue with walking/lifting exercises but pt able to complete with good form. No cues needed for lifting exercises today and no LOB.    PT Next Visit Plan ankle and hip strengthening, gait and balance training; work on floor level transfers and box lifting   Consulted and Agree with Plan of Care Patient        Problem List Patient Active Problem List   Diagnosis Date Noted  . Vasovagal near syncope 06/29/2015  . Carotid bruit present 04/30/2015  . Chronic systolic heart failure 81/59/4707  . CAD (coronary artery disease) 10/24/2014  . Hyperkalemia 10/24/2014  . NSTEMI (non-ST elevated myocardial infarction) 10/20/2014  . Cardiomyopathy, ischemic 10/20/2014  . Anemia due to other cause 10/06/2014  . Liver cirrhosis secondary to NASH 09/18/2014  . Diaphoresis 09/18/2014  . Abnormal finding on EKG 07/19/2014  . Ascites 07/19/2014  . Cardiomyopathy 07/19/2014  . Elevated troponin 07/19/2014  . Dehydration 07/18/2014  . Gastroenteritis 04/14/2014  . Postprandial bloating 04/14/2014  . Chronic renal insufficiency, stage II (mild) 01/12/2014  . History of pancreatitis 10/07/2013  . Diabetes mellitus type 2, uncontrolled, with complications 61/51/8343  . Cirrhosis 11/07/2011  . Peripheral neuropathy 09/12/2011  . Thrombocytopenia 07/14/2011  . HTN (hypertension), benign 06/30/2011    Barbette Hair, PTA 07/23/2015, 11:01 AM  [facility] [address]  [place], [place], [address] Phone: [phone]   Fax:  [phone]

## 2015-07-24 ENCOUNTER — Encounter (HOSPITAL_COMMUNITY): Payer: Self-pay | Admitting: *Deleted

## 2015-07-28 ENCOUNTER — Ambulatory Visit: Payer: 59 | Admitting: Physical Therapy

## 2015-07-28 ENCOUNTER — Telehealth (HOSPITAL_COMMUNITY): Payer: Self-pay | Admitting: *Deleted

## 2015-07-28 DIAGNOSIS — R269 Unspecified abnormalities of gait and mobility: Secondary | ICD-10-CM

## 2015-07-28 DIAGNOSIS — R29898 Other symptoms and signs involving the musculoskeletal system: Secondary | ICD-10-CM

## 2015-07-28 NOTE — Telephone Encounter (Signed)
Pt called in and stated a sheet from his disability paperwork is missing.  They need the sheet that states any restrictions (hours able to work, shift restrictions, how long hes able to stand, etc)  I told him i would let the nurse Aundra Millet) who handles disability paperwork know.

## 2015-07-28 NOTE — Therapy (Signed)
[facility] [address]  [place], [place], [address] Phone: [phone]   Fax:  [phone]  Physical Therapy Treatment  Patient Details  Name: [name] MRN: [id number] Date of Birth: February 08, 1961 Referring Provider:  Larey Dresser, MD  Encounter Date: 07/28/2015      PT End of Session - 07/28/15 1015    Visit Number 16   Number of Visits 19   Date for PT Re-Evaluation 08/07/15   PT Start Time 1013   PT Stop Time 1059   PT Time Calculation (min) 46 min      Past Medical History  Diagnosis Date  . Heart murmur     ECHO 07/2011 showed mild AS and LVH.  TEE 09/2011 showed small PFO, bicuspid aortic valve but no stenosis or regurg.  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  Gen surg and GI recommended flagyl and cipro x 2 wks..  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  . Cholelithiasis     u/s--no cholecystitis.  06/2014 u/s showed no stones, only GB sludge  . Peripheral neuropathy     Diabetic:  Autonomic (pelvic) and LE polyneuropathy--[facility] testing showed that it is likely from DM  . Diabetic foot ulcers     Poor healing; normal ABIs and TBIs  . Arthritis     back L3 - L5  . Hyperactive gag reflex   . History of pyelonephritis   . Type II or unspecified type diabetes mellitus with neurological manifestations, not stated as uncontrolled     IDDM: neuro, renal, and ophth complications  . Wears dentures     upper  . Osteomyelitis of toe of left foot     left 4th and 5th toes--now s/p amputation of these areas  . Diabetic retinopathy associated with type 2 diabetes mellitus     Laser tx in both eyes (Dr. Baird Cancer)  . Diabetic nephropathy     Proteinuria and CrCl 55-65  . Chronic renal insufficiency, stage III (moderate)     CrCl 50s-80s 2014  to pre-VFIB arrest 10/02/14, at which time he sustained AKI.  . Lumbar spondylosis     MRI 08/25/11 with multilevel facet dz, small disc protrusion at L5-S1 to the right without impingement  . Ischemic cardiomyopathy 07/22/14    Myoview w/out ischemia but large scar (inferior/apex) w/ global hypokinesis--medical mgmt  . History of pancreatitis 2014 and 2015    Suspected biliary: passing small gallstones: surgery declined to remove GB 07/2014.  . Aspiration pneumonia   . Thrombocytopenia   . Portal hypertension   . Liver cirrhosis secondary to NASH     ?+ alcohol?.  With hx of splenomegaly and ascities (dx approx 2012)  . History of gram negative sepsis 2015    e coli  . Ventricular fibrillation 10/02/14    Resuscitated, hospitalized, 3 V CAD detected, DES stent to mid LAD and to RCA were placed.    Past Surgical History  Procedure Laterality Date  . Knee arthroscopy  2001    Bilateral  . Amputation  10/24/2012    Procedure: AMPUTATION RAY;  Surgeon: Colin Rhein, MD;  Location: [facility];  Service: Orthopedics;  Laterality: Left;  left 4th toe amputation through MTP joint, 5th Ray amputation   . Knee surgery  2001  .  Hida scan  06/2014    Normal  . Transthoracic echocardiogram  07/19/14;01/07/15    Mild LV dilation and hypertrophy, EF 35-40%, no wall motion abnormalities, grade II diast dysfxn, bicuspid aortic valve with mild AS and mild AI.  No change on 12/2014 echo.  . Cardiovascular stress test  07/2014    Myoview w/out ischemia but large scar (inferior/apex) w/ global hypokinesis--medical mgmt  . Eye surgery  08/2014    Retinal hemorrhage evacuation  . Coronary angioplasty with stent placement  09/2014    DES to mid LAD and RCA; EF 40% by cardiac MRI  . Cardiac mri  09/2014    Moderately dilated LV.  EF 40%, multiple wall motion abnormalities, normal RV fxn.  Some areas of scarring.  . Left heart catheterization with coronary angiogram N/A 10/10/2014    Procedure: LEFT  HEART CATHETERIZATION WITH CORONARY ANGIOGRAM;  Surgeon: Peter M Martinique, MD;  Location[facility] CATH LAB;  Service: Cardiovascular;  Laterality: N/A;  . Left heart catheterization with coronary angiogram N/A 10/15/2014    Procedure: LEFT HEART CATHETERIZATION WITH CORONARY ANGIOGRAM;  Surgeon: Wellington Hampshire, MD;  Location: [facility] CATH LAB;  Service: Cardiovascular;  Laterality: N/A;  . Carotid dopplers  04/10/15    1-35% bilat; normal subclavians    There were no vitals filed for this visit.  Visit Diagnosis:  Abnormality of gait  Ankle weakness      Subjective Assessment - 07/28/15 1019    Subjective Has been busy around the house lately.  States balance is improving. States hasn't stumbled lately and state L ankle does not roll while he's wearing ankle brace.  He notes fatigue and weakness in L LE.  States is able to get up/down from ground easier but still requires some UE assistance.  Pt has been released by MD to return to work light duty but pt hasn't got okay from employer yet for return.   Currently in Pain? No/denies        TODAY'S TREATMENT TherEx - Nustep level 7x5' UE/LE TRX DL Squat 15x TRX Lunge with contra knee tapping on Blue disc on 6" step 10x each (good tapping with both knees but more fatigue on L when tapping R) TRX Single Hand Lunge with contra knee tapping on Blue disc on 6" step 5x each (able B but very difficult lowering R knee) TRX Single Hand Lunge with contra knee tapping on Blue disc on 4" step 5x each (able B but very difficult lowering R knee) TRX DL Squat on BOSU (down) 15x TRX Narrow DL Squat on BOSU (down) 15x Side-Stepping Blue TB at knees 2x35' each  TherAct - Floor-> Counter lift: Box + 10# 15x (very fatigued B thighs (L worse), HR up to 107ish) Single Hand Carry (17.5#) 85' each hand.  No fatigue or pain.,                     PT Short Term Goals - 06/05/15 1010    PT SHORT TERM GOAL #1   Title pt independent with initial HEP by  06/10/15   Status Achieved           PT Long Term Goals - 07/16/15 1110    PT LONG TERM GOAL #1   Title pt scores 50/56 or better with Merrilee Jansky assessment   Status Achieved   PT LONG TERM GOAL #2   Title pt displays B ankle MMT 4/5 or greater all planes by 08/07/15   Status On-going  PT LONG TERM GOAL #3   Title pt denies limiting level of activity due to fear of falling by 08/07/15   Status On-going   PT LONG TERM GOAL #4   Title pt able to ambulate over level and uneven terrain with good mechanics without need for AD and distances not limited by fear of falling by 08/07/15   Status Partially Met   PT LONG TERM GOAL #5   Title pt able to get up/down from ground without need for UE on furniture or any other AD by 08/07/15   Status On-going               Plan - 07/28/15 1101    Clinical Impression Statement pt is improving in ability to get up/down from floor but still unable without UE pulling on something.  Pt may be returning to light duty at work soon.  Unlikely that he'll be able to tolerate 8 hour shift of physical activity as his job had required in the past even if with 25# lifting restriction.  Pt fatigues with 5 minutes of continuous activity in the clinic requiring standing and sometimes even seated breaks.   PT Next Visit Plan ankle and hip strengthening, gait and balance training; work on floor level transfers and box lifting   Consulted and Agree with Plan of Care Patient        Problem List Patient Active Problem List   Diagnosis Date Noted  . Vasovagal near syncope 06/29/2015  . Carotid bruit present 04/30/2015  . Chronic systolic heart failure 33/11/5086  . CAD (coronary artery disease) 10/24/2014  . Hyperkalemia 10/24/2014  . NSTEMI (non-ST elevated myocardial infarction) 10/20/2014  . Cardiomyopathy, ischemic 10/20/2014  . Anemia due to other cause 10/06/2014  . Liver cirrhosis secondary to NASH 09/18/2014  . Diaphoresis 09/18/2014  . Abnormal finding  on EKG 07/19/2014  . Ascites 07/19/2014  . Cardiomyopathy 07/19/2014  . Elevated troponin 07/19/2014  . Dehydration 07/18/2014  . Gastroenteritis 04/14/2014  . Postprandial bloating 04/14/2014  . Chronic renal insufficiency, stage II (mild) 01/12/2014  . History of pancreatitis 10/07/2013  . Diabetes mellitus type 2, uncontrolled, with complications 19/94/1290  . Cirrhosis 11/07/2011  . Peripheral neuropathy 09/12/2011  . Thrombocytopenia 07/14/2011  . HTN (hypertension), benign 06/30/2011    Dalene Robards PT, OCS 07/28/2015, 11:03 AM  [facility] [address]  [place], [place], [address] Phone: [phone]   Fax:  [phone]

## 2015-07-29 ENCOUNTER — Ambulatory Visit (HOSPITAL_COMMUNITY)
Admission: RE | Admit: 2015-07-29 | Discharge: 2015-07-29 | Disposition: A | Discharge status: Home / Self Care | Payer: 59 | Source: Ambulatory Visit | Attending: Internal Medicine | Admitting: Internal Medicine

## 2015-07-29 ENCOUNTER — Encounter (HOSPITAL_COMMUNITY): Payer: Self-pay

## 2015-07-29 DIAGNOSIS — I5022 Chronic systolic (congestive) heart failure: Secondary | ICD-10-CM | POA: Diagnosis not present

## 2015-07-29 LAB — BASIC METABOLIC PANEL
ANION GAP: 3 — AB (ref 5–15)
BUN: 28 mg/dL — ABNORMAL HIGH (ref 6–20)
CO2: 25 mmol/L (ref 22–32)
Calcium: 8.4 mg/dL — ABNORMAL LOW (ref 8.9–10.3)
Chloride: 108 mmol/L (ref 101–111)
Creatinine, Ser: 1.28 mg/dL — ABNORMAL HIGH (ref 0.61–1.24)
GFR calc Af Amer: >60 mL/min (ref >60)
GFR calc non Af Amer: >60 mL/min (ref >60)
Glucose, Bld: 137 mg/dL — ABNORMAL HIGH (ref 65–99)
POTASSIUM: 5 mmol/L (ref 3.5–5.1)
SODIUM: 136 mmol/L (ref 135–145)

## 2015-07-29 NOTE — Progress Notes (Signed)
Form faxed by [employer] completed and return-faxed with all requested records (progress notes, radiology reports, ekgs, lab results 01/2015-current).  Copy of form scanned into electronic medical records.  Ave Filter

## 2015-07-30 ENCOUNTER — Ambulatory Visit: Payer: 59 | Admitting: Physical Therapy

## 2015-07-30 DIAGNOSIS — R29898 Other symptoms and signs involving the musculoskeletal system: Secondary | ICD-10-CM

## 2015-07-30 DIAGNOSIS — R269 Unspecified abnormalities of gait and mobility: Secondary | ICD-10-CM | POA: Diagnosis not present

## 2015-07-30 NOTE — Therapy (Signed)
[facility] [address]  [address] [place], [place], [address] Phone: [phone]   Fax:  [phone]  Physical Therapy Treatment  Patient Details  Name: [name] MRN: [id number] Date of Birth: 1961-02-12 Referring Provider:  Laurey Morale, MD  Encounter Date: 07/30/2015      PT End of Session - 07/30/15 1022    Visit Number 17   Number of Visits 19   Date for PT Re-Evaluation 08/07/15   PT Start Time 1020   PT Stop Time 1058   PT Time Calculation (min) 38 min      Past Medical History  Diagnosis Date  . Heart murmur     ECHO 07/2011 showed mild AS and LVH.  TEE 09/2011 showed small PFO, bicuspid aortic valve but no stenosis or regurg.  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  Gen surg and GI recommended flagyl and cipro x 2 wks..  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  . Cholelithiasis     u/s--no cholecystitis.  06/2014 u/s showed no stones, only GB sludge  . Peripheral neuropathy     Diabetic:  Autonomic (pelvic) and LE polyneuropathy--[facility] testing showed that it is likely from DM  . Diabetic foot ulcers     Poor healing; normal ABIs and TBIs  . Arthritis     back L3 - L5  . Hyperactive gag reflex   . History of pyelonephritis   . Type II or unspecified type diabetes mellitus with neurological manifestations, not stated as uncontrolled     IDDM: neuro, renal, and ophth complications  . Wears dentures     upper  . Osteomyelitis of toe of left foot     left 4th and 5th toes--now s/p amputation of these areas  . Diabetic retinopathy associated with type 2 diabetes mellitus     Laser tx in both eyes (Dr. Allyne Gee)  . Diabetic nephropathy     Proteinuria and CrCl 55-65  . Chronic renal insufficiency, stage III (moderate)     CrCl 50s-80s  2014 to pre-VFIB arrest 10/02/14, at which time he sustained AKI.  . Lumbar spondylosis     MRI 08/25/11 with multilevel facet dz, small disc protrusion at L5-S1 to the right without impingement  . Ischemic cardiomyopathy 07/22/14    Myoview w/out ischemia but large scar (inferior/apex) w/ global hypokinesis--medical mgmt  . History of pancreatitis 2014 and 2015    Suspected biliary: passing small gallstones: surgery declined to remove GB 07/2014.  . Aspiration pneumonia   . Thrombocytopenia   . Portal hypertension   . Liver cirrhosis secondary to NASH     ?+ alcohol?.  With hx of splenomegaly and ascities (dx approx 2012)  . History of gram negative sepsis 2015    e coli  . Ventricular fibrillation 10/02/14    Resuscitated, hospitalized, 3 V CAD detected, DES stent to mid LAD and to RCA were placed.    Past Surgical History  Procedure Laterality Date  . Knee arthroscopy  2001    Bilateral  . Amputation  10/24/2012    Procedure: AMPUTATION RAY;  Surgeon: Sherri Rad, MD;  Location: [facility];  Service: Orthopedics;  Laterality: Left;  left 4th toe amputation through MTP joint, 5th Ray amputation   . Knee surgery  2001  .  Hida scan  06/2014    Normal  . Transthoracic echocardiogram  07/19/14;01/07/15    Mild LV dilation and hypertrophy, EF 35-40%, no wall motion abnormalities, grade II diast dysfxn, bicuspid aortic valve with mild AS and mild AI.  No change on 12/2014 echo.  . Cardiovascular stress test  07/2014    Myoview w/out ischemia but large scar (inferior/apex) w/ global hypokinesis--medical mgmt  . Eye surgery  08/2014    Retinal hemorrhage evacuation  . Coronary angioplasty with stent placement  09/2014    DES to mid LAD and RCA; EF 40% by cardiac MRI  . Cardiac mri  09/2014    Moderately dilated LV.  EF 40%, multiple wall motion abnormalities, normal RV fxn.  Some areas of scarring.  . Left heart catheterization with coronary angiogram N/A 10/10/2014    Procedure:  LEFT HEART CATHETERIZATION WITH CORONARY ANGIOGRAM;  Surgeon: Peter M Swaziland, MD;  Location: [facility] CATH LAB;  Service: Cardiovascular;  Laterality: N/A;  . Left heart catheterization with coronary angiogram N/A 10/15/2014    Procedure: LEFT HEART CATHETERIZATION WITH CORONARY ANGIOGRAM;  Surgeon: Iran Ouch, MD;  Location: [facility] CATH LAB;  Service: Cardiovascular;  Laterality: N/A;  . Carotid dopplers  04/10/15    1-35% bilat; normal subclavians    There were no vitals filed for this visit.  Visit Diagnosis:  Abnormality of gait  Ankle weakness      Subjective Assessment - 07/30/15 1021    Subjective pt states his work will not have pt return to work until they recieve discharge summary from PT next week.   Currently in Pain? No/denies        TODAY'S TREATMENT TherEx - SL Bridge 10x each TRX SL Squat with contra hip Flexion 10x each TRX Single Hand Lunge with contra knee tapping on Blue disc on 4" step 5x each (able B but difficult with L) TRX Single Hand Lunge with contra knee tapping on Blue disc on 2" step 5x with R, 3x with L (able B but very difficult with L) TRX Single Hand Lunge with contra knee tapping on Blue disc on Floor 4x R (good performance, 4th rep very difficult due to fatigue) Wall Sit with B Bicep Curls 5# 3x10  TherAct - Floor-> Counter lift: Box + 10# 20x (5'30"; notes B buttock fatigue)           PT Short Term Goals - 06/05/15 1010    PT SHORT TERM GOAL #1   Title pt independent with initial HEP by 06/10/15   Status Achieved           PT Long Term Goals - 07/30/15 1059    PT LONG TERM GOAL #2   Title pt displays B ankle MMT 4/5 or greater all planes by 08/07/15   Status On-going   PT LONG TERM GOAL #5   Title pt able to get up/down from ground without need for UE on furniture or any other AD by 08/07/15   Status On-going               Problem List Patient Active Problem List   Diagnosis Date Noted  . Vasovagal near syncope  06/29/2015  . Carotid bruit present 04/30/2015  . Chronic systolic heart failure 11/03/2014  . CAD (coronary artery disease) 10/24/2014  . Hyperkalemia 10/24/2014  . NSTEMI (non-ST elevated myocardial infarction) 10/20/2014  . Cardiomyopathy, ischemic 10/20/2014  . Anemia due to other cause 10/06/2014  . Liver cirrhosis secondary to NASH 09/18/2014  .  Diaphoresis 09/18/2014  . Abnormal finding on EKG 07/19/2014  . Ascites 07/19/2014  . Cardiomyopathy 07/19/2014  . Elevated troponin 07/19/2014  . Dehydration 07/18/2014  . Gastroenteritis 04/14/2014  . Postprandial bloating 04/14/2014  . Chronic renal insufficiency, stage II (mild) 01/12/2014  . History of pancreatitis 10/07/2013  . Diabetes mellitus type 2, uncontrolled, with complications 01/07/2012  . Cirrhosis 11/07/2011  . Peripheral neuropathy 09/12/2011  . Thrombocytopenia 07/14/2011  . HTN (hypertension), benign 06/30/2011    Damarie Schoolfield PT, OCS 07/30/2015, 11:02 AM  [facility] [address]  [address] [place], [place], [address] Phone: [phone]   Fax:  [phone]

## 2015-08-04 ENCOUNTER — Encounter (HOSPITAL_COMMUNITY): Payer: Self-pay

## 2015-08-04 NOTE — Progress Notes (Signed)
[employer] faxed return to work form and medical record request for records 07/20/2015 - present from our office at [facility]. Form completed and signed by Dr. Marca Ancona. Records requested and form return faxed to [phone]. Copy of form and request scanned into electronic medical records.  Ave Filter

## 2015-08-06 ENCOUNTER — Ambulatory Visit: Payer: 59 | Admitting: Rehabilitation

## 2015-08-06 DIAGNOSIS — R29898 Other symptoms and signs involving the musculoskeletal system: Secondary | ICD-10-CM

## 2015-08-06 DIAGNOSIS — R269 Unspecified abnormalities of gait and mobility: Secondary | ICD-10-CM

## 2015-08-06 NOTE — Therapy (Signed)
[facility] [address]  [place], [place], [address] Phone: [phone]   Fax:  [phone]  Physical Therapy Treatment  Patient Details  Name: [name] MRN: [id number] Date of Birth: 02-11-1961 Referring Provider:  Larey Dresser, MD  Encounter Date: 08/06/2015      PT End of Session - 08/06/15 1608    Visit Number 18   Number of Visits 19   Date for PT Re-Evaluation 08/07/15   PT Start Time 2353   PT Stop Time 6144   PT Time Calculation (min) 45 min      Past Medical History  Diagnosis Date  . Heart murmur     ECHO 07/2011 showed mild AS and LVH.  TEE 09/2011 showed small PFO, bicuspid aortic valve but no stenosis or regurg.  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  Gen surg and GI recommended flagyl and cipro x 2 wks..  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  . Cholelithiasis     u/s--no cholecystitis.  06/2014 u/s showed no stones, only GB sludge  . Peripheral neuropathy     Diabetic:  Autonomic (pelvic) and LE polyneuropathy--[facility] testing showed that it is likely from DM  . Diabetic foot ulcers     Poor healing; normal ABIs and TBIs  . Arthritis     back L3 - L5  . Hyperactive gag reflex   . History of pyelonephritis   . Type II or unspecified type diabetes mellitus with neurological manifestations, not stated as uncontrolled     IDDM: neuro, renal, and ophth complications  . Wears dentures     upper  . Osteomyelitis of toe of left foot     left 4th and 5th toes--now s/p amputation of these areas  . Diabetic retinopathy associated with type 2 diabetes mellitus     Laser tx in both eyes (Dr. Baird Cancer)  . Diabetic nephropathy     Proteinuria and CrCl 55-65  . Chronic renal insufficiency, stage III (moderate)     CrCl 50s-80s  2014 to pre-VFIB arrest 10/02/14, at which time he sustained AKI.  . Lumbar spondylosis     MRI 08/25/11 with multilevel facet dz, small disc protrusion at L5-S1 to the right without impingement  . Ischemic cardiomyopathy 07/22/14    Myoview w/out ischemia but large scar (inferior/apex) w/ global hypokinesis--medical mgmt  . History of pancreatitis 2014 and 2015    Suspected biliary: passing small gallstones: surgery declined to remove GB 07/2014.  . Aspiration pneumonia   . Thrombocytopenia   . Portal hypertension   . Liver cirrhosis secondary to NASH     ?+ alcohol?.  With hx of splenomegaly and ascities (dx approx 2012)  . History of gram negative sepsis 2015    e coli  . Ventricular fibrillation 10/02/14    Resuscitated, hospitalized, 3 V CAD detected, DES stent to mid LAD and to RCA were placed.    Past Surgical History  Procedure Laterality Date  . Knee arthroscopy  2001    Bilateral  . Amputation  10/24/2012    Procedure: AMPUTATION RAY;  Surgeon: Colin Rhein, MD;  Location: [facility];  Service: Orthopedics;  Laterality: Left;  left 4th toe amputation through MTP joint, 5th Ray amputation   . Knee surgery  2001  .  Hida scan  06/2014    Normal  . Transthoracic echocardiogram  07/19/14;01/07/15    Mild LV dilation and hypertrophy, EF 35-40%, no wall motion abnormalities, grade II diast dysfxn, bicuspid aortic valve with mild AS and mild AI.  No change on 12/2014 echo.  . Cardiovascular stress test  07/2014    Myoview w/out ischemia but large scar (inferior/apex) w/ global hypokinesis--medical mgmt  . Eye surgery  08/2014    Retinal hemorrhage evacuation  . Coronary angioplasty with stent placement  09/2014    DES to mid LAD and RCA; EF 40% by cardiac MRI  . Cardiac mri  09/2014    Moderately dilated LV.  EF 40%, multiple wall motion abnormalities, normal RV fxn.  Some areas of scarring.  . Left heart catheterization with coronary angiogram N/A 10/10/2014    Procedure:  LEFT HEART CATHETERIZATION WITH CORONARY ANGIOGRAM;  Surgeon: Peter M Martinique, MD;  Location: [facility] CATH LAB;  Service: Cardiovascular;  Laterality: N/A;  . Left heart catheterization with coronary angiogram N/A 10/15/2014    Procedure: LEFT HEART CATHETERIZATION WITH CORONARY ANGIOGRAM;  Surgeon: Wellington Hampshire, MD;  Location: [facility] CATH LAB;  Service: Cardiovascular;  Laterality: N/A;  . Carotid dopplers  04/10/15    1-35% bilat; normal subclavians    There were no vitals filed for this visit.  Visit Diagnosis:  Abnormality of gait  Ankle weakness          [facility] PT Assessment - 08/06/15 0001    Strength   Right Hip Flexion 4+/5   Right Hip Extension 5/5   Right Hip ABduction 4+/5   Right Hip ADduction 5/5   Left Hip Flexion 4+/5   Left Hip Extension --  5-/5   Left Hip ABduction 4+/5   Left Hip ADduction 4+/5   Right Ankle Dorsiflexion 4/5   Right Ankle Plantar Flexion 3/5   Right Ankle Inversion 4+/5   Right Ankle Eversion 4/5   Left Ankle Dorsiflexion 4/5   Left Ankle Plantar Flexion 3/5   Left Ankle Inversion 3+/5   Left Ankle Eversion 3/5   Berg Balance Test   Sit to Stand Able to stand without using hands and stabilize independently   Standing Unsupported Able to stand safely 2 minutes   Sitting with Back Unsupported but Feet Supported on Floor or Stool Able to sit safely and securely 2 minutes   Stand to Sit Sits safely with minimal use of hands   Transfers Able to transfer safely, minor use of hands   Standing Unsupported with Eyes Closed Able to stand 10 seconds safely   Standing Ubsupported with Feet Together Able to place feet together independently and stand 1 minute safely   From Standing, Reach Forward with Outstretched Arm Can reach confidently >25 cm (10")   From Standing Position, Pick up Object from Floor Able to pick up shoe safely and easily   From Standing Position, Turn to Look Behind Over each Shoulder Looks behind one side only/other side shows less  weight shift   Turn 360 Degrees Able to turn 360 degrees safely in 4 seconds or less   Standing Unsupported, Alternately Place Feet on Step/Stool Able to stand independently and safely and complete 8 steps in 20 seconds   Standing Unsupported, One Foot in Front Able to plae foot ahead of the other independently and hold 30 seconds   Standing on One Leg Tries to lift leg/unable to hold 3 seconds but remains standing independently   Total Score 51  High Level Balance   High Level Balance Comments SLS: Pt able to hold Rt LE 3-10", Lt LE 3-5" (each leg with 5 attempts      TODAY'S TREATMENT TherEx -  MMT/Berg/Getting up/down from floor/Goal Check   TherAct- Getting down/up from floor x5 with HHA on chair Floor-> Counter lift: Box + 10# 20x (3'14"; notes B buttock fatigue)  TherEx TRX Single Hand Lunge with contra knee tapping on Blue disc on Floor 2x5 Bil (able to perform but required Lt UE on Lt knee when Lt LE forward)   Side-Stepping Blue TB at knees 2x35' each while holding blue medicine ball (HR 107-110) Monster Walking with Blue TB at knees 2x35' fwd only      PT Short Term Goals - 08/06/15 1620    PT SHORT TERM GOAL #1   Title pt independent with initial HEP by 06/10/15   Status Achieved           PT Long Term Goals - 08/06/15 1620    PT LONG TERM GOAL #1   Title pt scores 50/56 or better with Merrilee Jansky assessment   Status Achieved   PT LONG TERM GOAL #2   Title pt displays B ankle MMT 4/5 or greater all planes by 08/07/15  Lacking Lt ankle PF, IV, EV and Rt ankle PF   Status Partially Met   PT LONG TERM GOAL #3   Title pt denies limiting level of activity due to fear of falling by 08/07/15  Continues to limit himself with activities due to fear unless someone else is there (ladder climbing)   Status Not Met   PT LONG TERM GOAL #4   Title pt able to ambulate over level and uneven terrain with good mechanics without need for AD and distances not limited by fear of falling  by 08/07/15   Status Partially Met   PT LONG TERM GOAL #5   Title pt able to get up/down from ground without need for UE on furniture or any other AD by 08/07/15  Continues to require assist to get up from the floor but able to lower independently   Status Partially Met               Plan - 08/06/15 1654    Clinical Impression Statement Pt continues to show improvements with funtional activities but also fatigues quickling requiring rest breaks. Agree with head PT's assessment that it is highly doubtful pt can tolerate a full 8 hour shift even with lifting restriction. Noted some improvements with hip strength but ankle strength continues to be limited. Pt's balance/function seems to be improved with the added stability of the ankle brace.    PT Next Visit Plan Continue 1 more appointment to finish POC   Consulted and Agree with Plan of Care Patient        Problem List Patient Active Problem List   Diagnosis Date Noted  . Vasovagal near syncope 06/29/2015  . Carotid bruit present 04/30/2015  . Chronic systolic heart failure 53/97/6734  . CAD (coronary artery disease) 10/24/2014  . Hyperkalemia 10/24/2014  . NSTEMI (non-ST elevated myocardial infarction) 10/20/2014  . Cardiomyopathy, ischemic 10/20/2014  . Anemia due to other cause 10/06/2014  . Liver cirrhosis secondary to NASH 09/18/2014  . Diaphoresis 09/18/2014  . Abnormal finding on EKG 07/19/2014  . Ascites 07/19/2014  . Cardiomyopathy 07/19/2014  . Elevated troponin 07/19/2014  . Dehydration 07/18/2014  . Gastroenteritis 04/14/2014  . Postprandial bloating 04/14/2014  . Chronic renal  insufficiency, stage II (mild) 01/12/2014  . History of pancreatitis 10/07/2013  . Diabetes mellitus type 2, uncontrolled, with complications 64/31/4276  . Cirrhosis 11/07/2011  . Peripheral neuropathy 09/12/2011  . Thrombocytopenia 07/14/2011  . HTN (hypertension), benign 06/30/2011    Barbette Hair, PTA 08/06/2015, 5:49  PM  [facility] [address]  [place], [place], [address] Phone: [phone]   Fax:  [phone]

## 2015-08-10 ENCOUNTER — Telehealth (HOSPITAL_COMMUNITY): Payer: Self-pay | Admitting: *Deleted

## 2015-08-10 NOTE — Telephone Encounter (Signed)
Pt called with questions/concerns about his disability forms that we sent in last week, will send to [facility] to address as she handles disability forms

## 2015-08-10 NOTE — Telephone Encounter (Signed)
Asking for restrictions on disability papers. Added "rest breaks 15 minutes as needed, sitting/walking 1 hr max at a time, stairs as tolerated" to paperwork, refaxed. Patient will call our clinic back with contact info to forward PT notes to RN at his job.  Ave Filter

## 2015-08-11 ENCOUNTER — Telehealth (HOSPITAL_COMMUNITY): Payer: Self-pay | Admitting: *Deleted

## 2015-08-11 NOTE — Telephone Encounter (Signed)
Patient called to ask [place] to please send his PT and his restrictions to the RN at his work.  RN name is Cheryl-Fax#[phone] This was done 08/11/15 @ 1424

## 2015-08-12 ENCOUNTER — Ambulatory Visit: Payer: 59 | Admitting: Rehabilitation

## 2015-08-12 DIAGNOSIS — R269 Unspecified abnormalities of gait and mobility: Secondary | ICD-10-CM

## 2015-08-12 DIAGNOSIS — R29898 Other symptoms and signs involving the musculoskeletal system: Secondary | ICD-10-CM

## 2015-08-12 NOTE — Therapy (Addendum)
[facility] [address]  [place], [place], [address] Phone: [phone]   Fax:  [phone]  Physical Therapy Treatment  Patient Details  Name: [name] MRN: [id number] Date of Birth: 1961/05/08 Referring Provider:  Larey Dresser, MD  Encounter Date: 08/12/2015      PT End of Session - 08/12/15 1357    Visit Number 19   Number of Visits 19   Date for PT Re-Evaluation 08/07/15   PT Start Time 4259   PT Stop Time 1435   PT Time Calculation (min) 40 min      Past Medical History  Diagnosis Date  . Heart murmur     ECHO 07/2011 showed mild AS and LVH.  TEE 09/2011 showed small PFO, bicuspid aortic valve but no stenosis or regurg.  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  Gen surg and GI recommended flagyl and cipro x 2 wks..  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  . Cholelithiasis     u/s--no cholecystitis.  06/2014 u/s showed no stones, only GB sludge  . Peripheral neuropathy     Diabetic:  Autonomic (pelvic) and LE polyneuropathy--WFUB testing showed that it is likely from DM  . Diabetic foot ulcers     Poor healing; normal ABIs and TBIs  . Arthritis     back L3 - L5  . Hyperactive gag reflex   . History of pyelonephritis   . Type II or unspecified type diabetes mellitus with neurological manifestations, not stated as uncontrolled     IDDM: neuro, renal, and ophth complications  . Wears dentures     upper  . Osteomyelitis of toe of left foot     left 4th and 5th toes--now s/p amputation of these areas  . Diabetic retinopathy associated with type 2 diabetes mellitus     Laser tx in both eyes (Dr. Baird Cancer)  . Diabetic nephropathy     Proteinuria and CrCl 55-65  . Chronic renal insufficiency, stage III (moderate)     CrCl 50s-80s  2014 to pre-VFIB arrest 10/02/14, at which time he sustained AKI.  . Lumbar spondylosis     MRI 08/25/11 with multilevel facet dz, small disc protrusion at L5-S1 to the right without impingement  . Ischemic cardiomyopathy 07/22/14    Myoview w/out ischemia but large scar (inferior/apex) w/ global hypokinesis--medical mgmt  . History of pancreatitis 2014 and 2015    Suspected biliary: passing small gallstones: surgery declined to remove GB 07/2014.  . Aspiration pneumonia   . Thrombocytopenia   . Portal hypertension   . Liver cirrhosis secondary to NASH     ?+ alcohol?.  With hx of splenomegaly and ascities (dx approx 2012)  . History of gram negative sepsis 2015    e coli  . Ventricular fibrillation 10/02/14    Resuscitated, hospitalized, 3 V CAD detected, DES stent to mid LAD and to RCA were placed.    Past Surgical History  Procedure Laterality Date  . Knee arthroscopy  2001    Bilateral  . Amputation  10/24/2012    Procedure: AMPUTATION RAY;  Surgeon: Colin Rhein, MD;  Location: [facility];  Service: Orthopedics;  Laterality: Left;  left 4th toe amputation through MTP joint, 5th Ray amputation   . Knee surgery  2001  .  Hida scan  06/2014    Normal  . Transthoracic echocardiogram  07/19/14;01/07/15    Mild LV dilation and hypertrophy, EF 35-40%, no wall motion abnormalities, grade II diast dysfxn, bicuspid aortic valve with mild AS and mild AI.  No change on 12/2014 echo.  . Cardiovascular stress test  07/2014    Myoview w/out ischemia but large scar (inferior/apex) w/ global hypokinesis--medical mgmt  . Eye surgery  08/2014    Retinal hemorrhage evacuation  . Coronary angioplasty with stent placement  09/2014    DES to mid LAD and RCA; EF 40% by cardiac MRI  . Cardiac mri  09/2014    Moderately dilated LV.  EF 40%, multiple wall motion abnormalities, normal RV fxn.  Some areas of scarring.  . Left heart catheterization with coronary angiogram N/A 10/10/2014    Procedure:  LEFT HEART CATHETERIZATION WITH CORONARY ANGIOGRAM;  Surgeon: Peter M Martinique, MD;  Location: [facility] CATH LAB;  Service: Cardiovascular;  Laterality: N/A;  . Left heart catheterization with coronary angiogram N/A 10/15/2014    Procedure: LEFT HEART CATHETERIZATION WITH CORONARY ANGIOGRAM;  Surgeon: Wellington Hampshire, MD;  Location: [facility] CATH LAB;  Service: Cardiovascular;  Laterality: N/A;  . Carotid dopplers  04/10/15    1-35% bilat; normal subclavians    There were no vitals filed for this visit.  Visit Diagnosis:  Abnormality of gait  Ankle weakness      Subjective Assessment - 08/12/15 1357    Subjective Reports frustration with miscommunication between his job and his doctors about being released to return to work.     Currently in Pain? No/denies      TODAY'S TREATMENT TherEx -  Nustep level 6x5' (HR 113)  TherAct- Getting down/up from floor x5 with light HHA on chair Floor-> Counter lift: Box + 10# 2x20 (HR 105, 107)  TherEx- Lunges with B shoulder Abduction 4# 2x5 TRX Single Hand Lunge with contra knee tapping on Blue disc on Floor 2x5 Bil (able to perform but required Lt UE on Lt knee when Lt LE forward)  Monster Walking with Blue TB at knees 2x35' fwd only (105)         PT Short Term Goals - 08/06/15 1620    PT SHORT TERM GOAL #1   Title pt independent with initial HEP by 06/10/15   Status Achieved           PT Long Term Goals - 08/06/15 1620    PT LONG TERM GOAL #1   Title pt scores 50/56 or better with Merrilee Jansky assessment   Status Achieved   PT LONG TERM GOAL #2   Title pt displays B ankle MMT 4/5 or greater all planes by 08/07/15  Lacking Lt ankle PF, IV, EV and Rt ankle PF   Status Partially Met   PT LONG TERM GOAL #3   Title pt denies limiting level of activity due to fear of falling by 08/07/15  Continues to limit himself with activities due to fear unless someone else is there (ladder climbing)   Status Not Met   PT LONG TERM GOAL #4   Title pt able to  ambulate over level and uneven terrain with good mechanics without need for AD and distances not limited by fear of falling by 08/07/15   Status Partially Met   PT LONG TERM GOAL #5   Title pt able to get up/down from ground without need for UE on furniture or any other AD by 08/07/15  Continues to require  assist to get up from the floor but able to lower independently   Status Partially Met               Plan - 08/12/15 1425    Clinical Impression Statement HR continues to stay around 107-113 with exertion but not increasing above that. Pt continues to fatigue quickling with work related tasks requiring rest breaks. Pt does seem motivated to return to work and should do well with restrictions. Pt reported LE fatigued at the end of RX.    PT Next Visit Plan D/C as POC is expired and pt to continue exercising at gym to progress himself.    Consulted and Agree with Plan of Care Patient        Problem List Patient Active Problem List   Diagnosis Date Noted  . Vasovagal near syncope 06/29/2015  . Carotid bruit present 04/30/2015  . Chronic systolic heart failure 33/82/5053  . CAD (coronary artery disease) 10/24/2014  . Hyperkalemia 10/24/2014  . NSTEMI (non-ST elevated myocardial infarction) 10/20/2014  . Cardiomyopathy, ischemic 10/20/2014  . Anemia due to other cause 10/06/2014  . Liver cirrhosis secondary to NASH 09/18/2014  . Diaphoresis 09/18/2014  . Abnormal finding on EKG 07/19/2014  . Ascites 07/19/2014  . Cardiomyopathy 07/19/2014  . Elevated troponin 07/19/2014  . Dehydration 07/18/2014  . Gastroenteritis 04/14/2014  . Postprandial bloating 04/14/2014  . Chronic renal insufficiency, stage II (mild) 01/12/2014  . History of pancreatitis 10/07/2013  . Diabetes mellitus type 2, uncontrolled, with complications 97/67/3419  . Cirrhosis 11/07/2011  . Peripheral neuropathy 09/12/2011  . Thrombocytopenia 07/14/2011  . HTN (hypertension), benign 06/30/2011    Barbette Hair, PTA 08/12/2015, 2:35 PM  [facility] [address]  [place], [place], [address] Phone: [phone]   Fax:  [phone]     PHYSICAL THERAPY DISCHARGE SUMMARY  Visits from Start of Care: 19 Current functional level related to goals / functional outcomes: Goals partially met, pt has returned to work   Remaining deficits: B Ankle weakness, impaired functional mobility   Education / Equipment: HEP Plan: Patient agrees to discharge.  Patient goals were partially met. Patient is being discharged due to                                                     Pt returning to work        [employer] PT, OCS 09/22/2015 1:46 PM

## 2015-08-20 ENCOUNTER — Encounter (HOSPITAL_COMMUNITY): Payer: Self-pay | Admitting: Cardiology

## 2015-08-20 ENCOUNTER — Ambulatory Visit (HOSPITAL_COMMUNITY)
Admission: RE | Admit: 2015-08-20 | Discharge: 2015-08-20 | Disposition: A | Discharge status: Home / Self Care | Payer: 59 | Source: Ambulatory Visit | Attending: Internal Medicine | Admitting: Internal Medicine

## 2015-08-20 VITALS — BP 124/68 | HR 63 | Wt 235.5 lb

## 2015-08-20 DIAGNOSIS — K7460 Unspecified cirrhosis of liver: Secondary | ICD-10-CM | POA: Insufficient documentation

## 2015-08-20 DIAGNOSIS — Z7982 Long term (current) use of aspirin: Secondary | ICD-10-CM | POA: Diagnosis not present

## 2015-08-20 DIAGNOSIS — I255 Ischemic cardiomyopathy: Secondary | ICD-10-CM | POA: Insufficient documentation

## 2015-08-20 DIAGNOSIS — I2510 Atherosclerotic heart disease of native coronary artery without angina pectoris: Secondary | ICD-10-CM | POA: Diagnosis not present

## 2015-08-20 DIAGNOSIS — D696 Thrombocytopenia, unspecified: Secondary | ICD-10-CM | POA: Diagnosis not present

## 2015-08-20 DIAGNOSIS — Z79899 Other long term (current) drug therapy: Secondary | ICD-10-CM | POA: Diagnosis not present

## 2015-08-20 DIAGNOSIS — R161 Splenomegaly, not elsewhere classified: Secondary | ICD-10-CM | POA: Insufficient documentation

## 2015-08-20 DIAGNOSIS — Z8674 Personal history of sudden cardiac arrest: Secondary | ICD-10-CM | POA: Diagnosis not present

## 2015-08-20 DIAGNOSIS — R42 Dizziness and giddiness: Secondary | ICD-10-CM | POA: Diagnosis not present

## 2015-08-20 DIAGNOSIS — Z794 Long term (current) use of insulin: Secondary | ICD-10-CM | POA: Diagnosis not present

## 2015-08-20 DIAGNOSIS — I5022 Chronic systolic (congestive) heart failure: Secondary | ICD-10-CM | POA: Insufficient documentation

## 2015-08-20 DIAGNOSIS — E1142 Type 2 diabetes mellitus with diabetic polyneuropathy: Secondary | ICD-10-CM | POA: Diagnosis not present

## 2015-08-20 DIAGNOSIS — R0683 Snoring: Secondary | ICD-10-CM

## 2015-08-20 DIAGNOSIS — E1122 Type 2 diabetes mellitus with diabetic chronic kidney disease: Secondary | ICD-10-CM | POA: Diagnosis not present

## 2015-08-20 DIAGNOSIS — N189 Chronic kidney disease, unspecified: Secondary | ICD-10-CM | POA: Diagnosis not present

## 2015-08-20 DIAGNOSIS — I6521 Occlusion and stenosis of right carotid artery: Secondary | ICD-10-CM | POA: Diagnosis not present

## 2015-08-20 DIAGNOSIS — E875 Hyperkalemia: Secondary | ICD-10-CM | POA: Diagnosis not present

## 2015-08-20 DIAGNOSIS — Z955 Presence of coronary angioplasty implant and graft: Secondary | ICD-10-CM | POA: Insufficient documentation

## 2015-08-20 LAB — BASIC METABOLIC PANEL
Anion gap: 3 — ABNORMAL LOW (ref 5–15)
BUN: 28 mg/dL — ABNORMAL HIGH (ref 6–20)
CALCIUM: 8.4 mg/dL — AB (ref 8.9–10.3)
CO2: 28 mmol/L (ref 22–32)
CREATININE: 1.39 mg/dL — AB (ref 0.61–1.24)
Chloride: 108 mmol/L (ref 101–111)
GFR, EST NON AFRICAN AMERICAN: 56 mL/min — AB (ref >60)
Glucose, Bld: 207 mg/dL — ABNORMAL HIGH (ref 65–99)
Potassium: 5 mmol/L (ref 3.5–5.1)
Sodium: 139 mmol/L (ref 135–145)

## 2015-08-20 LAB — BRAIN NATRIURETIC PEPTIDE: B Natriuretic Peptide: 320.9 pg/mL — ABNORMAL HIGH (ref 0.0–100.0)

## 2015-08-20 NOTE — Progress Notes (Signed)
Patient ID: [name], male   DOB: March 21, 1961, 54 y.o.   MRN: [id number] PCP: Dr. [name]  54 y.o. with history of CAD s/p recent PCI, cirrhosis with thrombocytopenia, cardiac arrest, and ischemic cardiomyopathy.  Patient was admitted in 7/15-8/15 with gallstone pancreatitis.  It was decided not to do a cholecystectomy due to high surgical risk.  He was sent home and went back to work.  In 10/15, he had a cardiac arrest at work and was defibrillated by AED.  He was cooled and recovered well.  Platelets were initially very low but recovered to his baseline in the 60K range.  Cardiac cath was done showing severe 2 vessel disease.  Cardiac MRI was done, showing EF 40% with a mixed viability picture.  With recovery of platelets, he had Xience DES to the LAD and the distal RCA.  He had acute on chronic systolic CHF with volume overload and was diuresed and eventually discharge.  Unfortunately, he was sent home on daily metolazone and developed AKI.  Diuretics were held and this has gradually improved.  Spironolactone and lisinopril were stopped due to hyperkalemia.  However, he was able to restart lisinopril with simultaneous use of patiromer.  K most recently was 5.  He returns today for followup.  Balance slowly improving.  No dyspnea walking on flat ground or up a flight of steps.  He will sometimes feel lightheaded/dizzy when lifting and carrying heavier objects.  BP has not been low (when checked at this office or at rehab).  No orthopnea/PND. Taking Lasix regularly.  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  No chest pain, no palpitations.  Poor sleep, fatigue during the day.   Labs (11/15): K 4.7 => 7.2, creatinine 1.37 => 2.1 => 2.25 with BUN 94, HCT 26.9, plts 67, P2Y12 241 Labs (11/20/14): K 6.2, creatinine 1.1, HCT 33.2, plts 45 Labs (12/18/14): K 6.1 creatinine 1.07 off spiro and lisinopril.  Labs (1/16): K 5.2, creatinine 1.12 Labs (2/16): LDL 59, HDL 41, HCT 32.4 Labs (3/16): K  5.4, creatinine 1.27 Labs (4/16): HCT 32.9, plts 47 Labs (5/16): K 5.2, creatinine 1.23, BNP 352 Labs (6/16): hgb 11.8, plts 41  Labs (7/16): K 4.7, creatinine 1.19 Labs (8/16): K 5, creatinine 1.28, hgb 11.8, plts 41  PMH: 1. 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  2. Thrombocytopenia: Chronic.  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   3. Gallstone pancreatitis: He did not have cholecystectomy as deemed too high a surgical risk.  4. CKD 5. 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 Bicuspid aortic valve: Moderate AS with mean gradient 25 mmHg by echo in 4/16.  7. H/o perforated diverticulum.  8. CAD: Cardiac arrest 10/15.  LHC (10/15) with 80-90% proximal ramus stenosis, 90% mLAD, subtotaled distal LAD, 90% D1, 90% dRCA/proximal PDA.  Patient had Xience DES to mLAD, dRCA.  9. Cardiac arrest: 10/15, defibrillated with AED and cooled.  10. Ischemic cardiomyopathy: Echo (8/15) with EF 35-40%, bicuspid aortic valve with mild AS/AI.  Cardiac MRI (10/15) with EF 40%, regional wall motion abnormalities, normal RV, delayed enhancement pattern suggesting mixed picture of viability. Echo (1/16) with EF 35-40%, hypokinesis of the mid to apical septum and the true apex, bicuspid aortic valve, mild AI.  Echo (4/16) with EF 40-45%, bicuspid aortic valve with moderate AS (mean gradient 25 mmHg) 11. Carotid dopplers (4/16): Mild plaque 12. Lumbar spine degenerative disc disease.   SH: Never smoked.  Prior ETOH  abuse, now stopped.  Works at [employer] and [employer].  Lives with sister in [place].   FH: No premature CAD.   ROS: All systems reviewed and negative except as per HPI.   Current Outpatient Prescriptions  Medication Sig Dispense Refill  . aspirin 81 MG chewable tablet Chew 1 tablet (81 mg total) by mouth daily.    [name] atorvastatin (LIPITOR) 20 MG tablet Take 1 tablet (20 mg total) by mouth daily at 6 PM. 30 tablet 3  . carvedilol (COREG) 25  MG tablet Take 1 tablet (25 mg total) by mouth 2 (two) times daily with a meal. 60 tablet 6  . clopidogrel (PLAVIX) 75 MG tablet Take 1 tablet (75 mg total) by mouth daily. 30 tablet 3  . furosemide (LASIX) 40 MG tablet Take 0.5 tablets (20 mg total) by mouth daily. Hold if weight is 225 lb or less 15 tablet 6  . glucose blood (FREESTYLE TEST STRIPS) test strip Use as instructed 100 each 6  . insulin aspart (NOVOLOG) 100 UNIT/ML injection Inject 0-10 Units into the skin 3 (three) times daily before meals. Sliding scale    . Insulin Degludec (TRESIBA FLEXTOUCH) 100 UNIT/ML SOPN Inject 18 Units into the skin at bedtime. 5 pen 2  . Insulin Pen Needle 32G X 6 MM MISC Use for insulin injections 4 times per day 150 each 12  . lidocaine (LIDODERM) 5 % Place 1 patch onto the skin daily. Remove & Discard patch within 12 hours or as directed by MD (Patient taking differently: Place 1 patch onto the skin as needed. Remove & Discard patch within 12 hours or as directed by MD) 30 patch 0  . lisinopril (PRINIVIL,ZESTRIL) 5 MG tablet Take 1 tablet (5 mg total) by mouth daily. 30 tablet 3  . nitroGLYCERIN (NITROSTAT) 0.4 MG SL tablet Place 1 tablet (0.4 mg total) under the tongue every 5 (five) minutes as needed for chest pain. 25 tablet 0  . patiromer (VELTASSA) 8.4 G packet Take 1 packet (8.4 g total) by mouth daily. 30 packet    No current facility-administered medications for this encounter.   BP 124/68 mmHg  Pulse 63  Wt 235 lb 8 oz (106.822 kg)  SpO2 99% General: NAD Neck: JVD 7, no thyromegaly or thyroid nodule. Left carotid bruit.  Lungs: Clear   CV: Nondisplaced PMI.  Heart regular S1/S2, no S3/S4, 2/6 early SEM RUSB. No edema.  Normal pedal pulses.  Abdomen: Soft, nontender, no hepatosplenomegaly, no distention.  Skin: Intact without lesions or rashes.  Neurologic: Alert and oriented x 3.  Psych: Normal affect. Extremities: No clubbing or cyanosis.  HEENT: Normal.   Assessment/Plan: 1.  Chronic systolic CHF: Echo 03/2015 with EF 40-45%, ischemic cardiomyopathy.  NYHA class II symptoms. No volume overload on exam today. Some dizziness with lifting and carrying.   - Continue Lasix 20 mg daily and keep sodium in diet low.  - Controlling hyperkalemia with patiromer 8.4 g/day.  I have been able to add lisinopril 5 mg daily.  Most recent K was 5 so I will not increase today or add spironolactone.  BMET today.  - Continue Coreg to 25 mg bid.  2. CAD: Status post Xience DES to LAD and RCA after cardiac arrest.  Will need to follow closely due to bleeding risk on DAPT with chronic thrombocytopenia.   He is currently on both ASA 81 and Plavix as P2Y12 test has not shown robust platelet inhibition with Plavix (most recently 241 PRU).  No evidence for bleeding.  - Continue statin - Continue ASA 81 and Plavix. Stop Plavix in 10/16.  3. Cirrhosis: Likely related to ETOH, no longer drinking.  Has associated splenomegaly and chronic thrombocytopenia.  4. Thrombocytopenia: Chronic, likely due to splenomegaly. Will need to follow plts/hemoglobin over time closely.     5. DM: Per endocrinology.  6. Hyperkalemia: Tolerating ACEI so far with use of patiromer.   7. Carotid bruit: On left, carotid dopplers with mild plaque. Will need follow up in 4/18.  8. Imbalance: May be related to diabetic neuropathy, also has history of L-spine disease. Still worried about balance and doing his job.  Is going to PT for same.  I would like him to see neurology for evaluation of imbalance, neuropathy.  9. OSA: Concern for OSA (poor sleep, fatigue).  I will arrange for a sleep study.   Followup in 10/16.   Marca Ancona 08/20/2015

## 2015-08-20 NOTE — Patient Instructions (Signed)
Labs today  You have been referred back to [facility] for further evaluation of neuropathy and imbalance  You have been referred to [facility]Traci Turner for sleep evaluation/sleep study  Your physician recommends that you schedule a follow-up appointment in [facility] October 2016  Do the following things EVERYDAY: 1) Weigh yourself in the morning before breakfast. Write it down and keep it in a log. 2) Take your medicines as prescribed 3) Eat low salt foods-Limit salt (sodium) to 2000 mg per day.  4) Stay as active as you can everyday 5) Limit all fluids for the day to less than 2 liters 6)

## 2015-08-25 ENCOUNTER — Ambulatory Visit (INDEPENDENT_AMBULATORY_CARE_PROVIDER_SITE_OTHER): Payer: 59 | Admitting: Family Medicine

## 2015-08-25 ENCOUNTER — Encounter: Payer: Self-pay | Admitting: Family Medicine

## 2015-08-25 ENCOUNTER — Ambulatory Visit: Payer: 59 | Admitting: Internal Medicine

## 2015-08-25 VITALS — BP 173/97 | HR 64 | Temp 97.8°F | Resp 16 | Wt 237.0 lb

## 2015-08-25 DIAGNOSIS — D696 Thrombocytopenia, unspecified: Secondary | ICD-10-CM

## 2015-08-25 DIAGNOSIS — N189 Chronic kidney disease, unspecified: Secondary | ICD-10-CM

## 2015-08-25 DIAGNOSIS — G4700 Insomnia, unspecified: Secondary | ICD-10-CM

## 2015-08-25 DIAGNOSIS — R200 Anesthesia of skin: Secondary | ICD-10-CM

## 2015-08-25 DIAGNOSIS — Z23 Encounter for immunization: Secondary | ICD-10-CM | POA: Diagnosis not present

## 2015-08-25 DIAGNOSIS — N182 Chronic kidney disease, stage 2 (mild): Secondary | ICD-10-CM

## 2015-08-25 DIAGNOSIS — I10 Essential (primary) hypertension: Secondary | ICD-10-CM | POA: Diagnosis not present

## 2015-08-25 DIAGNOSIS — I255 Ischemic cardiomyopathy: Secondary | ICD-10-CM

## 2015-08-25 DIAGNOSIS — K7581 Nonalcoholic steatohepatitis (NASH): Secondary | ICD-10-CM

## 2015-08-25 DIAGNOSIS — R208 Other disturbances of skin sensation: Secondary | ICD-10-CM

## 2015-08-25 DIAGNOSIS — E875 Hyperkalemia: Secondary | ICD-10-CM

## 2015-08-25 NOTE — Addendum Note (Signed)
Addended by: Westley Hummer on: 08/25/2015 11:17 AM   Modules accepted: Orders

## 2015-08-25 NOTE — Progress Notes (Signed)
OFFICE VISIT  08/25/2015   CC:  Chief Complaint  Patient presents with  . Follow-up    Pt is not fasting.   HPI:    Patient is a 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 Followed by endo for DM 2 with complications, hx of poor control. Followed by cardiology, finished cardiac rehab 04/2015, most recent o/v with them was 08/20/15.  He is due to come off of his plavix 09/2015. Tolerating low dose ACE-I with use of patiromer in order to avoid hyperkalemia, which he has a hx of.    Dr. Shirlee Latch also wanted neuro to see him for his chronic balance problems and L leg progressive weakness assoc with decreased sensation from knee down in L leg. Also is arranging for him to have a sleep study due to excessive somnolence/fatigue.  Describes excessive restlessness--can't get comfortable, says moving around doesn't help.  Describes his thoughts/mind not shutting off, thinking about all his medical problems.  I don't get a sense that he is suffering from RLS. He is active in therapy, working on balance. He is not back to work yet, gradually getting back into it.  Denies feeling swollen in abd region, has some intermittent bilat LE edema. BP monitoring at home: systolic range 90s to 150, diast 70--uses wrist cuff. No chest pain.    Past Medical History  Diagnosis Date  . Heart murmur     ECHO 07/2011 showed mild AS and LVH.  TEE 09/2011 showed small PFO, bicuspid aortic valve but no stenosis or regurg.  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  Gen surg and GI recommended flagyl and cipro x 2 wks..  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  . Cholelithiasis     u/s--no cholecystitis.  06/2014 u/s showed no stones, only GB  sludge  . Peripheral neuropathy     Diabetic:  Autonomic (pelvic) and LE polyneuropathy--[facility] testing showed that it is likely from DM  . Diabetic foot ulcers     Poor healing; normal ABIs and TBIs  . Arthritis     back L3 - L5  . Hyperactive gag reflex   . History of pyelonephritis   . Type II or unspecified type diabetes mellitus with neurological manifestations, not stated as uncontrolled     IDDM: neuro, renal, and ophth complications  . Wears dentures     upper  . Osteomyelitis of toe of left foot     left 4th and 5th toes--now s/p amputation of these areas  . Diabetic retinopathy associated with type 2 diabetes mellitus     Laser tx in both eyes (Dr. Allyne Gee)  . Diabetic nephropathy     Proteinuria and CrCl 55-65  . Chronic renal insufficiency, stage III (moderate)     CrCl 50s-80s 2014 to pre-VFIB arrest 10/02/14, at which time he sustained AKI.  . Lumbar spondylosis     MRI 08/25/11 with multilevel facet dz, small disc protrusion at L5-S1 to the right without impingement  . Ischemic cardiomyopathy 07/22/14    Myoview w/out ischemia but large scar (inferior/apex) w/ global hypokinesis--medical mgmt  . History of pancreatitis 2014 and 2015    Suspected biliary: passing small gallstones: surgery declined to remove GB 07/2014.  . Aspiration  pneumonia   . Thrombocytopenia   . Portal hypertension   . Liver cirrhosis secondary to NASH     ?+ alcohol?.  With hx of splenomegaly and ascities (dx approx 2012)  . History of gram negative sepsis 2015    e coli  . Ventricular fibrillation 10/02/14    Resuscitated, hospitalized, 3 V CAD detected, DES stent to mid LAD and to RCA were placed.    Past Surgical History  Procedure Laterality Date  . Knee arthroscopy  2001    Bilateral  . Amputation  10/24/2012    Procedure: AMPUTATION RAY;  Surgeon: Sherri Rad, MD;  Location: [facility];  Service: Orthopedics;  Laterality: Left;  left 4th toe amputation through MTP joint,  5th Ray amputation   . Knee surgery  2001  . Hida scan  06/2014    Normal  . Transthoracic echocardiogram  07/19/14;01/07/15    Mild LV dilation and hypertrophy, EF 35-40%, no wall motion abnormalities, grade II diast dysfxn, bicuspid aortic valve with mild AS and mild AI.  No change on 12/2014 echo.  . Cardiovascular stress test  07/2014    Myoview w/out ischemia but large scar (inferior/apex) w/ global hypokinesis--medical mgmt  . Eye surgery  08/2014    Retinal hemorrhage evacuation  . Coronary angioplasty with stent placement  09/2014    DES to mid LAD and RCA; EF 40% by cardiac MRI  . Cardiac mri  09/2014    Moderately dilated LV.  EF 40%, multiple wall motion abnormalities, normal RV fxn.  Some areas of scarring.  . Left heart catheterization with coronary angiogram N/A 10/10/2014    Procedure: LEFT HEART CATHETERIZATION WITH CORONARY ANGIOGRAM;  Surgeon: Peter M Swaziland, MD;  Location: [facility] CATH LAB;  Service: Cardiovascular;  Laterality: N/A;  . Left heart catheterization with coronary angiogram N/A 10/15/2014    Procedure: LEFT HEART CATHETERIZATION WITH CORONARY ANGIOGRAM;  Surgeon: Iran Ouch, MD;  Location: [facility] CATH LAB;  Service: Cardiovascular;  Laterality: N/A;  . Carotid dopplers  04/10/15    1-35% bilat; normal subclavians    Outpatient Prescriptions Prior to Visit  Medication Sig Dispense Refill  . aspirin 81 MG chewable tablet Chew 1 tablet (81 mg total) by mouth daily.    [name] atorvastatin (LIPITOR) 20 MG tablet Take 1 tablet (20 mg total) by mouth daily at 6 PM. 30 tablet 3  . carvedilol (COREG) 25 MG tablet Take 1 tablet (25 mg total) by mouth 2 (two) times daily with a meal. 60 tablet 6  . clopidogrel (PLAVIX) 75 MG tablet Take 1 tablet (75 mg total) by mouth daily. 30 tablet 3  . furosemide (LASIX) 40 MG tablet Take 0.5 tablets (20 mg total) by mouth daily. Hold if weight is 225 lb or less 15 tablet 6  . glucose blood (FREESTYLE TEST STRIPS) test strip Use as instructed  100 each 6  . insulin aspart (NOVOLOG) 100 UNIT/ML injection Inject 0-10 Units into the skin 3 (three) times daily before meals. Sliding scale    . Insulin Degludec (TRESIBA FLEXTOUCH) 100 UNIT/ML SOPN Inject 18 Units into the skin at bedtime. 5 pen 2  . Insulin Pen Needle 32G X 6 MM MISC Use for insulin injections 4 times per day 150 each 12  . lidocaine (LIDODERM) 5 % Place 1 patch onto the skin daily. Remove & Discard patch within 12 hours or as directed by MD (Patient taking differently: Place 1 patch onto the skin as needed. Remove &  Discard patch within 12 hours or as directed by MD) 30 patch 0  . lisinopril (PRINIVIL,ZESTRIL) 5 MG tablet Take 1 tablet (5 mg total) by mouth daily. 30 tablet 3  . nitroGLYCERIN (NITROSTAT) 0.4 MG SL tablet Place 1 tablet (0.4 mg total) under the tongue every 5 (five) minutes as needed for chest pain. 25 tablet 0  . patiromer (VELTASSA) 8.4 G packet Take 1 packet (8.4 g total) by mouth daily. 30 packet    No facility-administered medications prior to visit.    Allergies  Allergen Reactions  . Iodine Other (See Comments)    Family history of anaphylaxis, unsure if patient has reaction  . Other Shortness Of Breath, Itching and Other (See Comments)    PERFUMES - SNEEZING  . Hydralazine Hcl Hives  . Naproxen Nausea And Vomiting    ROS As per HPI  PE: Blood pressure 173/97, pulse 64, temperature 97.8 F (36.6 C), temperature source Oral, resp. rate 16, weight 237 lb (107.502 kg), SpO2 99 %.BP repeat manual 160/88 Gen: Alert, well appearing.  Patient is oriented to person, place, time, and situation. Neck - No masses or thyromegaly or limitation in range of motion CV: RRR, 2/6 syst murmur heard best at RUSB, radiates to R infraclavicular region. Chest is clear, no wheezing or rales. Normal symmetric air entry throughout both lung fields. No chest wall deformities or tenderness. ABD: soft, NT, ND but rotund/obese, BS normal.  No hepatospenomegaly or mass.   No bruits. EXT: 1+ pitting edema pretibial region R LL, 3+ pitting edema pretibial region L LL.  Chronic venous stasis pigment changes noted diffusely in both LLs. Leg strength 5/5 prox and dist bilat, cannot elicit DTRs in knees or ankles bilat.  Decreased fine touch sensation L LL. He has 4th and 5th toes amputated from L foot.  LABS:  Lab Results  Component Value Date   HGBA1C 6.4 05/22/2015      Chemistry      Component Value Date/Time   NA 139 08/20/2015 1146   K 5.0 08/20/2015 1146   CL 108 08/20/2015 1146   CO2 28 08/20/2015 1146   BUN 28* 08/20/2015 1146   CREATININE 1.39* 08/20/2015 1146   CREATININE 1.27 02/20/2015 1006      Component Value Date/Time   CALCIUM 8.4* 08/20/2015 1146   ALKPHOS 165* 12/18/2014 1016   AST 32 12/18/2014 1016   ALT 27 12/18/2014 1016   BILITOT 0.7 12/18/2014 1016     Lab Results  Component Value Date   WBC 5.5 07/20/2015   HGB 11.6* 07/20/2015   HCT 33.3* 07/20/2015   MCV 88.8 07/20/2015   PLT 42* 07/20/2015   Lab Results  Component Value Date   TSH 1.900 07/19/2014   Lab Results  Component Value Date   CHOL 132 02/09/2015   HDL 41 02/09/2015   LDLCALC 59 02/09/2015   TRIG 160* 02/09/2015   CHOLHDL 3.2 02/09/2015    IMPRESSION AND PLAN:  1) CAD with ischemic CM: stable, followed closely by Dr. Shirlee Latch.   No CP.  Appears to have no volume overload. Continue all current meds, with plan for d/c plavix mid October this year as per cardiologist.  2) HTN: he has some periods of low/low-normal bp (esp with exertion) that makes me hesitant to treat his elevated bp today in office.  He'll continue home monitoring and notify me if bp consistently >160/110 and NOT having any low bp readings.  3) NASH with portal HTN/splenomegaly and resultant thrombocytopenia.  Platelets and LFTs stable.  Too early for platelet recheck today. He does not drink ETOH anymore.  4) Hx of hyperkalemia: tolerating low dose ACE-I as long as he is taking  patiromer.  5) CRI, stage II/III: stable on most recent chemistry panel 08/20/15.  6) DM 2: most recent A1c showed great control.  Mgmt per Dr. Oswaldo Conroy.  7) Left leg balance problems, question of true weakness or not: I think his sensory problems with LLL is the issue (+ some contribution from lack of 4th and 5th toes).  Agree with upcoming neuro re-eval (has hx of periph neuropathy, likely from DM).  8) Insomnia/sleep dysfunction: agree with sleep study plan as per Dr. Shirlee Latch. If sleep study neg for OSA, will consider sedative hypnotic sleep aid trial.  An After Visit Summary was printed and given to the patient.  FOLLOW UP: No Follow-up on file.

## 2015-08-25 NOTE — Progress Notes (Signed)
Pre visit review using our clinic review tool, if applicable. No additional management support is needed unless otherwise documented below in the visit note. 

## 2015-08-31 ENCOUNTER — Telehealth (HOSPITAL_COMMUNITY): Payer: Self-pay | Admitting: Vascular Surgery

## 2015-08-31 NOTE — Telephone Encounter (Signed)
Left pt message to make 6 week f/u

## 2015-09-01 ENCOUNTER — Encounter: Payer: Self-pay | Admitting: Family Medicine

## 2015-09-02 ENCOUNTER — Encounter (HOSPITAL_COMMUNITY): Payer: Self-pay | Admitting: *Deleted

## 2015-09-02 NOTE — Progress Notes (Signed)
[employer] form completed by Dr Shirlee Latch and faxed in along with 9/1 OV note

## 2015-09-11 ENCOUNTER — Emergency Department (HOSPITAL_COMMUNITY)
Admission: EM | Admit: 2015-09-11 | Discharge: 2015-09-11 | Disposition: A | Discharge status: Home / Self Care | Payer: 59 | Attending: Emergency Medicine | Admitting: Emergency Medicine

## 2015-09-11 ENCOUNTER — Telehealth (HOSPITAL_COMMUNITY): Payer: Self-pay | Admitting: Vascular Surgery

## 2015-09-11 ENCOUNTER — Encounter (HOSPITAL_COMMUNITY): Payer: Self-pay | Admitting: Emergency Medicine

## 2015-09-11 DIAGNOSIS — Z7982 Long term (current) use of aspirin: Secondary | ICD-10-CM | POA: Diagnosis not present

## 2015-09-11 DIAGNOSIS — R011 Cardiac murmur, unspecified: Secondary | ICD-10-CM | POA: Insufficient documentation

## 2015-09-11 DIAGNOSIS — M1990 Unspecified osteoarthritis, unspecified site: Secondary | ICD-10-CM | POA: Diagnosis not present

## 2015-09-11 DIAGNOSIS — Z794 Long term (current) use of insulin: Secondary | ICD-10-CM | POA: Insufficient documentation

## 2015-09-11 DIAGNOSIS — I129 Hypertensive chronic kidney disease with stage 1 through stage 4 chronic kidney disease, or unspecified chronic kidney disease: Secondary | ICD-10-CM | POA: Diagnosis not present

## 2015-09-11 DIAGNOSIS — E1121 Type 2 diabetes mellitus with diabetic nephropathy: Secondary | ICD-10-CM | POA: Diagnosis not present

## 2015-09-11 DIAGNOSIS — Z87448 Personal history of other diseases of urinary system: Secondary | ICD-10-CM | POA: Insufficient documentation

## 2015-09-11 DIAGNOSIS — Z7902 Long term (current) use of antithrombotics/antiplatelets: Secondary | ICD-10-CM | POA: Diagnosis not present

## 2015-09-11 DIAGNOSIS — Z79899 Other long term (current) drug therapy: Secondary | ICD-10-CM | POA: Diagnosis not present

## 2015-09-11 DIAGNOSIS — Z862 Personal history of diseases of the blood and blood-forming organs and certain disorders involving the immune mechanism: Secondary | ICD-10-CM | POA: Diagnosis not present

## 2015-09-11 DIAGNOSIS — Z8701 Personal history of pneumonia (recurrent): Secondary | ICD-10-CM | POA: Insufficient documentation

## 2015-09-11 DIAGNOSIS — H1132 Conjunctival hemorrhage, left eye: Secondary | ICD-10-CM | POA: Diagnosis not present

## 2015-09-11 DIAGNOSIS — Z8719 Personal history of other diseases of the digestive system: Secondary | ICD-10-CM | POA: Diagnosis not present

## 2015-09-11 DIAGNOSIS — H578 Other specified disorders of eye and adnexa: Secondary | ICD-10-CM | POA: Diagnosis present

## 2015-09-11 DIAGNOSIS — N183 Chronic kidney disease, stage 3 (moderate): Secondary | ICD-10-CM | POA: Diagnosis not present

## 2015-09-11 NOTE — Telephone Encounter (Signed)
Late entry:  Pt walked into lobby at 9 am with is boss stating he needed an ok to work with his eye issue, per Dr Shirlee Latch I advised pt should have eye evaluated at opthalmology or urgent care, pt aware and agreeable

## 2015-09-11 NOTE — Discharge Instructions (Signed)
Subconjunctival Hemorrhage °A subconjunctival hemorrhage is a bright red patch covering a portion of the white of the eye. The white part of the eye is called the sclera, and it is covered by a thin membrane called the conjunctiva. This membrane is clear, except for tiny blood vessels that you can see with the naked eye. When your eye is irritated or inflamed and becomes red, it is because the vessels in the conjunctiva are swollen. °Sometimes, a blood vessel in the conjunctiva can break and bleed. When this occurs, the blood builds up between the conjunctiva and the sclera, and spreads out to create a red area. The red spot may be very small at first. It may then spread to cover a larger part of the surface of the eye, or even all of the visible white part of the eye. °In almost all cases, the blood will go away and the eye will become white again. Before completely dissolving, however, the red area may spread. It may also become brownish-yellow in color before going away. If a lot of blood collects under the conjunctiva, it may look like a bulge on the surface of the eye. This looks scary, but it will also eventually flatten out and go away. Subconjunctival hemorrhages do not cause pain, but if swollen, may cause a feeling of irritation. There is no effect on vision.  °CAUSES  °· The most common cause is mild trauma (rubbing the eye, irritation). °· Subconjunctival hemorrhages can happen because of coughing or straining (lifting heavy objects), vomiting, or sneezing. °· In some cases, your doctor may want to check your blood pressure. High blood pressure can also cause a subconjunctival hemorrhage. °· Severe trauma or blunt injuries. °· Diseases that affect blood clotting (hemophilia, leukemia). °· Abnormalities of blood vessels behind the eye (carotid cavernous sinus fistula). °· Tumors behind the eye. °· Certain drugs (aspirin, Coumadin, heparin). °· Recent eye surgery. °HOME CARE INSTRUCTIONS  °· Do not worry  about the appearance of your eye. You may continue your usual activities. °· Often, follow-up is not necessary. °SEEK MEDICAL CARE IF:  °· Your eye becomes painful. °· The bleeding does not disappear within 3 weeks. °· Bleeding occurs elsewhere, for example, under the skin, in the mouth, or in the other eye. °· You have recurring subconjunctival hemorrhages. °SEEK IMMEDIATE MEDICAL CARE IF:  °· Your vision changes or you have difficulty seeing. °· You develop a severe headache, persistent vomiting, confusion, or abnormal drowsiness (lethargy). °· Your eye seems to bulge or protrude from the eye socket. °· You notice the sudden appearance of bruises or have spontaneous bleeding elsewhere on your body. °Document Released: 12/05/2005 Document Revised: 04/21/2014 Document Reviewed: 11/02/2009 °ExitCare® Patient Information ©2015 [employer]. This information is not intended to replace advice given to you by your health care provider. Make sure you discuss any questions you have with your health care provider. ° °

## 2015-09-11 NOTE — ED Notes (Signed)
Patient states eye "has blood in it".   Patient doesn't complain of any pain.  Patient denies injury.  Patient states "i woke up this morning and it was just like that".

## 2015-09-11 NOTE — Telephone Encounter (Signed)
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

## 2015-09-11 NOTE — ED Provider Notes (Signed)
CSN: [id number]     Arrival date & time 09/11/15  4098 History  This chart was scribed for non-physician practitioner Roxy Horseman, PA-C, working with Lorre Nick, MD, by Tanda Rockers, ED Scribe. This patient was seen in room TR05C/TR05C and the patient's care was started at 10:00 AM.  Chief Complaint  Patient presents with  . Eye Injury   The history is provided by the patient. No language interpreter was used.     HPI Comments: [name] is a 54 y.o. male who presents to the Emergency Department complaining of left eye redness that began sometime this morning. Pt reports his eye was normal last night before going to bed but cannot say when the redness began. Pt states his eye feels "tired" but is otherwise complaint free. Denies any visual changes, eye pain, or any other associated symptoms. Additionally, he specifically denies any flashing lights, or any floaters. Denies any visual field loss. He notes similar symptoms to right eye approximately 1 year ago and had retinal hemorrhage evacuation at that time. Pt spoke to ophthalmologist today and was told to come to the office on Monday, 09/14/2015 (3 days from now). Pt is currently on Plavix.    Past Medical History  Diagnosis Date  . Heart murmur     ECHO 07/2011 showed mild AS and LVH.  TEE 09/2011 showed small PFO, bicuspid aortic valve but no stenosis or regurg.  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  Gen surg and GI recommended flagyl and cipro x 2 wks..  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  . Cholelithiasis     u/s--no cholecystitis.  06/2014 u/s showed no stones, only GB sludge  . Peripheral neuropathy     Diabetic:  Autonomic (pelvic) and LE polyneuropathy--[facility] testing showed that it is likely from DM  . Diabetic foot ulcers     Poor healing; normal  ABIs and TBIs  . Arthritis     back L3 - L5  . Hyperactive gag reflex   . History of pyelonephritis   . Type II or unspecified type diabetes mellitus with neurological manifestations, not stated as uncontrolled     IDDM: neuro, renal, and ophth complications  . Wears dentures     upper  . Osteomyelitis of toe of left foot     left 4th and 5th toes--now s/p amputation of these areas  . Diabetic retinopathy associated with type 2 diabetes mellitus     Laser tx in both eyes (Dr. Allyne Gee): severe nonprolif diab retpthy, diabetic macular edema (05/2015)  . Diabetic nephropathy     Proteinuria and CrCl 55-65  . Chronic renal insufficiency, stage III (moderate)     CrCl 50s-80s 2014 to pre-VFIB arrest 10/02/14, at which time he sustained AKI.  . Lumbar spondylosis     MRI 08/25/11 with multilevel facet dz, small disc protrusion at L5-S1 to the right without impingement  . Ischemic cardiomyopathy 07/22/14    Myoview w/out ischemia but large scar (inferior/apex) w/ global hypokinesis--medical mgmt  . History of pancreatitis 2014 and 2015    Suspected biliary: passing small gallstones: surgery declined to remove GB 07/2014.  . Aspiration pneumonia   . Thrombocytopenia   . Portal hypertension   . Liver cirrhosis secondary to NASH     ?+ alcohol?.  With hx of splenomegaly and ascities (  dx approx 2012)  . History of gram negative sepsis 2015    e coli  . Ventricular fibrillation 10/02/14    Resuscitated, hospitalized, 3 V CAD detected, DES stent to mid LAD and to RCA were placed.  . Peripheral edema     chronic asymmetric LE edema (L>>R)   Past Surgical History  Procedure Laterality Date  . Knee arthroscopy  2001    Bilateral  . Amputation  10/24/2012    Procedure: AMPUTATION RAY;  Surgeon: Sherri Rad, MD;  Location: [facility];  Service: Orthopedics;  Laterality: Left;  left 4th toe amputation through MTP joint, 5th Ray amputation   . Knee surgery  2001  . Hida scan  06/2014     Normal  . Transthoracic echocardiogram  07/19/14;01/07/15    Mild LV dilation and hypertrophy, EF 35-40%, no wall motion abnormalities, grade II diast dysfxn, bicuspid aortic valve with mild AS and mild AI.  No change on 12/2014 echo.  . Cardiovascular stress test  07/2014    Myoview w/out ischemia but large scar (inferior/apex) w/ global hypokinesis--medical mgmt  . Eye surgery  08/2014    Retinal hemorrhage evacuation  . Coronary angioplasty with stent placement  09/2014    DES to mid LAD and RCA; EF 40% by cardiac MRI  . Cardiac mri  09/2014    Moderately dilated LV.  EF 40%, multiple wall motion abnormalities, normal RV fxn.  Some areas of scarring.  . Left heart catheterization with coronary angiogram N/A 10/10/2014    Procedure: LEFT HEART CATHETERIZATION WITH CORONARY ANGIOGRAM;  Surgeon: Peter M Swaziland, MD;  Location: [facility] CATH LAB;  Service: Cardiovascular;  Laterality: N/A;  . Left heart catheterization with coronary angiogram N/A 10/15/2014    Procedure: LEFT HEART CATHETERIZATION WITH CORONARY ANGIOGRAM;  Surgeon: Iran Ouch, MD;  Location: [facility] CATH LAB;  Service: Cardiovascular;  Laterality: N/A;  . Carotid dopplers  04/10/15    1-35% bilat; normal subclavians   Family History  Problem Relation Age of Onset  . Heart disease Father   . Diabetes type II Sister    Social History  Substance Use Topics  . Smoking status: Never Smoker   . Smokeless tobacco: Current User    Types: Chew     Comment: occasional 1 can per 2 weeks  . Alcohol Use: No     Comment: Quit drinking alcohol 08/20/2011    Review of Systems  Eyes: Positive for redness (Left). Negative for pain and visual disturbance.  Respiratory: Negative for shortness of breath.   Cardiovascular: Negative for chest pain.  Gastrointestinal: Negative for nausea and vomiting.  Neurological: Negative for dizziness, syncope, speech difficulty and light-headedness.   Allergies  Iodine; Other; Hydralazine hcl; and  Naproxen  Home Medications   Prior to Admission medications   Medication Sig Start Date End Date Taking? Authorizing Provider  aspirin 81 MG chewable tablet Chew 1 tablet (81 mg total) by mouth daily. 10/18/14   Lonia Blood, MD  atorvastatin (LIPITOR) 20 MG tablet Take 1 tablet (20 mg total) by mouth daily at 6 PM. 04/20/15   [name], MD  carvedilol (COREG) 25 MG tablet Take 1 tablet (25 mg total) by mouth 2 (two) times daily with a meal. 05/15/15   Laurey Morale, MD  clopidogrel (PLAVIX) 75 MG tablet Take 1 tablet (75 mg total) by mouth daily. 05/08/15   [name], MD  furosemide (LASIX) 40 MG tablet Take 0.5 tablets (20  mg total) by mouth daily. Hold if weight is 225 lb or less 07/22/15   Laurey Morale, MD  glucose blood (FREESTYLE TEST STRIPS) test strip Use as instructed 11/05/14   [name], MD  insulin aspart (NOVOLOG) 100 UNIT/ML injection Inject 0-10 Units into the skin 3 (three) times daily before meals. Sliding scale    Historical Provider, MD  Insulin Degludec (TRESIBA FLEXTOUCH) 100 UNIT/ML SOPN Inject 18 Units into the skin at bedtime. 02/19/15   Carlus Pavlov, MD  Insulin Pen Needle 32G X 6 MM MISC Use for insulin injections 4 times per day 03/05/15   [name], MD  lidocaine (LIDODERM) 5 % Place 1 patch onto the skin daily. Remove & Discard patch within 12 hours or as directed by MD Patient taking differently: Place 1 patch onto the skin as needed. Remove & Discard patch within 12 hours or as directed by MD 10/18/14   Lonia Blood, MD  lisinopril (PRINIVIL,ZESTRIL) 5 MG tablet Take 1 tablet (5 mg total) by mouth daily. 07/22/15   Laurey Morale, MD  nitroGLYCERIN (NITROSTAT) 0.4 MG SL tablet Place 1 tablet (0.4 mg total) under the tongue every 5 (five) minutes as needed for chest pain. 11/20/14   Laurey Morale, MD  patiromer [facility]) 8.4 G packet Take 1 packet (8.4 g total) by mouth daily. 06/12/15   Laurey Morale, MD   Triage Vitals: BP  169/69 mmHg  Pulse 66  Temp(Src) 98 F (36.7 C) (Oral)  Resp 16  SpO2 99%   Physical Exam  Constitutional: He is oriented to person, place, and time. He appears well-developed and well-nourished. No distress.  HENT:  Head: Normocephalic and atraumatic.  Eyes: Conjunctivae and EOM are normal.  Left eye remarkable for lateral subconjunctival hemorrhage No hyphema  Neck: Neck supple. No tracheal deviation present.  Cardiovascular: Normal rate.   Pulmonary/Chest: Effort normal. No respiratory distress.  Musculoskeletal: Normal range of motion.  Neurological: He is alert and oriented to person, place, and time.  Skin: Skin is warm and dry.  Psychiatric: He has a normal mood and affect. His behavior is normal.  Nursing note and vitals reviewed.   ED Course  Procedures (including critical care time)  DIAGNOSTIC STUDIES: Oxygen Saturation is 99% on RA, normal by my interpretation.    COORDINATION OF CARE: 10:06 AM-Discussed treatment plan which includes following up with ophthalmologist on Monday as scheduled with pt at bedside and pt agreed to plan.   Labs Review Labs Reviewed - No data to display  Imaging Review No results found.   EKG Interpretation None      MDM   Final diagnoses:  Subconjunctival hemorrhage of left eye    Patient with left-sided subconjunctival hemorrhage. Denies any floaters or flashing lights. Denies any vision loss or visual field cuts. He has an appointment with his ophthalmologist on Monday. Return precautions discussed. Will discharged home. Patient instructed to use artificial tears as needed. He is stable and ready for discharge.  I personally performed the services described in this documentation, which was scribed in my presence. The recorded information has been reviewed and is accurate.      Ryhan Penney, PA-C 09/11/15 1014  Lorre Nick, MD 09/15/15 1240

## 2015-09-14 ENCOUNTER — Ambulatory Visit (HOSPITAL_COMMUNITY)
Admission: RE | Admit: 2015-09-14 | Discharge: 2015-09-14 | Disposition: A | Discharge status: Home / Self Care | Payer: 59 | Source: Ambulatory Visit | Attending: Internal Medicine | Admitting: Internal Medicine

## 2015-09-14 ENCOUNTER — Encounter (HOSPITAL_COMMUNITY): Payer: Self-pay | Admitting: *Deleted

## 2015-09-14 VITALS — BP 130/67 | HR 66 | Wt 234.0 lb

## 2015-09-14 DIAGNOSIS — R161 Splenomegaly, not elsewhere classified: Secondary | ICD-10-CM | POA: Insufficient documentation

## 2015-09-14 DIAGNOSIS — N189 Chronic kidney disease, unspecified: Secondary | ICD-10-CM | POA: Diagnosis not present

## 2015-09-14 DIAGNOSIS — I2510 Atherosclerotic heart disease of native coronary artery without angina pectoris: Secondary | ICD-10-CM | POA: Insufficient documentation

## 2015-09-14 DIAGNOSIS — K7581 Nonalcoholic steatohepatitis (NASH): Secondary | ICD-10-CM | POA: Diagnosis not present

## 2015-09-14 DIAGNOSIS — Z7982 Long term (current) use of aspirin: Secondary | ICD-10-CM | POA: Diagnosis not present

## 2015-09-14 DIAGNOSIS — Z8674 Personal history of sudden cardiac arrest: Secondary | ICD-10-CM | POA: Insufficient documentation

## 2015-09-14 DIAGNOSIS — E1142 Type 2 diabetes mellitus with diabetic polyneuropathy: Secondary | ICD-10-CM | POA: Insufficient documentation

## 2015-09-14 DIAGNOSIS — E875 Hyperkalemia: Secondary | ICD-10-CM | POA: Diagnosis not present

## 2015-09-14 DIAGNOSIS — Z794 Long term (current) use of insulin: Secondary | ICD-10-CM | POA: Insufficient documentation

## 2015-09-14 DIAGNOSIS — D696 Thrombocytopenia, unspecified: Secondary | ICD-10-CM

## 2015-09-14 DIAGNOSIS — I5022 Chronic systolic (congestive) heart failure: Secondary | ICD-10-CM | POA: Diagnosis not present

## 2015-09-14 DIAGNOSIS — Z7902 Long term (current) use of antithrombotics/antiplatelets: Secondary | ICD-10-CM | POA: Diagnosis not present

## 2015-09-14 DIAGNOSIS — E1122 Type 2 diabetes mellitus with diabetic chronic kidney disease: Secondary | ICD-10-CM | POA: Diagnosis not present

## 2015-09-14 DIAGNOSIS — Z79899 Other long term (current) drug therapy: Secondary | ICD-10-CM | POA: Diagnosis not present

## 2015-09-14 DIAGNOSIS — I255 Ischemic cardiomyopathy: Secondary | ICD-10-CM | POA: Diagnosis not present

## 2015-09-14 DIAGNOSIS — K7460 Unspecified cirrhosis of liver: Secondary | ICD-10-CM | POA: Diagnosis not present

## 2015-09-14 DIAGNOSIS — I6522 Occlusion and stenosis of left carotid artery: Secondary | ICD-10-CM | POA: Insufficient documentation

## 2015-09-14 DIAGNOSIS — H1130 Conjunctival hemorrhage, unspecified eye: Secondary | ICD-10-CM | POA: Diagnosis not present

## 2015-09-14 NOTE — Progress Notes (Signed)
Patient ID: [name], male   DOB: 10/13/1961, 54 y.o.   MRN: [id number] PCP: Dr. [name]  54 y.o. with history of CAD s/p recent PCI, cirrhosis with thrombocytopenia, cardiac arrest, and ischemic cardiomyopathy.  Patient was admitted in 7/15-8/15 with gallstone pancreatitis.  It was decided not to do a cholecystectomy due to high surgical risk.  He was sent home and went back to work.  In 10/15, he had a cardiac arrest at work and was defibrillated by AED.  He was cooled and recovered well.  Platelets were initially very low but recovered to his baseline in the 60K range.  Cardiac cath was done showing severe 2 vessel disease.  Cardiac MRI was done, showing EF 40% with a mixed viability picture.  With recovery of platelets, he had Xience DES to the LAD and the distal RCA.  He had acute on chronic systolic CHF with volume overload and was diuresed and eventually discharge.  Unfortunately, he was sent home on daily metolazone and developed AKI.  Diuretics were held and this has gradually improved.  Spironolactone and lisinopril were stopped due to hyperkalemia.  However, he was able to restart lisinopril with simultaneous use of patiromer.  K most recently was 5.  He returns today for followup.  He has been back at work since 9/7.  He fatigues easily.  No dyspnea walking on flat ground or up a flight of steps.  Lightheadedness has improved.  BP has not been low (when checked at this office or at rehab).  No orthopnea/PND. Taking Lasix regularly.  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  No chest pain, no palpitations.  Sleep study scheduled for 11/16.  He developed a subconjunctival hemorrhage last week.  He saw an ophthalmologist who told him that no treatment was needed.    Labs (11/15): K 4.7 => 7.2, creatinine 1.37 => 2.1 => 2.25 with BUN 94, HCT 26.9, plts 67, P2Y12 241 Labs (11/20/14): K 6.2, creatinine 1.1, HCT 33.2, plts 45 Labs (12/18/14): K 6.1 creatinine 1.07 off spiro and  lisinopril.  Labs (1/16): K 5.2, creatinine 1.12 Labs (2/16): LDL 59, HDL 41, HCT 32.4 Labs (3/16): K 5.4, creatinine 1.27 Labs (4/16): HCT 32.9, plts 47 Labs (5/16): K 5.2, creatinine 1.23, BNP 352 Labs (6/16): hgb 11.8, plts 41  Labs (7/16): K 4.7, creatinine 1.19 Labs (8/16): K 5, creatinine 1.28, hgb 11.8, plts 41 Labs (9/16): K 5, creatinine 1.39, BNP 321  PMH: 1. 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  2. Thrombocytopenia: Chronic.  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   3. Gallstone pancreatitis: He did not have cholecystectomy as deemed too high a surgical risk.  4. CKD 5. 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 Bicuspid aortic valve: Moderate AS with mean gradient 25 mmHg by echo in 4/16.  7. H/o perforated diverticulum.  8. CAD: Cardiac arrest 10/15.  LHC (10/15) with 80-90% proximal ramus stenosis, 90% mLAD, subtotaled distal LAD, 90% D1, 90% dRCA/proximal PDA.  Patient had Xience DES to mLAD, dRCA.  9. Cardiac arrest: 10/15, defibrillated with AED and cooled.  10. Ischemic cardiomyopathy: Echo (8/15) with EF 35-40%, bicuspid aortic valve with mild AS/AI.  Cardiac MRI (10/15) with EF 40%, regional wall motion abnormalities, normal RV, delayed enhancement pattern suggesting mixed picture of viability. Echo (1/16) with EF 35-40%, hypokinesis of the mid to apical septum and the true apex, bicuspid aortic valve, mild AI.  Echo (4/16) with EF 40-45%,  bicuspid aortic valve with moderate AS (mean gradient 25 mmHg) 11. Carotid dopplers (4/16): Mild plaque 12. Lumbar spine degenerative disc disease.   SH: Never smoked.  Prior ETOH abuse, now stopped.  Works at [employer] and [employer].  Lives with sister in [place].   FH: No premature CAD.   ROS: All systems reviewed and negative except as per HPI.   Current Outpatient Prescriptions  Medication Sig Dispense Refill  . aspirin 81 MG chewable tablet Chew 1 tablet (81 mg total)  by mouth daily.    [name] atorvastatin (LIPITOR) 20 MG tablet Take 1 tablet (20 mg total) by mouth daily at 6 PM. 30 tablet 3  . carvedilol (COREG) 25 MG tablet Take 1 tablet (25 mg total) by mouth 2 (two) times daily with a meal. 60 tablet 6  . clopidogrel (PLAVIX) 75 MG tablet Take 1 tablet (75 mg total) by mouth daily. 30 tablet 3  . furosemide (LASIX) 40 MG tablet Take 0.5 tablets (20 mg total) by mouth daily. Hold if weight is 225 lb or less 15 tablet 6  . glucose blood (FREESTYLE TEST STRIPS) test strip Use as instructed 100 each 6  . insulin aspart (NOVOLOG) 100 UNIT/ML injection Inject 0-10 Units into the skin 3 (three) times daily before meals. Sliding scale    . Insulin Degludec (TRESIBA FLEXTOUCH) 100 UNIT/ML SOPN Inject 18 Units into the skin at bedtime. 5 pen 2  . Insulin Pen Needle 32G X 6 MM MISC Use for insulin injections 4 times per day 150 each 12  . lidocaine (LIDODERM) 5 % Place 1 patch onto the skin daily. Remove & Discard patch within 12 hours or as directed by MD (Patient taking differently: Place 1 patch onto the skin as needed. Remove & Discard patch within 12 hours or as directed by MD) 30 patch 0  . lisinopril (PRINIVIL,ZESTRIL) 5 MG tablet Take 1 tablet (5 mg total) by mouth daily. 30 tablet 3  . nitroGLYCERIN (NITROSTAT) 0.4 MG SL tablet Place 1 tablet (0.4 mg total) under the tongue every 5 (five) minutes as needed for chest pain. 25 tablet 0  . patiromer (VELTASSA) 8.4 G packet Take 1 packet (8.4 g total) by mouth daily. 30 packet    No current facility-administered medications for this encounter.   BP 130/67 mmHg  Pulse 66  Wt 234 lb (106.142 kg)  SpO2 98% General: NAD Neck: JVD 7, no thyromegaly or thyroid nodule. Left carotid bruit.  Lungs: Clear   CV: Nondisplaced PMI.  Heart regular S1/S2, no S3/S4, 2/6 early SEM RUSB. 1+ ankle edema.  Normal pedal pulses.  Abdomen: Soft, nontender, no hepatosplenomegaly, no distention.  Skin: Intact without lesions or rashes.   Neurologic: Alert and oriented x 3.  Psych: Normal affect. Extremities: No clubbing or cyanosis.  HEENT: Normal.   Assessment/Plan: 1. Chronic systolic CHF: Echo 03/2015 with EF 40-45%, ischemic cardiomyopathy.  NYHA class II symptoms. No volume overload on exam today.  Back at work and getting some fatigue but had been out for almost a year prior to returning.     - Continue Lasix 20 mg daily and keep sodium in diet low.  - Controlling hyperkalemia with patiromer 8.4 g/day.  I have been able to add lisinopril 5 mg daily.  Most recent K was 5 so I will not increase today or add spironolactone.   - Continue Coreg to 25 mg bid.  2. CAD: Status post Xience DES to LAD and RCA after cardiac arrest.  Will need to follow closely due to bleeding risk on DAPT with chronic thrombocytopenia.   He is currently on both ASA 81 and Plavix as P2Y12 test has not shown robust platelet inhibition with Plavix (most recently 241 PRU).  No evidence for bleeding.  - Continue statin - Continue ASA 81 and Plavix. Stop Plavix in 10/16.  3. Cirrhosis: Likely related to ETOH, no longer drinking.  Has associated splenomegaly and chronic thrombocytopenia.  4. Thrombocytopenia: Chronic, likely due to splenomegaly. Will need to follow plts/hemoglobin over time closely.     5. DM: Per endocrinology.  6. Hyperkalemia: Tolerating ACEI so far with use of patiromer.   7. Carotid bruit: On left, carotid dopplers with mild plaque. Will need follow up in 4/18.  8. Imbalance: May be related to diabetic neuropathy, also has history of L-spine disease. Overall doing better.  9. OSA: Concern for OSA (poor sleep, fatigue).  Sleep study scheduled in 11/16.  10. Subconjunctival hemorrhage: Suspect related to low platelets and ASA/Plavix use.  Per ophthalmologist, no treatment necessary.  Marca Ancona 09/14/2015

## 2015-09-14 NOTE — Patient Instructions (Signed)
Do the following things EVERYDAY: °1) Weigh yourself in the morning before breakfast. Write it down and keep it in a log. °2) Take your medicines as prescribed °3) Eat low salt foods--Limit salt (sodium) to 2000 mg per day.  °4) Stay as active as you can everyday °5) Limit all fluids for the day to less than 2 liters °

## 2015-09-21 ENCOUNTER — Other Ambulatory Visit: Payer: Self-pay | Admitting: *Deleted

## 2015-09-21 ENCOUNTER — Telehealth (HOSPITAL_COMMUNITY): Payer: Self-pay | Admitting: *Deleted

## 2015-09-21 MED ORDER — INSULIN ASPART 100 UNIT/ML ~~LOC~~ SOLN: 0.0000 [IU] | Freq: Three times a day (TID) | SUBCUTANEOUS | Status: DC

## 2015-09-21 MED ORDER — CARVEDILOL 25 MG PO TABS: 12.5000 mg | ORAL_TABLET | Freq: Two times a day (BID) | ORAL | Status: DC

## 2015-09-21 NOTE — Telephone Encounter (Signed)
RF request for Novolog LOV: 08/25/15 Next ov: 10/29/16 Last written: unknown

## 2015-09-21 NOTE — Telephone Encounter (Signed)
Pt RN at work called concerned about mr Crail. She said that his BP was 80/40. She said that his pupils were pinpoint and non reactive. He is not dizzy or any other symptoms. He has some tunnel vision. Rechecked BP it was up to 100/70. Per DM skip this afternoon dose of carvedilol and start 12.5 mg tomorrow ( 09/22/15) BID.  Will update MAR for patient and he is to call if symptoms change or stay the same.

## 2015-09-29 ENCOUNTER — Emergency Department (HOSPITAL_BASED_OUTPATIENT_CLINIC_OR_DEPARTMENT_OTHER)
Admission: EM | Admit: 2015-09-29 | Discharge: 2015-09-29 | Disposition: A | Discharge status: Home / Self Care | Payer: Worker's Compensation | Attending: Emergency Medicine | Admitting: Emergency Medicine

## 2015-09-29 ENCOUNTER — Encounter (HOSPITAL_BASED_OUTPATIENT_CLINIC_OR_DEPARTMENT_OTHER): Payer: Self-pay | Admitting: *Deleted

## 2015-09-29 ENCOUNTER — Emergency Department (HOSPITAL_BASED_OUTPATIENT_CLINIC_OR_DEPARTMENT_OTHER): Payer: Worker's Compensation

## 2015-09-29 DIAGNOSIS — Z79899 Other long term (current) drug therapy: Secondary | ICD-10-CM | POA: Diagnosis not present

## 2015-09-29 DIAGNOSIS — Z8619 Personal history of other infectious and parasitic diseases: Secondary | ICD-10-CM | POA: Diagnosis not present

## 2015-09-29 DIAGNOSIS — Z862 Personal history of diseases of the blood and blood-forming organs and certain disorders involving the immune mechanism: Secondary | ICD-10-CM | POA: Diagnosis not present

## 2015-09-29 DIAGNOSIS — I129 Hypertensive chronic kidney disease with stage 1 through stage 4 chronic kidney disease, or unspecified chronic kidney disease: Secondary | ICD-10-CM | POA: Insufficient documentation

## 2015-09-29 DIAGNOSIS — R011 Cardiac murmur, unspecified: Secondary | ICD-10-CM | POA: Insufficient documentation

## 2015-09-29 DIAGNOSIS — S6991XA Unspecified injury of right wrist, hand and finger(s), initial encounter: Secondary | ICD-10-CM | POA: Diagnosis not present

## 2015-09-29 DIAGNOSIS — Z9889 Other specified postprocedural states: Secondary | ICD-10-CM | POA: Insufficient documentation

## 2015-09-29 DIAGNOSIS — W010XXA Fall on same level from slipping, tripping and stumbling without subsequent striking against object, initial encounter: Secondary | ICD-10-CM | POA: Insufficient documentation

## 2015-09-29 DIAGNOSIS — S8991XA Unspecified injury of right lower leg, initial encounter: Secondary | ICD-10-CM | POA: Insufficient documentation

## 2015-09-29 DIAGNOSIS — M25511 Pain in right shoulder: Secondary | ICD-10-CM

## 2015-09-29 DIAGNOSIS — N183 Chronic kidney disease, stage 3 (moderate): Secondary | ICD-10-CM | POA: Insufficient documentation

## 2015-09-29 DIAGNOSIS — Y9289 Other specified places as the place of occurrence of the external cause: Secondary | ICD-10-CM | POA: Insufficient documentation

## 2015-09-29 DIAGNOSIS — Z9861 Coronary angioplasty status: Secondary | ICD-10-CM | POA: Diagnosis not present

## 2015-09-29 DIAGNOSIS — Y9389 Activity, other specified: Secondary | ICD-10-CM | POA: Insufficient documentation

## 2015-09-29 DIAGNOSIS — Z8719 Personal history of other diseases of the digestive system: Secondary | ICD-10-CM | POA: Diagnosis not present

## 2015-09-29 DIAGNOSIS — Y990 Civilian activity done for income or pay: Secondary | ICD-10-CM | POA: Diagnosis not present

## 2015-09-29 DIAGNOSIS — Z794 Long term (current) use of insulin: Secondary | ICD-10-CM | POA: Insufficient documentation

## 2015-09-29 DIAGNOSIS — Z7902 Long term (current) use of antithrombotics/antiplatelets: Secondary | ICD-10-CM | POA: Diagnosis not present

## 2015-09-29 DIAGNOSIS — M1990 Unspecified osteoarthritis, unspecified site: Secondary | ICD-10-CM | POA: Diagnosis not present

## 2015-09-29 DIAGNOSIS — S4991XA Unspecified injury of right shoulder and upper arm, initial encounter: Secondary | ICD-10-CM | POA: Diagnosis not present

## 2015-09-29 DIAGNOSIS — Z8631 Personal history of diabetic foot ulcer: Secondary | ICD-10-CM | POA: Diagnosis not present

## 2015-09-29 DIAGNOSIS — E11319 Type 2 diabetes mellitus with unspecified diabetic retinopathy without macular edema: Secondary | ICD-10-CM | POA: Diagnosis not present

## 2015-09-29 DIAGNOSIS — M25561 Pain in right knee: Secondary | ICD-10-CM

## 2015-09-29 DIAGNOSIS — Z8701 Personal history of pneumonia (recurrent): Secondary | ICD-10-CM | POA: Diagnosis not present

## 2015-09-29 DIAGNOSIS — M79641 Pain in right hand: Secondary | ICD-10-CM

## 2015-09-29 DIAGNOSIS — Z7982 Long term (current) use of aspirin: Secondary | ICD-10-CM | POA: Insufficient documentation

## 2015-09-29 MED ORDER — OXYCODONE-ACETAMINOPHEN 5-325 MG PO TABS
1.0000 | ORAL_TABLET | ORAL | Status: DC | PRN
  Administered 2015-09-29: 1 via ORAL
  Filled 2015-09-29: qty 1

## 2015-09-29 MED ORDER — IBUPROFEN 800 MG PO TABS
800.0000 mg | ORAL_TABLET | Freq: Once | ORAL | Status: DC
  Filled 2015-09-29: qty 1

## 2015-09-29 MED ORDER — ONDANSETRON 4 MG PO TBDP
4.0000 mg | ORAL_TABLET | Freq: Once | ORAL | Status: AC
  Administered 2015-09-29: 4 mg via ORAL
  Filled 2015-09-29: qty 1

## 2015-09-29 MED ORDER — OXYCODONE HCL 5 MG PO TABS: 5.0000 mg | ORAL_TABLET | ORAL | Status: DC | PRN

## 2015-09-29 NOTE — ED Notes (Signed)
He tripped over wires at work and fell. Injury to his right hand, right knee and right scapula.

## 2015-09-29 NOTE — ED Notes (Signed)
PA at bedside.

## 2015-09-29 NOTE — ED Notes (Signed)
Patient transported to X-ray 

## 2015-09-29 NOTE — Discharge Instructions (Signed)
You have been seen today for a fall at work.  There were no abnormalities found on your imaging studies.  Followup with your PCP as needed. Return to ED should symptoms worsen.   Cryotherapy Cryotherapy means treatment with cold. Ice or gel packs can be used to reduce both pain and swelling. Ice is the most helpful within the first 24 to 48 hours after an injury or flare-up from overusing a muscle or joint. Sprains, strains, spasms, burning pain, shooting pain, and aches can all be eased with ice. Ice can also be used when recovering from surgery. Ice is effective, has very few side effects, and is safe for most people to use. PRECAUTIONS  Ice is not a safe treatment option for people with:  Raynaud phenomenon. This is a condition affecting small blood vessels in the extremities. Exposure to cold may cause your problems to return.  Cold hypersensitivity. There are many forms of cold hypersensitivity, including:  Cold urticaria. Red, itchy hives appear on the skin when the tissues begin to warm after being iced.  Cold erythema. This is a red, itchy rash caused by exposure to cold.  Cold hemoglobinuria. Red blood cells break down when the tissues begin to warm after being iced. The hemoglobin that carry oxygen are passed into the urine because they cannot combine with blood proteins fast enough.  Numbness or altered sensitivity in the area being iced. If you have any of the following conditions, do not use ice until you have discussed cryotherapy with your caregiver:  Heart conditions, such as arrhythmia, angina, or chronic heart disease.  High blood pressure.  Healing wounds or open skin in the area being iced.  Current infections.  Rheumatoid arthritis.  Poor circulation.  Diabetes. Ice slows the blood flow in the region it is applied. This is beneficial when trying to stop inflamed tissues from spreading irritating chemicals to surrounding tissues. However, if you expose your skin  to cold temperatures for too long or without the proper protection, you can damage your skin or nerves. Watch for signs of skin damage due to cold. HOME CARE INSTRUCTIONS Follow these tips to use ice and cold packs safely.  Place a dry or damp towel between the ice and skin. A damp towel will cool the skin more quickly, so you may need to shorten the time that the ice is used.  For a more rapid response, add gentle compression to the ice.  Ice for no more than 10 to 20 minutes at a time. The bonier the area you are icing, the less time it will take to get the benefits of ice.  Check your skin after 5 minutes to make sure there are no signs of a poor response to cold or skin damage.  Rest 20 minutes or more between uses.  Once your skin is numb, you can end your treatment. You can test numbness by very lightly touching your skin. The touch should be so light that you do not see the skin dimple from the pressure of your fingertip. When using ice, most people will feel these normal sensations in this order: cold, burning, aching, and numbness.  Do not use ice on someone who cannot communicate their responses to pain, such as small children or people with dementia. HOW TO MAKE AN ICE PACK Ice packs are the most common way to use ice therapy. Other methods include ice massage, ice baths, and cryosprays. Muscle creams that cause a cold, tingly feeling do not offer  the same benefits that ice offers and should not be used as a substitute unless recommended by your caregiver. To make an ice pack, do one of the following:  Place crushed ice or a bag of frozen vegetables in a sealable plastic bag. Squeeze out the excess air. Place this bag inside another plastic bag. Slide the bag into a pillowcase or place a damp towel between your skin and the bag.  Mix 3 parts water with 1 part rubbing alcohol. Freeze the mixture in a sealable plastic bag. When you remove the mixture from the freezer, it will be  slushy. Squeeze out the excess air. Place this bag inside another plastic bag. Slide the bag into a pillowcase or place a damp towel between your skin and the bag. SEEK MEDICAL CARE IF:  You develop white spots on your skin. This may give the skin a blotchy (mottled) appearance.  Your skin turns blue or pale.  Your skin becomes waxy or hard.  Your swelling gets worse. MAKE SURE YOU:   Understand these instructions.  Will watch your condition.  Will get help right away if you are not doing well or get worse.   This information is not intended to replace advice given to you by your health care provider. Make sure you discuss any questions you have with your health care provider.   Document Released: 08/01/2011 Document Revised: 12/26/2014 Document Reviewed: 08/01/2011 Elsevier Interactive Patient Education 2016 [employer]  [employer] Therapy Heat therapy can help ease sore, stiff, injured, and tight muscles and joints. Heat relaxes your muscles, which may help ease your pain.  RISKS AND COMPLICATIONS If you have any of the following conditions, do not use heat therapy unless your health care provider has approved:  Poor circulation.  Healing wounds or scarred skin in the area being treated.  Diabetes, heart disease, or high blood pressure.  Not being able to feel (numbness) the area being treated.  Unusual swelling of the area being treated.  Active infections.  Blood clots.  Cancer.  Inability to communicate pain. This may include young children and people who have problems with their brain function (dementia).  Pregnancy. Heat therapy should only be used on old, pre-existing, or long-lasting (chronic) injuries. Do not use heat therapy on new injuries unless directed by your health care provider. HOW TO USE HEAT THERAPY There are several different kinds of heat therapy, including:  Moist heat pack.  Warm water bath.  Hot water bottle.  Electric heating  pad.  Heated gel pack.  Heated wrap.  Electric heating pad. Use the heat therapy method suggested by your health care provider. Follow your health care provider's instructions on when and how to use heat therapy. GENERAL HEAT THERAPY RECOMMENDATIONS  Do not sleep while using heat therapy. Only use heat therapy while you are awake.  Your skin may turn pink while using heat therapy. Do not use heat therapy if your skin turns red.  Do not use heat therapy if you have new pain.  High heat or long exposure to heat can cause burns. Be careful when using heat therapy to avoid burning your skin.  Do not use heat therapy on areas of your skin that are already irritated, such as with a rash or sunburn. SEEK MEDICAL CARE IF:  You have blisters, redness, swelling, or numbness.  You have new pain.  Your pain is worse. MAKE SURE YOU:  Understand these instructions.  Will watch your condition.  Will get help right  away if you are not doing well or get worse.   This information is not intended to replace advice given to you by your health care provider. Make sure you discuss any questions you have with your health care provider.   Document Released: 02/27/2012 Document Revised: 12/26/2014 Document Reviewed: 01/28/2014 Elsevier Interactive Patient Education [employer]

## 2015-09-29 NOTE — ED Provider Notes (Signed)
CSN: [id number]     Arrival date & time 09/29/15  1409 History   First MD Initiated Contact with Patient 09/29/15 1459     Chief Complaint  Patient presents with  . Fall     (Consider location/radiation/quality/duration/timing/severity/associated sxs/prior Treatment) HPI   [name] is a 54 y.o. male presents with right scapula, right hand, and right knee pain following a trip and fall at work. Pt states he tripped over some wires.  Pt did not lose consciousness, did not hit his head. Denies neck/back pain, numbness, tingling, weakness, N/V, chest pain, shortness of breath or any other complaints or pain.  Pt states he has injured both his knees in the past, but never his scapula or hand.  Pt describes all areas of pain as sharp/throbbing, 10/10, and his scapular pain radiates to the right side of his chest muscles. Pt has not taken anything for the pain.  Past Medical History  Diagnosis Date  . Heart murmur     ECHO 07/2011 showed mild AS and LVH.  TEE 09/2011 showed small PFO, bicuspid aortic valve but no stenosis or regurg.  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  Gen surg and GI recommended flagyl and cipro x 2 wks..  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  . Cholelithiasis     u/s--no cholecystitis.  06/2014 u/s showed no stones, only GB sludge  . Peripheral neuropathy (HCC)     Diabetic:  Autonomic (pelvic) and LE polyneuropathy--[facility] testing showed that it is likely from DM  . Diabetic foot ulcers (HCC)     Poor healing; normal ABIs and TBIs  . Arthritis     back L3 - L5  . Hyperactive gag reflex   . History of pyelonephritis   . Type II or unspecified type diabetes mellitus with neurological manifestations, not stated as uncontrolled     IDDM: neuro, renal, and ophth complications  . Wears dentures      upper  . Osteomyelitis of toe of left foot (HCC)     left 4th and 5th toes--now s/p amputation of these areas  . Diabetic retinopathy associated with type 2 diabetes mellitus (HCC)     Laser tx in both eyes (Dr. Allyne Gee): severe nonprolif diab retpthy, diabetic macular edema (05/2015)  . Diabetic nephropathy (HCC)     Proteinuria and CrCl 55-65  . Chronic renal insufficiency, stage III (moderate)     CrCl 50s-80s 2014 to pre-VFIB arrest 10/02/14, at which time he sustained AKI.  . Lumbar spondylosis     MRI 08/25/11 with multilevel facet dz, small disc protrusion at L5-S1 to the right without impingement  . Ischemic cardiomyopathy 07/22/14    Myoview w/out ischemia but large scar (inferior/apex) w/ global hypokinesis--medical mgmt  . History of pancreatitis 2014 and 2015    Suspected biliary: passing small gallstones: surgery declined to remove GB 07/2014.  . Aspiration pneumonia (HCC)   . Thrombocytopenia (HCC)   . Portal hypertension (HCC)   . Liver cirrhosis secondary to NASH     ?+ alcohol?.  With hx of splenomegaly and ascities (dx approx 2012)  . History of gram negative sepsis 2015    e coli  . Ventricular fibrillation (HCC) 10/02/14    Resuscitated, hospitalized, 3 V CAD detected, DES stent to mid LAD and to RCA were placed.  [name]  Peripheral edema     chronic asymmetric LE edema (L>>R)   Past Surgical History  Procedure Laterality Date  . Knee arthroscopy  2001    Bilateral  . Amputation  10/24/2012    Procedure: AMPUTATION RAY;  Surgeon: Sherri Rad, MD;  Location: [facility];  Service: Orthopedics;  Laterality: Left;  left 4th toe amputation through MTP joint, 5th Ray amputation   . Knee surgery  2001  . Hida scan  06/2014    Normal  . Transthoracic echocardiogram  07/19/14;01/07/15    Mild LV dilation and hypertrophy, EF 35-40%, no wall motion abnormalities, grade II diast dysfxn, bicuspid aortic valve with mild AS and mild AI.  No change on 12/2014 echo.  .  Cardiovascular stress test  07/2014    Myoview w/out ischemia but large scar (inferior/apex) w/ global hypokinesis--medical mgmt  . Eye surgery  08/2014    Retinal hemorrhage evacuation  . Coronary angioplasty with stent placement  09/2014    DES to mid LAD and RCA; EF 40% by cardiac MRI  . Cardiac mri  09/2014    Moderately dilated LV.  EF 40%, multiple wall motion abnormalities, normal RV fxn.  Some areas of scarring.  . Left heart catheterization with coronary angiogram N/A 10/10/2014    Procedure: LEFT HEART CATHETERIZATION WITH CORONARY ANGIOGRAM;  Surgeon: Peter M Swaziland, MD;  Location: [facility];  Service: Cardiovascular;  Laterality: N/A;  . Left heart catheterization with coronary angiogram N/A 10/15/2014    Procedure: LEFT HEART CATHETERIZATION WITH CORONARY ANGIOGRAM;  Surgeon: Iran Ouch, MD;  Location: [facility] CATH LAB;  Service: Cardiovascular;  Laterality: N/A;  . Carotid dopplers  04/10/15    1-35% bilat; normal subclavians   Family History  Problem Relation Age of Onset  . Heart disease Father   . Diabetes type II Sister    Social History  Substance Use Topics  . Smoking status: Never Smoker   . Smokeless tobacco: Current User    Types: Chew     Comment: occasional 1 can per 2 weeks  . Alcohol Use: No     Comment: Quit drinking alcohol 08/20/2011    Review of Systems  Constitutional: Negative for diaphoresis.  Musculoskeletal: Positive for myalgias, joint swelling and arthralgias. Negative for back pain, gait problem, neck pain and neck stiffness.  Skin: Negative for color change and pallor.  Neurological: Negative for dizziness, tremors, syncope, weakness, light-headedness, numbness and headaches.      Allergies  Iodine; Other; Hydralazine hcl; and Naproxen  Home Medications   Prior to Admission medications   Medication Sig Start Date End Date Taking? Authorizing Provider  aspirin 81 MG chewable tablet Chew 1 tablet (81 mg total) by mouth daily. 10/18/14    Lonia Blood, MD  atorvastatin (LIPITOR) 20 MG tablet Take 1 tablet (20 mg total) by mouth daily at 6 PM. 04/20/15   [name], MD  carvedilol (COREG) 25 MG tablet Take 0.5 tablets (12.5 mg total) by mouth 2 (two) times daily. 09/21/15   Laurey Morale, MD  clopidogrel (PLAVIX) 75 MG tablet Take 1 tablet (75 mg total) by mouth daily. 05/08/15   [name], MD  furosemide (LASIX) 40 MG tablet Take 0.5 tablets (20 mg total) by mouth daily. Hold if weight is 225 lb or less 07/22/15   Laurey Morale, MD  glucose blood (FREESTYLE TEST STRIPS) test strip Use as instructed 11/05/14   [name], MD  insulin  aspart (NOVOLOG) 100 UNIT/ML injection Inject 0-10 Units into the skin 3 (three) times daily before meals. Sliding scale 09/21/15   [name], MD  Insulin Degludec (TRESIBA FLEXTOUCH) 100 UNIT/ML SOPN Inject 18 Units into the skin at bedtime. 02/19/15   Carlus Pavlov, MD  Insulin Pen Needle 32G X 6 MM MISC Use for insulin injections 4 times per day 03/05/15   [name], MD  lidocaine (LIDODERM) 5 % Place 1 patch onto the skin daily. Remove & Discard patch within 12 hours or as directed by MD Patient taking differently: Place 1 patch onto the skin as needed. Remove & Discard patch within 12 hours or as directed by MD 10/18/14   Lonia Blood, MD  lisinopril (PRINIVIL,ZESTRIL) 5 MG tablet Take 1 tablet (5 mg total) by mouth daily. 07/22/15   Laurey Morale, MD  nitroGLYCERIN (NITROSTAT) 0.4 MG SL tablet Place 1 tablet (0.4 mg total) under the tongue every 5 (five) minutes as needed for chest pain. 11/20/14   Laurey Morale, MD  oxyCODONE (ROXICODONE) 5 MG immediate release tablet Take 1 tablet (5 mg total) by mouth every 4 (four) hours as needed for severe pain. 09/29/15   Lilia Letterman C Linea Calles, PA-C  patiromer (VELTASSA) 8.4 G packet Take 1 packet (8.4 g total) by mouth daily. 06/12/15   Laurey Morale, MD   BP 143/57 mmHg  Pulse 73  Temp(Src) 98.8 F (37.1 C) (Oral)  Resp  20  Ht 5' 10.75" (1.797 m)  Wt 229 lb (103.874 kg)  BMI 32.17 kg/m2  SpO2 99% Physical Exam  Constitutional: He is oriented to person, place, and time. He appears well-developed and well-nourished. No distress.  HENT:  Head: Normocephalic and atraumatic.  Eyes: Conjunctivae are normal. Pupils are equal, round, and reactive to light.  Neck: Normal range of motion.  Cardiovascular: Normal rate, regular rhythm, normal heart sounds and intact distal pulses.   Pulmonary/Chest: Effort normal and breath sounds normal. No respiratory distress.  Abdominal: Soft. Bowel sounds are normal.  Musculoskeletal: Normal range of motion. He exhibits no edema or tenderness.  Full ROM to right knee, shoulder, and hand. Swelling and bruising noted to right knee and right hand at knuckles. No knee laxity.   Neurological: He is alert and oriented to person, place, and time.  No weakness, strength 5/5 all extremities. No sensory deficits.  Skin: Skin is warm and dry. He is not diaphoretic.  Nursing note and vitals reviewed.   ED Course  Procedures (including critical care time) Labs Review Labs Reviewed - No data to display  Imaging Review Dg Scapula Right  09/29/2015   CLINICAL DATA:  Fall  EXAM: RIGHT SCAPULA - 2+ VIEWS  COMPARISON:  None.  FINDINGS: Normal alignment no fracture.  AC degenerative change and spurring.  IMPRESSION: Negative for fracture.   Electronically Signed   By: Marlan Palau M.D.   On: 09/29/2015 15:58   Dg Knee Complete 4 Views Right  09/29/2015   CLINICAL DATA:  Fall at work, of fifth metacarpal pain, knee pain  EXAM: RIGHT KNEE - COMPLETE 4+ VIEW  COMPARISON:  04/04/2013  FINDINGS: Four views of the right knee submitted. No acute fracture or subluxation. No radiopaque foreign body. Mild spurring of patella. No joint effusion.  IMPRESSION: No acute fracture or subluxation.  Mild degenerative changes.   Electronically Signed   By: Natasha Mead M.D.   On: 09/29/2015 16:05   Dg Hand  Complete Right  09/29/2015  CLINICAL DATA:  Status post fall.  EXAM: RIGHT HAND - COMPLETE 3+ VIEW  COMPARISON:  None.  FINDINGS: There is no evidence of fracture or dislocation. There is no evidence of arthropathy or other focal bone abnormality. Soft tissues are unremarkable.  IMPRESSION: No acute osseous injury of the right.   Electronically Signed   By: Elige Ko   On: 09/29/2015 15:58   I have personally reviewed and evaluated these images and lab results as part of my medical decision-making.   EKG Interpretation None      MDM   Final diagnoses:  Shoulder pain, acute, right  Right knee pain  Right hand pain  Fall from slip, trip, or stumble, initial encounter    [name] presents with right scapula, right hand, and right knee pain following a trip and fall at work.   Findings and plan of care discussed with Geoffery Lyons, MD.  Pain management and imaging ordered. Pt has a friend at the bedside who says that he will be giving pt a ride home. Was going to give pt ibuprofen for inflammation, but pt states his Cardiologist told him not to take ibuprofen.  All xrays negative for fractures. Will discharge pt and have him followup with his PCP as needed. Pt given a sling immobilizer and knee sleeve for comfort.  Anselm Pancoast, PA-C 09/29/15 1637  Geoffery Lyons, MD 09/29/15 2151

## 2015-09-29 NOTE — ED Notes (Signed)
Directed to pharmacy to pick up prescriptions- Pt d/c with ride

## 2015-10-06 ENCOUNTER — Ambulatory Visit (HOSPITAL_COMMUNITY)
Admission: RE | Admit: 2015-10-06 | Discharge: 2015-10-06 | Disposition: A | Discharge status: Home / Self Care | Payer: 59 | Source: Ambulatory Visit | Attending: Cardiology | Admitting: Cardiology

## 2015-10-06 ENCOUNTER — Encounter (HOSPITAL_COMMUNITY): Payer: Self-pay

## 2015-10-06 VITALS — BP 160/88 | HR 60 | Wt 233.8 lb

## 2015-10-06 DIAGNOSIS — G4733 Obstructive sleep apnea (adult) (pediatric): Secondary | ICD-10-CM | POA: Diagnosis not present

## 2015-10-06 DIAGNOSIS — E119 Type 2 diabetes mellitus without complications: Secondary | ICD-10-CM | POA: Diagnosis not present

## 2015-10-06 DIAGNOSIS — D696 Thrombocytopenia, unspecified: Secondary | ICD-10-CM

## 2015-10-06 DIAGNOSIS — E875 Hyperkalemia: Secondary | ICD-10-CM | POA: Diagnosis not present

## 2015-10-06 DIAGNOSIS — I10 Essential (primary) hypertension: Secondary | ICD-10-CM

## 2015-10-06 DIAGNOSIS — I5022 Chronic systolic (congestive) heart failure: Secondary | ICD-10-CM

## 2015-10-06 DIAGNOSIS — R0989 Other specified symptoms and signs involving the circulatory and respiratory systems: Secondary | ICD-10-CM

## 2015-10-06 DIAGNOSIS — I2510 Atherosclerotic heart disease of native coronary artery without angina pectoris: Secondary | ICD-10-CM | POA: Insufficient documentation

## 2015-10-06 DIAGNOSIS — K7460 Unspecified cirrhosis of liver: Secondary | ICD-10-CM | POA: Insufficient documentation

## 2015-10-06 DIAGNOSIS — I110 Hypertensive heart disease with heart failure: Secondary | ICD-10-CM | POA: Insufficient documentation

## 2015-10-06 LAB — CBC
HCT: 36.6 % — ABNORMAL LOW (ref 39.0–52.0)
HEMOGLOBIN: 12.6 g/dL — AB (ref 13.0–17.0)
MCH: 30.8 pg (ref 26.0–34.0)
MCHC: 34.4 g/dL (ref 30.0–36.0)
MCV: 89.5 fL (ref 78.0–100.0)
PLATELETS: 71 10*3/uL — AB (ref 150–400)
RBC: 4.09 MIL/uL — AB (ref 4.22–5.81)
RDW: 12.8 % (ref 11.5–15.5)
WBC: 4.9 10*3/uL (ref 4.0–10.5)

## 2015-10-06 LAB — BASIC METABOLIC PANEL
ANION GAP: 6 (ref 5–15)
BUN: 37 mg/dL — ABNORMAL HIGH (ref 6–20)
CALCIUM: 9.1 mg/dL (ref 8.9–10.3)
CO2: 25 mmol/L (ref 22–32)
CREATININE: 1.36 mg/dL — AB (ref 0.61–1.24)
Chloride: 106 mmol/L (ref 101–111)
GFR, EST NON AFRICAN AMERICAN: 58 mL/min — AB (ref >60)
GLUCOSE: 138 mg/dL — AB (ref 65–99)
Potassium: 5 mmol/L (ref 3.5–5.1)
Sodium: 137 mmol/L (ref 135–145)

## 2015-10-06 NOTE — Patient Instructions (Addendum)
Stop Plavix  Labs today  Your physician recommends that you schedule a follow-up appointment in: 2 months

## 2015-10-06 NOTE — Progress Notes (Signed)
Patient ID: [name], male   DOB: 08/29/1961, 54 y.o.   MRN: [id number] PCP: Dr. [name] HF MD: Dr Shirlee Latch.   54 y.o. with history of CAD s/p recent PCI, cirrhosis with thrombocytopenia, cardiac arrest, and ischemic cardiomyopathy.  Patient was admitted in 7/15-8/15 with gallstone pancreatitis.  It was decided not to do a cholecystectomy due to high surgical risk.  He was sent home and went back to work.  In 10/15, he had a cardiac arrest at work and was defibrillated by AED.  He was cooled and recovered well.  Platelets were initially very low but recovered to his baseline in the 60K range.  Cardiac cath was done showing severe 2 vessel disease.  Cardiac MRI was done, showing EF 40% with a mixed viability picture.  With recovery of platelets, he had Xience DES to the LAD and the distal RCA.  He had acute on chronic systolic CHF with volume overload and was diuresed and eventually discharge.  Unfortunately, he was sent home on daily metolazone and developed AKI.  Diuretics were held and this has gradually improved.  Spironolactone and lisinopril were stopped due to hyperkalemia.  However, he was able to restart lisinopril with simultaneous use of patiromer.  K most recently was 5.  He returns today for HF follow up.  Overall doing ok but had a fall at work on Oct 11. Having LUE /R knee pain. Also has ongoing back pain. Denies SOB/PND/Orthopnea/CP.  Did not take meds prior to visit. Taking all medications. Weight at home 229 pounds.Currently on lite duty at work. Sleep study scheduled for 11/16.    Labs (11/15): K 4.7 => 7.2, creatinine 1.37 => 2.1 => 2.25 with BUN 94, HCT 26.9, plts 67, P2Y12 241 Labs (11/20/14): K 6.2, creatinine 1.1, HCT 33.2, plts 45 Labs (12/18/14): K 6.1 creatinine 1.07 off spiro and lisinopril.  Labs (1/16): K 5.2, creatinine 1.12 Labs (2/16): LDL 59, HDL 41, HCT 32.4 Labs (3/16): K 5.4, creatinine 1.27 Labs (4/16): HCT 32.9, plts 47 Labs (5/16): K 5.2, creatinine 1.23, BNP  352 Labs (6/16): hgb 11.8, plts 41  Labs (7/16): K 4.7, creatinine 1.19 Labs (8/16): K 5, creatinine 1.28, hgb 11.8, plts 41 Labs (9/16): K 5, creatinine 1.39, BNP 321   PMH: 1. 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  2. Thrombocytopenia: Chronic.  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   3. Gallstone pancreatitis: He did not have cholecystectomy as deemed too high a surgical risk.  4. CKD 5. 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 Bicuspid aortic valve: Moderate AS with mean gradient 25 mmHg by echo in 4/16.  7. H/o perforated diverticulum.  8. CAD: Cardiac arrest 10/15.  LHC (10/15) with 80-90% proximal ramus stenosis, 90% mLAD, subtotaled distal LAD, 90% D1, 90% dRCA/proximal PDA.  Patient had Xience DES to mLAD, dRCA.  9. Cardiac arrest: 10/15, defibrillated with AED and cooled.  10. Ischemic cardiomyopathy: Echo (8/15) with EF 35-40%, bicuspid aortic valve with mild AS/AI.  Cardiac MRI (10/15) with EF 40%, regional wall motion abnormalities, normal RV, delayed enhancement pattern suggesting mixed picture of viability. Echo (1/16) with EF 35-40%, hypokinesis of the mid to apical septum and the true apex, bicuspid aortic valve, mild AI.  Echo (4/16) with EF 40-45%, bicuspid aortic valve with moderate AS (mean gradient 25 mmHg) 11. Carotid dopplers (4/16): Mild plaque 12. Lumbar spine degenerative disc disease.   SH: Never smoked.  Prior ETOH abuse, now stopped.  Works at [employer] and [employer].  Lives with sister in [place].   FH: No premature CAD.   ROS: All systems reviewed and negative except as per HPI.   Current Outpatient Prescriptions  Medication Sig Dispense Refill  . aspirin 81 MG chewable tablet Chew 1 tablet (81 mg total) by mouth daily.    [name] atorvastatin (LIPITOR) 20 MG tablet Take 1 tablet (20 mg total) by mouth daily at 6 PM. 30 tablet 3  . carvedilol (COREG) 25 MG tablet Take 0.5 tablets (12.5 mg total) by  mouth 2 (two) times daily. 90 tablet 3  . clopidogrel (PLAVIX) 75 MG tablet Take 1 tablet (75 mg total) by mouth daily. 30 tablet 3  . furosemide (LASIX) 40 MG tablet Take 0.5 tablets (20 mg total) by mouth daily. Hold if weight is 225 lb or less 15 tablet 6  . glucose blood (FREESTYLE TEST STRIPS) test strip Use as instructed 100 each 6  . insulin aspart (NOVOLOG) 100 UNIT/ML injection Inject 0-10 Units into the skin 3 (three) times daily before meals. Sliding scale 10 mL 6  . Insulin Degludec (TRESIBA FLEXTOUCH) 100 UNIT/ML SOPN Inject 18 Units into the skin at bedtime. 5 pen 2  . Insulin Pen Needle 32G X 6 MM MISC Use for insulin injections 4 times per day 150 each 12  . lidocaine (LIDODERM) 5 % Place 1 patch onto the skin daily. Remove & Discard patch within 12 hours or as directed by MD (Patient taking differently: Place 1 patch onto the skin as needed. Remove & Discard patch within 12 hours or as directed by MD) 30 patch 0  . lisinopril (PRINIVIL,ZESTRIL) 5 MG tablet Take 1 tablet (5 mg total) by mouth daily. 30 tablet 3  . nitroGLYCERIN (NITROSTAT) 0.4 MG SL tablet Place 1 tablet (0.4 mg total) under the tongue every 5 (five) minutes as needed for chest pain. 25 tablet 0  . oxyCODONE (ROXICODONE) 5 MG immediate release tablet Take 1 tablet (5 mg total) by mouth every 4 (four) hours as needed for severe pain. 5 tablet 0  . patiromer (VELTASSA) 8.4 G packet Take 1 packet (8.4 g total) by mouth daily. 30 packet    No current facility-administered medications for this encounter.   BP 160/88 mmHg  Pulse 60  Wt 233 lb 12.8 oz (106.051 kg)  SpO2 99% General: NAD Neck: JVD 5-6 , no thyromegaly or thyroid nodule. Left carotid bruit.  Lungs: Clear   CV: Nondisplaced PMI.  Heart regular S1/S2, no S3/S4, 2/6 early SEM RUSB. No edema. Normal pedal pulses.  Abdomen: Soft, nontender, no hepatosplenomegaly, no distention.  Skin: Intact without lesions or rashes.  Neurologic: Alert and oriented x 3.   Psych: Normal affect. Extremities: No clubbing or cyanosis.  HEENT: Normal.   Assessment/Plan: 1. Chronic systolic CHF: Echo 03/2015 with EF 40-45%, ischemic cardiomyopathy.  NYHA class II symptoms. Working but on [employer] duty due to fall at work on 09/29/2015.  - Continue Lasix 20 mg daily and keep sodium in diet low.  - Controlling hyperkalemia with patiromer 8.4 g/day.  - Continue lisinopril 5 mg daily.  Todays potassium is 5 so I will not increase or add spiro.  - Continue Coreg to 25 mg bid.  2. CAD: Status post Xience DES to LAD and RCA after cardiac arrest.  Will need to follow closely due to bleeding risk on DAPT with chronic thrombocytopenia.   He is currently on both ASA 81 and Plavix as P2Y12 test  has not shown robust platelet inhibition with Plavix (most recently 241 PRU).  No evidence for bleeding.  - Continue statin - Continue ASA 81 and Plavix. Per Dr Shirlee Latch stop Plavix. Completed 1 year of plavix.   3. Cirrhosis: Likely related to ETOH, no longer drinking.  Has associated splenomegaly and chronic thrombocytopenia. Platelets today 71.  4. Thrombocytopenia: Chronic, likely due to splenomegaly. Will need to follow plts/hemoglobin over time closely.     5. DM: Per endocrinology.  6. Hyperkalemia: Tolerating ACEI so far with use of patiromer.  K today 5 7. Carotid bruit: On left, carotid dopplers with mild plaque. Will need follow up in 4/18.  8. Imbalance: May be related to diabetic neuropathy, also has history of L-spine disease. Overall doing better.  9. OSA: Concern for OSA (poor sleep, fatigue).  Sleep study scheduled in 11/16.  10. Subconjunctival hemorrhage: Resolved.  11. HTN- Elevated but not surprising as he has not had his medications. I will not adjust meds for this reason.  Reviewed lab work today. Renal function stable.  Follow up in 3 months   Amy Clegg NP-C  10/06/2015

## 2015-10-07 ENCOUNTER — Ambulatory Visit (INDEPENDENT_AMBULATORY_CARE_PROVIDER_SITE_OTHER): Payer: 59 | Admitting: Neurology

## 2015-10-07 ENCOUNTER — Encounter: Payer: Self-pay | Admitting: Neurology

## 2015-10-07 ENCOUNTER — Other Ambulatory Visit (INDEPENDENT_AMBULATORY_CARE_PROVIDER_SITE_OTHER): Payer: 59

## 2015-10-07 VITALS — BP 150/82 | HR 66 | Ht 70.0 in | Wt 232.0 lb

## 2015-10-07 DIAGNOSIS — R29898 Other symptoms and signs involving the musculoskeletal system: Secondary | ICD-10-CM | POA: Diagnosis not present

## 2015-10-07 DIAGNOSIS — E1142 Type 2 diabetes mellitus with diabetic polyneuropathy: Secondary | ICD-10-CM | POA: Diagnosis not present

## 2015-10-07 DIAGNOSIS — M545 Low back pain, unspecified: Secondary | ICD-10-CM

## 2015-10-07 DIAGNOSIS — IMO0001 Reserved for inherently not codable concepts without codable children: Secondary | ICD-10-CM

## 2015-10-07 DIAGNOSIS — E119 Type 2 diabetes mellitus without complications: Secondary | ICD-10-CM

## 2015-10-07 DIAGNOSIS — Z794 Long term (current) use of insulin: Secondary | ICD-10-CM

## 2015-10-07 DIAGNOSIS — R2681 Unsteadiness on feet: Secondary | ICD-10-CM

## 2015-10-07 LAB — VITAMIN B12: Vitamin B-12: 377 pg/mL (ref 211–911)

## 2015-10-07 NOTE — Patient Instructions (Addendum)
1.  MRI lumbar spine  2.  Check blood work 3.  I do recommend that you continue light duties at work. 4.  You are high fall risk, so please consider fall precautions as we discussed and listed below 5.  If you would like to return to physical therapy for balance training, please call my office so we can coordinate 6.  Return to clinic in 3 months   [facility]  Preventing Falls in the Home   Falls are common, often dreaded events in the lives of older people. Aside from the obvious injuries and even death that may result, falls can cause wide-ranging consequences including loss of independence, mental decline, decreased activity, and mobility. Younger people are also at risk of falling, especially those with chronic illnesses and fatigue.  Ways to reduce the risk for falling:  * Examine diet and medications. Warm foods and alcohol dilate blood vessels, which can lead to dizziness when standing. Sleep aids, antidepressants, and pain medications can also increase the likelihood of a fall.  * Get a vison exam. Poor vision, cataracts, and glaucoma increase the chances of falling.  * Check foot gear. Shoes should fit snugly and have a sturdy, nonskid sole and broad, low heel.  * Participate in a physician-approved exercise program to build and maintain muscle strength and improve balance and coordination.  * Increase vitamin D intake. Vitamin D improves muscle strength and increases the amount of calcium the body is able to absorb and deposit in bones.  How to prevent falls from common hazards:  * Floors - Remove all loose wires, cords, and throw rugs. Minimize clutter. Make sure rugs are anchored and smooth. Keep furniture in its usual place.  * Chairs - Use chairs with straight backs, armrests, and firm seats. Add firm cushions to existing pieces to add height.  * Bathroom - Install grab bars and non-skid tape in the tub or shower. Use a bathtub transfer bench or a shower chair with a back  support. Use an elevated toilet seat and/or safety rails to assist standing from a low surface. Do not use towel racks or bathroom tissue holders to help you stand.  * Lighting - Make sure halls, stairways, and entrances are well-lit. Install a night light in your bathroom or hallway. Make sure there is a light switch at the top and bottom of the staircase. Turn lights on if you get up in the middle of the night. Make sure lamps or light switches are within reach of the bed if you have to get up during the night.  * Kitchen - Install non-skid rubber mats near the sink and stove. Clean spills immediately. Store frequently used utensils, pots, and pans between waist and eye level. This helps prevent reaching and bending. Sit when getting things out of the lower cupboards.  * Living room / Bedrooms - Place furniture with wide spaces in between, giving enough room to move around. Establish a route through the living room that gives you something to hold onto as you walk.  * Stairs - Make sure treads, rails, and rugs are secure. Install a rail on both sides of the stairs. If stairs are a threat, it might be helpful to arrange most of your activities on the lower level to reduce the number of times you must climb the stairs.  * Entrances and doorways - Install metal handles on the walls adjacent to the doorknobs of all doors to make it more secure as you  travel through the doorway.  Tips for maintaining balance:  * Keep at least one hand free at all times Try using a backpack or fanny pack to hold things rather than carrying them in your hands. Never carry objects in both hands when walking as this interferes with keeping your balance.  * Attempt to swing both arms from front to back while walking. This might require a conscious effort if Parkinson's disease has diminished your movement. It will, however, help you to maintain balance and posture, and reduce fatigue.  * Consciously lift your feet off the ground  when walking. Shuffling and dragging of the feet is a common culprit in losing your balance.  * When trying to navigate turns, use a "U" technique of facing forward and making a wide turn, rather than pivoting sharply.  * Try to stand with your feet shoulder-length apart. When your feet are close together for any length of time, you increase your risk of losing your balance and falling.  * Do one thing at a time. Do not try to walk and accomplish another task, such as reading or looking around. The decrease in your automatic reflexes complicates motor function, so the less distraction, the better.  * Do not wear rubber or gripping soled shoes, they might "catch" on the floor and cause tripping.  * Move slowly when changing positions. Use deliberate, concentrated movements and, if needed, use a grab bar or walking aid. Count fifteen (15) seconds after standing to begin walking.  * If balance is a continuous problem, you might want to consider a walking aid such as a cane, walking stick, or walker. Once you have mastered walking with help, you may be ready to try it again on your own.  This information is provided by [facility] and is not intended to replace the medical advice of your physician or other health care providers. Please consult your physician or other health care providers for advice regarding your specific medical condition.

## 2015-10-07 NOTE — Progress Notes (Signed)
[facility] Clinic Note - Initial Visit   Date: 10/07/2015  [name] MRN: [id number] DOB: March 25, 1961   Dear Dr. Shirlee Latch:   Thank you for your kind referral of [name] for consultation of gait instability. Although his history is well known to you, please allow [place] to reiterate it for the purpose of our medical record. The patient was accompanied to the clinic by self.  History of Present Illness: [name] is a 54 y.o. 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    He has a complex neurological history, which started in May 2012 when slipped and fell and developed shooting pain in his back, but did not experience any weakness, numbness, or gait problems.  In September 2012, he was asked to be seen by Dr. Modesto Charon in our practice in 2012 for acute onset of urinary retention and bowel incontinence, concerning for cauda equina syndrome.  He underwent emergent MRI lumbar spine which showed multilevel degenerative changes, without evidence of nerve impingement.  This was ultimately found to be due to urosepsis for which he was hospitalized and treated with IV antibiotics.     Around the same time, he began experiencing numbness and weakness of his legs bilaterally, but much worse on the left side. With the relative subacute onset of symptoms and concern for demyelinating neuropathy, he underwent CSF testing which was reportedly normal.  He was then referred to [facility] where EMG showed severe sensorimotor polyneuropathy.  Of note, there was left hip flexion weakness and asymmetric sensory findings during his neurological evaluation in 2013.   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  He has chronic left ankle instability  and has been wearing an ankle brace which helps.  He has fallen a few times, mostly because of tripping on objects and loosing his balance.  He walks unassisted and lives in a one-story home with his sister and handicapped brother.    He works for [employer] at [employer] and had a fall last week after tripping on a hose.  He is seeing a Personnel officer and is on light duty, lifting no more than 10lb. 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 He is rightly concerned about work safety.  He endorses low back pain. He completed physical therapy several weeks ago, which helped his strength.  He has been complaint with home exercises.    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   Overall, he does not seem to notice any new worsening of his numbness or weakness, but that symptoms have not improved despite PT and getting his blood sugars under better control. He denies any upper extremity numbness/tingling or weakness. His balance remains primary issue, especially now that he is recovering from a cardiac standpoint, and is keen on optimizing his general health.  Out-side paper records, electronic medical record, and images have been reviewed where available and summarized as:  MRI lumbar spine 08/25/2011: 1. No evidence of compression of the cauda equina. 2. Multilevel degenerative facet joint disease. 3. Small disc protrusion at L5-S1 to the right without significant Impingement.  CSF 09/14/2011:   R0 W0 - protein and glucose results not found,  ACE 2, VDRL NR, no OCB Labs 08/2011:  SPEP with IFE no M protein, RF < 10,   Lab Results  Component Value Date   HGBA1C 6.4 05/22/2015   Lab Results  Component Value Date   TSH 1.900 07/19/2014   Lab Results  Component Value Date   VITAMINB12 310 04/16/2014    Past Medical History  Diagnosis Date  . Heart murmur     ECHO 07/2011 showed mild  AS and LVH.  TEE 09/2011 showed small PFO, bicuspid aortic valve but no stenosis or regurg.  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  Gen surg and GI recommended flagyl and cipro x 2 wks..  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  . Cholelithiasis     u/s--no cholecystitis.  06/2014 u/s showed no stones, only GB sludge  . Peripheral neuropathy (HCC)     Diabetic:  Autonomic (pelvic) and LE polyneuropathy--WFUB testing showed that it is likely from DM  . Diabetic foot ulcers (HCC)     Poor healing; normal ABIs and TBIs  . Arthritis     back L3 - L5  . Hyperactive gag reflex   . History of pyelonephritis   . Type II or unspecified type diabetes mellitus with neurological manifestations, not stated as uncontrolled     IDDM: neuro, renal, and ophth complications  . Wears dentures     upper  . Osteomyelitis of toe of left foot (HCC)     left 4th and 5th toes--now s/p amputation of these areas  . Diabetic retinopathy associated with type 2 diabetes mellitus (HCC)     Laser tx in both eyes (Dr. Allyne Gee): severe nonprolif diab retpthy, diabetic macular edema (05/2015)  . Diabetic nephropathy (HCC)     Proteinuria and CrCl 55-65  . Chronic renal insufficiency, stage III (moderate)     CrCl 50s-80s 2014 to pre-VFIB arrest 10/02/14, at which time he sustained AKI.  . Lumbar spondylosis     MRI 08/25/11 with multilevel facet dz, small disc protrusion at L5-S1 to the right without impingement  . Ischemic cardiomyopathy 07/22/14    Myoview w/out ischemia but large scar (inferior/apex) w/ global hypokinesis--medical mgmt  . History of pancreatitis 2014 and 2015    Suspected biliary: passing small gallstones: surgery declined to remove GB 07/2014.  . Aspiration pneumonia (HCC)   . Thrombocytopenia (HCC)   . Portal hypertension (HCC)     . Liver cirrhosis secondary to NASH     ?+ alcohol?.  With hx of splenomegaly and ascities (dx approx 2012)  . History of gram negative sepsis 2015    e coli  . Ventricular fibrillation (HCC) 10/02/14    Resuscitated, hospitalized, 3 V CAD detected, DES stent to mid LAD and to RCA were placed.  . Peripheral edema     chronic asymmetric LE edema (L>>R)    Past Surgical History  Procedure Laterality Date  . Knee arthroscopy  2001    Bilateral  . Amputation  10/24/2012    Procedure: AMPUTATION RAY;  Surgeon: Sherri Rad, MD;  Location: [facility];  Service: Orthopedics;  Laterality: Left;  left 4th toe amputation through MTP joint, 5th Ray amputation   . Knee surgery  2001  . Hida scan  06/2014    Normal  . Transthoracic echocardiogram  07/19/14;01/07/15  Mild LV dilation and hypertrophy, EF 35-40%, no wall motion abnormalities, grade II diast dysfxn, bicuspid aortic valve with mild AS and mild AI.  No change on 12/2014 echo.  . Cardiovascular stress test  07/2014    Myoview w/out ischemia but large scar (inferior/apex) w/ global hypokinesis--medical mgmt  . Eye surgery  08/2014    Retinal hemorrhage evacuation  . Coronary angioplasty with stent placement  09/2014    DES to mid LAD and RCA; EF 40% by cardiac MRI  . Cardiac mri  09/2014    Moderately dilated LV.  EF 40%, multiple wall motion abnormalities, normal RV fxn.  Some areas of scarring.  . Left heart catheterization with coronary angiogram N/A 10/10/2014    Procedure: LEFT HEART CATHETERIZATION WITH CORONARY ANGIOGRAM;  Surgeon: Peter M Swaziland, MD;  Location: [facility];  Service: Cardiovascular;  Laterality: N/A;  . Left heart catheterization with coronary angiogram N/A 10/15/2014    Procedure: LEFT HEART CATHETERIZATION WITH CORONARY ANGIOGRAM;  Surgeon: Iran Ouch, MD;  Location: [facility] CATH LAB;  Service: Cardiovascular;  Laterality: N/A;  . Carotid dopplers  04/10/15    1-35% bilat; normal subclavians      Medications:  Outpatient Encounter Prescriptions as of 10/07/2015  Medication Sig  . aspirin 81 MG chewable tablet Chew 1 tablet (81 mg total) by mouth daily.  [name] atorvastatin (LIPITOR) 20 MG tablet Take 1 tablet (20 mg total) by mouth daily at 6 PM.  . carvedilol (COREG) 25 MG tablet Take 0.5 tablets (12.5 mg total) by mouth 2 (two) times daily.  . furosemide (LASIX) 40 MG tablet Take 0.5 tablets (20 mg total) by mouth daily. Hold if weight is 225 lb or less  . insulin aspart (NOVOLOG) 100 UNIT/ML injection Inject 0-10 Units into the skin 3 (three) times daily before meals. Sliding scale  . Insulin Degludec (TRESIBA FLEXTOUCH) 100 UNIT/ML SOPN Inject 18 Units into the skin at bedtime.  [name] lisinopril (PRINIVIL,ZESTRIL) 5 MG tablet Take 1 tablet (5 mg total) by mouth daily.  . patiromer (VELTASSA) 8.4 G packet Take 1 packet (8.4 g total) by mouth daily.  [name] glucose blood (FREESTYLE TEST STRIPS) test strip Use as instructed (Patient not taking: Reported on 10/07/2015)  . Insulin Pen Needle 32G X 6 MM MISC Use for insulin injections 4 times per day (Patient not taking: Reported on 10/07/2015)  . lidocaine (LIDODERM) 5 % Place 1 patch onto the skin daily. Remove & Discard patch within 12 hours or as directed by MD (Patient not taking: Reported on 10/07/2015)  . nitroGLYCERIN (NITROSTAT) 0.4 MG SL tablet Place 1 tablet (0.4 mg total) under the tongue every 5 (five) minutes as needed for chest pain. (Patient not taking: Reported on 10/07/2015)  . [DISCONTINUED] oxyCODONE (ROXICODONE) 5 MG immediate release tablet Take 1 tablet (5 mg total) by mouth every 4 (four) hours as needed for severe pain.   No facility-administered encounter medications on file as of 10/07/2015.     Allergies:  Allergies  Allergen Reactions  . Iodine Other (See Comments)    Family history of anaphylaxis, unsure if patient has reaction  . Other Shortness Of Breath, Itching and Other (See Comments)    PERFUMES -  SNEEZING  . Hydralazine Hcl Hives  . Naproxen Nausea And Vomiting    Family History: Family History  Problem Relation Age of Onset  . Heart disease Father     Deceased  . Diabetes type II Sister   . Other Mother  Septicemia    Social History: Social History  Substance Use Topics  . Smoking status: Never Smoker   . Smokeless tobacco: Current User    Types: Chew     Comment: occasional 1 can per 2 weeks  . Alcohol Use: No     Comment: Quit drinking alcohol 08/20/2011   Social History   Social History Narrative   Single, no children.  Lives in [place], relocated from [place], [place] in 2008.   Chews tobacco but does not smoke.   Drinks 12 Beers daily---patient did not initially report/admit this--as of 2013 he has quit this.   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   Lives at home with his sister and handicapped brother.     Review of Systems:  CONSTITUTIONAL: No fevers, chills, night sweats, or weight loss.   EYES: No visual changes or eye pain ENT: No hearing changes.  No history of nose bleeds.   RESPIRATORY: No cough, wheezing and shortness of breath.   CARDIOVASCULAR: Negative for chest pain, and palpitations.   GI: Negative for abdominal discomfort, blood in stools or black stools.  No recent change in bowel habits.   GU:  No history of incontinence.   MUSCLOSKELETAL: +history of joint pain or swelling.  No myalgias.   SKIN: Negative for lesions, rash, and itching.   HEMATOLOGY/ONCOLOGY: Negative for prolonged bleeding, +bruising easily, and swollen nodes.  No history of cancer.   ENDOCRINE: Negative for cold or heat intolerance, polydipsia or goiter.   PSYCH:  No depression or anxiety symptoms.   NEURO: As Above.   Vital Signs:  BP 150/82 mmHg  Pulse 66  Ht 5\' 10"  (1.778 m)  Wt 232 lb (105.235 kg)  BMI 33.29 kg/m2   General Medical Exam:   General:  Well appearing, comfortable.   Eyes/ENT: see cranial nerve examination.    Neck: No masses appreciated.  Full range of motion without tenderness.  No carotid bruits. Respiratory:  Clear to auscultation, good air entry bilaterally.   Cardiac:  Systolic heart murmur, regular rate and rhythm Extremities:  S/p left 4th and 5th digit amputation, left upper arm ecchymosis.  Skin:  Mild venostasis dermatitis  Neurological Exam: MENTAL STATUS including orientation to time, place, person, recent and remote memory, attention span and concentration, language, and fund of knowledge is normal.  Speech is not dysarthric.  CRANIAL NERVES: II:  No visual field defects.  Fundoscopic exam is limited due to small pupils   III-IV-VI:  Bilateral surgical pupils, reactive to light.  Normal conjugate, extra-ocular eye movements in all directions of gaze.  No nystagmus.  No ptosis.   V:  Normal facial sensation.     VII:  Normal facial symmetry and movements.  No pathologic facial reflexes.  VIII:  Normal hearing and vestibular function.   IX-X:  Normal palatal movement.   XI:  Normal shoulder shrug and head rotation.   XII:  Normal tongue strength and range of motion, no deviation or fasciculation.  MOTOR:  Mild distal hand and leg atrophy.  No fasciculations or abnormal movements.  No pronator drift.  Tone is normal.    Right Upper Extremity:    Left Upper Extremity:    Deltoid  5/5   Deltoid  5/5   Biceps  5/5   Biceps  5/5   Triceps  5/5   Triceps  5/5   Wrist extensors  5/5   Wrist extensors  5/5   Wrist  flexors  5/5   Wrist flexors  5/5   Finger extensors  5/5   Finger extensors  5/5   Finger flexors  5/5   Finger flexors  5/5   Dorsal interossei  5-/5   Dorsal interossei  5-/5   Abductor pollicis  5/5   Abductor pollicis  5/5   Tone (Ashworth scale)  0  Tone (Ashworth scale)  0   Right Lower Extremity:    Left Lower Extremity:    Hip flexors  5/5   Hip flexors  5-/5   Hip extensors  5/5   Hip extensors  5/5   Knee flexors  5/5   Knee flexors  5/5   Knee extensors   5/5   Knee extensors  5/5   Dorsiflexors  5/5   Dorsiflexors  5/5   Plantarflexors  5/5   Plantarflexors  5/5   Toe extensors  5-/5   Toe extensors  5-/5   Toe flexors  5-/5   Toe flexors  5-/5   Tone (Ashworth scale)  0  Tone (Ashworth scale)  0   MSRs:  Right                                                                 Left brachioradialis 2+  brachioradialis 2+  biceps 2+  biceps 2+  triceps 2+  triceps 2+  patellar 2+  patellar 2+  ankle jerk 0  ankle jerk 0  Hoffman no  Hoffman no  plantar response down  plantar response down   SENSORY: Reduced sensation to all modalities below the knees bilaterally, worse on the left.  Proprioception is intact on the right.  Sensation intact in the upper extremities.  There is moderate sway with Rhomberg testing.  COORDINATION/GAIT: Normal finger-to- nose-finger and heel-to-shin.  Intact rapid alternating movements bilaterally.  Able to rise from a chair without using arms.  Gait slightly wide-based, but stable.  There is mild unsteadiness with turning but he still turns < 3 steps.  He is unable to perform stressed or tandem gait.   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   There does not seem to be a marked change in his paresthesias over the past several years, except that his balance is becoming a greater problem which is due to loss of proprioception as well as imbalance caused by ankle instability.  Unfortunately, he has already completed physical therapy which has helped his strength greatly, but no significant improvement of his balance.  His diabetes is well controlled and he has been sober since 2012. As neuropathy tends to be slowly progressive, we are most likely seeing progression of the disease.  With his new low back pain and asymmetrical findings, it would be prudent to be sure there is no lumbar canal stenosis, especially as he has retained patella reflexes, which I would expect to be  depressed with the severity of his neuropathy.  Additionally, I would like to screen for other treatable causes of neuropathy.  Management is supportive.  I offered to restart PT for gait training, but he declined.  He is not very bothered by the painful paresthesias of the soles of the feet and is not interested in medication.  I discussed fall  precautions at length and provided literature for him to review.  Regarding his work restriction, I do recommend light duty/sedentary work because he is high fall risk.     PLAN/RECOMMENDATIONS:  1.  Check vitamin B12, vitamin B1, vitamin B6, copper, zinc 2.  MRI lumbar spine wo contrast, given no improvement with PT 3.  Fall precautions reviewed, I do not feel that cane is indicated at this time, but I strongly encouraged him to make positional changes slowly 4.  Encouraged him to continue home exercises.    Return to clinic in 3 months.   The duration of this appointment visit was 50 minutes of face-to-face time with the patient.  Greater than 50% of this time was spent in counseling, explanation of diagnosis, planning of further management, and coordination of care.   Thank you for allowing me to participate in patient's care.  If I can answer any additional questions, I would be pleased to do so.    Sincerely,    Remee Charley K. Allena Katz, DO

## 2015-10-10 LAB — VITAMIN B6: VITAMIN B6: 4.6 ng/mL (ref 2.1–21.7)

## 2015-10-10 LAB — COPPER, SERUM: COPPER: 109 ug/dL (ref 70–175)

## 2015-10-10 LAB — ZINC: ZINC: 64 ug/dL (ref 60–130)

## 2015-10-12 LAB — VITAMIN B1: VITAMIN B1 (THIAMINE): 9 nmol/L (ref 8–30)

## 2015-10-14 ENCOUNTER — Encounter: Payer: Self-pay | Admitting: Family Medicine

## 2015-10-21 ENCOUNTER — Ambulatory Visit
Admission: RE | Admit: 2015-10-21 | Discharge: 2015-10-21 | Disposition: A | Discharge status: Home / Self Care | Payer: 59 | Source: Ambulatory Visit | Attending: Neurology | Admitting: Neurology

## 2015-10-21 DIAGNOSIS — R29898 Other symptoms and signs involving the musculoskeletal system: Secondary | ICD-10-CM

## 2015-10-21 DIAGNOSIS — R2681 Unsteadiness on feet: Secondary | ICD-10-CM

## 2015-10-21 DIAGNOSIS — M545 Low back pain, unspecified: Secondary | ICD-10-CM

## 2015-10-21 DIAGNOSIS — E1142 Type 2 diabetes mellitus with diabetic polyneuropathy: Secondary | ICD-10-CM

## 2015-10-22 ENCOUNTER — Telehealth: Payer: Self-pay | Admitting: Neurology

## 2015-10-22 NOTE — Telephone Encounter (Signed)
Results of MRI lumbar spine discussed which shows mild multilevel disc bulge and mild mass effect on the RIGHT S1 nerve root, which would not explain LEFT leg weakness.  Therefore, his asymmetric leg weakness is most likely progression of neuropathy as this has been stable for a number of years.   [name] K. Allena Katz, DO

## 2015-10-24 ENCOUNTER — Other Ambulatory Visit (HOSPITAL_COMMUNITY): Payer: Self-pay | Admitting: Internal Medicine

## 2015-11-08 ENCOUNTER — Other Ambulatory Visit (HOSPITAL_COMMUNITY): Payer: Self-pay | Admitting: Internal Medicine

## 2015-11-08 ENCOUNTER — Ambulatory Visit (HOSPITAL_BASED_OUTPATIENT_CLINIC_OR_DEPARTMENT_OTHER): Payer: 59 | Attending: Cardiology

## 2015-11-08 VITALS — Ht 70.0 in | Wt 234.0 lb

## 2015-11-08 DIAGNOSIS — Z6841 Body Mass Index (BMI) 40.0 and over, adult: Secondary | ICD-10-CM | POA: Diagnosis not present

## 2015-11-08 DIAGNOSIS — R0683 Snoring: Secondary | ICD-10-CM | POA: Insufficient documentation

## 2015-11-08 DIAGNOSIS — Z794 Long term (current) use of insulin: Secondary | ICD-10-CM | POA: Insufficient documentation

## 2015-11-08 DIAGNOSIS — E119 Type 2 diabetes mellitus without complications: Secondary | ICD-10-CM | POA: Diagnosis not present

## 2015-11-08 DIAGNOSIS — E669 Obesity, unspecified: Secondary | ICD-10-CM | POA: Diagnosis not present

## 2015-11-08 DIAGNOSIS — Z79899 Other long term (current) drug therapy: Secondary | ICD-10-CM | POA: Insufficient documentation

## 2015-11-08 DIAGNOSIS — G4730 Sleep apnea, unspecified: Secondary | ICD-10-CM | POA: Diagnosis present

## 2015-11-08 DIAGNOSIS — G4736 Sleep related hypoventilation in conditions classified elsewhere: Secondary | ICD-10-CM | POA: Insufficient documentation

## 2015-11-08 DIAGNOSIS — G4733 Obstructive sleep apnea (adult) (pediatric): Secondary | ICD-10-CM | POA: Diagnosis not present

## 2015-11-08 DIAGNOSIS — E1159 Type 2 diabetes mellitus with other circulatory complications: Secondary | ICD-10-CM | POA: Diagnosis not present

## 2015-11-09 ENCOUNTER — Other Ambulatory Visit: Payer: Self-pay | Admitting: *Deleted

## 2015-11-09 MED ORDER — ATORVASTATIN CALCIUM 20 MG PO TABS: 20.0000 mg | ORAL_TABLET | Freq: Every day | ORAL | Status: DC

## 2015-11-09 NOTE — Telephone Encounter (Signed)
RF request for atorvastatin LOV: 08/25/15 Next ov: 12/30/15 Last written: 04/20/15 #30 w/ 3RF

## 2015-11-10 ENCOUNTER — Telehealth: Payer: Self-pay

## 2015-11-10 NOTE — Telephone Encounter (Signed)
Left message for patient that he DOES NOT need to come to 0915 OV with Dr. Mayford Knife tomorrow because follow-up appointment will be scheduled pending sleep study results.

## 2015-11-11 ENCOUNTER — Ambulatory Visit: Payer: 59 | Admitting: Cardiology

## 2015-11-11 NOTE — Telephone Encounter (Signed)
Confirmed with patient he does not need to come to today's OV. Informed him he will be notified of his sleep study results soon and instructions for future appointments will be given to him at that time.

## 2015-11-15 ENCOUNTER — Telehealth: Payer: Self-pay | Admitting: Cardiology

## 2015-11-15 ENCOUNTER — Encounter (HOSPITAL_BASED_OUTPATIENT_CLINIC_OR_DEPARTMENT_OTHER): Payer: Self-pay

## 2015-11-15 DIAGNOSIS — G4733 Obstructive sleep apnea (adult) (pediatric): Secondary | ICD-10-CM | POA: Insufficient documentation

## 2015-11-15 HISTORY — DX: Obstructive sleep apnea (adult) (pediatric): G47.33

## 2015-11-15 NOTE — Sleep Study (Signed)
   Patient Name: [name], [name] MRN: [id number] Study Date: 11/08/2015 Gender: Male D.O.B: [birth year] Age (years): 76 Referring Provider: Marca Ancona Interpreting Physician: Armanda Magic MD, ABSM RPSGT: Cherylann Parr Weight (lbs): 234 BMI: 34Height (inches): 70 Neck Size: 18.00  CLINICAL INFORMATION Sleep Study Type: NPSG Indication for sleep study: Diabetes, Obesity, Snoring, Witnessed Apneas Epworth Sleepiness Score: 11  SLEEP STUDY TECHNIQUE As per the AASM Manual for the Scoring of Sleep and Associated Events v2.3 (April 2016) with a hypopnea requiring 4% desaturations. The channels recorded and monitored were frontal, central and occipital EEG, electrooculogram (EOG), submentalis EMG (chin), nasal and oral airflow, thoracic and abdominal wall motion, anterior tibialis EMG, snore microphone, electrocardiogram, and pulse oximetry.  MEDICATIONS Patient's medications include: Asa, Atorvastatin, Coreg, Lasix, Insulin, Lisinopril. Medications self-administered by patient during sleep study : No sleep medicine administered.  SLEEP ARCHITECTURE The study was initiated at 10:02:50 PM and ended at 4:58:20 AM. Sleep onset time was 21.2 minutes and the sleep efficiency was 89.0%. The total sleep time was 370.0 minutes. Stage REM latency was 103.5 minutes. The patient spent 5.68% of the night in stage N1 sleep, 72.84% in stage N2 sleep, 0.41% in stage N3 and 21.08% in REM. Alpha intrusion was absent. Supine sleep was 49.92%.  RESPIRATORY PARAMETERS The overall apnea/hypopnea index (AHI) was 16.4 per hour. There were 51 total apneas, including 51 obstructive, 0 central and 0 mixed apneas. There were 50 hypopneas and 0 RERAs. The AHI during Stage REM sleep was 18.5 per hour. AHI while supine was 24.4 per hour. The mean oxygen saturation was 94.79%. The minimum SpO2 during sleep was 78.00%. Loud snoring was noted during this study.  CARDIAC DATA The 2 lead EKG demonstrated sinus  rhythm. The mean heart rate was 48.72 beats per minute. Other EKG findings include: None.  LEG MOVEMENT DATA The total PLMS were 229 with a resulting PLMS index of 37.14. Associated arousal with leg movement index was 0.2 .  IMPRESSIONS - Moderate obstructive sleep apnea occurred during this study (AHI = 16.4/h). - No significant central sleep apnea occurred during this study (CAI = 0.0/h). - Moderate oxygen desaturation was noted during this study (Min O2 = 78.00%). - The patient snored with Loud snoring volume. - No cardiac abnormalities were noted during this study. - Moderate periodic limb movements of sleep occurred during the study. No significant associated arousals.  DIAGNOSIS - Obstructive Sleep Apnea (327.23 [G47.33 ICD-10]) - Nocturnal Hypoxemia (327.26 [G47.36 ICD-10])  RECOMMENDATIONS - Therapeutic CPAP titration to determine optimal pressure required to alleviate sleep disordered breathing. - Positional therapy avoiding supine position during sleep. - Avoid alcohol, sedatives and other CNS depressants that may worsen sleep apnea and disrupt normal sleep architecture. - Sleep hygiene should be reviewed to assess factors that may improve sleep quality. - Weight management and regular exercise should be initiated or continued if appropriate.   Quintella Reichert Diplomate, American Board of Sleep Medicine  ELECTRONICALLY SIGNED ON:  11/15/2015, 9:03 PM [facility] PH: [phone]   FX: [phone] ACCREDITED BY THE AMERICAN ACADEMY OF SLEEP MEDICINE

## 2015-11-15 NOTE — Telephone Encounter (Signed)
Please let patient know that they have sleep apnea and recommend CPAP titration. Please set up titration in the sleep lab. 

## 2015-11-17 ENCOUNTER — Encounter: Payer: Self-pay | Admitting: Family Medicine

## 2015-11-17 ENCOUNTER — Ambulatory Visit (INDEPENDENT_AMBULATORY_CARE_PROVIDER_SITE_OTHER): Payer: 59 | Admitting: Family Medicine

## 2015-11-17 VITALS — BP 110/73 | HR 117 | Temp 98.8°F | Resp 16 | Ht 70.0 in | Wt 244.0 lb

## 2015-11-17 DIAGNOSIS — M546 Pain in thoracic spine: Secondary | ICD-10-CM

## 2015-11-17 DIAGNOSIS — G5622 Lesion of ulnar nerve, left upper limb: Secondary | ICD-10-CM

## 2015-11-17 MED ORDER — HYDROCODONE-ACETAMINOPHEN 5-325 MG PO TABS: 1.0000 | ORAL_TABLET | Freq: Four times a day (QID) | ORAL | Status: DC | PRN

## 2015-11-17 MED ORDER — METHYLPREDNISOLONE ACETATE 40 MG/ML IJ SUSP
40.0000 mg | Freq: Once | INTRAMUSCULAR | Status: AC
  Administered 2015-11-17: 40 mg via INTRAMUSCULAR

## 2015-11-17 NOTE — Progress Notes (Signed)
Pre visit review using our clinic review tool, if applicable. No additional management support is needed unless otherwise documented below in the visit note. 

## 2015-11-17 NOTE — Progress Notes (Signed)
OFFICE NOTE  11/17/2015  CC:  Chief Complaint  Patient presents with  . Shoulder Pain    Left x 4 days.     HPI: Patient is a [name] who is here for L shoulder pain. Describes tripping and falling at work 09/29/15, hurt R shoulder and R knee, was evaluated by ortho and he was set up with PT for R shoulder strain.    Started hurting in L scapula area 5 days ago while doing some PT exercises, acute onset, intense pain, constant and intensifying as time goes by.  At work today he could not stand the pain, took a rest and drank some water and vomited this up at work so they sent him to me. Actual shoulder not painful, neck not painful.  Has a tingling sensation from scapula on L down L arm to 4th and 5th digits intermittently.  At some points, he says the pain is so intense that it makes his entire body hurt briefly.   Certain movements of neck and back and L arm really intensify the pain: esp L shoulder ABduction and flexion. No SOB, no CP, no jaw pain, no palpitations.   Interim hx: sleep study 11/08/15: no result yet. L spine MRI done 10/21/15 by his neurologist: DDD/spondylosis with bulging discs at L3-4 through L5-S1.  Pertinent PMH:  Past medical, surgical, social, and family history reviewed and no changes are noted since last office visit.  MEDS:  Outpatient Prescriptions Prior to Visit  Medication Sig Dispense Refill  . aspirin 81 MG chewable tablet Chew 1 tablet (81 mg total) by mouth daily.    [name] atorvastatin (LIPITOR) 20 MG tablet Take 1 tablet (20 mg total) by mouth daily at 6 PM. 30 tablet 11  . carvedilol (COREG) 25 MG tablet Take 0.5 tablets (12.5 mg total) by mouth 2 (two) times daily. 90 tablet 3  . furosemide (LASIX) 40 MG tablet Take 0.5 tablets (20 mg total) by mouth daily. 30 tablet 6  . insulin aspart (NOVOLOG) 100 UNIT/ML injection Inject 0-10 Units into the skin 3 (three) times daily before meals. Sliding scale 10 mL 6  . Insulin Degludec  (TRESIBA FLEXTOUCH) 100 UNIT/ML SOPN Inject 18 Units into the skin at bedtime. 5 pen 2  . patiromer (VELTASSA) 8.4 G packet Take 1 packet (8.4 g total) by mouth daily. 30 packet   . furosemide (LASIX) 40 MG tablet Take 0.5 tablets (20 mg total) by mouth daily. Hold if weight is 225 lb or less (Patient not taking: Reported on 11/17/2015) 15 tablet 6  . furosemide (LASIX) 40 MG tablet Take 0.5 tablets (20 mg total) by mouth daily. (Patient not taking: Reported on 11/17/2015) 30 tablet 6  . glucose blood (FREESTYLE TEST STRIPS) test strip Use as instructed (Patient not taking: Reported on 10/07/2015) 100 each 6  . Insulin Pen Needle 32G X 6 MM MISC Use for insulin injections 4 times per day (Patient not taking: Reported on 10/07/2015) 150 each 12  . lidocaine (LIDODERM) 5 % Place 1 patch onto the skin daily. Remove & Discard patch within 12 hours or as directed by MD (Patient not taking: Reported on 10/07/2015) 30 patch 0  . lisinopril (PRINIVIL,ZESTRIL) 5 MG tablet Take 1 tablet (5 mg total) by mouth daily. (Patient not taking: Reported on 11/17/2015) 30 tablet 3  . nitroGLYCERIN (NITROSTAT) 0.4 MG SL tablet Place 1 tablet (0.4 mg total) under the tongue every 5 (five) minutes as needed for chest pain. (Patient  not taking: Reported on 10/07/2015) 25 tablet 0   No facility-administered medications prior to visit.    PE: Blood pressure 110/73, pulse 117, temperature 98.8 F (37.1 C), temperature source Oral, resp. rate 16, height 5\' 10"  (1.778 m), weight 244 lb (110.678 kg), SpO2 95 %. Gen: alert, oriented x 4, appears in mild distress due to pain. NID:POEU: no injection, icteris, swelling, or exudate.  EOMI, PERRLA. Mouth: lips without lesion/swelling.  Oral mucosa pink and moist. Oropharynx without erythema, exudate, or swelling.  NECK: no tenderness to palpation.  Neck ROM limited in all motions b/c this elicits L scapula pain. Shoulders and traps nontender.  ROM of shoulders intact but ABduction  and flexion of L shoulder elicits L scapula region pain.  Spurling's elicits pain in L scapula region.   He has point tenderness in soft tissue area just medial to the medial border of his L scapula at about T 4 level.  No spinous tenderness, no scapular tenderness.  No rash.  No nodule or palpable soft tissue defect. UE strength 5/5 prox and dist. UE sensation intact to touch with piece of gauze--no asymmetry. Tinel's testing on ulnar groove of L arm did elicit tingling sensation in ulnar distribution of L lower arm and hand/fingers.  Bending the L arm at the elbow also worsened this sensation. CV: RRR, 2/6 systolic murmur, no diastolic murmur.  No rub or gallop. Chest is clear, no wheezing or rales. Normal symmetric air entry throughout both lung fields. No chest wall deformities or tenderness.   IMPRESSION AND PLAN:  1) Myofascial trigger point, medial to medial border of L scapula: pt agreeable to trigger point injection today. Vicodin 5/325, 1-2 q6h prn, #30--rx handed to pt today.  Therapeutic expectations and side effect profile of medication discussed today.  Patient's questions answered. A trigger point injection was performed at the site of maximal tenderness using 1/2 ml of 2% plain Lidocaine and 1/2 ml of 40mg /ml depo-medrol. This was well tolerated, no immediate complications.   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 I have lower suspicion that his L arm sx's are from cervical radiculopathy. Watchful waiting discussed, recommended he sleep with pillow in crook of left arm/elbow to inhibit flexing/stretching ulnar nerve.  Wrote a note to his PT asking them to alter PT for R shoulder in ways that could possibly not exacerbate his current scapular and L arm sx's.  An After Visit Summary was printed and given to the patient.  FOLLOW UP: keep appt already set up for 12/2015, return or call earlier if not improved.

## 2015-11-19 NOTE — Telephone Encounter (Signed)
Left message for patient to call back  

## 2015-11-20 ENCOUNTER — Encounter: Payer: Self-pay | Admitting: *Deleted

## 2015-11-20 NOTE — Addendum Note (Signed)
Addended by: Arcola Jansky on: 11/20/2015 09:39 AM   Modules accepted: Orders

## 2015-11-20 NOTE — Telephone Encounter (Signed)
Patient informed of results. Stated verbal understanding.   Okay to proceed with Titration - will schedule and mail letter to patient

## 2015-12-28 ENCOUNTER — Telehealth (HOSPITAL_COMMUNITY): Payer: Self-pay

## 2015-12-28 MED ORDER — FUROSEMIDE 20 MG PO TABS: 20.0000 mg | ORAL_TABLET | Freq: Every day | ORAL | Status: DC

## 2015-12-28 NOTE — Telephone Encounter (Signed)
Patient left VM on CHF triage line to refill lasix. States he has had to take a whole pill some days instead of a half for swelling/edema and has used his medications too quickly to be filled. Rx refilled to preferred pharmacy electronically. Patient has an apt with CHF clinic in 2 weeks, will address taking extra PRN lasix at that time with providers.  Ave Filter

## 2015-12-30 ENCOUNTER — Encounter: Payer: Self-pay | Admitting: Family Medicine

## 2015-12-30 ENCOUNTER — Ambulatory Visit (INDEPENDENT_AMBULATORY_CARE_PROVIDER_SITE_OTHER): Payer: 59 | Admitting: Family Medicine

## 2015-12-30 VITALS — BP 151/86 | HR 73 | Temp 97.9°F | Resp 16 | Ht 70.0 in | Wt 258.0 lb

## 2015-12-30 DIAGNOSIS — R188 Other ascites: Secondary | ICD-10-CM

## 2015-12-30 DIAGNOSIS — D696 Thrombocytopenia, unspecified: Secondary | ICD-10-CM | POA: Diagnosis not present

## 2015-12-30 DIAGNOSIS — G4733 Obstructive sleep apnea (adult) (pediatric): Secondary | ICD-10-CM

## 2015-12-30 DIAGNOSIS — K7460 Unspecified cirrhosis of liver: Secondary | ICD-10-CM

## 2015-12-30 DIAGNOSIS — K7581 Nonalcoholic steatohepatitis (NASH): Secondary | ICD-10-CM | POA: Diagnosis not present

## 2015-12-30 DIAGNOSIS — N183 Chronic kidney disease, stage 3 unspecified: Secondary | ICD-10-CM

## 2015-12-30 DIAGNOSIS — R609 Edema, unspecified: Secondary | ICD-10-CM

## 2015-12-30 DIAGNOSIS — R600 Localized edema: Secondary | ICD-10-CM

## 2015-12-30 LAB — COMPREHENSIVE METABOLIC PANEL
ALT: 22 U/L (ref 0–53)
AST: 33 U/L (ref 0–37)
Albumin: 2.5 g/dL — ABNORMAL LOW (ref 3.5–5.2)
Alkaline Phosphatase: 129 U/L — ABNORMAL HIGH (ref 39–117)
BUN: 36 mg/dL — AB (ref 6–23)
CHLORIDE: 107 meq/L (ref 96–112)
CO2: 28 meq/L (ref 19–32)
CREATININE: 1.47 mg/dL (ref 0.40–1.50)
Calcium: 8.3 mg/dL — ABNORMAL LOW (ref 8.4–10.5)
GFR: 53.18 mL/min — ABNORMAL LOW (ref >60.00)
Glucose, Bld: 127 mg/dL — ABNORMAL HIGH (ref 70–99)
POTASSIUM: 4.9 meq/L (ref 3.5–5.1)
SODIUM: 139 meq/L (ref 135–145)
Total Bilirubin: 0.5 mg/dL (ref 0.2–1.2)
Total Protein: 4.6 g/dL — ABNORMAL LOW (ref 6.0–8.3)

## 2015-12-30 LAB — CBC WITH DIFFERENTIAL/PLATELET
BASOS PCT: 1.5 % (ref 0.0–3.0)
Basophils Absolute: 0.1 10*3/uL (ref 0.0–0.1)
EOS ABS: 0.1 10*3/uL (ref 0.0–0.7)
Eosinophils Relative: 2.6 % (ref 0.0–5.0)
HCT: 37.3 % — ABNORMAL LOW (ref 39.0–52.0)
Hemoglobin: 12.2 g/dL — ABNORMAL LOW (ref 13.0–17.0)
LYMPHS ABS: 0.6 10*3/uL — AB (ref 0.7–4.0)
Lymphocytes Relative: 11.3 % — ABNORMAL LOW (ref 12.0–46.0)
MCHC: 32.7 g/dL (ref 30.0–36.0)
MCV: 89.9 fl (ref 78.0–100.0)
MONO ABS: 0.3 10*3/uL (ref 0.1–1.0)
Monocytes Relative: 6.6 % (ref 3.0–12.0)
NEUTROS ABS: 4.1 10*3/uL (ref 1.4–7.7)
Neutrophils Relative %: 78 % — ABNORMAL HIGH (ref 43.0–77.0)
PLATELETS: 99 10*3/uL — AB (ref 150.0–400.0)
RBC: 4.15 Mil/uL — ABNORMAL LOW (ref 4.22–5.81)
RDW: 13.8 % (ref 11.5–15.5)
WBC: 5.2 10*3/uL (ref 4.0–10.5)

## 2015-12-30 NOTE — Progress Notes (Signed)
OFFICE VISIT  12/30/2015   CC:  Chief Complaint  [name] presents with  . Follow-up    Pt is not fasting.    HPI:    [name] is a 55 y.o. Caucasian male who presents for 4 mo f/u ischemic CM, cirrhosis secondary to NASH with portal HTN and hx of splenomegaly and thrombocytopenia.  His DM is managed by endocrinologist.  Of note, his plavix was d/c'd by cardiology 09/2015 b/c he had been on DAPT for 1 year s/p his stent placement.    Says he feels miserable due to fluid retention/bloating the last week or so.  Feels it from upper abd region all the way down abd, groin, hips, thighs, and lower legs/feet. Tries to watch sodium as per his usual.  He ran out of his lasix x 5d, just restarted it yesterday at 20 mg qd dosing.  Breathing feels heavy going up stairs but on flat surfaces or at rest he is fine.  No CP or palpitations.  No dizziness.  BP's at work: yesterday 139/80.  No hematuria, hematochezia, or melena. Glucoses "bouncing up" some lately: 126-301.  Sleep study done 11/2015 sounds like it was + for OSA per pt, he goes back for CPAP titration 01/15/16.   Past Medical History  Diagnosis Date  . Heart murmur     ECHO 07/2011 showed mild AS and LVH.  TEE 09/2011 showed small PFO, bicuspid aortic valve but no stenosis or regurg.  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  Gen surg and GI recommended flagyl and cipro x 2 wks..  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  . Cholelithiasis     u/s--no cholecystitis.  06/2014 u/s showed no stones, only GB sludge  . Peripheral neuropathy (HCC)     Diabetic:  Autonomic (pelvic) and LE polyneuropathy--[facility] testing showed that it is likely from DM.  Followed by Dr. Allena Katz with [facility] as of 09/2015  . Diabetic foot ulcers (HCC)     Poor healing; normal ABIs and TBIs  .  History of pyelonephritis   . Type II or unspecified type diabetes mellitus with neurological manifestations, not stated as uncontrolled     IDDM: neuro, renal, and ophth complications  . Wears dentures     upper  . Osteomyelitis of toe of left foot (HCC)     left 4th and 5th toes--now s/p amputation of these areas  . Diabetic retinopathy associated with type 2 diabetes mellitus (HCC)     Laser tx in both eyes (Dr. Allyne Gee): severe nonprolif diab retpthy, diabetic macular edema (05/2015)  . Diabetic nephropathy (HCC)     Proteinuria and CrCl 55-65  . Chronic renal insufficiency, stage III (moderate)     CrCl 50s-80s 2014 to pre-VFIB arrest 10/02/14, at which time he sustained AKI.  . Lumbar spondylosis     MRI 08/25/11 with multilevel facet dz, small disc protrusion at L5-S1 to the right without impingement; repeat L spine MRI 09/2015 same except L5-S1 disc bulge causing some mass effect on R intraspinal S1 nerve root.  No canal stenosis.  . Ischemic cardiomyopathy 07/22/14    Myoview w/out ischemia but large scar (inferior/apex) w/ global hypokinesis--medical mgmt  . History of pancreatitis 2014 and 2015    Suspected biliary: passing small gallstones: surgery declined to remove GB 07/2014.  . Aspiration pneumonia (  HCC)   . Thrombocytopenia (HCC)   . Portal hypertension (HCC)   . Liver cirrhosis secondary to NASH     ?+ alcohol?.  With hx of splenomegaly and ascities (dx approx 2012)  . History of gram negative sepsis 2015    e coli  . Ventricular fibrillation (HCC) 10/02/14    Resuscitated, hospitalized, 3 V CAD detected, DES stent to mid LAD and to RCA were placed.  . Peripheral edema     chronic asymmetric LE edema (L>>R)  . OSA (obstructive sleep apnea) 11/15/2015    Moderate with AHI 16/hr  . Hyperkalemia     cannot use ACE-I/ARB's/aldactone due to this.  Pt on patiromer Lelon Perla) --to help him excrete more potassium in feces  . Diabetic neuropathy with neurologic complication (HCC)      Large fiber, L>R; affecting sensation and proprioception    Past Surgical History  Procedure Laterality Date  . Knee arthroscopy  2001    Bilateral  . Amputation  10/24/2012    Procedure: AMPUTATION RAY;  Surgeon: Sherri Rad, MD;  Location: [facility];  Service: Orthopedics;  Laterality: Left;  left 4th toe amputation through MTP joint, 5th Ray amputation   . Knee surgery  2001  . Hida scan  06/2014    Normal  . Transthoracic echocardiogram  07/19/14;12/2014;03/2015    Mild LV dilation and hypertrophy, EF 35-40%, no wall motion abnormalities, grade II diast dysfxn, bicuspid aortic valve with mild AS and mild AI.  No change on 12/2014 echo.  03/2015 EF improved to 40-45%, moderate AS/bicuspid aortic valve  . Cardiovascular stress test  07/2014    Myoview w/out ischemia but large scar (inferior/apex) w/ global hypokinesis--medical mgmt  . Eye surgery  08/2014    Retinal hemorrhage evacuation  . Coronary angioplasty with stent placement  09/2014    DES to mid LAD and RCA; EF 40% by cardiac MRI  . Cardiac mri  09/2014    Moderately dilated LV.  EF 40%, multiple wall motion abnormalities, normal RV fxn.  Some areas of scarring.  . Left heart catheterization with coronary angiogram N/A 10/10/2014    Procedure: LEFT HEART CATHETERIZATION WITH CORONARY ANGIOGRAM;  Surgeon: Peter M Swaziland, MD;  Location: [facility] CATH LAB;  Service: Cardiovascular;  Laterality: N/A;  . Left heart catheterization with coronary angiogram N/A 10/15/2014    Procedure: LEFT HEART CATHETERIZATION WITH CORONARY ANGIOGRAM;  Surgeon: Iran Ouch, MD;  Location: [facility] CATH LAB;  Service: Cardiovascular;  Laterality: N/A;  . Carotid dopplers  04/10/15    1-35% bilat; normal subclavians    Outpatient Prescriptions Prior to Visit  Medication Sig Dispense Refill  . aspirin 81 MG chewable tablet Chew 1 tablet (81 mg total) by mouth daily.    [name] atorvastatin (LIPITOR) 20 MG tablet Take 1 tablet (20 mg total) by mouth  daily at 6 PM. 30 tablet 11  . carvedilol (COREG) 25 MG tablet Take 0.5 tablets (12.5 mg total) by mouth 2 (two) times daily. 90 tablet 3  . furosemide (LASIX) 20 MG tablet Take 1 tablet (20 mg total) by mouth daily. 30 tablet 6  . insulin aspart (NOVOLOG) 100 UNIT/ML injection Inject 0-10 Units into the skin 3 (three) times daily before meals. Sliding scale 10 mL 6  . Insulin Degludec (TRESIBA FLEXTOUCH) 100 UNIT/ML SOPN Inject 18 Units into the skin at bedtime. 5 pen 2  . patiromer (VELTASSA) 8.4 G packet Take 1 packet (8.4 g total) by mouth daily. 30  packet   . HYDROcodone-acetaminophen (NORCO/VICODIN) 5-325 MG tablet Take 1-2 tablets by mouth every 6 (six) hours as needed for moderate pain. ([name] not taking: Reported on 12/30/2015) 30 tablet 0   No facility-administered medications prior to visit.    Allergies  Allergen Reactions  . Iodine Other (See Comments)    Family history of anaphylaxis, unsure if [name] has reaction  . Other Shortness Of Breath, Itching and Other (See Comments)    PERFUMES - SNEEZING  . Hydralazine Hcl Hives  . Naproxen Nausea And Vomiting    ROS As per HPI  PE: Blood pressure 151/86, pulse 73, temperature 97.9 F (36.6 C), temperature source Oral, resp. rate 16, height 5\' 10"  (1.778 m), weight 258 lb (117.028 kg), SpO2 99 %. Gen: Alert, well appearing.  [name] is oriented to person, place, time, and situation. VOH:YWVP: no injection, icteris, swelling, or exudate.  EOMI, PERRLA. Mouth: lips without lesion/swelling.  Oral mucosa pink and moist. Oropharynx without erythema, exudate, or swelling.  CV: RRR, no m/r/g Chest is clear, no wheezing or rales. Normal symmetric air entry throughout both lung fields. No chest wall deformities or tenderness. ABD: soft, NT, rotund but not significantly distended but some flank dullness can be percussed.  BS normal.  NO HSM or bruit. EXT: 3+ pitting edema from knees down into feet bilat  LABS:  Lab Results   Component Value Date   TSH 1.900 07/19/2014   Lab Results  Component Value Date   WBC 4.9 10/06/2015   HGB 12.6* 10/06/2015   HCT 36.6* 10/06/2015   MCV 89.5 10/06/2015   PLT 71* 10/06/2015   Lab Results  Component Value Date   CREATININE 1.36* 10/06/2015   BUN 37* 10/06/2015   NA 137 10/06/2015   K 5.0 10/06/2015   CL 106 10/06/2015   CO2 25 10/06/2015   Lab Results  Component Value Date   ALT 27 12/18/2014   AST 32 12/18/2014   ALKPHOS 165* 12/18/2014   BILITOT 0.7 12/18/2014   Lab Results  Component Value Date   CHOL 132 02/09/2015   Lab Results  Component Value Date   HDL 41 02/09/2015   Lab Results  Component Value Date   LDLCALC 59 02/09/2015   Lab Results  Component Value Date   TRIG 160* 02/09/2015   Lab Results  Component Value Date   CHOLHDL 3.2 02/09/2015   Lab Results  Component Value Date   PSA 0.36 08/25/2011    IMPRESSION AND PLAN:  1) Ischemic CM + cirrhosis with portal HTN:  Volume overloaded currently due to being off his diuretic x 5d. Starting today I will have him take 40mg  lasix qd and will recheck in office in 1 wk. Check  CMET today.  2) Hx of thrombocytopenia secondary to splenomegaly/portal HTN:  He is now only on ASA.  No sign of bleeding. Monitor platelets again today.  3) CRI stage 3; check lytes/cr today and at f/u in 1 wk.  4) OSA: he is to get CPAP titration at the end of this month.  Hopefull correcting sleep disordered breathing will help some with his tendency towards peripheral edema.  An After Visit Summary was printed and given to the [name].  FOLLOW UP: Return in about 1 week (around 01/06/2016) for recheck edema.

## 2015-12-30 NOTE — Patient Instructions (Signed)
Increase your lasix (furosemide) to 40 mg once daily until I see you again in 1 week.

## 2015-12-30 NOTE — Progress Notes (Signed)
Pre visit review using our clinic review tool, if applicable. No additional management support is needed unless otherwise documented below in the visit note. 

## 2016-01-05 ENCOUNTER — Telehealth: Payer: Self-pay | Admitting: *Deleted

## 2016-01-05 NOTE — Telephone Encounter (Signed)
Sounds like trigger fingers/finger tendon issue. Ok to wait to see me tomorrow.

## 2016-01-05 NOTE — Telephone Encounter (Signed)
Pt called and stated that he is having some trouble with his hands. He stated that it started in his right hand but is now in his left hand. He stated that its like his fingers lock up and he is unable to move them without using his other hand. He stated that it is painful. He has an apt tomorrow with Dr. [name]. He wants to know what could be causing this? Please advise. Thanks.

## 2016-01-05 NOTE — Telephone Encounter (Signed)
Pt advised and voiced understanding.   

## 2016-01-06 ENCOUNTER — Ambulatory Visit (HOSPITAL_BASED_OUTPATIENT_CLINIC_OR_DEPARTMENT_OTHER)
Admission: RE | Admit: 2016-01-06 | Discharge: 2016-01-06 | Disposition: A | Discharge status: Home / Self Care | Payer: 59 | Source: Ambulatory Visit | Attending: Family Medicine | Admitting: Family Medicine

## 2016-01-06 ENCOUNTER — Encounter: Payer: Self-pay | Admitting: Family Medicine

## 2016-01-06 ENCOUNTER — Ambulatory Visit (INDEPENDENT_AMBULATORY_CARE_PROVIDER_SITE_OTHER): Payer: 59 | Admitting: Family Medicine

## 2016-01-06 VITALS — BP 155/77 | HR 74 | Temp 98.5°F | Resp 16 | Ht 70.0 in | Wt 248.8 lb

## 2016-01-06 DIAGNOSIS — R609 Edema, unspecified: Secondary | ICD-10-CM | POA: Insufficient documentation

## 2016-01-06 DIAGNOSIS — N182 Chronic kidney disease, stage 2 (mild): Secondary | ICD-10-CM

## 2016-01-06 DIAGNOSIS — I255 Ischemic cardiomyopathy: Secondary | ICD-10-CM

## 2016-01-06 DIAGNOSIS — R188 Other ascites: Secondary | ICD-10-CM

## 2016-01-06 DIAGNOSIS — M79605 Pain in left leg: Secondary | ICD-10-CM | POA: Insufficient documentation

## 2016-01-06 DIAGNOSIS — M7989 Other specified soft tissue disorders: Secondary | ICD-10-CM

## 2016-01-06 DIAGNOSIS — K7460 Unspecified cirrhosis of liver: Secondary | ICD-10-CM

## 2016-01-06 HISTORY — PX: OTHER SURGICAL HISTORY: SHX169

## 2016-01-06 LAB — BASIC METABOLIC PANEL
BUN: 37 mg/dL — AB (ref 7–25)
CHLORIDE: 103 mmol/L (ref 98–110)
CO2: 25 mmol/L (ref 20–31)
CREATININE: 1.61 mg/dL — AB (ref 0.70–1.33)
Calcium: 8.2 mg/dL — ABNORMAL LOW (ref 8.6–10.3)
GLUCOSE: 99 mg/dL (ref 65–99)
Potassium: 5 mmol/L (ref 3.5–5.3)
Sodium: 137 mmol/L (ref 135–146)

## 2016-01-06 LAB — MAGNESIUM: Magnesium: 1.8 mg/dL (ref 1.5–2.5)

## 2016-01-06 MED ORDER — FUROSEMIDE 20 MG PO TABS: ORAL_TABLET | ORAL | Status: DC

## 2016-01-06 NOTE — Progress Notes (Signed)
OFFICE VISIT  01/06/2016   CC:  Chief Complaint  Patient presents with  . Follow-up    Edema. Pt is not fasting.    HPI:    Patient is a 55 y.o. Caucasian male who presents for 1 week f/u volume overload. He has now been back on lasix 40mg  qd (double his usual daily dose) x 1 week and has lost about 10 lbs by our scales.  Feeling better but says L thigh feels tighter/more sore than left.   Still with signif LE swelling bilat. No SOB or chest pain.  Just feels uncomfortable musculoskeletal-wise b/c of feeling excessive fluid/water wt.  Past Medical History  Diagnosis Date  . Heart murmur     ECHO 07/2011 showed mild AS and LVH.  TEE 09/2011 showed small PFO, bicuspid aortic valve but no stenosis or regurg.  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  Gen surg and GI recommended flagyl and cipro x 2 wks..  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  . Cholelithiasis     u/s--no cholecystitis.  06/2014 u/s showed no stones, only GB sludge  . Peripheral neuropathy (HCC)     Diabetic:  Autonomic (pelvic) and LE polyneuropathy--WFUB testing showed that it is likely from DM.  Followed by Dr. Allena Katz with [place] [facility] as of 09/2015  . Diabetic foot ulcers (HCC)     Poor healing; normal ABIs and TBIs  . History of pyelonephritis   . Type II or unspecified type diabetes mellitus with neurological manifestations, not stated as uncontrolled     IDDM: neuro, renal, and ophth complications  . Wears dentures     upper  . Osteomyelitis of toe of left foot (HCC)     left 4th and 5th toes--now s/p amputation of these areas  . Diabetic retinopathy associated with type 2 diabetes mellitus (HCC)     Laser tx in both eyes (Dr. Allyne Gee): severe nonprolif diab retpthy, diabetic macular edema (05/2015)  . Diabetic nephropathy (HCC)      Proteinuria and CrCl 55-65  . Chronic renal insufficiency, stage III (moderate)     CrCl 50s-80s 2014 to pre-VFIB arrest 10/02/14, at which time he sustained AKI.  . Lumbar spondylosis     MRI 08/25/11 with multilevel facet dz, small disc protrusion at L5-S1 to the right without impingement; repeat L spine MRI 09/2015 same except L5-S1 disc bulge causing some mass effect on R intraspinal S1 nerve root.  No canal stenosis.  . Ischemic cardiomyopathy 07/22/14    Myoview w/out ischemia but large scar (inferior/apex) w/ global hypokinesis--medical mgmt  . History of pancreatitis 2014 and 2015    Suspected biliary: passing small gallstones: surgery declined to remove GB 07/2014.  . Aspiration pneumonia (HCC)   . Thrombocytopenia (HCC)   . Portal hypertension (HCC)   . Liver cirrhosis secondary to NASH     ?+ alcohol?.  With hx of splenomegaly and ascities (dx approx 2012)  . History of gram negative sepsis 2015    e coli  . Ventricular fibrillation (HCC) 10/02/14    Resuscitated, hospitalized, 3 V CAD detected, DES stent to mid LAD and to RCA were placed.  . Peripheral edema     chronic asymmetric LE edema (L>>R)  . OSA (obstructive sleep apnea) 11/15/2015    Moderate with AHI 16/hr  . Hyperkalemia  cannot use ACE-I/ARB's/aldactone due to this.  Pt on patiromer Lelon Perla) --to help him excrete more potassium in feces  . Diabetic neuropathy with neurologic complication (HCC)     Large fiber, L>R; affecting sensation and proprioception    Past Surgical History  Procedure Laterality Date  . Knee arthroscopy  2001    Bilateral  . Amputation  10/24/2012    Procedure: AMPUTATION RAY;  Surgeon: Sherri Rad, MD;  Location:  SURGERY CENTER;  Service: Orthopedics;  Laterality: Left;  left 4th toe amputation through MTP joint, 5th Ray amputation   . Knee surgery  2001  . Hida scan  06/2014    Normal  . Transthoracic echocardiogram  07/19/14;12/2014;03/2015    Mild LV dilation and  hypertrophy, EF 35-40%, no wall motion abnormalities, grade II diast dysfxn, bicuspid aortic valve with mild AS and mild AI.  No change on 12/2014 echo.  03/2015 EF improved to 40-45%, moderate AS/bicuspid aortic valve  . Cardiovascular stress test  07/2014    Myoview w/out ischemia but large scar (inferior/apex) w/ global hypokinesis--medical mgmt  . Eye surgery  08/2014    Retinal hemorrhage evacuation  . Coronary angioplasty with stent placement  09/2014    DES to mid LAD and RCA; EF 40% by cardiac MRI  . Cardiac mri  09/2014    Moderately dilated LV.  EF 40%, multiple wall motion abnormalities, normal RV fxn.  Some areas of scarring.  . Left heart catheterization with coronary angiogram N/A 10/10/2014    Procedure: LEFT HEART CATHETERIZATION WITH CORONARY ANGIOGRAM;  Surgeon: Peter M Swaziland, MD;  Location: [facility] CATH LAB;  Service: Cardiovascular;  Laterality: N/A;  . Left heart catheterization with coronary angiogram N/A 10/15/2014    Procedure: LEFT HEART CATHETERIZATION WITH CORONARY ANGIOGRAM;  Surgeon: Iran Ouch, MD;  Location: [facility] CATH LAB;  Service: Cardiovascular;  Laterality: N/A;  . Carotid dopplers  04/10/15    1-35% bilat; normal subclavians    Outpatient Prescriptions Prior to Visit  Medication Sig Dispense Refill  . aspirin 81 MG chewable tablet Chew 1 tablet (81 mg total) by mouth daily.    [name] atorvastatin (LIPITOR) 20 MG tablet Take 1 tablet (20 mg total) by mouth daily at 6 PM. 30 tablet 11  . carvedilol (COREG) 25 MG tablet Take 0.5 tablets (12.5 mg total) by mouth 2 (two) times daily. 90 tablet 3  . gabapentin (NEURONTIN) 300 MG capsule Take 300 mg by mouth at bedtime.  1  . insulin aspart (NOVOLOG) 100 UNIT/ML injection Inject 0-10 Units into the skin 3 (three) times daily before meals. Sliding scale 10 mL 6  . Insulin Degludec (TRESIBA FLEXTOUCH) 100 UNIT/ML SOPN Inject 18 Units into the skin at bedtime. 5 pen 2  . patiromer (VELTASSA) 8.4 G packet Take 1 packet (8.4 g  total) by mouth daily. 30 packet   . furosemide (LASIX) 20 MG tablet Take 1 tablet (20 mg total) by mouth daily. 30 tablet 6   No facility-administered medications prior to visit.    Allergies  Allergen Reactions  . Iodine Other (See Comments)    Family history of anaphylaxis, unsure if patient has reaction  . Other Shortness Of Breath, Itching and Other (See Comments)    PERFUMES - SNEEZING  . Hydralazine Hcl Hives  . Naproxen Nausea And Vomiting    ROS As per HPI  PE: Blood pressure 155/77, pulse 74, temperature 98.5 F (36.9 C), temperature source Oral, resp. rate 16, height 5\' 10"  (1.778  m), weight 248 lb 12 oz (112.832 kg), SpO2 98 %. Gen: Alert, well appearing.  Patient is oriented to person, place, time, and situation. CV: RRR, soft systolic murmur at cardiac base, no diastolic murmur, no rub or gallop. Chest is clear, no wheezing or rales. Normal symmetric air entry throughout both lung fields. No chest wall deformities or tenderness. ABD: rotund but no tense distention.  No tenderness. EXT: 3+ pitting edema in both LL's from knees down into feet.   Left thigh with tenderness to palpation in posterior and lateral aspects, with a few non-inflamed varicosities present.  No tenderness in popliteal fossa.  His thigh circumference at 10 cm above patella is 53 1/2 cm on L and 52 cm on R.   LABS:    Chemistry      Component Value Date/Time   NA 139 12/30/2015 0930   K 4.9 12/30/2015 0930   CL 107 12/30/2015 0930   CO2 28 12/30/2015 0930   BUN 36* 12/30/2015 0930   CREATININE 1.47 12/30/2015 0930   CREATININE 1.27 02/20/2015 1006      Component Value Date/Time   CALCIUM 8.3* 12/30/2015 0930   ALKPHOS 129* 12/30/2015 0930   AST 33 12/30/2015 0930   ALT 22 12/30/2015 0930   BILITOT 0.5 12/30/2015 0930     Lab Results  Component Value Date   WBC 5.2 12/30/2015   HGB 12.2* 12/30/2015   HCT 37.3* 12/30/2015   MCV 89.9 12/30/2015   PLT 99.0* 12/30/2015      IMPRESSION AND PLAN:  1) Worsened peripheral edema (he has mildly depressed EF due to ischemic CM plus he has portal HTN secondary to cirrhosis) most likely due to being off of his diuretic for a whole week recently. He is getting some fluid off with lasix at double his usual dosing but still has quite a bit of LE edema so we'll continue at  qd and recheck lytes/cr today.  2) Left thigh pain; need to r/o DVT. Set pt up for LE venous doppler today at 5:30 at [facility].  An After Visit Summary was printed and given to the patient.  FOLLOW UP: No Follow-up on file.1 wk

## 2016-01-06 NOTE — Progress Notes (Signed)
Pre visit review using our clinic review tool, if applicable. No additional management support is needed unless otherwise documented below in the visit note. 

## 2016-01-07 ENCOUNTER — Encounter (HOSPITAL_BASED_OUTPATIENT_CLINIC_OR_DEPARTMENT_OTHER): Payer: Self-pay

## 2016-01-11 ENCOUNTER — Encounter (HOSPITAL_COMMUNITY): Payer: Self-pay

## 2016-01-11 ENCOUNTER — Ambulatory Visit (HOSPITAL_COMMUNITY)
Admission: RE | Admit: 2016-01-11 | Discharge: 2016-01-11 | Disposition: A | Discharge status: Home / Self Care | Payer: 59 | Source: Ambulatory Visit | Attending: Cardiology | Admitting: Cardiology

## 2016-01-11 VITALS — BP 180/78 | HR 70 | Wt 248.5 lb

## 2016-01-11 DIAGNOSIS — Z79899 Other long term (current) drug therapy: Secondary | ICD-10-CM | POA: Diagnosis not present

## 2016-01-11 DIAGNOSIS — Z7982 Long term (current) use of aspirin: Secondary | ICD-10-CM | POA: Diagnosis not present

## 2016-01-11 DIAGNOSIS — I130 Hypertensive heart and chronic kidney disease with heart failure and stage 1 through stage 4 chronic kidney disease, or unspecified chronic kidney disease: Secondary | ICD-10-CM | POA: Insufficient documentation

## 2016-01-11 DIAGNOSIS — K7469 Other cirrhosis of liver: Secondary | ICD-10-CM

## 2016-01-11 DIAGNOSIS — E875 Hyperkalemia: Secondary | ICD-10-CM | POA: Diagnosis not present

## 2016-01-11 DIAGNOSIS — E1122 Type 2 diabetes mellitus with diabetic chronic kidney disease: Secondary | ICD-10-CM | POA: Insufficient documentation

## 2016-01-11 DIAGNOSIS — I2510 Atherosclerotic heart disease of native coronary artery without angina pectoris: Secondary | ICD-10-CM | POA: Diagnosis not present

## 2016-01-11 DIAGNOSIS — G4733 Obstructive sleep apnea (adult) (pediatric): Secondary | ICD-10-CM | POA: Insufficient documentation

## 2016-01-11 DIAGNOSIS — K7030 Alcoholic cirrhosis of liver without ascites: Secondary | ICD-10-CM | POA: Diagnosis not present

## 2016-01-11 DIAGNOSIS — I255 Ischemic cardiomyopathy: Secondary | ICD-10-CM | POA: Insufficient documentation

## 2016-01-11 DIAGNOSIS — R161 Splenomegaly, not elsewhere classified: Secondary | ICD-10-CM | POA: Diagnosis not present

## 2016-01-11 DIAGNOSIS — D696 Thrombocytopenia, unspecified: Secondary | ICD-10-CM | POA: Diagnosis not present

## 2016-01-11 DIAGNOSIS — I5022 Chronic systolic (congestive) heart failure: Secondary | ICD-10-CM | POA: Diagnosis present

## 2016-01-11 DIAGNOSIS — Z8674 Personal history of sudden cardiac arrest: Secondary | ICD-10-CM | POA: Diagnosis not present

## 2016-01-11 DIAGNOSIS — K7460 Unspecified cirrhosis of liver: Secondary | ICD-10-CM | POA: Diagnosis not present

## 2016-01-11 DIAGNOSIS — Z794 Long term (current) use of insulin: Secondary | ICD-10-CM | POA: Insufficient documentation

## 2016-01-11 DIAGNOSIS — Z955 Presence of coronary angioplasty implant and graft: Secondary | ICD-10-CM | POA: Insufficient documentation

## 2016-01-11 DIAGNOSIS — Q231 Congenital insufficiency of aortic valve: Secondary | ICD-10-CM | POA: Insufficient documentation

## 2016-01-11 DIAGNOSIS — E1142 Type 2 diabetes mellitus with diabetic polyneuropathy: Secondary | ICD-10-CM | POA: Diagnosis not present

## 2016-01-11 DIAGNOSIS — I10 Essential (primary) hypertension: Secondary | ICD-10-CM | POA: Diagnosis not present

## 2016-01-11 DIAGNOSIS — N189 Chronic kidney disease, unspecified: Secondary | ICD-10-CM | POA: Insufficient documentation

## 2016-01-11 MED ORDER — LISINOPRIL 5 MG PO TABS: 5.0000 mg | ORAL_TABLET | Freq: Every day | ORAL | Status: DC

## 2016-01-11 MED ORDER — FUROSEMIDE 40 MG PO TABS: ORAL_TABLET | ORAL | Status: DC

## 2016-01-11 NOTE — Patient Instructions (Addendum)
Restart Lisinopril 5 mg daily  Increase Furosemide to 40 mg TWICE A DAY FOR 4 DAYS ONLY, then take 40 mg in AM and 20 mg in PM.  We have sent you in a new prescription for 40 mg tabs  Labs in 1 week  Your physician has requested that you have an echocardiogram. Echocardiography is a painless test that uses sound waves to create images of your heart. It provides your doctor with information about the size and shape of your heart and how well your heart's chambers and valves are working. This procedure takes approximately one hour. There are no restrictions for this procedure.  Your physician recommends that you schedule a follow-up appointment in: 2 weeks

## 2016-01-12 ENCOUNTER — Ambulatory Visit: Payer: 59 | Admitting: Family Medicine

## 2016-01-12 NOTE — Progress Notes (Signed)
Patient ID: [name], male   DOB: 1961-06-03, 55 y.o.   MRN: [id number] PCP: Dr. [name] Cardiology: Dr. Shirlee Latch  55 y.o. with history of CAD s/p recent PCI, cirrhosis with thrombocytopenia, cardiac arrest, and ischemic cardiomyopathy.  Patient was admitted in 7/15-8/15 with gallstone pancreatitis.  It was decided not to do a cholecystectomy due to high surgical risk.  He was sent home and went back to work.  In 10/15, he had a cardiac arrest at work and was defibrillated by AED.  He was cooled and recovered well.  Platelets were initially very low but recovered to his baseline in the 60K range.  Cardiac cath was done showing severe 2 vessel disease.  Cardiac MRI was done, showing EF 40% with a mixed viability picture.  With recovery of platelets, he had Xience DES to the LAD and the distal RCA.  He had acute on chronic systolic CHF with volume overload and was diuresed and eventually discharge.  Unfortunately, he was sent home on daily metolazone and developed AKI.  Diuretics were held and this has gradually improved.  Spironolactone and lisinopril were stopped due to hyperkalemia.  However, he was able to restart lisinopril with simultaneous use of patiromer.  He is now off lisinopril again and is not sure why.  He is still taking patiromer.  He returns today for followup.  He has not been doing well recently.  He fell on his right shoulder and has a lot of pain there.  He continues to be unsteady on his feet at times, likely due to peripheral neuropathy.  He was on light duty at work, now moving back into his regular job.  Under stress.  He gets fatigued easily.  Occasionally gets lightheaded with heavy lifting.  No dyspnea walking on flat ground.  Recently, he has had problems with lower extremity edema.  PCP titrated Lasix up to 40 mg daily.  Weight is up 15 lbs since last appointment in this office.  BP is very high today.   Labs (11/15): K 4.7 => 7.2, creatinine 1.37 => 2.1 => 2.25 with BUN 94, HCT  26.9, plts 67, P2Y12 241 Labs (11/20/14): K 6.2, creatinine 1.1, HCT 33.2, plts 45 Labs (12/18/14): K 6.1 creatinine 1.07 off spiro and lisinopril.  Labs (1/16): K 5.2, creatinine 1.12 Labs (2/16): LDL 59, HDL 41, HCT 32.4 Labs (3/16): K 5.4, creatinine 1.27 Labs (4/16): HCT 32.9, plts 47 Labs (5/16): K 5.2, creatinine 1.23, BNP 352 Labs (6/16): hgb 11.8, plts 41  Labs (7/16): K 4.7, creatinine 1.19 Labs (8/16): K 5, creatinine 1.28, hgb 11.8, plts 41 Labs (9/16): K 5, creatinine 1.39, BNP 321 Labs (1/17): K 5, creatinine 1.6, plts 99K, HCT 37.3  PMH: 1. 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  2. Thrombocytopenia: Chronic.  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   3. Gallstone pancreatitis: He did not have cholecystectomy as deemed too high a surgical risk.  4. CKD 5. 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 Bicuspid aortic valve: Moderate AS with mean gradient 25 mmHg by echo in 4/16.  7. H/o perforated diverticulum.  8. CAD: Cardiac arrest 10/15.  LHC (10/15) with 80-90% proximal ramus stenosis, 90% mLAD, subtotaled distal LAD, 90% D1, 90% dRCA/proximal PDA.  Patient had Xience DES to mLAD, dRCA.  9. Cardiac arrest: 10/15, defibrillated with AED and cooled.  10. Ischemic cardiomyopathy: Echo (8/15) with EF 35-40%, bicuspid aortic valve with mild AS/AI.  Cardiac MRI (10/15) with  EF 40%, regional wall motion abnormalities, normal RV, delayed enhancement pattern suggesting mixed picture of viability. Echo (1/16) with EF 35-40%, hypokinesis of the mid to apical septum and the true apex, bicuspid aortic valve, mild AI.  Echo (4/16) with EF 40-45%, bicuspid aortic valve with moderate AS (mean gradient 25 mmHg) 11. Carotid dopplers (4/16): Mild plaque 12. Lumbar spine degenerative disc disease.  13. Peripheral neuropathy 14. OSA  SH: Never smoked.  Prior ETOH abuse, now stopped.  Works at [employer] and [employer].  Lives with sister in [place].   FH: No premature CAD.   ROS: All systems reviewed and negative except as per HPI.   Current Outpatient Prescriptions  Medication Sig Dispense Refill  . aspirin 81 MG chewable tablet Chew 1 tablet (81 mg total) by mouth daily.    [name] atorvastatin (LIPITOR) 20 MG tablet Take 1 tablet (20 mg total) by mouth daily at 6 PM. 30 tablet 11  . carvedilol (COREG) 25 MG tablet Take 0.5 tablets (12.5 mg total) by mouth 2 (two) times daily. 90 tablet 3  . furosemide (LASIX) 40 MG tablet Take 1 tab in AM and 1/2 tab in PM 45 tablet 3  . gabapentin (NEURONTIN) 300 MG capsule Take 300 mg by mouth at bedtime.  1  . insulin aspart (NOVOLOG) 100 UNIT/ML injection Inject 0-10 Units into the skin 3 (three) times daily before meals. Sliding scale 10 mL 6  . Insulin Degludec (TRESIBA FLEXTOUCH) 100 UNIT/ML SOPN Inject 18 Units into the skin at bedtime. 5 pen 2  . patiromer (VELTASSA) 8.4 G packet Take 1 packet (8.4 g total) by mouth daily. 30 packet   . lisinopril (PRINIVIL,ZESTRIL) 5 MG tablet Take 1 tablet (5 mg total) by mouth daily.     No current facility-administered medications for this encounter.   BP 180/78 mmHg  Pulse 70  Wt 248 lb 8 oz (112.719 kg)  SpO2 99% General: NAD Neck: Thick, JVP difficult, no thyromegaly or thyroid nodule. Left carotid bruit.  Lungs: Clear   CV: Nondisplaced PMI.  Heart regular S1/S2, no S3/S4, 2/6 early SEM RUSB. 1-2+ edema to knees bilaterally.  Normal pedal pulses.  Abdomen: Soft, nontender, no hepatosplenomegaly, no distention.  Skin: Intact without lesions or rashes.  Neurologic: Alert and oriented x 3.  Psych: Normal affect. Extremities: No clubbing or cyanosis.  HEENT: Normal.   Assessment/Plan: 1. Chronic systolic CHF: Echo 03/2015 with EF 40-45%, ischemic cardiomyopathy.  NYHA class II-III symptoms. More volume overloaded than in the past.   - Increase Lasix 40 mg bid x 4 days then 40 qam/20 qpm.  BMET/BNP today and again in 1 week.  - Controlling  hyperkalemia with patiromer 8.4 g/day.  Add back lisinopril 5 mg daily. I am unsure why it was stopped. - Continue Coreg to 25 mg bid.  - Repeat echo to look for any worsening in EF. - Wear graded compression stockings.  2. CAD: Status post Xience DES to LAD and RCA after cardiac arrest.  Now off Plavix. - Continue statin, check lipids today. - Continue ASA 81.  3. Cirrhosis: Likely related to ETOH, no longer drinking.  Has associated splenomegaly and chronic thrombocytopenia.  4. Thrombocytopenia: Chronic, likely due to splenomegaly. Will need to follow plts/hemoglobin over time closely.     5. DM: Per endocrinology.  6. Hyperkalemia: Has tolerated ACEI with use of patiromer.   7. Carotid bruit: On left, carotid dopplers with mild plaque. Will need follow up in 4/18.  8.  Imbalance: May be related to diabetic neuropathy, also has history of L-spine disease. Overall doing better but still with some unsteadiness.  9. OSA: Moderate.  CPAP titration scheduled.  10. HTN: BP is high today. +Stress, also off lisinopril.  Planning to increase Lasix and add back lisinopril 5 mg daily.   Followup in 2 wks for re-evaluation.   Marca Ancona 01/12/2016

## 2016-01-13 ENCOUNTER — Telehealth (HOSPITAL_COMMUNITY): Payer: Self-pay | Admitting: *Deleted

## 2016-01-13 MED ORDER — FUROSEMIDE 40 MG PO TABS: 40.0000 mg | ORAL_TABLET | Freq: Every day | ORAL | Status: DC

## 2016-01-13 NOTE — Telephone Encounter (Signed)
Spoke with pt he is aware of medication changes and will come in Monday for lab work. Medication updated in pts chart.

## 2016-01-13 NOTE — Telephone Encounter (Signed)
He's back on lisinopril also.  Can hold Lasix x 1 day then decrease back to 40 mg once daily. He will need BMET and BNP to be done this week.

## 2016-01-13 NOTE — Telephone Encounter (Signed)
Left vm for pt to callback 

## 2016-01-13 NOTE — Telephone Encounter (Signed)
Pt called and stated he almost passed out in cardiac rehab and he is very tired since med adjustments at his last office visit.  He said his lasix was increased at his during the last visit and wants to know if he should lower his medication. Please advise.

## 2016-01-15 ENCOUNTER — Telehealth (HOSPITAL_COMMUNITY): Payer: Self-pay | Admitting: Cardiology

## 2016-01-15 ENCOUNTER — Ambulatory Visit (HOSPITAL_BASED_OUTPATIENT_CLINIC_OR_DEPARTMENT_OTHER): Payer: 59 | Attending: Cardiology

## 2016-01-15 DIAGNOSIS — Z79899 Other long term (current) drug therapy: Secondary | ICD-10-CM | POA: Insufficient documentation

## 2016-01-15 DIAGNOSIS — R0683 Snoring: Secondary | ICD-10-CM | POA: Insufficient documentation

## 2016-01-15 DIAGNOSIS — G4733 Obstructive sleep apnea (adult) (pediatric): Secondary | ICD-10-CM | POA: Insufficient documentation

## 2016-01-15 MED ORDER — NITROGLYCERIN 0.4 MG SL SUBL: 0.4000 mg | SUBLINGUAL_TABLET | SUBLINGUAL | Status: DC | PRN

## 2016-01-15 NOTE — Telephone Encounter (Signed)
Patient reports he dropped his vial of nitro in the parking lot Would like to have updated rx sent into pharmacy

## 2016-01-18 ENCOUNTER — Ambulatory Visit (HOSPITAL_COMMUNITY)
Admission: RE | Admit: 2016-01-18 | Discharge: 2016-01-18 | Disposition: A | Discharge status: Home / Self Care | Payer: 59 | Source: Ambulatory Visit | Attending: Cardiology | Admitting: Cardiology

## 2016-01-18 ENCOUNTER — Ambulatory Visit (HOSPITAL_BASED_OUTPATIENT_CLINIC_OR_DEPARTMENT_OTHER)
Admission: RE | Admit: 2016-01-18 | Discharge: 2016-01-18 | Disposition: A | Discharge status: Home / Self Care | Payer: 59 | Source: Ambulatory Visit | Attending: Cardiology | Admitting: Cardiology

## 2016-01-18 ENCOUNTER — Telehealth (HOSPITAL_COMMUNITY): Payer: Self-pay

## 2016-01-18 DIAGNOSIS — G4733 Obstructive sleep apnea (adult) (pediatric): Secondary | ICD-10-CM | POA: Diagnosis not present

## 2016-01-18 DIAGNOSIS — E119 Type 2 diabetes mellitus without complications: Secondary | ICD-10-CM | POA: Insufficient documentation

## 2016-01-18 DIAGNOSIS — I059 Rheumatic mitral valve disease, unspecified: Secondary | ICD-10-CM | POA: Insufficient documentation

## 2016-01-18 DIAGNOSIS — I5022 Chronic systolic (congestive) heart failure: Secondary | ICD-10-CM | POA: Diagnosis not present

## 2016-01-18 DIAGNOSIS — I10 Essential (primary) hypertension: Secondary | ICD-10-CM | POA: Diagnosis not present

## 2016-01-18 DIAGNOSIS — I517 Cardiomegaly: Secondary | ICD-10-CM | POA: Insufficient documentation

## 2016-01-18 DIAGNOSIS — I509 Heart failure, unspecified: Secondary | ICD-10-CM | POA: Diagnosis present

## 2016-01-18 LAB — BASIC METABOLIC PANEL
Anion gap: 7 (ref 5–15)
BUN: 42 mg/dL — AB (ref 6–20)
CHLORIDE: 105 mmol/L (ref 101–111)
CO2: 27 mmol/L (ref 22–32)
CREATININE: 1.98 mg/dL — AB (ref 0.61–1.24)
Calcium: 8.2 mg/dL — ABNORMAL LOW (ref 8.9–10.3)
GFR calc Af Amer: 43 mL/min — ABNORMAL LOW (ref >60)
GFR calc non Af Amer: 37 mL/min — ABNORMAL LOW (ref >60)
Glucose, Bld: 385 mg/dL — ABNORMAL HIGH (ref 65–99)
Potassium: 5.4 mmol/L — ABNORMAL HIGH (ref 3.5–5.1)
Sodium: 139 mmol/L (ref 135–145)

## 2016-01-18 NOTE — Telephone Encounter (Signed)
Would not change Lasix.  Keep as planned at office visit.  Can cut lisinopril down to 2.5 mg daily.

## 2016-01-18 NOTE — Progress Notes (Signed)
  Echocardiogram 2D Echocardiogram has been performed.  Arvil Chaco 01/18/2016, 1:44 PM

## 2016-01-18 NOTE — Telephone Encounter (Signed)
Patient said he did what was advised from our office and held his Lasix 40 mg on Thursday 1/26, resuming Lasix 40 mg on Friday. Today while at rehab feels wiped out again at 1 1/2 hours into rehab  1/30 BP at Rehab 90/50  95/60 HR at rest 66 1/25 BP at Rehab 100/70  He has taken his Lasix 40 mg dose today.  Will be at clinic to have labs drawn today. He weighs self daily at work but does not feel weight is true.  Weight he said was 241 lb. States his leg swelling has gone down  Patient is going to hold his Lasix tomorrow. Take in AM and have BP rechecked at rehab and will call to let office know how he is feeling  Has BP taken  when he works.  Will bring those readings in with next office visit

## 2016-01-18 NOTE — Telephone Encounter (Signed)
Pt notified to continue lasix and change lisinopril to 2.5 mg daily during lab appointment

## 2016-01-19 ENCOUNTER — Telehealth: Payer: Self-pay | Admitting: Cardiology

## 2016-01-19 NOTE — Telephone Encounter (Signed)
Pt had successful PAP titration. Please setup appointment in 10 weeks. Please let [facility] know that order for PAP is in EPIC.   

## 2016-01-19 NOTE — Sleep Study (Signed)
   Patient Name: [name], [name] MRN: [id number] Study Date: 01/15/2016 Gender: Male D.O.B: [birth year] Age (years): 59 Referring Provider: Marca Ancona Interpreting Physician: Armanda Magic MD, ABSM RPSGT: Celene Kras  Weight (lbs): 234 BMI: 34 Height (inches): 70 Neck Size: 18.00  CLINICAL INFORMATION The patient is referred for a BiPAP titration to treat sleep apnea. Date of NPSG, Split Night or HST:11/14/2105  SLEEP STUDY TECHNIQUE As per the AASM Manual for the Scoring of Sleep and Associated Events v2.3 (April 2016) with a hypopnea requiring 4% desaturations. The channels recorded and monitored were frontal, central and occipital EEG, electrooculogram (EOG), submentalis EMG (chin), nasal and oral airflow, thoracic and abdominal wall motion, anterior tibialis EMG, snore microphone, electrocardiogram, and pulse oximetry. Bilevel positive airway pressure (BPAP) was initiated at the beginning of the study and titrated to treat sleep-disordered breathing.  MEDICATIONS Medications administered by patient during sleep study : No sleep medicine administered.  Home medications:  ASA, Atorvastatin, Coreg, Lasix, Gabapentin, Imdur, Lisinopril, Veltassa  RESPIRATORY PARAMETERS Optimal IPAP Pressure (cm):17  AHI at Optimal Pressure (/hr) 1.6 Optimal EPAP Pressure (cm):13   Overall Minimal O2 (%):82.00  Minimal O2 at Optimal Pressure (%):93.0  SLEEP ARCHITECTURE Start Time:10:28:45 PM Stop Time:4:48:41 AM Total Time (min):379.9 Total Sleep Time (min):266.0 Sleep Latency (min):10.1 Sleep Efficiency (%):70.0 REM Latency (min):114.0 WASO (min):103.9 Stage N1 (%):4.14 Stage N2 (%):79.89 Stage N3 (%):0.00 Stage R (%):15.98 Supine (%):44.17 Arousal Index (/hr):10.8      CARDIAC DATA The 2 lead EKG demonstrated sinus rhythm. The mean heart rate was 59.66 beats per minute. Other EKG findings include: None.  LEG MOVEMENT DATA The total Periodic Limb Movements of Sleep (PLMS) were 99.  The PLMS index was 22.33. A PLMS index of <15 is considered normal in adults.  IMPRESSIONS - An optimal PAP pressure was selected for this patient ( 17/13 cm of water) - Central sleep apnea was not noted during this titration (CAI = 0.0/h). - Severe oxygen desaturations were observed during this titration (min O2 = 82.00%).  Time spent with oxygen saturations < 88% was 1.8 minutes. - The patient snored with Loud snoring volume. - No cardiac abnormalities were observed during this study. - Mild periodic limb movements were observed during this study. Arousals associated with PLMs were rare.  DIAGNOSIS - Obstructive Sleep Apnea (327.23 [G47.33 ICD-10])  RECOMMENDATIONS - Trial of BiPAP therapy on 17/13 cm H2O with a Medium size Fisher&Paykel Full Face Mask Simplus mask and heated humidification. - Avoid alcohol, sedatives and other CNS depressants that may worsen sleep apnea and disrupt normal sleep architecture. - Sleep hygiene should be reviewed to assess factors that may improve sleep quality. - Weight management and regular exercise should be initiated or continued. - Return to Sleep Center for re-evaluation after 10 weeks of therapy    Quintella Reichert Diplomate, American Board of Sleep Medicine  ELECTRONICALLY SIGNED ON:  01/19/2016, 11:04 PM [facility] PH: [phone]   FX: [phone] ACCREDITED BY THE AMERICAN ACADEMY OF SLEEP MEDICINE

## 2016-01-19 NOTE — Addendum Note (Signed)
Addended by: Armanda Magic R on: 01/19/2016 11:11 PM   Modules accepted: Orders

## 2016-01-22 ENCOUNTER — Ambulatory Visit (INDEPENDENT_AMBULATORY_CARE_PROVIDER_SITE_OTHER): Payer: 59 | Admitting: Neurology

## 2016-01-22 ENCOUNTER — Encounter: Payer: Self-pay | Admitting: Neurology

## 2016-01-22 ENCOUNTER — Other Ambulatory Visit: Payer: 59

## 2016-01-22 VITALS — BP 170/84 | HR 70 | Wt 247.5 lb

## 2016-01-22 DIAGNOSIS — I739 Peripheral vascular disease, unspecified: Secondary | ICD-10-CM | POA: Diagnosis not present

## 2016-01-22 DIAGNOSIS — E1142 Type 2 diabetes mellitus with diabetic polyneuropathy: Secondary | ICD-10-CM | POA: Diagnosis not present

## 2016-01-22 DIAGNOSIS — R202 Paresthesia of skin: Secondary | ICD-10-CM

## 2016-01-22 DIAGNOSIS — R2681 Unsteadiness on feet: Secondary | ICD-10-CM | POA: Diagnosis not present

## 2016-01-22 DIAGNOSIS — J069 Acute upper respiratory infection, unspecified: Secondary | ICD-10-CM

## 2016-01-22 DIAGNOSIS — R29898 Other symptoms and signs involving the musculoskeletal system: Secondary | ICD-10-CM

## 2016-01-22 NOTE — Patient Instructions (Addendum)
1.  NCS/EMG of the left arm and leg 2.  We will order arterial ultrasound of the legs 3.  Lab testing today 4.  We will call you with the results 5.  Continue physical therapy  Return to clinic in 3 months

## 2016-01-22 NOTE — Telephone Encounter (Signed)
Left detailed message on private voicemail that [facility] would be contacting him to set up CPAP.  [facility] has been notified of orders.

## 2016-01-22 NOTE — Progress Notes (Addendum)
Follow-up Visit   Date: 01/22/2016    [name] MRN: [id number] DOB: 05-17-1961   Interim History: [name] is a 55 y.o. 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  The patient was accompanied to the clinic by self.  History of present illness: He has a complex neurological history, which started in May 2012 when slipped and fell and developed shooting pain in his back, but did not experience any weakness, numbness, or gait problems. In September 2012, he was asked to be seen by Dr. Modesto Charon in our practice in 2012 for acute onset of urinary retention and bowel incontinence, concerning for cauda equina syndrome. He underwent emergent MRI lumbar spine which showed multilevel degenerative changes, without evidence of nerve impingement. This was ultimately found to be due to urosepsis for which he was hospitalized and treated with IV antibiotics.   Around the same time, he began experiencing numbness and weakness of his legs bilaterally, but much worse on the left side. With the relative subacute onset of symptoms and concern for demyelinating neuropathy, he underwent CSF testing which was reportedly normal. He was then referred to [facility] where EMG showed severe sensorimotor polyneuropathy. Of note, there was left hip flexion weakness and asymmetric sensory findings during his neurological evaluation in 2013.   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 He has chronic left ankle instability and has been wearing an ankle brace which helps. He has fallen a few times, mostly because of tripping on objects and loosing his balance. He walks unassisted and lives in a one-story home with his sister and handicapped brother.   He  works for [employer] at [employer] and had a fall in October 2016 after tripping on a hose. He is seeing a Personnel officer and is on light duty, lifting no more than 10lb. 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 He is concerned about work safety. He endorses low back pain. He completed physical therapy several weeks ago, which helped his strength. He has been complaint with home exercises. 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   UPDATE 01/22/2016:   He is going to PT for 4 hours twice per week and gets very tired within a few hours. He does not have slurred speech, double vision, or shortness of breath.  If he rests for about 10-15 minutes, he is able to get back to his activity. He has not had any falls since his last visit, but still stumbles often.   Since his last visit, he has new complaints of left hand paresthesias over the 4-5th digits, which is especially worse when his blood pressure is checked.  Today, he is also complaining of cough, congestion, and runny nose.   MRI lumbar spine was ordered for his left leg weakness which showed mild multilevel disc bulge and mild mass effect on the right S1 nerve root, which would not explain left leg weakness.   Medications:  Current Outpatient Prescriptions on File Prior to Visit  Medication Sig Dispense Refill  . aspirin 81 MG chewable tablet Chew 1 tablet (81 mg total) by mouth daily.    [name] atorvastatin (LIPITOR) 20  MG tablet Take 1 tablet (20 mg total) by mouth daily at 6 PM. 30 tablet 11  . carvedilol (COREG) 25 MG tablet Take 0.5 tablets (12.5 mg total) by mouth 2 (two) times daily. 90 tablet 3  . furosemide (LASIX) 40 MG tablet Take 1 tablet (40 mg total) by mouth daily. 30 tablet 3  . gabapentin (NEURONTIN) 300 MG capsule Take 300 mg by mouth at bedtime.  1  . insulin aspart (NOVOLOG) 100 UNIT/ML injection Inject 0-10  Units into the skin 3 (three) times daily before meals. Sliding scale 10 mL 6  . Insulin Degludec (TRESIBA FLEXTOUCH) 100 UNIT/ML SOPN Inject 18 Units into the skin at bedtime. 5 pen 2  . lisinopril (PRINIVIL,ZESTRIL) 5 MG tablet Take 1 tablet (5 mg total) by mouth daily.    . nitroGLYCERIN (NITROSTAT) 0.4 MG SL tablet Place 1 tablet (0.4 mg total) under the tongue every 5 (five) minutes as needed for chest pain. 90 tablet 3  . patiromer (VELTASSA) 8.4 G packet Take 1 packet (8.4 g total) by mouth daily. 30 packet    No current facility-administered medications on file prior to visit.    Allergies:  Allergies  Allergen Reactions  . Iodine Other (See Comments)    Family history of anaphylaxis, unsure if patient has reaction  . Other Shortness Of Breath, Itching and Other (See Comments)    PERFUMES - SNEEZING  . Hydralazine Hcl Hives  . Naproxen Nausea And Vomiting    Review of Systems:  CONSTITUTIONAL: No fevers, chills, night sweats, or weight loss.  EYES: No visual changes or eye pain ENT: No hearing changes.  No history of nose bleeds.   RESPIRATORY: No cough, wheezing and shortness of breath.   CARDIOVASCULAR: Negative for chest pain, and palpitations.   GI: Negative for abdominal discomfort, blood in stools or black stools.  No recent change in bowel habits.   GU:  No history of incontinence.   MUSCLOSKELETAL: No history of joint pain or swelling.  No myalgias.   SKIN: Negative for lesions, rash, and itching.   ENDOCRINE: Negative for cold or heat intolerance, polydipsia or goiter.   PSYCH:  + depression or anxiety symptoms.   NEURO: As Above.   Vital Signs:  BP 170/84 mmHg  Pulse 70  Wt 247 lb 8 oz (112.265 kg)  SpO2 97%  Neurological Exam: MENTAL STATUS including orientation to time, place, person, recent and remote memory, attention span and concentration, language, and fund of knowledge is normal.  Speech is not dysarthric.  CRANIAL NERVES:  No visual field  defects. Surgical pupils bilaterally, reactive to light.  Limited vertical gaze bilaterally, otherwise normal conjugate, extra-ocular eye movements in all directions of gaze.  No ptosis. Normal facial sensation.  Face is symmetric. Palate elevates symmetrically.  Tongue is midline.  MOTOR:  Mild distal hand and leg atrophy.  No abnormal movements.   Right Upper Extremity:    Left Upper Extremity:    Deltoid  5/5   Deltoid  5/5   Biceps  5/5   Biceps  5/5   Triceps  5/5   Triceps  5/5   Wrist extensors  5/5   Wrist extensors  5/5   Wrist flexors  5/5   Wrist flexors  5/5   Finger extensors  5/5   Finger extensors  5/5   Finger flexors  5/5   Finger flexors  5/5   Dorsal interossei  5-/5   Dorsal interossei  5-/5   Abductor  pollicis  5/5   Abductor pollicis  5/5   Tone (Ashworth scale)  0  Tone (Ashworth scale)  0   Right Lower Extremity:    Left Lower Extremity:    Hip flexors  5/5   Hip flexors  5/5   Hip extensors  5/5   Hip extensors  5/5   Knee flexors  5/5   Knee flexors  5/5   Knee extensors  5/5   Knee extensors  5/5   Dorsiflexors  5/5   Dorsiflexors  5/5   Plantarflexors  5/5   Plantarflexors  5/5   Toe extensors  5-/5   Toe extensors  5-/5   Toe flexors  5-/5   Toe flexors  5-/5   Tone (Ashworth scale)  0  Tone (Ashworth scale)  0   MSRs:  Reflexes are 2+/4 throughout, except absent Achilles bilaterally.  SENSORY: Reduced sensation to all modalities below the knees bilaterally, worse on the left. Proprioception is intact on the right. Sensation intact in the upper extremities. There is moderate sway with Rhomberg testing.  COORDINATION/GAIT:  Normal finger-to- nose-finger and heel-to-shin  Gait slightly wide-based, unassisted, and stable.   Data: MRI lumbar spine 08/25/2011: 1. No evidence of compression of the cauda equina. 2. Multilevel degenerative facet joint disease. 3. Small disc protrusion at L5-S1 to the right without significant Impingement.  MRI lumbar  spine wo contrast 10/21/2015: 1. At L5-S1 there is a broad right paracentral disc protrusion with mild mass effect on the right intraspinal S1 nerve root. Mild bilateral facet arthropathy. Mild right foraminal narrowing. No left foraminal narrowing. 2. At L4-5 there is a mild broad-based disc bulge eccentric towards the left. Severe bilateral facet arthropathy. Mild left foraminal stenosis. 3. At L3-4 there is a mild broad-based disc bulge with a broad central disc protrusion. Mild left lateral recess stenosis. Mild bilateral facet arthropathy.  CSF 09/14/2011: R0 W0 - protein and glucose results not found, ACE 2, VDRL NR, no OCB Labs 08/2011: SPEP with IFE no M protein, RF < 10 Labs 05/22/2015:  HbA1c 6.4, TSH 1.9, vitamin B12 310 Labs 10/07/2015:  Vitamin B12 377, vitamin B1 9, vitamin B6 4.6, copper 109, zinc 64  IMPRESSION/PLAN: 1.  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 Clinically symptoms have been progressively worsening over the years.  He has extensive testing in the past including NCS/EMG x 2 and CSF testing which did not show elevated protein.   2.  Left hand paresthesias, ?ulnar neuropathy vs cervical radiculopathy.  Proceed with NCS/EMG of the left arm and leg to see if this is progression of neuropathy vs entrapment or radicular.  Based on the the results, may consider imaging the cervical spine.   3.  Multifactorial gait instability.  Proprioceptive loss is contributing to his gait ataxia and he is already doing PT which is helping is strength but no significant improvement in balance.  Some of his imbalance may be stemming from ankle instability.  MRI lumbar spine was reviewed and shows shows mild multilevel disc bulge and mild mass effect on the RIGHT S1 nerve root, which would not explain LEFT leg weakness, however, his left leg weakness has improved since his last exam.  4.  Bilateral leg weakness  and fatigue suggestive of claudication.  Imaging of his lumbar spine does not show severe foraminal stenosis to point to neurogenic claudications, so I will order vascular studies of the legs.  Check AChR antibodies, but overall suspicion for myasthenia is low.   5.  Upper respiratory infection, likely viral.  Recommend treat symptomatically, if no improvement, follow-up with PCP.  Return to clinic in 2 months   The duration of this appointment visit was 40 minutes of face-to-face time with the patient.  Greater than 50% of this time was spent in counseling, explanation of diagnosis, planning of further management, and coordination of care.   Thank you for allowing me to participate in patient's care.  If I can answer any additional questions, I would be pleased to do so.    Sincerely,    Gissela Bloch K. Allena Katz, DO

## 2016-01-25 ENCOUNTER — Other Ambulatory Visit (HOSPITAL_COMMUNITY): Payer: Self-pay | Admitting: Cardiology

## 2016-01-26 ENCOUNTER — Other Ambulatory Visit (HOSPITAL_COMMUNITY): Payer: Self-pay | Admitting: *Deleted

## 2016-01-26 ENCOUNTER — Telehealth: Payer: Self-pay | Admitting: *Deleted

## 2016-01-26 NOTE — Telephone Encounter (Signed)
Left message for patient to call me back.  ABI is scheduled for February 15 at 12:30.

## 2016-01-27 ENCOUNTER — Encounter: Payer: Self-pay | Admitting: Family Medicine

## 2016-01-27 ENCOUNTER — Ambulatory Visit (INDEPENDENT_AMBULATORY_CARE_PROVIDER_SITE_OTHER): Payer: 59 | Admitting: Family Medicine

## 2016-01-27 VITALS — BP 164/71 | HR 62 | Temp 98.5°F | Resp 20 | Wt 230.8 lb

## 2016-01-27 DIAGNOSIS — J0100 Acute maxillary sinusitis, unspecified: Secondary | ICD-10-CM | POA: Insufficient documentation

## 2016-01-27 MED ORDER — LEVOFLOXACIN 250 MG PO TABS: ORAL_TABLET | ORAL | Status: DC

## 2016-01-27 MED ORDER — ONDANSETRON HCL 4 MG PO TABS: 4.0000 mg | ORAL_TABLET | Freq: Three times a day (TID) | ORAL | Status: DC | PRN

## 2016-01-27 NOTE — Patient Instructions (Signed)
levaquin 2 tabs first day, then 1 tab for 6 days.  I have also called in zofran, this should be taken 30 minutes prior to eating and if nausea/vomit.  If worsening go to ED for evaluation, watch for signs of dehydration and worsening fatigue/cough.  Follow up 1 week with PCP

## 2016-01-27 NOTE — Progress Notes (Signed)
[name] , 1961/12/04, 55 y.o., male MRN: [id number]  CC: Cough Subjective: Pt presents for an acute OV with complaints of 5 day history of cough, diarrhea, vomit x1, nasal congestion, rhinorrhea, bleeding nose, fever and chills. He is tolerating liquids and medicines, but has a decreased appetite. Associated symptoms include difficulty sleeping. Fever/chills Sunday and Monday (Tmax 101F). Pt has tried vit c, hot tea and lemon to ease their symptoms. Pt has taken tylenol and an antidiarrheal pill as well. Last diarrhea yesterday, no stool yet today.  He has recently seen his cardiologist which took him off a few of his blood pressure medicines, secondary to elevated potassium. He has evaluation follow-up with him in 2 days. Patient does admit to a dry weight of 220 pounds, currently 230 pounds today. He reports recent increase in lower extremity edema as well, he states he increased his Lasix which has improved the symptoms. Per electronic medical record patient does have low platelets and low albumin, with cirrhosis. Last GFR 37, with a creatinine of 198 which is mildly elevated from his baseline which is approximately 1.4. Flu shot UTD, tdap UTD, PNA23 UTD   Allergies  Allergen Reactions  . Iodine Other (See Comments)    Family history of anaphylaxis, unsure if patient has reaction  . Other Shortness Of Breath, Itching and Other (See Comments)    PERFUMES - SNEEZING  . Hydralazine Hcl Hives  . Naproxen Nausea And Vomiting   Social History  Substance Use Topics  . Smoking status: Never Smoker   . Smokeless tobacco: Current User    Types: Chew     Comment: occasional 1 can per 2 weeks  . Alcohol Use: No     Comment: Quit drinking alcohol 08/20/2011   Past Medical History  Diagnosis Date  . Heart murmur     ECHO 07/2011 showed mild AS and LVH.  TEE 09/2011 showed small PFO, bicuspid aortic valve but no stenosis or regurg.  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  Gen surg and GI recommended flagyl and cipro x 2 wks..  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  . Cholelithiasis     u/s--no cholecystitis.  06/2014 u/s showed no stones, only GB sludge  . Peripheral neuropathy (HCC)     Diabetic:  Autonomic (pelvic) and LE polyneuropathy--[facility] testing showed that it is likely from DM.  Followed by Dr. Allena Katz with [place] neuro as of 09/2015  . Diabetic foot ulcers (HCC)     Poor healing; normal ABIs and TBIs  . History of pyelonephritis   . Type II or unspecified type diabetes mellitus with neurological manifestations, not stated as uncontrolled     IDDM: neuro, renal, and ophth complications  . Wears dentures     upper  . Osteomyelitis of toe of left foot (HCC)     left 4th and 5th toes--now s/p amputation of these areas  . Diabetic retinopathy associated with type 2 diabetes mellitus (HCC)     Laser tx in both eyes (Dr. Allyne Gee): severe nonprolif diab retpthy, diabetic macular edema (05/2015)  . Diabetic nephropathy (HCC)     Proteinuria and CrCl 55-65  . Chronic renal insufficiency, stage III (moderate)     CrCl 50s-80s 2014 to pre-VFIB arrest 10/02/14, at which time he sustained AKI.  . Lumbar spondylosis  MRI 08/25/11 with multilevel facet dz, small disc protrusion at L5-S1 to the right without impingement; repeat L spine MRI 09/2015 same except L5-S1 disc bulge causing some mass effect on R intraspinal S1 nerve root.  No canal stenosis.  . Ischemic cardiomyopathy 07/22/14    Myoview w/out ischemia but large scar (inferior/apex) w/ global hypokinesis--medical mgmt  . History of pancreatitis 2014 and 2015    Suspected biliary: passing small gallstones: surgery declined to remove GB 07/2014.  . Aspiration pneumonia (HCC)   . Thrombocytopenia (HCC)   . Portal hypertension (HCC)   . Liver cirrhosis  secondary to NASH     ?+ alcohol?.  With hx of splenomegaly and ascities (dx approx 2012)  . History of gram negative sepsis 2015    e coli  . Ventricular fibrillation (HCC) 10/02/14    Resuscitated, hospitalized, 3 V CAD detected, DES stent to mid LAD and to RCA were placed.  . Peripheral edema     chronic asymmetric LE edema (L>>R)  . OSA (obstructive sleep apnea) 11/15/2015    Moderate with AHI 16/hr  . Hyperkalemia     cannot use ACE-I/ARB's/aldactone due to this.  Pt on patiromer Lelon Perla) --to help him excrete more potassium in feces  . Diabetic neuropathy with neurologic complication (HCC)     Large fiber, L>R; affecting sensation and proprioception   Past Surgical History  Procedure Laterality Date  . Knee arthroscopy  2001    Bilateral  . Amputation  10/24/2012    Procedure: AMPUTATION RAY;  Surgeon: Sherri Rad, MD;  Location: [facility];  Service: Orthopedics;  Laterality: Left;  left 4th toe amputation through MTP joint, 5th Ray amputation   . Knee surgery  2001  . Hida scan  06/2014    Normal  . Transthoracic echocardiogram  07/19/14;12/2014;03/2015    Mild LV dilation and hypertrophy, EF 35-40%, no wall motion abnormalities, grade II diast dysfxn, bicuspid aortic valve with mild AS and mild AI.  No change on 12/2014 echo.  03/2015 EF improved to 40-45%, moderate AS/bicuspid aortic valve  . Cardiovascular stress test  07/2014    Myoview w/out ischemia but large scar (inferior/apex) w/ global hypokinesis--medical mgmt  . Eye surgery  08/2014    Retinal hemorrhage evacuation  . Coronary angioplasty with stent placement  09/2014    DES to mid LAD and RCA; EF 40% by cardiac MRI  . Cardiac mri  09/2014    Moderately dilated LV.  EF 40%, multiple wall motion abnormalities, normal RV fxn.  Some areas of scarring.  . Left heart catheterization with coronary angiogram N/A 10/10/2014    Procedure: LEFT HEART CATHETERIZATION WITH CORONARY ANGIOGRAM;  Surgeon: Peter M  Sw[place]iland, MD;  Location[facility] C[facility] LAB;  Service: Cardiovascular;  Laterality: N/A;  . Left heart catheterization with coronary angiogram N/A 10/15/2014    Procedure: LEFT HEART CATHETERIZATION WITH CORONARY ANGIOGRAM;  Surgeon: Iran Ouch, MD;  Location: [facility] CATH LAB;  Service: Cardiovascular;  Laterality: N/A;  . Carotid dopplers  04/10/15    1-35% bilat; normal subclavians  . Venous doppler ultrasound Left 01/06/16    NO DVT    Family History  Problem Relation Age of Onset  . Heart disease Father     Deceased  . Diabetes type II Sister   . Other Mother     Septicemia     Medication List       This list is accurate as of: 01/27/16  9:59 AM.  Always use your most recent med list.               aspirin 81 MG chewable tablet  Chew 1 tablet (81 mg total) by mouth daily.     atorvastatin 20 MG tablet  Commonly known as:  LIPITOR  Take 1 tablet (20 mg total) by mouth daily at 6 PM.     carvedilol 25 MG tablet  Commonly known as:  COREG  Take 0.5 tablets (12.5 mg total) by mouth 2 (two) times daily.     furosemide 40 MG tablet  Commonly known as:  LASIX  Take 1 tablet (40 mg total) by mouth daily.     gabapentin 300 MG capsule  Commonly known as:  NEURONTIN  Take 300 mg by mouth at bedtime.     insulin aspart 100 UNIT/ML injection  Commonly known as:  novoLOG  Inject 0-10 Units into the skin 3 (three) times daily before meals. Sliding scale     Insulin Degludec 100 UNIT/ML Sopn  Commonly known as:  TRESIBA FLEXTOUCH  Inject 18 Units into the skin at bedtime.     nitroGLYCERIN 0.4 MG SL tablet  Commonly known as:  NITROSTAT  Place 1 tablet (0.4 mg total) under the tongue every 5 (five) minutes as needed for chest pain.       ROS: Negative, with the exception of above mentioned in HPI  Objective:  BP 164/71 mmHg  Pulse 62  Temp(Src) 98.5 F (36.9 C) (Oral)  Resp 20  Wt 230 lb 12 oz (104.668 kg)  SpO2 96% Body mass index is 33.11 kg/(m^2). Gen: Afebrile. No  acute distress. Toxic in appearance, well-developed, well-nourished, Caucasian male. HENT: AT. [place]. Bilateral TM visualized and normal in appearance. MMM, no oral lesions. Bilateral nares erythema, dry blood and swelling. Throat without erythema or exudates. Mouth cough present on exam. No hoarseness on exam. Tenderness to palpation bilateral maxillary sinus exam. Eyes:Pupils Equal Round Reactive to light, Extraocular movements intact,  Conjunctiva without redness, discharge or icterus. Neck/lymp/endocrine: Supple, shotty bilateral anterior cervical lymphadenopathy CV: RRR, +1 edema bilateral lower extremity Chest: CTAB, no wheeze or crackles. Good air movement, normal resp effort.  Abd: Soft. Obese. NTND. BS present. Skin: No rashes, purpura or petechiae.  Neuro: *Normal gait. PERLA. EOMi. Alert. Oriented x3   Assessment/Plan: CARMON SAHLI is a 55 y.o. male present for acute OV  1. Acute maxillary sinusitis, recurrence not specified - levofloxacin (LEVAQUIN) 250 MG tablet; 500 mg day 1, then 250 mg for 6 days  Dispense: 8 tablet; Refill: 0 - ondansetron (ZOFRAN) 4 MG tablet; Take 1 tablet (4 mg total) by mouth every 8 (eight) hours as needed for nausea or vomiting.  Dispense: 20 tablet; Refill: 0 - Patient encouraged call cardiology for advice on Lasix use if not tolerating by mouth. Zofran was prescribed to him today to help him tolerate food and drink. Patient was encouraged to make sure he is maintaining hydration without overloading. Patient is to monitor his blood sugars closely. - Treat with renally dosed Levaquin  F/u with PCP in 1 week.    Renee Claiborne Billings, DO  [place] [place]

## 2016-01-28 LAB — MYASTHENIA GRAVIS PANEL 2
ACETYLCHOLINE REC MOD AB: 6 %{inhibition}
Acetylcholine Rec Binding: <0.3 nmol/L

## 2016-01-29 ENCOUNTER — Encounter (HOSPITAL_COMMUNITY): Payer: Self-pay

## 2016-01-29 ENCOUNTER — Telehealth: Payer: Self-pay | Admitting: *Deleted

## 2016-01-29 ENCOUNTER — Ambulatory Visit (HOSPITAL_COMMUNITY)
Admission: RE | Admit: 2016-01-29 | Discharge: 2016-01-29 | Disposition: A | Discharge status: Home / Self Care | Payer: 59 | Source: Ambulatory Visit | Attending: Cardiology | Admitting: Cardiology

## 2016-01-29 VITALS — BP 140/70 | HR 68 | Resp 18 | Wt 238.2 lb

## 2016-01-29 DIAGNOSIS — I2510 Atherosclerotic heart disease of native coronary artery without angina pectoris: Secondary | ICD-10-CM | POA: Diagnosis not present

## 2016-01-29 DIAGNOSIS — Z8674 Personal history of sudden cardiac arrest: Secondary | ICD-10-CM | POA: Diagnosis not present

## 2016-01-29 DIAGNOSIS — I255 Ischemic cardiomyopathy: Secondary | ICD-10-CM | POA: Insufficient documentation

## 2016-01-29 DIAGNOSIS — R2689 Other abnormalities of gait and mobility: Secondary | ICD-10-CM | POA: Diagnosis not present

## 2016-01-29 DIAGNOSIS — I6522 Occlusion and stenosis of left carotid artery: Secondary | ICD-10-CM | POA: Insufficient documentation

## 2016-01-29 DIAGNOSIS — G4733 Obstructive sleep apnea (adult) (pediatric): Secondary | ICD-10-CM | POA: Diagnosis not present

## 2016-01-29 DIAGNOSIS — Z794 Long term (current) use of insulin: Secondary | ICD-10-CM | POA: Insufficient documentation

## 2016-01-29 DIAGNOSIS — Z79899 Other long term (current) drug therapy: Secondary | ICD-10-CM | POA: Insufficient documentation

## 2016-01-29 DIAGNOSIS — R161 Splenomegaly, not elsewhere classified: Secondary | ICD-10-CM | POA: Diagnosis not present

## 2016-01-29 DIAGNOSIS — K7460 Unspecified cirrhosis of liver: Secondary | ICD-10-CM | POA: Diagnosis not present

## 2016-01-29 DIAGNOSIS — D696 Thrombocytopenia, unspecified: Secondary | ICD-10-CM | POA: Diagnosis not present

## 2016-01-29 DIAGNOSIS — M25519 Pain in unspecified shoulder: Secondary | ICD-10-CM | POA: Diagnosis not present

## 2016-01-29 DIAGNOSIS — Z955 Presence of coronary angioplasty implant and graft: Secondary | ICD-10-CM | POA: Diagnosis not present

## 2016-01-29 DIAGNOSIS — E1142 Type 2 diabetes mellitus with diabetic polyneuropathy: Secondary | ICD-10-CM | POA: Insufficient documentation

## 2016-01-29 DIAGNOSIS — I5022 Chronic systolic (congestive) heart failure: Secondary | ICD-10-CM | POA: Diagnosis not present

## 2016-01-29 DIAGNOSIS — Z7982 Long term (current) use of aspirin: Secondary | ICD-10-CM | POA: Diagnosis not present

## 2016-01-29 DIAGNOSIS — E875 Hyperkalemia: Secondary | ICD-10-CM | POA: Diagnosis not present

## 2016-01-29 DIAGNOSIS — N189 Chronic kidney disease, unspecified: Secondary | ICD-10-CM | POA: Diagnosis not present

## 2016-01-29 DIAGNOSIS — E1122 Type 2 diabetes mellitus with diabetic chronic kidney disease: Secondary | ICD-10-CM | POA: Diagnosis not present

## 2016-01-29 LAB — BASIC METABOLIC PANEL
ANION GAP: 5 (ref 5–15)
BUN: 35 mg/dL — ABNORMAL HIGH (ref 6–20)
CALCIUM: 8.3 mg/dL — AB (ref 8.9–10.3)
CO2: 27 mmol/L (ref 22–32)
Chloride: 106 mmol/L (ref 101–111)
Creatinine, Ser: 2.19 mg/dL — ABNORMAL HIGH (ref 0.61–1.24)
GFR, EST AFRICAN AMERICAN: 38 mL/min — AB (ref >60)
GFR, EST NON AFRICAN AMERICAN: 33 mL/min — AB (ref >60)
Glucose, Bld: 224 mg/dL — ABNORMAL HIGH (ref 65–99)
POTASSIUM: 5.9 mmol/L — AB (ref 3.5–5.1)
Sodium: 138 mmol/L (ref 135–145)

## 2016-01-29 LAB — CHOLESTEROL, TOTAL: CHOLESTEROL: 151 mg/dL (ref 0–200)

## 2016-01-29 NOTE — Progress Notes (Signed)
Patient ID: [name], male   DOB: 1961-07-18, 55 y.o.   MRN: [id number]    Advanced Heart Failure Clinic Note   PCP: Dr. [name] Cardiology: Dr. Shirlee Latch  55 y.o. with history of CAD s/p recent PCI, cirrhosis with thrombocytopenia, cardiac arrest, and ischemic cardiomyopathy.  Patient was admitted in 7/15-8/15 with gallstone pancreatitis.  It was decided not to do a cholecystectomy due to high surgical risk.  He was sent home and went back to work.  In 10/15, he had a cardiac arrest at work and was defibrillated by AED.  He was cooled and recovered well.  Platelets were initially very low but recovered to his baseline in the 60K range.  Cardiac cath was done showing severe 2 vessel disease.  Cardiac MRI was done, showing EF 40% with a mixed viability picture.  With recovery of platelets, he had Xience DES to the LAD and the distal RCA.  He had acute on chronic systolic CHF with volume overload and was diuresed and eventually discharge.  Unfortunately, he was sent home on daily metolazone and developed AKI.  Diuretics were held and this has gradually improved.  Spironolactone and lisinopril were stopped due to hyperkalemia.  However, he was able to restart lisinopril with simultaneous use of patiromer.  He is now off lisinopril again and is not sure why.  He is still taking patiromer.  He returns today for followup. Down 10 lbs from his last visit. Still having pain on his shoulder.  Energy level about the same.  Can go for about 20-30 minutes before needing to take a break.  Feels like he is more tired and SOB. Still feels "wobbly" after long periods on his feet. Has had some vision changes (blurred periphery with tunnel vision). Has seen opthamologist and told he was OK, has cataracts. Not having as much overt lightheadedness/dizziness.  Remains on light duty with shoulder injury.  Worried he won't be able to do 8 hours of full duty. No DOE on flat ground, just with stairs. Still having pedal edema. Had  sleep study, picking up BiPAP tomorrow.   Echo (1/17) showed EF up to 50-55%.  K was still high on last labs, so lisinopril and Veltassa stopped.   Labs (11/15): K 4.7 => 7.2, creatinine 1.37 => 2.1 => 2.25 with BUN 94, HCT 26.9, plts 67, P2Y12 241 Labs (11/20/14): K 6.2, creatinine 1.1, HCT 33.2, plts 45 Labs (12/18/14): K 6.1 creatinine 1.07 off spiro and lisinopril.  Labs (1/16): K 5.2, creatinine 1.12 Labs (2/16): LDL 59, HDL 41, HCT 32.4 Labs (3/16): K 5.4, creatinine 1.27 Labs (4/16): HCT 32.9, plts 47 Labs (5/16): K 5.2, creatinine 1.23, BNP 352 Labs (6/16): hgb 11.8, plts 41  Labs (7/16): K 4.7, creatinine 1.19 Labs (8/16): K 5, creatinine 1.28, hgb 11.8, plts 41 Labs (9/16): K 5, creatinine 1.39, BNP 321 Labs (1/17): K 5 => 5.4, creatinine 1.6 => 1.98, plts 99K, HCT 37.3  PMH: 1. 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  2. Thrombocytopenia: Chronic.  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   3. Gallstone pancreatitis: He did not have cholecystectomy as deemed too high a surgical risk.  4. CKD 5. 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 Bicuspid aortic valve: Mild to moderate AS with mean gradient 15 mmHg by echo in 1/17.  7. H/o perforated diverticulum.  8. CAD: Cardiac arrest 10/15.  LHC (10/15) with 80-90% proximal ramus stenosis, 90% mLAD, subtotaled distal LAD, 90%  D1, 90% dRCA/proximal PDA.  Patient had Xience DES to mLAD, dRCA.  9. Cardiac arrest: 10/15, defibrillated with AED and cooled.  10. Ischemic cardiomyopathy: Echo (8/15) with EF 35-40%, bicuspid aortic valve with mild AS/AI.  Cardiac MRI (10/15) with EF 40%, regional wall motion abnormalities, normal RV, delayed enhancement pattern suggesting mixed picture of viability. Echo (1/16) with EF 35-40%, hypokinesis of the mid to apical septum and the true apex, bicuspid aortic valve, mild AI.  Echo (4/16) with EF 40-45%, bicuspid aortic valve with moderate AS (mean  gradient 25 mmHg).  Echo (1/17) with EF 50-55%, severe LVH, mild to moderate AS mean gradient 15.   11. Carotid dopplers (4/16): Mild plaque 12. Lumbar spine degenerative disc disease.  13. Peripheral neuropathy 14. OSA  SH: Never smoked.  Prior ETOH abuse, now stopped.  Works at [employer] and [employer].  Lives with sister in [place].   FH: No premature CAD.   ROS: All systems reviewed and negative except as per HPI.   Current Outpatient Prescriptions  Medication Sig Dispense Refill  . aspirin 81 MG chewable tablet Chew 1 tablet (81 mg total) by mouth daily.    [name] atorvastatin (LIPITOR) 20 MG tablet Take 1 tablet (20 mg total) by mouth daily at 6 PM. 30 tablet 11  . carvedilol (COREG) 25 MG tablet Take 0.5 tablets (12.5 mg total) by mouth 2 (two) times daily. 90 tablet 3  . furosemide (LASIX) 40 MG tablet Take 1 tablet (40 mg total) by mouth daily. 30 tablet 3  . gabapentin (NEURONTIN) 300 MG capsule Take 300 mg by mouth at bedtime.  1  . insulin aspart (NOVOLOG) 100 UNIT/ML injection Inject 0-10 Units into the skin 3 (three) times daily before meals. Sliding scale 10 mL 6  . Insulin Degludec (TRESIBA FLEXTOUCH) 100 UNIT/ML SOPN Inject 18 Units into the skin at bedtime. 5 pen 2  . levofloxacin (LEVAQUIN) 250 MG tablet 500 mg day 1, then 250 mg for 6 days 8 tablet 0  . nitroGLYCERIN (NITROSTAT) 0.4 MG SL tablet Place 1 tablet (0.4 mg total) under the tongue every 5 (five) minutes as needed for chest pain. 90 tablet 3  . ondansetron (ZOFRAN) 4 MG tablet Take 1 tablet (4 mg total) by mouth every 8 (eight) hours as needed for nausea or vomiting. 20 tablet 0   No current facility-administered medications for this encounter.   BP 140/70 mmHg  Pulse 68  Resp 18  Wt 238 lb 4 oz (108.069 kg)  SpO2 95%   Wt Readings from Last 3 Encounters:  01/29/16 238 lb 4 oz (108.069 kg)  01/27/16 230 lb 12 oz (104.668 kg)  01/22/16 247 lb 8 oz (112.265 kg)     General: NAD Neck: Thick, JVP difficult,  no thyromegaly or thyroid nodule. Left carotid bruit.  Lungs: CTAB, normal effort CV: Nondisplaced PMI.  Heart regular S1/S2, no S3/S4, 2/6 early SEM RUSB with clear S2.   Abdomen: Soft, NT, ND, no HSM. No bruits or masses. +BS  Skin: Intact without lesions or rashes.  Neurologic: Alert and oriented x 3.  Psych: Normal affect. Extremities: No clubbing or cyanosis. 1+ edema 1/2 to knees bilaterally. Normal pedal pulses.  HEENT: Normal.   Assessment/Plan: 1. Chronic systolic CHF: Echo 01/18/16 50-55% improved from 03/2015 with EF 40-45%, ischemic cardiomyopathy.  NYHA class II symptoms. Volume status stable, does not appear volume overloaded.  - Continue Lasix 40 mg daily.  Check BMET today.  - No longer on Veltessa  or lisinopril with persistent hyperkalemia and EF improved.  - Continue Coreg 12.5 bid.  - Continue to wear graded compression stockings.  2. CAD: Status post Xience DES to LAD and RCA after cardiac arrest.  Now off Plavix. - Continue statin, check lipids today. - Continue ASA 81.  3. Cirrhosis: Likely related to ETOH, no longer drinking.  Has associated splenomegaly and chronic thrombocytopenia.  4. Thrombocytopenia: Chronic, likely due to splenomegaly. Will need to follow plts/hemoglobin over time closely.     5. DM: Per endocrinology.  6. OSA: To pick up Bipap this week. 7. Hyperkalemia: Check BMET today now that he is off lisinopril.  8. Carotid bruit: On left, carotid dopplers with mild plaque. Will need follow up in 4/18.  9. Imbalance: May be related to diabetic neuropathy, also has history of L-spine disease. Overall doing better but still with some unsteadiness.   Follow up 3 months. BMET and Lipids today.   Mariam Dollar [facility] 01/29/2016  Patient seen with PA, agree with the above note.  He continues to have difficulty working full time. He has ongoing shoulder pain as well as significant issues with balance.  Most recent echo showed improvement in EF to 50-55%.   Given ongoing hyperkalemia, I think that he can stop Veltassa and lisinopril today. BMET/lipids today.   Marca Ancona 01/30/2016

## 2016-01-29 NOTE — Patient Instructions (Signed)
Labs today.  Your physician recommends that you schedule a follow-up appointment in: 3 months  Do the following things EVERYDAY: 1) Weigh yourself in the morning before breakfast. Write it down and keep it in a log. 2) Take your medicines as prescribed 3) Eat low salt foods-Limit salt (sodium) to 2000 mg per day.  4) Stay as active as you can everyday 5) Limit all fluids for the day to less than 2 liters 6)   

## 2016-01-29 NOTE — Telephone Encounter (Signed)
Patient not on mychart.

## 2016-01-30 NOTE — Addendum Note (Signed)
Encounter addended by: Laurey Morale, MD on: 01/30/2016 11:47 PM<BR>     Documentation filed: Problem List

## 2016-02-01 ENCOUNTER — Encounter: Payer: Self-pay | Admitting: *Deleted

## 2016-02-01 ENCOUNTER — Telehealth: Payer: Self-pay | Admitting: *Deleted

## 2016-02-01 ENCOUNTER — Other Ambulatory Visit: Payer: Self-pay | Admitting: Neurology

## 2016-02-01 DIAGNOSIS — E1142 Type 2 diabetes mellitus with diabetic polyneuropathy: Secondary | ICD-10-CM

## 2016-02-01 DIAGNOSIS — I739 Peripheral vascular disease, unspecified: Secondary | ICD-10-CM

## 2016-02-01 DIAGNOSIS — R2681 Unsteadiness on feet: Secondary | ICD-10-CM

## 2016-02-01 NOTE — Telephone Encounter (Signed)
Note printed and signed by Dr. Claiborne Billings. Left message advising note is ready for p/u.

## 2016-02-01 NOTE — Telephone Encounter (Signed)
That is fine to be out. Please print excuse for him.

## 2016-02-01 NOTE — Telephone Encounter (Signed)
Pt LMOM on 02/01/16 at 8:23am stating that he was seen by Dr. Claiborne Billings and was given a work note to be out til 02/01/16 but he woke up this morning vomiting. He is requesting a new work to be out til 02/03/15 when he comes in for f/u with Dr. [name]. Please advise. Thanks.

## 2016-02-03 ENCOUNTER — Ambulatory Visit (HOSPITAL_COMMUNITY)
Admission: RE | Admit: 2016-02-03 | Discharge: 2016-02-03 | Disposition: A | Discharge status: Home / Self Care | Payer: 59 | Source: Ambulatory Visit | Attending: Cardiovascular Disease | Admitting: Cardiovascular Disease

## 2016-02-03 DIAGNOSIS — L97509 Non-pressure chronic ulcer of other part of unspecified foot with unspecified severity: Secondary | ICD-10-CM | POA: Insufficient documentation

## 2016-02-03 DIAGNOSIS — E113419 Type 2 diabetes mellitus with severe nonproliferative diabetic retinopathy with macular edema, unspecified eye: Secondary | ICD-10-CM | POA: Insufficient documentation

## 2016-02-03 DIAGNOSIS — E1121 Type 2 diabetes mellitus with diabetic nephropathy: Secondary | ICD-10-CM | POA: Insufficient documentation

## 2016-02-03 DIAGNOSIS — R2681 Unsteadiness on feet: Secondary | ICD-10-CM | POA: Diagnosis not present

## 2016-02-03 DIAGNOSIS — E11621 Type 2 diabetes mellitus with foot ulcer: Secondary | ICD-10-CM | POA: Diagnosis not present

## 2016-02-03 DIAGNOSIS — E1149 Type 2 diabetes mellitus with other diabetic neurological complication: Secondary | ICD-10-CM | POA: Insufficient documentation

## 2016-02-03 DIAGNOSIS — I739 Peripheral vascular disease, unspecified: Secondary | ICD-10-CM | POA: Insufficient documentation

## 2016-02-03 DIAGNOSIS — R42 Dizziness and giddiness: Secondary | ICD-10-CM | POA: Insufficient documentation

## 2016-02-03 DIAGNOSIS — E1142 Type 2 diabetes mellitus with diabetic polyneuropathy: Secondary | ICD-10-CM | POA: Insufficient documentation

## 2016-02-03 DIAGNOSIS — E1122 Type 2 diabetes mellitus with diabetic chronic kidney disease: Secondary | ICD-10-CM | POA: Diagnosis not present

## 2016-02-03 DIAGNOSIS — N183 Chronic kidney disease, stage 3 (moderate): Secondary | ICD-10-CM | POA: Insufficient documentation

## 2016-02-03 DIAGNOSIS — I1310 Hypertensive heart and chronic kidney disease without heart failure, with stage 1 through stage 4 chronic kidney disease, or unspecified chronic kidney disease: Secondary | ICD-10-CM | POA: Insufficient documentation

## 2016-02-03 DIAGNOSIS — I10 Essential (primary) hypertension: Secondary | ICD-10-CM | POA: Diagnosis not present

## 2016-02-04 ENCOUNTER — Encounter: Payer: Self-pay | Admitting: Family Medicine

## 2016-02-04 ENCOUNTER — Ambulatory Visit (INDEPENDENT_AMBULATORY_CARE_PROVIDER_SITE_OTHER): Payer: 59 | Admitting: Family Medicine

## 2016-02-04 VITALS — BP 132/85 | HR 67 | Temp 98.2°F | Resp 16 | Ht 70.0 in | Wt 243.0 lb

## 2016-02-04 DIAGNOSIS — K7460 Unspecified cirrhosis of liver: Secondary | ICD-10-CM

## 2016-02-04 DIAGNOSIS — A09 Infectious gastroenteritis and colitis, unspecified: Secondary | ICD-10-CM | POA: Diagnosis not present

## 2016-02-04 DIAGNOSIS — R197 Diarrhea, unspecified: Secondary | ICD-10-CM

## 2016-02-04 DIAGNOSIS — N183 Chronic kidney disease, stage 3 unspecified: Secondary | ICD-10-CM

## 2016-02-04 DIAGNOSIS — J9801 Acute bronchospasm: Secondary | ICD-10-CM

## 2016-02-04 DIAGNOSIS — B349 Viral infection, unspecified: Secondary | ICD-10-CM | POA: Diagnosis not present

## 2016-02-04 DIAGNOSIS — I255 Ischemic cardiomyopathy: Secondary | ICD-10-CM

## 2016-02-04 MED ORDER — ALBUTEROL SULFATE HFA 108 (90 BASE) MCG/ACT IN AERS: 1.0000 | INHALATION_SPRAY | RESPIRATORY_TRACT | Status: AC | PRN

## 2016-02-04 NOTE — Progress Notes (Signed)
Pre visit review using our clinic review tool, if applicable. No additional management support is needed unless otherwise documented below in the visit note. 

## 2016-02-04 NOTE — Patient Instructions (Signed)
Buy otc generic allegra 180mg  tabs and take 1 tab once a day until your runny nose is much improved. Buy otc generic saline nasal spray and spray 2-3 sprays in each nostril and blow nose.  Do this several times per day.

## 2016-02-04 NOTE — Progress Notes (Signed)
OFFICE VISIT  02/04/2016   CC:  Chief Complaint  Patient presents with  . Follow-up    Illness   HPI:    Patient is a 55 y.o. Caucasian male who presents for 8 day f/u for recent illness: dx'd with sinusitis and treated with levaquin x 8d.  Zofran rx'd for nausea associated with this illness. Still with nasal mucous, PND, leads to nausea and sometimes he vomits.  Still with intermittent loose BMs, but then he goes on to say he has had no watery BM in last 3 days.  Says he feels like his legs have less swelling. Denies malaise, abd pain, or fevers. Has cough and gets winded easily when he starts coughing a lot or talking fast.  Sx's worse late at night and in early morning.    Illness present almost 2 full weeks now.  His description of his illness course sounds to me like a viral syndrome causing a mix of GI, URI, and bronchitic sx's.     Past Medical History  Diagnosis Date  . Heart murmur     ECHO 07/2011 showed mild AS and LVH.  TEE 09/2011 showed small PFO, bicuspid aortic valve but no stenosis or regurg.  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  Gen surg and GI recommended flagyl and cipro x 2 wks..  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  . Cholelithiasis     u/s--no cholecystitis.  06/2014 u/s showed no stones, only GB sludge  . Peripheral neuropathy (HCC)     Diabetic:  Autonomic (pelvic) and LE polyneuropathy--[facility] testing showed that it is likely from DM.  Followed by Dr. Posey Pronto with [place] neuro as of 09/2015  . Diabetic foot ulcers (HCC)     Poor healing; normal ABIs and TBIs  . History of pyelonephritis   . Type II or unspecified type diabetes mellitus with neurological manifestations, not stated as uncontrolled     IDDM: neuro, renal, and ophth complications  . Wears dentures     upper  .  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 Diabetic retinopathy associated with type 2 diabetes mellitus ([place])     Laser tx in both eyes (Dr. Baird Cancer): severe nonprolif diab retpthy, diabetic macular edema (05/2015)  . Diabetic nephropathy (HCC)     Proteinuria and CrCl 55-65  . Chronic renal insufficiency, stage III (moderate)     CrCl 50s-80s 2014 to pre-VFIB arrest 10/02/14, at which time he sustained AKI.  . Lumbar spondylosis     MRI 08/25/11 with multilevel facet dz, small disc protrusion at L5-S1 to the right without impingement; repeat L spine MRI 09/2015 same except L5-S1 disc bulge causing some mass effect on R intraspinal S1 nerve root.  No canal stenosis.  . Ischemic cardiomyopathy 07/22/14    Myoview w/out ischemia but large scar (inferior/apex) w/ global hypokinesis--medical mgmt  . History of pancreatitis 2014 and 2015    Suspected biliary: passing small gallstones: surgery declined to remove GB 07/2014.  . Aspiration pneumonia ([place])   . Thrombocytopenia ([place])   . Portal hypertension ([place])   . Liver cirrhosis secondary to NASH     ?+ alcohol?.  With hx of splenomegaly and ascities (dx approx 2012)  . History of gram  negative sepsis 2015    e coli  . Ventricular fibrillation ([place]) 10/02/14    Resuscitated, hospitalized, 3 V CAD detected, DES stent to mid LAD and to RCA were placed.  . Peripheral edema     chronic asymmetric LE edema (L>>R)  . OSA (obstructive sleep apnea) 11/15/2015    Moderate with AHI 16/hr  . Hyperkalemia     cannot use ACE-I/ARB's/aldactone due to this.  Pt on patiromer Daryll Drown) --to help him excrete more potassium in feces  . Diabetic neuropathy with neurologic complication (HCC)     Large fiber, L>R; affecting sensation and proprioception    Past Surgical History  Procedure Laterality Date  . Knee arthroscopy  2001    Bilateral  . Amputation  10/24/2012    Procedure: AMPUTATION RAY;   Surgeon: Colin Rhein, MD;  Location: [facility];  Service: Orthopedics;  Laterality: Left;  left 4th toe amputation through MTP joint, 5th Ray amputation   . Knee surgery  2001  . [place] scan  06/2014    Normal  . Transthoracic echocardiogram  07/19/14;12/2014;03/2015    Mild LV dilation and hypertrophy, EF 35-40%, no wall motion abnormalities, grade II diast dysfxn, bicuspid aortic valve with mild AS and mild AI.  No change on 12/2014 echo.  03/2015 EF improved to 40-45%, moderate AS/bicuspid aortic valve  . Cardiovascular stress test  07/2014    Myoview w/out ischemia but large scar (inferior/apex) w/ global hypokinesis--medical mgmt  . Eye surgery  08/2014    Retinal hemorrhage evacuation  . Coronary angioplasty with stent placement  09/2014    DES to mid LAD and RCA; EF 40% by cardiac MRI  . Cardiac mri  09/2014    Moderately dilated LV.  EF 40%, multiple wall motion abnormalities, normal RV fxn.  Some areas of scarring.  . Left heart catheterization with coronary angiogram N/A 10/10/2014    Procedure: LEFT HEART CATHETERIZATION WITH CORONARY ANGIOGRAM;  Surgeon: Peter M Martinique, MD;  Location: [facility] CATH [facility];  Service: Cardiovascular;  Laterality: N/A;  . Left heart catheterization with coronary angiogram N/A 10/15/2014    Procedure: LEFT HEART CATHETERIZATION WITH CORONARY ANGIOGRAM;  Surgeon: Wellington Hampshire, MD;  Location: [facility] [facility] CATH LAB;  Service: Cardiovascular;  Laterality: N/A;  . Carotid dopplers  04/10/15    1-35% bilat; normal subclavians  . Venous doppler ultrasound Left 01/06/16    NO DVT     Outpatient Prescriptions Prior to Visit  Medication Sig Dispense Refill  . aspirin 81 MG chewable tablet Chew 1 tablet (81 mg total) by mouth daily.    [name] atorvastatin (LIPITOR) 20 MG tablet Take 1 tablet (20 mg total) by mouth daily at 6 PM. 30 tablet 11  . carvedilol (COREG) 25 MG tablet Take 0.5 tablets (12.5 mg total) by mouth 2 (two) times daily. 90 tablet 3  . furosemide (LASIX)  40 MG tablet Take 1 tablet (40 mg total) by mouth daily. 30 tablet 3  . gabapentin (NEURONTIN) 300 MG capsule Take 300 mg by mouth at bedtime.  1  . insulin aspart (NOVOLOG) 100 UNIT/ML injection Inject 0-10 Units into the skin 3 (three) times daily before meals. Sliding scale 10 mL 6  . Insulin Degludec (TRESIBA FLEXTOUCH) 100 UNIT/ML SOPN Inject 18 Units into the skin at bedtime. 5 pen 2  . nitroGLYCERIN (NITROSTAT) 0.4 MG SL tablet Place 1 tablet (0.4 mg total) under the tongue every 5 (five) minutes as needed for chest pain. 90 tablet 3  .  ondansetron (ZOFRAN) 4 MG tablet Take 1 tablet (4 mg total) by mouth every 8 (eight) hours as needed for nausea or vomiting. 20 tablet 0  . levofloxacin (LEVAQUIN) 250 MG tablet 500 mg day 1, then 250 mg for 6 days (Patient not taking: Reported on 02/04/2016) 8 tablet 0   No facility-administered medications prior to visit.    Allergies  Allergen Reactions  . Iodine Other (See Comments)    Family history of anaphylaxis, unsure if patient has reaction  . Other Shortness Of Breath, Itching and Other (See Comments)    PERFUMES - SNEEZING  . Hydralazine Hcl Hives  . Naproxen Nausea And Vomiting    ROS As per HPI  PE: Blood pressure 132/85, pulse 67, temperature 98.2 F (36.8 C), temperature source Oral, resp. rate 16, height 5' 10"  (1.778 m), weight 243 lb (110.224 kg), SpO2 98 %. Gen: Alert, well appearing.  Patient is oriented to person, place, time, and situation. KJZ:PHXT: no injection, icteris, swelling, or exudate.  EOMI, PERRLA. Mouth: lips without lesion/swelling.  Oral mucosa pink and moist. Oropharynx without erythema, exudate, or swelling.  CV: RRR, no m/r/g.   LUNGS: CTA bilat, nonlabored resps, good aeration in all lung fields.  Some post exhalation coughing intermittently. ABD: soft, NT/ND EXT: 2+ pitting on R LE and 3+ pitting on L LE.  LABS:    Chemistry      Component Value Date/Time   NA 138 01/29/2016 1107   K 5.9*  01/29/2016 1107   CL 106 01/29/2016 1107   CO2 27 01/29/2016 1107   BUN 35* 01/29/2016 1107   CREATININE 2.19* 01/29/2016 1107   CREATININE 1.61* 01/06/2016 1526      Component Value Date/Time   CALCIUM 8.3* 01/29/2016 1107   ALKPHOS 129* 12/30/2015 0930   AST 33 12/30/2015 0930   ALT 22 12/30/2015 0930   BILITOT 0.5 12/30/2015 0930      IMPRESSION AND PLAN:  1) Viral syndrome; slowly resolving. If any further watery BM's then I want him to collect a stool sample: collection kit given, orders for testing entered. No antidiarrheals recommended. He'll continue zofran prn.   I rx'd albuterol 2 p q4h prn to try b/c I think he is having some bronchospasm.  Nurse did inhaler education with pt today. Additional symptom mgmt: Buy otc generic allegra 188m tabs and take 1 tab once a day until your runny nose is much improved.  Buy otc generic saline nasal spray and spray 2-3 sprays in each nostril and blow nose.  Do this several times per day.  Since he is improving and his watery diarrhea resolved 3 d/a, I wrote a letter stating that he may return to work tomorrow, 02/05/16.  2) Ischemic CM + hepatic cirrhosis with portal HTN---this has resulted in volume control issues, the treatment of which has been complicated by CRI stage III and hyperkalemia.  He is set up for repeat of BMET via Dr. MAundra Dubintomorrow. Although his wt is up 5 lbs over the last 6d, he seems to only be accumulating some fluid in LL's and he states that this feels BETTER than in the recent past.  No change in diuretic regimen at this time.  Spent 30 min with pt today, with >50% of this time spent in counseling and care coordination regarding the above problems.  An After Visit Summary was printed and given to the patient.  FOLLOW UP: Return in about 4 months (around 06/03/2016) for routine chronic illness f/u.

## 2016-02-05 ENCOUNTER — Telehealth: Payer: Self-pay | Admitting: Internal Medicine

## 2016-02-05 ENCOUNTER — Ambulatory Visit (HOSPITAL_COMMUNITY)
Admission: RE | Admit: 2016-02-05 | Discharge: 2016-02-05 | Disposition: A | Discharge status: Home / Self Care | Payer: 59 | Source: Ambulatory Visit | Attending: Cardiology | Admitting: Cardiology

## 2016-02-05 DIAGNOSIS — I5022 Chronic systolic (congestive) heart failure: Secondary | ICD-10-CM | POA: Diagnosis not present

## 2016-02-05 LAB — BASIC METABOLIC PANEL
ANION GAP: 6 (ref 5–15)
BUN: 40 mg/dL — ABNORMAL HIGH (ref 6–20)
CO2: 25 mmol/L (ref 22–32)
Calcium: 8.7 mg/dL — ABNORMAL LOW (ref 8.9–10.3)
Chloride: 105 mmol/L (ref 101–111)
Creatinine, Ser: 1.89 mg/dL — ABNORMAL HIGH (ref 0.61–1.24)
GFR calc Af Amer: 45 mL/min — ABNORMAL LOW (ref >60)
GFR, EST NON AFRICAN AMERICAN: 39 mL/min — AB (ref >60)
GLUCOSE: 261 mg/dL — AB (ref 65–99)
POTASSIUM: 5.5 mmol/L — AB (ref 3.5–5.1)
SODIUM: 136 mmol/L (ref 135–145)

## 2016-02-05 MED ORDER — INSULIN ASPART 100 UNIT/ML ~~LOC~~ SOLN: 0.0000 [IU] | Freq: Three times a day (TID) | SUBCUTANEOUS | Status: DC

## 2016-02-05 MED ORDER — INSULIN DEGLUDEC 100 UNIT/ML ~~LOC~~ SOPN: 18.0000 [IU] | PEN_INJECTOR | Freq: Every day | SUBCUTANEOUS | Status: DC

## 2016-02-05 MED ORDER — GLUCOSE BLOOD VI STRP: ORAL_STRIP | Status: AC

## 2016-02-05 NOTE — Telephone Encounter (Signed)
Patient need a refill of Novalog, Test Strips, Tresiba Flex touch send to [facility].

## 2016-02-09 ENCOUNTER — Ambulatory Visit (INDEPENDENT_AMBULATORY_CARE_PROVIDER_SITE_OTHER): Payer: 59 | Admitting: Neurology

## 2016-02-09 DIAGNOSIS — I739 Peripheral vascular disease, unspecified: Secondary | ICD-10-CM

## 2016-02-09 DIAGNOSIS — R2681 Unsteadiness on feet: Secondary | ICD-10-CM | POA: Diagnosis not present

## 2016-02-09 DIAGNOSIS — E1142 Type 2 diabetes mellitus with diabetic polyneuropathy: Secondary | ICD-10-CM

## 2016-02-09 NOTE — Procedures (Signed)
[facility]  [address] [place], [address]  [place], [place] [address] Tel: [phone] Fax:  [phone] Test Date:  02/09/2016  Patient: [name] DOB: [birth year] Physician: Nita Sickle, DO  Sex: Male Height:  Ref Phys: Nita Sickle, DO  ID#: [id number] Temp: 32.5C Technician: Judie Petit. Dean   Patient Complaints: This is a 55 year old gentleman with known diabetic neuropathy presenting for evaluation of new gait difficulty and left arm paresthesias, worse over the 4th and 5th digits.  NCV & EMG Findings: Extensive electrodiagnostic testing of the left upper and lower extremity shows: 1. Left median and ulnar sensory responses are absent. Left radial sensory response shows prolonged latency (2.9 ms) and reduced amplitude (8.1 V).   2. Left median motor nerve shows prolonged distal onset latency (5.9 ms), reduced amplitude (4.5 mV), and decreased conduction velocity (Elbow-Wrist, 46 m/s).  Left ulnar motor responses recording at the abductor digiti minimi shows prolonged latency (3.4 ms), reduced amplitude (4.7 mV), reduced conduction velocity across the forearm with conduction block (B Elbow-Wrist, L39 m/s), and reduced conduction velocity across the elbow (A Elbow-B Elbow, L48 m/s).  Similar findings are also seen when stimulating the ulnar nerve and recording at the first dorsal interosseous muscle.  Patient's electrodiagnostic testing from November 2012 performed at [facility] showed similar drop in ulnar motor amplitude in the forearm; however today's study shows an interval worsening neuropathy. 3. Left sural and superficial peroneal sensory responses are absent. 4. Left peroneal (EDB) and tibial (AH) motor responses are absent. Left peroneal motor response recording at the tibialis anterior shows markedly reduced amplitude (1.1 mV) and decreased conduction velocity (Poplit-Fib Head, 32 m/s).   5. Bilateral tibial H reflex studies are absent. 6. In the left upper  extremity, chronic motor axon loss changes are seen affecting the distal hand muscles. There is no evidence of active ongoing denervation. 7. In the left lower extremity, moderate chronic motor axon loss changes are seen affecting all the muscles below the knee and spares proximal muscle groups.  There is no accompanied active denervation.  Impression: The electrophysiologic findings are most consistent with a severe, chronic and generalized sensorimotor polyneuropathy, demyelinating and axon loss in type, affecting the left upper and lower extremities. The presence of conduction block along the ulnar nerve in the forearm suggests an acquired process, clinical correlation recommended. When compared to patient's prior electrodiagnostic testing from November 30th 2012 (performed at [facility]), there has been an interval progression of neuropathy.   ___________________________ Nita Sickle, DO    Nerve Conduction Studies Anti Sensory Summary Table   Site NR Peak (ms) Norm Peak (ms) P-T Amp (V) Norm P-T Amp  Left Median Anti Sensory (2nd Digit)  32.5C  Wrist NR  <3.6  >15  Left Radial Anti Sensory (Base 1st Digit)  32.5C  Wrist    2.9 <2.7 8.1 >14  Left Sup Peroneal Anti Sensory (Ant Lat Mall)  32.5C  12 cm NR  <4.6  >4  Left Sural Anti Sensory (Lat Mall)  32.5C  Calf NR  <4.6  >4  Left Ulnar Anti Sensory (5th Digit)  32.5C  Wrist NR  <3.1  >10   Motor Summary Table   Site NR Onset (ms) Norm Onset (ms) O-P Amp (mV) Norm O-P Amp Site1 Site2 Delta-0 (ms) Dist (cm) Vel (m/s) Norm Vel (m/s)  Left Median Motor (Abd Poll Brev)  32.5C  Wrist    5.9 <4.0 4.5 >6 Elbow Wrist 5.7 26.0 46 >50  Elbow    11.6  3.9         Left Peroneal Motor (Ext Dig Brev)  32.5C  Ankle NR  <6.0  >2.5 B Fib Ankle  0.0  >40  B Fib NR     Poplt B Fib  0.0  >40  Poplt NR            Left Peroneal TA Motor (Tib Ant)  32.5C  Fib Head    3.9 <4.5 1.1 >3 Poplit Fib Head 2.5 8.0 32 >40  Poplit     6.4  0.9         Left Tibial Motor (Abd Hall Brev)  32.5C  Ankle NR  <6.0  >4 Knee Ankle  0.0  >40  Knee NR            Left Ulnar Motor (Abd Dig Minimi)  32.5C  Wrist    3.4 <3.1 4.7 >7 B Elbow Wrist 5.4 21.0 39 >50  B Elbow    8.8  1.8  A Elbow B Elbow 2.1 10.0 48 >50  A Elbow    10.9  1.7         Left Ulnar (FDI) Motor (1st DI)  32.5C  Wrist    5.0 <4.5 3.0 >7 B Elbow Wrist 4.8 22.0 46 >50  B Elbow    9.8  0.5  A Elbow B Elbow 2.2 10.0 45 >50  A Elbow    12.0  0.4          H Reflex Studies   NR H-Lat (ms) Lat Norm (ms) L-R H-Lat (ms)  Left Tibial (Gastroc)  32.5C  NR  <35   Right Tibial (Gastroc)  32.5C  NR  <35    EMG   Side Muscle Ins Act Fibs Psw Fasc Number Recrt Dur Dur. Amp Amp. Poly Poly. Comment  Left AntTibialis Nml Nml Nml Nml 3- Rapid Most 1+ Most 1+ Many 1+ ATR  Left Gastroc Nml Nml Nml Nml 2- Mod-R Some 1+ Some 1+ Nml Nml N/A  Left 1stDorInt Nml Nml Nml Nml 2- Rapid Some 1+ Some 1+ Some 1+ N/A  Left RectFemoris Nml Nml Nml Nml Nml Nml Nml Nml Nml Nml Nml Nml N/A  Left GluteusMed Nml Nml Nml Nml Nml Nml Nml Nml Nml Nml Nml Nml N/A  Left BicepsFemS Nml Nml Nml Nml Nml Nml Nml Nml Nml Nml Nml Nml N/A  Left Abd Poll Brev Nml Nml Nml Nml 2- Rapid Some 1+ Some 1+ Some 1+ N/A  Left ABD Dig Min Nml Nml Nml Nml 2- Rapid Some 1+ Some 1+ Nml Nml N/A  Left Ext Indicis Nml Nml Nml Nml 1- Rapid Few 1+ Few 1+ Nml Nml N/A  Left PronatorTeres Nml Nml Nml Nml Nml Nml Nml Nml Nml Nml Nml Nml N/A  Left Biceps Nml Nml Nml Nml Nml Nml Nml Nml Nml Nml Nml Nml N/A  Left Triceps Nml Nml Nml Nml Nml Nml Nml Nml Nml Nml Nml Nml N/A  Left Deltoid Nml Nml Nml Nml 1- Rapid Few 1+ Few 1+ Nml Nml N/A  Left Cervical Parasp Low Nml Nml Nml Nml Nml Nml Nml Nml Nml Nml Nml Nml N/A  Left Flexor Dig Ulnaris Nml Nml Nml Nml Nml Nml Nml Nml Nml Nml Nml Nml N/A      Waveforms:

## 2016-02-10 ENCOUNTER — Telehealth: Payer: Self-pay | Admitting: Neurology

## 2016-02-10 NOTE — Telephone Encounter (Signed)
I attempted to contact patient via phone today regarding the results of EMG, however there was no answer so a message was left for the patient to return my call.   Tiawanna Luchsinger K. James Senn, DO   

## 2016-02-11 ENCOUNTER — Telehealth (HOSPITAL_COMMUNITY): Payer: Self-pay | Admitting: *Deleted

## 2016-02-11 MED ORDER — PATIROMER SORBITEX CALCIUM 8.4 G PO PACK: 8.4000 g | PACK | Freq: Every day | ORAL | Status: DC

## 2016-02-11 NOTE — Telephone Encounter (Signed)
Notes Recorded by Noralee Space, RN on 02/11/2016 at 12:39 PM Pt aware and agreeable, he states he still has Veltassa at home and will start it, rx sent to pharmacy, he is going out of town on Sat and states he can not have labs until her returns on 3/13

## 2016-02-11 NOTE — Telephone Encounter (Signed)
-----   Message from Laurey Morale, MD sent at 02/05/2016  2:49 PM EST ----- K is still high.  Make sure he is off the lisinopril.  Would have him restart Veltassa at the prior dose and will need BMET on Monday or Tuesday.

## 2016-02-29 ENCOUNTER — Ambulatory Visit (HOSPITAL_COMMUNITY)
Admission: RE | Admit: 2016-02-29 | Discharge: 2016-02-29 | Disposition: A | Discharge status: Home / Self Care | Payer: 59 | Source: Ambulatory Visit | Attending: Cardiology | Admitting: Cardiology

## 2016-02-29 DIAGNOSIS — I5022 Chronic systolic (congestive) heart failure: Secondary | ICD-10-CM | POA: Insufficient documentation

## 2016-02-29 LAB — BASIC METABOLIC PANEL
Anion gap: 7 (ref 5–15)
BUN: 23 mg/dL — AB (ref 6–20)
CO2: 25 mmol/L (ref 22–32)
CREATININE: 1.62 mg/dL — AB (ref 0.61–1.24)
Calcium: 8.4 mg/dL — ABNORMAL LOW (ref 8.9–10.3)
Chloride: 106 mmol/L (ref 101–111)
GFR calc Af Amer: 54 mL/min — ABNORMAL LOW (ref >60)
GFR, EST NON AFRICAN AMERICAN: 47 mL/min — AB (ref >60)
Glucose, Bld: 164 mg/dL — ABNORMAL HIGH (ref 65–99)
Potassium: 4.9 mmol/L (ref 3.5–5.1)
SODIUM: 138 mmol/L (ref 135–145)

## 2016-03-07 ENCOUNTER — Encounter: Payer: Self-pay | Admitting: Cardiology

## 2016-03-08 ENCOUNTER — Encounter: Payer: Self-pay | Admitting: Cardiology

## 2016-03-11 ENCOUNTER — Encounter: Payer: Self-pay | Admitting: Internal Medicine

## 2016-03-11 ENCOUNTER — Ambulatory Visit (INDEPENDENT_AMBULATORY_CARE_PROVIDER_SITE_OTHER): Payer: 59 | Admitting: Internal Medicine

## 2016-03-11 ENCOUNTER — Other Ambulatory Visit (INDEPENDENT_AMBULATORY_CARE_PROVIDER_SITE_OTHER): Payer: 59 | Admitting: *Deleted

## 2016-03-11 VITALS — BP 132/74 | HR 60 | Temp 97.5°F | Resp 12 | Wt 255.0 lb

## 2016-03-11 DIAGNOSIS — E1159 Type 2 diabetes mellitus with other circulatory complications: Secondary | ICD-10-CM | POA: Diagnosis not present

## 2016-03-11 DIAGNOSIS — E1165 Type 2 diabetes mellitus with hyperglycemia: Secondary | ICD-10-CM

## 2016-03-11 LAB — POCT GLYCOSYLATED HEMOGLOBIN (HGB A1C): HEMOGLOBIN A1C: 8.2

## 2016-03-11 MED ORDER — INSULIN DEGLUDEC 100 UNIT/ML ~~LOC~~ SOPN: 22.0000 [IU] | PEN_INJECTOR | Freq: Every day | SUBCUTANEOUS | Status: DC

## 2016-03-11 NOTE — Progress Notes (Signed)
Patient ID: [name], male   DOB: Mar 11, 1961, 55 y.o.   MRN: [id number]  HPI: [name] is a 55 y.o.-year-old male, returning for f/u for DM2, dx 2012, insulin-dependent since dx, uncontrolled, with complications (CRI stage 2, CAD, ischemic cardiomyopathy, PN, DR, L 4 and 5th toes amputated 2/2 osteomyelitis - 09/2012). Last visit 9 mo ago!  He fell and hurt his shoulder and knee at work 09/2015.  He has a CPAP now.  He also had a sinus infection.  Last hemoglobin A1c was: Lab Results  Component Value Date   HGBA1C 6.4 05/22/2015   HGBA1C 6.3 02/19/2015   HGBA1C 5.8* 10/25/2014   Pt is on a regimen of: - Tresiba 18 units at bedtime. - NovoLog at mealtime: - 6 units before breakfast - 8-10 units before lunch - 8-10 units before dinner Do not take insulin with breakfast if sugars before breakfast are <100.Previously on Victoza.  He works days now.  Pt checks his sugars 3x a day: - am: 86-126 >> 90-100 >> 90-124 >> 79-127 >> 77, 130-140, 157 - 2h after b'fast: n/c >> 154-164, 185, 253x1 >> n/c - before lunch: 68 (when works nights)-136 >> 120s >> 128 >> 68, 109-157 >> 150-160s, 209 - 2h after lunch: n/c >> 132 >> n/c - before dinner: 126-137 >> 120s >> 147-157, 202 >> 140-160s - 2h after dinner: n/c - bedtime: 120-130s >> 100-110 >> 134 >> 147-160 >> n/c - nighttime: n/c Lowest sugar was 77x1; he has hypoglycemia awareness at 80.   Meter: Freestyle Lite.  Pt's meals are: - Breakfast: yoghurt + fruit; PB + apple; eggs + toast - Lunch: leftovers or sandwich - Dinner: meat + starch (sometimes) + vegetables - Snacks: PB crackers, no soft drinks  - Has CKD, last BUN/creatinine:  Lab Results  Component Value Date   BUN 23* 02/29/2016   CREATININE 1.62* 02/29/2016  On Lisinopril - last set of lipids: Lab Results  Component Value Date   CHOL 151 01/29/2016   HDL 41 02/09/2015   LDLCALC 59 02/09/2015   TRIG 160* 02/09/2015   CHOLHDL 3.2 02/09/2015   - last  eye exam was in 12/2015. + DR. He had surgery in 08/2014. Had laser sx.. Has cataracts. Dr Allyne Gee. - + numbness and tingling in his feet. Has severe PN.  He has a h/o cardio-respiratory arrest 10/02/2014 (hypotension, hyperkalemia) >> resuscitated.  He had stents placed for CAD.   He sees neurology for neuropathy ([place]).  Pt has a h/o pancreatitis (gall bladder stones), cirrhosis (NASH).  ROS: Constitutional: + weight gain, no fatigue, no subjective hypo-/hyperthermia Eyes:+ blurry vision, no xerophthalmia ENT: no sore throat, no nodules palpated in throat, no dysphagia/odynophagia, no hoarseness Cardiovascular: no CP/+ SOB/no palpitations/+ leg swelling Respiratory:+ cough/+ SOB Gastrointestinal: no N/+ V/no D/C Musculoskeletal: + muscle/+ joint aches Skin: no rashes Neurological: no tremors/+ numbness (legs)/tingling/dizziness  I reviewed pt's medications, allergies, PMH, social hx, family hx, and changes were documented in the history of present illness. Otherwise, unchanged from my initial visit note. Changed the dose of Carvedilol and decreased Lasix. Past Medical History  Diagnosis Date  . Heart murmur     ECHO 07/2011 showed mild AS and LVH.  TEE 09/2011 showed small PFO, bicuspid aortic valve but no stenosis or regurg.  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  Gen surg and GI recommended flagyl and cipro x  2 wks..  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  . Cholelithiasis     u/s--no cholecystitis.  06/2014 u/s showed no stones, only GB sludge  . Peripheral neuropathy (HCC)     Diabetic:  Autonomic (pelvic) and LE polyneuropathy--WFUB testing showed that it is likely from DM.  Followed by Dr. Allena Katz with [facility] as of 09/2015  . Diabetic foot ulcers (HCC)     Poor healing; normal ABIs and TBIs  .  History of pyelonephritis   . Type II or unspecified type diabetes mellitus with neurological manifestations, not stated as uncontrolled     IDDM: neuro, renal, and ophth complications  . Wears dentures     upper  . Osteomyelitis of toe of left foot (HCC)     left 4th and 5th toes--now s/p amputation of these areas  . Diabetic retinopathy associated with type 2 diabetes mellitus (HCC)     Laser tx in both eyes (Dr. Allyne Gee): severe nonprolif diab retpthy, diabetic macular edema (05/2015)  . Diabetic nephropathy (HCC)     Proteinuria and CrCl 55-65  . Chronic renal insufficiency, stage III (moderate)     CrCl 50s-80s 2014 to pre-VFIB arrest 10/02/14, at which time he sustained AKI.  . Lumbar spondylosis     MRI 08/25/11 with multilevel facet dz, small disc protrusion at L5-S1 to the right without impingement; repeat L spine MRI 09/2015 same except L5-S1 disc bulge causing some mass effect on R intraspinal S1 nerve root.  No canal stenosis.  . Ischemic cardiomyopathy 07/22/14    Myoview w/out ischemia but large scar (inferior/apex) w/ global hypokinesis--medical mgmt  . History of pancreatitis 2014 and 2015    Suspected biliary: passing small gallstones: surgery declined to remove GB 07/2014.  . Aspiration pneumonia (HCC)   . Thrombocytopenia (HCC)   . Portal hypertension (HCC)   . Liver cirrhosis secondary to NASH     ?+ alcohol?.  With hx of splenomegaly and ascities (dx approx 2012)  . History of gram negative sepsis 2015    e coli  . Ventricular fibrillation (HCC) 10/02/14    Resuscitated, hospitalized, 3 V CAD detected, DES stent to mid LAD and to RCA were placed.  . Peripheral edema     chronic asymmetric LE edema (L>>R)  . OSA (obstructive sleep apnea) 11/15/2015    Moderate with AHI 16/hr  . Hyperkalemia     cannot use ACE-I/ARB's/aldactone due to this.  Pt was on patiromer Lelon Perla) --to help him excrete more potassium in feces but this was d/c'd by cardiologist 01/2016.  .  Diabetic neuropathy with neurologic complication (HCC)     Large fiber, L>R; affecting sensation and proprioception/balance   Past Surgical History  Procedure Laterality Date  . Knee arthroscopy  2001    Bilateral  . Amputation  10/24/2012    Procedure: AMPUTATION RAY;  Surgeon: Sherri Rad, MD;  Location: [facility];  Service: Orthopedics;  Laterality: Left;  left 4th toe amputation through MTP joint, 5th Ray amputation   . Knee surgery  2001  . Hida scan  06/2014    Normal  . Transthoracic echocardiogram  07/19/14;12/2014;03/2015    Mild LV dilation and hypertrophy, EF 35-40%, no wall motion abnormalities, grade II diast dysfxn, bicuspid aortic valve with mild AS and mild AI.  No change on 12/2014 echo.  03/2015 EF improved to 40-45%, moderate AS/bicuspid aortic valve  . Cardiovascular  stress test  07/2014    Myoview w/out ischemia but large scar (inferior/apex) w/ global hypokinesis--medical mgmt  . Eye surgery  08/2014    Retinal hemorrhage evacuation  . Coronary angioplasty with stent placement  09/2014    DES to mid LAD and RCA; EF 40% by cardiac MRI  . Cardiac mri  09/2014    Moderately dilated LV.  EF 40%, multiple wall motion abnormalities, normal RV fxn.  Some areas of scarring.  . Left heart catheterization with coronary angiogram N/A 10/10/2014    Procedure: LEFT HEART CATHETERIZATION WITH CORONARY ANGIOGRAM;  Surgeon: Peter M Swaziland, MD;  Location: [facility] CATH LAB;  Service: Cardiovascular;  Laterality: N/A;  . Left heart catheterization with coronary angiogram N/A 10/15/2014    Procedure: LEFT HEART CATHETERIZATION WITH CORONARY ANGIOGRAM;  Surgeon: Iran Ouch, MD;  Location: [facility] CATH LAB;  Service: Cardiovascular;  Laterality: N/A;  . Carotid dopplers  04/10/15    1-35% bilat; normal subclavians  . Venous doppler ultrasound Left 01/06/16    NO DVT    History   Social History  . Marital Status: Single    Spouse Name: N/A    Number of Children: 0    Occupational History  . worker   Social History Main Topics  . Smoking status: Never Smoker   . Smokeless tobacco: Current User    Types: Chew  . Alcohol Use: No     Comment: Quit drinking alcohol 08/20/2011  . Drug Use: No   Social History Narrative   Single, no children.  Lives in [place], relocated from [place], [place] in 2008.   Chews tobacco but does not smoke.   Drinks 12 Beers daily---patient did not initially report/admit this--as of 2013 he has quit this.   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   Current Outpatient Prescriptions on File Prior to Visit  Medication Sig Dispense Refill  . albuterol (VENTOLIN HFA) 108 (90 Base) MCG/ACT inhaler Inhale 1-2 puffs into the lungs every 4 (four) hours as needed for wheezing or shortness of breath. 1 Inhaler 0  . aspirin 81 MG chewable tablet Chew 1 tablet (81 mg total) by mouth daily.    [name] atorvastatin (LIPITOR) 20 MG tablet Take 1 tablet (20 mg total) by mouth daily at 6 PM. 30 tablet 11  . carvedilol (COREG) 25 MG tablet Take 0.5 tablets (12.5 mg total) by mouth 2 (two) times daily. 90 tablet 3  . furosemide (LASIX) 40 MG tablet Take 1 tablet (40 mg total) by mouth daily. 30 tablet 3  . gabapentin (NEURONTIN) 300 MG capsule Take 300 mg by mouth at bedtime.  1  . glucose blood (FREESTYLE TEST STRIPS) test strip Use to test blood sugar 3 times daily. Dx:E11.8 100 each 1  . insulin aspart (NOVOLOG) 100 UNIT/ML injection Inject 0-10 Units into the skin 3 (three) times daily before meals. Sliding scale 10 mL 1  . Insulin Degludec (TRESIBA FLEXTOUCH) 100 UNIT/ML SOPN Inject 18 Units into the skin at bedtime. 5 pen 1  . nitroGLYCERIN (NITROSTAT) 0.4 MG SL tablet Place 1 tablet (0.4 mg total) under the tongue every 5 (five) minutes as needed for chest pain. 90 tablet 3  . ondansetron (ZOFRAN) 4 MG tablet Take 1 tablet (4 mg total) by mouth every 8 (eight) hours as needed for nausea or vomiting. 20 tablet 0   . patiromer (VELTASSA) 8.4 g packet Take 1 packet (8.4 g total) by mouth daily. 30  packet 6   No current facility-administered medications on file prior to visit.   Allergies  Allergen Reactions  . Iodine Other (See Comments)    Family history of anaphylaxis, unsure if patient has reaction  . Other Shortness Of Breath, Itching and Other (See Comments)    PERFUMES - SNEEZING  . Hydralazine Hcl Hives  . Naproxen Nausea And Vomiting   Family History  Problem Relation Age of Onset  . Heart disease Father     Deceased  . Diabetes type II Sister   . Other Mother     Septicemia   PE: BP 132/74 mmHg  Pulse 60  Temp(Src) 97.5 F (36.4 C) (Oral)  Resp 12  Wt 255 lb (115.667 kg)  SpO2 97% Body mass index is 36.59 kg/(m^2).  Wt Readings from Last 3 Encounters:  03/11/16 255 lb (115.667 kg)  02/04/16 243 lb (110.224 kg)  01/29/16 238 lb 4 oz (108.069 kg)   Constitutional: overweight, in NAD Eyes: PERRLA, EOMI, no exophthalmos ENT: moist mucous membranes, no thyromegaly, no cervical lymphadenopathy Cardiovascular: RRR, No MRG Respiratory: CTA B Gastrointestinal: abdomen soft, NT, ND, BS+ Musculoskeletal: no deformities, strength intact in all 4 - pain and tension L thigh Skin: moist, warm,+ stasis dermatitis Neurological: no tremor with outstretched hands, DTR normal in all 4  ASSESSMENT: 1. DM2, insulin-dependent, controlled, with complications - CRI stage 2 - Cardiomyopathy - PN - DR - L 4 and 5th toes amputated 2/2 osteomyelitis - 09/2012  PLAN:  1. Patient with long-standing, controlled diabetes, on basal-bolus regimen, with previously good CBG control. At last visit, as sugars were lower after exercise and higher before dinner >> we decreased am mealtime insulin and increased insulin with lunch >> however, sugars are higher as he is less active >> stopped PT. - We will continue Tresiba  instead of Lantus >> will increase the dose:  Patient Instructions  Please  increase: - Tresiba to 22 units at bedtime.  Continue: - NovoLog at mealtime: - 6 units before breakfast - 8-10 units before lunch - 8-10 units before dinner  Please come back for a follow-up appointment in 3 months with your sugar log.  - continue checking sugars at different times of the day - check 2 times a day, rotating checks - advised for yearly eye exams >> he is up to date - will check HbA1c today >> 8.2% (higher!) - Return to clinic in 3 mo with sugar log

## 2016-03-11 NOTE — Patient Instructions (Addendum)
Patient Instructions  Please increase: - Tresiba to 22 units at bedtime.  Continue: - NovoLog at mealtime: - 6 units before breakfast - 8-10 units before lunch - 8-10 units before dinner  Please come back for a follow-up appointment in 3 months with your sugar log.

## 2016-03-23 ENCOUNTER — Encounter: Payer: Self-pay | Admitting: *Deleted

## 2016-03-23 ENCOUNTER — Encounter: Payer: Self-pay | Admitting: Family Medicine

## 2016-03-23 ENCOUNTER — Ambulatory Visit (INDEPENDENT_AMBULATORY_CARE_PROVIDER_SITE_OTHER): Payer: 59 | Admitting: Family Medicine

## 2016-03-23 VITALS — BP 199/94 | HR 89 | Temp 102.3°F | Resp 16 | Ht 70.0 in | Wt 257.8 lb

## 2016-03-23 DIAGNOSIS — J111 Influenza due to unidentified influenza virus with other respiratory manifestations: Secondary | ICD-10-CM | POA: Diagnosis not present

## 2016-03-23 DIAGNOSIS — R69 Illness, unspecified: Principal | ICD-10-CM

## 2016-03-23 DIAGNOSIS — R609 Edema, unspecified: Secondary | ICD-10-CM | POA: Diagnosis not present

## 2016-03-23 MED ORDER — HYDROCODONE-HOMATROPINE 5-1.5 MG/5ML PO SYRP: ORAL_SOLUTION | ORAL | Status: DC

## 2016-03-23 MED ORDER — IPRATROPIUM BROMIDE 0.03 % NA SOLN: 2.0000 | Freq: Two times a day (BID) | NASAL | Status: DC

## 2016-03-23 MED ORDER — FUROSEMIDE 40 MG PO TABS: 40.0000 mg | ORAL_TABLET | Freq: Two times a day (BID) | ORAL | Status: DC

## 2016-03-23 MED ORDER — OSELTAMIVIR PHOSPHATE 75 MG PO CAPS: 75.0000 mg | ORAL_CAPSULE | Freq: Two times a day (BID) | ORAL | Status: DC

## 2016-03-23 NOTE — Patient Instructions (Signed)
Get otc generic robitussin DM OR Mucinex DM and use as directed on the packaging for cough and congestion. Use otc generic saline nasal spray 2-3 times per day to irrigate/moisturize your nasal passages.   

## 2016-03-23 NOTE — Progress Notes (Signed)
Pre visit review using our clinic review tool, if applicable. No additional management support is needed unless otherwise documented below in the visit note. 

## 2016-03-23 NOTE — Progress Notes (Signed)
OFFICE VISIT  03/23/2016   CC:  Chief Complaint  Patient presents with  . URI    x 1 day   HPI:    Patient is a 55 y.o. Caucasian male who presents for profuse runny nose, sneezing, vomited once due to excessive PND causing upset stomach today.  Sx's onset last night.  Felt feverish this afternoon and it was 99.8 at work.  Coughing a lot.  No ST.  +HA hurts a lot.  Woozy if moves too quick.  Achy all over.  Chest hurts from coughing, feels some SOB when going up stairs.  Lower legs have swollen significantly since last night.   People at work with similar sx's recently.    Past Medical History  Diagnosis Date  . Heart murmur     ECHO 07/2011 showed mild AS and LVH.  TEE 09/2011 showed small PFO, bicuspid aortic valve but no stenosis or regurg.  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  Gen surg and GI recommended flagyl and cipro x 2 wks..  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  . Cholelithiasis     u/s--no cholecystitis.  06/2014 u/s showed no stones, only GB sludge  . Peripheral neuropathy (HCC)     Diabetic:  Autonomic (pelvic) and LE polyneuropathy--WFUB testing showed that it is likely from DM.  Followed by Dr. Allena Katz with [facility] as of 09/2015  . Diabetic foot ulcers (HCC)     Poor healing; normal ABIs and TBIs  . History of pyelonephritis   . Type II or unspecified type diabetes mellitus with neurological manifestations, not stated as uncontrolled     IDDM: neuro, renal, and ophth complications  . Wears dentures     upper  . Osteomyelitis of toe of left foot (HCC)     left 4th and 5th toes--now s/p amputation of these areas  . Diabetic retinopathy associated with type 2 diabetes mellitus (HCC)     Laser tx in both eyes (Dr. Allyne Gee): severe nonprolif diab retpthy, diabetic macular edema (05/2015)  .  Diabetic nephropathy (HCC)     Proteinuria and CrCl 55-65  . Chronic renal insufficiency, stage III (moderate)     CrCl 50s-80s 2014 to pre-VFIB arrest 10/02/14, at which time he sustained AKI.  . Lumbar spondylosis     MRI 08/25/11 with multilevel facet dz, small disc protrusion at L5-S1 to the right without impingement; repeat L spine MRI 09/2015 same except L5-S1 disc bulge causing some mass effect on R intraspinal S1 nerve root.  No canal stenosis.  . Ischemic cardiomyopathy 07/22/14    Myoview w/out ischemia but large scar (inferior/apex) w/ global hypokinesis--medical mgmt  . History of pancreatitis 2014 and 2015    Suspected biliary: passing small gallstones: surgery declined to remove GB 07/2014.  . Aspiration pneumonia (HCC)   . Thrombocytopenia (HCC)   . Portal hypertension (HCC)   . Liver cirrhosis secondary to NASH     ?+ alcohol?.  With hx of splenomegaly and ascities (dx approx 2012)  . History of gram negative sepsis 2015    e coli  . Ventricular fibrillation (HCC) 10/02/14    Resuscitated, hospitalized, 3 V CAD detected, DES stent to mid LAD and to RCA were placed.  . Peripheral edema     chronic asymmetric LE edema (L>>R)  . OSA (obstructive  sleep apnea) 11/15/2015    Moderate with AHI 16/hr  . Hyperkalemia     cannot use ACE-I/ARB's/aldactone due to this.  Pt was on patiromer Lelon Perla) --to help him excrete more potassium in feces but this was d/c'd by cardiologist 01/2016.  . Diabetic neuropathy with neurologic complication (HCC)     Large fiber, L>R; affecting sensation and proprioception/balance    Past Surgical History  Procedure Laterality Date  . Knee arthroscopy  2001    Bilateral  . Amputation  10/24/2012    Procedure: AMPUTATION RAY;  Surgeon: Sherri Rad, MD;  Location:  SUR[facility]ERY CENTER;  Service: Orthopedics;  Laterality: Left;  left 4th toe amputation through MTP joint, 5th Ray amputation   . Knee surgery  2001  . Hida scan  06/2014    Normal   . Transthoracic echocardiogram  07/19/14;12/2014;03/2015    Mild LV dilation and hypertrophy, EF 35-40%, no wall motion abnormalities, grade II diast dysfxn, bicuspid aortic valve with mild AS and mild AI.  No change on 12/2014 echo.  03/2015 EF improved to 40-45%, moderate AS/bicuspid aortic valve  . Cardiovascular stress test  07/2014    Myoview w/out ischemia but large scar (inferior/apex) w/ global hypokinesis--medical mgmt  . Eye surgery  08/2014    Retinal hemorrhage evacuation  . Coronary angioplasty with stent placement  09/2014    DES to mid LAD and RCA; EF 40% by cardiac MRI  . Cardiac mri  09/2014    Moderately dilated LV.  EF 40%, multiple wall motion abnormalities, normal RV fxn.  Some areas of scarring.  . Left heart catheterization with coronary angiogram N/A 10/10/2014    Procedure: LEFT HEART CATHETERIZATION WITH CORONARY ANGIOGRAM;  Surgeon: Peter M Swaziland, MD;  [facility] C[facility] LAB;  Service: Cardiovascular;  Laterality: N/A;  . Left heart catheterization with coronary angiogram N/A 10/15/2014    Procedure: LEFT HEART CATHETERIZATION WITH CORONARY ANGIOGRAM;  Surgeon: Iran Ouch, MD;  Location: [facility] CATH LAB;  Service: Cardiovascular;  Laterality: N/A;  . Carotid dopplers  04/10/15    1-35% bilat; normal subclavians  . Venous doppler ultrasound Left 01/06/16    NO DVT     Outpatient Prescriptions Prior to Visit  Medication Sig Dispense Refill  . albuterol (VENTOLIN HFA) 108 (90 Base) MCG/ACT inhaler Inhale 1-2 puffs into the lungs every 4 (four) hours as needed for wheezing or shortness of breath. 1 Inhaler 0  . aspirin 81 MG chewable tablet Chew 1 tablet (81 mg total) by mouth daily.    . carvedilol (COREG) 25 MG tablet Take 0.5 tablets (12.5 mg total) by mouth 2 (two) times daily. 90 tablet 3  . gabapentin (NEURONTIN) 300 MG capsule Take 300 mg by mouth at bedtime.  1  . glucose blood (FREESTYLE TEST STRIPS) test strip Use to test blood sugar 3 times daily. Dx:E11.8 100  each 1  . insulin aspart (NOVOLOG) 100 UNIT/ML injection Inject 0-10 Units into the skin 3 (three) times daily before meals. Sliding scale 10 mL 1  . Insulin Degludec (TRESIBA FLEXTOUCH) 100 UNIT/ML SOPN Inject 22 Units into the skin at bedtime. 5 pen 2  . nitroGLYCERIN (NITROSTAT) 0.4 MG SL tablet Place 1 tablet (0.4 mg total) under the tongue every 5 (five) minutes as needed for chest pain. 90 tablet 3  . patiromer (VELTASSA) 8.4 g packet Take 1 packet (8.4 g total) by mouth daily. 30 packet 6  . furosemide (LASIX) 40 MG tablet Take 1 tablet (40 mg  total) by mouth daily. 30 tablet 3   No facility-administered medications prior to visit.    Allergies  Allergen Reactions  . Iodine Other (See Comments)    Family history of anaphylaxis, unsure if patient has reaction  . Other Shortness Of Breath, Itching and Other (See Comments)    PERFUMES - SNEEZING  . Hydralazine Hcl Hives  . Naproxen Nausea And Vomiting    ROS As per HPI  PE: Blood pressure 199/94, pulse 89, temperature 102.3 F (39.1 C), temperature source Oral, resp. rate 16, height 5\' 10"  (1.778 m), weight 257 lb 12 oz (116.915 kg), SpO2 97 %. VS: noted--normal. Gen: alert, NAD, NONTOXIC APPEARING. HEENT: eyes without injection, drainage, or swelling.  Ears: EACs clear, TMs with normal light reflex and landmarks.  Nose: Profuse clear rhinorrhea, with some dried, crusty exudate adherent to mildly injected mucosa.  No purulent d/c.  No paranasal sinus TTP.  No facial swelling.  Throat and mouth without focal lesion.  No pharyngial swelling, erythema, or exudate.   Neck: supple, no LAD.   LUNGS: CTA bilat, nonlabored resps.   CV: RRR, 3/6 systolic murmur at base, no r/g. EXT: no clubbing or cyanosis.  4+ pitting edema from knees down into feet bilat. SKIN: no rash  LABS:   None today   Chemistry      Component Value Date/Time   NA 138 02/29/2016 1140   K 4.9 02/29/2016 1140   CL 106 02/29/2016 1140   CO2 25 02/29/2016  1140   BUN 23* 02/29/2016 1140   CREATININE 1.62* 02/29/2016 1140   CREATININE 1.61* 01/06/2016 1526      Component Value Date/Time   CALCIUM 8.4* 02/29/2016 1140   ALKPHOS 129* 12/30/2015 0930   AST 33 12/30/2015 0930   ALT 22 12/30/2015 0930   BILITOT 0.5 12/30/2015 0930     IMPRESSION AND PLAN:  Influenza-like illness. He has increased LE edema acutely, either from increased fluid intake last 1 day or from acute cor pulmonale. Increase lasix to 40mg  BID instead of qd for the next 5d: ok to liberalize fluid intake some if febrile. Tamiflu 75mg  bid x 5d. Ipratropium nasal spray 2 sprays each nostril q12h prn. Get otc generic robitussin DM OR Mucinex DM and use as directed on the packaging for cough and congestion. Use otc generic saline nasal spray 2-3 times per day to irrigate/moisturize your nasal passages. Hycodan syrup, 1-2 tsp qhs prn, #120 ml--rx handed to patient today. Work note for 4/6, 4/7, and 4/10, 2017. I'll see him back for recheck 03/28/16.  I'll check BMET at that time.  An After Visit Summary was printed and given to the patient.  FOLLOW UP: Return in about 5 days (around 03/28/2016) for f/u flu/edema.  Signed:  Santiago Bumpers, MD           03/23/2016

## 2016-03-28 ENCOUNTER — Ambulatory Visit (INDEPENDENT_AMBULATORY_CARE_PROVIDER_SITE_OTHER): Payer: 59 | Admitting: Family Medicine

## 2016-03-28 ENCOUNTER — Encounter: Payer: Self-pay | Admitting: Family Medicine

## 2016-03-28 VITALS — BP 138/76 | HR 56 | Temp 97.7°F | Resp 16 | Ht 70.0 in | Wt 246.5 lb

## 2016-03-28 DIAGNOSIS — R69 Illness, unspecified: Secondary | ICD-10-CM

## 2016-03-28 DIAGNOSIS — N189 Chronic kidney disease, unspecified: Secondary | ICD-10-CM | POA: Diagnosis not present

## 2016-03-28 DIAGNOSIS — I10 Essential (primary) hypertension: Secondary | ICD-10-CM | POA: Diagnosis not present

## 2016-03-28 DIAGNOSIS — I5022 Chronic systolic (congestive) heart failure: Secondary | ICD-10-CM | POA: Diagnosis not present

## 2016-03-28 DIAGNOSIS — J111 Influenza due to unidentified influenza virus with other respiratory manifestations: Secondary | ICD-10-CM

## 2016-03-28 DIAGNOSIS — N182 Chronic kidney disease, stage 2 (mild): Secondary | ICD-10-CM

## 2016-03-28 DIAGNOSIS — R609 Edema, unspecified: Secondary | ICD-10-CM

## 2016-03-28 LAB — BASIC METABOLIC PANEL
BUN: 41 mg/dL — AB (ref 6–23)
CALCIUM: 8.1 mg/dL — AB (ref 8.4–10.5)
CHLORIDE: 106 meq/L (ref 96–112)
CO2: 29 meq/L (ref 19–32)
CREATININE: 1.67 mg/dL — AB (ref 0.40–1.50)
GFR: 45.86 mL/min — ABNORMAL LOW (ref >60.00)
Glucose, Bld: 132 mg/dL — ABNORMAL HIGH (ref 70–99)
Potassium: 5.1 mEq/L (ref 3.5–5.1)
Sodium: 139 mEq/L (ref 135–145)

## 2016-03-28 MED ORDER — FUROSEMIDE 40 MG PO TABS: 40.0000 mg | ORAL_TABLET | Freq: Two times a day (BID) | ORAL | Status: DC

## 2016-03-28 NOTE — Progress Notes (Signed)
OFFICE VISIT  03/28/2016   CC:  Chief Complaint  Patient presents with  . Follow-up    Flu and Edema     HPI:    Patient is a 55 y.o. Caucasian male who presents for 5 day f/u influenza-like illness and increased peripheral edema. I put him on tamiflu and increased his lasix from 40mg  qd to 40mg  bid for 5d.  Also rx'd ipratropium nasal spray and rx'd hycodan syrup for hs use.    He is better. Still having some cough and runny nose.  No fevers.  His fatigue is almost resolved. Swelling in legs is a little better.  He has lost 10 lbs since last visit. No otc meds with decongestants in it.  No bp checks in the last week or so.  Prior to this, his bp was 140s 70s, occ 150s systolic.   Past Medical History  Diagnosis Date  . Heart murmur     ECHO 07/2011 showed mild AS and LVH.  TEE 09/2011 showed small PFO, bicuspid aortic valve but no stenosis or regurg.  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  Gen surg and GI recommended flagyl and cipro x 2 wks..  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  . Cholelithiasis     u/s--no cholecystitis.  06/2014 u/s showed no stones, only GB sludge  . Peripheral neuropathy (HCC)     Diabetic:  Autonomic (pelvic) and LE polyneuropathy--[facility]UB testing showed that it is likely from DM.  Followed by Dr. Allena Katz with [facility] as of 09/2015  . Diabetic foot ulcers (HCC)     Poor healing; normal ABIs and TBIs  . History of pyelonephritis   . Type II or unspecified type diabetes mellitus with neurological manifestations, not stated as uncontrolled     IDDM: neuro, renal, and ophth complications  . Wears dentures     upper  . Osteomyelitis of toe of left foot (HCC)     left 4th and 5th toes--now s/p amputation of these areas  . Diabetic retinopathy associated with type 2 diabetes  mellitus (HCC)     Laser tx in both eyes (Dr. Allyne Gee): severe nonprolif diab retpthy, diabetic macular edema (05/2015)  . Diabetic nephropathy (HCC)     Proteinuria and CrCl 55-65  . Chronic renal insufficiency, stage III (moderate)     CrCl 50s-80s 2014 to pre-VFIB arrest 10/02/14, at which time he sustained AKI.  . Lumbar spondylosis     MRI 08/25/11 with multilevel facet dz, small disc protrusion at L5-S1 to the right without impingement; repeat L spine MRI 09/2015 same except L5-S1 disc bulge causing some mass effect on R intraspinal S1 nerve root.  No canal stenosis.  . Ischemic cardiomyopathy 07/22/14    Myoview w/out ischemia but large scar (inferior/apex) w/ global hypokinesis--medical mgmt  . History of pancreatitis 2014 and 2015    Suspected biliary: passing small gallstones: surgery declined to remove GB 07/2014.  . Aspiration pneumonia (HCC)   . Thrombocytopenia (HCC)   . Portal hypertension (HCC)   . Liver cirrhosis secondary to NASH     ?+ alcohol?.  With hx of splenomegaly and ascities (dx approx 2012)  . History of gram negative sepsis 2015    e coli  . Ventricular fibrillation (HCC) 10/02/14    Resuscitated, hospitalized, 3 V CAD detected, DES stent  to mid LAD and to RCA were placed.  . Peripheral edema     chronic asymmetric LE edema (L>>R)  . OSA (obstructive sleep apnea) 11/15/2015    Moderate with AHI 16/hr  . Hyperkalemia     cannot use ACE-I/ARB's/aldactone due to this.  Pt was on patiromer Lelon Perla) --to help him excrete more potassium in feces but this was d/c'd by cardiologist 01/2016.  . Diabetic neuropathy with neurologic complication (HCC)     Large fiber, L>R; affecting sensation and proprioception/balance    Past Surgical History  Procedure Laterality Date  . Knee arthroscopy  2001    Bilateral  . Amputation  10/24/2012    Procedure: AMPUTATION RAY;  Surgeon: Sherri Rad, MD;  Location: [facility];  Service: Orthopedics;  Laterality:  Left;  left 4th toe amputation through MTP joint, 5th Ray amputation   . Knee surgery  2001  . Hida scan  06/2014    Normal  . Transthoracic echocardiogram  07/19/14;12/2014;03/2015    Mild LV dilation and hypertrophy, EF 35-40%, no wall motion abnormalities, grade II diast dysfxn, bicuspid aortic valve with mild AS and mild AI.  No change on 12/2014 echo.  03/2015 EF improved to 40-45%, moderate AS/bicuspid aortic valve  . Cardiovascular stress test  07/2014    Myoview w/out ischemia but large scar (inferior/apex) w/ global hypokinesis--medical mgmt  . Eye surgery  08/2014    Retinal hemorrhage evacuation  . Coronary angioplasty with stent placement  09/2014    DES to mid LAD and RCA; EF 40% by cardiac MRI  . Cardiac mri  09/2014    Moderately dilated LV.  EF 40%, multiple wall motion abnormalities, normal RV fxn.  Some areas of scarring.  . Left heart catheterization with coronary angiogram N/A 10/10/2014    Procedure: LEFT HEART CATHETERIZATION WITH CORONARY ANGIOGRAM;  Surgeon: Peter M Swaziland, MD;  Location: [facility] CATH LAB;  Service: Cardiovascular;  Laterality: N/A;  . Left heart catheterization with coronary angiogram N/A 10/15/2014    Procedure: LEFT HEART CATHETERIZATION WITH CORONARY ANGIOGRAM;  Surgeon: Iran Ouch, MD;  Location: [facility] CATH LAB;  Service: Cardiovascular;  Laterality: N/A;  . Carotid dopplers  04/10/15    1-35% bilat; normal subclavians  . Venous doppler ultrasound Left 01/06/16    NO DVT     Outpatient Prescriptions Prior to Visit  Medication Sig Dispense Refill  . albuterol (VENTOLIN HFA) 108 (90 Base) MCG/ACT inhaler Inhale 1-2 puffs into the lungs every 4 (four) hours as needed for wheezing or shortness of breath. 1 Inhaler 0  . aspirin 81 MG chewable tablet Chew 1 tablet (81 mg total) by mouth daily.    . carvedilol (COREG) 25 MG tablet Take 0.5 tablets (12.5 mg total) by mouth 2 (two) times daily. 90 tablet 3  . furosemide (LASIX) 40 MG tablet Take 1 tablet (40 mg  total) by mouth 2 (two) times daily. 10 tablet 0  . gabapentin (NEURONTIN) 300 MG capsule Take 300 mg by mouth at bedtime.  1  . glucose blood (FREESTYLE TEST STRIPS) test strip Use to test blood sugar 3 times daily. Dx:E11.8 100 each 1  . insulin aspart (NOVOLOG) 100 UNIT/ML injection Inject 0-10 Units into the skin 3 (three) times daily before meals. Sliding scale 10 mL 1  . Insulin Degludec (TRESIBA FLEXTOUCH) 100 UNIT/ML SOPN Inject 22 Units into the skin at bedtime. 5 pen 2  . ipratropium (ATROVENT) 0.03 % nasal spray Place 2 sprays into both  nostrils every 12 (twelve) hours. 30 mL 1  . nitroGLYCERIN (NITROSTAT) 0.4 MG SL tablet Place 1 tablet (0.4 mg total) under the tongue every 5 (five) minutes as needed for chest pain. 90 tablet 3  . oseltamivir (TAMIFLU) 75 MG capsule Take 1 capsule (75 mg total) by mouth 2 (two) times daily. 10 capsule 0  . patiromer (VELTASSA) 8.4 g packet Take 1 packet (8.4 g total) by mouth daily. 30 packet 6  . HYDROcodone-homatropine (HYCODAN) 5-1.5 MG/5ML syrup 1-2 tsp po qhs prn cough (Patient not taking: Reported on 03/28/2016) 120 mL 0   No facility-administered medications prior to visit.    Allergies  Allergen Reactions  . Iodine Other (See Comments)    Family history of anaphylaxis, unsure if patient has reaction  . Other Shortness Of Breath, Itching and Other (See Comments)    PERFUMES - SNEEZING  . Hydralazine Hcl Hives  . Naproxen Nausea And Vomiting    ROS As per HPI  PE: Blood pressure 138/76, pulse 56, temperature 97.7 F (36.5 C), temperature source Oral, resp. rate 16, height  (1.778 m), weight 246 lb 8 oz (111.812 kg), SpO2 100 %. BP repeat 138 systolic Gen: Alert, well appearing.  Patient is oriented to person, place, time, and situation. HYQ:MVHQ: no injection, icteris, swelling, or exudate.  EOMI, PERRLA. Mouth: lips without lesion/swelling.  Oral mucosa pink and moist. Oropharynx without erythema, exudate, or swelling.  CV:  RRR, 2-3/6 systolic murmur heard best at RUSB.   Chest is clear, no wheezing or rales. Normal symmetric air entry throughout both lung fields. No chest wall deformities or tenderness. EXT: 3+ pitting on RLL, 3++ pitting on LLL  LABS:    Chemistry      Component Value Date/Time   NA 138 02/29/2016 1140   K 4.9 02/29/2016 1140   CL 106 02/29/2016 1140   CO2 25 02/29/2016 1140   BUN 23* 02/29/2016 1140   CREATININE 1.62* 02/29/2016 1140   CREATININE 1.61* 01/06/2016 1526      Component Value Date/Time   CALCIUM 8.4* 02/29/2016 1140   ALKPHOS 129* 12/30/2015 0930   AST 33 12/30/2015 0930   ALT 22 12/30/2015 0930   BILITOT 0.5 12/30/2015 0930     IMPRESSION AND PLAN:  1) Influenza-like illness: resolving appropriately. Continue symptomatic care, avoid decongestants.  2) HTN: isolated elevations, no trend of consistently elevated bps so I will not make any changes today. Continue to check daily a work.    3) Peripheral edema: slightly improved on increased lasix dosing. No sign of LV failure. Recheck lytes/cr today and will see if we can try to increase diuretic even more in order to get more fluid off.  An After Visit Summary was printed and given to the patient.  FOLLOW UP: Return in about 1 week (around 04/04/2016) for f/u edema/bp.  Signed:  Santiago Bumpers, MD           03/28/2016

## 2016-03-28 NOTE — Progress Notes (Signed)
Pre visit review using our clinic review tool, if applicable. No additional management support is needed unless otherwise documented below in the visit note. 

## 2016-04-04 ENCOUNTER — Ambulatory Visit (INDEPENDENT_AMBULATORY_CARE_PROVIDER_SITE_OTHER): Payer: 59 | Admitting: Family Medicine

## 2016-04-04 ENCOUNTER — Encounter: Payer: Self-pay | Admitting: Family Medicine

## 2016-04-04 VITALS — BP 168/88 | HR 72 | Temp 98.7°F | Resp 16 | Ht 70.0 in | Wt 244.8 lb

## 2016-04-04 DIAGNOSIS — I10 Essential (primary) hypertension: Secondary | ICD-10-CM | POA: Diagnosis not present

## 2016-04-04 DIAGNOSIS — N183 Chronic kidney disease, stage 3 unspecified: Secondary | ICD-10-CM

## 2016-04-04 DIAGNOSIS — R609 Edema, unspecified: Secondary | ICD-10-CM | POA: Diagnosis not present

## 2016-04-04 MED ORDER — CARVEDILOL 25 MG PO TABS: 12.5000 mg | ORAL_TABLET | Freq: Two times a day (BID) | ORAL | Status: DC

## 2016-04-04 MED ORDER — FUROSEMIDE 40 MG PO TABS: 40.0000 mg | ORAL_TABLET | Freq: Two times a day (BID) | ORAL | Status: DC

## 2016-04-04 NOTE — Progress Notes (Signed)
Pre visit review using our clinic review tool, if applicable. No additional management support is needed unless otherwise documented below in the visit note. 

## 2016-04-04 NOTE — Progress Notes (Signed)
OFFICE VISIT  04/04/2016   CC:  Chief Complaint  Patient presents with  . Follow-up    Edema and BP     HPI:    Patient is a 55 y.o. Caucasian male who presents for 1 week f/u peripheral edema and labile HTN. He is now on a schedule of lasix 40mg  bid.  Feeling ok except lingering runny nose with PND--the last of his recent influenza-like illness. Says abd bloat feeling is nearly gone, also LL's swelling improved but not back to normal. No elevation of legs is being done.  He does walking at work. Denies SOB or CP.  BPs at work 130s-140s/60s-70s, Pulse 70s-80s.  No dizziness, no HAs. We chose to make no changes in med regimen last week b/c he tends to have elevated bps in our office but normal outside of our office.  Past Medical History  Diagnosis Date  . Heart murmur     ECHO 07/2011 showed mild AS and LVH.  TEE 09/2011 showed small PFO, bicuspid aortic valve but no stenosis or regurg.  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  Gen surg and GI recommended flagyl and cipro x 2 wks..  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  . Cholelithiasis     u/s--no cholecystitis.  06/2014 u/s showed no stones, only GB sludge  . Peripheral neuropathy (HCC)     Diabetic:  Autonomic (pelvic) and LE polyneuropathy--[facility] testing showed that it is likely from DM.  Followed by Dr. Allena Katz with  neuro as of 09/2015  . Diabetic foot ulcers (HCC)     Poor healing; normal ABIs and TBIs  . History of pyelonephritis   . Type II or unspecified type diabetes mellitus with neurological manifestations, not stated as uncontrolled     IDDM: neuro, renal, and ophth complications  . Wears dentures     upper  . Osteomyelitis of toe of left foot (HCC)     left 4th and 5th toes--now s/p amputation of these areas  . Diabetic retinopathy  associated with type 2 diabetes mellitus (HCC)     Laser tx in both eyes (Dr. Allyne Gee): severe nonprolif diab retpthy, diabetic macular edema (05/2015)  . Diabetic nephropathy (HCC)     Proteinuria and CrCl 55-65  . Chronic renal insufficiency, stage III (moderate)     CrCl 50s-80s 2014 to pre-VFIB arrest 10/02/14, at which time he sustained AKI.  . Lumbar spondylosis     MRI 08/25/11 with multilevel facet dz, small disc protrusion at L5-S1 to the right without impingement; repeat L spine MRI 09/2015 same except L5-S1 disc bulge causing some mass effect on R intraspinal S1 nerve root.  No canal stenosis.  . Ischemic cardiomyopathy 07/22/14    Myoview w/out ischemia but large scar (inferior/apex) w/ global hypokinesis--medical mgmt  . History of pancreatitis 2014 and 2015    Suspected biliary: passing small gallstones: surgery declined to remove GB 07/2014.  . Aspiration pneumonia (HCC)   . Thrombocytopenia (HCC)   . Portal hypertension (HCC)   . Liver cirrhosis secondary to NASH     ?+ alcohol?.  With hx of splenomegaly and ascities (dx approx 2012)  . History of gram negative sepsis 2015    e coli  . Ventricular fibrillation (HCC) 10/02/14    Resuscitated, hospitalized, 3 V CAD detected, DES stent to mid  LAD and to RCA were placed.  . Peripheral edema     chronic asymmetric LE edema (L>>R)  . OSA (obstructive sleep apnea) 11/15/2015    Moderate with AHI 16/hr  . Hyperkalemia     cannot use ACE-I/ARB's/aldactone due to this.  Pt was on patiromer Lelon Perla) --to help him excrete more potassium in feces but this was d/c'd by cardiologist 01/2016.  . Diabetic neuropathy with neurologic complication (HCC)     Large fiber, L>R; affecting sensation and proprioception/balance    Past Surgical History  Procedure Laterality Date  . Knee arthroscopy  2001    Bilateral  . Amputation  10/24/2012    Procedure: AMPUTATION RAY;  Surgeon: Sherri Rad, MD;  Location: [facility];   Service: Orthopedics;  Laterality: Left;  left 4th toe amputation through MTP joint, 5th Ray amputation   . Knee surgery  2001  . Hida scan  06/2014    Normal  . Transthoracic echocardiogram  07/19/14;12/2014;03/2015    Mild LV dilation and hypertrophy, EF 35-40%, no wall motion abnormalities, grade II diast dysfxn, bicuspid aortic valve with mild AS and mild AI.  No change on 12/2014 echo.  03/2015 EF improved to 40-45%, moderate AS/bicuspid aortic valve  . Cardiovascular stress test  07/2014    Myoview w/out ischemia but large scar (inferior/apex) w/ global hypokinesis--medical mgmt  . Eye surgery  08/2014    Retinal hemorrhage evacuation  . Coronary angioplasty with stent placement  09/2014    DES to mid LAD and RCA; EF 40% by cardiac MRI  . Cardiac mri  09/2014    Moderately dilated LV.  EF 40%, multiple wall motion abnormalities, normal RV fxn.  Some areas of scarring.  . Left heart catheterization with coronary angiogram N/A 10/10/2014    Procedure: LEFT HEART CATHETERIZATION WITH CORONARY ANGIOGRAM;  Surgeon: Peter M Swaziland, MD;  Location: [facility] CATH LAB;  Service: Cardiovascular;  Laterality: N/A;  . Left heart catheterization with coronary angiogram N/A 10/15/2014    Procedure: LEFT HEART CATHETERIZATION WITH CORONARY ANGIOGRAM;  Surgeon: Iran Ouch, MD;  Location: [facility] CATH LAB;  Service: Cardiovascular;  Laterality: N/A;  . Carotid dopplers  04/10/15    1-35% bilat; normal subclavians  . Venous doppler ultrasound Left 01/06/16    NO DVT     Outpatient Prescriptions Prior to Visit  Medication Sig Dispense Refill  . albuterol (VENTOLIN HFA) 108 (90 Base) MCG/ACT inhaler Inhale 1-2 puffs into the lungs every 4 (four) hours as needed for wheezing or shortness of breath. 1 Inhaler 0  . aspirin 81 MG chewable tablet Chew 1 tablet (81 mg total) by mouth daily.    [name] gabapentin (NEURONTIN) 300 MG capsule Take 300 mg by mouth at bedtime.  1  . glucose blood (FREESTYLE TEST STRIPS) test strip Use  to test blood sugar 3 times daily. Dx:E11.8 100 each 1  . insulin aspart (NOVOLOG) 100 UNIT/ML injection Inject 0-10 Units into the skin 3 (three) times daily before meals. Sliding scale 10 mL 1  . Insulin Degludec (TRESIBA FLEXTOUCH) 100 UNIT/ML SOPN Inject 22 Units into the skin at bedtime. 5 pen 2  . ipratropium (ATROVENT) 0.03 % nasal spray Place 2 sprays into both nostrils every 12 (twelve) hours. 30 mL 1  . nitroGLYCERIN (NITROSTAT) 0.4 MG SL tablet Place 1 tablet (0.4 mg total) under the tongue every 5 (five) minutes as needed for chest pain. 90 tablet 3  . patiromer (VELTASSA) 8.4 g packet Take 1  packet (8.4 g total) by mouth daily. 30 packet 6  . carvedilol (COREG) 25 MG tablet Take 0.5 tablets (12.5 mg total) by mouth 2 (two) times daily. 90 tablet 3  . furosemide (LASIX) 40 MG tablet Take 1 tablet (40 mg total) by mouth 2 (two) times daily. 60 tablet 1  . oseltamivir (TAMIFLU) 75 MG capsule Take 1 capsule (75 mg total) by mouth 2 (two) times daily. (Patient not taking: Reported on 04/04/2016) 10 capsule 0   No facility-administered medications prior to visit.    Allergies  Allergen Reactions  . Iodine Other (See Comments)    Family history of anaphylaxis, unsure if patient has reaction  . Other Shortness Of Breath, Itching and Other (See Comments)    PERFUMES - SNEEZING  . Hydralazine Hcl Hives  . Naproxen Nausea And Vomiting    ROS As per HPI  PE: Blood pressure 168/88, pulse 72, temperature 98.7 F (37.1 C), temperature source Oral, resp. rate 16, height  (1.778 m), weight 244 lb 12 oz (111.018 kg), SpO2 95 %. Repeat bp 168/88 Gen: Alert, well appearing.  Patient is oriented to person, place, time, and situation. CV: RRR, 2/6 syst murmur at cardiac base. Chest is clear, no wheezing or rales. Normal symmetric air entry throughout both lung fields. No chest wall deformities or tenderness. ABD: soft, rotund, nontender. EXT: no clubbing or cyanosis.  L leg 3+ edema, R  leg 2+ edema.   LABS:    Chemistry      Component Value Date/Time   NA 139 03/28/2016 1123   K 5.1 03/28/2016 1123   CL 106 03/28/2016 1123   CO2 29 03/28/2016 1123   BUN 41* 03/28/2016 1123   CREATININE 1.67* 03/28/2016 1123   CREATININE 1.61* 01/06/2016 1526      Component Value Date/Time   CALCIUM 8.1* 03/28/2016 1123   ALKPHOS 129* 12/30/2015 0930   AST 33 12/30/2015 0930   ALT 22 12/30/2015 0930   BILITOT 0.5 12/30/2015 0930      IMPRESSION AND PLAN:  1) HTN; white coat component.  No changes made to med regimen today.  Continue monitoring via his employee health clinic.  2) Peripheral edema: this worsened coincidentally with his influenza-like illness 2 weeks ago.  Improving gradually.  Continue lasix 40 mg bid, encouraged pt to elevate legs more.  Continue low Na + fluid restriction.  Recheck BMET today.  3) Chronic renal insufficiency stage III: watching closely as we keep him on a bit higher doses of his lasix than he is normally on.  If GFR stable today, will keep him on lasix  bid as his new baseline diuretic dosing.  An After Visit Summary was printed and given to the patient.  FOLLOW UP: Return for Keep appt with me set for June 2017.  Signed:  Santiago Bumpers, MD           04/04/2016

## 2016-04-05 LAB — BASIC METABOLIC PANEL
BUN: 43 mg/dL — AB (ref 6–23)
CALCIUM: 8.3 mg/dL — AB (ref 8.4–10.5)
CO2: 29 meq/L (ref 19–32)
CREATININE: 1.74 mg/dL — AB (ref 0.40–1.50)
Chloride: 106 mEq/L (ref 96–112)
GFR: 43.73 mL/min — AB (ref >60.00)
GLUCOSE: 161 mg/dL — AB (ref 70–99)
Potassium: 4.9 mEq/L (ref 3.5–5.1)
SODIUM: 139 meq/L (ref 135–145)

## 2016-04-18 ENCOUNTER — Ambulatory Visit: Payer: 59 | Admitting: Cardiology

## 2016-04-19 ENCOUNTER — Encounter: Payer: Self-pay | Admitting: Cardiology

## 2016-04-19 ENCOUNTER — Ambulatory Visit (INDEPENDENT_AMBULATORY_CARE_PROVIDER_SITE_OTHER): Payer: 59 | Admitting: Cardiology

## 2016-04-19 VITALS — BP 130/80 | HR 72 | Ht 71.0 in | Wt 245.1 lb

## 2016-04-19 DIAGNOSIS — G4733 Obstructive sleep apnea (adult) (pediatric): Secondary | ICD-10-CM | POA: Diagnosis not present

## 2016-04-19 DIAGNOSIS — E669 Obesity, unspecified: Secondary | ICD-10-CM

## 2016-04-19 DIAGNOSIS — I10 Essential (primary) hypertension: Secondary | ICD-10-CM | POA: Diagnosis not present

## 2016-04-19 HISTORY — DX: Obesity, unspecified: E66.9

## 2016-04-19 NOTE — Patient Instructions (Signed)

## 2016-04-19 NOTE — Progress Notes (Signed)
Cardiology Office Note    Date:  04/19/2016   ID:  [name], DOB 03-May-1961, MRN [id number]  PCP:  [name], MD  Cardiologist:  Quintella Reichert, MD   Chief Complaint  Patient presents with  . Sleep Apnea  . Hypertension    History of Present Illness:  [name] is a 55 y.o. male who presents today for evaluation of OSA.  He has a history of mild AS, DM and ischemic DCM.  He was referred to the sleep lab due to snoring, witnessed apnea during sleep and elevated Epworth Sleepiness Score of 11. He underwent PSG in 10/2015 showing moderate OSA with AHI 16/hr with O2 sats dropping to 78% during respiratory events.  He underwent CPAP titration but CPAP did not control apneas and he underwent BiPAP titration successfully to 17/13cm H2O.  He is doing well with his PAP device.  He tolerates the full face mask and feels the pressure is adequate. He has a history of broken nose and cannot use a nasal mask due to one side being blocked.   He has difficulty getting to sleep at night due to laying there worrying about his health but when he falls asleep he sleeps well.  Since going on PAP he feels more rested in the am but feels tired in the afternoon.  He also has LE nerve damage and gets sharp pains in his legs.  He no longer snores. He denies any mouth dryness    Past Medical History  Diagnosis Date  . Heart murmur     ECHO 07/2011 showed mild AS and LVH.  TEE 09/2011 showed small PFO, bicuspid aortic valve but no stenosis or regurg.  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  Gen surg and GI recommended flagyl and cipro x 2 wks..  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  . Cholelithiasis     u/s--no cholecystitis.  06/2014 u/s showed no stones, only GB sludge  . Peripheral neuropathy (HCC)     Diabetic:  Autonomic  (pelvic) and LE polyneuropathy--[facility] testing showed that it is likely from DM.  Followed by Dr. Allena Katz with [place] neuro as of 09/2015  . Diabetic foot ulcers (HCC)     Poor healing; normal ABIs and TBIs  . History of pyelonephritis   . Type II or unspecified type diabetes mellitus with neurological manifestations, not stated as uncontrolled     IDDM: neuro, renal, and ophth complications  . Wears dentures     upper  . Osteomyelitis of toe of left foot (HCC)     left 4th and 5th toes--now s/p amputation of these areas  . Diabetic retinopathy associated with type 2 diabetes mellitus (HCC)     Laser tx in both eyes (Dr. Allyne Gee): severe nonprolif diab retpthy, diabetic macular edema (05/2015)  . Diabetic nephropathy (HCC)     Proteinuria and CrCl 55-65  . Chronic renal insufficiency, stage III (moderate)     CrCl 50s-80s 2014 to pre-VFIB arrest 10/02/14, at which time he sustained AKI.  . Lumbar spondylosis     MRI 08/25/11 with multilevel facet dz, small disc protrusion at L5-S1 to the right without impingement; repeat L spine MRI 09/2015 same except L5-S1 disc bulge causing some mass effect on R intraspinal S1 nerve root.  No canal stenosis.  . Ischemic  cardiomyopathy 07/22/14    Systolic + diastolic components.  Myoview w/out ischemia but large scar (inferior/apex) w/ global hypokinesis--medical mgmt  . History of pancreatitis 2014 and 2015    Suspected biliary: passing small gallstones: surgery declined to remove GB 07/2014.  . Aspiration pneumonia (HCC)   . Thrombocytopenia (HCC)   . Portal hypertension (HCC)   . Liver cirrhosis secondary to NASH     ?+ alcohol?.  With hx of splenomegaly and ascities (dx approx 2012)  . History of gram negative sepsis 2015    e coli  . Ventricular fibrillation (HCC) 10/02/14    Resuscitated, hospitalized, 3 V CAD detected, DES stent to mid LAD and to RCA were placed.  . Peripheral edema     chronic asymmetric LE edema (L>>R)  . OSA (obstructive sleep  apnea) 11/15/2015    Moderate with AHI 16/hr  . Hyperkalemia     cannot use ACE-I/ARB's/aldactone due to this.  Pt was on patiromer Lelon Perla) --to help him excrete more potassium in feces but this was d/c'd by cardiologist 01/2016.  . Diabetic neuropathy with neurologic complication (HCC)     Large fiber, L>R; affecting sensation and proprioception/balance  . Obesity (BMI 30-39.9) 04/19/2016    Past Surgical History  Procedure Laterality Date  . Knee arthroscopy  2001    Bilateral  . Amputation  10/24/2012    Procedure: AMPUTATION RAY;  Surgeon: Sherri Rad, MD;  Location: [facility];  Service: Orthopedics;  Laterality: Left;  left 4th toe amputation through MTP joint, 5th Ray amputation   . Knee surgery  2001  . Hida scan  06/2014    Normal  . Transthoracic echocardiogram  07/19/14;12/2014;03/2015    Mild LV dilation and hypertrophy, EF 35-40%, no wall motion abnormalities, grade II diast dysfxn, bicuspid aortic valve with mild AS and mild AI.  No change on 12/2014 echo.  03/2015 EF improved to 40-45%, moderate AS/bicuspid aortic valve  . Cardiovascular stress test  07/2014    Myoview w/out ischemia but large scar (inferior/apex) w/ global hypokinesis--medical mgmt  . Eye surgery  08/2014    Retinal hemorrhage evacuation  . Coronary angioplasty with stent placement  09/2014    DES to mid LAD and RCA; EF 40% by cardiac MRI  . Cardiac mri  09/2014    Moderately dilated LV.  EF 40%, multiple wall motion abnormalities, normal RV fxn.  Some areas of scarring.  . Left heart catheterization with coronary angiogram N/A 10/10/2014    Procedure: LEFT HEART CATHETERIZATION WITH CORONARY ANGIOGRAM;  Surgeon: Peter M Swaziland, MD;  Location: [facility] CATH LAB;  Service: Cardiovascular;  Laterality: N/A;  . Left heart catheterization with coronary angiogram N/A 10/15/2014    Procedure: LEFT HEART CATHETERIZATION WITH CORONARY ANGIOGRAM;  Surgeon: Iran Ouch, MD;  Location: [facility] CATH LAB;   Service: Cardiovascular;  Laterality: N/A;  . Carotid dopplers  04/10/15    1-35% bilat; normal subclavians  . Venous doppler ultrasound Left 01/06/16    NO DVT     Current Medications: Outpatient Prescriptions Prior to Visit  Medication Sig Dispense Refill  . albuterol (VENTOLIN HFA) 108 (90 Base) MCG/ACT inhaler Inhale 1-2 puffs into the lungs every 4 (four) hours as needed for wheezing or shortness of breath. 1 Inhaler 0  . aspirin 81 MG chewable tablet Chew 1 tablet (81 mg total) by mouth daily.    . carvedilol (COREG) 25 MG tablet Take 0.5 tablets (12.5 mg total) by mouth 2 (two) times  daily. 90 tablet 3  . furosemide (LASIX) 40 MG tablet Take 1 tablet (40 mg total) by mouth 2 (two) times daily. 60 tablet 1  . gabapentin (NEURONTIN) 300 MG capsule Take 300 mg by mouth at bedtime.  1  . glucose blood (FREESTYLE TEST STRIPS) test strip Use to test blood sugar 3 times daily. Dx:E11.8 100 each 1  . insulin aspart (NOVOLOG) 100 UNIT/ML injection Inject 0-10 Units into the skin 3 (three) times daily before meals. Sliding scale 10 mL 1  . Insulin Degludec (TRESIBA FLEXTOUCH) 100 UNIT/ML SOPN Inject 22 Units into the skin at bedtime. 5 pen 2  . ipratropium (ATROVENT) 0.03 % nasal spray Place 2 sprays into both nostrils every 12 (twelve) hours. 30 mL 1  . nitroGLYCERIN (NITROSTAT) 0.4 MG SL tablet Place 1 tablet (0.4 mg total) under the tongue every 5 (five) minutes as needed for chest pain. 90 tablet 3  . patiromer (VELTASSA) 8.4 g packet Take 1 packet (8.4 g total) by mouth daily. 30 packet 6   No facility-administered medications prior to visit.     Allergies:   Iodine; Other; Hydralazine hcl; and Naproxen   Social History   Social History  . Marital Status: Single    Spouse Name: N/A  . Number of Children: N/A  . Years of Education: N/A   Social History Main Topics  . Smoking status: Never Smoker   . Smokeless tobacco: Current User    Types: Chew     Comment: occasional 1 can per  2 weeks  . Alcohol Use: No     Comment: Quit drinking alcohol 08/20/2011  . Drug Use: No  . Sexual Activity: Not Asked   Other Topics Concern  . None   Social History Narrative   Single, no children.  Lives in [place], relocated from [place], [place] in 2008.   Chews tobacco but does not smoke.   Drinks 12 Beers daily---patient did not initially report/admit this--as of 2013 he has quit this.   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   Lives at home with his sister and handicapped brother.      Family History:  The patient's family history includes Diabetes type II in his sister; Heart disease in his father; Other in his mother.   ROS:   Please see the history of present illness.    ROS All other systems reviewed and are negative.   PHYSICAL EXAM:   VS:  BP 130/80 mmHg  Pulse 72  Ht 5\' 11"  (1.803 m)  Wt 245 lb 1.9 oz (111.186 kg)  BMI 34.20 kg/m2   GEN: Well nourished, well developed, in no acute distress HEENT: normal Neck: no JVD, carotid bruits, or masses Cardiac: RRR; no murmurs, rubs, or gallops,no edema.  Intact distal pulses bilaterally.  Respiratory:  clear to auscultation bilaterally, normal work of breathing GI: soft, nontender, nondistended, + BS MS: no deformity or atrophy Skin: warm and dry, no rash Neuro:  Alert and Oriented x 3, Strength and sensation are intact Psych: euthymic mood, full affect  Wt Readings from Last 3 Encounters:  04/19/16 245 lb 1.9 oz (111.186 kg)  04/04/16 244 lb 12 oz (111.018 kg)  03/28/16 246 lb 8 oz (111.812 kg)      Studies/Labs Reviewed:   EKG:  EKG is not ordered today.    Recent Labs: 08/20/2015: B Natriuretic Peptide 320.9* 12/30/2015: ALT 22; Hemoglobin 12.2*; Platelets 99.0* 01/06/2016: Magnesium 1.8 04/04/2016: BUN 43*;  Creatinine, Ser 1.74*; Potassium 4.9; Sodium 139   Lipid Panel    Component Value Date/Time   CHOL 151 01/29/2016 1107   TRIG 160* 02/09/2015 1615   HDL 41  02/09/2015 1615   CHOLHDL 3.2 02/09/2015 1615   VLDL 32 02/09/2015 1615   LDLCALC 59 02/09/2015 1615    Additional studies/ records that were reviewed today include:  PAP download    ASSESSMENT:    1. OSA (obstructive sleep apnea)   2. HTN (hypertension), benign   3. Obesity (BMI 30-39.9)      PLAN:  In order of problems listed above:  OSA - the patient is tolerating PAP therapy well without any problems. The PAP download was reviewed today and showed an AHI of 4.1/hr on 17/13 cm H2O with 83% compliance in using more than 4 hours nightly.  The patient has been using and benefiting from PAP use and will continue to benefit from therapy.  HTN - BP controlled on current therapy.  Continue BB. Obesity - I have encouraged him to get into a routine exercise program and cut back on carbs and portions.   Followup in 1 year with me  Medication Adjustments/Labs and Tests Ordered: Current medicines are reviewed at length with the patient today.  Concerns regarding medicines are outlined above.  Medication changes, Labs and Tests ordered today are listed in the Patient Instructions below.   Ralph Flor, MD  04/19/2016 2:56 PM    [facility] [address] [place], [place], [place]  [address] Phone: [phone]; Fax: [phone]

## 2016-04-25 ENCOUNTER — Encounter: Payer: Self-pay | Admitting: Cardiology

## 2016-04-27 ENCOUNTER — Ambulatory Visit (HOSPITAL_COMMUNITY)
Admission: RE | Admit: 2016-04-27 | Discharge: 2016-04-27 | Disposition: A | Discharge status: Home / Self Care | Payer: 59 | Source: Ambulatory Visit | Attending: Cardiology | Admitting: Cardiology

## 2016-04-27 ENCOUNTER — Encounter (HOSPITAL_COMMUNITY): Payer: Self-pay

## 2016-04-27 VITALS — BP 148/74 | HR 65 | Wt 243.5 lb

## 2016-04-27 DIAGNOSIS — I2510 Atherosclerotic heart disease of native coronary artery without angina pectoris: Secondary | ICD-10-CM | POA: Diagnosis not present

## 2016-04-27 DIAGNOSIS — Z79899 Other long term (current) drug therapy: Secondary | ICD-10-CM | POA: Diagnosis not present

## 2016-04-27 DIAGNOSIS — I6522 Occlusion and stenosis of left carotid artery: Secondary | ICD-10-CM | POA: Insufficient documentation

## 2016-04-27 DIAGNOSIS — E875 Hyperkalemia: Secondary | ICD-10-CM | POA: Insufficient documentation

## 2016-04-27 DIAGNOSIS — Q231 Congenital insufficiency of aortic valve: Secondary | ICD-10-CM | POA: Diagnosis not present

## 2016-04-27 DIAGNOSIS — I5022 Chronic systolic (congestive) heart failure: Secondary | ICD-10-CM | POA: Insufficient documentation

## 2016-04-27 DIAGNOSIS — R161 Splenomegaly, not elsewhere classified: Secondary | ICD-10-CM | POA: Insufficient documentation

## 2016-04-27 DIAGNOSIS — Z7982 Long term (current) use of aspirin: Secondary | ICD-10-CM | POA: Diagnosis not present

## 2016-04-27 DIAGNOSIS — K7460 Unspecified cirrhosis of liver: Secondary | ICD-10-CM | POA: Diagnosis not present

## 2016-04-27 DIAGNOSIS — Z8674 Personal history of sudden cardiac arrest: Secondary | ICD-10-CM | POA: Diagnosis not present

## 2016-04-27 DIAGNOSIS — E1142 Type 2 diabetes mellitus with diabetic polyneuropathy: Secondary | ICD-10-CM | POA: Insufficient documentation

## 2016-04-27 DIAGNOSIS — Z794 Long term (current) use of insulin: Secondary | ICD-10-CM | POA: Insufficient documentation

## 2016-04-27 DIAGNOSIS — Z955 Presence of coronary angioplasty implant and graft: Secondary | ICD-10-CM | POA: Diagnosis not present

## 2016-04-27 DIAGNOSIS — I255 Ischemic cardiomyopathy: Secondary | ICD-10-CM | POA: Insufficient documentation

## 2016-04-27 DIAGNOSIS — D696 Thrombocytopenia, unspecified: Secondary | ICD-10-CM | POA: Diagnosis not present

## 2016-04-27 DIAGNOSIS — G4733 Obstructive sleep apnea (adult) (pediatric): Secondary | ICD-10-CM | POA: Insufficient documentation

## 2016-04-27 DIAGNOSIS — M25519 Pain in unspecified shoulder: Secondary | ICD-10-CM | POA: Diagnosis not present

## 2016-04-27 LAB — HEPATIC FUNCTION PANEL
ALT: 33 U/L (ref 17–63)
AST: 54 U/L — ABNORMAL HIGH (ref 15–41)
Albumin: 2.3 g/dL — ABNORMAL LOW (ref 3.5–5.0)
Alkaline Phosphatase: 120 U/L (ref 38–126)
BILIRUBIN INDIRECT: 0.2 mg/dL — AB (ref 0.3–0.9)
Bilirubin, Direct: 0.1 mg/dL (ref 0.1–0.5)
TOTAL PROTEIN: 5.1 g/dL — AB (ref 6.5–8.1)
Total Bilirubin: 0.3 mg/dL (ref 0.3–1.2)

## 2016-04-27 MED ORDER — ATORVASTATIN CALCIUM 20 MG PO TABS: 20.0000 mg | ORAL_TABLET | Freq: Every day | ORAL | Status: DC

## 2016-04-27 NOTE — Patient Instructions (Signed)
START Atorvastatin 20mg  tablet once daily.  Routine lab work today. Will notify you of abnormal results, otherwise no news is good news!  Labs in 2 months (lipids and liver function).  Follow up 3 months with Dr. Shirlee Latch.  Do the following things EVERYDAY: 1) Weigh yourself in the morning before breakfast. Write it down and keep it in a log. 2) Take your medicines as prescribed 3) Eat low salt foods-Limit salt (sodium) to 2000 mg per day.  4) Stay as active as you can everyday 5) Limit all fluids for the day to less than 2 liters

## 2016-04-28 NOTE — Progress Notes (Signed)
Patient ID: [name], male   DOB: 08/22/1961, 55 y.o.   MRN: [id number]    Advanced Heart Failure Clinic Note   PCP: Dr. [name] Cardiology: Dr. Shirlee Latch  55 y.o. with history of CAD s/p recent PCI, cirrhosis with thrombocytopenia, cardiac arrest, and ischemic cardiomyopathy.  Patient was admitted in 7/15-8/15 with gallstone pancreatitis.  It was decided not to do a cholecystectomy due to high surgical risk.  He was sent home and went back to work.  In 10/15, he had a cardiac arrest at work and was defibrillated by AED.  He was cooled and recovered well.  Platelets were initially very low but recovered to his baseline in the 60K range.  Cardiac cath was done showing severe 2 vessel disease.  Cardiac MRI was done, showing EF 40% with a mixed viability picture.  With recovery of platelets, he had Xience DES to the LAD and the distal RCA.  He had acute on chronic systolic CHF with volume overload and was diuresed and eventually discharged.  Unfortunately, he was sent home on daily metolazone and developed AKI.  Diuretics were held and this has gradually improved.  Spironolactone and lisinopril were stopped due to hyperkalemia.  However, he was able to restart lisinopril with simultaneous use of patiromer.  He again developed hyperkalemia and lisinopril was stopped again.  The hyperkalemia was persistent so patiromer was continued. Last echo in 1/17 showed EF improved to 50-55%.   He returns today for followup. He is back at work full time.  He is having trouble with this. Equilibrium is still off and he has falls.  Energy level is poor.  No chest pain.  No dyspnea walking on flat ground, mildly winded with a flight of steps.  Bleeds easily.  He is off atorvastatin but not sure why.  BP mildly elevated today but he checks frequently at home and SBP runs 120s-130s.    Labs (11/15): K 4.7 => 7.2, creatinine 1.37 => 2.1 => 2.25 with BUN 94, HCT 26.9, plts 67, P2Y12 241 Labs (11/20/14): K 6.2, creatinine 1.1,  HCT 33.2, plts 45 Labs (12/18/14): K 6.1 creatinine 1.07 off spiro and lisinopril.  Labs (1/16): K 5.2, creatinine 1.12 Labs (2/16): LDL 59, HDL 41, HCT 32.4 Labs (3/16): K 5.4, creatinine 1.27 Labs (4/16): HCT 32.9, plts 47 Labs (5/16): K 5.2, creatinine 1.23, BNP 352 Labs (6/16): hgb 11.8, plts 41  Labs (7/16): K 4.7, creatinine 1.19 Labs (8/16): K 5, creatinine 1.28, hgb 11.8, plts 41 Labs (9/16): K 5, creatinine 1.39, BNP 321 Labs (1/17): K 5 => 5.4, creatinine 1.6 => 1.98, plts 99K, HCT 37.3 Labs (2/17): TC 151 Labs (4/17): K 4.9, creatinine 1.74  PMH: 1. 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  2. Thrombocytopenia: Chronic.  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   3. Gallstone pancreatitis: He did not have cholecystectomy as deemed too high a surgical risk.  4. CKD: With hyperkalemia.  5. 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 Bicuspid aortic valve: Mild to moderate AS with mean gradient 15 mmHg by echo in 1/17.  7. H/o perforated diverticulum.  8. CAD: Cardiac arrest 10/15.  LHC (10/15) with 80-90% proximal ramus stenosis, 90% mLAD, subtotaled distal LAD, 90% D1, 90% dRCA/proximal PDA.  Patient had Xience DES to mLAD, dRCA.  9. Cardiac arrest: 10/15, defibrillated with AED and cooled.  10. Ischemic cardiomyopathy: Echo (8/15) with EF 35-40%, bicuspid aortic valve with mild AS/AI.  Cardiac MRI (10/15)  with EF 40%, regional wall motion abnormalities, normal RV, delayed enhancement pattern suggesting mixed picture of viability. Echo (1/16) with EF 35-40%, hypokinesis of the mid to apical septum and the true apex, bicuspid aortic valve, mild AI.  Echo (4/16) with EF 40-45%, bicuspid aortic valve with moderate AS (mean gradient 25 mmHg).  Echo (1/17) with EF 50-55%, severe LVH, mild to moderate AS mean gradient 15.   11. Carotid dopplers (4/16): Mild plaque 12. Lumbar spine degenerative disc disease.  13. Peripheral  neuropathy 14. OSA: Bipap.  15. Peripheral arterial dopplers normal in 2/17.   SH: Never smoked.  Prior ETOH abuse, now stopped.  Works at [employer] and [employer].  Lives with sister in [place].   FH: No premature CAD.   ROS: All systems reviewed and negative except as per HPI.   Current Outpatient Prescriptions  Medication Sig Dispense Refill  . albuterol (VENTOLIN HFA) 108 (90 Base) MCG/ACT inhaler Inhale 1-2 puffs into the lungs every 4 (four) hours as needed for wheezing or shortness of breath. 1 Inhaler 0  . aspirin 81 MG chewable tablet Chew 1 tablet (81 mg total) by mouth daily.    . carvedilol (COREG) 25 MG tablet Take 0.5 tablets (12.5 mg total) by mouth 2 (two) times daily. 90 tablet 3  . furosemide (LASIX) 40 MG tablet Take 1 tablet (40 mg total) by mouth 2 (two) times daily. 60 tablet 1  . gabapentin (NEURONTIN) 300 MG capsule Take 300 mg by mouth at bedtime.  1  . glucose blood (FREESTYLE TEST STRIPS) test strip Use to test blood sugar 3 times daily. Dx:E11.8 100 each 1  . insulin aspart (NOVOLOG) 100 UNIT/ML injection Inject 0-10 Units into the skin 3 (three) times daily before meals. Sliding scale 10 mL 1  . Insulin Degludec (TRESIBA FLEXTOUCH) 100 UNIT/ML SOPN Inject 22 Units into the skin at bedtime. 5 pen 2  . ipratropium (ATROVENT) 0.03 % nasal spray Place 2 sprays into both nostrils every 12 (twelve) hours. 30 mL 1  . patiromer (VELTASSA) 8.4 g packet Take 1 packet (8.4 g total) by mouth daily. 30 packet 6  . atorvastatin (LIPITOR) 20 MG tablet Take 1 tablet (20 mg total) by mouth daily. 30 tablet 6  . nitroGLYCERIN (NITROSTAT) 0.4 MG SL tablet Place 1 tablet (0.4 mg total) under the tongue every 5 (five) minutes as needed for chest pain. (Patient not taking: Reported on 04/27/2016) 90 tablet 3   No current facility-administered medications for this encounter.   BP 148/74 mmHg  Pulse 65  Wt 243 lb 8 oz (110.451 kg)  SpO2 100%   Wt Readings from Last 3 Encounters:   04/27/16 243 lb 8 oz (110.451 kg)  04/19/16 245 lb 1.9 oz (111.186 kg)  04/04/16 244 lb 12 oz (111.018 kg)     General: NAD Neck: Thick, JVP difficult but does not appear elevated, no thyromegaly or thyroid nodule. Left carotid bruit.  Lungs: CTAB, normal effort CV: Nondisplaced PMI.  Heart regular S1/S2, no S3/S4, 2/6 early SEM RUSB with clear S2.   Abdomen: Soft, NT, ND, no HSM. No bruits or masses. +BS  Skin: Intact without lesions or rashes.  Neurologic: Alert and oriented x 3.  Psych: Normal affect. Extremities: No clubbing or cyanosis. 1+ ankle edema bilaterally. Normal pedal pulses.  HEENT: Normal.   Assessment/Plan: 1. Chronic systolic CHF: Echo 01/18/16 50-55% improved from 03/2015 with EF 40-45%, ischemic cardiomyopathy.  NYHA class II symptoms. Volume status stable, does not appear  volume overloaded.  - Continue Lasix 40 mg daily.  Check BMET today. If K is lower, will try again to stop patiromer.  - No longer on lisinopril with persistent hyperkalemia despite patiromer and EF improved.  - Continue Coreg 12.5 bid.  - Continue to wear graded compression stockings.  2. CAD: Status post Xience DES to LAD and RCA after cardiac arrest.  Now off Plavix. - Needs to restart atorvastatin at previous dose, not sure why he is off.  Lipids/LFTs in 2 months.  - Continue ASA 81.  3. Cirrhosis: Likely related to ETOH, no longer drinking.  Has associated splenomegaly and chronic thrombocytopenia.  4. Thrombocytopenia: Chronic, likely due to splenomegaly. Will need to follow plts/hemoglobin over time closely.     5. DM: Per endocrinology.  6. OSA: Using Bipap. 7. Hyperkalemia: Check BMET today as above, will try to stop patiromer.  8. Carotid bruit: On left, carotid dopplers with mild plaque. Will need follow up in 4/18.  9. Imbalance: May be related to diabetic neuropathy, also has history of L-spine disease.   Follow up 3 months.   Marca Ancona 04/28/2016  Patient seen with PA,  agree with the above note.  He continues to have difficulty working full time. He has ongoing shoulder pain as well as significant issues with balance.  Most recent echo showed improvement in EF to 50-55%.  Given ongoing hyperkalemia, I think that he can stop Veltassa and lisinopril today. BMET/lipids today.   Marca Ancona 04/28/2016

## 2016-05-06 ENCOUNTER — Encounter (HOSPITAL_COMMUNITY): Payer: Self-pay | Admitting: Cardiology

## 2016-05-26 ENCOUNTER — Other Ambulatory Visit: Payer: Self-pay | Admitting: Occupational Medicine

## 2016-05-26 ENCOUNTER — Ambulatory Visit: Payer: Self-pay

## 2016-05-26 DIAGNOSIS — M25512 Pain in left shoulder: Secondary | ICD-10-CM

## 2016-06-03 ENCOUNTER — Ambulatory Visit (INDEPENDENT_AMBULATORY_CARE_PROVIDER_SITE_OTHER): Payer: 59 | Admitting: Family Medicine

## 2016-06-03 ENCOUNTER — Encounter: Payer: Self-pay | Admitting: Family Medicine

## 2016-06-03 VITALS — BP 171/77 | HR 61 | Temp 97.9°F | Resp 16 | Ht 71.0 in | Wt 232.0 lb

## 2016-06-03 DIAGNOSIS — K766 Portal hypertension: Secondary | ICD-10-CM

## 2016-06-03 DIAGNOSIS — I10 Essential (primary) hypertension: Secondary | ICD-10-CM

## 2016-06-03 DIAGNOSIS — D696 Thrombocytopenia, unspecified: Secondary | ICD-10-CM | POA: Diagnosis not present

## 2016-06-03 DIAGNOSIS — I255 Ischemic cardiomyopathy: Secondary | ICD-10-CM

## 2016-06-03 DIAGNOSIS — K7581 Nonalcoholic steatohepatitis (NASH): Secondary | ICD-10-CM | POA: Diagnosis not present

## 2016-06-03 LAB — CBC
HCT: 35.2 % — ABNORMAL LOW (ref 39.0–52.0)
Hemoglobin: 11.8 g/dL — ABNORMAL LOW (ref 13.0–17.0)
MCHC: 33.6 g/dL (ref 30.0–36.0)
MCV: 89.8 fl (ref 78.0–100.0)
PLATELETS: 59 10*3/uL — AB (ref 150.0–400.0)
RBC: 3.92 Mil/uL — ABNORMAL LOW (ref 4.22–5.81)
RDW: 13.6 % (ref 11.5–15.5)
WBC: 4.9 10*3/uL (ref 4.0–10.5)

## 2016-06-03 MED ORDER — FUROSEMIDE 40 MG PO TABS: 40.0000 mg | ORAL_TABLET | Freq: Two times a day (BID) | ORAL | Status: DC

## 2016-06-03 MED ORDER — GABAPENTIN 300 MG PO CAPS: 300.0000 mg | ORAL_CAPSULE | Freq: Every day | ORAL | Status: DC

## 2016-06-03 NOTE — Progress Notes (Signed)
OFFICE VISIT  06/03/2016   CC:  Chief Complaint  Patient presents with  . Follow-up    Pt is fasting.    HPI:    Patient is a 55 y.o. Caucasian male who presents for 2 mo f/u labile HTN, ischemic CM, CRI stage III, NASH with portal HTN/hx of ascites. Lost his job, feels awful about this.  Worried about his lack of health insurance/lack of benefits short term---"how am I going to get my meds?". Says he feels good otherwise.  Compliant with all meds, saw CHF clinic last on 04/27/16.  No changes made at that time except he was told to restart atorvastatin that he had inadvertently stopped taking.    Home and work bp's normal.  He often has high reading in our office.  No HAs, vision complaints, dizziness, or palpitations.  No CP or SOB.  No melena/hematochezia, nosebleeds, or hematuria.  Appetite is good.  No fevers.  No URI sx's or cough, no wheezing.  Past Medical History  Diagnosis Date  . Heart murmur     ECHO 07/2011 showed mild AS and LVH.  TEE 09/2011 showed small PFO, bicuspid aortic valve but no stenosis or regurg.  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  Gen surg and GI recommended flagyl and cipro x 2 wks..  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  . Cholelithiasis     u/s--no cholecystitis.  06/2014 u/s showed no stones, only GB sludge  . Peripheral neuropathy (HCC)     Diabetic:  Autonomic (pelvic) and LE polyneuropathy--[facility] testing showed that it is likely from DM.  Followed by Dr. Allena Katz with [facility] as of 09/2015  . Diabetic foot ulcers (HCC)     Poor healing; normal ABIs and TBIs  . History of pyelonephritis   . Type II or unspecified type diabetes mellitus with neurological manifestations, not stated as uncontrolled     IDDM: neuro, renal, and ophth complications  . Wears dentures     upper   . Osteomyelitis of toe of left foot (HCC)     left 4th and 5th toes--now s/p amputation of these areas  . Diabetic retinopathy associated with type 2 diabetes mellitus (HCC)     Laser tx in both eyes (Dr. Allyne Gee): severe nonprolif diab retpthy, diabetic macular edema (05/2015)  . Diabetic nephropathy (HCC)     Proteinuria and CrCl 55-65  . Chronic renal insufficiency, stage III (moderate)     CrCl 50s-80s 2014 to pre-VFIB arrest 10/02/14, at which time he sustained AKI.  . Lumbar spondylosis     MRI 08/25/11 with multilevel facet dz, small disc protrusion at L5-S1 to the right without impingement; repeat L spine MRI 09/2015 same except L5-S1 disc bulge causing some mass effect on R intraspinal S1 nerve root.  No canal stenosis.  . Ischemic cardiomyopathy 07/22/14    Systolic + diastolic components.  Myoview w/out ischemia but large scar (inferior/apex) w/ global hypokinesis--medical mgmt  . History of pancreatitis 2014 and 2015    Suspected biliary: passing small gallstones: surgery declined to remove GB 07/2014.  . Aspiration pneumonia (HCC)   . Thrombocytopenia (HCC)   . Portal hypertension (HCC)   . Liver cirrhosis secondary to NASH     ?+ alcohol?.  With hx of splenomegaly and ascities (dx approx 2012)  . History of  gram negative sepsis 2015    e coli  . Ventricular fibrillation (HCC) 10/02/14    Resuscitated, hospitalized, 3 V CAD detected, DES stent to mid LAD and to RCA were placed.  . Peripheral edema     chronic asymmetric LE edema (L>>R)  . OSA (obstructive sleep apnea) 11/15/2015    Moderate with AHI 16/hr  . Hyperkalemia     cannot use ACE-I/ARB's/aldactone due to this.  Pt was on patiromer Lelon Perla) --to help him excrete more potassium in feces but this was d/c'd by cardiologist 01/2016.  . Diabetic neuropathy with neurologic complication (HCC)     Large fiber, L>R; affecting sensation and proprioception/balance  . Obesity (BMI 30-39.9) 04/19/2016    Past Surgical History   Procedure Laterality Date  . Knee arthroscopy  2001    Bilateral  . Amputation  10/24/2012    Procedure: AMPUTATION RAY;  Surgeon: Sherri Rad, MD;  Location: [facility];  Service: Orthopedics;  Laterality: Left;  left 4th toe amputation through MTP joint, 5th Ray amputation   . Knee surgery  2001  . Hida scan  06/2014    Normal  . Transthoracic echocardiogram  07/19/14;12/2014;03/2015    Mild LV dilation and hypertrophy, EF 35-40%, no wall motion abnormalities, grade II diast dysfxn, bicuspid aortic valve with mild AS and mild AI.  No change on 12/2014 echo.  03/2015 EF improved to 40-45%, moderate AS/bicuspid aortic valve  . Cardiovascular stress test  07/2014    Myoview w/out ischemia but large scar (inferior/apex) w/ global hypokinesis--medical mgmt  . Eye surgery  08/2014    Retinal hemorrhage evacuation  . Coronary angioplasty with stent placement  09/2014    DES to mid LAD and RCA; EF 40% by cardiac MRI  . Cardiac mri  09/2014    Moderately dilated LV.  EF 40%, multiple wall motion abnormalities, normal RV fxn.  Some areas of scarring.  . Left heart catheterization with coronary angiogram N/A 10/10/2014    Procedure: LEFT HEART CATHETERIZATION WITH CORONARY ANGIOGRAM;  Surgeon: Peter M Swaziland, MD;  Location: [facility] CATH LAB;  Service: Cardiovascular;  Laterality: N/A;  . Left heart catheterization with coronary angiogram N/A 10/15/2014    Procedure: LEFT HEART CATHETERIZATION WITH CORONARY ANGIOGRAM;  Surgeon: Iran Ouch, MD;  Location: [facility] CATH LAB;  Service: Cardiovascular;  Laterality: N/A;  . Carotid dopplers  04/10/15    1-35% bilat; normal subclavians  . Venous doppler ultrasound Left 01/06/16    NO DVT     Outpatient Prescriptions Prior to Visit  Medication Sig Dispense Refill  . albuterol (VENTOLIN HFA) 108 (90 Base) MCG/ACT inhaler Inhale 1-2 puffs into the lungs every 4 (four) hours as needed for wheezing or shortness of breath. 1 Inhaler 0  . aspirin 81 MG  chewable tablet Chew 1 tablet (81 mg total) by mouth daily.    [name] atorvastatin (LIPITOR) 20 MG tablet Take 1 tablet (20 mg total) by mouth daily. 30 tablet 6  . carvedilol (COREG) 25 MG tablet Take 0.5 tablets (12.5 mg total) by mouth 2 (two) times daily. 90 tablet 3  . glucose blood (FREESTYLE TEST STRIPS) test strip Use to test blood sugar 3 times daily. Dx:E11.8 100 each 1  . insulin aspart (NOVOLOG) 100 UNIT/ML injection Inject 0-10 Units into the skin 3 (three) times daily before meals. Sliding scale 10 mL 1  . Insulin Degludec (TRESIBA FLEXTOUCH) 100 UNIT/ML SOPN Inject 22 Units into the skin at bedtime. 5 pen 2  .  nitroGLYCERIN (NITROSTAT) 0.4 MG SL tablet Place 1 tablet (0.4 mg total) under the tongue every 5 (five) minutes as needed for chest pain. 90 tablet 3  . patiromer (VELTASSA) 8.4 g packet Take 1 packet (8.4 g total) by mouth daily. 30 packet 6  . furosemide (LASIX) 40 MG tablet Take 1 tablet (40 mg total) by mouth 2 (two) times daily. 60 tablet 1  . gabapentin (NEURONTIN) 300 MG capsule Take 300 mg by mouth at bedtime.  1  . ipratropium (ATROVENT) 0.03 % nasal spray Place 2 sprays into both nostrils every 12 (twelve) hours. 30 mL 1   No facility-administered medications prior to visit.    Allergies  Allergen Reactions  . Iodine Other (See Comments)    Family history of anaphylaxis, unsure if patient has reaction  . Other Shortness Of Breath, Itching and Other (See Comments)    PERFUMES - SNEEZING  . Hydralazine Hcl Hives  . Naproxen Nausea And Vomiting    ROS As per HPI  PE: Blood pressure 171/77, pulse 61, temperature 97.9 F (36.6 C), temperature source Oral, resp. rate 16, height 5\' 11"  (1.803 m), weight 232 lb (105.235 kg), SpO2 98 %. Gen: Alert, well appearing.  Patient is oriented to person, place, time, and situation. CV: RRR, 2/6 syst murmur heard best at RUSB, no diastolic murmur.  No rub or gallop. Chest is clear, no wheezing or rales. Normal symmetric air  entry throughout both lung fields. No chest wall deformities or tenderness. EXT: no clubbing or cyanosis.  R leg with 1+ pitting edema, L leg with 2+ pitting edema.  LABS:  Lab Results  Component Value Date   TSH 1.900 07/19/2014   Lab Results  Component Value Date   WBC 5.2 12/30/2015   HGB 12.2* 12/30/2015   HCT 37.3* 12/30/2015   MCV 89.9 12/30/2015   PLT 99.0* 12/30/2015   Lab Results  Component Value Date   CREATININE 1.74* 04/04/2016   BUN 43* 04/04/2016   NA 139 04/04/2016   K 4.9 04/04/2016   CL 106 04/04/2016   CO2 29 04/04/2016   Lab Results  Component Value Date   ALT 33 04/27/2016   AST 54* 04/27/2016   ALKPHOS 120 04/27/2016   BILITOT 0.3 04/27/2016   Lab Results  Component Value Date   CHOL 151 01/29/2016   Lab Results  Component Value Date   HDL 41 02/09/2015   Lab Results  Component Value Date   LDLCALC 59 02/09/2015   Lab Results  Component Value Date   TRIG 160* 02/09/2015   Lab Results  Component Value Date   CHOLHDL 3.2 02/09/2015   Lab Results  Component Value Date   PSA 0.36 08/25/2011    IMPRESSION AND PLAN:  1) HTN; The current medical regimen is effective;  continue present plan and medications. He has a component of white coat HTN. Lytes/renal function stable 1 mo ago.  2) Ischemic CM: no sign of volume overload.  Continue current dosing of lasix, statin, beta blocker. Given his hx of hyperkalemia on ARB/ACE-I he has to avoid these meds.  Continue low Na diet.  3) NASH with portal HTN and hx of ascites: stable. Liver testing was fine 1 mo ago. Hx of thrombocytopenia secondary to portal HTN/splenomegaly--monitor CBC today.  Since patient lost his job, follow up and compliance with medications in the coming months is uncertain.  An After Visit Summary was printed and given to the patient.  FOLLOW UP: Return in about  4 months (around 10/03/2016) for routine chronic illness f/u (30 min).  Signed:  Santiago Bumpers, MD            06/03/2016

## 2016-06-03 NOTE — Progress Notes (Signed)
Pre visit review using our clinic review tool, if applicable. No additional management support is needed unless otherwise documented below in the visit note. 

## 2016-06-08 ENCOUNTER — Telehealth (HOSPITAL_COMMUNITY): Payer: Self-pay

## 2016-06-08 NOTE — Telephone Encounter (Signed)
Returned call to [place] at [facility] to make her aware of this clearance. brandi to fax over form for [place] to complete and send back.  Ave Filter

## 2016-06-08 NOTE — Telephone Encounter (Signed)
Brandi calling to get VO for medical/cardiac clearance for patient to have cataract surgery on 06/14/2016. Will forward to Dr. Shirlee Latch who recently assessed patient during OV.  Ave Filter

## 2016-06-08 NOTE — Telephone Encounter (Signed)
That should be ok 

## 2016-06-09 NOTE — Telephone Encounter (Signed)
Left VM for Brandi pt cleared

## 2016-06-14 ENCOUNTER — Ambulatory Visit: Payer: 59 | Admitting: Internal Medicine

## 2016-06-16 ENCOUNTER — Encounter (HOSPITAL_COMMUNITY): Payer: Self-pay

## 2016-06-16 ENCOUNTER — Ambulatory Visit (HOSPITAL_COMMUNITY)
Admission: RE | Admit: 2016-06-16 | Discharge: 2016-06-16 | Disposition: A | Discharge status: Home / Self Care | Payer: 59 | Source: Ambulatory Visit | Attending: Cardiology | Admitting: Cardiology

## 2016-06-16 VITALS — BP 128/68 | HR 63 | Wt 233.0 lb

## 2016-06-16 DIAGNOSIS — I5022 Chronic systolic (congestive) heart failure: Secondary | ICD-10-CM

## 2016-06-16 DIAGNOSIS — Q231 Congenital insufficiency of aortic valve: Secondary | ICD-10-CM | POA: Diagnosis not present

## 2016-06-16 DIAGNOSIS — N189 Chronic kidney disease, unspecified: Secondary | ICD-10-CM | POA: Diagnosis not present

## 2016-06-16 DIAGNOSIS — I2510 Atherosclerotic heart disease of native coronary artery without angina pectoris: Secondary | ICD-10-CM | POA: Diagnosis not present

## 2016-06-16 DIAGNOSIS — G4733 Obstructive sleep apnea (adult) (pediatric): Secondary | ICD-10-CM | POA: Diagnosis not present

## 2016-06-16 DIAGNOSIS — D696 Thrombocytopenia, unspecified: Secondary | ICD-10-CM | POA: Insufficient documentation

## 2016-06-16 DIAGNOSIS — Z8674 Personal history of sudden cardiac arrest: Secondary | ICD-10-CM | POA: Insufficient documentation

## 2016-06-16 DIAGNOSIS — K7460 Unspecified cirrhosis of liver: Secondary | ICD-10-CM | POA: Insufficient documentation

## 2016-06-16 DIAGNOSIS — R161 Splenomegaly, not elsewhere classified: Secondary | ICD-10-CM | POA: Insufficient documentation

## 2016-06-16 DIAGNOSIS — K7469 Other cirrhosis of liver: Secondary | ICD-10-CM

## 2016-06-16 DIAGNOSIS — E875 Hyperkalemia: Secondary | ICD-10-CM | POA: Diagnosis not present

## 2016-06-16 DIAGNOSIS — Z7982 Long term (current) use of aspirin: Secondary | ICD-10-CM | POA: Diagnosis not present

## 2016-06-16 DIAGNOSIS — I255 Ischemic cardiomyopathy: Secondary | ICD-10-CM | POA: Diagnosis present

## 2016-06-16 DIAGNOSIS — Z794 Long term (current) use of insulin: Secondary | ICD-10-CM | POA: Diagnosis not present

## 2016-06-16 DIAGNOSIS — E1122 Type 2 diabetes mellitus with diabetic chronic kidney disease: Secondary | ICD-10-CM | POA: Insufficient documentation

## 2016-06-16 DIAGNOSIS — I10 Essential (primary) hypertension: Secondary | ICD-10-CM

## 2016-06-16 LAB — COMPREHENSIVE METABOLIC PANEL
ALK PHOS: 132 U/L — AB (ref 38–126)
ALT: 20 U/L (ref 17–63)
AST: 26 U/L (ref 15–41)
Albumin: 2.4 g/dL — ABNORMAL LOW (ref 3.5–5.0)
Anion gap: 6 (ref 5–15)
BUN: 43 mg/dL — AB (ref 6–20)
CALCIUM: 8.5 mg/dL — AB (ref 8.9–10.3)
CO2: 26 mmol/L (ref 22–32)
CREATININE: 2.1 mg/dL — AB (ref 0.61–1.24)
Chloride: 102 mmol/L (ref 101–111)
GFR, EST AFRICAN AMERICAN: 40 mL/min — AB (ref >60)
GFR, EST NON AFRICAN AMERICAN: 34 mL/min — AB (ref >60)
Glucose, Bld: 406 mg/dL — ABNORMAL HIGH (ref 65–99)
Potassium: 5 mmol/L (ref 3.5–5.1)
SODIUM: 134 mmol/L — AB (ref 135–145)
Total Bilirubin: 0.6 mg/dL (ref 0.3–1.2)
Total Protein: 5 g/dL — ABNORMAL LOW (ref 6.5–8.1)

## 2016-06-16 LAB — LIPID PANEL
Cholesterol: 153 mg/dL (ref 0–200)
HDL: 38 mg/dL — ABNORMAL LOW (ref >40)
LDL CALC: 71 mg/dL (ref 0–99)
TRIGLYCERIDES: 219 mg/dL — AB (ref <150)
Total CHOL/HDL Ratio: 4 RATIO
VLDL: 44 mg/dL — ABNORMAL HIGH (ref 0–40)

## 2016-06-16 MED ORDER — ASPIRIN 81 MG PO CHEW: 81.0000 mg | CHEWABLE_TABLET | Freq: Every day | ORAL | Status: AC

## 2016-06-16 MED ORDER — FUROSEMIDE 40 MG PO TABS: 40.0000 mg | ORAL_TABLET | Freq: Two times a day (BID) | ORAL | Status: DC

## 2016-06-16 MED ORDER — CARVEDILOL 25 MG PO TABS: 12.5000 mg | ORAL_TABLET | Freq: Two times a day (BID) | ORAL | Status: DC

## 2016-06-16 MED ORDER — NITROGLYCERIN 0.4 MG SL SUBL: 0.4000 mg | SUBLINGUAL_TABLET | SUBLINGUAL | Status: AC | PRN

## 2016-06-16 MED ORDER — ATORVASTATIN CALCIUM 20 MG PO TABS: 20.0000 mg | ORAL_TABLET | Freq: Every day | ORAL | Status: DC

## 2016-06-16 NOTE — Progress Notes (Signed)
Patient ID: [name], male   DOB: 10-24-1961, 55 y.o.   MRN: [id number]    Advanced Heart Failure Clinic Note   PCP: Dr. [name] Cardiology: Dr. Shirlee Latch  55 y.o. with history of CAD s/p recent PCI, cirrhosis with thrombocytopenia, cardiac arrest, and ischemic cardiomyopathy.  Patient was admitted in 7/15-8/15 with gallstone pancreatitis.  It was decided not to do a cholecystectomy due to high surgical risk.  He was sent home and went back to work.  In 10/15, he had a cardiac arrest at work and was defibrillated by AED.  He was cooled and recovered well.  Platelets were initially very low but recovered to his baseline in the 60K range.  Cardiac cath was done showing severe 2 vessel disease.  Cardiac MRI was done, showing EF 40% with a mixed viability picture.  With recovery of platelets, he had Xience DES to the LAD and the distal RCA.  He had acute on chronic systolic CHF with volume overload and was diuresed and eventually discharged.  Unfortunately, he was sent home on daily metolazone and developed AKI.  Diuretics were held and this has gradually improved.  Spironolactone and lisinopril were stopped due to hyperkalemia.  However, he was able to restart lisinopril with simultaneous use of patiromer.  He again developed hyperkalemia and lisinopril was stopped again.  The hyperkalemia was persistent so patiromer was continued. Last echo in 1/17 showed EF improved to 50-55%.   He returns today for followup. Was let go from work due to his medical problems.  Insurance lapses tomorrow. Has legal counsel and is working on that. Is worried about paying for his medicines. Can't lift as much with his shoulder. Denies dyspnea on flat ground, but still has SOB up stairs.  Has low energy level, runs out quickly. No CP.   Labs (11/15): K 4.7 => 7.2, creatinine 1.37 => 2.1 => 2.25 with BUN 94, HCT 26.9, plts 67, P2Y12 241 Labs (11/20/14): K 6.2, creatinine 1.1, HCT 33.2, plts 45 Labs (12/18/14): K 6.1 creatinine  1.07 off spiro and lisinopril.  Labs (1/16): K 5.2, creatinine 1.12 Labs (2/16): LDL 59, HDL 41, HCT 32.4 Labs (3/16): K 5.4, creatinine 1.27 Labs (4/16): HCT 32.9, plts 47 Labs (5/16): K 5.2, creatinine 1.23, BNP 352 Labs (6/16): hgb 11.8, plts 41  Labs (7/16): K 4.7, creatinine 1.19 Labs (8/16): K 5, creatinine 1.28, hgb 11.8, plts 41 Labs (9/16): K 5, creatinine 1.39, BNP 321 Labs (1/17): K 5 => 5.4, creatinine 1.6 => 1.98, plts 99K, HCT 37.3 Labs (2/17): TC 151 Labs (4/17): K 4.9, creatinine 1.74  PMH: 1. 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  2. Thrombocytopenia: Chronic.  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   3. Gallstone pancreatitis: He did not have cholecystectomy as deemed too high a surgical risk.  4. CKD: With hyperkalemia.  5. 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 Bicuspid aortic valve: Mild to moderate AS with mean gradient 15 mmHg by echo in 1/17.  7. H/o perforated diverticulum.  8. CAD: Cardiac arrest 10/15.  LHC (10/15) with 80-90% proximal ramus stenosis, 90% mLAD, subtotaled distal LAD, 90% D1, 90% dRCA/proximal PDA.  Patient had Xience DES to mLAD, dRCA.  9. Cardiac arrest: 10/15, defibrillated with AED and cooled.  10. Ischemic cardiomyopathy: Echo (8/15) with EF 35-40%, bicuspid aortic valve with mild AS/AI.  Cardiac MRI (10/15) with EF 40%, regional wall motion abnormalities, normal RV, delayed enhancement pattern suggesting mixed picture of  viability. Echo (1/16) with EF 35-40%, hypokinesis of the mid to apical septum and the true apex, bicuspid aortic valve, mild AI.  Echo (4/16) with EF 40-45%, bicuspid aortic valve with moderate AS (mean gradient 25 mmHg).  Echo (1/17) with EF 50-55%, severe LVH, mild to moderate AS mean gradient 15.   11. Carotid dopplers (4/16): Mild plaque 12. Lumbar spine degenerative disc disease.  13. Peripheral neuropathy 14. OSA: Bipap.  15. Peripheral arterial dopplers  normal in 2/17.   SH: Never smoked.  Prior ETOH abuse, now stopped.  Worked at [employer], now has been let go.  Lives with sister in [place].   FH: No premature CAD.   ROS: All systems reviewed and negative except as per HPI.   Current Outpatient Prescriptions  Medication Sig Dispense Refill  . albuterol (VENTOLIN HFA) 108 (90 Base) MCG/ACT inhaler Inhale 1-2 puffs into the lungs every 4 (four) hours as needed for wheezing or shortness of breath. 1 Inhaler 0  . aspirin 81 MG chewable tablet Chew 1 tablet (81 mg total) by mouth daily.    [name] atorvastatin (LIPITOR) 20 MG tablet Take 1 tablet (20 mg total) by mouth daily. 30 tablet 6  . carvedilol (COREG) 25 MG tablet Take 0.5 tablets (12.5 mg total) by mouth 2 (two) times daily. 90 tablet 3  . furosemide (LASIX) 40 MG tablet Take 1 tablet (40 mg total) by mouth 2 (two) times daily. 180 tablet 3  . gabapentin (NEURONTIN) 300 MG capsule Take 1 capsule (300 mg total) by mouth at bedtime. 90 capsule 3  . glucose blood (FREESTYLE TEST STRIPS) test strip Use to test blood sugar 3 times daily. Dx:E11.8 100 each 1  . insulin aspart (NOVOLOG) 100 UNIT/ML injection Inject 0-10 Units into the skin 3 (three) times daily before meals. Sliding scale 10 mL 1  . Insulin Degludec (TRESIBA FLEXTOUCH) 100 UNIT/ML SOPN Inject 22 Units into the skin at bedtime. 5 pen 2  . nitroGLYCERIN (NITROSTAT) 0.4 MG SL tablet Place 1 tablet (0.4 mg total) under the tongue every 5 (five) minutes as needed for chest pain. 90 tablet 3  . patiromer (VELTASSA) 8.4 g packet Take 1 packet (8.4 g total) by mouth daily. 30 packet 6   No current facility-administered medications for this encounter.   BP 128/68 mmHg  Pulse 63  Wt 233 lb (105.688 kg)  SpO2 97%   Wt Readings from Last 3 Encounters:  06/16/16 233 lb (105.688 kg)  06/03/16 232 lb (105.235 kg)  04/27/16 243 lb 8 oz (110.451 kg)     General: NAD Neck: Thick, JVP 6-7 cm, no thyromegaly or thyroid nodule.  Left carotid bruit.  Lungs: CTAB, normal effort CV: Nondisplaced PMI.  Heart regular S1/S2, no S3/S4, 2/6 early SEM RUSB with clear S2.   Abdomen: Soft, non-tender, non-distended, no HSM. No bruits or masses. +BS  Skin: Intact without lesions or rashes.  Neurologic: Alert and oriented x 3.  Psych: Normal affect. Extremities: No clubbing or cyanosis. 1+ ankle edema bilaterally. Normal pedal pulses.  HEENT: Normal.   Assessment/Plan: 1. Chronic systolic CHF: Echo 01/18/16 50-55% improved from 03/2015 with EF 40-45%, ischemic cardiomyopathy.  NYHA class II symptoms. Volume status stable, own 10 lbs from last visit. does not appear volume overloaded.  - Continue Lasix 40 mg daily.  Check CMET today. - No longer on lisinopril with persistent hyperkalemia despite patiromer and EF improved.  - Has still been taking veltassa.  Will stop this (especially  given insurance issues) - Continue Coreg 12.5 bid.  - Continue to wear graded compression stockings.  2. CAD: Status post Xience DES to LAD and RCA after cardiac arrest.  Now off Plavix. - CMET and Lipids today.  - Continue ASA 81.  3. Cirrhosis: Likely related to ETOH, no longer drinking.  Has associated splenomegaly and chronic thrombocytopenia.  4. Thrombocytopenia: Chronic, likely due to splenomegaly. Will need to follow plts/hemoglobin over time closely.     5. DM: Per endocrinology.  - Will need to discuss a more affordable long term regimen with his lapsing insurance. 6. OSA: Using Bipap. 7. Hyperkalemia: - Now off lisinopril.  And stopping veltassa - BMET today.  8. Carotid bruit: On left, carotid dopplers with mild plaque. Will need follow up in 4/18.  9. Imbalance: May be related to diabetic neuropathy, also has history of L-spine disease.   Stable on current regimen.  Labs as above. Will refill meds with 3 month supply and follow up 6 months.   Will have HFSW speak with him about job/insurance situation.  Mariam Dollar  [facility] 06/16/2016

## 2016-06-16 NOTE — Patient Instructions (Signed)
Your physician has recommended you make the following change in your medication:  STOP Veltassa Labs today Your physician recommends that you schedule a follow-up appointment in: 6 months with Dr. Shirlee Latch

## 2016-06-27 ENCOUNTER — Other Ambulatory Visit (HOSPITAL_COMMUNITY): Payer: 59

## 2016-09-15 ENCOUNTER — Other Ambulatory Visit: Payer: Self-pay | Admitting: Family Medicine

## 2016-09-15 MED ORDER — GABAPENTIN 300 MG PO CAPS: 300.0000 mg | ORAL_CAPSULE | Freq: Every day | ORAL | 3 refills | Status: DC

## 2016-09-15 NOTE — Telephone Encounter (Signed)
Patient requesting RF on gabapentin.  Patient has lost job and does not have insurance to come see you.  Can you please Rx.

## 2016-09-15 NOTE — Telephone Encounter (Signed)
Left detailed message on patients phone.  Okay per DPR>  

## 2016-11-30 ENCOUNTER — Telehealth: Payer: Self-pay | Admitting: *Deleted

## 2016-11-30 NOTE — Telephone Encounter (Signed)
We received fax from [facility] requesting medical clearance for FCE.   Per Dr. [name] pt needs 30 min office visit for medical clearance before he can sign form.  SW pt and he stated that this is under workers comp and he needed to contact them to see if they will cover this visit. Pt did not want to schedule apt at this time.

## 2016-12-09 ENCOUNTER — Telehealth (HOSPITAL_COMMUNITY): Payer: Self-pay | Admitting: *Deleted

## 2016-12-09 NOTE — Telephone Encounter (Signed)
Received note from [facility], pt needs clearance for FCE, per Dr Shirlee Latch pt cleared, note faxed back to them at [phone]

## 2016-12-14 ENCOUNTER — Encounter: Payer: Self-pay | Admitting: Family Medicine

## 2016-12-14 ENCOUNTER — Ambulatory Visit (INDEPENDENT_AMBULATORY_CARE_PROVIDER_SITE_OTHER): Payer: 59 | Admitting: Family Medicine

## 2016-12-14 ENCOUNTER — Telehealth: Payer: Self-pay | Admitting: *Deleted

## 2016-12-14 VITALS — BP 172/80 | HR 59 | Temp 97.7°F | Resp 16 | Ht 71.0 in | Wt 229.0 lb

## 2016-12-14 DIAGNOSIS — Z794 Long term (current) use of insulin: Secondary | ICD-10-CM | POA: Diagnosis not present

## 2016-12-14 DIAGNOSIS — E118 Type 2 diabetes mellitus with unspecified complications: Secondary | ICD-10-CM | POA: Diagnosis not present

## 2016-12-14 DIAGNOSIS — N183 Chronic kidney disease, stage 3 unspecified: Secondary | ICD-10-CM

## 2016-12-14 DIAGNOSIS — E1165 Type 2 diabetes mellitus with hyperglycemia: Secondary | ICD-10-CM | POA: Diagnosis not present

## 2016-12-14 DIAGNOSIS — I255 Ischemic cardiomyopathy: Secondary | ICD-10-CM

## 2016-12-14 DIAGNOSIS — K7469 Other cirrhosis of liver: Secondary | ICD-10-CM

## 2016-12-14 DIAGNOSIS — D696 Thrombocytopenia, unspecified: Secondary | ICD-10-CM | POA: Diagnosis not present

## 2016-12-14 DIAGNOSIS — IMO0002 Reserved for concepts with insufficient information to code with codable children: Secondary | ICD-10-CM

## 2016-12-14 DIAGNOSIS — Z8639 Personal history of other endocrine, nutritional and metabolic disease: Secondary | ICD-10-CM

## 2016-12-14 LAB — COMPREHENSIVE METABOLIC PANEL
ALT: 23 U/L (ref 0–53)
AST: 21 U/L (ref 0–37)
Albumin: 3.5 g/dL (ref 3.5–5.2)
Alkaline Phosphatase: 177 U/L — ABNORMAL HIGH (ref 39–117)
BUN: 50 mg/dL — AB (ref 6–23)
CHLORIDE: 97 meq/L (ref 96–112)
CO2: 29 meq/L (ref 19–32)
Calcium: 8.8 mg/dL (ref 8.4–10.5)
Creatinine, Ser: 2.24 mg/dL — ABNORMAL HIGH (ref 0.40–1.50)
GFR: 32.59 mL/min — ABNORMAL LOW (ref >60.00)
GLUCOSE: 427 mg/dL — AB (ref 70–99)
POTASSIUM: 4.8 meq/L (ref 3.5–5.1)
SODIUM: 134 meq/L — AB (ref 135–145)
Total Bilirubin: 0.6 mg/dL (ref 0.2–1.2)
Total Protein: 5.7 g/dL — ABNORMAL LOW (ref 6.0–8.3)

## 2016-12-14 LAB — CBC WITH DIFFERENTIAL/PLATELET
BASOS PCT: 0.6 % (ref 0.0–3.0)
Basophils Absolute: 0 10*3/uL (ref 0.0–0.1)
EOS PCT: 2.8 % (ref 0.0–5.0)
Eosinophils Absolute: 0.1 10*3/uL (ref 0.0–0.7)
HCT: 34.9 % — ABNORMAL LOW (ref 39.0–52.0)
HEMOGLOBIN: 12 g/dL — AB (ref 13.0–17.0)
LYMPHS ABS: 0.6 10*3/uL — AB (ref 0.7–4.0)
Lymphocytes Relative: 12.1 % (ref 12.0–46.0)
MCHC: 34.3 g/dL (ref 30.0–36.0)
MCV: 88.3 fl (ref 78.0–100.0)
MONO ABS: 0.4 10*3/uL (ref 0.1–1.0)
Monocytes Relative: 7.5 % (ref 3.0–12.0)
Neutro Abs: 3.8 10*3/uL (ref 1.4–7.7)
Neutrophils Relative %: 77 % (ref 43.0–77.0)
Platelets: 44 10*3/uL — CL (ref 150.0–400.0)
RBC: 3.95 Mil/uL — ABNORMAL LOW (ref 4.22–5.81)
RDW: 13.1 % (ref 11.5–15.5)
WBC: 4.9 10*3/uL (ref 4.0–10.5)

## 2016-12-14 LAB — HEMOGLOBIN A1C: HEMOGLOBIN A1C: 10.4 % — AB (ref 4.6–6.5)

## 2016-12-14 NOTE — Progress Notes (Signed)
OFFICE VISIT  12/14/2016   CC:  Chief Complaint  Patient presents with  . Follow-up    RCI, pt is not fasting.    HPI:    Patient is a 55 y.o. Caucasian male who presents for assessment in order to medically clear him to proceed with the FCE (functional clearance evaluation) to be performed by a PT at [facility]. Chronic left shoulder pain secondary to torn RC + bone spurs is his only complaint today.  Surgery not an option right now.   Walking on flat surfaces he has no symptoms.  Going up stairs he feels SOB but no CP.   He has no palpitations, dizziness, or vision complaints with activity.  He can squat but it does hurt his knees some and he has trouble standing up from a squatting position.    He has run out of novolog and can't afford to get more. He has been monitoring his glucoses: fasting 170-200.  He says it stays that way all throughout the day.  He takes 22 U of Tresiba at night.  No hypoglycemia.   He says his worker's comp does not allow him to see his specialist, Dr. Elvera Lennox.   Past Medical History:  Diagnosis Date  . Aspiration pneumonia (HCC)   . Cholelithiasis    u/s--no cholecystitis.  06/2014 u/s showed no stones, only GB sludge  . Chronic renal insufficiency, stage III (moderate)    CrCl 50s-80s 2014 to pre-VFIB arrest 10/02/14, at which time he sustained AKI.  . Diabetic foot ulcers (HCC)    Poor healing; normal ABIs and TBIs  . Diabetic nephropathy (HCC)    Proteinuria and CrCl 55-65  . Diabetic neuropathy with neurologic complication (HCC)    Large fiber, L>R; affecting sensation and proprioception/balance  . Diabetic retinopathy associated with type 2 diabetes mellitus (HCC)    Laser tx in both eyes (Dr. Allyne Gee): severe nonprolif diab retpthy, diabetic macular edema (05/2015)  . Heart murmur    ECHO 07/2011 showed mild AS and LVH.  TEE 09/2011 showed small PFO, bicuspid aortic valve but no stenosis or regurg.  [name] History of gram negative sepsis  2015   e coli  . History of pancreatitis 2014 and 2015   Suspected biliary: passing small gallstones: surgery declined to remove GB 07/2014.  [name] History of pyelonephritis   . Hyperkalemia    cannot use ACE-I/ARB's/aldactone due to this.  Pt was on patiromer Lelon Perla) --to help him excrete more potassium in feces but this was d/c'd by cardiologist 01/2016.  . Ischemic cardiomyopathy 07/22/14   Systolic + diastolic components.  Myoview w/out ischemia but large scar (inferior/apex) w/ global hypokinesis--medical mgmt  . Liver cirrhosis secondary to NASH [facility])    ?+ alcohol?.  With hx of splenomegaly and ascities (dx approx 2012)  . Lumbar spondylosis    MRI 08/25/11 with multilevel facet dz, small disc protrusion at L5-S1 to the right without impingement; repeat L spine MRI 09/2015 same except L5-S1 disc bulge causing some mass effect on R intraspinal S1 nerve root.  No canal stenosis.  . Obesity (BMI 30-39.9) 04/19/2016  . OSA (obstructive sleep apnea) 11/15/2015   Moderate with AHI 16/hr  . Osteomyelitis of toe of left foot (HCC)    left 4th and 5th toes--now s/p amputation of these areas  . Perforation of large intestine (HCC)    Presumably from perforated diverticulum:  fistula noted on CT, no abscess (10/05/11).  Gen surg and GI recommended flagyl and cipro  x 2 wks..  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  . Peripheral edema    chronic asymmetric LE edema (L>>R)  . Peripheral neuropathy (HCC)    Diabetic:  Autonomic (pelvic) and LE polyneuropathy--WFUB testing showed that it is likely from DM.  Followed by Dr. Allena Katz with  neuro as of 09/2015  . Portal hypertension (HCC)   . Thrombocytopenia (HCC)   . Type II or unspecified type diabetes mellitus with neurological manifestations, not stated as uncontrolled(250.60)    IDDM: neuro, renal, and ophth complications  . Ventricular fibrillation (HCC) 10/02/14    Resuscitated, hospitalized, 3 V CAD detected, DES stent to mid LAD and to RCA were placed.  . Wears dentures    upper    Past Surgical History:  Procedure Laterality Date  . AMPUTATION  10/24/2012   Procedure: AMPUTATION RAY;  Surgeon: Sherri Rad, MD;  Location: [facility];  Service: Orthopedics;  Laterality: Left;  left 4th toe amputation through MTP joint, 5th Ray amputation   . Cardiac MRI  09/2014   Moderately dilated LV.  EF 40%, multiple wall motion abnormalities, normal RV fxn.  Some areas of scarring.  [name] CARDIOVASCULAR STRESS TEST  07/2014   Myoview w/out ischemia but large scar (inferior/apex) w/ global hypokinesis--medical mgmt  . carotid dopplers  04/10/15   1-35% bilat; normal subclavians  . CORONARY ANGIOPLASTY WITH STENT PLACEMENT  09/2014   DES to mid LAD and RCA; EF 40% by cardiac MRI  . EYE SURGERY  08/2014   Retinal hemorrhage evacuation  . hida scan  06/2014   Normal  . KNEE ARTHROSCOPY  2001   Bilateral  . KNEE SURGERY  2001  . LEFT HEART CATHETERIZATION WITH CORONARY ANGIOGRAM N/A 10/10/2014   Procedure: LEFT HEART CATHETERIZATION WITH CORONARY ANGIOGRAM;  Surgeon: Peter M Swaziland, MD;  Location: [facility] C[facility] LAB;  Service: Cardiovascular;  Laterality: N/A;  . LEFT HEART CATHETERIZATION WITH CORONARY ANGIOGRAM N/A 10/15/2014   Procedure: LEFT HEART CATHETERIZATION WITH CORONARY ANGIOGRAM;  Surgeon: Iran Ouch, MD;  Location: [facility] CATH LAB;  Service: Cardiovascular;  Laterality: N/A;  . TRANSTHORACIC ECHOCARDIOGRAM  07/19/14;12/2014;03/2015   Mild LV dilation and hypertrophy, EF 35-40%, no wall motion abnormalities, grade II diast dysfxn, bicuspid aortic valve with mild AS and mild AI.  No change on 12/2014 echo.  03/2015 EF improved to 40-45%, moderate AS/bicuspid aortic valve  . Venous doppler ultrasound Left 01/06/16   NO DVT     Outpatient Medications Prior to Visit  Medication Sig Dispense Refill  . albuterol (VENTOLIN HFA) 108 (90 Base) MCG/ACT  inhaler Inhale 1-2 puffs into the lungs every 4 (four) hours as needed for wheezing or shortness of breath. 1 Inhaler 0  . aspirin 81 MG chewable tablet Chew 1 tablet (81 mg total) by mouth daily. 90 tablet 3  . atorvastatin (LIPITOR) 20 MG tablet Take 1 tablet (20 mg total) by mouth daily. 90 tablet 3  . carvedilol (COREG) 25 MG tablet Take 0.5 tablets (12.5 mg total) by mouth 2 (two) times daily. 90 tablet 3  . furosemide (LASIX) 40 MG tablet Take 1 tablet (40 mg total) by mouth 2 (two) times daily. 180 tablet 3  . gabapentin (NEURONTIN) 300 MG capsule Take 1 capsule (300 mg total) by mouth at bedtime. 90 capsule 3  . glucose blood (FREESTYLE TEST STRIPS) test strip Use to test blood sugar 3 times daily. Dx:E11.8  100 each 1  . Insulin Degludec (TRESIBA FLEXTOUCH) 100 UNIT/ML SOPN Inject 22 Units into the skin at bedtime. (Patient taking differently: Inject 22 Units into the skin every morning. ) 5 pen 2  . nitroGLYCERIN (NITROSTAT) 0.4 MG SL tablet Place 1 tablet (0.4 mg total) under the tongue every 5 (five) minutes as needed for chest pain. 90 tablet 3  . insulin aspart (NOVOLOG) 100 UNIT/ML injection Inject 0-10 Units into the skin 3 (three) times daily before meals. Sliding scale (Patient not taking: Reported on 12/14/2016) 10 mL 1   No facility-administered medications prior to visit.     Allergies  Allergen Reactions  . Iodine Other (See Comments)    Family history of anaphylaxis, unsure if patient has reaction  . Other Shortness Of Breath, Itching and Other (See Comments)    PERFUMES - SNEEZING  . Hydralazine Hcl Hives  . Naproxen Nausea And Vomiting    ROS As per HPI  PE: Blood pressure (!) 172/80, pulse (!) 59, temperature 97.7 F (36.5 C), temperature source Oral, resp. rate 16, height 5\' 11"  (1.803 m), weight 229 lb (103.9 kg), SpO2 97 %. Gen: Alert, well appearing.  Patient is oriented to person, place, time, and situation. AFFECT: pleasant, lucid thought and speech. CV:  RRR, no m/r/g.   LUNGS: CTA bilat, nonlabored resps, good aeration in all lung fields. ABD: no distention or TTP EXT: no clubbing, cyanosis, or edema.  Strength in extremities is 5/5 prox and dist except for 4/5 prox L UE due to shoulder pain.   L shoulder ROM intact but pain with aBduction, IR, and EF.  NEG drop sign.   LABS:  Lab Results  Component Value Date   TSH 1.900 07/19/2014   Lab Results  Component Value Date   WBC 4.9 06/03/2016   HGB 11.8 (L) 06/03/2016   HCT 35.2 (L) 06/03/2016   MCV 89.8 06/03/2016   PLT 59.0 (L) 06/03/2016   Lab Results  Component Value Date   CREATININE 2.10 (H) 06/16/2016   BUN 43 (H) 06/16/2016   NA 134 (L) 06/16/2016   K 5.0 06/16/2016   CL 102 06/16/2016   CO2 26 06/16/2016   Lab Results  Component Value Date   ALT 20 06/16/2016   AST 26 06/16/2016   ALKPHOS 132 (H) 06/16/2016   BILITOT 0.6 06/16/2016   Lab Results  Component Value Date   CHOL 153 06/16/2016   Lab Results  Component Value Date   HDL 38 (L) 06/16/2016   Lab Results  Component Value Date   LDLCALC 71 06/16/2016   Lab Results  Component Value Date   TRIG 219 (H) 06/16/2016   Lab Results  Component Value Date   CHOLHDL 4.0 06/16/2016   Lab Results  Component Value Date   PSA 0.36 08/25/2011   Lab Results  Component Value Date   HGBA1C 8.2 03/11/2016   IMPRESSION AND PLAN:  1) FCE clearance: I cleared him to participate in this testing.  2) DM 2; poor control.  Financial constraints are an issue.  Unable to see his endocrinologist anymore due to insurance reasons.  Continue current insulin, try harder with diet. Check HbA1c today.  3) Ischemic CM: systolic and diastolic components.  Appears euvolemic.  He is unable to attend CHF clinic due to insurance reasons.  Continue lasix, coreg, ASA, statin.  4) Cirrhosis with portal HTN, secondary to NASH: monitor LFTs and platelets today.  5) CRI, stage III: check lytes/cr today.  6)  Hx of  hyperkalemia: now off ACE/ARB.  Was also taken off of veltassa by CHF clinic 6 mo ago. Checking potassium level today.  An After Visit Summary was printed and given to the patient.  FOLLOW UP: Return in about 4 months (around 04/14/2017) for routine chronic illness f/u.  Signed:  Santiago Bumpers, MD           12/14/2016

## 2016-12-14 NOTE — Progress Notes (Signed)
Pre visit review using our clinic review tool, if applicable. No additional management support is needed unless otherwise documented below in the visit note. 

## 2016-12-14 NOTE — Telephone Encounter (Signed)
[name] with [facility] called with critical lab. Platelet count 44. Please advise. Thanks.

## 2016-12-16 NOTE — Telephone Encounter (Signed)
Noted. See result note attached to lab.

## 2016-12-28 ENCOUNTER — Other Ambulatory Visit: Payer: Self-pay | Admitting: Family Medicine

## 2016-12-28 DIAGNOSIS — D696 Thrombocytopenia, unspecified: Secondary | ICD-10-CM

## 2017-01-17 ENCOUNTER — Other Ambulatory Visit: Payer: Self-pay | Admitting: Family Medicine

## 2017-01-17 ENCOUNTER — Other Ambulatory Visit: Payer: Self-pay | Admitting: *Deleted

## 2017-01-17 ENCOUNTER — Telehealth: Payer: Self-pay | Admitting: Family Medicine

## 2017-01-17 DIAGNOSIS — I5022 Chronic systolic (congestive) heart failure: Secondary | ICD-10-CM

## 2017-01-17 MED ORDER — ATORVASTATIN CALCIUM 20 MG PO TABS: 20.0000 mg | ORAL_TABLET | Freq: Every day | ORAL | 6 refills | Status: AC

## 2017-01-17 MED ORDER — INSULIN NPH ISOPHANE & REGULAR (70-30) 100 UNIT/ML ~~LOC~~ SUSP: SUBCUTANEOUS | 3 refills | Status: AC

## 2017-01-17 MED ORDER — CARVEDILOL 25 MG PO TABS: 12.5000 mg | ORAL_TABLET | Freq: Two times a day (BID) | ORAL | 6 refills | Status: AC

## 2017-01-17 MED ORDER — FUROSEMIDE 40 MG PO TABS: 40.0000 mg | ORAL_TABLET | Freq: Two times a day (BID) | ORAL | 6 refills | Status: AC

## 2017-01-17 MED ORDER — GABAPENTIN 300 MG PO CAPS: 300.0000 mg | ORAL_CAPSULE | Freq: Every day | ORAL | 3 refills | Status: AC

## 2017-01-17 NOTE — Telephone Encounter (Signed)
Relion 70/30 insulin eRx'd per pt's request. Will you eRx syringes and needles for him?  He'll be doing 2 injections per day.-thx

## 2017-01-17 NOTE — Telephone Encounter (Signed)
SW [place] at [facility] and she stated that they will supply pt with needles and syringes, no Rx needed.

## 2017-01-17 NOTE — Telephone Encounter (Signed)
Pt LMOM on 01/17/17 at 9:33am stating that he needs refills for his medications sent to [facility].  SW pt and he wants to use [facility] in [place], updated in chart.   He also asked if Dr. [name] will send in Rx for Relion ([facility] brand of insulin). He stated that he doesn't have insurance and can not afford to see Dr. Elvera Lennox.  RF request for gabapentin LOV: 12/14/16 Next ov: None Last written: 09/15/16 #90 w/ 3RF this Rx was [facility] to [facility].  ____________ Rx's below have been sent to [facility].  RF request for atorvastatin Last written: 06/16/16 #90 w/ 3RF this Rx was sent to [facility].  RF request for carvedilol Last written: 06/16/16 #90 w/ 3RF this Rx was sent to [facility]  RF request for furosemide Last written: 06/16/16 #180 w/ 3RF this Rx was sent to [facility].

## 2017-01-17 NOTE — Telephone Encounter (Signed)
Pt advised and voiced understanding.   

## 2017-02-09 ENCOUNTER — Encounter (HOSPITAL_COMMUNITY): Payer: Self-pay

## 2017-02-09 NOTE — Progress Notes (Signed)
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 Last OV note from 05/2016 (last time patient was seen) faxed to provided #. Copy of request scanned into patient's electronic medical record under media tab for reference.  Ave Filter, RN

## 2017-05-19 ENCOUNTER — Ambulatory Visit: Payer: Self-pay | Admitting: Internal Medicine

## 2017-05-19 DIAGNOSIS — Z0289 Encounter for other administrative examinations: Secondary | ICD-10-CM

## 2017-11-06 ENCOUNTER — Other Ambulatory Visit: Payer: Self-pay | Admitting: Family Medicine

## 2017-11-06 DIAGNOSIS — I5022 Chronic systolic (congestive) heart failure: Secondary | ICD-10-CM

## 2017-11-06 NOTE — Telephone Encounter (Signed)
SW pt, pt has moved to [facility] and has found a new PCP. Pharmacy sent Rx to the wrong provider.

## 2017-11-12 IMAGING — US US EXTREM LOW VENOUS*L*
1 series · 13 of 24 positions shown · non-contrast
Comparison: Left lower extremity venous Doppler ultrasound
performed 12/18/2012

CLINICAL DATA: Acute onset of left leg swelling. Patient on Lasix.
Initial encounter.



[Series 1: us extrem low venous*left* · 0.08mm/px · 25 acquisitions, 13 frames shown]
[im 1/25]
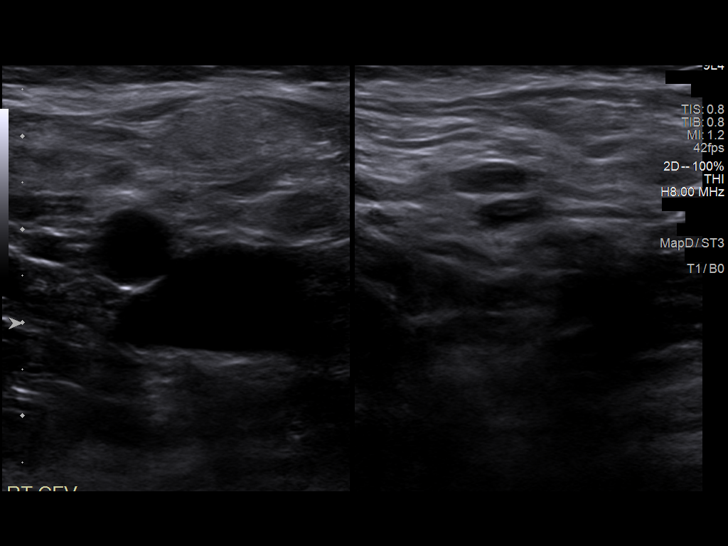
[im 3/25]
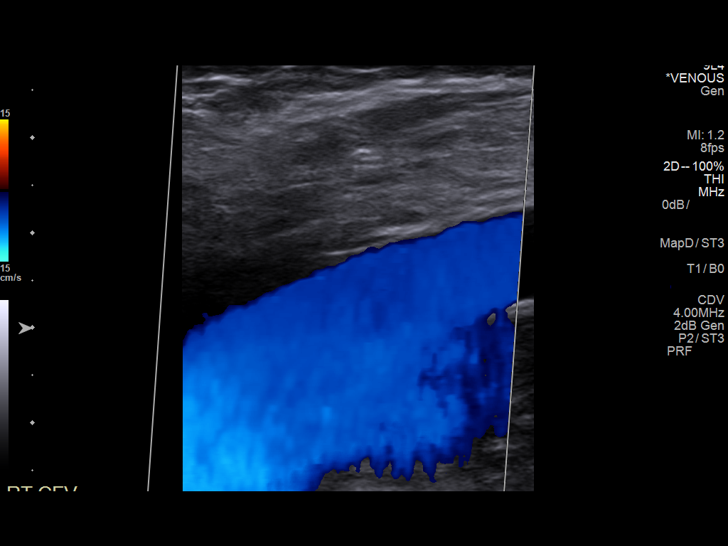
[im 5/25]
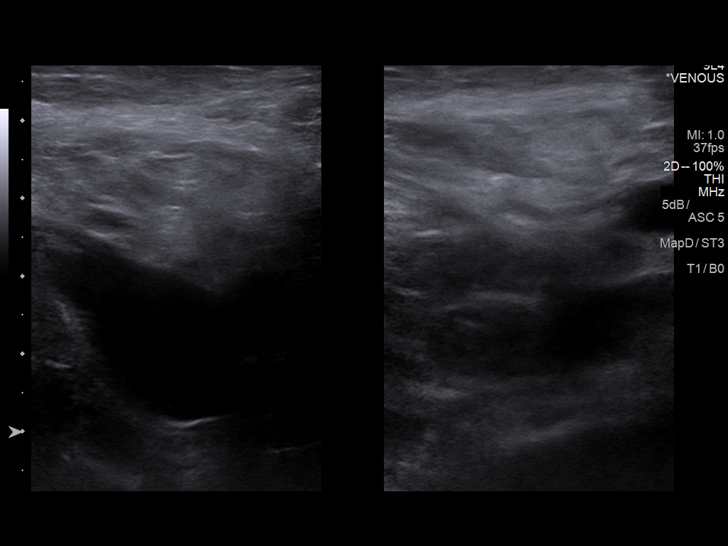
[im 7/25]
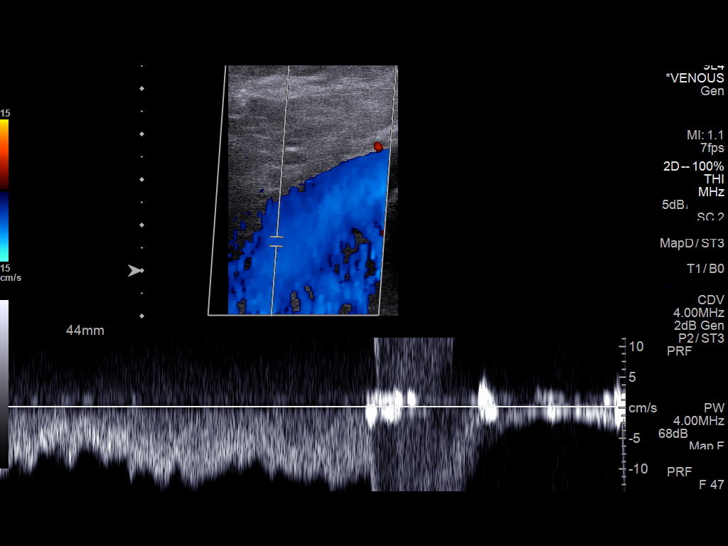
[im 9/25]
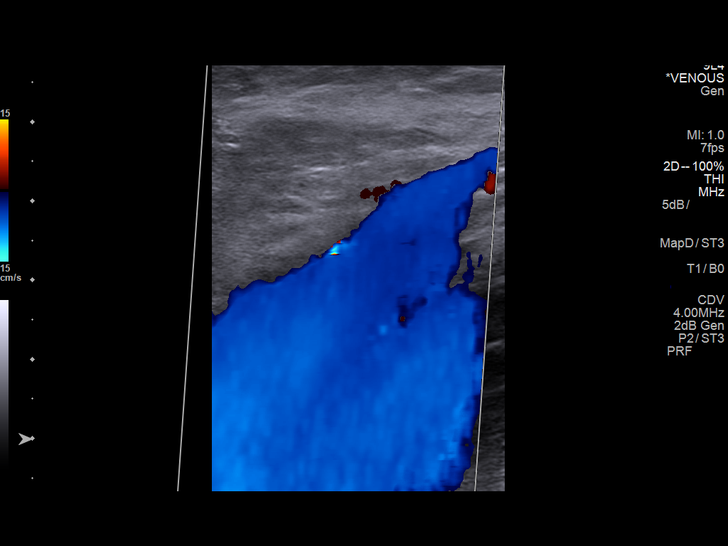
[im 11/25]
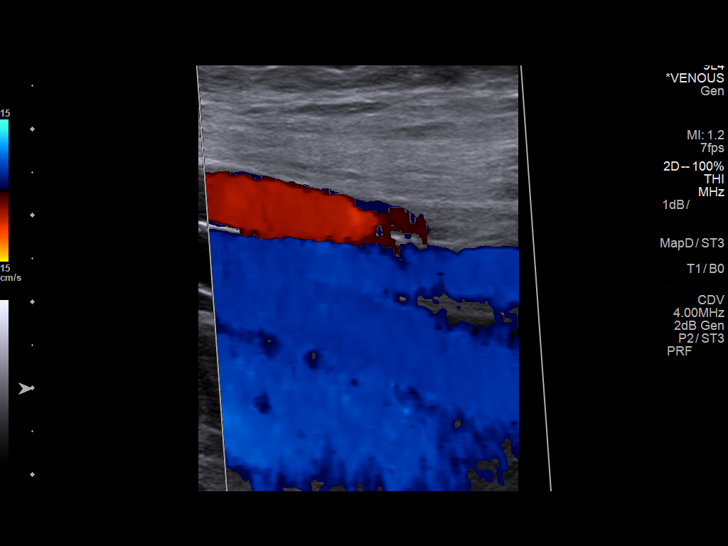
[im 14/25]
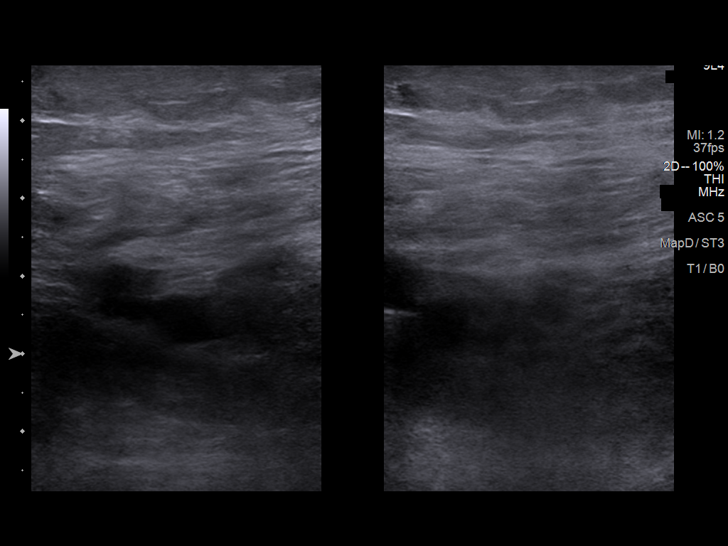
[im 15/25]
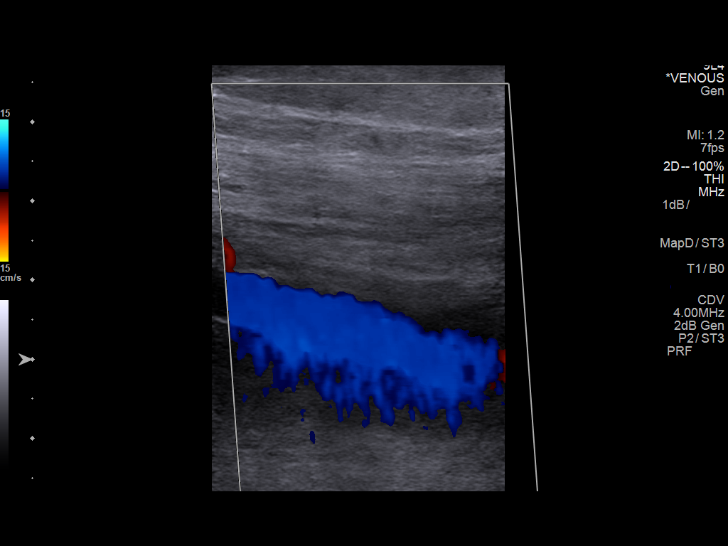
[im 17/25]
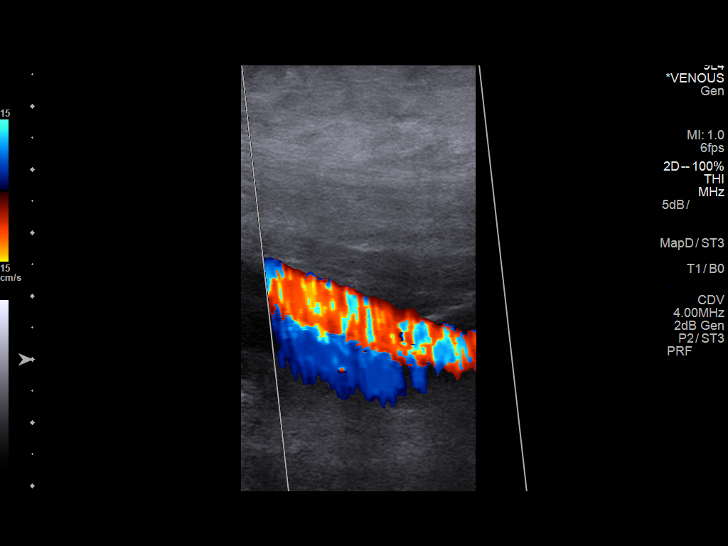
[im 19/25]
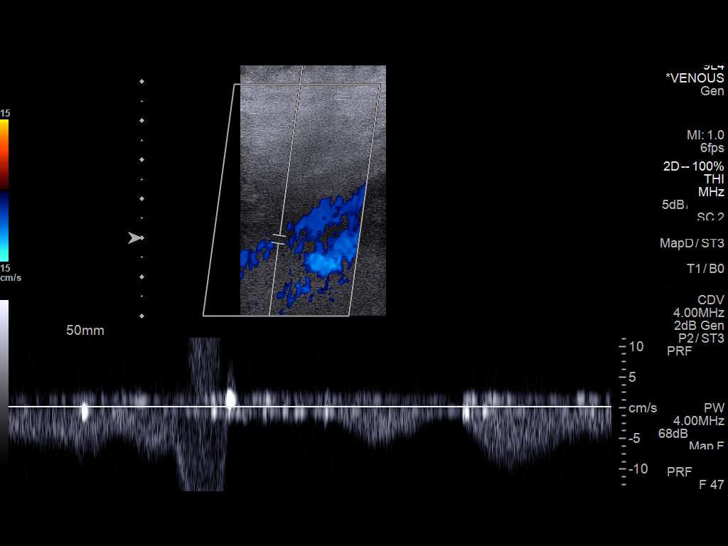
[im 21/25]
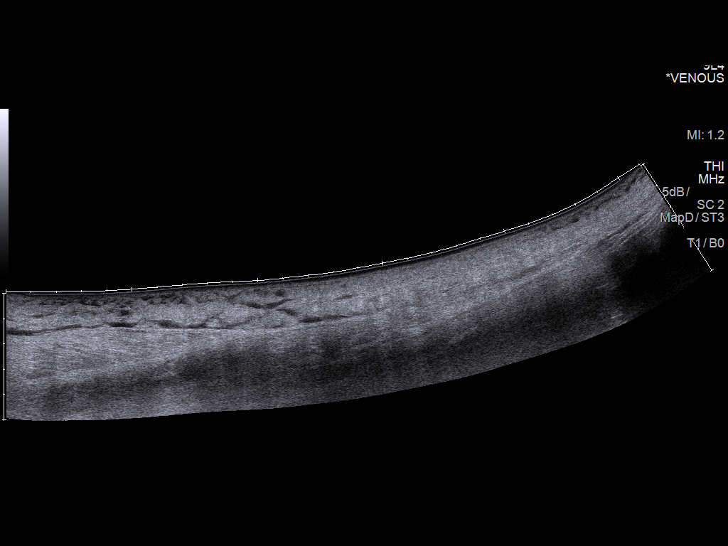
[im 22/25]
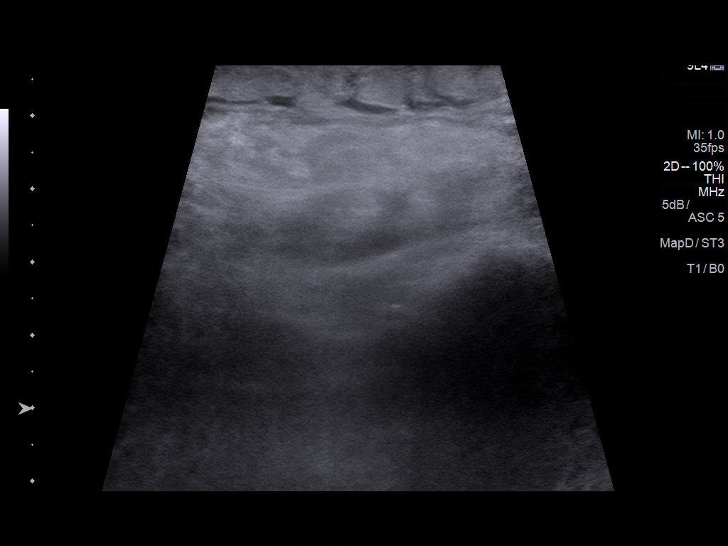
[im 25/25]
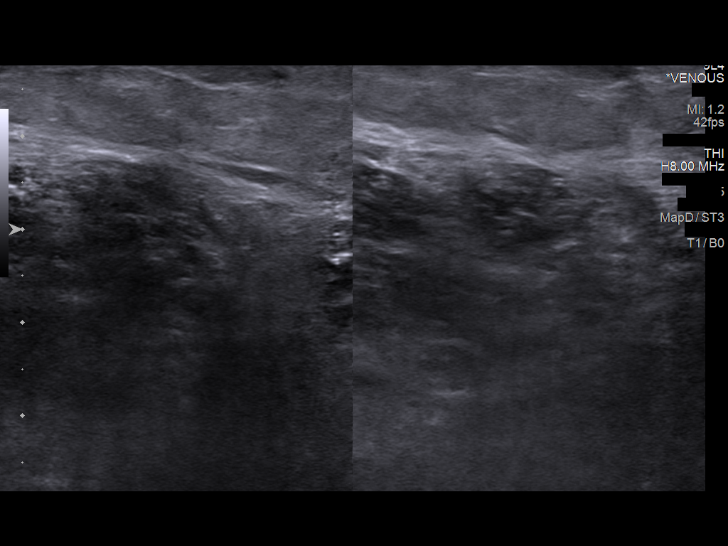

[13 of 24 positions shown; findings below may reference images not displayed]

FINDINGS: Contralateral Common Femoral Vein: Respiratory phasicity is normal
and symmetric with the symptomatic side. No evidence of thrombus.
Normal compressibility.

Common Femoral Vein: No evidence of thrombus. Normal
compressibility, respiratory phasicity and response to augmentation.

Saphenofemoral Junction: No evidence of thrombus. Normal
compressibility and flow on color Doppler imaging.

Profunda Femoral Vein: No evidence of thrombus. Normal
compressibility and flow on color Doppler imaging.

Femoral Vein: No evidence of thrombus. Normal compressibility,
respiratory phasicity and response to augmentation.

Popliteal Vein: No evidence of thrombus. Normal compressibility,
respiratory phasicity and response to augmentation.

Calf Veins: No evidence of thrombus. Normal compressibility and flow
on color Doppler imaging. The peroneal vein is not visualized.

Superficial Great Saphenous Vein: No evidence of thrombus. Normal
compressibility and flow on color Doppler imaging.

Venous Reflux:  None.

Other Findings:  None.
IMPRESSION: No evidence of deep venous thrombosis.

## 2018-02-12 ENCOUNTER — Other Ambulatory Visit: Payer: Self-pay | Admitting: Family Medicine

## 2020-10-19 DEATH — deceased
